# Patient Record
Sex: Female | Born: 1950 | Race: White | Hispanic: No | Smoking: Never smoker
Health system: Southern US, Community
[De-identification: ages and names within clinical notes are randomized; demographics above are authoritative.]

## PROBLEM LIST (undated history)

## (undated) DIAGNOSIS — N390 Urinary tract infection, site not specified: Secondary | ICD-10-CM

## (undated) DIAGNOSIS — E119 Type 2 diabetes mellitus without complications: Secondary | ICD-10-CM

## (undated) DIAGNOSIS — G238 Other specified degenerative diseases of basal ganglia: Secondary | ICD-10-CM

## (undated) DIAGNOSIS — K5731 Diverticulosis of large intestine without perforation or abscess with bleeding: Secondary | ICD-10-CM

## (undated) DIAGNOSIS — I509 Heart failure, unspecified: Secondary | ICD-10-CM

## (undated) DIAGNOSIS — H9190 Unspecified hearing loss, unspecified ear: Secondary | ICD-10-CM

## (undated) DIAGNOSIS — M1990 Unspecified osteoarthritis, unspecified site: Secondary | ICD-10-CM

## (undated) DIAGNOSIS — Z87442 Personal history of urinary calculi: Secondary | ICD-10-CM

## (undated) DIAGNOSIS — N12 Tubulo-interstitial nephritis, not specified as acute or chronic: Secondary | ICD-10-CM

## (undated) DIAGNOSIS — M5030 Other cervical disc degeneration, unspecified cervical region: Secondary | ICD-10-CM

## (undated) DIAGNOSIS — S31809A Unspecified open wound of unspecified buttock, initial encounter: Secondary | ICD-10-CM

## (undated) DIAGNOSIS — K5900 Constipation, unspecified: Secondary | ICD-10-CM

## (undated) DIAGNOSIS — R6521 Severe sepsis with septic shock: Secondary | ICD-10-CM

## (undated) DIAGNOSIS — E785 Hyperlipidemia, unspecified: Secondary | ICD-10-CM

## (undated) DIAGNOSIS — I10 Essential (primary) hypertension: Secondary | ICD-10-CM

## (undated) DIAGNOSIS — G231 Progressive supranuclear ophthalmoplegia [Steele-Richardson-Olszewski]: Secondary | ICD-10-CM

## (undated) DIAGNOSIS — E1142 Type 2 diabetes mellitus with diabetic polyneuropathy: Secondary | ICD-10-CM

## (undated) DIAGNOSIS — Z973 Presence of spectacles and contact lenses: Secondary | ICD-10-CM

## (undated) DIAGNOSIS — F32A Depression, unspecified: Secondary | ICD-10-CM

## (undated) DIAGNOSIS — R202 Paresthesia of skin: Secondary | ICD-10-CM

## (undated) DIAGNOSIS — J9690 Respiratory failure, unspecified, unspecified whether with hypoxia or hypercapnia: Secondary | ICD-10-CM

## (undated) DIAGNOSIS — M4802 Spinal stenosis, cervical region: Secondary | ICD-10-CM

## (undated) DIAGNOSIS — N136 Pyonephrosis: Secondary | ICD-10-CM

## (undated) DIAGNOSIS — K219 Gastro-esophageal reflux disease without esophagitis: Secondary | ICD-10-CM

## (undated) DIAGNOSIS — F329 Major depressive disorder, single episode, unspecified: Secondary | ICD-10-CM

## (undated) DIAGNOSIS — R350 Frequency of micturition: Secondary | ICD-10-CM

## (undated) DIAGNOSIS — H04123 Dry eye syndrome of bilateral lacrimal glands: Secondary | ICD-10-CM

## (undated) DIAGNOSIS — N393 Stress incontinence (female) (male): Secondary | ICD-10-CM

## (undated) DIAGNOSIS — IMO0001 Reserved for inherently not codable concepts without codable children: Secondary | ICD-10-CM

## (undated) DIAGNOSIS — G20 Parkinson's disease: Secondary | ICD-10-CM

## (undated) DIAGNOSIS — M5416 Radiculopathy, lumbar region: Secondary | ICD-10-CM

## (undated) DIAGNOSIS — R32 Unspecified urinary incontinence: Secondary | ICD-10-CM

## (undated) DIAGNOSIS — E876 Hypokalemia: Secondary | ICD-10-CM

## (undated) DIAGNOSIS — R42 Dizziness and giddiness: Secondary | ICD-10-CM

## (undated) DIAGNOSIS — N201 Calculus of ureter: Secondary | ICD-10-CM

## (undated) DIAGNOSIS — R200 Anesthesia of skin: Secondary | ICD-10-CM

## (undated) DIAGNOSIS — N179 Acute kidney failure, unspecified: Secondary | ICD-10-CM

## (undated) DIAGNOSIS — G4733 Obstructive sleep apnea (adult) (pediatric): Secondary | ICD-10-CM

## (undated) DIAGNOSIS — R001 Bradycardia, unspecified: Secondary | ICD-10-CM

## (undated) DIAGNOSIS — M5020 Other cervical disc displacement, unspecified cervical region: Secondary | ICD-10-CM

## (undated) DIAGNOSIS — G20A1 Parkinson's disease without dyskinesia, without mention of fluctuations: Secondary | ICD-10-CM

## (undated) HISTORY — DX: Other specified degenerative diseases of basal ganglia: G23.8

## (undated) HISTORY — DX: Essential (primary) hypertension: I10

## (undated) HISTORY — PX: COLONOSCOPY: SHX174

---

## 1983-11-13 HISTORY — PX: TUBAL LIGATION: SHX77

## 1999-01-26 ENCOUNTER — Other Ambulatory Visit: Admission: RE | Admit: 1999-01-26 | Discharge: 1999-01-26 | Payer: Self-pay | Admitting: Obstetrics and Gynecology

## 2000-02-16 ENCOUNTER — Encounter: Admission: RE | Admit: 2000-02-16 | Discharge: 2000-02-16 | Payer: Self-pay | Admitting: Obstetrics and Gynecology

## 2000-02-16 ENCOUNTER — Other Ambulatory Visit: Admission: RE | Admit: 2000-02-16 | Discharge: 2000-02-16 | Payer: Self-pay | Admitting: Obstetrics and Gynecology

## 2000-02-16 ENCOUNTER — Encounter: Payer: Self-pay | Admitting: Obstetrics and Gynecology

## 2001-02-24 ENCOUNTER — Other Ambulatory Visit: Admission: RE | Admit: 2001-02-24 | Discharge: 2001-02-24 | Payer: Self-pay | Admitting: Obstetrics and Gynecology

## 2001-03-10 ENCOUNTER — Other Ambulatory Visit: Admission: RE | Admit: 2001-03-10 | Discharge: 2001-03-10 | Payer: Self-pay | Admitting: Obstetrics and Gynecology

## 2001-03-10 ENCOUNTER — Encounter: Admission: RE | Admit: 2001-03-10 | Discharge: 2001-03-10 | Payer: Self-pay | Admitting: Obstetrics and Gynecology

## 2001-03-10 ENCOUNTER — Encounter (INDEPENDENT_AMBULATORY_CARE_PROVIDER_SITE_OTHER): Payer: Self-pay

## 2001-03-10 ENCOUNTER — Encounter: Payer: Self-pay | Admitting: Obstetrics and Gynecology

## 2002-06-09 ENCOUNTER — Other Ambulatory Visit: Admission: RE | Admit: 2002-06-09 | Discharge: 2002-06-09 | Payer: Self-pay | Admitting: Obstetrics and Gynecology

## 2002-09-25 ENCOUNTER — Encounter: Payer: Self-pay | Admitting: Obstetrics and Gynecology

## 2002-09-25 ENCOUNTER — Encounter: Admission: RE | Admit: 2002-09-25 | Discharge: 2002-09-25 | Payer: Self-pay | Admitting: Obstetrics and Gynecology

## 2003-07-30 ENCOUNTER — Other Ambulatory Visit: Admission: RE | Admit: 2003-07-30 | Discharge: 2003-07-30 | Payer: Self-pay | Admitting: Obstetrics and Gynecology

## 2004-06-06 ENCOUNTER — Encounter: Admission: RE | Admit: 2004-06-06 | Discharge: 2004-06-06 | Payer: Self-pay | Admitting: Obstetrics and Gynecology

## 2004-06-26 ENCOUNTER — Encounter: Admission: RE | Admit: 2004-06-26 | Discharge: 2004-06-26 | Payer: Self-pay | Admitting: Obstetrics and Gynecology

## 2005-12-31 ENCOUNTER — Encounter: Admission: RE | Admit: 2005-12-31 | Discharge: 2005-12-31 | Payer: Self-pay | Admitting: Obstetrics and Gynecology

## 2007-02-14 ENCOUNTER — Encounter: Admission: RE | Admit: 2007-02-14 | Discharge: 2007-02-14 | Payer: Self-pay | Admitting: Family Medicine

## 2007-09-22 ENCOUNTER — Observation Stay (HOSPITAL_COMMUNITY): Admission: AD | Admit: 2007-09-22 | Discharge: 2007-09-23 | Payer: Self-pay | Admitting: *Deleted

## 2007-09-23 ENCOUNTER — Encounter (INDEPENDENT_AMBULATORY_CARE_PROVIDER_SITE_OTHER): Payer: Self-pay | Admitting: *Deleted

## 2007-09-23 HISTORY — PX: TRANSTHORACIC ECHOCARDIOGRAM: SHX275

## 2007-09-30 HISTORY — PX: CARDIOVASCULAR STRESS TEST: SHX262

## 2007-11-22 ENCOUNTER — Ambulatory Visit (HOSPITAL_BASED_OUTPATIENT_CLINIC_OR_DEPARTMENT_OTHER): Admission: RE | Admit: 2007-11-22 | Discharge: 2007-11-22 | Payer: Self-pay | Admitting: *Deleted

## 2007-11-30 ENCOUNTER — Ambulatory Visit: Payer: Self-pay | Admitting: Internal Medicine

## 2008-02-06 DIAGNOSIS — I5189 Other ill-defined heart diseases: Secondary | ICD-10-CM | POA: Insufficient documentation

## 2008-02-27 ENCOUNTER — Encounter: Admission: RE | Admit: 2008-02-27 | Discharge: 2008-02-27 | Payer: Self-pay | Admitting: Family Medicine

## 2009-03-04 ENCOUNTER — Encounter: Admission: RE | Admit: 2009-03-04 | Discharge: 2009-03-04 | Payer: Self-pay | Admitting: Family Medicine

## 2009-03-25 ENCOUNTER — Encounter: Admission: RE | Admit: 2009-03-25 | Discharge: 2009-03-25 | Payer: Self-pay | Admitting: Family Medicine

## 2009-06-03 DIAGNOSIS — L259 Unspecified contact dermatitis, unspecified cause: Secondary | ICD-10-CM

## 2009-09-23 DIAGNOSIS — F329 Major depressive disorder, single episode, unspecified: Secondary | ICD-10-CM

## 2009-09-23 DIAGNOSIS — M899 Disorder of bone, unspecified: Secondary | ICD-10-CM

## 2009-09-23 DIAGNOSIS — M949 Disorder of cartilage, unspecified: Secondary | ICD-10-CM

## 2010-07-26 DIAGNOSIS — I10 Essential (primary) hypertension: Secondary | ICD-10-CM

## 2010-07-28 DIAGNOSIS — E1169 Type 2 diabetes mellitus with other specified complication: Secondary | ICD-10-CM

## 2010-07-28 DIAGNOSIS — E782 Mixed hyperlipidemia: Secondary | ICD-10-CM

## 2010-07-28 DIAGNOSIS — E559 Vitamin D deficiency, unspecified: Secondary | ICD-10-CM

## 2010-08-23 DIAGNOSIS — E119 Type 2 diabetes mellitus without complications: Secondary | ICD-10-CM

## 2010-09-08 DIAGNOSIS — J309 Allergic rhinitis, unspecified: Secondary | ICD-10-CM | POA: Insufficient documentation

## 2010-12-03 ENCOUNTER — Encounter: Payer: Self-pay | Admitting: Family Medicine

## 2010-12-04 ENCOUNTER — Encounter: Payer: Self-pay | Admitting: Family Medicine

## 2011-03-27 NOTE — Procedures (Signed)
NAMESTEPHEN, [name] [name]             ACCOUNT NO.:  [id number]   MEDICAL RECORD NO.:  [id number]          PATIENT TYPE:  OUT   LOCATION:  SLEEP CENTER                 FACILITY:  [facility]   PHYSICIAN:  Clinton D. Maple Hudson, MD, FCCP, FACPDATE OF BIRTH:  07-15-1951   DATE OF STUDY:  11/22/2007                            NOCTURNAL POLYSOMNOGRAM   REFERRING PHYSICIAN:  Rosine Abe, M.D.   INDICATION FOR STUDY:  Hypersomnia with sleep apnea.   EPWORTH SLEEPINESS SCORE:  12/24, BMI 34, weight 262 pounds, height 62  inches.   HOME MEDICATIONS:  Charted and reviewed.   SLEEP ARCHITECTURE:  Split study protocol.  During the diagnostic phase,  total sleep time was 155 minutes with sleep efficiency 77%.  Stage I was  2.6%, stage II 76.8%, stage III absent, REM 20.6% of total sleep time.  Sleep efficiency 77.5%.  Sleep latency 29.5 minutes.  REM latency 120  minutes.  Awake after sleep onset 3.5 minutes.  Arousal index 7.  No  bedtime medication was taken.   RESPIRATORY DATA:  Split study protocol.  Apnea/hypopnea index (AHI) 36  per hour.  This included 2 obstructive apneas and 91 hypopneas before  CPAP.  The events were not positional.  CPAP was titrated to 15-CWP, AHI  1.2 per hour.  The patient preferred a medium Mirage Quattro mask with  heated humidifier.   OXYGEN DATA:  Very loud snoring with oxygen desaturation to a nadir of  71% during the diagnostic phase.  After CPAP control, saturation held  92% on room air.   CARDIAC DATA:  Normal sinus rhythm.   MOVEMENT-PARASOMNIA:  No significant movement disturbance.  No bathroom  trips.   IMPRESSIONS-RECOMMENDATIONS:  1. Moderate obstructive sleep apnea/hypopnea syndrome, apnea/hypopnea      index 36 per hour.  Events were not positional.  Very loud snoring      with oxygen desaturation to a nadir of 71%.  2. Successful CPAP titration to 15-CWP, apnea/hypopnea index 1.2 per      hour.  A medium Mirage Quattro mask was used with heated    humidifier.      Clinton D. Maple Hudson, MD, [facility], FACP  Diplomate, Biomedical engineer of Sleep Medicine  Electronically Signed     CDY/MEDQ  D:  11/30/2007 14:57:12  T:  11/30/2007 15:45:37  Job:  [id number]

## 2011-03-27 NOTE — H&P (Signed)
NAMESHEVY, YANEY             ACCOUNT NO.:  [id number]   MEDICAL RECORD NO.:  [id number]          PATIENT TYPE:  INP   LOCATION:  4708                         FACILITY:  [facility]   PHYSICIAN:  Elmore Guise., M.D.DATE OF BIRTH:  Sep 10, 1951   DATE OF ADMISSION:  09/22/2007  DATE OF DISCHARGE:                              HISTORY & PHYSICAL   INDICATION FOR ADMISSION:  Dyspnea   REFERRING PHYSICIAN:  Dr. Maryelizabeth Rowan, [facility].   HISTORY OF PRESENT ILLNESS:  Mrs. Bacote is a 60 year old white female,  past medical history of hypertension, obesity, dyslipidemia and family  history of heart disease who presents with 2-week history of increasing  shortness of breath, orthopnea, PND and weight gain.  She also notes  over the past week increasing exertional chest tightness which she  describes as a heaviness and pressure.  It is worse with activity,  improved with rest.  She was seen at [facility].  She was started on  Lasix and potassium.  She does report that she lost a little bit of  weight; however, still is limited because of her extreme shortness of  breath.  She now can take approximately five steps and then she would be  totally wiped out.  She denies any recent trips with prolonged periods  of inactivity.  She has had no fever.  She does have a nonproductive  cough.  She has significant orthopnea, sitting upright in a recliner  chair.  She also notes some palpitations off and on as well as episodes  of presyncope.  She has had no vomiting or diarrhea.  She does report  that she has been feeling flushed with decreased exertional tolerance.   REVIEW OF SYSTEMS:  Are as per HPI.  All others are negative.   CURRENT MEDICATIONS:  1. Benicar/hydrochlorothiazide 40/25 mg daily.  2. Aspirin 81 mg daily.  3. Caltrate with vitamin D once daily.  4. Fexofenadine 180 mg daily.  5. Lasix 40 mg daily.  6. Potassium 20 mEq daily.  7. Multivitamin once  daily.  8. Paxil 20 mg daily.  9. Vitamin E 400 units daily.   ALLERGIES:  None.   FAMILY HISTORY:  Positive for heart disease with her father.  Her mother  died at age 34 from a stroke.   SOCIAL HISTORY:  She is an Print production planner.  No tobacco, rare alcohol  use.  She does drink four cups of coffee daily.  She is married.   PAST SURGICAL HISTORY:  Tubal ligation in 1984.   EXAMINATION:  Her weight is 262 pounds.  Her blood pressure is 118/88,  her heart rate is 88 and regular.  GENERAL:  She is a very pleasant middle-aged white female, alert and  oriented x4 in no acute distress.  HEENT:  Normal.  NECK:  Supple.  No lymphadenopathy, 2+ carotids with JVD to the angle of  the jaw.  LUNGS:  Have fine bibasilar crackles.  HEART:  Regular with a very soft flow murmur and soft S4 noted.  ABDOMEN:  Soft, nontender, nondistended.  No rebound or guarding,  obese.  EXTREMITIES:  Warm with 2+ pulses and 1+ pretibial edema.   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  She  has nonspecific ST-T wave changes.  Her blood work done at an outside  office shows a white blood cell count of 7.8, hemoglobin of 14.6,  platelet count of 269.  She has got a BUN and creatinine of 11 and 0.5,  potassium level of 4.3, and normal LFTs.   IMPRESSION:  1. Mild volume overload and dyspnea consistent with heart failure.  2. Increasing exertional chest pain.  3. Hypertension.  4. History of dyslipidemia.   PLAN:  At this time the patient has been treated with outpatient Lasix.  She does report minimal improvement.  I do recommend hospital admission  to fully evaluate her symptoms.  We will treat her volume overload with  Lasix 40 mg IV twice daily.  She will have serial cardiac enzymes  performed.  She will also have an echocardiogram done.   We will continue  her Benicar, aspirin, start Lovenox, and will add beta blocker after her  acute decompensation is improved.  I did discuss with her once she is  able to lie flat and her volume status is improved, an ischemic  evaluation will be needed.  The patient understands this.  All her  questions were answered.      Elmore Guise., M.D.  Electronically Signed     TWK/MEDQ  D:  09/22/2007  T:  09/23/2007  Job:  [id number]   cc:   Maryelizabeth Rowan, M.D.

## 2011-03-30 NOTE — Discharge Summary (Signed)
[name] [name], [name] [name]             ACCOUNT NO.:  [id number]   MEDICAL RECORD NO.:  [id number]          PATIENT TYPE:  INP   LOCATION:  4708                         FACILITY:  [facility]   PHYSICIAN:  Elmore Guise., M.D.DATE OF BIRTH:  20-May-1951   DATE OF ADMISSION:  09/22/2007  DATE OF DISCHARGE:  09/23/2007                               DISCHARGE SUMMARY   DISCHARGE DIAGNOSES:  1. Diastolic dysfunction.  2. Hypertension  3. Obesity.   HISTORY OF PRESENT ILLNESS:  Ms. Edgington presented with increasing  shortness of breath and chest discomfort for the last couple of weeks.  She was placed on Lasix as an outpatient with minimal response.  She was  admitted for IV diuresis.   HOSPITAL COURSE:  The patient's hospital course was uncomplicated.  She  was given one dose of IV Lasix with significant diuresis of 1.5 liters.  Her breathing improved significantly.  She was also started on Ranexa to  help with diastolic dysfunction.  She had an echocardiogram performed  which showed a pseudonormal LV filling pattern consistent with diastolic  dysfunction.  She had hyperdynamic LV function with an EF of 65-70%.  She had no valvular abnormalities.  She has now been up and ambulatory,  which she has not been able to do prior to her hospitalization.  She has  no significant shortness of breath.  She has had no further chest pain.  Her cardiac markers were negative and her BNP is normal.  She will be  discharged on the following medications.  1. Benicar/HCTZ 40/25 mg once daily.  2. Paxil 20 mg daily.  3. Aspirin 81 mg daily.  4. Allegra 180 mg daily.  5. Lasix 40 mg daily.  6. Potassium 20 mEq daily.  7. She was also discharged on Ranexa 500 mg two times daily.  She is      to come by the office for samples of this medication.   PLAN:  She will be seen in the office in one week and set up for an  adenosine with limited exercise to evaluate for any ischemic heart  disease.  If she has any  further problems, she is to give me a call at  the office.  She should have routine heart failure restrictions  including avoidance of salt products, daily weights, if she has any  problems with weight gain more than 2 pounds in 48 hours, she is take an  extra Lasix dose.  All her questions were answered prior to discharge.     Elmore Guise., M.D.  Electronically Signed    TWK/MEDQ  D:  09/24/2007  T:  09/25/2007  Job:  [id number]   cc:   Maryelizabeth Rowan, M.D.

## 2011-08-21 LAB — PROTIME-INR
INR: 1
Prothrombin Time: 13.1

## 2011-08-21 LAB — CARDIAC PANEL(CRET KIN+CKTOT+MB+TROPI)
CK, MB: 1.8
Relative Index: 1.4
Total CK: 96
Troponin I: 0.01
Troponin I: 0.02

## 2011-08-21 LAB — APTT: aPTT: 25

## 2011-08-21 LAB — COMPREHENSIVE METABOLIC PANEL
Chloride: 95 — ABNORMAL LOW
Creatinine, Ser: 0.66
GFR calc Af Amer: >60
Glucose, Bld: 133 — ABNORMAL HIGH
Potassium: 3.9
Total Bilirubin: 0.9

## 2011-08-21 LAB — CBC
HCT: 43.1
MCHC: 33.5
MCV: 88.6
Platelets: 265
RBC: 4.86
WBC: 10.6 — ABNORMAL HIGH

## 2011-08-21 LAB — DIFFERENTIAL
Basophils Absolute: 0
Basophils Relative: 0
Lymphs Abs: 2.6
Monocytes Relative: 4
Neutrophils Relative %: 69

## 2011-08-21 LAB — BASIC METABOLIC PANEL
BUN: 21
CO2: 30
Calcium: 8.9
Creatinine, Ser: 0.88
GFR calc non Af Amer: >60
Glucose, Bld: 144 — ABNORMAL HIGH

## 2011-08-21 LAB — DIGOXIN LEVEL: Digoxin Level: <0.2 — ABNORMAL LOW

## 2012-02-26 ENCOUNTER — Other Ambulatory Visit: Payer: Self-pay | Admitting: Family Medicine

## 2012-02-26 DIAGNOSIS — Z1231 Encounter for screening mammogram for malignant neoplasm of breast: Secondary | ICD-10-CM

## 2012-03-12 ENCOUNTER — Ambulatory Visit: Payer: Self-pay

## 2012-03-14 ENCOUNTER — Ambulatory Visit
Admission: RE | Admit: 2012-03-14 | Discharge: 2012-03-14 | Disposition: A | Discharge status: Home / Self Care | Payer: BC Managed Care – PPO | Source: Ambulatory Visit | Attending: Family Medicine | Admitting: Family Medicine

## 2012-03-14 DIAGNOSIS — Z1231 Encounter for screening mammogram for malignant neoplasm of breast: Secondary | ICD-10-CM

## 2012-03-25 ENCOUNTER — Encounter: Payer: Self-pay | Admitting: *Deleted

## 2012-07-01 ENCOUNTER — Other Ambulatory Visit: Payer: Self-pay | Admitting: Family Medicine

## 2012-07-01 DIAGNOSIS — Z780 Asymptomatic menopausal state: Secondary | ICD-10-CM

## 2012-07-29 ENCOUNTER — Encounter: Payer: Self-pay | Admitting: *Deleted

## 2012-08-12 ENCOUNTER — Other Ambulatory Visit: Payer: Self-pay | Admitting: Urology

## 2012-08-13 ENCOUNTER — Other Ambulatory Visit: Payer: BC Managed Care – PPO

## 2012-08-14 ENCOUNTER — Encounter (HOSPITAL_COMMUNITY): Payer: Self-pay | Admitting: *Deleted

## 2012-08-14 NOTE — Progress Notes (Signed)
1437 Checked with Chasty 08-13-12 1630 re: antibiotic order she checked and said that he did not have one ordered

## 2012-08-14 NOTE — Pre-Procedure Instructions (Addendum)
Asked to bring blue folder the day of the procedure,insurance card,I.D. driver's license,wear comfortable clothing and have a driver for the day. Asked not to take Advil,Motrin,Ibuprofen,Aleve or any NSAIDS, Aspirin, or Toradol for 72 hours prior to procedure,  No vitamins or herbal medications 7 days prior to procedure. Stopped Aspirin,Calcium,Vitamin today. Instructed to take laxative per doctor's office instructions and eat a light dinner the evening before procedure.   To arrive at 0530 for lithotripsy procedure.  Asked not to take diabetic med the morning of surgery.

## 2012-08-18 ENCOUNTER — Ambulatory Visit (HOSPITAL_COMMUNITY)
Admission: RE | Admit: 2012-08-18 | Discharge: 2012-08-18 | Disposition: A | Discharge status: Home / Self Care | Payer: BC Managed Care – PPO | Source: Ambulatory Visit | Attending: Urology | Admitting: Urology

## 2012-08-18 ENCOUNTER — Ambulatory Visit (HOSPITAL_COMMUNITY): Payer: BC Managed Care – PPO

## 2012-08-18 ENCOUNTER — Encounter (HOSPITAL_COMMUNITY)
Admission: RE | Disposition: A | Discharge status: Home / Self Care | Payer: Self-pay | Source: Ambulatory Visit | Attending: Urology

## 2012-08-18 ENCOUNTER — Encounter (HOSPITAL_COMMUNITY): Payer: Self-pay | Admitting: *Deleted

## 2012-08-18 DIAGNOSIS — I10 Essential (primary) hypertension: Secondary | ICD-10-CM | POA: Insufficient documentation

## 2012-08-18 DIAGNOSIS — G4730 Sleep apnea, unspecified: Secondary | ICD-10-CM | POA: Insufficient documentation

## 2012-08-18 DIAGNOSIS — Z79899 Other long term (current) drug therapy: Secondary | ICD-10-CM | POA: Insufficient documentation

## 2012-08-18 DIAGNOSIS — E119 Type 2 diabetes mellitus without complications: Secondary | ICD-10-CM | POA: Insufficient documentation

## 2012-08-18 DIAGNOSIS — N200 Calculus of kidney: Secondary | ICD-10-CM | POA: Insufficient documentation

## 2012-08-18 HISTORY — DX: Depression, unspecified: F32.A

## 2012-08-18 HISTORY — DX: Unspecified osteoarthritis, unspecified site: M19.90

## 2012-08-18 HISTORY — PX: EXTRACORPOREAL SHOCK WAVE LITHOTRIPSY: SHX1557

## 2012-08-18 HISTORY — DX: Major depressive disorder, single episode, unspecified: F32.9

## 2012-08-18 SURGERY — LITHOTRIPSY, ESWL
Anesthesia: LOCAL | Laterality: Right

## 2012-08-18 MED ORDER — MORPHINE SULFATE 10 MG/ML IJ SOLN: 1.0000 mg | INTRAMUSCULAR | Status: DC | PRN

## 2012-08-18 MED ORDER — SODIUM CHLORIDE 0.9 % IV SOLN: 250.0000 mL | INTRAVENOUS | Status: DC | PRN

## 2012-08-18 MED ORDER — ACETAMINOPHEN 650 MG RE SUPP
650.0000 mg | RECTAL | Status: DC | PRN
  Filled 2012-08-18: qty 1

## 2012-08-18 MED ORDER — ACETAMINOPHEN 325 MG PO TABS: 650.0000 mg | ORAL_TABLET | ORAL | Status: DC | PRN

## 2012-08-18 MED ORDER — ONDANSETRON HCL 4 MG/2ML IJ SOLN: 4.0000 mg | Freq: Four times a day (QID) | INTRAMUSCULAR | Status: DC | PRN

## 2012-08-18 MED ORDER — OXYCODONE HCL 5 MG PO TABS: 5.0000 mg | ORAL_TABLET | ORAL | Status: DC | PRN

## 2012-08-18 MED ORDER — ACETAMINOPHEN 10 MG/ML IV SOLN
1000.0000 mg | Freq: Four times a day (QID) | INTRAVENOUS | Status: DC
  Administered 2012-08-18: 1000 mg via INTRAVENOUS
  Filled 2012-08-18: qty 100

## 2012-08-18 MED ORDER — SODIUM CHLORIDE 0.9 % IJ SOLN: 3.0000 mL | INTRAMUSCULAR | Status: DC | PRN

## 2012-08-18 MED ORDER — DEXTROSE-NACL 5-0.45 % IV SOLN
INTRAVENOUS | Status: DC
  Administered 2012-08-18: 1000 mL via INTRAVENOUS

## 2012-08-18 MED ORDER — ACETAMINOPHEN 10 MG/ML IV SOLN: 1000.0000 mg | Freq: Four times a day (QID) | INTRAVENOUS | Status: DC

## 2012-08-18 MED ORDER — SODIUM CHLORIDE 0.9 % IJ SOLN: 3.0000 mL | Freq: Two times a day (BID) | INTRAMUSCULAR | Status: DC

## 2012-08-18 NOTE — H&P (Signed)
H&P  Chief Complaint: Kidney stonne  History of Present Illness: [name] [name] is a 61 y.o. year old female who presents for ESL. Her history is as follows:   This 61 year old female comes in today for treatment of a right ureteropelvic junction/renal pelvic stone. This is been symptomatic now for about 4 weeks. Symptoms are typical of a stone-colicky pain, some nausea. She has had no fever, chills, gross hematuria. She has no long-standing history of kidney stones. The stone was seen on a CT scan, and in retrospect was present on a KUB.    Past Medical History  Diagnosis Date  . HTN (hypertension)   . Diastolic dysfunction   . DM (diabetes mellitus)   . Sleep apnea     no tratment since 2010 loss weight  . Chronic kidney disease     kidney stone  . Headache     migraines usually twice a year  . Arthritis     fingers,right shoulder  . Depression     Past Surgical History  Procedure Date  . Tubal ligation 1984    Home Medications:  Medications Prior to Admission  Medication Sig Dispense Refill  . DULoxetine (CYMBALTA) 60 MG capsule Take 60 mg by mouth daily. Dr. Maryelizabeth Rowan      . fexofenadine (ALLEGRA) 180 MG tablet Take 180 mg by mouth daily.      . metFORMIN (GLUCOPHAGE) 500 MG tablet Take 500 mg by mouth daily with breakfast.      . olmesartan-hydrochlorothiazide (BENICAR HCT) 40-25 MG per tablet Take 1 tablet by mouth daily.      Marland Kitchen aspirin 81 MG tablet Take 81 mg by mouth daily.      . Calcium Carbonate-Vitamin D (CALTRATE 600+D PO) Take by mouth daily.      Marland Kitchen ezetimibe (ZETIA) 10 MG tablet Take 10 mg by mouth daily.      . furosemide (LASIX) 40 MG tablet Take 20 mg by mouth daily.      Marland Kitchen PARoxetine (PAXIL) 20 MG tablet Take 20 mg by mouth every morning.      . potassium chloride (MICRO-K) 10 MEQ CR capsule Take 10 mEq by mouth daily.      . vitamin E 400 UNIT capsule Take 400 Units by mouth daily.        Allergies:  Allergies  Allergen Reactions  .  Protonix (Pantoprazole Sodium)     Makes her feel wreidd    Family History  Problem Relation Age of Onset  . Hypertension    . Stroke      Social History:  reports that she has never smoked. She does not have any smokeless tobacco history on file. She reports that she drinks about 1.2 ounces of alcohol per week. She reports that she does not use illicit drugs.  ROS: A complete review of systems was performed.  All systems are negative except for pertinent findings as noted.  Physical Exam:  Vital signs in last 24 hours: Temp:  [97.8 F (36.6 C)] 97.8 F (36.6 C) (10/07 0534) Pulse Rate:  [85] 85  (10/07 0534) Resp:  [16] 16  (10/07 0534) BP: (122)/(72) 122/72 mmHg (10/07 0534) SpO2:  [94 %] 94 % (10/07 0534) Weight:  [114.93 kg (253 lb 6 oz)] 114.93 kg (253 lb 6 oz) (10/07 0534) General:  Alert and oriented, No acute distress HEENT: Normocephalic, atraumatic Neck: No JVD or lymphadenopathy Cardiovascular: Regular rate and rhythm Lungs: Clear bilaterally Abdomen: Soft, nontender, nondistended, no abdominal  masses Back: No CVA tenderness Extremities: No edema Neurologic: Grossly intact  Laboratory Data:  Results for orders placed during the hospital encounter of 08/18/12 (from the past 24 hour(s))  GLUCOSE, CAPILLARY     Status: Abnormal   Collection Time   08/18/12  6:44 AM      Component Value Range   Glucose-Capillary 140 (*) 70 - 99 mg/dL   No results found for this or any previous visit (from the past 240 hour(s)). Creatinine: No results found for this basename: CREATININE:7 in the last 168 hours  Radiologic Imaging: Dg Abd 1 View - Kub  08/18/2012  *RADIOLOGY REPORT*  Clinical Data: 61 year old female preoperative study for right kidney stone.  ABDOMEN - 1 VIEW  Comparison: None.  Findings: Two views of the abdomen and pelvis.  Indistinct right upper pole region right side calculus measuring 4-5 mm is evident. There may also be a more linear right mid pole  calculus.  Large right lateral endplate osteophytes in the lumbar spine at L2-L3. No definite additional urologic calculus.  Bulky left lumbar endplate osteophytes at L4-L5. No acute osseous abnormality identified.  IMPRESSION: Right middle and upper pole nephrolithiasis.   Original Report Authenticated By: Harley Hallmark, M.D.     Impression/Assessment:  Symptomatic right renal stone   Plan:  Right ESL  [name] 08/18/2012, 7:32 AM  Bertram Millard. Antaeus Karel MD

## 2012-10-03 ENCOUNTER — Ambulatory Visit
Admission: RE | Admit: 2012-10-03 | Discharge: 2012-10-03 | Disposition: A | Discharge status: Home / Self Care | Payer: BC Managed Care – PPO | Source: Ambulatory Visit | Attending: Family Medicine | Admitting: Family Medicine

## 2012-10-03 DIAGNOSIS — Z780 Asymptomatic menopausal state: Secondary | ICD-10-CM

## 2012-10-14 DIAGNOSIS — M810 Age-related osteoporosis without current pathological fracture: Secondary | ICD-10-CM | POA: Insufficient documentation

## 2013-03-13 ENCOUNTER — Other Ambulatory Visit: Payer: Self-pay

## 2013-03-13 DIAGNOSIS — Z1231 Encounter for screening mammogram for malignant neoplasm of breast: Secondary | ICD-10-CM

## 2013-05-06 ENCOUNTER — Ambulatory Visit: Payer: BC Managed Care – PPO

## 2013-08-26 ENCOUNTER — Ambulatory Visit
Admission: RE | Admit: 2013-08-26 | Discharge: 2013-08-26 | Disposition: A | Discharge status: Home / Self Care | Payer: BC Managed Care – PPO | Source: Ambulatory Visit

## 2013-08-26 DIAGNOSIS — Z1231 Encounter for screening mammogram for malignant neoplasm of breast: Secondary | ICD-10-CM

## 2013-10-14 ENCOUNTER — Other Ambulatory Visit: Payer: Self-pay | Admitting: Family Medicine

## 2013-10-14 DIAGNOSIS — R42 Dizziness and giddiness: Secondary | ICD-10-CM

## 2013-10-14 DIAGNOSIS — M549 Dorsalgia, unspecified: Secondary | ICD-10-CM

## 2013-10-22 ENCOUNTER — Ambulatory Visit
Admission: RE | Admit: 2013-10-22 | Discharge: 2013-10-22 | Disposition: A | Discharge status: Home / Self Care | Payer: BC Managed Care – PPO | Source: Ambulatory Visit | Attending: Family Medicine | Admitting: Family Medicine

## 2013-10-22 DIAGNOSIS — M549 Dorsalgia, unspecified: Secondary | ICD-10-CM

## 2013-10-22 DIAGNOSIS — R42 Dizziness and giddiness: Secondary | ICD-10-CM

## 2013-11-24 ENCOUNTER — Ambulatory Visit (INDEPENDENT_AMBULATORY_CARE_PROVIDER_SITE_OTHER): Payer: BC Managed Care – PPO | Admitting: Diagnostic Neuroimaging

## 2013-11-24 ENCOUNTER — Encounter: Payer: Self-pay | Admitting: Diagnostic Neuroimaging

## 2013-11-24 ENCOUNTER — Encounter (INDEPENDENT_AMBULATORY_CARE_PROVIDER_SITE_OTHER): Payer: Self-pay

## 2013-11-24 VITALS — BP 133/87 | HR 87 | Ht 61.5 in | Wt 255.0 lb

## 2013-11-24 DIAGNOSIS — E1165 Type 2 diabetes mellitus with hyperglycemia: Secondary | ICD-10-CM

## 2013-11-24 DIAGNOSIS — IMO0001 Reserved for inherently not codable concepts without codable children: Secondary | ICD-10-CM

## 2013-11-24 DIAGNOSIS — R42 Dizziness and giddiness: Secondary | ICD-10-CM | POA: Insufficient documentation

## 2013-11-24 DIAGNOSIS — R292 Abnormal reflex: Secondary | ICD-10-CM

## 2013-11-24 DIAGNOSIS — G609 Hereditary and idiopathic neuropathy, unspecified: Secondary | ICD-10-CM

## 2013-11-24 DIAGNOSIS — IMO0002 Reserved for concepts with insufficient information to code with codable children: Secondary | ICD-10-CM

## 2013-11-24 DIAGNOSIS — M5416 Radiculopathy, lumbar region: Secondary | ICD-10-CM

## 2013-11-24 NOTE — Patient Instructions (Addendum)
I will check MRI cervical spine.  Try physical therapy.  Use a cane/walker to help reduce chance of falling down.

## 2013-11-24 NOTE — Progress Notes (Signed)
[facility]  PATIENT: [name] [name] DOB: May 02, 1951  REFERRING CLINICIAN: Ernie Hew HISTORY FROM: patient  REASON FOR VISIT: new consult   HISTORICAL  CHIEF COMPLAINT:  Chief Complaint  Patient presents with  . Dizziness    HISTORY OF PRESENT ILLNESS:   63 year old left-handed female with hypertension, diabetes, hypercholesteremia, depression, here for evaluation of 2 problems: First dizziness for past 6 months and second low back pain radiating to right leg for past 10 years.  Regarding dizziness patient reports 6 month history of intermittent balance and wavering sensation when she first stands up. If she bends over she feels off balance and tends to fall over. She denies any vertigo, double vision, nausea, vomiting, headache. Patient had some numbness and tingling in her feet and legs. She has some neck pain.  Regarding low back pain, this is been going on for at least 10 years. She has pain in her lower back, hips, mainly radiating to the right leg. She saw [facility] several years ago and went through physical therapy which helped a little bit. She has some intermittent leg cramping, and uses Tylenol as needed for the pain. Patient took Aleve over-the-counter but this caused a rash/allergy.  Patient also has significant stressors in her life, with the death of her husband in 07-14-13 and death of her father in 08/14/13.   REVIEW OF SYSTEMS: Full 14 system review of systems performed and notable only for fatigue swelling in legs anemia trouble swallowing itching moles incontinence diarrhea wheezing shortness of breath eye pain blurred vision easy bruising feeling hot flushing pointing cramps aching muscles allergies runny nose and sensitivity depression not and asleep decreased energy dizziness difficulty swallowing weakness restless legs insomnia.  ALLERGIES: Allergies  Allergen Reactions  . Nsaids   . Protonix [Pantoprazole Sodium]     Makes her feel wreidd  . Simvastatin     HOME MEDICATIONS: Outpatient Prescriptions Prior to Visit  Medication Sig Dispense Refill  . ezetimibe (ZETIA) 10 MG tablet Take 10 mg by mouth daily.      . metFORMIN (GLUCOPHAGE) 500 MG tablet Take 2,000 mg by mouth daily with breakfast.       . aspirin 81 MG tablet Take 81 mg by mouth daily.      . Calcium Carbonate-Vitamin D (CALTRATE 600+D PO) Take by mouth daily.      . DULoxetine (CYMBALTA) 60 MG capsule Take 60 mg by mouth daily. Dr. Rachell Cipro      . fexofenadine (ALLEGRA) 180 MG tablet Take 180 mg by mouth daily.      . furosemide (LASIX) 40 MG tablet Take 20 mg by mouth daily.      Marland Kitchen olmesartan-hydrochlorothiazide (BENICAR HCT) 40-25 MG per tablet Take 1 tablet by mouth daily.      . potassium chloride (MICRO-K) 10 MEQ CR capsule Take 10 mEq by mouth daily.      . vitamin E 400 UNIT capsule Take 400 Units by mouth daily.       No facility-administered medications prior to visit.    PAST MEDICAL HISTORY: Past Medical History  Diagnosis Date  . HTN (hypertension)   . Diastolic dysfunction   . DM (diabetes mellitus)   . Sleep apnea     no tratment since 2010 loss weight  . Chronic kidney disease     kidney stone  . Headache(784.0)     migraines usually twice a year  . Arthritis     fingers,right shoulder  .  Depression   . Sinusitis   . High cholesterol     PAST SURGICAL HISTORY: Past Surgical History  Procedure Laterality Date  . Tubal ligation  1985    FAMILY HISTORY: Family History  Problem Relation Age of Onset  . Hypertension    . Stroke    . Heart Problems Father     SOCIAL HISTORY:  History   Social History  . Marital Status: Widowed    Spouse Name: N/A    Number of Children: 1  . Years of Education: College   Occupational History  . office manager   .     Social History Main Topics  . Smoking status: Never Smoker   . Smokeless tobacco: Never Used  . Alcohol Use: 1.2 oz/week    2  Glasses of wine per week     Comment: rare/ 1-2 drinks monthly  . Drug Use: No  . Sexual Activity: Not on file   Other Topics Concern  . Not on file   Social History Narrative   Patient lives at home with her dog name "Hot Rod".   Caffeine Use: 4-5 cups of coffee daily     PHYSICAL EXAM  Filed Vitals:   11/24/13 1050 11/24/13 1051 11/24/13 1058  BP: 134/85 133/87   Pulse: 85 87   Height:   5' 1.5" (1.562 m)  Weight:   255 lb (115.667 kg)    Not recorded    Body mass index is 47.41 kg/(m^2).  GENERAL EXAM: Patient is in no distress; well developed, nourished and groomed; neck is supple  CARDIOVASCULAR: Regular rate and rhythm, no murmurs, no carotid bruits  NEUROLOGIC: MENTAL STATUS: awake, alert, oriented to person, place and time, recent and remote memory intact, normal attention and concentration, language fluent, comprehension intact, naming intact, fund of knowledge appropriate CRANIAL NERVE: no papilledema on fundoscopic exam, pupils equal and reactive to light, visual fields full to confrontation, extraocular muscles intact, no nystagmus, facial sensation and strength symmetric, hearing intact, palate elevates symmetrically, uvula midline, shoulder shrug symmetric, tongue midline. MOTOR: normal bulk and tone, full strength in the BUE, BLE SENSORY: ABSENT VIB AT TOES. DECR PP IN FEET.  COORDINATION: finger-nose-finger, fine finger movements normal REFLEXES: BUE 2, RIGHT KNEE 2, LEFT KNEE 3 (WITH SUPRAPATELLAR AND CROSSED ADDUCTORS), ANKLES 2 (LEFT SLIGHTLY INCR COMPARED TO RIGHT). DOWN GOING TOES. GAIT/STATION: SLOW TO RISE. CAUTIOUS, ANTALGIC GAIT. WIDE BASED. CANNOT STAND WITH FEET TOGETHER AND EYES OPEN.    DIAGNOSTIC DATA (LABS, IMAGING, TESTING) - I reviewed patient records, labs, notes, testing and imaging myself where available.  Lab Results  Component Value Date   WBC 10.6 **Please note change in CBC/Diff reference range** 09/22/2007   HGB 14.4  09/22/2007   HCT 43.1 09/22/2007   MCV 88.6 09/22/2007   PLT 265 09/22/2007      Component Value Date/Time   NA 132* 09/23/2007 0510   K 3.4* 09/23/2007 0510   CL 94* 09/23/2007 0510   CO2 30 09/23/2007 0510   GLUCOSE 144* 09/23/2007 0510   BUN 21 09/23/2007 0510   CREATININE 0.88 09/23/2007 0510   CALCIUM 8.9 09/23/2007 0510   PROT 7.5 09/22/2007 1716   ALBUMIN 3.9 09/22/2007 1716   AST 26 09/22/2007 1716   ALT 30 09/22/2007 1716   ALKPHOS 89 09/22/2007 1716   BILITOT 0.9 09/22/2007 1716   GFRNONAA >60 09/23/2007 0510   GFRAA  Value: >60        The eGFR has been calculated  using the MDRD equation. This calculation has not been validated in all clinical 09/23/2007 0510   No results found for this basename: CHOL, HDL, LDLCALC, LDLDIRECT, TRIG, CHOLHDL   No results found for this basename: HGBA1C   No results found for this basename: VITAMINB12   No results found for this basename: TSH    10/22/13 MRI BRAIN - Mild chronic microvascular ischemic change. No acute intracranial findings.  10/22/13 MRI LUMBAR - Potentially symptomatic neural encroachment at L5-S1 on the left, but there is no dominant right-sided abnormality.  I reviewed images myself and agree with interpretation. VRP   ASSESSMENT AND PLAN  63 y.o. year old female here with intermittent dizziness for past 6 months and low back pain for past 10 years.  DIZZINESS: Patient describes primary balance difficulty. Based on exam and symptoms, could represent cervical spinal stenosis, lumbar radiculopathy, neuropathy.  LOW BACK PAIN: Likely related to patient's obesity, degenerative spine disease and lumbar radiculopathy. Patient not interested in additional pain medications or steroid injections.  PLAN: Orders Placed This Encounter  Procedures  . MR Cervical Spine Wo Contrast  . Vitamin B12  . Hemoglobin A1c  . TSH  . Ambulatory referral to Physical Therapy   Return in about 3 months (around 02/22/2014) for  with Charlott Holler or Milik Gilreath.    Penni Bombard, MD 5/64/3329, 51:88 AM Certified in Neurology, Neurophysiology and Neuroimaging  [facility] [address] [place], [place] [address] [phone]

## 2013-12-01 ENCOUNTER — Other Ambulatory Visit (INDEPENDENT_AMBULATORY_CARE_PROVIDER_SITE_OTHER): Payer: Self-pay

## 2013-12-01 DIAGNOSIS — Z0289 Encounter for other administrative examinations: Secondary | ICD-10-CM

## 2013-12-02 LAB — HEMOGLOBIN A1C
Est. average glucose Bld gHb Est-mCnc: 183 mg/dL
HEMOGLOBIN A1C: 8 % — AB (ref 4.8–5.6)

## 2013-12-02 LAB — TSH: TSH: 2.36 u[IU]/mL (ref 0.450–4.500)

## 2013-12-02 LAB — VITAMIN B12: VITAMIN B 12: 318 pg/mL (ref 211–946)

## 2013-12-09 ENCOUNTER — Ambulatory Visit (INDEPENDENT_AMBULATORY_CARE_PROVIDER_SITE_OTHER): Payer: BC Managed Care – PPO

## 2013-12-09 DIAGNOSIS — M5416 Radiculopathy, lumbar region: Secondary | ICD-10-CM

## 2013-12-09 DIAGNOSIS — E1165 Type 2 diabetes mellitus with hyperglycemia: Secondary | ICD-10-CM

## 2013-12-09 DIAGNOSIS — G609 Hereditary and idiopathic neuropathy, unspecified: Secondary | ICD-10-CM

## 2013-12-09 DIAGNOSIS — IMO0001 Reserved for inherently not codable concepts without codable children: Secondary | ICD-10-CM

## 2013-12-09 DIAGNOSIS — R292 Abnormal reflex: Secondary | ICD-10-CM

## 2013-12-09 DIAGNOSIS — IMO0002 Reserved for concepts with insufficient information to code with codable children: Secondary | ICD-10-CM

## 2013-12-09 DIAGNOSIS — R42 Dizziness and giddiness: Secondary | ICD-10-CM

## 2013-12-16 ENCOUNTER — Telehealth: Payer: Self-pay | Admitting: Diagnostic Neuroimaging

## 2013-12-16 NOTE — Telephone Encounter (Signed)
Patient calling about results of recent MRI. Please call to advise.

## 2013-12-16 NOTE — Telephone Encounter (Signed)
Patient requesting MRI results   Please advise.

## 2013-12-17 NOTE — Telephone Encounter (Signed)
Pt called in stating she had an MRI a week ago and she has not heard anything.  She asked if she could get a call back today.  Her work number is [phone] and she will be there until 5:30 - 6:00 pm.  Thank you

## 2013-12-18 NOTE — Telephone Encounter (Signed)
Patient calling again to request MRI results, is anxious for results. Please call.

## 2013-12-21 NOTE — Telephone Encounter (Signed)
Called patient and let her know her results was sent to the doctor to review  And advise. On 12-21-2013@1 :43

## 2013-12-21 NOTE — Telephone Encounter (Signed)
Called patient. Reviewed results. Some degenerative changes and mild spinal stenosis in MRI cervical spine, but not enough to warrant surgery eval at this time. Patient also reluctant to pursue surgical eval.  Dx: "dizziness" = balance difficulty due to diabetic neuropathy, lumbar radiculopathy and deconditioning/obesity.   PLAN: 1. PT evaluation  Penni Bombard, MD 0/06/6577, 4:69 PM Certified in Neurology, Neurophysiology and Neuroimaging  [facility] [address] [place], [place] [address] [phone]

## 2013-12-21 NOTE — Telephone Encounter (Signed)
Patient calling again to get MRI results--very anxious to get results

## 2014-01-01 ENCOUNTER — Ambulatory Visit: Payer: BC Managed Care – PPO | Attending: Neurology | Admitting: Rehabilitative and Restorative Service Providers"

## 2014-01-01 DIAGNOSIS — R269 Unspecified abnormalities of gait and mobility: Secondary | ICD-10-CM | POA: Insufficient documentation

## 2014-01-01 DIAGNOSIS — IMO0001 Reserved for inherently not codable concepts without codable children: Secondary | ICD-10-CM | POA: Insufficient documentation

## 2014-01-01 DIAGNOSIS — R42 Dizziness and giddiness: Secondary | ICD-10-CM | POA: Insufficient documentation

## 2014-01-06 ENCOUNTER — Ambulatory Visit: Payer: BC Managed Care – PPO | Admitting: Rehabilitative and Restorative Service Providers"

## 2014-01-08 ENCOUNTER — Ambulatory Visit: Payer: BC Managed Care – PPO | Admitting: Rehabilitative and Restorative Service Providers"

## 2014-01-12 ENCOUNTER — Ambulatory Visit: Payer: BC Managed Care – PPO | Attending: Neurology | Admitting: Rehabilitative and Restorative Service Providers"

## 2014-01-12 DIAGNOSIS — R269 Unspecified abnormalities of gait and mobility: Secondary | ICD-10-CM | POA: Insufficient documentation

## 2014-01-12 DIAGNOSIS — R42 Dizziness and giddiness: Secondary | ICD-10-CM | POA: Insufficient documentation

## 2014-01-15 ENCOUNTER — Ambulatory Visit: Payer: BC Managed Care – PPO | Admitting: Rehabilitative and Restorative Service Providers"

## 2014-01-19 ENCOUNTER — Ambulatory Visit: Payer: BC Managed Care – PPO | Admitting: Physical Therapy

## 2014-01-22 ENCOUNTER — Ambulatory Visit: Payer: BC Managed Care – PPO | Admitting: Rehabilitative and Restorative Service Providers"

## 2014-01-26 ENCOUNTER — Ambulatory Visit: Payer: BC Managed Care – PPO | Admitting: Rehabilitative and Restorative Service Providers"

## 2014-01-29 ENCOUNTER — Ambulatory Visit: Payer: BC Managed Care – PPO | Admitting: Rehabilitative and Restorative Service Providers"

## 2014-02-02 ENCOUNTER — Ambulatory Visit: Payer: BC Managed Care – PPO | Admitting: Rehabilitative and Restorative Service Providers"

## 2014-02-05 ENCOUNTER — Ambulatory Visit: Payer: BC Managed Care – PPO | Admitting: Physical Therapy

## 2014-02-09 ENCOUNTER — Ambulatory Visit: Payer: BC Managed Care – PPO | Admitting: Rehabilitative and Restorative Service Providers"

## 2014-02-11 ENCOUNTER — Ambulatory Visit: Payer: BC Managed Care – PPO | Attending: Neurology | Admitting: Rehabilitative and Restorative Service Providers"

## 2014-02-11 DIAGNOSIS — IMO0001 Reserved for inherently not codable concepts without codable children: Secondary | ICD-10-CM | POA: Diagnosis present

## 2014-02-11 DIAGNOSIS — R269 Unspecified abnormalities of gait and mobility: Secondary | ICD-10-CM | POA: Insufficient documentation

## 2014-02-11 DIAGNOSIS — R42 Dizziness and giddiness: Secondary | ICD-10-CM | POA: Diagnosis not present

## 2014-02-16 ENCOUNTER — Encounter: Payer: BC Managed Care – PPO | Admitting: Rehabilitative and Restorative Service Providers"

## 2014-02-19 ENCOUNTER — Encounter: Payer: BC Managed Care – PPO | Admitting: Rehabilitative and Restorative Service Providers"

## 2014-02-19 ENCOUNTER — Ambulatory Visit: Payer: BC Managed Care – PPO | Admitting: Rehabilitative and Restorative Service Providers"

## 2014-02-19 DIAGNOSIS — R42 Dizziness and giddiness: Secondary | ICD-10-CM | POA: Diagnosis not present

## 2014-02-26 ENCOUNTER — Ambulatory Visit (INDEPENDENT_AMBULATORY_CARE_PROVIDER_SITE_OTHER): Payer: BC Managed Care – PPO | Admitting: Nurse Practitioner

## 2014-02-26 ENCOUNTER — Encounter: Payer: Self-pay | Admitting: Nurse Practitioner

## 2014-02-26 VITALS — BP 120/78 | HR 76 | Ht 62.0 in | Wt 251.0 lb

## 2014-02-26 DIAGNOSIS — IMO0002 Reserved for concepts with insufficient information to code with codable children: Secondary | ICD-10-CM

## 2014-02-26 DIAGNOSIS — M5416 Radiculopathy, lumbar region: Secondary | ICD-10-CM

## 2014-02-26 DIAGNOSIS — G609 Hereditary and idiopathic neuropathy, unspecified: Secondary | ICD-10-CM

## 2014-02-26 DIAGNOSIS — R42 Dizziness and giddiness: Secondary | ICD-10-CM

## 2014-02-26 NOTE — Progress Notes (Signed)
PATIENT: [name] [name] DOB: 1951/08/17  REASON FOR VISIT: follow up for dizziness, back pain HISTORY FROM: patient  HISTORY OF PRESENT ILLNESS: 63 year old left-handed female with hypertension, diabetes, hypercholesteremia, depression, here for evaluation of 2 problems: First dizziness for past 6 months and second low back pain radiating to right leg for past 10 years.  Regarding dizziness patient reports 6 month history of intermittent balance and wavering sensation when she first stands up. If she bends over she feels off balance and tends to fall over. She denies any vertigo, double vision, nausea, vomiting, headache. Patient had some numbness and tingling in her feet and legs. She has some neck pain.  Regarding low back pain, this is been going on for at least 10 years. She has pain in her lower back, hips, mainly radiating to the right leg. She saw [facility] several years ago and went through physical therapy which helped a little bit. She has some intermittent leg cramping, and uses Tylenol as needed for the pain. Patient took Aleve over-the-counter but this caused a rash/allergy. Patient also has significant stressors in her life, with the death of her husband in 06/17/2013 and death of her father in 18-Jul-2013.   UPDATE 02/26/14 (LL): Ms. Krenz returns for followup visit, last visit was 3 months ago.  MRI cervical spine shows some degenerative changes and mild spinal stenosis but none to warrant surgery eval at this time. TSH was normal and B12 was normal and abnormal at 318. Last Hemoglobin A1c was 7.2.  She has been attending physical therapy for gait and balance training which he states has helped.  She is also joint [facility] and is attending water exercise classes.  She is able to walk without cane now.  REVIEW OF SYSTEMS: Full 14 system review of systems performed and notable only for:  Fatigue, ear pain, ringing in ears, runny nose, trouble swallowing, eye  itching, eye redness, light sensitivity, blurred vision, cough, shortness of breath, leg swelling, flushing, diarrhea, restless leg, insomnia, daytime sleepiness, incontinence of bladder, joint pain, back pain, moles, bruise easily, dizziness, headache, weakness, depression, anxiety  ALLERGIES: Allergies  Allergen Reactions  . Nsaids   . Protonix [Pantoprazole Sodium]     Makes her feel wreidd  . Simvastatin     HOME MEDICATIONS: Outpatient Prescriptions Prior to Visit  Medication Sig Dispense Refill  . escitalopram (LEXAPRO) 10 MG tablet Take 10 mg by mouth daily.      Marland Kitchen ezetimibe (ZETIA) 10 MG tablet Take 10 mg by mouth daily.      . hydrochlorothiazide (MICROZIDE) 12.5 MG capsule Take 12.5 mg by mouth daily. 1.5 tab in am      . loratadine (CLARITIN) 10 MG tablet Take 10 mg by mouth at bedtime.      Marland Kitchen LOSARTAN POTASSIUM PO Take 1 tablet by mouth every morning.      . metFORMIN (GLUCOPHAGE) 500 MG tablet Take 2,000 mg by mouth daily with breakfast.       . rosuvastatin (CRESTOR) 5 MG tablet Take 5 mg by mouth at bedtime.      . fluticasone (FLONASE) 50 MCG/ACT nasal spray Place 1 spray into both nostrils daily.       No facility-administered medications prior to visit.     PHYSICAL EXAM  Filed Vitals:   02/26/14 1033  BP: 120/78  Pulse: 76  Height: 5\' 2"  (1.575 m)  Weight: 251 lb (113.853 kg)   Body mass index is 45.9  kg/(m^2).  Generalized: Well developed, in no acute distress  Head: normocephalic and atraumatic. Oropharynx benign  Neck: Supple, no carotid bruits  Cardiac: Regular rate rhythm, no murmur  Musculoskeletal: No deformity   Neurological examination  Mentation: Alert oriented to time, place, history taking. Follows all commands speech and language fluent Cranial nerve II-XII: Fundoscopic exam reveals sharp disc margins.Pupils were equal round reactive to light extraocular movements were full, visual field were full on confrontational test. Facial sensation  and strength were normal. hearing was intact to finger rubbing bilaterally. Uvula tongue midline. head turning and shoulder shrug and were normal and symmetric.Tongue protrusion into cheek strength was normal. Motor: The motor testing reveals 5 over 5 strength of all 4 extremities. Good symmetric motor tone is noted throughout.  Sensory: Sensory testing is intact to pinprick, soft touch, vibration sensation, and position sense on all 4 extremities. No evidence of extinction is noted.  Coordination: Cerebellar testing reveals good finger-nose-finger and heel-to-shin bilaterally.  Gait and station: Gait is normal. Tandem gait is unsteady. Romberg is negative. No drift is seen.  Reflexes: Deep tendon reflexes are symmetric and normal bilaterally.   DIAGNOSTIC DATA (LABS, IMAGING, TESTING) - I reviewed patient records, labs, notes, testing and imaging myself where available.  Lab Results  Component Value Date   HGBA1C 8.0* 12/01/2013   Lab Results  Component Value Date   [id number] 034 12/01/2013   Lab Results  Component Value Date   TSH 2.360 12/01/2013   No results found for this basename: ESRSEDRATE  10/22/13 MRI BRAIN - Mild chronic microvascular ischemic change. No acute intracranial findings.  10/22/13 MRI LUMBAR - Potentially symptomatic neural encroachment at L5-S1 on the left, but there is no dominant right-sided abnormality.  12/09/13 MR Cervical Spine -  1. At C5-6: disc bulging and uncovertebral joint hypertrophy with mild spinal stenosis and severe biforaminal foraminal stenosis  2. At C6-7: disc bulging with mild spinal stenosis and mild spinal stenosis and moderate biforaminal stenosis  3. At C4-5: disc bulging with mild spinal stenosis and mild left foraminal stenosis  4. No cord signal abnormalities.  Hgb A1c, B12, TSH  ASSESSMENT AND PLAN 63 y.o. year old female here with intermittent dizziness for past 6 months and low back pain for past 10 years.   DIZZINESS: Patient  describes primary balance difficulty. Based on exam and symptoms, could represent cervical spinal stenosis, lumbar radiculopathy, neuropathy, deconditioning/obesity.  LOW BACK PAIN: Likely related to patient's obesity, degenerative spine disease and lumbar radiculopathy. Patient not interested in additional pain medications or steroid injections.   PLAN:  Continue Physical Therapy sessions and continue the exercises after they are over. Continue with diabetes care, carb modified diet and water exercise. Recommended B12 oral supplement, level on low end of normal - 318. Follow up in our office as needed.    Philmore Pali, MSN, NP-C 02/26/2014, 10:46 AM [facility] [address] [place], [place] [address] [phone]  Note: This document was prepared with digital dictation and possible smart phrase technology. Any transcriptional errors that result from this process are unintentional.

## 2014-02-26 NOTE — Patient Instructions (Signed)
Continue Physical Therapy sessions and continue the exercises after they are over. Continue with diabetes care, carb modified diet and water exercise. Follow up in our office as needed.

## 2014-03-02 NOTE — Progress Notes (Signed)
I reviewed note and agree with plan.   Penni Bombard, MD 9/56/3875, 6:43 PM Certified in Neurology, Neurophysiology and Neuroimaging  [facility] [address], [place], [place] [address] [phone]

## 2014-03-05 ENCOUNTER — Ambulatory Visit: Payer: BC Managed Care – PPO | Admitting: Rehabilitative and Restorative Service Providers"

## 2014-03-05 DIAGNOSIS — R42 Dizziness and giddiness: Secondary | ICD-10-CM | POA: Diagnosis not present

## 2014-05-28 ENCOUNTER — Other Ambulatory Visit: Payer: Self-pay | Admitting: Family Medicine

## 2014-05-28 ENCOUNTER — Ambulatory Visit
Admission: RE | Admit: 2014-05-28 | Discharge: 2014-05-28 | Disposition: A | Discharge status: Home / Self Care | Payer: BC Managed Care – PPO | Source: Ambulatory Visit | Attending: Family Medicine | Admitting: Family Medicine

## 2014-05-28 DIAGNOSIS — R52 Pain, unspecified: Secondary | ICD-10-CM

## 2014-05-28 DIAGNOSIS — R609 Edema, unspecified: Secondary | ICD-10-CM

## 2014-07-08 ENCOUNTER — Ambulatory Visit
Admission: RE | Admit: 2014-07-08 | Discharge: 2014-07-08 | Disposition: A | Discharge status: Home / Self Care | Payer: BC Managed Care – PPO | Source: Ambulatory Visit | Attending: Family Medicine | Admitting: Family Medicine

## 2014-07-08 ENCOUNTER — Other Ambulatory Visit: Payer: Self-pay | Admitting: Family Medicine

## 2014-07-08 DIAGNOSIS — S0993XA Unspecified injury of face, initial encounter: Secondary | ICD-10-CM

## 2014-07-08 DIAGNOSIS — T1490XA Injury, unspecified, initial encounter: Secondary | ICD-10-CM

## 2014-07-22 ENCOUNTER — Other Ambulatory Visit: Payer: Self-pay

## 2014-07-22 DIAGNOSIS — Z1231 Encounter for screening mammogram for malignant neoplasm of breast: Secondary | ICD-10-CM

## 2014-08-06 ENCOUNTER — Ambulatory Visit: Payer: BC Managed Care – PPO | Attending: Family Medicine | Admitting: Rehabilitative and Restorative Service Providers"

## 2014-08-06 DIAGNOSIS — R269 Unspecified abnormalities of gait and mobility: Secondary | ICD-10-CM | POA: Diagnosis not present

## 2014-08-06 DIAGNOSIS — IMO0001 Reserved for inherently not codable concepts without codable children: Secondary | ICD-10-CM | POA: Insufficient documentation

## 2014-08-10 ENCOUNTER — Ambulatory Visit: Payer: BC Managed Care – PPO | Admitting: Physical Therapy

## 2014-08-10 DIAGNOSIS — IMO0001 Reserved for inherently not codable concepts without codable children: Secondary | ICD-10-CM | POA: Diagnosis not present

## 2014-08-12 ENCOUNTER — Ambulatory Visit: Payer: BC Managed Care – PPO | Attending: Family Medicine | Admitting: Physical Therapy

## 2014-08-12 DIAGNOSIS — R269 Unspecified abnormalities of gait and mobility: Secondary | ICD-10-CM | POA: Insufficient documentation

## 2014-08-12 DIAGNOSIS — R42 Dizziness and giddiness: Secondary | ICD-10-CM | POA: Diagnosis present

## 2014-08-12 DIAGNOSIS — G629 Polyneuropathy, unspecified: Secondary | ICD-10-CM | POA: Insufficient documentation

## 2014-08-18 ENCOUNTER — Ambulatory Visit: Payer: BC Managed Care – PPO | Admitting: Physical Therapy

## 2014-08-19 ENCOUNTER — Ambulatory Visit: Payer: BC Managed Care – PPO | Admitting: Physical Therapy

## 2014-08-19 DIAGNOSIS — G629 Polyneuropathy, unspecified: Secondary | ICD-10-CM | POA: Diagnosis not present

## 2014-08-20 ENCOUNTER — Ambulatory Visit: Payer: BC Managed Care – PPO | Admitting: Physical Therapy

## 2014-08-20 DIAGNOSIS — G629 Polyneuropathy, unspecified: Secondary | ICD-10-CM | POA: Diagnosis not present

## 2014-08-23 ENCOUNTER — Ambulatory Visit: Payer: BC Managed Care – PPO | Admitting: Physical Therapy

## 2014-08-23 DIAGNOSIS — G629 Polyneuropathy, unspecified: Secondary | ICD-10-CM | POA: Diagnosis not present

## 2014-08-24 ENCOUNTER — Ambulatory Visit: Payer: BC Managed Care – PPO | Admitting: Rehabilitative and Restorative Service Providers"

## 2014-08-24 DIAGNOSIS — G629 Polyneuropathy, unspecified: Secondary | ICD-10-CM | POA: Diagnosis not present

## 2014-08-27 ENCOUNTER — Encounter: Payer: BC Managed Care – PPO | Admitting: Physical Therapy

## 2014-08-31 ENCOUNTER — Ambulatory Visit: Payer: BC Managed Care – PPO

## 2014-09-01 ENCOUNTER — Encounter: Payer: BC Managed Care – PPO | Admitting: Physical Therapy

## 2014-09-03 ENCOUNTER — Encounter: Payer: BC Managed Care – PPO | Admitting: Rehabilitative and Restorative Service Providers"

## 2014-09-14 ENCOUNTER — Ambulatory Visit
Admission: RE | Admit: 2014-09-14 | Discharge: 2014-09-14 | Disposition: A | Discharge status: Home / Self Care | Payer: BC Managed Care – PPO | Source: Ambulatory Visit

## 2014-09-14 DIAGNOSIS — Z1231 Encounter for screening mammogram for malignant neoplasm of breast: Secondary | ICD-10-CM

## 2014-09-15 ENCOUNTER — Other Ambulatory Visit: Payer: Self-pay | Admitting: Family Medicine

## 2014-09-15 DIAGNOSIS — N950 Postmenopausal bleeding: Secondary | ICD-10-CM

## 2014-09-17 ENCOUNTER — Ambulatory Visit
Admission: RE | Admit: 2014-09-17 | Discharge: 2014-09-17 | Disposition: A | Discharge status: Home / Self Care | Payer: BC Managed Care – PPO | Source: Ambulatory Visit | Attending: Family Medicine | Admitting: Family Medicine

## 2014-09-17 DIAGNOSIS — N950 Postmenopausal bleeding: Secondary | ICD-10-CM

## 2014-10-04 ENCOUNTER — Encounter: Payer: Self-pay | Admitting: Diagnostic Neuroimaging

## 2014-10-04 ENCOUNTER — Ambulatory Visit (INDEPENDENT_AMBULATORY_CARE_PROVIDER_SITE_OTHER): Payer: BC Managed Care – PPO | Admitting: Diagnostic Neuroimaging

## 2014-10-04 VITALS — BP 129/73 | HR 96 | Temp 97.5°F | Ht 61.5 in | Wt 249.2 lb

## 2014-10-04 DIAGNOSIS — IMO0002 Reserved for concepts with insufficient information to code with codable children: Secondary | ICD-10-CM

## 2014-10-04 DIAGNOSIS — E1142 Type 2 diabetes mellitus with diabetic polyneuropathy: Secondary | ICD-10-CM

## 2014-10-04 DIAGNOSIS — M5416 Radiculopathy, lumbar region: Secondary | ICD-10-CM

## 2014-10-04 DIAGNOSIS — E1165 Type 2 diabetes mellitus with hyperglycemia: Secondary | ICD-10-CM

## 2014-10-04 NOTE — Progress Notes (Signed)
PATIENT: [name] [name] DOB: [birth year]  REASON FOR VISIT: follow up for dizziness, back pain HISTORY FROM: patient  Chief Complaint  Patient presents with  . Follow-up    dizziness    HISTORY OF PRESENT ILLNESS:  UPDATE 10/04/14 (VRP): Since last visit, has fallen down 6 times (2 at [facility], 4 at home). She has fallen tripping over locker room bench, getting on/off gym equipment, tripping at home. Still lives alone with her dog. Driving getting more difficult. Life at home more challenging.   UPDATE 02/26/14 (LL): Ms. Zucco returns for followup visit, last visit was 3 months ago.  MRI cervical spine shows some degenerative changes and mild spinal stenosis but none to warrant surgery eval at this time. TSH was normal and B12 was normal and abnormal at 318. Last Hemoglobin A1c was 7.2.  She has been attending physical therapy for gait and balance training which he states has helped.  She is also joint [facility] and is attending water exercise classes.  She is able to walk without cane now.  PRIOR HPI (11/24/13, VRP): 63 year old left-handed female with hypertension, diabetes, hypercholesteremia, depression, here for evaluation of 2 problems: First dizziness for past 6 months and second low back pain radiating to right leg for past 10 years. Regarding dizziness patient reports 6 month history of intermittent balance and wavering sensation when she first stands up. If she bends over she feels off balance and tends to fall over. She denies any vertigo, double vision, nausea, vomiting, headache. Patient had some numbness and tingling in her feet and legs. She has some neck pain. Regarding low back pain, this is been going on for at least 10 years. She has pain in her lower back, hips, mainly radiating to the right leg. She saw [facility] several years ago and went through physical therapy which helped a little bit. She has some intermittent leg cramping, and uses Tylenol as needed for the  pain. Patient took Aleve over-the-counter but this caused a rash/allergy. Patient also has significant stressors in her life, with the death of her husband in 06-20-13 and death of her father in 2013-07-21.    REVIEW OF SYSTEMS: Full 14 system review of systems performed and notable only for: dizziness gait diff back pain.    ALLERGIES: Allergies  Allergen Reactions  . Nsaids   . Simvastatin     HOME MEDICATIONS: Outpatient Prescriptions Prior to Visit  Medication Sig Dispense Refill  . DYMISTA 137-50 MCG/ACT SUSP     . escitalopram (LEXAPRO) 10 MG tablet Take 10 mg by mouth 2 (two) times daily.     Marland Kitchen LOSARTAN POTASSIUM PO Take 100 mg by mouth every morning.     . metFORMIN (GLUCOPHAGE) 500 MG tablet Take 1,000 mg by mouth daily with breakfast. 1 Tablet in the a.m; 1/2 tab @ bedtime.    Marland Kitchen ezetimibe (ZETIA) 10 MG tablet Take 10 mg by mouth daily.    . hydrochlorothiazide (MICROZIDE) 12.5 MG capsule Take 12.5 mg by mouth daily. 1.5 tab in am    . loratadine (CLARITIN) 10 MG tablet Take 10 mg by mouth at bedtime.    . montelukast (SINGULAIR) 10 MG tablet Take 10 mg by mouth at bedtime.    . rosuvastatin (CRESTOR) 5 MG tablet Take 5 mg by mouth at bedtime.     No facility-administered medications prior to visit.     PHYSICAL EXAM  Filed Vitals:   10/04/14 1421  BP: 129/73  Pulse: 96  Temp: 97.5 F (36.4 C)  TempSrc: Oral  Height: 5' 1.5" (1.562 m)  Weight: 249 lb 3.2 oz (113.036 kg)   Body mass index is 46.33 kg/(m^2).   GENERAL EXAM: Patient is in no distress; well developed, nourished and groomed; neck is supple  CARDIOVASCULAR: Regular rate and rhythm, no murmurs, no carotid bruits  NEUROLOGIC: MENTAL STATUS: awake, alert, language fluent, comprehension intact, naming intact, fund of knowledge appropriate CRANIAL NERVE: no papilledema on fundoscopic exam, pupils equal and reactive to light, visual fields full to confrontation, extraocular muscles intact, no  nystagmus, facial sensation and strength symmetric, hearing intact, palate elevates symmetrically, uvula midline, shoulder shrug symmetric, tongue midline. MOTOR: normal bulk and tone, DIFFUSE 4/5 STRENGTH IN BUE AND BLE. strength in the BUE, BLE SENSORY: normal and symmetric to light touch; DECR IN FEET COORDINATION: finger-nose-finger, fine finger movements normal REFLEXES: BUE TRACE, BLE 0 GAIT/STATION: narrow based gait; SHORT STEPS, ANTALGIC, UNSTEADY. USES CANE. DIFF STANDING.    DIAGNOSTIC DATA (LABS, IMAGING, TESTING) - I reviewed patient records, labs, notes, testing and imaging myself where available.  Lab Results  Component Value Date   HGBA1C 8.0* 12/01/2013   Lab Results  Component Value Date   [id number] 009 12/01/2013   Lab Results  Component Value Date   TSH 2.360 12/01/2013   No results found for: ESRSEDRATE    10/22/13 MRI BRAIN - Mild chronic microvascular ischemic change. No acute intracranial findings.   10/22/13 MRI LUMBAR - Potentially symptomatic neural encroachment at L5-S1 on the left, but there is no dominant right-sided abnormality.  12/09/13 MR Cervical Spine 1. At C5-6: disc bulging and uncovertebral joint hypertrophy with mild spinal stenosis and severe biforaminal foraminal stenosis  2. At C6-7: disc bulging with mild spinal stenosis and mild spinal stenosis and moderate biforaminal stenosis  3. At C4-5: disc bulging with mild spinal stenosis and mild left foraminal stenosis  4. No cord signal abnormalities.     ASSESSMENT AND PLAN  63 y.o. year old female here with intermittent dizziness for past 6 months and low back pain for past 10 years.   DIZZINESS: Patient describes primary balance difficulty, likely related to diabetic neuropathy, cervical spinal stenosis, lumbar radiculopathy and deconditioning/obesity.  LOW BACK PAIN: Likely related to patient's obesity, degenerative spine disease and lumbar radiculopathy. Patient not interested in  additional pain medications or steroid injections.   PLAN:  - I had long discussion are: home living situation; I think it has come to the point that living on her own alone will be too challenging and unsafe, due to her balance issues - use rollator walker  Penni Bombard, MD 38/18/2993, 7:16 PM Certified in Neurology, Neurophysiology and Neuroimaging  [facility] [address] [place], [place] [address] [phone]

## 2015-01-04 ENCOUNTER — Ambulatory Visit (INDEPENDENT_AMBULATORY_CARE_PROVIDER_SITE_OTHER): Payer: BLUE CROSS/BLUE SHIELD | Admitting: Diagnostic Neuroimaging

## 2015-01-04 ENCOUNTER — Encounter: Payer: Self-pay | Admitting: Diagnostic Neuroimaging

## 2015-01-04 VITALS — BP 144/88 | HR 98 | Ht 61.0 in | Wt 235.0 lb

## 2015-01-04 DIAGNOSIS — M5416 Radiculopathy, lumbar region: Secondary | ICD-10-CM | POA: Diagnosis not present

## 2015-01-04 DIAGNOSIS — E1142 Type 2 diabetes mellitus with diabetic polyneuropathy: Secondary | ICD-10-CM | POA: Diagnosis not present

## 2015-01-04 NOTE — Patient Instructions (Signed)
Caution with balance. Use walker.   Consider home aid or home physical therapy.  Caution with living alone.

## 2015-01-04 NOTE — Progress Notes (Signed)
PATIENT: [name] [name] DOB: 05-Apr-1951  REASON FOR VISIT: follow up for dizziness, back pain HISTORY FROM: patient  Chief Complaint  Patient presents with  . Follow-up    HISTORY OF PRESENT ILLNESS:  UPDATE 01/04/15 (VRP): Since last visit patient has had 4 additional falls, on November 05 2014, January 2, January 3, November 23 2014. Patient still living alone. She has had several events where she has fallen down and had to call her son to help her stand back up. Patient has looked into assisted living and retirement community's, but at this point they are unaffordable financially. Patient has a sister who lives in [place], [place], whom she may be able to move in with.   UPDATE 10/04/14 (VRP): Since last visit, has fallen down 6 times (2 at [facility], 4 at home). She has fallen tripping over locker room bench, getting on/off gym equipment, tripping at home. Still lives alone with her dog. Driving getting more difficult. Life at home more challenging.   UPDATE 02/26/14 (LL): Ms. Biscardi returns for followup visit, last visit was 3 months ago.  MRI cervical spine shows some degenerative changes and mild spinal stenosis but none to warrant surgery eval at this time. TSH was normal and B12 was normal and abnormal at 318. Last Hemoglobin A1c was 7.2.  She has been attending physical therapy for gait and balance training which he states has helped.  She is also joint [facility] and is attending water exercise classes.  She is able to walk without cane now.  PRIOR HPI (11/24/13, VRP): 64 year old left-handed female with hypertension, diabetes, hypercholesteremia, depression, here for evaluation of 2 problems: First dizziness for past 6 months and second low back pain radiating to right leg for past 10 years. Regarding dizziness patient reports 6 month history of intermittent balance and wavering sensation when she first stands up. If she bends over she feels off balance and tends to fall over. She  denies any vertigo, double vision, nausea, vomiting, headache. Patient had some numbness and tingling in her feet and legs. She has some neck pain. Regarding low back pain, this is been going on for at least 10 years. She has pain in her lower back, hips, mainly radiating to the right leg. She saw [facility] several years ago and went through physical therapy which helped a little bit. She has some intermittent leg cramping, and uses Tylenol as needed for the pain. Patient took Aleve over-the-counter but this caused a rash/allergy. Patient also has significant stressors in her life, with the death of her husband in 2013-07-18 and death of her father in 08-17-2013.    REVIEW OF SYSTEMS: Full 14 system review of systems performed and notable only for:      ALLERGIES: Allergies  Allergen Reactions  . Nsaids   . Simvastatin     HOME MEDICATIONS: Outpatient Prescriptions Prior to Visit  Medication Sig Dispense Refill  . fenofibrate 160 MG tablet Take 160 mg by mouth daily.    Marland Kitchen LOSARTAN POTASSIUM PO Take 100 mg by mouth every morning.     . metFORMIN (GLUCOPHAGE) 500 MG tablet Take 1,000 mg by mouth daily with breakfast. 1 Tablet in the a.m; 1/2 tab @ bedtime.    Marland Kitchen nystatin (MYCOSTATIN/NYSTOP) 100000 UNIT/GM POWD Apply 1 g topically 2 (two) times daily.    Vladimir Faster Glycol-Propyl Glycol 0.4-0.3 % SOLN Apply 1 drop to eye as needed.    Marland Kitchen DYMISTA 137-50 MCG/ACT SUSP     .  escitalopram (LEXAPRO) 10 MG tablet Take 10 mg by mouth 2 (two) times daily.      No facility-administered medications prior to visit.     PHYSICAL EXAM  Filed Vitals:   01/04/15 1334  BP: 144/88  Pulse: 98  Height: 5\' 1"  (1.549 m)  Weight: 235 lb (106.595 kg)   Body mass index is 44.43 kg/(m^2).   GENERAL EXAM: Patient is in no distress; well developed, nourished and groomed; neck is supple  CARDIOVASCULAR: Regular rate and rhythm, no murmurs, no carotid bruits  NEUROLOGIC: MENTAL STATUS:  awake, alert, language fluent, comprehension intact, naming intact, fund of knowledge appropriate CRANIAL NERVE: no papilledema on fundoscopic exam, pupils equal and reactive to light, visual fields full to confrontation, extraocular muscles intact, no nystagmus, facial sensation and strength symmetric, hearing intact, palate elevates symmetrically, uvula midline, shoulder shrug symmetric, tongue midline. MOTOR: normal bulk and tone, DIFFUSE 4/5 STRENGTH IN BUE AND BLE. SENSORY: normal and symmetric to light touch; DECR IN FEET COORDINATION: finger-nose-finger, fine finger movements normal REFLEXES: BUE TRACE, BLE 0 GAIT/STATION: narrow based gait; SHORT STEPS, UNSTEADY. USES CANE. DIFF STANDING UP   DIAGNOSTIC DATA (LABS, IMAGING, TESTING) - I reviewed patient records, labs, notes, testing and imaging myself where available.  Lab Results  Component Value Date   HGBA1C 8.0* 12/01/2013   Lab Results  Component Value Date   [id number] 371 12/01/2013   Lab Results  Component Value Date   TSH 2.360 12/01/2013   No results found for: ESRSEDRATE    10/22/13 MRI BRAIN - Mild chronic microvascular ischemic change. No acute intracranial findings.   10/22/13 MRI LUMBAR - Potentially symptomatic neural encroachment at L5-S1 on the left, but there is no dominant right-sided abnormality.  12/09/13 MR Cervical Spine 1. At C5-6: disc bulging and uncovertebral joint hypertrophy with mild spinal stenosis and severe biforaminal foraminal stenosis  2. At C6-7: disc bulging with mild spinal stenosis and mild spinal stenosis and moderate biforaminal stenosis  3. At C4-5: disc bulging with mild spinal stenosis and mild left foraminal stenosis  4. No cord signal abnormalities.     ASSESSMENT AND PLAN  64 y.o. year old female here with intermittent dizziness for past 6 months and low back pain for past 10 years.   DIZZINESS: Patient describes primary balance difficulty, likely related to diabetic  neuropathy, cervical spinal stenosis, lumbar radiculopathy and deconditioning/obesity.  LOW BACK PAIN: Likely related to patient's obesity, degenerative spine disease and lumbar radiculopathy. Patient not interested in additional pain medications or steroid injections.   PLAN:  - I had long discussion are: home living situation; I think it has come to the point that living on her own alone is quite challenging and unsafe, due to her balance issues and recurrent falls - use rollator walker   Return if symptoms worsen or fail to improve, for return to PCP.   Penni Bombard, MD 6/96/7893, 8:10 PM Certified in Neurology, Neurophysiology and Neuroimaging  [facility] [address], [place], [place] [address] [phone]

## 2015-05-02 ENCOUNTER — Other Ambulatory Visit: Payer: Self-pay | Admitting: Urology

## 2015-05-09 ENCOUNTER — Encounter (HOSPITAL_BASED_OUTPATIENT_CLINIC_OR_DEPARTMENT_OTHER): Payer: Self-pay | Admitting: *Deleted

## 2015-05-09 NOTE — Progress Notes (Signed)
NPO AFTER MN.  ARRIVE AT 1030.  NEEDS ISTAT AND EKG.  WILL TAKE COZAAR, CLARITIN, LEXAPRO, AND FENOFIBRATE AM DOS W/ SIPS OF WATER.

## 2015-05-12 ENCOUNTER — Encounter (HOSPITAL_BASED_OUTPATIENT_CLINIC_OR_DEPARTMENT_OTHER)
Admission: RE | Disposition: A | Discharge status: Home / Self Care | Payer: Self-pay | Source: Ambulatory Visit | Attending: Urology

## 2015-05-12 ENCOUNTER — Ambulatory Visit (HOSPITAL_BASED_OUTPATIENT_CLINIC_OR_DEPARTMENT_OTHER)
Admission: RE | Admit: 2015-05-12 | Discharge: 2015-05-12 | Disposition: A | Discharge status: Home / Self Care | Payer: BLUE CROSS/BLUE SHIELD | Source: Ambulatory Visit | Attending: Urology | Admitting: Urology

## 2015-05-12 ENCOUNTER — Encounter (HOSPITAL_BASED_OUTPATIENT_CLINIC_OR_DEPARTMENT_OTHER): Payer: Self-pay | Admitting: Anesthesiology

## 2015-05-12 ENCOUNTER — Ambulatory Visit (HOSPITAL_BASED_OUTPATIENT_CLINIC_OR_DEPARTMENT_OTHER): Payer: BLUE CROSS/BLUE SHIELD | Admitting: Anesthesiology

## 2015-05-12 DIAGNOSIS — Z87442 Personal history of urinary calculi: Secondary | ICD-10-CM

## 2015-05-12 DIAGNOSIS — G4733 Obstructive sleep apnea (adult) (pediatric): Secondary | ICD-10-CM | POA: Insufficient documentation

## 2015-05-12 DIAGNOSIS — M13811 Other specified arthritis, right shoulder: Secondary | ICD-10-CM | POA: Insufficient documentation

## 2015-05-12 DIAGNOSIS — Z9989 Dependence on other enabling machines and devices: Secondary | ICD-10-CM | POA: Diagnosis not present

## 2015-05-12 DIAGNOSIS — Z6841 Body Mass Index (BMI) 40.0 and over, adult: Secondary | ICD-10-CM | POA: Insufficient documentation

## 2015-05-12 DIAGNOSIS — N200 Calculus of kidney: Secondary | ICD-10-CM | POA: Diagnosis present

## 2015-05-12 DIAGNOSIS — Z79899 Other long term (current) drug therapy: Secondary | ICD-10-CM | POA: Diagnosis not present

## 2015-05-12 DIAGNOSIS — R9431 Abnormal electrocardiogram [ECG] [EKG]: Secondary | ICD-10-CM | POA: Diagnosis not present

## 2015-05-12 DIAGNOSIS — M13849 Other specified arthritis, unspecified hand: Secondary | ICD-10-CM | POA: Diagnosis not present

## 2015-05-12 DIAGNOSIS — M5416 Radiculopathy, lumbar region: Secondary | ICD-10-CM | POA: Insufficient documentation

## 2015-05-12 DIAGNOSIS — I10 Essential (primary) hypertension: Secondary | ICD-10-CM | POA: Insufficient documentation

## 2015-05-12 DIAGNOSIS — E1142 Type 2 diabetes mellitus with diabetic polyneuropathy: Secondary | ICD-10-CM | POA: Insufficient documentation

## 2015-05-12 DIAGNOSIS — M4802 Spinal stenosis, cervical region: Secondary | ICD-10-CM | POA: Diagnosis not present

## 2015-05-12 HISTORY — DX: Stress incontinence (female) (male): N39.3

## 2015-05-12 HISTORY — DX: Personal history of urinary calculi: Z87.442

## 2015-05-12 HISTORY — DX: Obstructive sleep apnea (adult) (pediatric): G47.33

## 2015-05-12 HISTORY — DX: Frequency of micturition: R35.0

## 2015-05-12 HISTORY — DX: Type 2 diabetes mellitus without complications: E11.9

## 2015-05-12 HISTORY — PX: HOLMIUM LASER APPLICATION: SHX5852

## 2015-05-12 HISTORY — DX: Calculus of ureter: N20.1

## 2015-05-12 HISTORY — DX: Presence of spectacles and contact lenses: Z97.3

## 2015-05-12 HISTORY — DX: Other cervical disc degeneration, unspecified cervical region: M50.30

## 2015-05-12 HISTORY — PX: CYSTOSCOPY WITH RETROGRADE PYELOGRAM, URETEROSCOPY AND STENT PLACEMENT: SHX5789

## 2015-05-12 HISTORY — DX: Type 2 diabetes mellitus with diabetic polyneuropathy: E11.42

## 2015-05-12 HISTORY — DX: Radiculopathy, lumbar region: M54.16

## 2015-05-12 HISTORY — DX: Other cervical disc displacement, unspecified cervical region: M50.20

## 2015-05-12 HISTORY — DX: Hyperlipidemia, unspecified: E78.5

## 2015-05-12 LAB — POCT I-STAT 4, (NA,K, GLUC, HGB,HCT)
GLUCOSE: 126 mg/dL — AB (ref 65–99)
HCT: 46 % (ref 36.0–46.0)
HEMOGLOBIN: 15.6 g/dL — AB (ref 12.0–15.0)
Potassium: 4 mmol/L (ref 3.5–5.1)
SODIUM: 143 mmol/L (ref 135–145)

## 2015-05-12 LAB — GLUCOSE, CAPILLARY: GLUCOSE-CAPILLARY: 135 mg/dL — AB (ref 65–99)

## 2015-05-12 SURGERY — CYSTOURETEROSCOPY, WITH RETROGRADE PYELOGRAM AND STENT INSERTION
Anesthesia: General | Site: Bladder | Laterality: Right

## 2015-05-12 MED ORDER — MIDAZOLAM HCL 2 MG/2ML IJ SOLN
INTRAMUSCULAR | Status: AC
  Filled 2015-05-12: qty 2

## 2015-05-12 MED ORDER — ACETAMINOPHEN 10 MG/ML IV SOLN
INTRAVENOUS | Status: DC | PRN
  Administered 2015-05-12: 1000 mg via INTRAVENOUS

## 2015-05-12 MED ORDER — ONDANSETRON HCL 4 MG/2ML IJ SOLN
INTRAMUSCULAR | Status: DC | PRN
  Administered 2015-05-12: 4 mg via INTRAVENOUS

## 2015-05-12 MED ORDER — OXYBUTYNIN CHLORIDE 5 MG PO TABS: 5.0000 mg | ORAL_TABLET | Freq: Three times a day (TID) | ORAL | Status: DC | PRN

## 2015-05-12 MED ORDER — FENTANYL CITRATE (PF) 100 MCG/2ML IJ SOLN
INTRAMUSCULAR | Status: AC
  Filled 2015-05-12: qty 4

## 2015-05-12 MED ORDER — DEXAMETHASONE SODIUM PHOSPHATE 4 MG/ML IJ SOLN
INTRAMUSCULAR | Status: DC | PRN
  Administered 2015-05-12: 10 mg via INTRAVENOUS

## 2015-05-12 MED ORDER — SODIUM CHLORIDE 0.9 % IR SOLN
Status: DC | PRN
  Administered 2015-05-12: 3000 mL via INTRAVESICAL

## 2015-05-12 MED ORDER — CEFAZOLIN SODIUM-DEXTROSE 2-3 GM-% IV SOLR
INTRAVENOUS | Status: AC
  Filled 2015-05-12: qty 50

## 2015-05-12 MED ORDER — EPHEDRINE SULFATE 50 MG/ML IJ SOLN
INTRAMUSCULAR | Status: DC | PRN
  Administered 2015-05-12: 10 mg via INTRAVENOUS

## 2015-05-12 MED ORDER — ONDANSETRON HCL 4 MG/2ML IJ SOLN
4.0000 mg | Freq: Once | INTRAMUSCULAR | Status: DC | PRN
  Filled 2015-05-12: qty 2

## 2015-05-12 MED ORDER — CEFAZOLIN SODIUM 1-5 GM-% IV SOLN
1.0000 g | INTRAVENOUS | Status: DC
  Filled 2015-05-12: qty 50

## 2015-05-12 MED ORDER — MIDAZOLAM HCL 5 MG/5ML IJ SOLN
INTRAMUSCULAR | Status: DC | PRN
  Administered 2015-05-12: 2 mg via INTRAVENOUS

## 2015-05-12 MED ORDER — OXYCODONE-ACETAMINOPHEN 5-325 MG PO TABS: 1.0000 | ORAL_TABLET | ORAL | Status: DC | PRN

## 2015-05-12 MED ORDER — FENTANYL CITRATE (PF) 100 MCG/2ML IJ SOLN
25.0000 ug | INTRAMUSCULAR | Status: DC | PRN
  Filled 2015-05-12: qty 1

## 2015-05-12 MED ORDER — FENTANYL CITRATE (PF) 100 MCG/2ML IJ SOLN
INTRAMUSCULAR | Status: DC | PRN
  Administered 2015-05-12 (×2): 50 ug via INTRAVENOUS

## 2015-05-12 MED ORDER — LACTATED RINGERS IV SOLN
INTRAVENOUS | Status: DC
  Administered 2015-05-12: 11:00:00 via INTRAVENOUS
  Filled 2015-05-12: qty 1000

## 2015-05-12 MED ORDER — PROPOFOL 10 MG/ML IV BOLUS
INTRAVENOUS | Status: DC | PRN
  Administered 2015-05-12: 150 mg via INTRAVENOUS

## 2015-05-12 MED ORDER — DEXAMETHASONE SODIUM PHOSPHATE 10 MG/ML IJ SOLN
INTRAMUSCULAR | Status: DC | PRN
  Administered 2015-05-12: 10 mg via INTRAVENOUS

## 2015-05-12 MED ORDER — LIDOCAINE HCL (CARDIAC) 20 MG/ML IV SOLN
INTRAVENOUS | Status: DC | PRN
  Administered 2015-05-12: 60 mg via INTRAVENOUS

## 2015-05-12 MED ORDER — IOHEXOL 350 MG/ML SOLN
INTRAVENOUS | Status: DC | PRN
  Administered 2015-05-12: 50 mL via URETHRAL

## 2015-05-12 MED ORDER — CEFAZOLIN SODIUM-DEXTROSE 2-3 GM-% IV SOLR
2.0000 g | INTRAVENOUS | Status: AC
  Administered 2015-05-12: 2 g via INTRAVENOUS
  Filled 2015-05-12: qty 50

## 2015-05-12 MED ORDER — CEPHALEXIN 500 MG PO CAPS: 500.0000 mg | ORAL_CAPSULE | Freq: Two times a day (BID) | ORAL | Status: DC

## 2015-05-12 SURGICAL SUPPLY — 25 items
ADAPTER CATH URET PLST 4-6FR (CATHETERS) IMPLANT
BAG DRAIN URO-CYSTO SKYTR STRL (DRAIN) ×3 IMPLANT
BASKET STONE 1.7 NGAGE (UROLOGICAL SUPPLIES) ×3 IMPLANT
BASKET ZERO TIP NITINOL 2.4FR (BASKET) ×3 IMPLANT
CANISTER SUCT LVC 12 LTR MEDI- (MISCELLANEOUS) IMPLANT
CATH INTERMIT  6FR 70CM (CATHETERS) ×3 IMPLANT
CLOTH BEACON ORANGE TIMEOUT ST (SAFETY) ×3 IMPLANT
EXTRACTOR STONE NITINOL NGAGE (UROLOGICAL SUPPLIES) ×3 IMPLANT
GLOVE BIO SURGEON STRL SZ8 (GLOVE) ×3 IMPLANT
GLOVE ECLIPSE 6.5 STRL STRAW (GLOVE) ×3 IMPLANT
GLOVE INDICATOR 7.0 STRL GRN (GLOVE) ×3 IMPLANT
GOWN STRL REUS W/ TWL LRG LVL3 (GOWN DISPOSABLE) ×2 IMPLANT
GOWN STRL REUS W/ TWL XL LVL3 (GOWN DISPOSABLE) ×1 IMPLANT
GOWN STRL REUS W/TWL LRG LVL3 (GOWN DISPOSABLE) ×4
GOWN STRL REUS W/TWL XL LVL3 (GOWN DISPOSABLE) ×2
GUIDEWIRE 0.038 PTFE COATED (WIRE) IMPLANT
GUIDEWIRE ANG ZIPWIRE 038X150 (WIRE) IMPLANT
GUIDEWIRE STR DUAL SENSOR (WIRE) ×3 IMPLANT
IV NS IRRIG 3000ML ARTHROMATIC (IV SOLUTION) ×3 IMPLANT
LASER FIBER DISP (UROLOGICAL SUPPLIES) ×3 IMPLANT
MANIFOLD NEPTUNE II (INSTRUMENTS) ×3 IMPLANT
NS IRRIG 500ML POUR BTL (IV SOLUTION) ×3 IMPLANT
PACK CYSTO (CUSTOM PROCEDURE TRAY) ×3 IMPLANT
SHEATH ACCESS URETERAL 38CM (SHEATH) ×6 IMPLANT
STENT URET 6FRX24 CONTOUR (STENTS) ×3 IMPLANT

## 2015-05-12 NOTE — Anesthesia Postprocedure Evaluation (Signed)
  Anesthesia Post-op Note  Patient: [name] [name]  Procedure(s) Performed: Procedure(s) (LRB): CYSTOSCOPY WITH RETROGRADE PYELOGRAM, URETEROSCOPY,STONE EXTRACTION AND STENT PLACEMENT (Right) HOLMIUM LASER APPLICATION (Right)  Patient Location: PACU  Anesthesia Type: General  Level of Consciousness: awake and alert   Airway and Oxygen Therapy: Patient Spontanous Breathing  Post-op Pain: mild  Post-op Assessment: Post-op Vital signs reviewed, Patient's Cardiovascular Status Stable, Respiratory Function Stable, Patent Airway and No signs of Nausea or vomiting  Last Vitals:  Filed Vitals:   05/12/15 1345  BP: 129/54  Pulse: 73  Temp:   Resp: 17    Post-op Vital Signs: stable   Complications: No apparent anesthesia complications

## 2015-05-12 NOTE — H&P (Signed)
H&P  Chief Complaint: Right sided kidney stone  History of Present Illness: [name] [name] is a 64 y.o. year old female who presents for ureteroscopic management of a 13 mm right renal pelvic stone that is enlarging as well as other smaller right calyceal stones.  Past Medical History  Diagnosis Date  . HTN (hypertension)   . Depression   . Hyperlipidemia   . Polyneuropathy, diabetic     WALKS W/ CANE FOR BALANCE  . Type 2 diabetes mellitus   . Right ureteral stone   . Chronic lumbar radiculopathy     L5- S1  right leg  . 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 Arthritis     fingers,right shoulder  . History of kidney stones   . Frequency of urination   . SUI (stress urinary incontinence, female)   . Wears glasses   . OSA (obstructive sleep apnea)     moderate OSA per study 11-22-2007--  refused CPAP but used oxygen for 6 months at night,  states due to wt loss stopped using oxygen    Past Surgical History  Procedure Laterality Date  . Tubal ligation  1985  . Extracorporeal shock wave lithotripsy Right 08-18-2012  . Cardiovascular stress test  09-30-2007      normal Adenosine study/  no ischemia /  normal LV function and wall motion , ef 82%  . Transthoracic echocardiogram  09-23-2007    pseudonormal LV filling pattern,  ef 65-70%/  trivial AR/  mild LAE    Home Medications:  No prescriptions prior to admission    Allergies:  Allergies  Allergen Reactions  . Aleve [Naproxen] Hives, Shortness Of Breath and Rash    Pt Avoids All Nsaids  . Zocor [Simvastatin] Other (See Comments)    "weird feeling all over body"    Family History  Problem Relation Age of Onset  . Hypertension    . Stroke    . Heart Problems Father     Social History:  reports that she has never smoked. She has never used smokeless tobacco. She reports that she drinks alcohol. She reports that she does not use illicit drugs.  ROS: A complete review of  systems was performed.  All systems are negative except for pertinent findings as noted. Pos--balance problems  Physical Exam:  Vital signs in last 24 hours:   General:  Alert and oriented, No acute distress HEENT: Normocephalic, atraumatic Neck: No JVD or lymphadenopathy Cardiovascular: Regular rate and rhythm Lungs: Clear bilaterally Abdomen: Soft, nontender, nondistended, no abdominal masses Back: No CVA tenderness Extremities: No edema Neurologic: Grossly intact  Laboratory Data:  No results found for this or any previous visit (from the past 24 hour(s)). No results found for this or any previous visit (from the past 240 hour(s)). Creatinine: No results for input(s): CREATININE in the last 168 hours.  Radiologic Imaging: No results found.  Impression/Assessment:  Enlarging right renal pelvic stone  Plan:  Right ureteroscopy with laser and extraction of right renal pelvic and calyceal stones  Jorja Loa 05/12/2015, 6:49 AM  Lillette Boxer. Tee Richeson MD

## 2015-05-12 NOTE — Discharge Instructions (Signed)
POSTOPERATIVE CARE AFTER URETEROSCOPY  Stent management  *Stents are often left in after ureteroscopy and stone treatment. If left in, they often cause urinary frequency, urgency, occasional blood in the urine, as well as flank discomfort with urination. These are all expected issues, and should resolve after the stent is removed. *Often times, a small thread is left on the end of the stent, and brought out through the urethra. If so, this is used to remove the stent, making it unnecessary to look in the bladder with a scope in the office to remove the stent. If a thread is left on, did not pull on it until instructed.  Diet  Once you have adequately recovered from anesthesia, you may gradually advance your diet, as tolerated, to your regular diet.  Activities  You may gradually increase your activities to your normal unrestricted level the day following your procedure.  Medications  You should resume all preoperative medications. If you are on aspirin-like compounds, you should not resume these until the blood clears from your urine. If given an antibiotic by the surgeon, take these until they are completed. You may also be given, if you have a stent, medications to decrease the urinary frequency and urgency.  Pain  After ureteroscopy, there may be some pain on the side of the scope. Take your pain medicine for this. Usually, this pain resolves within a day or 2.  Fever  Please report any fever over 100 to the doctor.   Post Anesthesia Home Care Instructions  Activity: Get plenty of rest for the remainder of the day. A responsible adult should stay with you for 24 hours following the procedure.  For the next 24 hours, DO NOT: -Drive a car -Paediatric nurse -Drink alcoholic beverages -Take any medication unless instructed by your physician -Make any legal decisions or sign important papers.  Meals: Start with liquid foods such as gelatin or soup. Progress to regular foods as  tolerated. Avoid greasy, spicy, heavy foods. If nausea and/or vomiting occur, drink only clear liquids until the nausea and/or vomiting subsides. Call your physician if vomiting continues.  Special Instructions/Symptoms: Your throat may feel dry or sore from the anesthesia or the breathing tube placed in your throat during surgery. If this causes discomfort, gargle with warm salt water. The discomfort should disappear within 24 hours.  If you had a scopolamine patch placed behind your ear for the management of post- operative nausea and/or vomiting:  1. The medication in the patch is effective for 72 hours, after which it should be removed.  Wrap patch in a tissue and discard in the trash. Wash hands thoroughly with soap and water. 2. You may remove the patch earlier than 72 hours if you experience unpleasant side effects which may include dry mouth, dizziness or visual disturbances. 3. Avoid touching the patch. Wash your hands with soap and water after contact with the patch.

## 2015-05-12 NOTE — Progress Notes (Signed)
Patient and son verbalized understanding of discharge instructions. All questions answered. Patient discharged home with son. C.Arron Mcnaught,RN

## 2015-05-12 NOTE — Anesthesia Procedure Notes (Signed)
Procedure Name: LMA Insertion Date/Time: 05/12/2015 11:28 AM Performed by: Wanita Chamberlain Pre-anesthesia Checklist: Patient identified, Timeout performed, Emergency Drugs available, Suction available and Patient being monitored Patient Re-evaluated:Patient Re-evaluated prior to inductionOxygen Delivery Method: Circle system utilized Preoxygenation: Pre-oxygenation with 100% oxygen Intubation Type: IV induction Ventilation: Mask ventilation without difficulty LMA: LMA inserted LMA Size: 4.0 Number of attempts: 1 Placement Confirmation: positive ETCO2 and breath sounds checked- equal and bilateral Tube secured with: Tape Dental Injury: Teeth and Oropharynx as per pre-operative assessment

## 2015-05-12 NOTE — Anesthesia Preprocedure Evaluation (Addendum)
Anesthesia Evaluation  Patient identified by MRN, date of birth, ID band Patient awake    Reviewed: Allergy & Precautions, NPO status , Patient's Chart, lab work & pertinent test results  History of Anesthesia Complications Negative for: history of anesthetic complications  Airway Mallampati: II  TM Distance: >3 FB Neck ROM: Full    Dental no notable dental hx. (+) Dental Advisory Given, Poor Dentition   Pulmonary sleep apnea and Continuous Positive Airway Pressure Ventilation ,  breath sounds clear to auscultation  Pulmonary exam normal       Cardiovascular hypertension, Pt. on medications Normal cardiovascular examRhythm:Regular Rate:Normal     Neuro/Psych PSYCHIATRIC DISORDERS Anxiety Depression negative neurological ROS     GI/Hepatic negative GI ROS, Neg liver ROS,   Endo/Other  diabetes, Type 2, Oral Hypoglycemic AgentsMorbid obesity  Renal/GU negative Renal ROS  negative genitourinary   Musculoskeletal  (+) Arthritis -, Osteoarthritis,    Abdominal   Peds negative pediatric ROS (+)  Hematology negative hematology ROS (+)   Anesthesia Other Findings   Reproductive/Obstetrics negative OB ROS                          Anesthesia Physical Anesthesia Plan  ASA: III  Anesthesia Plan: General   Post-op Pain Management:    Induction: Intravenous  Airway Management Planned: LMA  Additional Equipment:   Intra-op Plan:   Post-operative Plan: Extubation in OR  Informed Consent: I have reviewed the patients History and Physical, chart, labs and discussed the procedure including the risks, benefits and alternatives for the proposed anesthesia with the patient or authorized representative who has indicated his/her understanding and acceptance.   Dental advisory given  Plan Discussed with: CRNA and Anesthesiologist  Anesthesia Plan Comments:        Anesthesia Quick  Evaluation

## 2015-05-12 NOTE — Op Note (Signed)
Preoperative diagnosis: Right renal calculus, 13 mm  Postoperative diagnosis: Same  Procedure:  1. Cystoscopy 2. Right ureteroscopy and stone removal 3. Ureteroscopic laser lithotripsy 4. Right ureteral stent placement (24 cm x 6 French contour without tether) 5. Right retrograde pyelography with interpretation  Surgeon: Lillette Boxer. Delvon Chipps,  M.D.  Anesthesia: General  Complications: None  Intraoperative findings: Right retrograde pyelography demonstrated a filling defect within the right renal pelvis consistent with the patient's known calculus without other abnormalities noted.  EBL: Minimal  Specimens: 1. Right renal calculus fragments, to the patient's son  Disposition of specimens: [facility] for stone analysis  Indication: [name] [name]  is a 64 y.o. patient with urolithiasis. After reviewing the management options for treatment, he elected to proceed with the above surgical procedure(s). We have discussed the potential benefits and risks of the procedure, side effects of the proposed treatment, the likelihood of the patient achieving the goals of the procedure, and any potential problems that might occur during the procedure or recuperation. Informed consent has been obtained.  Description of procedure:  The patient was taken to the operating room and general anesthesia was induced.  The patient was placed in the dorsal lithotomy position, prepped and draped in the usual sterile fashion, and preoperative antibiotics were administered. A preoperative time-out was performed.   Cystourethroscopy was performed.  The patient's urethra was examined and was normal. The bladder was then circumferentially examined. There was no evidence of tumors, trabeculations or foreign bodies.  Attention then turned to the right  ureteral orifice and a ureteral catheter was used to intubate the ureteral orifice.  Omnipaque contrast was injected through the ureteral  catheter and a retrograde pyelogram was performed with findings as dictated above.  A 0.38 sensor guidewire was then advanced up the right ureter into the renal pelvis under fluoroscopic guidance.  A 12/14 Fr ureteral access sheath was then advance over the guide wire. I used this to dilate the ureter for the presumed ureteroscopy and stone extraction. The sheath was then removed. I then passed the 6 French rigid ureteroscope up the ureter. The entire ureter was normal. I was unable to cross the ureteropelvic junction is a pelvis was somewhat posterior. I then replaced the access sheath. The digital flexible ureteroscope was then advanced through the access sheath into the ureter next to the guidewire and the calculus was identified and was located in the right renal pelvis. There was a smaller stone in an interpolar calyx. This was attached to the papilla but I was able to remove it with the engage basket.   The stone was then fragmented with the 200 micron holmium laser fiber on a setting of 0.5 J and frequency of 15 Hz.  Multiple fragments were obtained. Larger ones were then removed with the engage basket. For some of the other fragments, these were hard to negotiate through the access sheath with the engage basket, and I used the 0 tip nitinol basket to assist with this.  All sizable stones were then removed with a zero tip nitinol basket. Smaller fragments were fragmented to particulate size with the laser with an energy of 0.3 and a rate of 50. Reinspection of the ureter/renal pelvis revealed no remaining visible stones or fragments of significant size.   The safety wire was then replaced and the access sheath removed.  The guidewire was backloaded through the cystoscope and a ureteral stent was advance over the wire using Seldinger technique.  The stent was positioned  appropriately under fluoroscopic and cystoscopic guidance.  The wire was then removed with an adequate stent curl noted in the renal  pelvis as well as in the bladder.  The bladder was then emptied and the procedure ended.  The patient appeared to tolerate the procedure well and without complications.  The patient was able to be awakened and transferred to the recovery unit in satisfactory condition.

## 2015-05-12 NOTE — Transfer of Care (Signed)
Immediate Anesthesia Transfer of Care Note  Patient: [name] [name]  Procedure(s) Performed: Procedure(s): CYSTOSCOPY WITH RETROGRADE PYELOGRAM, URETEROSCOPY,STONE EXTRACTION AND STENT PLACEMENT (Right) HOLMIUM LASER APPLICATION (Right)  Patient Location: PACU  Anesthesia Type:General  Level of Consciousness: awake, alert , oriented and patient cooperative  Airway & Oxygen Therapy: Patient Spontanous Breathing and Patient connected to nasal cannula oxygen  Post-op Assessment: Report given to RN and Post -op Vital signs reviewed and stable  Post vital signs: Reviewed and stable  Last Vitals:  Filed Vitals:   05/12/15 1052  BP: 153/90  Pulse: 76  Temp: 36.4 C  Resp: 18    Complications: No apparent anesthesia complications

## 2015-05-13 ENCOUNTER — Encounter (HOSPITAL_BASED_OUTPATIENT_CLINIC_OR_DEPARTMENT_OTHER): Payer: Self-pay | Admitting: Urology

## 2015-05-17 ENCOUNTER — Ambulatory Visit (INDEPENDENT_AMBULATORY_CARE_PROVIDER_SITE_OTHER): Payer: BLUE CROSS/BLUE SHIELD | Admitting: Diagnostic Neuroimaging

## 2015-05-17 ENCOUNTER — Encounter: Payer: Self-pay | Admitting: Diagnostic Neuroimaging

## 2015-05-17 VITALS — BP 138/79 | HR 100 | Ht 61.0 in | Wt 228.0 lb

## 2015-05-17 DIAGNOSIS — R208 Other disturbances of skin sensation: Secondary | ICD-10-CM

## 2015-05-17 DIAGNOSIS — E1142 Type 2 diabetes mellitus with diabetic polyneuropathy: Secondary | ICD-10-CM

## 2015-05-17 DIAGNOSIS — R200 Anesthesia of skin: Secondary | ICD-10-CM

## 2015-05-17 NOTE — Progress Notes (Signed)
PATIENT: [name] [name] DOB: Feb 13, 1951  REASON FOR VISIT: follow up for left hand numbness HISTORY FROM: patient  Chief Complaint  Patient presents with  . Numbness    rm  6, "left hand- 2 smallest fingers down to wrist"    HISTORY OF PRESENT ILLNESS:  UPDATE 05/17/15: Since last visit, now having intermittent left hand numbness (digits 4,5) for past 2 months. Lasts minutes at a time. No triggers. Had more falls (easter and last week). Now has home aid to help her.   UPDATE 01/04/15 (VRP): Since last visit patient has had 4 additional falls, on November 05 2014, January 2, January 3, November 23 2014. Patient still living alone. She has had several events where she has fallen down and had to call her son to help her stand back up. Patient has looked into assisted living and retirement community's, but at this point they are unaffordable financially. Patient has a sister who lives in [place], [place], whom she may be able to move in with.   UPDATE 10/04/14 (VRP): Since last visit, has fallen down 6 times (2 at [facility], 4 at home). She has fallen tripping over locker room bench, getting on/off gym equipment, tripping at home. Still lives alone with her dog. Driving getting more difficult. Life at home more challenging.   UPDATE 02/26/14 (LL): [name] [name] returns for followup visit, last visit was 3 months ago.  MRI cervical spine shows some degenerative changes and mild spinal stenosis but none to warrant surgery eval at this time. TSH was normal and B12 was normal and abnormal at 318. Last Hemoglobin A1c was 7.2.  She has been attending physical therapy for gait and balance training which he states has helped.  She is also joint [facility] and is attending water exercise classes.  She is able to walk without cane now.  PRIOR HPI (11/24/13, VRP): 64 year old left-handed female with hypertension, diabetes, hypercholesteremia, depression, here for evaluation of 2 problems: First dizziness for  past 6 months and second low back pain radiating to right leg for past 10 years. Regarding dizziness patient reports 6 month history of intermittent balance and wavering sensation when she first stands up. If she bends over she feels off balance and tends to fall over. She denies any vertigo, double vision, nausea, vomiting, headache. Patient had some numbness and tingling in her feet and legs. She has some neck pain. Regarding low back pain, this is been going on for at least 10 years. She has pain in her lower back, hips, mainly radiating to the right leg. She saw [facility] several years ago and went through physical therapy which helped a little bit. She has some intermittent leg cramping, and uses Tylenol as needed for the pain. Patient took Aleve over-the-counter but this caused a rash/allergy. Patient also has significant stressors in her life, with the death of her husband in 2013-08-09 and death of her father in 09/08/2013.    REVIEW OF SYSTEMS: Full 14 system review of systems performed and notable only for: fatigue hearing loss runny nose flushing eye itch light sens pain cramps depression memory loss dizziness weakness tremors cough abd pain.    ALLERGIES: Allergies  Allergen Reactions  . Aleve [Naproxen] Hives, Shortness Of Breath and Rash    Pt Avoids All Nsaids  . Zocor [Simvastatin] Other (See Comments)    "weird feeling all over body"    HOME MEDICATIONS: Outpatient Prescriptions Prior to Visit  Medication Sig Dispense  Refill  . acetaminophen (TYLENOL) 650 MG CR tablet Take 1,300 mg by mouth at bedtime and may repeat dose one time if needed.    Marland Kitchen azelastine (ASTELIN) 0.1 % nasal spray Place 1 spray into both nostrils 2 (two) times daily.   11  . cephALEXin (KEFLEX) 500 MG capsule Take 1 capsule (500 mg total) by mouth 2 (two) times daily. 10 capsule 0  . Cholecalciferol (VITAMIN D3) 50000 UNITS CAPS Take 1 capsule by mouth once a week. SATURDAY'S    .  diphenhydramine-acetaminophen (TYLENOL PM) 25-500 MG TABS Take 1 tablet by mouth at bedtime as needed.    Marland Kitchen escitalopram (LEXAPRO) 20 MG tablet Take 10 mg by mouth every morning.   0  . fenofibrate 160 MG tablet Take 160 mg by mouth every morning.     . fluticasone (FLONASE) 50 MCG/ACT nasal spray Place 2 sprays into both nostrils every morning.   11  . loratadine (CLARITIN) 10 MG tablet Take 10 mg by mouth every morning.    Marland Kitchen losartan (COZAAR) 100 MG tablet Take 50 mg by mouth every morning.    . Menthol (PERFORM PAIN RELIEVING) 3.1 % GEL Apply topically as needed (KNEE PAIN).    Marland Kitchen metFORMIN (GLUCOPHAGE) 1000 MG tablet Take 1,000 mg by mouth 2 (two) times daily with a meal. ONE TABLET IN AM /  1/2 TABLET IN PM    . montelukast (SINGULAIR) 10 MG tablet Take 10 mg by mouth at bedtime.    Marland Kitchen nystatin cream (MYCOSTATIN) Apply 1 application topically 2 (two) times daily as needed for dry skin.    Marland Kitchen oxybutynin (DITROPAN) 5 MG tablet Take 1 tablet (5 mg total) by mouth every 8 (eight) hours as needed for bladder spasms. 30 tablet 1  . oxyCODONE-acetaminophen (ROXICET) 5-325 MG per tablet Take 1-2 tablets by mouth every 4 (four) hours as needed for severe pain. 20 tablet 0  . progesterone (PROMETRIUM) 100 MG capsule Take 100 mg by mouth at bedtime.  2  . trolamine salicylate (ASPERCREME) 10 % cream Apply 1 application topically as needed for muscle pain.     No facility-administered medications prior to visit.     PHYSICAL EXAM  Filed Vitals:   05/17/15 1506  BP: 138/79  Pulse: 100  Height: 5\' 1"  (1.549 m)  Weight: 228 lb (103.42 kg)   Body mass index is 43.1 kg/(m^2).   GENERAL EXAM: Patient is in no distress; well developed, nourished and groomed; neck is supple  CARDIOVASCULAR: Regular rate and rhythm, no murmurs, no carotid bruits  NEUROLOGIC: MENTAL STATUS: awake, alert, language fluent, comprehension intact, naming intact, fund of knowledge appropriate CRANIAL NERVE: no  papilledema on fundoscopic exam, pupils equal and reactive to light, visual fields full to confrontation, extraocular muscles intact, no nystagmus, facial sensation and strength symmetric, hearing intact, palate elevates symmetrically, uvula midline, shoulder shrug symmetric, tongue midline. MOTOR: normal bulk and tone, FULL STRENGTH IN BUE AND BLE. SENSORY: normal and symmetric to light touch; DECR IN FEET COORDINATION: finger-nose-finger, fine finger movements normal REFLEXES: BUE TRACE, BLE 0 GAIT/STATION: narrow based gait; SHORT STEPS, UNSTEADY. USES CANE. DIFF STANDING UP   DIAGNOSTIC DATA (LABS, IMAGING, TESTING) - I reviewed patient records, labs, notes, testing and imaging myself where available.  Lab Results  Component Value Date   HGBA1C 8.0* 12/01/2013   Lab Results  Component Value Date   VITAMINB12 318 12/01/2013   Lab Results  Component Value Date   TSH 2.360 12/01/2013   No  results found for: ESRSEDRATE    10/22/13 MRI BRAIN - Mild chronic microvascular ischemic change. No acute intracranial findings.   10/22/13 MRI LUMBAR - Potentially symptomatic neural encroachment at L5-S1 on the left, but there is no dominant right-sided abnormality.  12/09/13 MR Cervical Spine 1. At C5-6: disc bulging and uncovertebral joint hypertrophy with mild spinal stenosis and severe biforaminal foraminal stenosis  2. At C6-7: disc bulging with mild spinal stenosis and mild spinal stenosis and moderate biforaminal stenosis  3. At C4-5: disc bulging with mild spinal stenosis and mild left foraminal stenosis  4. No cord signal abnormalities.     ASSESSMENT AND PLAN  64 y.o. year old female here with intermittent dizziness for past 6 months and low back pain for past 10 years. Now with intermittent mild left hand numbness x 2 months. Currently asymptomatic.    PLAN:  I spent 15 minutes of face to face time with patient. Greater than 50% of time was spent in counseling and  coordination of care with patient. In summary we discussed:  LEFT HAND NUMBNESS: likely related to intermittent left ulnar nerve compression at elbow vs wrist. Advised to avoid compression and monitor.   DIZZINESS: Patient describes primary balance difficulty, likely related to diabetic neuropathy, cervical spinal stenosis, lumbar radiculopathy and deconditioning/obesity.  LOW BACK PAIN: Likely related to patient's obesity, degenerative spine disease and lumbar radiculopathy. Patient not interested in additional pain medications or steroid injections.   Return if symptoms worsen or fail to improve, for return to PCP.     Penni Bombard, MD 12/16/4008, 2:72 PM Certified in Neurology, Neurophysiology and Neuroimaging  [facility] [address] [place], [place] [address] [phone]

## 2015-07-12 ENCOUNTER — Ambulatory Visit (INDEPENDENT_AMBULATORY_CARE_PROVIDER_SITE_OTHER): Payer: BLUE CROSS/BLUE SHIELD | Admitting: Diagnostic Neuroimaging

## 2015-07-12 ENCOUNTER — Encounter: Payer: Self-pay | Admitting: Diagnostic Neuroimaging

## 2015-07-12 ENCOUNTER — Telehealth: Payer: Self-pay | Admitting: Diagnostic Neuroimaging

## 2015-07-12 VITALS — BP 132/82 | HR 91 | Ht 61.0 in | Wt 219.2 lb

## 2015-07-12 DIAGNOSIS — G8190 Hemiplegia, unspecified affecting unspecified side: Secondary | ICD-10-CM | POA: Diagnosis not present

## 2015-07-12 DIAGNOSIS — G8191 Hemiplegia, unspecified affecting right dominant side: Secondary | ICD-10-CM

## 2015-07-12 NOTE — Patient Instructions (Signed)
I will check MRI brain.  

## 2015-07-12 NOTE — Telephone Encounter (Signed)
LVM for patient relaying MRI appointment.  Appointment is scheduled at [facility] 07/13/15 2:45pm check in at 1:45ppm.  [place] [phone] is [facility] number.

## 2015-07-12 NOTE — Progress Notes (Signed)
PATIENT: [name] [name] DOB: Aug 07, 1951  REASON FOR VISIT: follow up for left hand numbness HISTORY FROM: patient  Chief Complaint  Patient presents with  . Diabetic neuropathy    rm 6  . Follow-up    6 weeks    HISTORY OF PRESENT ILLNESS:  UPDATE 07/12/15: Since last visit, having more foot pain, right knee pain, left hand pain, neck and low back issues. Also with drooling from right mouth. More balance diff. More weakness on right side. Fell on 06/12/15.   UPDATE 05/17/15: Since last visit, now having intermittent left hand numbness (digits 4,5) for past 2 months. Lasts minutes at a time. No triggers. Had more falls (easter and last week). Now has home aid to help her.   UPDATE 01/04/15 (VRP): Since last visit patient has had 4 additional falls, on November 05 2014, January 2, January 3, November 23 2014. Patient still living alone. She has had several events where she has fallen down and had to call her son to help her stand back up. Patient has looked into assisted living and retirement community's, but at this point they are unaffordable financially. Patient has a sister who lives in [place], [place], whom she may be able to move in with.   UPDATE 10/04/14 (VRP): Since last visit, has fallen down 6 times (2 at [facility], 4 at home). She has fallen tripping over locker room bench, getting on/off gym equipment, tripping at home. Still lives alone with her dog. Driving getting more difficult. Life at home more challenging.   UPDATE 02/26/14 (LL): [name] [name] returns for followup visit, last visit was 3 months ago.  MRI cervical spine shows some degenerative changes and mild spinal stenosis but none to warrant surgery eval at this time. TSH was normal and B12 was normal and abnormal at 318. Last Hemoglobin A1c was 7.2.  She has been attending physical therapy for gait and balance training which he states has helped.  She is also joint [facility] and is attending water exercise classes.  She  is able to walk without cane now.  PRIOR HPI (11/24/13, VRP): 64 year old left-handed female with hypertension, diabetes, hypercholesteremia, depression, here for evaluation of 2 problems: First dizziness for past 6 months and second low back pain radiating to right leg for past 10 years. Regarding dizziness patient reports 6 month history of intermittent balance and wavering sensation when she first stands up. If she bends over she feels off balance and tends to fall over. She denies any vertigo, double vision, nausea, vomiting, headache. Patient had some numbness and tingling in her feet and legs. She has some neck pain. Regarding low back pain, this is been going on for at least 10 years. She has pain in her lower back, hips, mainly radiating to the right leg. She saw [name] several years ago and went through physical therapy which helped a little bit. She has some intermittent leg cramping, and uses Tylenol as needed for the pain. Patient took Aleve over-the-counter but this caused a rash/allergy. Patient also has significant stressors in her life, with the death of her husband in 07-11-13 and death of her father in Aug 11, 2013.    REVIEW OF SYSTEMS: Full 14 system review of systems performed and notable only for: fatigue hearing loss runny nose flushing eye itch light sens pain cramps depression memory loss dizziness weakness tremors cough abd pain.    ALLERGIES: Allergies  Allergen Reactions  . Aleve [Naproxen] Hives, Shortness Of  Breath and Rash    Pt Avoids All Nsaids  . Zocor [Simvastatin] Other (See Comments)    "weird feeling all over body"    HOME MEDICATIONS: Outpatient Prescriptions Prior to Visit  Medication Sig Dispense Refill  . acetaminophen (TYLENOL) 650 MG CR tablet Take 1,300 mg by mouth at bedtime and may repeat dose one time if needed.    Marland Kitchen azelastine (ASTELIN) 0.1 % nasal spray Place 1 spray into both nostrils 2 (two) times daily.   11  .  diphenhydramine-acetaminophen (TYLENOL PM) 25-500 MG TABS Take 1 tablet by mouth at bedtime as needed.    Marland Kitchen escitalopram (LEXAPRO) 20 MG tablet Take 10 mg by mouth every morning.   0  . fenofibrate 160 MG tablet Take 160 mg by mouth every morning.     . fluticasone (FLONASE) 50 MCG/ACT nasal spray Place 2 sprays into both nostrils every morning.   11  . loratadine (CLARITIN) 10 MG tablet Take 10 mg by mouth every morning.    Marland Kitchen losartan (COZAAR) 100 MG tablet Take 50 mg by mouth every morning.    . Menthol (PERFORM PAIN RELIEVING) 3.1 % GEL Apply topically as needed (KNEE PAIN).    Marland Kitchen metFORMIN (GLUCOPHAGE) 1000 MG tablet Take 1,000 mg by mouth 2 (two) times daily with a meal. ONE TABLET IN AM /  1/2 TABLET IN PM    . montelukast (SINGULAIR) 10 MG tablet Take 10 mg by mouth at bedtime.    Marland Kitchen nystatin cream (MYCOSTATIN) Apply 1 application topically 2 (two) times daily as needed for dry skin.    . progesterone (PROMETRIUM) 100 MG capsule Take 100 mg by mouth at bedtime.  2  . trolamine salicylate (ASPERCREME) 10 % cream Apply 1 application topically as needed for muscle pain.    . Vitamin D, Ergocalciferol, (DRISDOL) 50000 UNITS CAPS capsule TAKE 1 CAPSULE (50,000 UNITS) BY MOUTH ONCE WEEKLY  0  . cephALEXin (KEFLEX) 500 MG capsule Take 1 capsule (500 mg total) by mouth 2 (two) times daily. 10 capsule 0  . Cholecalciferol (VITAMIN D3) 50000 UNITS CAPS Take 1 capsule by mouth once a week. SATURDAY'S    . oxybutynin (DITROPAN) 5 MG tablet Take 1 tablet (5 mg total) by mouth every 8 (eight) hours as needed for bladder spasms. 30 tablet 1  . oxyCODONE-acetaminophen (ROXICET) 5-325 MG per tablet Take 1-2 tablets by mouth every 4 (four) hours as needed for severe pain. 20 tablet 0   No facility-administered medications prior to visit.     PHYSICAL EXAM  Filed Vitals:   07/12/15 1553  BP: 132/82  Pulse: 91  Height: 5\' 1"  (1.549 m)  Weight: 219 lb 3.2 oz (99.428 kg)   Body mass index is 41.44  kg/(m^2).   GENERAL EXAM: Patient is in no distress; well developed, nourished and groomed; neck is supple  CARDIOVASCULAR: Regular rate and rhythm, no murmurs, no carotid bruits  NEUROLOGIC: MENTAL STATUS: awake, alert, language fluent, comprehension intact, naming intact, fund of knowledge appropriate CRANIAL NERVE: pupils equal and reactive to light, visual fields full to confrontation, extraocular muscles intact, no nystagmus, facial sensation symmetric; DECR RIGHT NL FOLD; hearing intact, palate elevates symmetrically, uvula midline, shoulder shrug symmetric, tongue midline. MOTOR: normal bulk and tone, FULL STRENGTH IN BUE; RLE HF 3. LLE 5. SLOW TAPPING IN RUE AND RLE. SENSORY: normal and symmetric to light touch; DECR IN FEET COORDINATION: finger-nose-finger, fine finger movements normal REFLEXES: BUE TRACE, BLE 0 GAIT/STATION: narrow based gait; SHORT  STEPS, UNSTEADY. USES CANE. DIFF STANDING UP; RIGHT HEMIPARETIC GAIT   DIAGNOSTIC DATA (LABS, IMAGING, TESTING) - I reviewed patient records, labs, notes, testing and imaging myself where available.  Lab Results  Component Value Date   HGBA1C 8.0* 12/01/2013   Lab Results  Component Value Date   [id number] 381 12/01/2013   Lab Results  Component Value Date   TSH 2.360 12/01/2013   No results found for: ESRSEDRATE    10/22/13 MRI BRAIN - Mild chronic microvascular ischemic change. No acute intracranial findings.   10/22/13 MRI LUMBAR - Potentially symptomatic neural encroachment at L5-S1 on the left, but there is no dominant right-sided abnormality.  12/09/13 MR Cervical Spine 1. At C5-6: disc bulging and uncovertebral joint hypertrophy with mild spinal stenosis and severe biforaminal foraminal stenosis  2. At C6-7: disc bulging with mild spinal stenosis and mild spinal stenosis and moderate biforaminal stenosis  3. At C4-5: disc bulging with mild spinal stenosis and mild left foraminal stenosis  4. No cord signal  abnormalities.     ASSESSMENT AND PLAN  64 y.o. year old female here with intermittent dizziness for past 6 months and low back pain for past 10 years. Also with intermittent mild left hand numbness since May 2016.  Now with new onset right sided weakness, slurred speech, trouble swallowing since end of July 2016. May represent new stroke.   PLAN:   RIGHT SIDE WEAKNESS, SLURRED SPEECH, DYSPHAGIA (since late July 2016): possible new stroke (left brain, subcortical); will check MRI brain then further workup pending results. Start aspirin 81mg  daily.   LEFT HAND NUMBNESS: likely related to intermittent left ulnar neuropathy vs cervical radiculopathy. Advised to avoid compression and monitor. Patient does not want to pursue surgical mgmt or steroid/pain injections.  DIZZINESS: Patient describes primary balance difficulty, likely related to diabetic neuropathy, cervical spinal stenosis, lumbar radiculopathy and deconditioning/obesity.  LOW BACK PAIN: Likely related to patient's obesity, degenerative spine disease and lumbar radiculopathy. Patient not interested in additional pain medications or steroid injections.   Orders Placed This Encounter  Procedures  . MR Brain Wo Contrast   Return in about 1 month (around 08/12/2015).     Penni Bombard, MD 8/40/3754, 3:60 PM Certified in Neurology, Neurophysiology and Neuroimaging  [facility] [address] [place], [place] [address] [phone]

## 2015-07-13 ENCOUNTER — Ambulatory Visit (INDEPENDENT_AMBULATORY_CARE_PROVIDER_SITE_OTHER): Payer: Self-pay

## 2015-07-13 DIAGNOSIS — Z0289 Encounter for other administrative examinations: Secondary | ICD-10-CM

## 2015-07-13 DIAGNOSIS — G8190 Hemiplegia, unspecified affecting unspecified side: Secondary | ICD-10-CM | POA: Diagnosis not present

## 2015-07-13 DIAGNOSIS — G8191 Hemiplegia, unspecified affecting right dominant side: Secondary | ICD-10-CM

## 2015-07-15 ENCOUNTER — Telehealth: Payer: Self-pay | Admitting: Diagnostic Neuroimaging

## 2015-07-15 NOTE — Telephone Encounter (Signed)
I called patient. MRI shows no stroke. Recommended continued PCP follow up for stroke risk factors. -VRP

## 2015-07-28 ENCOUNTER — Ambulatory Visit
Payer: BLUE CROSS/BLUE SHIELD | Attending: Family Medicine | Admitting: Rehabilitative and Restorative Service Providers"

## 2015-07-28 DIAGNOSIS — R531 Weakness: Secondary | ICD-10-CM

## 2015-07-28 DIAGNOSIS — R269 Unspecified abnormalities of gait and mobility: Secondary | ICD-10-CM | POA: Diagnosis not present

## 2015-07-28 DIAGNOSIS — R42 Dizziness and giddiness: Secondary | ICD-10-CM | POA: Diagnosis present

## 2015-07-29 NOTE — Therapy (Signed)
[facility] [address] [place], [place], [address] Phone: [phone]   Fax:  [phone]  Physical Therapy Evaluation  Patient Details  Name: [name] [name] MRN: [id number] Date of Birth: 02/04/1951 Referring Provider:  [name], MD  Encounter Date: 07/28/2015      PT End of Session - 07/29/15 0920    Visit Number 1   Number of Visits 16   Date for PT Re-Evaluation 09/25/15   Authorization Type private insurance   PT Start Time 1315   PT Stop Time 1404   PT Time Calculation (min) 49 min   Equipment Utilized During Treatment Gait belt   Activity Tolerance Patient tolerated treatment well   Behavior During Therapy [facility] for tasks assessed/performed      Past Medical History  Diagnosis Date  . HTN (hypertension)   . Depression   . Hyperlipidemia   . Polyneuropathy, diabetic     WALKS W/ CANE FOR BALANCE  . Type 2 diabetes mellitus   . Right ureteral stone   . Chronic lumbar radiculopathy     L5- S1  right leg  . 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 Arthritis     fingers,right shoulder  . History of kidney stones 05/12/15    surgery   . Frequency of urination   . SUI (stress urinary incontinence, female)   . Wears glasses   . OSA (obstructive sleep apnea)     moderate OSA per study 11-22-2007--  refused CPAP but used oxygen for 6 months at night,  states due to wt loss stopped using oxygen    Past Surgical History  Procedure Laterality Date  . Tubal ligation  1985  . Extracorporeal shock wave lithotripsy Right 08-18-2012  . Cardiovascular stress test  09-30-2007      normal Adenosine study/  no ischemia /  normal LV function and wall motion , ef 82%  . Transthoracic echocardiogram  09-23-2007    pseudonormal LV filling pattern,  ef 65-70%/  trivial AR/  mild LAE  . Cystoscopy with retrograde pyelogram, ureteroscopy and stent placement Right 05/12/2015     Procedure: CYSTOSCOPY WITH RETROGRADE PYELOGRAM, URETEROSCOPY,STONE EXTRACTION AND STENT PLACEMENT;  Surgeon: Franchot Gallo, MD;  Location: [facility];  Service: Urology;  Laterality: Right;  . Holmium laser application Right 03/08/622    Procedure: HOLMIUM LASER APPLICATION;  Surgeon: Franchot Gallo, MD;  Location: [facility];  Service: Urology;  Laterality: Right;    There were no vitals filed for this visit.  Visit Diagnosis:  Abnormality of gait  Generalized weakness  Dizziness and giddiness      Subjective Assessment - 07/28/15 1320    Subjective The patient reports she stays dizzy "all the time".  She describes the sensation further as lightheadedness with inability to turn.  She has had mutliple falls (estimating 4+ falls) in the past 6 months.  She falls in multiple directions.   Pertinent History MRI completed 07/15/2015 with no abnormal imaging.   Patient Stated Goals Be able to walk again.   Currently in Pain? Yes   Pain Score --  discomfort described as "band around knee"   Pain Location Knee   Pain Orientation Right   Pain Descriptors / Indicators Tightness   Pain Type Chronic pain  since getting on floor to pick something up and couldn't get up-- had to get on knees  Pain Onset More than a month ago   Pain Frequency Constant   Aggravating Factors  constant band of tightness   Pain Relieving Factors unsure            [facility] PT Assessment - 07/28/15 1327    Assessment   Medical Diagnosis dizziness; abnormality of gait   Onset Date/Surgical Date 06/10/15   Prior Therapy known to our clinic- last session ended 08/2014   Precautions   Precautions Fall   Balance Screen   Has the patient fallen in the past 6 months Yes   How many times? --  4   Has the patient had a decrease in activity level because of a fear of falling?  Yes   Is the patient reluctant to leave their home because of a fear of falling?  Yes   [place] Private residence   Living Arrangements Alone   Type of [place] - single point;Grab bars - tub/shower   Additional Comments patient reports visual issues scheduled visit in 08/2015. She notes increased blurriness from dry eye and headaches in which her vision is impaired (only minimal headache with loss of visual field descriving "waviness" in front of right eye)   Prior Function   Level of Independence Independent with community mobility with device;Independent with basic ADLs;Independent with household mobility without device;Independent with homemaking with ambulation   Vocation Retired   Charity fundraiser Status Within Functional Limits for tasks assessed  per caregiver report   Behaviors --  recent onset of bowel and bladder incontinence   Observation/Other Assessments   Focus on Therapeutic Outcomes (FOTO)  40% functional status survey   Sensation   Light Touch Impaired by gross assessment  h/o neuropathy feet   Coordination   Gross Motor Movements are Fluid and Coordinated Yes  slowed pace, but equal bilaterally FTN, RAM   Posture/Postural Control   Posture/Postural Control Postural limitations   Postural Limitations Forward head;Rounded Shoulders;Decreased lumbar lordosis;Posterior pelvic tilt;Flexed trunk  right leaning   ROM / Strength   AROM / PROM / Strength AROM;Strength   AROM   AROM Assessment Site Cervical  slow movement with pain/tightness noted   Cervical - Right Side Bend 20 deg   Cervical - Left Side Bend 20 deg   Cervical - Right Rotation 65 deg   Cervical - Left Rotation 65 deg   Strength   Overall Strength Comments R shoulder flex/ab 3+/5, L 4/5, bilat elbow flex 5/5, R hip flex 3/5, L hip flexion 4/5, R knee flex/ext 4/5, L knee flex/ext 5/5, R ankle DF 3/5, L ankle DF 4/5   Palpation   Palpation comment tightness noted  in bilateral upper trap, levator, general decline in trunk rotation, tightness noted in hips per observation   Bed Mobility   Bed Mobility Supine to Sit;Sit to Supine   Supine to Sit 4: Min assist   Supine to Sit Details (indicate cue type and reason) requires increased time   Sit to Supine 6: Modified independent (Device/Increase time)   Transfers   Transfers Sit to Stand;Stand to Sit   Sit to Stand 5: Supervision   Ambulation/Gait   Ambulation/Gait Yes   Ambulation/Gait Assistance 5: Supervision   Ambulation Distance (Feet) 100 Feet   Assistive device Straight cane   Gait Pattern Decreased stride length;Shuffle;Wide base of support  Ambulation Surface Level;Indoor   Gait velocity 0.98 ft/sec   Standardized Balance Assessment   Standardized Balance Assessment Berg Balance Test   Berg Balance Test   Sit to Stand Able to stand  independently using hands   Standing Unsupported Able to stand 30 seconds unsupported   Sitting with Back Unsupported but Feet Supported on Floor or Stool Able to sit safely and securely 2 minutes   Stand to Sit Controls descent by using hands   Transfers Able to transfer safely, definite need of hands   Standing Unsupported with Eyes Closed Able to stand 10 seconds with supervision   Standing Ubsupported with Feet Together Able to place feet together independently but unable to hold for 30 seconds   From Standing, Reach Forward with Outstretched Arm Reaches forward but needs supervision   From Standing Position, Pick up Object from Floor Unable to try/needs assist to keep balance   From Standing Position, Turn to Look Behind Over each Shoulder Needs assist to keep from losing balance and falling   Turn 360 Degrees Needs assistance while turning   Standing Unsupported, Alternately Place Feet on Step/Stool Needs assistance to keep from falling or unable to try   Standing Unsupported, One Foot in [employer] balance while stepping or standing   Standing on One  Leg Unable to try or needs assist to prevent fall   Total Score 21   Berg comment: 21/56 indicating high fall risk            Vestibular Assessment - 07/28/15 1339    Vestibular Assessment   General Observation patient walks slowly into clinic with [facility] and decreased clearing of R foot   Symptom Behavior   Type of Dizziness Lightheadedness  *patient is right leaning at rest seated   Frequency of Dizziness --  daily   Duration of Dizziness --  constant   Aggravating Factors Activity in general   Relieving Factors No known relieving factors   Occulomotor Exam   Occulomotor Alignment Normal   Spontaneous Absent   Gaze-induced Absent   Smooth Pursuits Intact   Saccades Intact   Vestibulo-Occular Reflex   VOR 1 Head Only (x 1 viewing) pt c/o neck pain to both sides with rotation   Comment neck pain limits performance of gaze/VOR assessment   Positional Testing   Dix-Hallpike --  sleeps in recliner; baseline dizziness 7-8/10   Sidelying Test Sidelying Right;Sidelying Left   Horizontal Canal Testing Horizontal Canal Right;Horizontal Canal Left   Sidelying Right   Sidelying Right Duration --  worse with return to sitting   Sidelying Right Symptoms No nystagmus   Sidelying Left   Sidelying Left Duration --  worse with return to sitting   Sidelying Left Symptoms No nystagmus                         PT Short Term Goals - 07/29/15 0920    PT SHORT TERM GOAL #1   Title The patient will be indep with HEP for R LE tightness, neck A/ROM, posture, balance and general habituation.   Baseline Target date 08/26/2015   Time 4   Period Weeks   PT SHORT TERM GOAL #2   Title The patient will improve Berg score from 21/56 to > or equal to 29/56 to demo improving steady state balance.   Baseline Target date 08/26/2015   Time 4   Period Weeks   PT SHORT TERM GOAL #3   Title  The patient will improve gait speed from 0.98 ft/sec to > or equal to 1.31 ft/sec to demo  transition to "limited community ambulator" classification of gait.   Baseline Target date 08/26/2015   Time 4   Period Weeks   PT SHORT TERM GOAL #4   Title The patient will move sit<>supine independently in < or equal to 8 seconds (patient requires min A and prolonged time to complete at eval).   Baseline Target date 08/26/2015   Time 4   Period Weeks   PT SHORT TERM GOAL #5   Title The patient will ambulate with SPC modified indep x 300 ft to demo improved household/short community ambulation.   Baseline Target date 08/26/2015   Time 4   Period Weeks           PT Long Term Goals - 07/29/15 2841    PT LONG TERM GOAL #1   Title The patient will return demo progression of HEP for post d/c.   Baseline Target date 09/25/2015   Time 8   Period Weeks   PT LONG TERM GOAL #2   Title The patient will improve Berg from 21/56 to > or equal to 34/56 to demo improving steady state balance for functional mobility.   Baseline Target date 09/25/2015   Time 8   Period Weeks   PT LONG TERM GOAL #3   Title The patient will improve gait speed from 0.98 ft/sec to > or equal to 1.6 ft/sec for improved mobility.   Baseline Target date 09/25/2015   Time 8   Period Weeks   PT LONG TERM GOAL #4   Title The patient will reduce baseline dizziness from 8/10 to < or equal to 4/10 for improved tolerance to movement.   Baseline Target date 09/25/2015   Time 8   Period Weeks   PT LONG TERM GOAL #5   Title The patient will improve functional status score from 40% to > or equal to 52% to demo improved self perception of mobility.   Baseline Target date 09/25/2015   Time 8   Period Weeks               Plan - 07/29/15 3244    Clinical Impression Statement The patient is a 64 yo female known to our clinic from prior P.T.  She presents today with significant decline in mobility since being treated in 08/2014.  The patient is at a high risk for falls per gait speed, Berg and general functional  status.  She also reports visual changes- she is scheduled for eye exam in October.  She additionally reports visual changes described as "waviness" in portions of visual field with positive h/o migraines reporting that she hasn't had a bad headache in years, but still has visual changes.  In addition to functional decline, the patient has been participating in grief support group due to her husband's passing 2 years ago as well as another close family member.  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     Pt will benefit from skilled therapeutic intervention in order to improve on the following deficits Abnormal gait;Decreased balance;Decreased mobility;Decreased strength;Dizziness;Postural dysfunction;Difficulty walking;Decreased endurance;Decreased activity tolerance;Pain;Impaired flexibility   Rehab Potential Good   PT Frequency 2x / week   PT Duration 8 weeks   PT Treatment/Interventions Balance training;Neuromuscular re-education;Manual techniques;Therapeutic activities;Vestibular;Therapeutic exercise;Patient/family education;Gait training;Stair training;Functional mobility training;ADLs/Self Care Home Management   PT  Next Visit Plan HEP for mobility, flexibility, balance. Dizziness/habituation as needed.   Consulted and Agree with Plan of Care Patient         Problem List Patient Active Problem List   Diagnosis Date Noted  . Dizziness and giddiness 11/24/2013  . Diabetes type 2, uncontrolled 11/24/2013  . Severe obesity (BMI >= 40) 11/24/2013  . Lumbar radiculopathy 11/24/2013    WEAVER,CHRISTINA, PT 07/29/2015, 12:00 PM  [facility] [address] [place], [place], [address] Phone: [phone]   Fax:  [phone]

## 2015-08-04 ENCOUNTER — Encounter: Payer: BLUE CROSS/BLUE SHIELD | Admitting: Rehabilitative and Restorative Service Providers"

## 2015-08-05 ENCOUNTER — Ambulatory Visit: Payer: BLUE CROSS/BLUE SHIELD | Admitting: Rehabilitative and Restorative Service Providers"

## 2015-08-05 DIAGNOSIS — R269 Unspecified abnormalities of gait and mobility: Secondary | ICD-10-CM

## 2015-08-05 DIAGNOSIS — R531 Weakness: Secondary | ICD-10-CM

## 2015-08-05 NOTE — Patient Instructions (Signed)
Functional Quadriceps: Sit to Stand   Sit on edge of chair, feet flat on floor. Stand upright, extending knees fully and BIG POSTURE. Repeat _5___ times per set. Do __2__ sets per session. Do _1-2___ sessions per day.  http://orth.exer.us/735   Copyright  VHI. All rights reserved.  Hamstring Stretch, Seated (Strap, Two Chairs)   Sit with one leg extended onto facing chair. No strap for now! Lengthen spine. Hold for __20 seconds. Repeat __1-2__ times each leg.  Copyright  VHI. All rights reserved.  Healthy Back - Shoulder Roll   Stand straight with arms relaxed at sides. Roll shoulders backward continuously. Do __10  times.  This exercise can also be done one shoulder at a time.  Copyright  VHI. All rights reserved.

## 2015-08-05 NOTE — Therapy (Signed)
[place] [address] [place], [place], [address] Phone: [phone]   Fax:  [phone]  Physical Therapy Treatment  Patient Details  Name: NIKKIA DEVOSS MRN: [id number] Date of Birth: 05-Aug-1951 Referring Provider:  [name], MD  Encounter Date: 08/05/2015      PT End of Session - 08/05/15 0835    Visit Number 2   Number of Visits 16   Date for PT Re-Evaluation 09/25/15   Authorization Type private insurance   PT Start Time 0800   PT Stop Time 0845   PT Time Calculation (min) 45 min   Equipment Utilized During Treatment Gait belt   Activity Tolerance Patient tolerated treatment well   Behavior During Therapy [facility] for tasks assessed/performed      Past Medical History  Diagnosis Date  . HTN (hypertension)   . Depression   . Hyperlipidemia   . Polyneuropathy, diabetic     WALKS W/ CANE FOR BALANCE  . Type 2 diabetes mellitus   . Right ureteral stone   . Chronic lumbar radiculopathy     L5- S1  right leg  . 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 Arthritis     fingers,right shoulder  . History of kidney stones 05/12/15    surgery   . Frequency of urination   . SUI (stress urinary incontinence, female)   . Wears glasses   . OSA (obstructive sleep apnea)     moderate OSA per study 11-22-2007--  refused CPAP but used oxygen for 6 months at night,  states due to wt loss stopped using oxygen    Past Surgical History  Procedure Laterality Date  . Tubal ligation  1985  . Extracorporeal shock wave lithotripsy Right 08-18-2012  . Cardiovascular stress test  09-30-2007      normal Adenosine study/  no ischemia /  normal LV function and wall motion , ef 82%  . Transthoracic echocardiogram  09-23-2007    pseudonormal LV filling pattern,  ef 65-70%/  trivial AR/  mild LAE  . Cystoscopy with retrograde pyelogram, ureteroscopy and stent placement Right 05/12/2015    Procedure: CYSTOSCOPY WITH RETROGRADE PYELOGRAM, URETEROSCOPY,STONE EXTRACTION AND STENT PLACEMENT;  Surgeon: Franchot Gallo, MD;  Location: [facility];  Service: Urology;  Laterality: Right;  . Holmium laser application Right 05/13/6377    Procedure: HOLMIUM LASER APPLICATION;  Surgeon: Franchot Gallo, MD;  Location: [facility];  Service: Urology;  Laterality: Right;    There were no vitals filed for this visit.  Visit Diagnosis:  Generalized weakness  Abnormality of gait      Subjective Assessment - 08/05/15 0800    Subjective The patient had MRI on knee on Wednesday evening.   She reports 8/10 lightheadedness that is constant in nature.   Currently in Pain? Yes   Pain Score 4    Pain Location Back   Pain Orientation Lower   Pain Descriptors / Indicators Aching   Pain Type Chronic pain   Pain Onset More than a month ago   Pain Frequency Constant   Pain Relieving Factors varies            [facility] Adult PT Treatment/Exercise - 08/05/15 0807    Ambulation/Gait   Ambulation/Gait Yes   Ambulation/Gait Assistance 4: Min guard   Ambulation Distance (Feet) 100 Feet  feet x 2 repetitions, then 250 feet x 2 reps  Gait Pattern --  cues for longer stride and arm swing tactile cues   Ambulation Surface Level;Indoor   Pre-Gait Activities Discussed and demonstrated correct use of pain with practice 230 feet x 2 reps for sequencing and consistent use   Gait Comments Treadmill x 5 minutes with CGA and UE support from 0.7 mph-0.34mph   Exercises   Exercises Lumbar;Knee/Hip;Neck;Shoulder;Other Exercises   Other Exercises  sit<>stand with emphasis on posture x 5 reps   Neck Exercises: Seated   Shoulder Rolls 10 reps;Backwards   Lumbar Exercises: Supine   Bridge 5 reps  with towel roll under lumbar spine for increased stretch   Other Supine Lumbar Exercises posture exercises bringing arms 90 degree abducted for chest stretch, then rolling R/L x 3 reps  with pain on first rep   Knee/Hip Exercises: Seated   Other Seated Knee/Hip Exercises --  seated hamstring stretch bilaterally x 20 seconds    Knee/Hip Exercises: Supine   Hip Adduction Isometric Right;Left;5 sets  hip ab/adduction in hooklying   Straight Leg Raises 5 reps;Right;Left  beginning in hooklying and extending one knee, then other         Vestibular Treatment/Exercise - 08/05/15 0815    Vestibular Treatment/Exercise   Vestibular Treatment Provided Habituation;Gaze   Habituation Exercises Seated Horizontal Head Turns;Seated Vertical Head Turns   Seated Horizontal Head Turns   Number of Reps  5   Symptom Description  tightness in neck, continues with 8/10 sxs of dizziness   Seated Vertical Head Turns   Number of Reps  5   Symptom Description  c/o neck tightness, but no worsening of dizziness               PT Education - 08/05/15 0834    Education provided Yes   Education Details HEP: shoulder roll, sit<>stand, hamstring stretch   Person(s) Educated Patient   Methods Explanation;Demonstration;Handout   Comprehension Verbalized understanding;Returned demonstration          PT Short Term Goals - 07/29/15 0920    PT SHORT TERM GOAL #1   Title The patient will be indep with HEP for R LE tightness, neck A/ROM, posture, balance and general habituation.   Baseline Target date 08/26/2015   Time 4   Period Weeks   PT SHORT TERM GOAL #2   Title The patient will improve Berg score from 21/56 to > or equal to 29/56 to demo improving steady state balance.   Baseline Target date 08/26/2015   Time 4   Period Weeks   PT SHORT TERM GOAL #3   Title The patient will improve gait speed from 0.98 ft/sec to > or equal to 1.31 ft/sec to demo transition to "limited community ambulator" classification of gait.   Baseline Target date 08/26/2015   Time 4   Period Weeks   PT SHORT TERM GOAL #4   Title The patient will move sit<>supine independently in < or equal to 8 seconds  (patient requires min A and prolonged time to complete at eval).   Baseline Target date 08/26/2015   Time 4   Period Weeks   PT SHORT TERM GOAL #5   Title The patient will ambulate with SPC modified indep x 300 ft to demo improved household/short community ambulation.   Baseline Target date 08/26/2015   Time 4   Period Weeks           PT Long Term Goals - 07/29/15 0922    PT LONG TERM GOAL #1  Title The patient will return demo progression of HEP for post d/c.   Baseline Target date 09/25/2015   Time 8   Period Weeks   PT LONG TERM GOAL #2   Title The patient will improve Berg from 21/56 to > or equal to 34/56 to demo improving steady state balance for functional mobility.   Baseline Target date 09/25/2015   Time 8   Period Weeks   PT LONG TERM GOAL #3   Title The patient will improve gait speed from 0.98 ft/sec to > or equal to 1.6 ft/sec for improved mobility.   Baseline Target date 09/25/2015   Time 8   Period Weeks   PT LONG TERM GOAL #4   Title The patient will reduce baseline dizziness from 8/10 to < or equal to 4/10 for improved tolerance to movement.   Baseline Target date 09/25/2015   Time 8   Period Weeks   PT LONG TERM GOAL #5   Title The patient will improve functional status score from 40% to > or equal to 52% to demo improved self perception of mobility.   Baseline Target date 09/25/2015   Time 8   Period Weeks               Plan - 08/05/15 0848    Clinical Impression Statement The patient responds well to stretching and instruction in correct use of cane.  PT recommends using SPC left hand for greater consistency.  Continue towards STGs.   PT Next Visit Plan Check HEP, trunk rotation, flexibility, balance, gait and endurance.   Consulted and Agree with Plan of Care Patient        Problem List Patient Active Problem List   Diagnosis Date Noted  . Dizziness and giddiness 11/24/2013  . Diabetes type 2, uncontrolled 11/24/2013  . Severe  obesity (BMI >= 40) 11/24/2013  . Lumbar radiculopathy 11/24/2013    WEAVER,CHRISTINA, PT 08/05/2015, 8:50 AM  [facility] [address] [place], [place], [address] Phone: [phone]   Fax:  [phone]

## 2015-08-09 ENCOUNTER — Ambulatory Visit: Payer: BLUE CROSS/BLUE SHIELD | Admitting: Rehabilitative and Restorative Service Providers"

## 2015-08-09 DIAGNOSIS — R531 Weakness: Secondary | ICD-10-CM

## 2015-08-09 DIAGNOSIS — R269 Unspecified abnormalities of gait and mobility: Secondary | ICD-10-CM

## 2015-08-10 ENCOUNTER — Other Ambulatory Visit: Payer: Self-pay | Admitting: Family Medicine

## 2015-08-10 DIAGNOSIS — Z1231 Encounter for screening mammogram for malignant neoplasm of breast: Secondary | ICD-10-CM

## 2015-08-10 NOTE — Therapy (Signed)
[name] [address] [place], [place], [address] Phone: [phone]   Fax:  [phone]  Physical Therapy Treatment  Patient Details  Name: [name] [name] MRN: [id number] Date of Birth: 1951/08/17 Referring Provider:  [name], MD  Encounter Date: 08/09/2015      PT End of Session - 08/10/15 0800    Visit Number 3   Number of Visits 16   Date for PT Re-Evaluation 09/25/15   Authorization Type private insurance   PT Start Time 1020   PT Stop Time 1100   PT Time Calculation (min) 40 min   Equipment Utilized During Treatment Gait belt   Activity Tolerance Patient tolerated treatment well   Behavior During Therapy [facility] for tasks assessed/performed      Past Medical History  Diagnosis Date  . HTN (hypertension)   . Depression   . Hyperlipidemia   . Polyneuropathy, diabetic     WALKS W/ CANE FOR BALANCE  . Type 2 diabetes mellitus   . Right ureteral stone   . Chronic lumbar radiculopathy     L5- S1  right leg  . 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 Arthritis     fingers,right shoulder  . History of kidney stones 05/12/15    surgery   . Frequency of urination   . SUI (stress urinary incontinence, female)   . Wears glasses   . OSA (obstructive sleep apnea)     moderate OSA per study 11-22-2007--  refused CPAP but used oxygen for 6 months at night,  states due to wt loss stopped using oxygen    Past Surgical History  Procedure Laterality Date  . Tubal ligation  1985  . Extracorporeal shock wave lithotripsy Right 08-18-2012  . Cardiovascular stress test  09-30-2007      normal Adenosine study/  no ischemia /  normal LV function and wall motion , ef 82%  . Transthoracic echocardiogram  09-23-2007    pseudonormal LV filling pattern,  ef 65-70%/  trivial AR/  mild LAE  . Cystoscopy with retrograde pyelogram, ureteroscopy and stent placement Right 05/12/2015    Procedure: CYSTOSCOPY WITH RETROGRADE PYELOGRAM, URETEROSCOPY,STONE EXTRACTION AND STENT PLACEMENT;  Surgeon: Franchot Gallo, MD;  Location: [facility];  Service: Urology;  Laterality: Right;  . Holmium laser application Right 2/42/3536    Procedure: HOLMIUM LASER APPLICATION;  Surgeon: Franchot Gallo, MD;  Location: [facility];  Service: Urology;  Laterality: Right;    There were no vitals filed for this visit.  Visit Diagnosis:  Generalized weakness  Abnormality of gait      Subjective Assessment - 08/09/15 1025    Subjective The patient is working on home exercise program and walking with cane in the left hand.  She states no dizziness.  The patient did not fall to ground, but fell posteriorly onto commode this morning in an uncontrolled descent.   Patient Stated Goals Be able to walk again.   Currently in Pain? No/denies      Gait: Amb 200 feet x 2 reps CGA without cane. Verbal cues to look forward and bring shoulders back to maintain posture.   THERAPEUTIC EXERCISE: LE and trunk stretching in hook lying with towel roll under lower back hip abd/add, lumbar rocking Supine chest stretch on R and L side x 5 reps with reported pain nearing fifth rep with hip depression Neck flexion x 5  reps, upper cervical retraction x 5 reps seated Supine hip exercise with straight leg isometric hold on ball to activate gluts and core (pressing into extension)  NEUROMUSCULAR RE-EDUCATION: UE reaches while in staggered stance, opposite limb movement with hand on counter for balance        PT Short Term Goals - 07/29/15 0920    PT SHORT TERM GOAL #1   Title The patient will be indep with HEP for R LE tightness, neck A/ROM, posture, balance and general habituation.   Baseline Target date 08/26/2015   Time 4   Period Weeks   PT SHORT TERM GOAL #2   Title The patient will improve Berg score from 21/56 to > or equal to 29/56 to demo improving steady state  balance.   Baseline Target date 08/26/2015   Time 4   Period Weeks   PT SHORT TERM GOAL #3   Title The patient will improve gait speed from 0.98 ft/sec to > or equal to 1.31 ft/sec to demo transition to "limited community ambulator" classification of gait.   Baseline Target date 08/26/2015   Time 4   Period Weeks   PT SHORT TERM GOAL #4   Title The patient will move sit<>supine independently in < or equal to 8 seconds (patient requires min A and prolonged time to complete at eval).   Baseline Target date 08/26/2015   Time 4   Period Weeks   PT SHORT TERM GOAL #5   Title The patient will ambulate with SPC modified indep x 300 ft to demo improved household/short community ambulation.   Baseline Target date 08/26/2015   Time 4   Period Weeks           PT Long Term Goals - 07/29/15 5681    PT LONG TERM GOAL #1   Title The patient will return demo progression of HEP for post d/c.   Baseline Target date 09/25/2015   Time 8   Period Weeks   PT LONG TERM GOAL #2   Title The patient will improve Berg from 21/56 to > or equal to 34/56 to demo improving steady state balance for functional mobility.   Baseline Target date 09/25/2015   Time 8   Period Weeks   PT LONG TERM GOAL #3   Title The patient will improve gait speed from 0.98 ft/sec to > or equal to 1.6 ft/sec for improved mobility.   Baseline Target date 09/25/2015   Time 8   Period Weeks   PT LONG TERM GOAL #4   Title The patient will reduce baseline dizziness from 8/10 to < or equal to 4/10 for improved tolerance to movement.   Baseline Target date 09/25/2015   Time 8   Period Weeks   PT LONG TERM GOAL #5   Title The patient will improve functional status score from 40% to > or equal to 52% to demo improved self perception of mobility.   Baseline Target date 09/25/2015   Time 8   Period Weeks               Plan - 08/10/15 0803    Clinical Impression Statement The patient has improved upright posture and  responds well the stretching, exercise, and ambulation. Expressed minimal LBP after exercise and stretches, but it did not limit treatment activities. PT recommends bringing in [facility] next visit to asses stability in use.    PT Next Visit Plan Assess use of SPC compared to current tripod cane. Continue toward short  term goals. Continue to work on flexibility, posture, balance, and gait.    Consulted and Agree with Plan of Care Patient        Problem List Patient Active Problem List   Diagnosis Date Noted  . Dizziness and giddiness 11/24/2013  . Diabetes type 2, uncontrolled 11/24/2013  . Severe obesity (BMI >= 40) 11/24/2013  . Lumbar radiculopathy 11/24/2013    Kamari Bilek, PT 08/10/2015, 8:03 AM  [facility] [address] [place], [place], [address] Phone: [phone]   Fax:  [phone]

## 2015-08-11 ENCOUNTER — Ambulatory Visit: Payer: BLUE CROSS/BLUE SHIELD | Admitting: Rehabilitative and Restorative Service Providers"

## 2015-08-15 ENCOUNTER — Ambulatory Visit: Payer: BLUE CROSS/BLUE SHIELD | Admitting: Diagnostic Neuroimaging

## 2015-08-16 ENCOUNTER — Ambulatory Visit: Payer: BLUE CROSS/BLUE SHIELD | Admitting: Diagnostic Neuroimaging

## 2015-08-16 ENCOUNTER — Ambulatory Visit
Payer: BLUE CROSS/BLUE SHIELD | Attending: Family Medicine | Admitting: Rehabilitative and Restorative Service Providers"

## 2015-08-16 ENCOUNTER — Telehealth: Payer: Self-pay | Admitting: *Deleted

## 2015-08-16 DIAGNOSIS — R42 Dizziness and giddiness: Secondary | ICD-10-CM | POA: Diagnosis present

## 2015-08-16 DIAGNOSIS — R269 Unspecified abnormalities of gait and mobility: Secondary | ICD-10-CM | POA: Insufficient documentation

## 2015-08-16 DIAGNOSIS — R531 Weakness: Secondary | ICD-10-CM

## 2015-08-16 NOTE — Therapy (Signed)
[facility] [address] [place], [place], [address] Phone: [phone]   Fax:  [phone]  Physical Therapy Treatment  Patient Details  Name: [name] [name] MRN: [id number] Date of Birth: [birth year] Referring Provider:  [name], MD  Encounter Date: 08/16/2015      PT End of Session - 08/16/15 1027    Visit Number 4   Number of Visits 16   Date for PT Re-Evaluation 09/25/15   Authorization Type private insurance   PT Start Time 1026   PT Stop Time 1105   PT Time Calculation (min) 39 min   Equipment Utilized During Treatment Gait belt   Activity Tolerance Patient tolerated treatment well   Behavior During Therapy [facility] for tasks assessed/performed      Past Medical History  Diagnosis Date  . HTN (hypertension)   . Depression   . Hyperlipidemia   . Polyneuropathy, diabetic     WALKS W/ CANE FOR BALANCE  . Type 2 diabetes mellitus   . Right ureteral stone   . Chronic lumbar radiculopathy     L5- S1  right leg  . 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 Arthritis     fingers,right shoulder  . History of kidney stones 05/12/15    surgery   . Frequency of urination   . SUI (stress urinary incontinence, female)   . Wears glasses   . OSA (obstructive sleep apnea)     moderate OSA per study 11-22-2007--  refused CPAP but used oxygen for 6 months at night,  states due to wt loss stopped using oxygen    Past Surgical History  Procedure Laterality Date  . Tubal ligation  1985  . Extracorporeal shock wave lithotripsy Right 08-18-2012  . Cardiovascular stress test  09-30-2007      normal Adenosine study/  no ischemia /  normal LV function and wall motion , ef 82%  . Transthoracic echocardiogram  09-23-2007    pseudonormal LV filling pattern,  ef 65-70%/  trivial AR/  mild LAE  . Cystoscopy with retrograde pyelogram, ureteroscopy and stent placement Right 05/12/2015    Procedure: CYSTOSCOPY WITH RETROGRADE PYELOGRAM, URETEROSCOPY,STONE EXTRACTION AND STENT PLACEMENT;  Surgeon: Franchot Gallo, MD;  Location: [facility];  Service: Urology;  Laterality: Right;  . Holmium laser application Right 4/97/0263    Procedure: HOLMIUM LASER APPLICATION;  Surgeon: Franchot Gallo, MD;  Location: [facility];  Service: Urology;  Laterality: Right;    There were no vitals filed for this visit.  Visit Diagnosis:  Generalized weakness  Abnormality of gait      Subjective Assessment - 08/16/15 1028    Subjective The patient had a fall on Wednesday at her MD office and did not leave her house Thursday and Friday.  She has been feeling more unsteadiness this week since the fall.  She has also noted that her blood sugar has been dropping lower (into the 70s and 80s) and she monitoring 3x/day.   Patient Stated Goals Be able to walk again.   Currently in Pain? Yes  sore from fall last week   Pain Relieving Factors *PT will monitor response to treatment s/p fall last week.  MD has evaluated her s/p fall.            [facility] Adult PT Treatment/Exercise - 08/16/15 1040    Ambulation/Gait   Ambulation/Gait Yes   Ambulation/Gait Assistance  4: Min guard   Ambulation Distance (Feet) 300 Feet x 2 reps   Assistive device Straight cane   Ambulation Surface Level   Stairs Yes   Gait Comments Adjusted cane height for correct use.   Neuro Re-ed    Neuro Re-ed Details  physioball sitting wiht postural education for shoulder/scapular retraction, marching with CGA for safety, standing reaching activities for core engagement rolling a ball up/down wall with heel raise (reaching to top of surface), alternating LE foot taps to 12" surface decreasing UE support, moving foot into/ou tof stride position without UE support with CGA   Exercises   Other Exercises  sit<>stand without UE support working on core engagement, patient initially required CGA due to  fear of falling however progressed to supervision           PT Short Term Goals - 07/29/15 0920    PT SHORT TERM GOAL #1   Title The patient will be indep with HEP for R LE tightness, neck A/ROM, posture, balance and general habituation.   Baseline Target date 08/26/2015   Time 4   Period Weeks   PT SHORT TERM GOAL #2   Title The patient will improve Berg score from 21/56 to > or equal to 29/56 to demo improving steady state balance.   Baseline Target date 08/26/2015   Time 4   Period Weeks   PT SHORT TERM GOAL #3   Title The patient will improve gait speed from 0.98 ft/sec to > or equal to 1.31 ft/sec to demo transition to "limited community ambulator" classification of gait.   Baseline Target date 08/26/2015   Time 4   Period Weeks   PT SHORT TERM GOAL #4   Title The patient will move sit<>supine independently in < or equal to 8 seconds (patient requires min A and prolonged time to complete at eval).   Baseline Target date 08/26/2015   Time 4   Period Weeks   PT SHORT TERM GOAL #5   Title The patient will ambulate with SPC modified indep x 300 ft to demo improved household/short community ambulation.   Baseline Target date 08/26/2015   Time 4   Period Weeks           PT Long Term Goals - 07/29/15 5573    PT LONG TERM GOAL #1   Title The patient will return demo progression of HEP for post d/c.   Baseline Target date 09/25/2015   Time 8   Period Weeks   PT LONG TERM GOAL #2   Title The patient will improve Berg from 21/56 to > or equal to 34/56 to demo improving steady state balance for functional mobility.   Baseline Target date 09/25/2015   Time 8   Period Weeks   PT LONG TERM GOAL #3   Title The patient will improve gait speed from 0.98 ft/sec to > or equal to 1.6 ft/sec for improved mobility.   Baseline Target date 09/25/2015   Time 8   Period Weeks   PT LONG TERM GOAL #4   Title The patient will reduce baseline dizziness from 8/10 to < or equal to 4/10  for improved tolerance to movement.   Baseline Target date 09/25/2015   Time 8   Period Weeks   PT LONG TERM GOAL #5   Title The patient will improve functional status score from 40% to > or equal to 52% to demo improved self perception of mobility.   Baseline Target date 09/25/2015  Time 8   Period Weeks               Plan - 08/16/15 2132    Clinical Impression Statement The patient had a fall last week and is more guarded in posture and stride length (taking small, irregular steps).  Continue towards STGs.   PT Next Visit Plan flexibility, posture, balance and gait   Consulted and Agree with Plan of Care Patient        Problem List Patient Active Problem List   Diagnosis Date Noted  . Dizziness and giddiness 11/24/2013  . Diabetes type 2, uncontrolled ([place]) 11/24/2013  . Severe obesity (BMI >= 40) ([place]) 11/24/2013  . Lumbar radiculopathy 11/24/2013    Dama Hedgepeth, PT 08/16/2015, 9:36 PM  [facility] [address] [place], [place], [address] Phone: [phone]   Fax:  [phone]

## 2015-08-16 NOTE — Telephone Encounter (Signed)
Patient cannot come in today, will call back to reschedule.

## 2015-08-18 ENCOUNTER — Ambulatory Visit: Payer: BLUE CROSS/BLUE SHIELD | Admitting: Rehabilitative and Restorative Service Providers"

## 2015-08-18 DIAGNOSIS — R531 Weakness: Secondary | ICD-10-CM | POA: Diagnosis not present

## 2015-08-18 DIAGNOSIS — R269 Unspecified abnormalities of gait and mobility: Secondary | ICD-10-CM

## 2015-08-18 NOTE — Therapy (Signed)
[facility] [address] [place], [place], [address] Phone: [phone]   Fax:  [phone]  Physical Therapy Treatment  Patient Details  Name: [name] [name] MRN: [id number] Date of Birth: Jul 23, 1951 Referring Provider:  [name], MD  Encounter Date: 08/18/2015      PT End of Session - 08/18/15 1238    Visit Number 5   Number of Visits 16   Date for PT Re-Evaluation 09/25/15   Authorization Type private insurance   PT Start Time 1024   PT Stop Time 1105   PT Time Calculation (min) 41 min   Equipment Utilized During Treatment Gait belt   Activity Tolerance Patient tolerated treatment well   Behavior During Therapy [facility] for tasks assessed/performed      Past Medical History  Diagnosis Date  . HTN (hypertension)   . Depression   . Hyperlipidemia   . Polyneuropathy, diabetic     WALKS W/ CANE FOR BALANCE  . Type 2 diabetes mellitus   . Right ureteral stone   . Chronic lumbar radiculopathy     L5- S1  right leg  . 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 Arthritis     fingers,right shoulder  . History of kidney stones 05/12/15    surgery   . Frequency of urination   . SUI (stress urinary incontinence, female)   . Wears glasses   . OSA (obstructive sleep apnea)     moderate OSA per study 11-22-2007--  refused CPAP but used oxygen for 6 months at night,  states due to wt loss stopped using oxygen    Past Surgical History  Procedure Laterality Date  . Tubal ligation  1985  . Extracorporeal shock wave lithotripsy Right 08-18-2012  . Cardiovascular stress test  09-30-2007      normal Adenosine study/  no ischemia /  normal LV function and wall motion , ef 82%  . Transthoracic echocardiogram  09-23-2007    pseudonormal LV filling pattern,  ef 65-70%/  trivial AR/  mild LAE  . Cystoscopy with retrograde pyelogram, ureteroscopy and stent placement Right 05/12/2015    Procedure: CYSTOSCOPY WITH RETROGRADE PYELOGRAM, URETEROSCOPY,STONE EXTRACTION AND STENT PLACEMENT;  Surgeon: Franchot Gallo, MD;  Location: [facility];  Service: Urology;  Laterality: Right;  . Holmium laser application Right 2/69/4854    Procedure: HOLMIUM LASER APPLICATION;  Surgeon: Franchot Gallo, MD;  Location: [facility];  Service: Urology;  Laterality: Right;    There were no vitals filed for this visit.  Visit Diagnosis:  Generalized weakness  Abnormality of gait      Subjective Assessment - 08/18/15 1027    Subjective The patient has been moving better at home this week and she is doing HEP.  She reports constant 6/10 dizziness that is not spinning, but rather a sense of "lopsided" leaning to right all of the time.   Patient Stated Goals Be able to walk again.   Currently in Pain? No/denies            [facility] Adult PT Treatment/Exercise - 08/18/15 1037    Ambulation/Gait   Ambulation/Gait Yes   Ambulation/Gait Assistance 5: Supervision   Ambulation Distance (Feet) 350 Feet  x 3 reps   Assistive device None   Gait Pattern Decreased arm swing - left;Decreased arm swing - right;Decreased stride length   Ambulation Surface Level   Stairs Yes  Stairs Assistance 5: Supervision   Stair Management Technique One rail Right;Alternating pattern   Number of Stairs 16   Gait Comments patient has difficulty coordinating UEs/LEs movements with gait activities   Neuro Re-ed    Neuro Re-ed Details  Standing sidestepping with postural cues (arms abducted/scap retraction) x 10 feet x 6 reps to each side, lateral lunges with return to midline for power during push-off, rocker board with postural exercises of reaching and shoulder rolls, then rocker board with head turns and then eyes closed, compliant foam standing with eyes open/closed and head turns.  Requires min A for high level balance tasks.   Exercises   Exercises Other Exercises   Other  Exercises  standing mini squats with lateral weight shift, wide leg marching with CGA for safety           PT Short Term Goals - 07/29/15 0920    PT SHORT TERM GOAL #1   Title The patient will be indep with HEP for R LE tightness, neck A/ROM, posture, balance and general habituation.   Baseline Target date 08/26/2015   Time 4   Period Weeks   PT SHORT TERM GOAL #2   Title The patient will improve Berg score from 21/56 to > or equal to 29/56 to demo improving steady state balance.   Baseline Target date 08/26/2015   Time 4   Period Weeks   PT SHORT TERM GOAL #3   Title The patient will improve gait speed from 0.98 ft/sec to > or equal to 1.31 ft/sec to demo transition to "limited community ambulator" classification of gait.   Baseline Target date 08/26/2015   Time 4   Period Weeks   PT SHORT TERM GOAL #4   Title The patient will move sit<>supine independently in < or equal to 8 seconds (patient requires min A and prolonged time to complete at eval).   Baseline Target date 08/26/2015   Time 4   Period Weeks   PT SHORT TERM GOAL #5   Title The patient will ambulate with SPC modified indep x 300 ft to demo improved household/short community ambulation.   Baseline Target date 08/26/2015   Time 4   Period Weeks           PT Long Term Goals - 07/29/15 4098    PT LONG TERM GOAL #1   Title The patient will return demo progression of HEP for post d/c.   Baseline Target date 09/25/2015   Time 8   Period Weeks   PT LONG TERM GOAL #2   Title The patient will improve Berg from 21/56 to > or equal to 34/56 to demo improving steady state balance for functional mobility.   Baseline Target date 09/25/2015   Time 8   Period Weeks   PT LONG TERM GOAL #3   Title The patient will improve gait speed from 0.98 ft/sec to > or equal to 1.6 ft/sec for improved mobility.   Baseline Target date 09/25/2015   Time 8   Period Weeks   PT LONG TERM GOAL #4   Title The patient will reduce  baseline dizziness from 8/10 to < or equal to 4/10 for improved tolerance to movement.   Baseline Target date 09/25/2015   Time 8   Period Weeks   PT LONG TERM GOAL #5   Title The patient will improve functional status score from 40% to > or equal to 52% to demo improved self perception of mobility.   Baseline  Target date 09/25/2015   Time 8   Period Weeks               Plan - 08/18/15 1239    Clinical Impression Statement The patient is tolerating movement better today.  PT to progress towards STG and LTGs with emphasis next week of further HEP development.   PT Next Visit Plan *DEVELOP HEP, flexibility, posture, balance and gait   Consulted and Agree with Plan of Care Patient        Problem List Patient Active Problem List   Diagnosis Date Noted  . Dizziness and giddiness 11/24/2013  . Diabetes type 2, uncontrolled ([place]) 11/24/2013  . Severe obesity (BMI >= 40) ([place]) 11/24/2013  . Lumbar radiculopathy 11/24/2013    Richard Holz, PT 08/18/2015, 12:40 PM  [facility] [address] [place], [place], [address] Phone: [phone]   Fax:  [phone]

## 2015-08-23 ENCOUNTER — Ambulatory Visit: Payer: BLUE CROSS/BLUE SHIELD | Admitting: Rehabilitative and Restorative Service Providers"

## 2015-08-23 DIAGNOSIS — R531 Weakness: Secondary | ICD-10-CM | POA: Diagnosis not present

## 2015-08-23 DIAGNOSIS — R269 Unspecified abnormalities of gait and mobility: Secondary | ICD-10-CM

## 2015-08-23 NOTE — Therapy (Signed)
[facility] [address] [place], [place], [address] Phone: [phone]   Fax:  [phone]  Physical Therapy Treatment  Patient Details  Name: [name] [name] MRN: [id number] Date of Birth: 06-21-1951 Referring Provider:  [name], MD  Encounter Date: 08/23/2015      PT End of Session - 08/23/15 1431    Visit Number 6   Number of Visits 16   Date for PT Re-Evaluation 09/25/15   Authorization Type private insurance   PT Start Time 1022   PT Stop Time 1102   PT Time Calculation (min) 40 min   Equipment Utilized During Treatment Gait belt   Activity Tolerance Patient tolerated treatment well   Behavior During Therapy [facility] for tasks assessed/performed      Past Medical History  Diagnosis Date  . HTN (hypertension)   . Depression   . Hyperlipidemia   . Polyneuropathy, diabetic     WALKS W/ CANE FOR BALANCE  . Type 2 diabetes mellitus   . Right ureteral stone   . Chronic lumbar radiculopathy     L5- S1  right leg  . 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 Arthritis     fingers,right shoulder  . History of kidney stones 05/12/15    surgery   . Frequency of urination   . SUI (stress urinary incontinence, female)   . Wears glasses   . OSA (obstructive sleep apnea)     moderate OSA per study 11-22-2007--  refused CPAP but used oxygen for 6 months at night,  states due to wt loss stopped using oxygen    Past Surgical History  Procedure Laterality Date  . Tubal ligation  1985  . Extracorporeal shock wave lithotripsy Right 08-18-2012  . Cardiovascular stress test  09-30-2007      normal Adenosine study/  no ischemia /  normal LV function and wall motion , ef 82%  . Transthoracic echocardiogram  09-23-2007    pseudonormal LV filling pattern,  ef 65-70%/  trivial AR/  mild LAE  . Cystoscopy with retrograde pyelogram, ureteroscopy and stent placement Right 05/12/2015     Procedure: CYSTOSCOPY WITH RETROGRADE PYELOGRAM, URETEROSCOPY,STONE EXTRACTION AND STENT PLACEMENT;  Surgeon: Franchot Gallo, MD;  Location: [facility];  Service: Urology;  Laterality: Right;  . Holmium laser application Right 3/54/6568    Procedure: HOLMIUM LASER APPLICATION;  Surgeon: Franchot Gallo, MD;  Location: [facility];  Service: Urology;  Laterality: Right;    There were no vitals filed for this visit.  Visit Diagnosis:  Abnormality of gait  Generalized weakness      Subjective Assessment - 08/23/15 1025    Subjective The patient's blood sugar is doing good today 100-102 this morning.  Patient feels fatigued.  She attended pool class yesterday.    Patient Stated Goals Be able to walk again.   Currently in Pain? No/denies         [facility] Adult PT Treatment/Exercise - 08/23/15 1438    Ambulation/Gait   Ambulation/Gait Yes   Ambulation/Gait Assistance 5: Supervision   Ambulation Distance (Feet) 350 Feet  x 3 reps with cues on longer stride length   Assistive device Straight cane   Gait Pattern Decreased arm swing - left;Decreased arm swing - right;Decreased stride length   Ambulation Surface Level   Neuro Re-ed    Neuro Re-ed Details  seated physioball core stabilization activities working  on "w" upper back exercise x 10 reps, marching with holds for core, LE extension, UE /scap retraction activities and general postural cues for abdominal tightening.  Standing alternating foot taps to cones on compliant surfaces, single limb stance activities, wall bumps working on postural control and backwards walking with min A x 10 feet x 3 times.   Exercises   Other Exercises  standing heel raises with cues on foot position, standing hip abduction x 10 reps            PT Education - 08/23/15 1430    Education provided Yes   Education Details HEP: heel raises, hip abduction   Person(s) Educated Patient   Methods  Explanation;Demonstration;Handout   Comprehension Verbalized understanding;Returned demonstration          PT Short Term Goals - 07/29/15 0920    PT SHORT TERM GOAL #1   Title The patient will be indep with HEP for R LE tightness, neck A/ROM, posture, balance and general habituation.   Baseline Target date 08/26/2015   Time 4   Period Weeks   PT SHORT TERM GOAL #2   Title The patient will improve Berg score from 21/56 to > or equal to 29/56 to demo improving steady state balance.   Baseline Target date 08/26/2015   Time 4   Period Weeks   PT SHORT TERM GOAL #3   Title The patient will improve gait speed from 0.98 ft/sec to > or equal to 1.31 ft/sec to demo transition to "limited community ambulator" classification of gait.   Baseline Target date 08/26/2015   Time 4   Period Weeks   PT SHORT TERM GOAL #4   Title The patient will move sit<>supine independently in < or equal to 8 seconds (patient requires min A and prolonged time to complete at eval).   Baseline Target date 08/26/2015   Time 4   Period Weeks   PT SHORT TERM GOAL #5   Title The patient will ambulate with SPC modified indep x 300 ft to demo improved household/short community ambulation.   Baseline Target date 08/26/2015   Time 4   Period Weeks           PT Long Term Goals - 07/29/15 4315    PT LONG TERM GOAL #1   Title The patient will return demo progression of HEP for post d/c.   Baseline Target date 09/25/2015   Time 8   Period Weeks   PT LONG TERM GOAL #2   Title The patient will improve Berg from 21/56 to > or equal to 34/56 to demo improving steady state balance for functional mobility.   Baseline Target date 09/25/2015   Time 8   Period Weeks   PT LONG TERM GOAL #3   Title The patient will improve gait speed from 0.98 ft/sec to > or equal to 1.6 ft/sec for improved mobility.   Baseline Target date 09/25/2015   Time 8   Period Weeks   PT LONG TERM GOAL #4   Title The patient will reduce  baseline dizziness from 8/10 to < or equal to 4/10 for improved tolerance to movement.   Baseline Target date 09/25/2015   Time 8   Period Weeks   PT LONG TERM GOAL #5   Title The patient will improve functional status score from 40% to > or equal to 52% to demo improved self perception of mobility.   Baseline Target date 09/25/2015   Time 8   Period  Weeks               Plan - 08/23/15 1431    Clinical Impression Statement PT is continuing to work towards STGs-check goals next visit and determine if modifications needed to LTGs.  PT added 2 more HEP to address LE strengthening.  The patient reports fatigue in mornings and is noted ot have sleep apnea on PMH.  She reports she does not use CPAP--recommend patient discuss this condition further with MD if fatigue prevelant complaint.   PT Next Visit Plan check STGs, flexibility, posture, balance, gait   Consulted and Agree with Plan of Care Patient        Problem List Patient Active Problem List   Diagnosis Date Noted  . Dizziness and giddiness 11/24/2013  . Diabetes type 2, uncontrolled ([place]) 11/24/2013  . Severe obesity (BMI >= 40) ([place]) 11/24/2013  . Lumbar radiculopathy 11/24/2013    Delanie Tirrell, PT 08/23/2015, 2:41 PM  [facility] [address] [place], [place], [address] Phone: [phone]   Fax:  [phone]

## 2015-08-23 NOTE — Patient Instructions (Addendum)
Heel Raise: Bilateral (Standing)    STAND NEAR A COUNTERTOP FOR SAFETY AND USE ONE HAND FOR SUPPORT.  *Keep toes pointing forward.  Rise on balls of feet. Repeat __20__ times per set. Do _1___ sets per session. Do _1-2___ sessions per day.  http://orth.exer.us/39   Copyright  VHI. All rights reserved.  ABDUCTION: Standing (Active)    Stand, feet flat. Lift right leg out to side. No weights*  TOES FACE FORWARD. Complete _1__ sets of _10__ repetitions. Perform _1-2__ sessions per day.  http://gtsc.exer.us/111   Copyright  VHI. All rights reserved.

## 2015-08-25 ENCOUNTER — Ambulatory Visit: Payer: BLUE CROSS/BLUE SHIELD | Admitting: Rehabilitative and Restorative Service Providers"

## 2015-08-25 DIAGNOSIS — R269 Unspecified abnormalities of gait and mobility: Secondary | ICD-10-CM

## 2015-08-25 DIAGNOSIS — R531 Weakness: Secondary | ICD-10-CM

## 2015-08-25 NOTE — Therapy (Signed)
[facility] [address] [place], [place], [address] Phone: [phone]   Fax:  [phone]  Physical Therapy Treatment  Patient Details  Name: [name] [name] MRN: [id number] Date of Birth: [birth year] Referring Provider:  [name], MD  Encounter Date: 08/25/2015      PT End of Session - 08/25/15 1415    Visit Number 7   Number of Visits 16   Date for PT Re-Evaluation 09/25/15   Authorization Type private insurance   PT Start Time 1320   PT Stop Time 1400   PT Time Calculation (min) 40 min   Equipment Utilized During Treatment Gait belt   Activity Tolerance Patient tolerated treatment well   Behavior During Therapy [facility] for tasks assessed/performed      Past Medical History  Diagnosis Date  . HTN (hypertension)   . Depression   . Hyperlipidemia   . Polyneuropathy, diabetic     WALKS W/ CANE FOR BALANCE  . Type 2 diabetes mellitus   . Right ureteral stone   . Chronic lumbar radiculopathy     L5- S1  right leg  . 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 Arthritis     fingers,right shoulder  . History of kidney stones 05/12/15    surgery   . Frequency of urination   . SUI (stress urinary incontinence, female)   . Wears glasses   . OSA (obstructive sleep apnea)     moderate OSA per study 11-22-2007--  refused CPAP but used oxygen for 6 months at night,  states due to wt loss stopped using oxygen    Past Surgical History  Procedure Laterality Date  . Tubal ligation  1985  . Extracorporeal shock wave lithotripsy Right 08-18-2012  . Cardiovascular stress test  09-30-2007      normal Adenosine study/  no ischemia /  normal LV function and wall motion , ef 82%  . Transthoracic echocardiogram  09-23-2007    pseudonormal LV filling pattern,  ef 65-70%/  trivial AR/  mild LAE  . Cystoscopy with retrograde pyelogram, ureteroscopy and stent placement Right 05/12/2015     Procedure: CYSTOSCOPY WITH RETROGRADE PYELOGRAM, URETEROSCOPY,STONE EXTRACTION AND STENT PLACEMENT;  Surgeon: Franchot Gallo, MD;  Location: [facility];  Service: Urology;  Laterality: Right;  . Holmium laser application Right 0/34/7425    Procedure: HOLMIUM LASER APPLICATION;  Surgeon: Franchot Gallo, MD;  Location: [facility];  Service: Urology;  Laterality: Right;    There were no vitals filed for this visit.  Visit Diagnosis:  Abnormality of gait  Generalized weakness      Subjective Assessment - 08/25/15 1323    Subjective The patient reports that she saw Dr. Ernie Hew yesterday and she plans to set up a sleep study.  She cut morning metformin to 1/2 tablet.   Patient Stated Goals Be able to walk again.   Currently in Pain? Yes   Pain Score 0-No pain  neck stiff            [facility] PT Assessment - 08/25/15 0001    Standardized Balance Assessment   Standardized Balance Assessment Berg Balance Test   Berg Balance Test   Sit to Stand Able to stand without using hands and stabilize independently   Standing Unsupported Able to stand safely 2 minutes   Sitting with Back Unsupported but Feet Supported on Floor or Stool Able to sit  safely and securely 2 minutes   Stand to Sit Sits safely with minimal use of hands   Transfers Able to transfer safely, minor use of hands   Standing Unsupported with Eyes Closed Able to stand 10 seconds with supervision   Standing Ubsupported with Feet Together Able to place feet together independently and stand 1 minute safely   From Standing, Reach Forward with Outstretched Arm Reaches forward but needs supervision   From Standing Position, Pick up Object from Floor Able to pick up shoe, needs supervision   From Standing Position, Turn to Look Behind Over each Shoulder Turn sideways only but maintains balance   Turn 360 Degrees Needs close supervision or verbal cueing   Standing Unsupported, Alternately Place Feet  on Step/Stool Able to stand independently and safely and complete 8 steps in 20 seconds   Standing Unsupported, One Foot in Front Able to plae foot ahead of the other independently and hold 30 seconds   Standing on One Leg Tries to lift leg/unable to hold 3 seconds but remains standing independently   Total Score 42   Berg comment: 42/56, improved from 21/56 at initial evaluation     sit<>supine requiring extended time to perform.       [facility] Adult PT Treatment/Exercise - 08/25/15 1343    Ambulation/Gait   Ambulation/Gait Yes   Ambulation/Gait Assistance 5: Supervision;6: Modified independent (Device/Increase time)   Ambulation/Gait Assistance Details modified indep with SPC and supervision without device   Ambulation Distance (Feet) 500 Feet  with SPC followed by 250 feet x 3 reps without device   Assistive device Straight cane  and without device   Gait Pattern Decreased arm swing - left;Decreased arm swing - right;Decreased stride length   Ambulation Surface Level   Gait velocity 1.63   Neuro Re-ed    Neuro Re-ed Details  walking on heels, walking on toes with min A , marching with min A   Exercises   Exercises Other Exercises   Other Exercises  standing "W" exercises x 10 reps in door frame for posture, chin tucks x 5 reps, sit<>stand x 10 reps without UE support                  PT Short Term Goals - 08/25/15 1330    PT SHORT TERM GOAL #1   Title The patient will be indep with HEP for R LE tightness, neck A/ROM, posture, balance and general habituation.   Baseline Target date 08/26/2015   Time 4   Period Weeks   PT SHORT TERM GOAL #2   Title The patient will improve Berg score from 21/56 to > or equal to 29/56 to demo improving steady state balance.   Baseline Patient scored 42/56 on Berg on n10/13/2016   Time 4   Period Weeks   Status Achieved   PT SHORT TERM GOAL #3   Title The patient will improve gait speed from 0.98 ft/sec to > or equal to 1.31 ft/sec  to demo transition to "limited community ambulator" classification of gait.   Baseline Met on 08/25/2015 scoring 1.63 ft/sec with SPC   Time 4   Period Weeks   Status Achieved   PT SHORT TERM GOAL #4   Title The patient will move sit<>supine independently in < or equal to 8 seconds (patient requires min A and prolonged time to complete at eval).   Baseline Not met.  The patient required 11 sec to move sit>supine with back pain and 8.89 seconds  to move supine >sit.   Time 4   Period Weeks   Status Not Met   PT SHORT TERM GOAL #5   Title The patient will ambulate with SPC modified indep x 300 ft to demo improved household/short community ambulation.   Baseline Met on 08/25/2015   Time 4   Period Weeks   Status Achieved           PT Long Term Goals - 08/25/15 1400    PT LONG TERM GOAL #1   Title The patient will return demo progression of HEP for post d/c.   Baseline Target date 09/25/2015   Time 8   Period Weeks   PT LONG TERM GOAL #2   Title The patient will improve Berg from 21/56 to > or equal to 34/56 to demo improving steady state balance for functional mobility.   Baseline Target date 09/25/2015   Time 8   Period Weeks   PT LONG TERM GOAL #3   Title The patient will improve gait speed from 0.98 ft/sec to > or equal to 1.6 ft/sec for improved mobility.   Baseline Target date 09/25/2015   Time 8   Period Weeks   PT LONG TERM GOAL #4   Title The patient will reduce baseline dizziness from 8/10 to < or equal to 4/10 for improved tolerance to movement.   Baseline Target date 09/25/2015   Time 8   Period Weeks   PT LONG TERM GOAL #5   Title The patient will improve functional status score from 40% to > or equal to 52% to demo improved self perception of mobility.   Baseline Target date 09/25/2015   Time 8   Period Weeks               Plan - 08/25/15 1430    Clinical Impression Statement The patient met 4/5 STGs.  She continues with slowed pace moving  sit<>supine.  She does not perform this activity functionally because she sleeps in recliner.  PT recommended she begin to get into/out of bed as this is important movement and would help with overall mobility to stretch out.   PT Next Visit Plan Develop HEP further (add eyes closed/balance), posture/core, matter of balance for increasing confidence post d/c.   Consulted and Agree with Plan of Care Patient        Problem List Patient Active Problem List   Diagnosis Date Noted  . Dizziness and giddiness 11/24/2013  . Diabetes type 2, uncontrolled ([place]) 11/24/2013  . Severe obesity (BMI >= 40) ([place]) 11/24/2013  . Lumbar radiculopathy 11/24/2013    Loucille Takach, PT 08/25/2015, 2:36 PM  [facility] [address] [place], [place], [address] Phone: [phone]   Fax:  [phone]

## 2015-08-29 ENCOUNTER — Ambulatory Visit: Payer: BLUE CROSS/BLUE SHIELD | Admitting: Rehabilitative and Restorative Service Providers"

## 2015-08-31 ENCOUNTER — Ambulatory Visit: Payer: BLUE CROSS/BLUE SHIELD | Admitting: Rehabilitative and Restorative Service Providers"

## 2015-08-31 DIAGNOSIS — R531 Weakness: Secondary | ICD-10-CM

## 2015-08-31 DIAGNOSIS — R269 Unspecified abnormalities of gait and mobility: Secondary | ICD-10-CM

## 2015-08-31 DIAGNOSIS — R42 Dizziness and giddiness: Secondary | ICD-10-CM

## 2015-08-31 NOTE — Patient Instructions (Signed)
Feet Apart, Varied Arm Positions - Eyes Closed    Stand with feet shoulder width apart and arms at your side. Close eyes and visualize upright position. Hold __20-30__ seconds. Repeat __3__ times per session. Do __1-2__ sessions per day.  Copyright  VHI. All rights reserved.

## 2015-08-31 NOTE — Therapy (Signed)
[facility] [address] [place], [place], [address] Phone: [phone]   Fax:  [phone]  Physical Therapy Treatment  Patient Details  Name: [name] [name] MRN: [id number] Date of Birth: Dec 11, 1950 No Data Recorded  Encounter Date: 08/31/2015      PT End of Session - 08/31/15 0921    Visit Number 8   Number of Visits 16   Date for PT Re-Evaluation 09/25/15   Authorization Type private insurance   PT Start Time [phone]   PT Stop Time 0930   PT Time Calculation (min) 40 min   Equipment Utilized During Treatment Gait belt   Activity Tolerance Patient tolerated treatment well   Behavior During Therapy [facility] for tasks assessed/performed      Past Medical History  Diagnosis Date  . HTN (hypertension)   . Depression   . Hyperlipidemia   . Polyneuropathy, diabetic     WALKS W/ CANE FOR BALANCE  . Type 2 diabetes mellitus   . Right ureteral stone   . Chronic lumbar radiculopathy     L5- S1  right leg  . 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 Arthritis     fingers,right shoulder  . History of kidney stones 05/12/15    surgery   . Frequency of urination   . SUI (stress urinary incontinence, female)   . Wears glasses   . OSA (obstructive sleep apnea)     moderate OSA per study 11-22-2007--  refused CPAP but used oxygen for 6 months at night,  states due to wt loss stopped using oxygen    Past Surgical History  Procedure Laterality Date  . Tubal ligation  1985  . Extracorporeal shock wave lithotripsy Right 08-18-2012  . Cardiovascular stress test  09-30-2007      normal Adenosine study/  no ischemia /  normal LV function and wall motion , ef 82%  . Transthoracic echocardiogram  09-23-2007    pseudonormal LV filling pattern,  ef 65-70%/  trivial AR/  mild LAE  . Cystoscopy with retrograde pyelogram, ureteroscopy and stent placement Right 05/12/2015    Procedure: CYSTOSCOPY  WITH RETROGRADE PYELOGRAM, URETEROSCOPY,STONE EXTRACTION AND STENT PLACEMENT;  Surgeon: Franchot Gallo, MD;  Location: [facility];  Service: Urology;  Laterality: Right;  . Holmium laser application Right 4/74/2595    Procedure: HOLMIUM LASER APPLICATION;  Surgeon: Franchot Gallo, MD;  Location: [facility];  Service: Urology;  Laterality: Right;    There were no vitals filed for this visit.  Visit Diagnosis:  Abnormality of gait  Dizziness and giddiness  Generalized weakness      Subjective Assessment - 08/31/15 0855    Subjective The patient had a fall on Friday after having her hair cut and stepping off of a curb.  She needed assist to get up, and reports no injury or bruising from fall.     Patient Stated Goals Be able to walk again.   Currently in Pain? No/denies           [facility] Adult PT Treatment/Exercise - 08/31/15 0916    Ambulation/Gait   Ambulation/Gait Yes   Ambulation/Gait Assistance 5: Supervision   Ambulation/Gait Assistance Details with metronome for pacing and "power" cues for active posture and longer stride   Ambulation Distance (Feet) 345 Feet  ft x 3 reps    Assistive device None   Gait Pattern Decreased arm swing - left;Decreased  arm swing - right   Ambulation Surface Level   Neuro Re-ed    Neuro Re-ed Details  Rock and reach activities emphasizing "power" movements with trunk rotation and UE reaching and weight shift ant/posteriorly, trunk rotation with lateral stepping in standing emphasizing balance and trunk rotation with CGA for safety.  Standing trunk activities "punching" anteriorly with UE cues, reaching overhead with lateral weight shifting.                 PT Education - 08/31/15 0929    Education provided Yes   Education Details HEP: eyes closed feet apart   Person(s) Educated Patient   Methods Explanation;Demonstration;Handout   Comprehension Verbalized understanding;Returned demonstration           PT Short Term Goals - 08/31/15 0921    PT SHORT TERM GOAL #1   Title The patient will be indep with HEP for R LE tightness, neck A/ROM, posture, balance and general habituation.   Baseline Met on 08/31/2015   Time 4   Period Weeks   Status Achieved   PT SHORT TERM GOAL #2   Title The patient will improve Berg score from 21/56 to > or equal to 29/56 to demo improving steady state balance.   Baseline Patient scored 42/56 on Berg on n10/13/2016   Time 4   Period Weeks   Status Achieved   PT SHORT TERM GOAL #3   Title The patient will improve gait speed from 0.98 ft/sec to > or equal to 1.31 ft/sec to demo transition to "limited community ambulator" classification of gait.   Baseline Met on 08/25/2015 scoring 1.63 ft/sec with SPC   Time 4   Period Weeks   Status Achieved   PT SHORT TERM GOAL #4   Title The patient will move sit<>supine independently in < or equal to 8 seconds (patient requires min A and prolonged time to complete at eval).   Baseline Not met.  The patient required 11 sec to move sit>supine with back pain and 8.89 seconds to move supine >sit.   Time 4   Period Weeks   Status Not Met   PT SHORT TERM GOAL #5   Title The patient will ambulate with SPC modified indep x 300 ft to demo improved household/short community ambulation.   Baseline Met on 08/25/2015   Time 4   Period Weeks   Status Achieved           PT Long Term Goals - 08/25/15 1400    PT LONG TERM GOAL #1   Title The patient will return demo progression of HEP for post d/c.   Baseline Target date 09/25/2015   Time 8   Period Weeks   PT LONG TERM GOAL #2   Title The patient will improve Berg from 21/56 to > or equal to 34/56 to demo improving steady state balance for functional mobility.   Baseline Target date 09/25/2015   Time 8   Period Weeks   PT LONG TERM GOAL #3   Title The patient will improve gait speed from 0.98 ft/sec to > or equal to 1.6 ft/sec for improved mobility.    Baseline Target date 09/25/2015   Time 8   Period Weeks   PT LONG TERM GOAL #4   Title The patient will reduce baseline dizziness from 8/10 to < or equal to 4/10 for improved tolerance to movement.   Baseline Target date 09/25/2015   Time 8   Period Weeks   PT LONG  TERM GOAL #5   Title The patient will improve functional status score from 40% to > or equal to 52% to demo improved self perception of mobility.   Baseline Target date 09/25/2015   Time 8   Period Weeks               Plan - 08/31/15 6629    Clinical Impression Statement The patient has met 5/5 STGs.  PT encouraging trunk rotation with reciprocal gait activities.  Continue working towards [employer].  Will further assess whether bifocals may be contributing to falls on steps as 2 recent falls occurred when stepping down from surfaces (one from MD table onto step and one off of a curb).    PT Next Visit Plan Develop HEP further (add eyes closed/balance), posture/core, matter of balance for increasing confidence post d/c.   Consulted and Agree with Plan of Care Patient        Problem List Patient Active Problem List   Diagnosis Date Noted  . Dizziness and giddiness 11/24/2013  . Diabetes type 2, uncontrolled ([place]) 11/24/2013  . Severe obesity (BMI >= 40) ([place]) 11/24/2013  . Lumbar radiculopathy 11/24/2013    Louna Rothgeb, PT 08/31/2015, 10:06 AM  [facility] [address] [place], [place], [address] Phone: [phone]   Fax:  [phone]  Name: [name] [name] MRN: [id number] Date of Birth: November 12, 1951

## 2015-09-01 ENCOUNTER — Emergency Department (HOSPITAL_COMMUNITY)
Admission: EM | Admit: 2015-09-01 | Discharge: 2015-09-01 | Disposition: A | Discharge status: Home / Self Care | Payer: BLUE CROSS/BLUE SHIELD | Attending: Emergency Medicine | Admitting: Emergency Medicine

## 2015-09-01 ENCOUNTER — Encounter (HOSPITAL_COMMUNITY): Payer: Self-pay | Admitting: Emergency Medicine

## 2015-09-01 ENCOUNTER — Emergency Department (HOSPITAL_COMMUNITY): Payer: BLUE CROSS/BLUE SHIELD

## 2015-09-01 DIAGNOSIS — Z7951 Long term (current) use of inhaled steroids: Secondary | ICD-10-CM | POA: Diagnosis not present

## 2015-09-01 DIAGNOSIS — Z7982 Long term (current) use of aspirin: Secondary | ICD-10-CM | POA: Diagnosis not present

## 2015-09-01 DIAGNOSIS — I10 Essential (primary) hypertension: Secondary | ICD-10-CM | POA: Insufficient documentation

## 2015-09-01 DIAGNOSIS — Y9389 Activity, other specified: Secondary | ICD-10-CM | POA: Diagnosis not present

## 2015-09-01 DIAGNOSIS — Z8742 Personal history of other diseases of the female genital tract: Secondary | ICD-10-CM | POA: Insufficient documentation

## 2015-09-01 DIAGNOSIS — M159 Polyosteoarthritis, unspecified: Secondary | ICD-10-CM | POA: Diagnosis not present

## 2015-09-01 DIAGNOSIS — Z87442 Personal history of urinary calculi: Secondary | ICD-10-CM | POA: Insufficient documentation

## 2015-09-01 DIAGNOSIS — F329 Major depressive disorder, single episode, unspecified: Secondary | ICD-10-CM | POA: Diagnosis not present

## 2015-09-01 DIAGNOSIS — Y92410 Unspecified street and highway as the place of occurrence of the external cause: Secondary | ICD-10-CM | POA: Insufficient documentation

## 2015-09-01 DIAGNOSIS — W19XXXA Unspecified fall, initial encounter: Secondary | ICD-10-CM

## 2015-09-01 DIAGNOSIS — S299XXA Unspecified injury of thorax, initial encounter: Secondary | ICD-10-CM | POA: Insufficient documentation

## 2015-09-01 DIAGNOSIS — Z793 Long term (current) use of hormonal contraceptives: Secondary | ICD-10-CM | POA: Insufficient documentation

## 2015-09-01 DIAGNOSIS — Z79899 Other long term (current) drug therapy: Secondary | ICD-10-CM | POA: Diagnosis not present

## 2015-09-01 DIAGNOSIS — E1142 Type 2 diabetes mellitus with diabetic polyneuropathy: Secondary | ICD-10-CM | POA: Diagnosis not present

## 2015-09-01 DIAGNOSIS — Y998 Other external cause status: Secondary | ICD-10-CM | POA: Diagnosis not present

## 2015-09-01 DIAGNOSIS — E785 Hyperlipidemia, unspecified: Secondary | ICD-10-CM | POA: Diagnosis not present

## 2015-09-01 DIAGNOSIS — G4733 Obstructive sleep apnea (adult) (pediatric): Secondary | ICD-10-CM | POA: Insufficient documentation

## 2015-09-01 DIAGNOSIS — W01198A Fall on same level from slipping, tripping and stumbling with subsequent striking against other object, initial encounter: Secondary | ICD-10-CM | POA: Diagnosis not present

## 2015-09-01 DIAGNOSIS — Z7984 Long term (current) use of oral hypoglycemic drugs: Secondary | ICD-10-CM | POA: Diagnosis not present

## 2015-09-01 DIAGNOSIS — W1839XA Other fall on same level, initial encounter: Secondary | ICD-10-CM | POA: Insufficient documentation

## 2015-09-01 DIAGNOSIS — M546 Pain in thoracic spine: Secondary | ICD-10-CM

## 2015-09-01 DIAGNOSIS — Z9981 Dependence on supplemental oxygen: Secondary | ICD-10-CM | POA: Insufficient documentation

## 2015-09-01 DIAGNOSIS — Z973 Presence of spectacles and contact lenses: Secondary | ICD-10-CM | POA: Insufficient documentation

## 2015-09-01 MED ORDER — METHOCARBAMOL 500 MG PO TABS: 500.0000 mg | ORAL_TABLET | Freq: Two times a day (BID) | ORAL | Status: DC

## 2015-09-01 MED ORDER — ACETAMINOPHEN 325 MG PO TABS
650.0000 mg | ORAL_TABLET | Freq: Once | ORAL | Status: AC
  Administered 2015-09-01: 650 mg via ORAL
  Filled 2015-09-01: qty 2

## 2015-09-01 NOTE — ED Provider Notes (Signed)
CSN: [id number]     Arrival date & time 09/01/15  1843 History   First MD Initiated Contact with Patient 09/01/15 1851     Chief Complaint  Patient presents with  . Fall   HPI  [name] [name] is a 64 year old female with PMHx of HTN, DM with neuropathy, arthritis and chronic back pain presenting after a fall. Pt states she was attempting to step through a door way when she lost her balance and fell backwards. She landed on her back without striking her head on the ground. She did not lose consciousness. Pt reports she is having mid-thoracic back pain after the fall. Pain is increased with movement; example of rolling over on the stretcher was given by pt. Pt was ambulatory after the fall. She reports that she has peripheral neuropathy which makes contributes to her falls. she walks with a cane for balance. She recently fell a week ago but did not seek medical attention at that time. Denies dizziness, lightheadedness, headache, neck pain, chest pain, SOB, abdominal pain, nausea, vomiting, numbness/weakness in the extremities, bowel/bladder incontinence or other injuries sustained in the fall.   Past Medical History  Diagnosis Date  . HTN (hypertension)   . Depression   . Hyperlipidemia   . Polyneuropathy, diabetic ([place])     WALKS W/ CANE FOR BALANCE  . Type 2 diabetes mellitus ([place])   . Right ureteral stone   . Chronic lumbar radiculopathy     L5- S1  right leg  . 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 Arthritis     fingers,right shoulder  . History of kidney stones 05/12/15    surgery   . Frequency of urination   . SUI (stress urinary incontinence, female)   . Wears glasses   . OSA (obstructive sleep apnea)     moderate OSA per study 11-22-2007--  refused CPAP but used oxygen for 6 months at night,  states due to wt loss stopped using oxygen   Past Surgical History  Procedure Laterality Date  . Tubal ligation  1985  . Extracorporeal shock wave  lithotripsy Right 08-18-2012  . Cardiovascular stress test  09-30-2007      normal Adenosine study/  no ischemia /  normal LV function and wall motion , ef 82%  . Transthoracic echocardiogram  09-23-2007    pseudonormal LV filling pattern,  ef 65-70%/  trivial AR/  mild LAE  . Cystoscopy with retrograde pyelogram, ureteroscopy and stent placement Right 05/12/2015    Procedure: CYSTOSCOPY WITH RETROGRADE PYELOGRAM, URETEROSCOPY,STONE EXTRACTION AND STENT PLACEMENT;  Surgeon: Franchot Gallo, MD;  Location: [facility];  Service: Urology;  Laterality: Right;  . Holmium laser application Right 3/41/9379    Procedure: HOLMIUM LASER APPLICATION;  Surgeon: Franchot Gallo, MD;  [facility];  Service: Urology;  Laterality: Right;   Family History  Problem Relation Age of Onset  . Hypertension    . Stroke    . Heart Problems Father    Social History  Substance Use Topics  . Smoking status: Never Smoker   . Smokeless tobacco: Never Used  . Alcohol Use: Yes     Comment: rare   OB History    No data available     Review of Systems  Musculoskeletal: Positive for back pain and gait problem. Negative for arthralgias and neck pain.  Skin: Negative for wound.  All other systems reviewed and  are negative.     Allergies  Aleve and Zocor  Home Medications   Prior to Admission medications   Medication Sig Start Date End Date Taking? Authorizing Provider  acetaminophen (TYLENOL) 650 MG CR tablet Take 1,300 mg by mouth every 8 (eight) hours as needed for pain.    Yes Historical Provider, MD  aspirin 81 MG tablet Take 81 mg by mouth daily.   Yes Historical Provider, MD  azelastine (ASTELIN) 0.1 % nasal spray Place 1 spray into both nostrils 2 (two) times daily.  12/23/14  Yes Historical Provider, MD  Cholecalciferol (VITAMIN D PO) Take 5,000 Units by mouth daily.   Yes Historical Provider, MD  diphenhydramine-acetaminophen (TYLENOL PM) 25-500 MG TABS  Take 1 tablet by mouth at bedtime as needed (pain).    Yes Historical Provider, MD  escitalopram (LEXAPRO) 20 MG tablet Take 10 mg by mouth every morning.  10/28/14  Yes Historical Provider, MD  fenofibrate 160 MG tablet Take 160 mg by mouth every morning.    Yes Historical Provider, MD  fluticasone (FLONASE) 50 MCG/ACT nasal spray Place 2 sprays into both nostrils every morning.  12/23/14  Yes Historical Provider, MD  loratadine (CLARITIN) 10 MG tablet Take 10 mg by mouth every morning.   Yes Historical Provider, MD  losartan (COZAAR) 100 MG tablet Take 50 mg by mouth every morning.   Yes Historical Provider, MD  metFORMIN (GLUCOPHAGE) 1000 MG tablet Take 500 mg by mouth 2 (two) times daily with a meal.    Yes Historical Provider, MD  montelukast (SINGULAIR) 10 MG tablet Take 10 mg by mouth at bedtime.   Yes Historical Provider, MD  nystatin cream (MYCOSTATIN) Apply 1 application topically 2 (two) times daily as needed for dry skin.   Yes Historical Provider, MD  progesterone (PROMETRIUM) 100 MG capsule Take 100 mg by mouth at bedtime. 12/20/14  Yes Historical Provider, MD  Menthol (PERFORM PAIN RELIEVING) 3.1 % GEL Apply topically as needed (KNEE PAIN).    Historical Provider, MD  methocarbamol (ROBAXIN) 500 MG tablet Take 1 tablet (500 mg total) by mouth 2 (two) times daily. 09/01/15   Masyn Rostro, PA-C  trolamine salicylate (ASPERCREME) 10 % cream Apply 1 application topically as needed for muscle pain.    Historical Provider, MD   BP 154/72 mmHg  Pulse 78  Temp(Src) 97.6 F (36.4 C) (Oral)  Resp 18  SpO2 95% Physical Exam  Constitutional: She appears well-developed and well-nourished. No distress.  HENT:  Head: Normocephalic and atraumatic.  Eyes: Conjunctivae are normal. Right eye exhibits no discharge. Left eye exhibits no discharge. No scleral icterus.  Neck: Normal range of motion.  Cardiovascular: Normal rate, regular rhythm and normal heart sounds.   Pulmonary/Chest: Effort  normal and breath sounds normal. No respiratory distress. She has no wheezes. She has no rales.  Abdominal: Soft. There is no tenderness. There is no rebound and no guarding.  Obese  Musculoskeletal: Normal range of motion.  TTP over spinous processes and paraspinous muscles of mid-thoracic spine. No spasm felt. No cervical or lumbar spine tenderness. Moves extremities spontaneously. Walks around her room with assistance of a cane and with steady gait.   Neurological: She is alert. Coordination normal.  5/5 strength of major muscle groups. Sensation to light touch intact.   Skin: Skin is warm and dry.  Psychiatric: She has a normal mood and affect. Her behavior is normal.  Nursing note and vitals reviewed.   ED Course  Procedures (including critical care  time) Labs Review Labs Reviewed - No data to display  Imaging Review Dg Cervical Spine Complete  09/01/2015  CLINICAL DATA:  [address] [address] backwards with blunt trauma to the head and neck with persistent pain, initial encounter EXAM: CERVICAL SPINE  4+ VIEWS COMPARISON:  None. FINDINGS: Seven cervical segments are well visualized. Mild osteophytic changes are noted at C5-6 and C6-7. Mild neural foraminal narrowing is noted at these levels bilaterally. Mild facet hypertrophic changes are seen. The odontoid is within normal limits. No other focal abnormality is seen. IMPRESSION: Degenerative changes without acute abnormality. These are centered most at C5-6 and C6-7. Electronically Signed   By: Inez Catalina M.D.   On: 09/01/2015 20:21   Dg Thoracic Spine 2 View  09/01/2015  CLINICAL DATA:  Back pain after falling 6 days ago diabetic neuropathy and hypertension. Initial encounter. EXAM: THORACIC SPINE 2 VIEWS COMPARISON:  Chest radiographs 09/22/2007. FINDINGS: There are 12 rib-bearing thoracic type vertebral bodies. There is a mild convex right scoliosis with normal lateral alignment. Mild degenerative changes are present throughout the thoracic spine.  No evidence of acute fracture, paraspinal hematoma or widening of the interpedicular distance. IMPRESSION: No evidence of acute thoracic spine injury. Scoliosis and degenerative changes noted. Electronically Signed   By: Richardean Sale M.D.   On: 09/01/2015 20:23   I have personally reviewed and evaluated these images and lab results as part of my medical decision-making.   EKG Interpretation None      MDM   Final diagnoses:  Midline thoracic back pain  Pt presenting with thoracic back pain after a mechanical fall earlier today. She has peripheral neuropathy which she says contributes to her balance issues. No head injury. VSS. Comfortable appearing. TTP over mid-thoracic spinous process and paraspinous muscles. Non-focal neuro exam. Pt ambulatory in room. Spine xrays negative for acute injury.  At this time there does not appear to be any evidence of an acute emergency medical condition and the patient appears stable for discharge with appropriate outpatient follow up. Diagnosis was discussed with patient who verbalizes understanding and is agreeable to discharge. Will send home with robaxin for muscle pain. Return precautions given in discharge paperwork and discussed with pt at bedside. Pt stable for discharge       Josephina Gip, PA-C 09/02/15 0039  Serita Grit, MD 09/03/15 [phone]

## 2015-09-01 NOTE — Discharge Instructions (Signed)
Back Pain, Adult °Back pain is very common in adults. The cause of back pain is rarely dangerous and the pain often gets better over time. The cause of your back pain may not be known. Some common causes of back pain include: °· Strain of the muscles or ligaments supporting the spine. °· Wear and tear (degeneration) of the spinal disks. °· Arthritis. °· Direct injury to the back. °For many people, back pain may return. Since back pain is rarely dangerous, most people can learn to manage this condition on their own. °HOME CARE INSTRUCTIONS °Watch your back pain for any changes. The following actions may help to lessen any discomfort you are feeling: °· Remain active. It is stressful on your back to sit or stand in one place for long periods of time. Do not sit, drive, or stand in one place for more than 30 minutes at a time. Take short walks on even surfaces as soon as you are able. Try to increase the length of time you walk each day. °· Exercise regularly as directed by your health care provider. Exercise helps your back heal faster. It also helps avoid future injury by keeping your muscles strong and flexible. °· Do not stay in bed. Resting more than 1-2 days can delay your recovery. °· Pay attention to your body when you bend and lift. The most comfortable positions are those that put less stress on your recovering back. Always use proper lifting techniques, including: °· Bending your knees. °· Keeping the load close to your body. °· Avoiding twisting. °· Find a comfortable position to sleep. Use a firm mattress and lie on your side with your knees slightly bent. If you lie on your back, put a pillow under your knees. °· Avoid feeling anxious or stressed. Stress increases muscle tension and can worsen back pain. It is important to recognize when you are anxious or stressed and learn ways to manage it, such as with exercise. °· Take medicines only as directed by your health care provider. Over-the-counter  medicines to reduce pain and inflammation are often the most helpful. Your health care provider may prescribe muscle relaxant drugs. These medicines help dull your pain so you can more quickly return to your normal activities and healthy exercise. °· Apply ice to the injured area: °· Put ice in a plastic bag. °· Place a towel between your skin and the bag. °· Leave the ice on for 20 minutes, 2-3 times a day for the first 2-3 days. After that, ice and heat may be alternated to reduce pain and spasms. °· Maintain a healthy weight. Excess weight puts extra stress on your back and makes it difficult to maintain good posture. °SEEK MEDICAL CARE IF: °· You have pain that is not relieved with rest or medicine. °· You have increasing pain going down into the legs or buttocks. °· You have pain that does not improve in one week. °· You have night pain. °· You lose weight. °· You have a fever or chills. °SEEK IMMEDIATE MEDICAL CARE IF:  °· You develop new bowel or bladder control problems. °· You have unusual weakness or numbness in your arms or legs. °· You develop nausea or vomiting. °· You develop abdominal pain. °· You feel faint. °  °This information is not intended to replace advice given to you by your health care provider. Make sure you discuss any questions you have with your health care provider. °  °Document Released: 10/29/2005 Document Revised: 11/19/2014 Document Reviewed: 03/02/2014 °Elsevier Interactive Patient Education ©2016 Elsevier   Inc.  Musculoskeletal Pain Musculoskeletal pain is muscle and boney aches and pains. These pains can occur in any part of the body. Your caregiver may treat you without knowing the cause of the pain. They may treat you if blood or urine tests, X-rays, and other tests were normal.  CAUSES There is often not a definite cause or reason for these pains. These pains may be caused by a type of germ (virus). The discomfort may also come from overuse. Overuse includes working out  too hard when your body is not fit. Boney aches also come from weather changes. Bone is sensitive to atmospheric pressure changes. HOME CARE INSTRUCTIONS   Ask when your test results will be ready. Make sure you get your test results.  Only take over-the-counter or prescription medicines for pain, discomfort, or fever as directed by your caregiver. If you were given medications for your condition, do not drive, operate machinery or power tools, or sign legal documents for 24 hours. Do not drink alcohol. Do not take sleeping pills or other medications that may interfere with treatment.  Continue all activities unless the activities cause more pain. When the pain lessens, slowly resume normal activities. Gradually increase the intensity and duration of the activities or exercise.  During periods of severe pain, bed rest may be helpful. Lay or sit in any position that is comfortable.  Putting ice on the injured area.  Put ice in a bag.  Place a towel between your skin and the bag.  Leave the ice on for 15 to 20 minutes, 3 to 4 times a day.  Follow up with your caregiver for continued problems and no reason can be found for the pain. If the pain becomes worse or does not go away, it may be necessary to repeat tests or do additional testing. Your caregiver may need to look further for a possible cause. SEEK IMMEDIATE MEDICAL CARE IF:  You have pain that is getting worse and is not relieved by medications.  You develop chest pain that is associated with shortness or breath, sweating, feeling sick to your stomach (nauseous), or throw up (vomit).  Your pain becomes localized to the abdomen.  You develop any new symptoms that seem different or that concern you. MAKE SURE YOU:   Understand these instructions.  Will watch your condition.  Will get help right away if you are not doing well or get worse.   This information is not intended to replace advice given to you by your health care  provider. Make sure you discuss any questions you have with your health care provider.   Document Released: 10/29/2005 Document Revised: 01/21/2012 Document Reviewed: 07/03/2013 Elsevier Interactive Patient Education [employer].

## 2015-09-01 NOTE — ED Notes (Signed)
Pt observed walking around room; pt advised and reminded she came to  Emergency room because her legs gave out without warning as well as she had the same episode on last Friday and is why she came to the emergency room and advised that it would be in her best interest of safety to stay in bed.  Pt agreed to stay in bed.

## 2015-09-01 NOTE — ED Notes (Signed)
[facility] presents with a 64 yo female from home; pt returned home from an orthopedist visit when she approached the door, lost balance and fell backward hitting mid to lower back on ground with upper back pain.  Pt stated that she fell last Friday around the [facility] area after getting hair done.  She was about to cross street when she fell backwards then and fell on sidewalk.  Pt states bystanders helped her up and she drove herself home without medical intervention.  Pt has hx of diabetic neuropathy, diabetes, HTN and arthritis.

## 2015-09-01 NOTE — ED Notes (Signed)
Bed: YD28 Expected date:  Expected time:  Means of arrival:  Comments: EMS/50F/fall/back pain

## 2015-09-05 ENCOUNTER — Ambulatory Visit: Payer: BLUE CROSS/BLUE SHIELD | Admitting: Diagnostic Neuroimaging

## 2015-09-05 ENCOUNTER — Ambulatory Visit: Payer: BLUE CROSS/BLUE SHIELD | Admitting: Rehabilitative and Restorative Service Providers"

## 2015-09-06 ENCOUNTER — Encounter: Payer: Self-pay | Admitting: Diagnostic Neuroimaging

## 2015-09-06 ENCOUNTER — Telehealth: Payer: Self-pay | Admitting: Rehabilitative and Restorative Service Providers"

## 2015-09-06 NOTE — Telephone Encounter (Signed)
PT called patient regarding message left yesterday requesting call after a fall last week.   The patient was seen at ED after fall and reports she hurt her back and is now taking muscle relaxers, which make her feel whoozy.  She cancelled visit yesterday due to the fall and wants to cancel for Friday.  She reports feeling "trembly" on medications. PT recommended she use her rollater RW due to not feeling well.  She feels too weak to do HEP. PT recommended only performing activities that she feels safe doing and walking with a device this week.  Patient reports that she has received bad news that a nephew passed away and this is impacting her as well.

## 2015-09-09 ENCOUNTER — Encounter: Payer: BLUE CROSS/BLUE SHIELD | Admitting: Rehabilitative and Restorative Service Providers"

## 2015-09-12 ENCOUNTER — Ambulatory Visit: Payer: BLUE CROSS/BLUE SHIELD | Admitting: Rehabilitative and Restorative Service Providers"

## 2015-09-12 DIAGNOSIS — R531 Weakness: Secondary | ICD-10-CM | POA: Diagnosis not present

## 2015-09-12 DIAGNOSIS — R269 Unspecified abnormalities of gait and mobility: Secondary | ICD-10-CM

## 2015-09-12 NOTE — Therapy (Signed)
[facility] [address] [place], [place], [address] Phone: [phone]   Fax:  [phone]  Physical Therapy Treatment  Patient Details  Name: [name] [name] MRN: [id number] Date of Birth: 1951-04-20 No Data Recorded  Encounter Date: 09/12/2015      PT End of Session - 09/12/15 1353    Visit Number 9   Number of Visits 16   Date for PT Re-Evaluation 09/25/15   Authorization Type private insurance   PT Start Time [phone]   PT Stop Time 0934   PT Time Calculation (min) 44 min   Equipment Utilized During Treatment Gait belt   Activity Tolerance Patient tolerated treatment well   Behavior During Therapy [facility] for tasks assessed/performed      Past Medical History  Diagnosis Date  . HTN (hypertension)   . Depression   . Hyperlipidemia   . Polyneuropathy, diabetic ([place])     WALKS W/ CANE FOR BALANCE  . Type 2 diabetes mellitus ([place])   . Right ureteral stone   . Chronic lumbar radiculopathy     L5- S1  right leg  . 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 Arthritis     fingers,right shoulder  . History of kidney stones 05/12/15    surgery   . Frequency of urination   . SUI (stress urinary incontinence, female)   . Wears glasses   . OSA (obstructive sleep apnea)     moderate OSA per study 11-22-2007--  refused CPAP but used oxygen for 6 months at night,  states due to wt loss stopped using oxygen    Past Surgical History  Procedure Laterality Date  . Tubal ligation  1985  . Extracorporeal shock wave lithotripsy Right 08-18-2012  . Cardiovascular stress test  09-30-2007      normal Adenosine study/  no ischemia /  normal LV function and wall motion , ef 82%  . Transthoracic echocardiogram  09-23-2007    pseudonormal LV filling pattern,  ef 65-70%/  trivial AR/  mild LAE  . Cystoscopy with retrograde pyelogram, ureteroscopy and stent placement Right 05/12/2015    Procedure:  CYSTOSCOPY WITH RETROGRADE PYELOGRAM, URETEROSCOPY,STONE EXTRACTION AND STENT PLACEMENT;  Surgeon: Franchot Gallo, MD;  Location: [facility];  Service: Urology;  Laterality: Right;  . Holmium laser application Right 9/52/8413    Procedure: HOLMIUM LASER APPLICATION;  Surgeon: Franchot Gallo, MD;  Location: [facility];  Service: Urology;  Laterality: Right;    There were no vitals filed for this visit.  Visit Diagnosis:  Abnormality of gait  Generalized weakness      Subjective Assessment - 09/12/15 0858    Subjective The patient had a fall when entering her home after leaving MD appt for knee injection on 09/01/2015.     Pertinent History MRI completed 07/15/2015 with no abnormal imaging.   Patient Stated Goals Be able to walk again.   Currently in Pain? Yes   Pain Score --  knee feels a little tight again   Pain Location Knee   Pain Orientation Right   Pain Descriptors / Indicators Aching;Tightness   Pain Type Chronic pain   Pain Onset More than a month ago   Pain Frequency Intermittent   Aggravating Factors  stretching   Pain Relieving Factors nothing noted            [facility] Adult PT Treatment/Exercise - 09/12/15 2440  Ambulation/Gait   Ambulation/Gait Yes   Ambulation/Gait Assistance 4: Min guard   Ambulation Distance (Feet) 345 Feet  x 2 reps, 230 feet x 3 reps with fatigue   Assistive device Straight cane   Gait Pattern Decreased arm swing - left;Decreased arm swing - right   Ambulation Surface Level   Gait Comments used metronome to progress gait with increased "power" and longer stride length emphasizing arm swing.   Neuro Re-ed    Neuro Re-ed Details  seated on physioball working on postural upright/trunk control with marching x 4 reps each side, trunk rotation with posterior reaching from physioball, UE "w" exericses emphasizing scapular retraction. Standing stepping over obstacles R and L sides iwth metronome for pacing to  increase speed of movement.                  PT Short Term Goals - 08/31/15 6433    PT SHORT TERM GOAL #1   Title The patient will be indep with HEP for R LE tightness, neck A/ROM, posture, balance and general habituation.   Baseline Met on 08/31/2015   Time 4   Period Weeks   Status Achieved   PT SHORT TERM GOAL #2   Title The patient will improve Berg score from 21/56 to > or equal to 29/56 to demo improving steady state balance.   Baseline Patient scored 42/56 on Berg on n10/13/2016   Time 4   Period Weeks   Status Achieved   PT SHORT TERM GOAL #3   Title The patient will improve gait speed from 0.98 ft/sec to > or equal to 1.31 ft/sec to demo transition to "limited community ambulator" classification of gait.   Baseline Met on 08/25/2015 scoring 1.63 ft/sec with SPC   Time 4   Period Weeks   Status Achieved   PT SHORT TERM GOAL #4   Title The patient will move sit<>supine independently in < or equal to 8 seconds (patient requires min A and prolonged time to complete at eval).   Baseline Not met.  The patient required 11 sec to move sit>supine with back pain and 8.89 seconds to move supine >sit.   Time 4   Period Weeks   Status Not Met   PT SHORT TERM GOAL #5   Title The patient will ambulate with SPC modified indep x 300 ft to demo improved household/short community ambulation.   Baseline Met on 08/25/2015   Time 4   Period Weeks   Status Achieved           PT Long Term Goals - 08/25/15 1400    PT LONG TERM GOAL #1   Title The patient will return demo progression of HEP for post d/c.   Baseline Target date 09/25/2015   Time 8   Period Weeks   PT LONG TERM GOAL #2   Title The patient will improve Berg from 21/56 to > or equal to 34/56 to demo improving steady state balance for functional mobility.   Baseline Target date 09/25/2015   Time 8   Period Weeks   PT LONG TERM GOAL #3   Title The patient will improve gait speed from 0.98 ft/sec to > or equal  to 1.6 ft/sec for improved mobility.   Baseline Target date 09/25/2015   Time 8   Period Weeks   PT LONG TERM GOAL #4   Title The patient will reduce baseline dizziness from 8/10 to < or equal to 4/10 for improved tolerance to movement.  Baseline Target date 09/25/2015   Time 8   Period Weeks   PT LONG TERM GOAL #5   Title The patient will improve functional status score from 40% to > or equal to 52% to demo improved self perception of mobility.   Baseline Target date 09/25/2015   Time 8   Period Weeks               Plan - 09/12/15 1353    Clinical Impression Statement The patient continues to have set backs due to falls.  She arrived today more guarded and fatigued due to limiting movement last week after a fall.  PT to progress to LTGs.   PT Next Visit Plan Develop HEP further (add eyes closed/balance), posture/core, matter of balance for increasing confidence post d/c.   Consulted and Agree with Plan of Care Patient        Problem List Patient Active Problem List   Diagnosis Date Noted  . Dizziness and giddiness 11/24/2013  . Diabetes type 2, uncontrolled ([place]) 11/24/2013  . Severe obesity (BMI >= 40) ([place]) 11/24/2013  . Lumbar radiculopathy 11/24/2013    Akeel Reffner, PT 09/12/2015, 1:56 PM  [facility] [address] [place], [place], [address] Phone: [phone]   Fax:  [phone]  Name: [name] [name] MRN: [id number] Date of Birth: [birth year]

## 2015-09-14 ENCOUNTER — Encounter: Payer: Self-pay | Admitting: Diagnostic Neuroimaging

## 2015-09-14 ENCOUNTER — Ambulatory Visit (INDEPENDENT_AMBULATORY_CARE_PROVIDER_SITE_OTHER): Payer: BLUE CROSS/BLUE SHIELD | Admitting: Diagnostic Neuroimaging

## 2015-09-14 VITALS — BP 139/78 | HR 69 | Ht 61.0 in | Wt 227.0 lb

## 2015-09-14 DIAGNOSIS — R42 Dizziness and giddiness: Secondary | ICD-10-CM

## 2015-09-14 DIAGNOSIS — R269 Unspecified abnormalities of gait and mobility: Secondary | ICD-10-CM

## 2015-09-14 DIAGNOSIS — G8191 Hemiplegia, unspecified affecting right dominant side: Secondary | ICD-10-CM

## 2015-09-14 DIAGNOSIS — E1142 Type 2 diabetes mellitus with diabetic polyneuropathy: Secondary | ICD-10-CM

## 2015-09-14 NOTE — Progress Notes (Signed)
PATIENT: [name] [name] DOB: [birth year]  REASON FOR VISIT: follow up HISTORY FROM: patient  Chief Complaint  Patient presents with  . R Hemiparesis    rm 6  . Follow-up    2 month    HISTORY OF PRESENT ILLNESS:  UPDATE 09/14/15: Since last visit, has had 2 more falls. More leaning to right side. Has noted some shaking tremor in hands (early AM) holding a cup. Some intermittent drooling, but improved. No smell or taste loss.   UPDATE 07/12/15: Since last visit, having more foot pain, right knee pain, left hand pain, neck and low back issues. Also with drooling from right mouth. More balance diff. More weakness on right side. Fell on 06/12/15.   UPDATE 05/17/15: Since last visit, now having intermittent left hand numbness (digits 4,5) for past 2 months. Lasts minutes at a time. No triggers. Had more falls (easter and last week). Now has home aid to help her.   UPDATE 01/04/15 (VRP): Since last visit patient has had 4 additional falls, on November 05, 2014, January 2, January 3, November 23 2014. Patient still living alone. She has had several events where she has fallen down and had to call her son to help her stand back up. Patient has looked into assisted living and retirement community's, but at this point they are unaffordable financially. Patient has a sister who lives in [place], [place], whom she may be able to move in with.   UPDATE 10/04/14 (VRP): Since last visit, has fallen down 6 times (2 at [facility], 4 at home). She has fallen tripping over locker room bench, getting on/off gym equipment, tripping at home. Still lives alone with her dog. Driving getting more difficult. Life at home more challenging.   UPDATE 02/26/14 (LL): [name] [name] returns for followup visit, last visit was 3 months ago.  MRI cervical spine shows some degenerative changes and mild spinal stenosis but none to warrant surgery eval at this time. TSH was normal and B12 was normal and abnormal at 318. Last  Hemoglobin A1c was 7.2.  She has been attending physical therapy for gait and balance training which he states has helped.  She is also joint [facility] and is attending water exercise classes.  She is able to walk without cane now.  PRIOR HPI (11/24/13, VRP): 63 year old left-handed female with hypertension, diabetes, hypercholesteremia, depression, here for evaluation of 2 problems: First dizziness for past 6 months and second low back pain radiating to right leg for past 10 years. Regarding dizziness patient reports 6 month history of intermittent balance and wavering sensation when she first stands up. If she bends over she feels off balance and tends to fall over. She denies any vertigo, double vision, nausea, vomiting, headache. Patient had some numbness and tingling in her feet and legs. She has some neck pain. Regarding low back pain, this is been going on for at least 10 years. She has pain in her lower back, hips, mainly radiating to the right leg. She saw [facility] several years ago and went through physical therapy which helped a little bit. She has some intermittent leg cramping, and uses Tylenol as needed for the pain. Patient took Aleve over-the-counter but this caused a rash/allergy. Patient also has significant stressors in her life, with the death of her husband in 06-17-2013 and death of her father in 18-Jul-2013.    REVIEW OF SYSTEMS: Full 14 system review of systems performed and notable only for: fatigue  hearing loss runny nose flushing eye itch light sens pain cramps depression memory loss dizziness weakness tremors cough abd pain.    ALLERGIES: Allergies  Allergen Reactions  . Aleve [Naproxen] Hives, Shortness Of Breath and Rash    Pt Avoids All Nsaids  . Zocor [Simvastatin] Other (See Comments)    "weird feeling all over body"    HOME MEDICATIONS: Outpatient Prescriptions Prior to Visit  Medication Sig Dispense Refill  . acetaminophen (TYLENOL) 650 MG CR  tablet Take 1,300 mg by mouth every 8 (eight) hours as needed for pain.     Marland Kitchen aspirin 81 MG tablet Take 81 mg by mouth daily.    Marland Kitchen azelastine (ASTELIN) 0.1 % nasal spray Place 1 spray into both nostrils 2 (two) times daily.   11  . Cholecalciferol (VITAMIN D PO) Take 5,000 Units by mouth daily.    . diphenhydramine-acetaminophen (TYLENOL PM) 25-500 MG TABS Take 1 tablet by mouth at bedtime as needed (pain).     Marland Kitchen escitalopram (LEXAPRO) 20 MG tablet Take 10 mg by mouth every morning.   0  . fenofibrate 160 MG tablet Take 160 mg by mouth every morning.     . fluticasone (FLONASE) 50 MCG/ACT nasal spray Place 2 sprays into both nostrils every morning.   11  . loratadine (CLARITIN) 10 MG tablet Take 10 mg by mouth every morning.    Marland Kitchen losartan (COZAAR) 100 MG tablet Take 50 mg by mouth every morning.    . Menthol (PERFORM PAIN RELIEVING) 3.1 % GEL Apply topically as needed (KNEE PAIN).    Marland Kitchen metFORMIN (GLUCOPHAGE) 1000 MG tablet Take 500 mg by mouth 2 (two) times daily with a meal.     . methocarbamol (ROBAXIN) 500 MG tablet Take 1 tablet (500 mg total) by mouth 2 (two) times daily. 20 tablet 0  . montelukast (SINGULAIR) 10 MG tablet Take 10 mg by mouth at bedtime.    Marland Kitchen nystatin cream (MYCOSTATIN) Apply 1 application topically 2 (two) times daily as needed for dry skin.    . progesterone (PROMETRIUM) 100 MG capsule Take 100 mg by mouth at bedtime.  2  . trolamine salicylate (ASPERCREME) 10 % cream Apply 1 application topically as needed for muscle pain.     No facility-administered medications prior to visit.     PHYSICAL EXAM  Filed Vitals:   09/14/15 1455  BP: 139/78  Pulse: 69  Height: 5\' 1"  (1.549 m)  Weight: 227 lb (102.967 kg)   Body mass index is 42.91 kg/(m^2).   GENERAL EXAM: Patient is in no distress; well developed, nourished and groomed; neck is supple  CARDIOVASCULAR: Regular rate and rhythm, no murmurs, no carotid bruits  NEUROLOGIC: MENTAL STATUS: awake, alert,  language fluent, comprehension intact, naming intact, fund of knowledge appropriate CRANIAL NERVE: pupils equal and reactive to light, visual fields full to confrontation, MILD BILATERAL PTOSIS, extraocular muscles intact, no nystagmus, facial sensation symmetric; DECR RIGHT NL FOLD; hearing intact, palate elevates symmetrically, uvula midline, shoulder shrug symmetric, tongue midline. MILD MASKED FACIES. MOTOR: normal bulk and tone, FULL STRENGTH IN BUE; RLE HF 3 OTHERWISE 5. LLE 5. SLOW TAPPING IN RUE AND RLE. NO COGWHEELING. SENSORY: normal and symmetric to light touch; DECR IN FEET COORDINATION: finger-nose-finger, fine finger movements normal REFLEXES: BUE TRACE, BLE 0; NO FRONTAL RELEASE SIGNS GAIT/STATION: narrow based gait; SHORT STEPS, MILD SHUFFLING, VERY UNSTEADY. USES CANE. DIFF STANDING UP; RIGHT SIDE SLOWER DURING WALKING   DIAGNOSTIC DATA (LABS, IMAGING, TESTING) - I reviewed  patient records, labs, notes, testing and imaging myself where available.  Lab Results  Component Value Date   HGBA1C 8.0* 12/01/2013   Lab Results  Component Value Date   [id number] 12/01/2013   Lab Results  Component Value Date   TSH 2.360 12/01/2013   No results found for: ESRSEDRATE    10/22/13 MRI BRAIN - Mild chronic microvascular ischemic change. No acute intracranial findings.   10/22/13 MRI LUMBAR - Potentially symptomatic neural encroachment at L5-S1 on the left, but there is no dominant right-sided abnormality.  12/09/13 MR Cervical Spine 1. At C5-6: disc bulging and uncovertebral joint hypertrophy with mild spinal stenosis and severe biforaminal foraminal stenosis  2. At C6-7: disc bulging with mild spinal stenosis and mild spinal stenosis and moderate biforaminal stenosis  3. At C4-5: disc bulging with mild spinal stenosis and mild left foraminal stenosis  4. No cord signal abnormalities.  07/13/15 MRI BRAIN  - Normal MRI brain (without). No change from MRI on  10/22/13.    ASSESSMENT AND PLAN  64 y.o. year old female here with intermittent dizziness for past 6 months and low back pain for past 10 years. Also with intermittent mild left hand numbness since May 2016.  Now with new onset right sided weakness, slurred speech, trouble swallowing since end of July 2016. I thought it may represent new stroke, but MRI brain was normal. Now with right side bradykinesia, intermittent drooling, significant gait difficulty. In light of this progression and unremarkable imaging, may represent a neurodegenerative condition (parkinson's vs parkinson's plus syndrome).    RIGHT SIDE WEAKNESS, SLURRED SPEECH, DYSPHAGIA (since late July 2016): possible new stroke (left brain, subcortical) but MRI brain was normal. Now on aspirin 81mg  daily. Parkinsonism also a possibility.   LEFT HAND NUMBNESS: likely related to intermittent left ulnar neuropathy vs cervical radiculopathy. Advised to avoid compression and monitor. Patient does not want to pursue surgical mgmt or steroid/pain injections.  DIZZINESS/BALANCE ISSUES: Patient describes primary balance difficulty, likely related to diabetic neuropathy, cervical spinal stenosis, lumbar radiculopathy and deconditioning/obesity.  LOW BACK PAIN: Likely related to patient's obesity, degenerative spine disease and lumbar radiculopathy. Patient not interested in additional pain medications or steroid injections.    PLAN:  - continue PT exercises - use rollator walker - may consider carb/levo trial for possible parkinsonism (right sided bradykinesia, balance/postural instability); will ask Dr. [name] for second opinion on parkinsonism during sleep consult next week as well  Return in about 3 months (around 12/15/2015).     Penni Bombard, MD 92/11/1939, 7:40 PM Certified in Neurology, Neurophysiology and Neuroimaging  [facility] [address] [place],  [address] [phone]

## 2015-09-14 NOTE — Patient Instructions (Signed)
Thank you for coming to see [place] at [facility]. I hope we have been able to provide you high quality care today.  You may receive a patient satisfaction survey over the next few weeks. We would appreciate your feedback and comments so that we may continue to improve ourselves and the health of our patients.  - use rollator walker   ~~~~~~~~~~~~~~~~~~~~~~~~~~~~~~~~~~~~~~~~~~~~~~~~~~~~~~~~~~~~~~~~~  DR. PENUMALLI'S GUIDE TO HAPPY AND HEALTHY LIVING These are some of my general health and wellness recommendations. Some of them may apply to you better than others. Please use common sense as you try these suggestions and feel free to ask me any questions.   ACTIVITY/FITNESS Mental, social, emotional and physical stimulation are very important for brain and body health. Try learning a new activity (arts, music, language, sports, games).  Keep moving your body to the best of your abilities. Use your walker and continue physical therapy exercises.    NUTRITION Eat more plants: colorful vegetables, nuts, seeds and berries.  Eat less sugar, salt, preservatives and processed foods.  Avoid toxins such as cigarettes and alcohol.  Drink water when you are thirsty. Warm water with a slice of lemon is an excellent morning drink to start the day.  Consider these websites for more information The Nutrition Source (https://www.henry-hernandez.biz/) [employer] Nutrition (WindowBlog.ch)   RELAXATION Consider practicing mindfulness meditation or other relaxation techniques such as deep breathing, prayer, yoga, tai chi, massage. See website mindful.org or the apps Headspace or Calm to help get started.   SLEEP Try to get at least 7-8+ hours sleep per day. Regular exercise and reduced caffeine will help you sleep better. Practice good sleep hygeine techniques. See website sleep.org for more information.   PLANNING Prepare estate planning,  living will, healthcare POA documents. Sometimes this is best planned with the help of an attorney. Theconversationproject.org and agingwithdignity.org are excellent resources.

## 2015-09-15 ENCOUNTER — Encounter: Payer: BLUE CROSS/BLUE SHIELD | Admitting: Rehabilitative and Restorative Service Providers"

## 2015-09-19 ENCOUNTER — Ambulatory Visit
Admission: RE | Admit: 2015-09-19 | Discharge: 2015-09-19 | Disposition: A | Discharge status: Home / Self Care | Payer: BLUE CROSS/BLUE SHIELD | Source: Ambulatory Visit | Attending: Family Medicine | Admitting: Family Medicine

## 2015-09-19 DIAGNOSIS — Z1231 Encounter for screening mammogram for malignant neoplasm of breast: Secondary | ICD-10-CM

## 2015-09-20 ENCOUNTER — Ambulatory Visit
Payer: BLUE CROSS/BLUE SHIELD | Attending: Family Medicine | Admitting: Rehabilitative and Restorative Service Providers"

## 2015-09-20 ENCOUNTER — Encounter: Payer: Self-pay | Admitting: Neurology

## 2015-09-20 ENCOUNTER — Ambulatory Visit (INDEPENDENT_AMBULATORY_CARE_PROVIDER_SITE_OTHER): Payer: BLUE CROSS/BLUE SHIELD | Admitting: Neurology

## 2015-09-20 VITALS — BP 136/78 | HR 70 | Resp 18 | Ht 62.0 in | Wt 222.8 lb

## 2015-09-20 DIAGNOSIS — R42 Dizziness and giddiness: Secondary | ICD-10-CM | POA: Diagnosis present

## 2015-09-20 DIAGNOSIS — R269 Unspecified abnormalities of gait and mobility: Secondary | ICD-10-CM

## 2015-09-20 DIAGNOSIS — E1142 Type 2 diabetes mellitus with diabetic polyneuropathy: Secondary | ICD-10-CM

## 2015-09-20 DIAGNOSIS — R51 Headache: Secondary | ICD-10-CM

## 2015-09-20 DIAGNOSIS — G4733 Obstructive sleep apnea (adult) (pediatric): Secondary | ICD-10-CM | POA: Diagnosis not present

## 2015-09-20 DIAGNOSIS — R531 Weakness: Secondary | ICD-10-CM | POA: Diagnosis present

## 2015-09-20 DIAGNOSIS — G4719 Other hypersomnia: Secondary | ICD-10-CM | POA: Diagnosis not present

## 2015-09-20 DIAGNOSIS — R519 Headache, unspecified: Secondary | ICD-10-CM

## 2015-09-20 DIAGNOSIS — R351 Nocturia: Secondary | ICD-10-CM

## 2015-09-20 DIAGNOSIS — R296 Repeated falls: Secondary | ICD-10-CM

## 2015-09-20 NOTE — Therapy (Signed)
[facility] [address] [place], [place], [address] Phone: [phone]   Fax:  [phone]  Physical Therapy Treatment  Patient Details  Name: [name] [name] MRN: [id number] Date of Birth: 1951-05-23 No Data Recorded  Encounter Date: 09/20/2015      PT End of Session - 09/20/15 1430    Visit Number 10   Number of Visits 16   Date for PT Re-Evaluation 09/25/15   Authorization Type private insurance   PT Start Time 6301   PT Stop Time 1315   PT Time Calculation (min) 40 min   Equipment Utilized During Treatment Gait belt   Activity Tolerance Patient tolerated treatment well   Behavior During Therapy [facility] for tasks assessed/performed      Past Medical History  Diagnosis Date  . HTN (hypertension)   . Depression   . Hyperlipidemia   . Polyneuropathy, diabetic ([place])     WALKS W/ CANE FOR BALANCE  . Type 2 diabetes mellitus ([place])   . Right ureteral stone   . Chronic lumbar radiculopathy     L5- S1  right leg  . 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 Arthritis     fingers,right shoulder  . History of kidney stones 05/12/15    surgery   . Frequency of urination   . SUI (stress urinary incontinence, female)   . Wears glasses   . OSA (obstructive sleep apnea)     moderate OSA per study 11-22-2007--  refused CPAP but used oxygen for 6 months at night,  states due to wt loss stopped using oxygen    Past Surgical History  Procedure Laterality Date  . Tubal ligation  1985  . Extracorporeal shock wave lithotripsy Right 08-18-2012  . Cardiovascular stress test  09-30-2007      normal Adenosine study/  no ischemia /  normal LV function and wall motion , ef 82%  . Transthoracic echocardiogram  09-23-2007    pseudonormal LV filling pattern,  ef 65-70%/  trivial AR/  mild LAE  . Cystoscopy with retrograde pyelogram, ureteroscopy and stent placement Right 05/12/2015    Procedure:  CYSTOSCOPY WITH RETROGRADE PYELOGRAM, URETEROSCOPY,STONE EXTRACTION AND STENT PLACEMENT;  Surgeon: Franchot Gallo, MD;  Location: [facility];  Service: Urology;  Laterality: Right;  . Holmium laser application Right 04/13/931    Procedure: HOLMIUM LASER APPLICATION;  Surgeon: Franchot Gallo, MD;  Location: [facility];  Service: Urology;  Laterality: Right;    There were no vitals filed for this visit.  Visit Diagnosis:  Abnormality of gait  Generalized weakness  Dizziness and giddiness      Subjective Assessment - 09/20/15 1242    Subjective The patient reports a sensation of dizziness that is worse this week.  She notes worsening symptoms with moving her head (even in seated position) and describes it as "off balance" pulling to the right side.  She denies a sensation of room spinning or true vertigo.      The patient reports she feels worse this week due to her nephew unexpectedly passing away 2 weeks ago and having the service last Thursday.  She feels that this has an impact on her mobility due to stress and grieving.   NEUROMUSCULAR RE-EDUCATION: Baseline seated dizziness 5-6/10  Smooth pursuits appear WNLs Saccades WNLs Seated head turns aggravate symptoms of dizziness *Patient has tried home exercises over the past couple of weeks,  but feel fear of falling is worse and she is scared to perform.  THERAPEUTIC EXERCISE: PT reviewed prior HEP including sit<>stand, heel raises, hip abduction with UE support.  The patient is safe with those activities and PT encouraged her to perform to her tolerance.  Gait: Ambulation with SPC with slowed gait speed to 1.033 ft/sec, which is more in line to her gait speed at initial evaluation vs. When performed at date of short term goal check. The patient has decreased arm swing with ambulation, decreased trunk rotation.  She does not have festinating gait pattern, however does have shuffling steps and  inability to coordinate UEs moving with contralateral LE. Patient reports barrier to using rollater RW in the community is that she cannot load it into the car on her own.  PT and patient tried standard folding walker and PT to obtain order for patient to have standard RW.   Patient has increased comfort with RW over [facility], however still has "discomfort" with turns and reports no change in dizziness with this device.     [facility] PT Assessment - 09/20/15 1259    Ambulation/Gait   Ambulation/Gait Yes   Ambulation/Gait Assistance 5: Supervision   Ambulation Distance (Feet) 230 Feet  ft x 3 reps   Assistive device Straight cane;Standard walker   Gait Pattern Decreased arm swing - left;Decreased arm swing - right;Decreased stride length   Gait velocity 1.033 ft/sec           PT Short Term Goals - 08/31/15 2536    PT SHORT TERM GOAL #1   Title The patient will be indep with HEP for R LE tightness, neck A/ROM, posture, balance and general habituation.   Baseline Met on 08/31/2015   Time 4   Period Weeks   Status Achieved   PT SHORT TERM GOAL #2   Title The patient will improve Berg score from 21/56 to > or equal to 29/56 to demo improving steady state balance.   Baseline Patient scored 42/56 on Berg on n10/13/2016   Time 4   Period Weeks   Status Achieved   PT SHORT TERM GOAL #3   Title The patient will improve gait speed from 0.98 ft/sec to > or equal to 1.31 ft/sec to demo transition to "limited community ambulator" classification of gait.   Baseline Met on 08/25/2015 scoring 1.63 ft/sec with SPC   Time 4   Period Weeks   Status Achieved   PT SHORT TERM GOAL #4   Title The patient will move sit<>supine independently in < or equal to 8 seconds (patient requires min A and prolonged time to complete at eval).   Baseline Not met.  The patient required 11 sec to move sit>supine with back pain and 8.89 seconds to move supine >sit.   Time 4   Period Weeks   Status Not Met   PT SHORT TERM  GOAL #5   Title The patient will ambulate with SPC modified indep x 300 ft to demo improved household/short community ambulation.   Baseline Met on 08/25/2015   Time 4   Period Weeks   Status Achieved           PT Long Term Goals - 08/25/15 1400    PT LONG TERM GOAL #1   Title The patient will return demo progression of HEP for post d/c.   Baseline Target date 09/25/2015   Time 8   Period Weeks   PT LONG TERM GOAL #2   Title The  patient will improve Berg from 21/56 to > or equal to 34/56 to demo improving steady state balance for functional mobility.   Baseline Target date 09/25/2015   Time 8   Period Weeks   PT LONG TERM GOAL #3   Title The patient will improve gait speed from 0.98 ft/sec to > or equal to 1.6 ft/sec for improved mobility.   Baseline Target date 09/25/2015   Time 8   Period Weeks   PT LONG TERM GOAL #4   Title The patient will reduce baseline dizziness from 8/10 to < or equal to 4/10 for improved tolerance to movement.   Baseline Target date 09/25/2015   Time 8   Period Weeks   PT LONG TERM GOAL #5   Title The patient will improve functional status score from 40% to > or equal to 52% to demo improved self perception of mobility.   Baseline Target date 09/25/2015   Time 8   Period Weeks           Plan - 09/20/15 1439    Clinical Impression Statement The patient has demonstrated a decline in her status over the past 2-3 weeks initially s/p 2 falls (both while stepping down ? could be related to bifocal use) and then with the death of her nephew.  Prior to those circumstances, she was demonstrating functional gains in PT.  PT recommends and reviewed her HEP to get her moving safely in her home environment.  We plan to discuss discharge with HEP vs. renewing at next session.  The patient has had variable performance.   PT Next Visit Plan Review HEP as needed, discuss d/c vs. renewal checking goals.   Consulted and Agree with Plan of Care Patient         Problem List Patient Active Problem List   Diagnosis Date Noted  . Dizziness and giddiness 11/24/2013  . Diabetes type 2, uncontrolled ([place]) 11/24/2013  . Severe obesity (BMI >= 40) ([place]) 11/24/2013  . Lumbar radiculopathy 11/24/2013    Malacki Mcphearson, PT 09/20/2015, 2:42 PM  [facility] [address] [place], [place], [address] Phone: [phone]   Fax:  [phone]  Name: [name] [name] MRN: [id number] Date of Birth: 1951/04/04

## 2015-09-20 NOTE — Patient Instructions (Signed)
Based on your symptoms and your exam I believe you are at risk for obstructive sleep apnea or OSA, and I think we should proceed with a sleep study to determine whether you do or do not have OSA and how severe it is. If you have more than mild OSA, I want you to consider treatment with CPAP. Please remember, the risks and ramifications of moderate to severe obstructive sleep apnea or OSA are: Cardiovascular disease, including congestive heart failure, stroke, difficult to control hypertension, arrhythmias, and even type 2 diabetes has been linked to untreated OSA. Sleep apnea causes disruption of sleep and sleep deprivation in most cases, which, in turn, can cause recurrent headaches, problems with memory, mood, concentration, focus, and vigilance. Most people with untreated sleep apnea report excessive daytime sleepiness, which can affect their ability to drive. Please do not drive if you feel sleepy.   I will likely see you back after your sleep study to go over the test results and where to go from there. We will call you after your sleep study to advise about the results (most likely, you will hear from Diana, my nurse) and to set up an appointment at the time, as necessary.    Our sleep lab administrative assistant, Dawn will meet with you or call you to schedule your sleep study. If you don't hear back from her by next week please feel free to call her at [phone]. This is her direct line and please leave a message with your phone number to call back if you get the voicemail box. She will call back as soon as possible.   Please remember to try to maintain good sleep hygiene, which means: Keep a regular sleep and wake schedule, try not to exercise or have a meal within 2 hours of your bedtime, try to keep your bedroom conducive for sleep, that is, cool and dark, without light distractors such as an illuminated alarm clock, and refrain from watching TV right before sleep or in the middle of the night  and do not keep the TV or radio on during the night. Also, try not to use or play on electronic devices at bedtime, such as your cell phone, tablet PC or laptop. If you like to read at bedtime on an electronic device, try to dim the background light as much as possible. Do not eat in the middle of the night.     

## 2015-09-20 NOTE — Progress Notes (Signed)
Subjective:    Patient ID: [name] [name] is a 64 y.o. female.  HPI     Star Age, MD, PhD [facility] [address], [address] [place], [place] [address]  Dear Dr. Ernie Hew,   I saw your patient, [name] [name], upon your kind request in my neurologic clinic today for initial consultation of her sleep disorder, in particular, concern for underlying obstructive sleep apnea. The patient is unaccompanied today. As you know, [name] [name] is a 64 year old right-handed woman with an underlying medical history of hypertension, diabetes, hyperlipidemia, LBP, depression, cervical spinal stenosis, obesity, neuropathy, recurrent falls and gait disorder as well as dizziness, for which she has been seeing Dr. [name] in our office since 11/24/2013, who reports a prior diagnosis of OSA. She had a sleep study in 2008 or 9. She was offered CPAP therapy but did not pursue this for fear of being unable to tolerate it. She was placed on oxygen therapy for about 6 months and felt better but then stopped it. She has lost weight and this last year in the realm of 25 pounds. She has been enrolled in [employer]. She does not smoke, she drinks alcohol occasionally but does drink a lot of caffeine in the form of coffee, about 3 cups per day and to bottles of soda. She goes to bed between 8 or 9 and has the TV on which stays on all night. She lives alone and has a small dog. She has 1 grown son who is local. She is retired. She is widowed. She has no family history of obstructive sleep apnea. Rise time is around 7 AM. Her Epworth sleepiness score is 11 out of 24 today, her fatigue score is 49 out of 63. She does not recall any family history of OSA. She would be willing to be tested for sleep apnea again and consider CPAP therapy if the need arises. She reports no family history of Parkinson's disease. She has difficulty with her right side as and feeling weaker on the right side. She is  afraid of falls. She has fallen repeatedly. She is in outpatient physical therapy at the neuro rehabilitation unit. She has nocturia usually twice per night. She has morning headaches about 3-4 times a month. She denies restless leg symptoms or leg twitching in her sleep. Her husband had reported her snoring.  She recently saw Dr. [name] on 09/14/2015 for follow-up. She reported 2 falls recently. I reviewed the office note. He voiced concern that she may have hemi-parkinsonism.  Her Past Medical History Is Significant For: Past Medical History  Diagnosis Date  . HTN (hypertension)   . Depression   . Hyperlipidemia   . Polyneuropathy, diabetic ([place])     WALKS W/ CANE FOR BALANCE  . Type 2 diabetes mellitus ([place])   . Right ureteral stone   . Chronic lumbar radiculopathy     L5- S1  right leg  . 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 Arthritis     fingers,right shoulder  . History of kidney stones 05/12/15    surgery   . Frequency of urination   . SUI (stress urinary incontinence, female)   . Wears glasses   . OSA (obstructive sleep apnea)     moderate OSA per study 11-22-2007--  refused CPAP but used oxygen for 6 months at night,  states due to wt loss stopped using oxygen  Her Past Surgical History Is Significant For: Past Surgical History  Procedure Laterality Date  . Tubal ligation  1985  . Extracorporeal shock wave lithotripsy Right 08-18-2012  . Cardiovascular stress test  09-30-2007      normal Adenosine study/  no ischemia /  normal LV function and wall motion , ef 82%  . Transthoracic echocardiogram  09-23-2007    pseudonormal LV filling pattern,  ef 65-70%/  trivial AR/  mild LAE  . Cystoscopy with retrograde pyelogram, ureteroscopy and stent placement Right 05/12/2015    Procedure: CYSTOSCOPY WITH RETROGRADE PYELOGRAM, URETEROSCOPY,STONE EXTRACTION AND STENT PLACEMENT;  Surgeon: Franchot Gallo, MD;  Location: [facility];  Service: Urology;  Laterality: Right;  . Holmium laser application Right 0/62/6948    Procedure: HOLMIUM LASER APPLICATION;  Surgeon: Franchot Gallo, MD;  Location: [facility];  Service: Urology;  Laterality: Right;    Her Family History Is Significant For: Family History  Problem Relation Age of Onset  . Hypertension    . Stroke    . Heart Problems Father   . Stroke Mother   . Breast cancer Sister     Her Social History Is Significant For: Social History   Social History  . Marital Status: Widowed    Spouse Name: N/A  . Number of Children: 1  . Years of Education: College   Occupational History  . office manager   .     Social History Main Topics  . Smoking status: Never Smoker   . Smokeless tobacco: Never Used  . Alcohol Use: 0.0 oz/week    0 Standard drinks or equivalent per week     Comment: rare  . Drug Use: No  . Sexual Activity: Not Asked   Other Topics Concern  . None   Social History Narrative   Patient lives at home with her dog name "Hot Rod".   Caffeine Use: 2-3 cups of coffee a day        Her Allergies Are:  Allergies  Allergen Reactions  . Aleve [Naproxen] Hives, Shortness Of Breath and Rash    Pt Avoids All Nsaids  . Zocor [Simvastatin] Other (See Comments)    "weird feeling all over body"  :   Her Current Medications Are:  Outpatient Encounter Prescriptions as of 09/20/2015  Medication Sig  . acetaminophen (TYLENOL) 650 MG CR tablet Take 1,300 mg by mouth every 8 (eight) hours as needed for pain.   Marland Kitchen aspirin 81 MG tablet Take 81 mg by mouth daily.  Marland Kitchen azelastine (ASTELIN) 0.1 % nasal spray Place 1 spray into both nostrils 2 (two) times daily.   . Cholecalciferol (VITAMIN D PO) Take 5,000 Units by mouth daily.  . diphenhydramine-acetaminophen (TYLENOL PM) 25-500 MG TABS Take 1 tablet by mouth at bedtime as needed (pain).   Marland Kitchen escitalopram (LEXAPRO) 20 MG tablet Take 10 mg by mouth every morning.   . fenofibrate  160 MG tablet Take 160 mg by mouth every morning.   . fluticasone (FLONASE) 50 MCG/ACT nasal spray Place 2 sprays into both nostrils every morning.   . loratadine (CLARITIN) 10 MG tablet Take 10 mg by mouth every morning.  Marland Kitchen losartan (COZAAR) 100 MG tablet Take 50 mg by mouth every morning.  . Menthol (PERFORM PAIN RELIEVING) 3.1 % GEL Apply topically as needed (KNEE PAIN).  Marland Kitchen metFORMIN (GLUCOPHAGE) 1000 MG tablet Take 500 mg by mouth 2 (two) times daily with a meal.   . methocarbamol (ROBAXIN) 500 MG  tablet Take 1 tablet (500 mg total) by mouth 2 (two) times daily.  . montelukast (SINGULAIR) 10 MG tablet Take 10 mg by mouth at bedtime.  Marland Kitchen nystatin cream (MYCOSTATIN) Apply 1 application topically 2 (two) times daily as needed for dry skin.  . progesterone (PROMETRIUM) 100 MG capsule Take 100 mg by mouth at bedtime.  . trolamine salicylate (ASPERCREME) 10 % cream Apply 1 application topically as needed for muscle pain.   No facility-administered encounter medications on file as of 09/20/2015.  :  Review of Systems:  Out of a complete 14 point review of systems, all are reviewed and negative with the exception of these symptoms as listed below:   Review of Systems  Constitutional: Positive for fatigue.  HENT: Positive for tinnitus and trouble swallowing.   Musculoskeletal:       Joint pain, joint swelling, aching muscles   Neurological: Positive for dizziness, weakness, numbness and headaches.       Had sleep study in 2008/2009. Diagnosed with OSA. Never tried CPAP. Has H/O using oxygen at night.  No trouble falling asleep, has trouble staying asleep, snoring, morning headaches, daytime tiredness, takes nap after 4-5pm.   Hematological: Bruises/bleeds easily.  Psychiatric/Behavioral:       Depression, not enough sleep, decreased energy    Epworth Sleepiness Scale 0= would never doze 1= slight chance of dozing 2= moderate chance of dozing 3= high chance of dozing  Sitting and  reading:2 Watching TV:1 Sitting inactive in a public place (ex. Theater or meeting):1 As a passenger in a car for an hour without a break:2 Lying down to rest in the afternoon:2 Sitting and talking to someone:1 Sitting quietly after lunch (no alcohol):1 In a car, while stopped in traffic:1 Total:11 Objective:  Neurologic Exam  Physical Exam Physical Examination:   Filed Vitals:   09/20/15 0932  BP: 136/78  Pulse: 70  Resp: 18   General Examination: The patient is a very pleasant 64 y.o. female in no acute distress. She appears well-developed and well-nourished and well groomed. She is obese.  HEENT: Normocephalic, atraumatic, pupils are equal, round and reactive to light and accommodation. Extraocular tracking is fairly good with slight breakdown of smooth pursuit. She may have a mild degree of increased tone in her neck. She has fairly good facial animation and perhaps a decrease in eye blink rate. Hearing is grossly intact. Speech is clear with no dysarthria noted. There is no hypophonia. There is no lip, neck/head, jaw or voice tremor. Neck is supple with full range of passive and active motion. There are no carotid bruits on auscultation. Oropharynx exam reveals: moderate mouth dryness, adequate dental hygiene and moderate airway crowding, due to smaller airway entry, larger uvula, and tonsils in place which are actually smaller. Mallampati is class II. Tongue protrudes centrally and palate elevates symmetrically. Neck circumference is 15-1/2 inches.  Chest: Clear to auscultation without wheezing, rhonchi or crackles noted.  Heart: S1+S2+0, regular and normal without murmurs, rubs or gallops noted.   Abdomen: Soft, non-tender and non-distended with normal bowel sounds appreciated on auscultation.  Extremities: There is trace pitting edema in the distal lower extremities bilaterally. Pedal pulses are intact.  Skin: Warm and dry without trophic changes noted. There are no varicose  veins.  Musculoskeletal: exam reveals no obvious joint deformities, tenderness or joint swelling or erythema.   Neurologically:  Mental status: The patient is awake, alert and oriented in all 4 spheres. Her immediate and remote memory, attention, language skills and  fund of knowledge are appropriate. There is no evidence of aphasia, agnosia, apraxia or anomia. Speech is clear with normal prosody and enunciation. Thought process is linear. Mood is normal and affect is normal.  Cranial nerves II - XII are as described above under HEENT exam. In addition: shoulder shrug is normal with equal shoulder height noted. Motor exam: Normal bulk, strength and tone is noted. She has initially a slight decrease in right grip strength but with full effort she seems to have normal strength on the right. Romberg is not tested because she does not stand well narrow based. She stands up fairly wide-based. She uses a cane on her left side. She does have a decrease in arm swing on the right. On fine motor testing, however, she has no difficulty on the right and has no decrement in amplitude in the upper or lower extremities. She has no difficulty with finger to nose testing. Sensory exam: intact to light touch, pinprick, vibration, temperature sense in the upper extremities but decrease to allmodalities in both lower extremities, right more than left, right side up to the knee and left side to mid shin.her balance is impaired. She walks slightly wide-based. She is not able to do tandem walk.  Assessment and Plan:   In summary, MIRAI GREENWOOD is a very pleasant 64 y.o.-year old female with an underlying medical history of hypertension, diabetes, hyperlipidemia, LBP, depression, cervical spinal stenosis, obesity, neuropathy, recurrent falls and gait disorder as well as dizziness, for which she has been seeing Dr. [name] in our office since 11/24/2013, who reports a prior diagnosis of OSA. I had a long chat with the  patient about my findings and the diagnosis of OSA, its prognosis and treatment options. We talked about medical treatments, surgical interventions and non-pharmacological approaches. I explained in particular the risks and ramifications of untreated moderate to severe OSA, especially with respect to developing cardiovascular disease down the Road, including congestive heart failure, difficult to treat hypertension, cardiac arrhythmias, or stroke. Even type 2 diabetes has, in part, been linked to untreated OSA. Symptoms of untreated OSA include daytime sleepiness, memory problems, mood irritability and mood disorder such as depression and anxiety, lack of energy, as well as recurrent headaches, especially morning headaches. We talked about trying to maintain a healthy lifestyle in general, as well as the importance of weight control. I encouraged the patient to eat healthy, exercise daily and keep well hydrated, to keep a scheduled bedtime and wake time routine, to not skip any meals and eat healthy snacks in between meals. I advised the patient not to drive when feeling sleepy. I recommended the following at this time: sleep study with potential positive airway pressure titration. (We will score hypopneas at 3% and split the sleep study into diagnostic and treatment portion, if the estimated. 2 hour AHI is >15/h).in addition, I talked to her about improving her sleep hygiene and cutting back on caffeine. She is advised to drink more water. She does not have any telltale signs of parkinsonism on exam. She does have a slight increase in neck tone and decrease in arm swing on the right. I did not detect any weakness on the right and fine motor skills are actually fairly well-preserved. She has mild difficulty with smooth tracking and perhaps a decrease in eye blink rate. These are subtle signs. Clinical monitoring is prudent and I will copy Dr. [name] on my note today. I would recommend holding off on any  medications for parkinsonism or  Parkinson's disease at this time and continue to monitor her exam.   I explained the sleep test procedure to the patient and also outlined possible surgical and non-surgical treatment options of OSA, including the use of a custom-made dental device (which would require a referral to a specialist dentist or oral surgeon), upper airway surgical options, such as pillar implants, radiofrequency surgery, tongue base surgery, and UPPP (which would involve a referral to an ENT surgeon). Rarely, jaw surgery such as mandibular advancement may be considered.  I also explained the CPAP treatment option to the patient, who indicated that she would be willing to try CPAP if the need arises. I explained the importance of being compliant with PAP treatment, not only for insurance purposes but primarily to improve Her symptoms, and for the patient's long term health benefit, including to reduce Her cardiovascular risks. I answered all her questions today and the patient was in agreement. I would like to see her back after the sleep study is completed and encouraged her to call with any interim questions, concerns, problems or updates.   Thank you very much for allowing me to participate in the care of this nice patient. If I can be of any further assistance to you please do not hesitate to call me at [phone].  Sincerely,   Star Age, MD, PhD

## 2015-09-20 NOTE — Addendum Note (Signed)
Addended by: Star Age on: 09/20/2015 01:06 PM   Modules accepted: Level of Service

## 2015-09-23 ENCOUNTER — Ambulatory Visit: Payer: BLUE CROSS/BLUE SHIELD | Admitting: Rehabilitative and Restorative Service Providers"

## 2015-09-23 DIAGNOSIS — R269 Unspecified abnormalities of gait and mobility: Secondary | ICD-10-CM | POA: Diagnosis not present

## 2015-09-23 DIAGNOSIS — R531 Weakness: Secondary | ICD-10-CM

## 2015-09-23 NOTE — Therapy (Signed)
[facility] [address] [place], [place], [address] Phone: [phone]   Fax:  [phone]  Physical Therapy Treatment  Patient Details  Name: [name] [name] MRN: [id number] Date of Birth: 05/11/1951 No Data Recorded  Encounter Date: 09/23/2015         PT End of Session - 09/25/15 2132    Visit Number 11   Number of Visits 16   Date for PT Re-Evaluation 09/25/15   Authorization Type private insurance   PT Start Time 1318   PT Stop Time 1403   PT Time Calculation (min) 45 min   Equipment Utilized During Treatment Gait belt   Activity Tolerance Patient tolerated treatment well   Behavior During Therapy [facility] for tasks assessed/performed       Past Medical History  Diagnosis Date  . HTN (hypertension)   . Depression   . Hyperlipidemia   . Polyneuropathy, diabetic ([place])     WALKS W/ CANE FOR BALANCE  . Type 2 diabetes mellitus ([place])   . Right ureteral stone   . Chronic lumbar radiculopathy     L5- S1  right leg  . 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 Arthritis     fingers,right shoulder  . History of kidney stones 05/12/15    surgery   . Frequency of urination   . SUI (stress urinary incontinence, female)   . Wears glasses   . OSA (obstructive sleep apnea)     moderate OSA per study 11-22-2007--  refused CPAP but used oxygen for 6 months at night,  states due to wt loss stopped using oxygen    Past Surgical History  Procedure Laterality Date  . Tubal ligation  1985  . Extracorporeal shock wave lithotripsy Right 08-18-2012  . Cardiovascular stress test  09-30-2007      normal Adenosine study/  no ischemia /  normal LV function and wall motion , ef 82%  . Transthoracic echocardiogram  09-23-2007    pseudonormal LV filling pattern,  ef 65-70%/  trivial AR/  mild LAE  . Cystoscopy with retrograde pyelogram, ureteroscopy and stent placement Right 05/12/2015     Procedure: CYSTOSCOPY WITH RETROGRADE PYELOGRAM, URETEROSCOPY,STONE EXTRACTION AND STENT PLACEMENT;  Surgeon: Franchot Gallo, MD;  Location: [facility];  Service: Urology;  Laterality: Right;  . Holmium laser application Right 04/18/3709    Procedure: HOLMIUM LASER APPLICATION;  Surgeon: Franchot Gallo, MD;  Location: [facility];  Service: Urology;  Laterality: Right;    There were no vitals filed for this visit.  Visit Diagnosis:  No diagnosis found.      Subjective Assessment - 09/23/15 1321    Subjective The patient reports urology visit and needing another MRI due to cyst found on kidney.  No falls this week.  The patient was able to return to doing HEP as therapy reviewed earlier this week.   Patient Stated Goals Be able to walk again.   Currently in Pain? No/denies      THERAPEUTIC EXERCISE: Seated trunk rotation reaching across midline 5 times to each side Seated UE reaching with lateral pelvic rocking 5 times to each side Seated forward flexion and then upright emphasizing scapular retraction x 5 reps Sit<>stand x 5 reps with reaching and upright posture cues sci-fit x 6 minutes at level 2.1 with UEs/LEs for endurance and strengthening  Gait: Ambulation without device with metronome for pacing x 345  feet, 230 feet with cues on "power" walking and longer stride length. Cues on posture and arm swing provided as well.   NEUROMUSCULAR RE-EDUCATION: Rock and reach [employer] exercises STanding R and L rotation for trunk and balance  Standing side step to lunge and return to middle  Standing anterior step with return to midline Patient requires min A performing 5 reps to each side in parallel bars for support  Physioball sitting with "w" exercise for posture, and seated reaching activities       PT Short Term Goals - 08/31/15 0921    PT SHORT TERM GOAL #1   Title The patient will be indep with HEP for R LE tightness, neck A/ROM, posture,  balance and general habituation.   Baseline Met on 08/31/2015   Time 4   Period Weeks   Status Achieved   PT SHORT TERM GOAL #2   Title The patient will improve Berg score from 21/56 to > or equal to 29/56 to demo improving steady state balance.   Baseline Patient scored 42/56 on Berg on n10/13/2016   Time 4   Period Weeks   Status Achieved   PT SHORT TERM GOAL #3   Title The patient will improve gait speed from 0.98 ft/sec to > or equal to 1.31 ft/sec to demo transition to "limited community ambulator" classification of gait.   Baseline Met on 08/25/2015 scoring 1.63 ft/sec with SPC   Time 4   Period Weeks   Status Achieved   PT SHORT TERM GOAL #4   Title The patient will move sit<>supine independently in < or equal to 8 seconds (patient requires min A and prolonged time to complete at eval).   Baseline Not met.  The patient required 11 sec to move sit>supine with back pain and 8.89 seconds to move supine >sit.   Time 4   Period Weeks   Status Not Met   PT SHORT TERM GOAL #5   Title The patient will ambulate with SPC modified indep x 300 ft to demo improved household/short community ambulation.   Baseline Met on 08/25/2015   Time 4   Period Weeks   Status Achieved           PT Long Term Goals - 08/25/15 1400    PT LONG TERM GOAL #1   Title The patient will return demo progression of HEP for post d/c.   Baseline Target date 09/25/2015   Time 8   Period Weeks   PT LONG TERM GOAL #2   Title The patient will improve Berg from 21/56 to > or equal to 34/56 to demo improving steady state balance for functional mobility.   Baseline Target date 09/25/2015   Time 8   Period Weeks   PT LONG TERM GOAL #3   Title The patient will improve gait speed from 0.98 ft/sec to > or equal to 1.6 ft/sec for improved mobility.   Baseline Target date 09/25/2015   Time 8   Period Weeks   PT LONG TERM GOAL #4   Title The patient will reduce baseline dizziness from 8/10 to < or equal to 4/10  for improved tolerance to movement.   Baseline Target date 09/25/2015   Time 8   Period Weeks   PT LONG TERM GOAL #5   Title The patient will improve functional status score from 40% to > or equal to 52% to demo improved self perception of mobility.   Baseline Target date 09/25/2015   Time 8  Period Weeks           Plan - 09/25/15 2128    Clinical Impression Statement The patient initially demonstrated significant progress with PT meeting all STGs in first 4 weeks of PT.  She has sustained 2 falls and had the unexpected loss of her nephew and has had a decline in her functional status.  PT to check LTGs and renew.  Today's session focused on power moves for trunk rotation, coordination and general mobility.    Pt will benefit from skilled therapeutic intervention in order to improve on the following deficits Abnormal gait;Decreased balance;Decreased mobility;Decreased strength;Dizziness;Postural dysfunction;Difficulty walking;Decreased endurance;Decreased activity tolerance;Pain;Impaired flexibility   Rehab Potential Good   PT Frequency 2x / week   PT Duration 8 weeks   PT Treatment/Interventions Balance training;Neuromuscular re-education;Manual techniques;Therapeutic activities;Vestibular;Therapeutic exercise;Patient/family education;Gait training;Stair training;Functional mobility training;ADLs/Self Care Home Management   PT Next Visit Plan Check status on LTGs (had met Berg and gait speed at 4 weeks, however recent decline--please establish current baseline after decline in status); please send to primary PT for renewal/recert.   Consulted and Agree with Plan of Care Patient        Problem List Patient Active Problem List   Diagnosis Date Noted  . Dizziness and giddiness 11/24/2013  . Diabetes type 2, uncontrolled ([place]) 11/24/2013  . Severe obesity (BMI >= 40) ([place]) 11/24/2013  . Lumbar radiculopathy 11/24/2013    Shirly Bartosiewicz, PT 09/23/2015, 1:22 PM  [facility] [address] [place], [place], [address] Phone: [phone]   Fax:  [phone]  Name: [name] [name] MRN: [id number] Date of Birth: [birth year]

## 2015-09-26 ENCOUNTER — Other Ambulatory Visit: Payer: Self-pay | Admitting: Urology

## 2015-09-26 ENCOUNTER — Ambulatory Visit: Payer: BLUE CROSS/BLUE SHIELD | Admitting: Physical Therapy

## 2015-09-26 DIAGNOSIS — N281 Cyst of kidney, acquired: Secondary | ICD-10-CM

## 2015-09-27 ENCOUNTER — Encounter: Payer: Self-pay | Admitting: Physical Therapy

## 2015-09-27 ENCOUNTER — Ambulatory Visit: Payer: BLUE CROSS/BLUE SHIELD | Admitting: Physical Therapy

## 2015-09-27 DIAGNOSIS — R269 Unspecified abnormalities of gait and mobility: Secondary | ICD-10-CM

## 2015-09-27 DIAGNOSIS — R531 Weakness: Secondary | ICD-10-CM

## 2015-09-27 DIAGNOSIS — R42 Dizziness and giddiness: Secondary | ICD-10-CM

## 2015-09-27 NOTE — Therapy (Signed)
[facility] [address] [place], [place], [address] Phone: [phone]   Fax:  [phone]  Physical Therapy Treatment  Patient Details  Name: [name] [name] MRN: [id number] Date of Birth: 1951-11-11 No Data Recorded  Encounter Date: 09/27/2015      PT End of Session - 09/27/15 1245    Visit Number 12   Number of Visits 16   Date for PT Re-Evaluation 09/25/15   Authorization Type private insurance   PT Start Time 1100   PT Stop Time 1144   PT Time Calculation (min) 44 min   Equipment Utilized During Treatment Gait belt   Activity Tolerance Patient tolerated treatment well   Behavior During Therapy [facility] for tasks assessed/performed      Past Medical History  Diagnosis Date  . HTN (hypertension)   . Depression   . Hyperlipidemia   . Polyneuropathy, diabetic ([place])     WALKS W/ CANE FOR BALANCE  . Type 2 diabetes mellitus ([place])   . Right ureteral stone   . Chronic lumbar radiculopathy     L5- S1  right leg  . 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 Arthritis     fingers,right shoulder  . History of kidney stones 05/12/15    surgery   . Frequency of urination   . SUI (stress urinary incontinence, female)   . Wears glasses   . OSA (obstructive sleep apnea)     moderate OSA per study 11-22-2007--  refused CPAP but used oxygen for 6 months at night,  states due to wt loss stopped using oxygen    Past Surgical History  Procedure Laterality Date  . Tubal ligation  1985  . Extracorporeal shock wave lithotripsy Right 08-18-2012  . Cardiovascular stress test  09-30-2007      normal Adenosine study/  no ischemia /  normal LV function and wall motion , ef 82%  . Transthoracic echocardiogram  09-23-2007    pseudonormal LV filling pattern,  ef 65-70%/  trivial AR/  mild LAE  . Cystoscopy with retrograde pyelogram, ureteroscopy and stent placement Right 05/12/2015    Procedure:  CYSTOSCOPY WITH RETROGRADE PYELOGRAM, URETEROSCOPY,STONE EXTRACTION AND STENT PLACEMENT;  Surgeon: Franchot Gallo, MD;  Location[facility];  Service: Urology;  Laterality: Right;  . Holmium laser application Right 3/79/0240    Procedure: HOLMIUM LASER APPLICATION;  Surgeon: Franchot Gallo, MD;  Location: [facility];  Service: Urology;  Laterality: Right;    There were no vitals filed for this visit.  Visit Diagnosis:  Abnormality of gait  Generalized weakness  Dizziness and giddiness      Subjective Assessment - 09/27/15 1223    Subjective pt. reports she feels pain in both knees at 6/10.  No falls reported.     Patient Stated Goals Be able to walk again.   Pain Score 6    Pain Location Knee   Pain Orientation Left;Right   Pain Descriptors / Indicators Aching   Pain Type Chronic pain   Pain Onset More than a month ago   Pain Frequency Intermittent   Aggravating Factors  stretching   Pain Relieving Factors nothing noted.   Multiple Pain Sites No     Neuro re-ed (to increase neuromuscular facilitation, strength, and balance):  - alternating multi-level toe-tapping on stairs with straight and crossover pattern 2 x 10 each leg; 1 UE support used; pt. required verbal  cues for proper technique.   In parallel bars: - balance board forward/backwards, side to side x 30 sec each way; eyes open, eyes closed; 1 UE support, no UE support.            [facility] Adult PT Treatment/Exercise - 09/27/15 1227    Transfers   Sit to Stand 5: Supervision   Sit to Stand Details Tactile cues for posture   Sit to Stand Details (indicate cue type and reason) pt. demo's proper technuque but requires increased time with sit>stand with straight cane.    Ambulation/Gait   Ambulation/Gait Yes   Ambulation/Gait Assistance 5: Supervision   Ambulation/Gait Assistance Details total ambulation distance 135f with straight cane; supervision provided for posture awareness.   Additional supervision provided with verbal cues to correct gait abnormalities listed bellow.     Ambulation Distance (Feet) 115 Feet   Assistive device Straight cane   Gait Pattern Decreased arm swing - left;Decreased arm swing - right;Decreased stride length   Ambulation Surface Level;Indoor   Gait velocity 1.60 ft/sec   Ramp Not tested (comment)   Curb Not tested (comment)   Berg Balance Test   Sit to Stand Able to stand without using hands and stabilize independently   Standing Unsupported Able to stand safely 2 minutes   Sitting with Back Unsupported but Feet Supported on Floor or Stool Able to sit safely and securely 2 minutes   Stand to Sit Sits safely with minimal use of hands   Transfers Able to transfer safely, minor use of hands   Standing Unsupported with Eyes Closed Able to stand 10 seconds with supervision   Standing Ubsupported with Feet Together Able to place feet together independently and stand for 1 minute with supervision   From Standing, Reach Forward with Outstretched Arm Can reach forward >12 cm safely (5")   From Standing Position, Pick up Object from Floor Able to pick up shoe, needs supervision   From Standing Position, Turn to Look Behind Over each Shoulder Turn sideways only but maintains balance   Turn 360 Degrees Able to turn 360 degrees safely but slowly   Standing Unsupported, Alternately Place Feet on Step/Stool Able to stand independently and safely and complete 8 steps in 20 seconds   Standing Unsupported, One Foot in Front Able to plae foot ahead of the other independently and hold 30 seconds   Standing on One Leg Unable to try or needs assist to prevent fall   Total Score 43             PT Short Term Goals - 08/31/15 01157   PT SHORT TERM GOAL #1   Title The patient will be indep with HEP for R LE tightness, neck A/ROM, posture, balance and general habituation.   Baseline Met on 08/31/2015   Time 4   Period Weeks   Status Achieved   PT  SHORT TERM GOAL #2   Title The patient will improve Berg score from 21/56 to > or equal to 29/56 to demo improving steady state balance.   Baseline Patient scored 42/56 on Berg on n10/13/2016   Time 4   Period Weeks   Status Achieved   PT SHORT TERM GOAL #3   Title The patient will improve gait speed from 0.98 ft/sec to > or equal to 1.31 ft/sec to demo transition to "limited community ambulator" classification of gait.   Baseline Met on 08/25/2015 scoring 1.63 ft/sec with SPC   Time 4   Period Weeks  Status Achieved   PT SHORT TERM GOAL #4   Title The patient will move sit<>supine independently in < or equal to 8 seconds (patient requires min A and prolonged time to complete at eval).   Baseline Not met.  The patient required 11 sec to move sit>supine with back pain and 8.89 seconds to move supine >sit.   Time 4   Period Weeks   Status Not Met   PT SHORT TERM GOAL #5   Title The patient will ambulate with SPC modified indep x 300 ft to demo improved household/short community ambulation.   Baseline Met on 08/25/2015   Time 4   Period Weeks   Status Achieved           PT Long Term Goals - 08/25/15 1400    PT LONG TERM GOAL #1   Title The patient will return demo progression of HEP for post d/c.   Baseline Target date 09/25/2015   Time 8   Period Weeks   PT LONG TERM GOAL #2   Title The patient will improve Berg from 21/56 to > or equal to 34/56 to demo improving steady state balance for functional mobility.   Baseline Target date 09/25/2015   Time 8   Period Weeks   PT LONG TERM GOAL #3   Title The patient will improve gait speed from 0.98 ft/sec to > or equal to 1.6 ft/sec for improved mobility.   Baseline Target date 09/25/2015   Time 8   Period Weeks   PT LONG TERM GOAL #4   Title The patient will reduce baseline dizziness from 8/10 to < or equal to 4/10 for improved tolerance to movement.   Baseline Target date 09/25/2015   Time 8   Period Weeks   PT LONG TERM  GOAL #5   Title The patient will improve functional status score from 40% to > or equal to 52% to demo improved self perception of mobility.   Baseline Target date 09/25/2015   Time 8   Period Weeks               Plan - 09/27/15 1246    Clinical Impression Statement Pt. continues progressing toward goals with a Berg Balance score of 43/56.  Pt. increased gait speed from 1.03 ft/sec to 1.60 ft/sec indicating decreased fall risk.  Pt. tolerated all balance and strengthening activities well showing only occasional episodes of minor dizziness repeat.     Pt will benefit from skilled therapeutic intervention in order to improve on the following deficits Abnormal gait;Decreased balance;Decreased mobility;Decreased strength;Dizziness;Postural dysfunction;Difficulty walking;Decreased endurance;Decreased activity tolerance;Pain;Impaired flexibility   Rehab Potential Good   PT Frequency 2x / week   PT Duration 8 weeks   PT Treatment/Interventions Balance training;Neuromuscular re-education;Manual techniques;Therapeutic activities;Vestibular;Therapeutic exercise;Patient/family education;Gait training;Stair training;Functional mobility training;ADLs/Self Care Home Management   PT Next Visit Plan Continue with balance.     Consulted and Agree with Plan of Care Patient        Problem List Patient Active Problem List   Diagnosis Date Noted  . Dizziness and giddiness 11/24/2013  . Diabetes type 2, uncontrolled ([place]) 11/24/2013  . Severe obesity (BMI >= 40) ([place]) 11/24/2013  . Lumbar radiculopathy 11/24/2013    Bess Harvest 09/27/2015, 12:59 PM  Bess Harvest, [place]  Name: [name] [name] MRN: [id number] Date of Birth: Oct 16, 1951  This note has been reviewed and edited by supervising CI.  Willow Ora, PTA, [place] [address], [place],  [place] [address] [phone] 09/28/2015, 11:01 AM

## 2015-09-27 NOTE — Patient Instructions (Signed)
Pt. Instructed on the importance of posture awareness during functional activity.

## 2015-09-30 ENCOUNTER — Ambulatory Visit: Payer: BLUE CROSS/BLUE SHIELD | Admitting: Rehabilitative and Restorative Service Providers"

## 2015-09-30 DIAGNOSIS — R531 Weakness: Secondary | ICD-10-CM

## 2015-09-30 DIAGNOSIS — R269 Unspecified abnormalities of gait and mobility: Secondary | ICD-10-CM

## 2015-09-30 NOTE — Therapy (Signed)
[facility] [address] [place], [place], [address] Phone: [phone]   Fax:  [phone]  Physical Therapy Treatment  Patient Details  Name: [name] [name] MRN: [id number] Date of Birth: [birth year] No Data Recorded  Encounter Date: 09/30/2015      PT End of Session - 09/30/15 0854    Visit Number 13   Number of Visits 16   Date for PT Re-Evaluation 09/25/15   Authorization Type private insurance   PT Start Time 0845   PT Stop Time 0930   PT Time Calculation (min) 45 min   Equipment Utilized During Treatment Gait belt   Activity Tolerance Patient tolerated treatment well   Behavior During Therapy [facility] for tasks assessed/performed      Past Medical History  Diagnosis Date  . HTN (hypertension)   . Depression   . Hyperlipidemia   . Polyneuropathy, diabetic ([place])     WALKS W/ CANE FOR BALANCE  . Type 2 diabetes mellitus ([place])   . Right ureteral stone   . Chronic lumbar radiculopathy     L5- S1  right leg  . 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 Arthritis     fingers,right shoulder  . History of kidney stones 05/12/15    surgery   . Frequency of urination   . SUI (stress urinary incontinence, female)   . Wears glasses   . OSA (obstructive sleep apnea)     moderate OSA per study 11-22-2007--  refused CPAP but used oxygen for 6 months at night,  states due to wt loss stopped using oxygen    Past Surgical History  Procedure Laterality Date  . Tubal ligation  1985  . Extracorporeal shock wave lithotripsy Right 08-18-2012  . Cardiovascular stress test  09-30-2007      normal Adenosine study/  no ischemia /  normal LV function and wall motion , ef 82%  . Transthoracic echocardiogram  09-23-2007    pseudonormal LV filling pattern,  ef 65-70%/  trivial AR/  mild LAE  . Cystoscopy with retrograde pyelogram, ureteroscopy and stent placement Right 05/12/2015    Procedure:  CYSTOSCOPY WITH RETROGRADE PYELOGRAM, URETEROSCOPY,STONE EXTRACTION AND STENT PLACEMENT;  Surgeon: Franchot Gallo, MD;  Location: [facility];  Service: Urology;  Laterality: Right;  . Holmium laser application Right 02/12/4741    Procedure: HOLMIUM LASER APPLICATION;  Surgeon: Franchot Gallo, MD;  Location: [facility];  Service: Urology;  Laterality: Right;    There were no vitals filed for this visit.  Visit Diagnosis:  Abnormality of gait  Generalized weakness      Subjective Assessment - 09/30/15 0852    Subjective The patient reports that she had a fall on Wednesday while standing up at home with her cane.  She reports no dizziness, had been out of the house for the day and came back home and then fell.     Patient Stated Goals Be able to walk again.   Currently in Pain? Yes   Pain Score 7    Pain Location Knee   Pain Orientation Right   Pain Descriptors / Indicators Aching   Pain Type Acute pain   Pain Onset In the past 7 days   Pain Frequency Intermittent   Aggravating Factors  a fall this week   Pain Relieving Factors unsure     Patient clarifies that sensation of "dizziness" is not vertigo, but  a 5/10 pulling sensation to the right side.  It only occurs to the right side, even when sitting and worse when standing. Turns aggravate sensation of "pulling" to the right side.     Gait: Ambulation with RW x 235 feet nonstop with cues on longer stride length and discussion/demo of correct use of RW (stay level with back legs, don't lean forward and use UEs to push up from seated support surface)    SELF CARE/HOME MANAGEMENT: Matter of balance class resources provided Safe and fall prevention discussed Discussed continued HEP after discharge and how to perform safely.      PT Short Term Goals - 08/31/15 3818    PT SHORT TERM GOAL #1   Title The patient will be indep with HEP for R LE tightness, neck A/ROM, posture, balance and general  habituation.   Baseline Met on 08/31/2015   Time 4   Period Weeks   Status Achieved   PT SHORT TERM GOAL #2   Title The patient will improve Berg score from 21/56 to > or equal to 29/56 to demo improving steady state balance.   Baseline Patient scored 42/56 on Berg on n10/13/2016   Time 4   Period Weeks   Status Achieved   PT SHORT TERM GOAL #3   Title The patient will improve gait speed from 0.98 ft/sec to > or equal to 1.31 ft/sec to demo transition to "limited community ambulator" classification of gait.   Baseline Met on 08/25/2015 scoring 1.63 ft/sec with SPC   Time 4   Period Weeks   Status Achieved   PT SHORT TERM GOAL #4   Title The patient will move sit<>supine independently in < or equal to 8 seconds (patient requires min A and prolonged time to complete at eval).   Baseline Not met.  The patient required 11 sec to move sit>supine with back pain and 8.89 seconds to move supine >sit.   Time 4   Period Weeks   Status Not Met   PT SHORT TERM GOAL #5   Title The patient will ambulate with SPC modified indep x 300 ft to demo improved household/short community ambulation.   Baseline Met on 08/25/2015   Time 4   Period Weeks   Status Achieved           PT Long Term Goals - 09/30/15 2993    PT LONG TERM GOAL #1   Title The patient will return demo progression of HEP for post d/c.   Baseline Patient    Time 8   Period Weeks   Status Achieved   PT LONG TERM GOAL #2   Title The patient will improve Berg from 21/56 to > or equal to 34/56 to demo improving steady state balance for functional mobility.   Baseline Berg=43/56 on 09/27/2015   Time 8   Period Weeks   Status Achieved   PT LONG TERM GOAL #3   Title The patient will improve gait speed from 0.98 ft/sec to > or equal to 1.6 ft/sec for improved mobility.   Baseline Met- gait speed=1.60 ft/sec   Time 8   Period Weeks   Status Achieved   PT LONG TERM GOAL #4   Title The patient will reduce baseline dizziness  from 8/10 to < or equal to 4/10 for improved tolerance to movement.   Baseline Sensation is described now as "pulling to the right" vs dizziness or vertigo.   Time 8   Period Weeks   Status  Deferred   PT LONG TERM GOAL #5   Title The patient will improve functional status score from 40% to > or equal to 52% to demo improved self perception of mobility.   Baseline Patient with recent decline in status.   Time 8   Period Weeks   Status Deferred         Plan - 10/03/15 8144    Clinical Impression Statement The patient has HEP for post d/c.  PT has reviewed it to ensure that she can perform safely.  We discussed ambulation using rollater RW due to continued falls.  The patient has shown objective improvements in gait speed and balance, however she continues with falls and subjective reports of fear of falling.  PT provided community resource information for matter of balance to address fear of falling.   PT Next Visit Plan Discharge today   Consulted and Agree with Plan of Care Patient        Problem List Patient Active Problem List   Diagnosis Date Noted  . Dizziness and giddiness 11/24/2013  . Diabetes type 2, uncontrolled ([place]) 11/24/2013  . Severe obesity (BMI >= 40) ([place]) 11/24/2013  . Lumbar radiculopathy 11/24/2013    , , PT  09/30/2015, 8:58 AM  [facility] [address] [place], [place], [address] Phone: [phone]   Fax:  [phone]  Name: [name] [name] MRN: [id number] Date of Birth: [birth year]

## 2015-10-03 ENCOUNTER — Encounter: Payer: Self-pay | Admitting: Rehabilitative and Restorative Service Providers"

## 2015-10-03 DIAGNOSIS — R531 Weakness: Secondary | ICD-10-CM

## 2015-10-03 DIAGNOSIS — R269 Unspecified abnormalities of gait and mobility: Secondary | ICD-10-CM

## 2015-10-03 NOTE — Therapy (Signed)
[facility] [address] [place], [place], [address] Phone: [phone]   Fax:  [phone]  Patient Details  Name: [name] [name] MRN: [id number] Date of Birth: July 26, 1951 Referring Provider:  No ref. provider found  Encounter Date: 09/30/2015  PHYSICAL THERAPY DISCHARGE SUMMARY  Visits from Start of Care:  13 Current functional level related to goals / functional outcomes:     PT Short Term Goals - 08/31/15 6808    PT SHORT TERM GOAL #1   Title The patient will be indep with HEP for R LE tightness, neck A/ROM, posture, balance and general habituation.   Baseline Met on 08/31/2015   Time 4   Period Weeks   Status Achieved   PT SHORT TERM GOAL #2   Title The patient will improve Berg score from 21/56 to > or equal to 29/56 to demo improving steady state balance.   Baseline Patient scored 42/56 on Berg on n10/13/2016   Time 4   Period Weeks   Status Achieved   PT SHORT TERM GOAL #3   Title The patient will improve gait speed from 0.98 ft/sec to > or equal to 1.31 ft/sec to demo transition to "limited community ambulator" classification of gait.   Baseline Met on 08/25/2015 scoring 1.63 ft/sec with SPC   Time 4   Period Weeks   Status Achieved   PT SHORT TERM GOAL #4   Title The patient will move sit<>supine independently in < or equal to 8 seconds (patient requires min A and prolonged time to complete at eval).   Baseline Not met.  The patient required 11 sec to move sit>supine with back pain and 8.89 seconds to move supine >sit.   Time 4   Period Weeks   Status Not Met   PT SHORT TERM GOAL #5   Title The patient will ambulate with SPC modified indep x 300 ft to demo improved household/short community ambulation.   Baseline Met on 08/25/2015   Time 4   Period Weeks   Status Achieved         PT Long Term Goals - 09/30/15 8110    PT LONG TERM GOAL #1   Title The patient will return demo progression of  HEP for post d/c.   Baseline Patient    Time 8   Period Weeks   Status Achieved   PT LONG TERM GOAL #2   Title The patient will improve Berg from 21/56 to > or equal to 34/56 to demo improving steady state balance for functional mobility.   Baseline Berg=43/56 on 09/27/2015   Time 8   Period Weeks   Status Achieved   PT LONG TERM GOAL #3   Title The patient will improve gait speed from 0.98 ft/sec to > or equal to 1.6 ft/sec for improved mobility.   Baseline Met- gait speed=1.60 ft/sec   Time 8   Period Weeks   Status Achieved   PT LONG TERM GOAL #4   Title The patient will reduce baseline dizziness from 8/10 to < or equal to 4/10 for improved tolerance to movement.   Baseline Sensation is described now as "pulling to the right" vs dizziness or vertigo.   Time 8   Period Weeks   Status Deferred   PT LONG TERM GOAL #5   Title The patient will improve functional status score from 40% to > or equal to 52% to demo improved self perception of mobility.   Baseline Patient with  recent decline in status.   Time 8   Period Weeks   Status Deferred        Remaining deficits: Decreased coordination Tightness noted in trunk Abnormality of gait Falls H/o chronic pain   Education / Equipment: HEP, matter of balance class in community to address fear of falling, safe use of assistive device.  Plan: Patient agrees to discharge.  Patient goals were partially met. Patient is being discharged due to                                                     ?????The intervention of PT is not currently reducing falls.  Patient is fearful of falling and limited in her performance of home exercise at this time.         Thank you for the referral of this patient. Rudell Cobb, MPT  WEAVER,CHRISTINA 10/03/2015, 2:46 PM  [facility] [address] [place], [place], [address] Phone: [phone]   Fax:  [phone]

## 2015-10-03 NOTE — Therapy (Signed)
[facility] [address] [place], [place], [address] Phone: [phone]   Fax:  [phone]  Patient Details  Name: [name] [name] MRN: [id number] Date of Birth: 03/31/1951 Referring Provider:  No ref. provider found  Encounter Date: for encounter 09/27/2015  Patient has been seen for 12 visits in PT from 07/29/2015-09/27/2015  PT to continue working towards HEP independence for discharge from PT.  With mobility training, the patient continues to be at risk for falls, which may indicate other fall risk factors that need further assessment.        PT Short Term Goals - 08/31/15 0921    PT SHORT TERM GOAL #1   Title The patient will be indep with HEP for R LE tightness, neck A/ROM, posture, balance and general habituation.   Baseline Met on 08/31/2015   Time 4   Period Weeks   Status Achieved   PT SHORT TERM GOAL #2   Title The patient will improve Berg score from 21/56 to > or equal to 29/56 to demo improving steady state balance.   Baseline Patient scored 42/56 on Berg on n10/13/2016   Time 4   Period Weeks   Status Achieved   PT SHORT TERM GOAL #3   Title The patient will improve gait speed from 0.98 ft/sec to > or equal to 1.31 ft/sec to demo transition to "limited community ambulator" classification of gait.   Baseline Met on 08/25/2015 scoring 1.63 ft/sec with SPC   Time 4   Period Weeks   Status Achieved   PT SHORT TERM GOAL #4   Title The patient will move sit<>supine independently in < or equal to 8 seconds (patient requires min A and prolonged time to complete at eval).   Baseline Not met.  The patient required 11 sec to move sit>supine with back pain and 8.89 seconds to move supine >sit.   Time 4   Period Weeks   Status Not Met   PT SHORT TERM GOAL #5   Title The patient will ambulate with SPC modified indep x 300 ft to demo improved household/short community ambulation.   Baseline Met on 08/25/2015    Time 4   Period Weeks   Status Achieved         PT Long Term Goals - 09/27/15 0813    PT LONG TERM GOAL #1   Title The patient will return demo progression of HEP for post d/c.   Baseline limited due ot patient's fear of falling.  Target date:  10/12/2015   Time 8   Period Weeks   Status On-going   PT LONG TERM GOAL #2   Title The patient will improve Berg from 21/56 to > or equal to 34/56 to demo improving steady state balance for functional mobility.   Baseline Berg=43/56 on 09/27/2015   Time 8   Period Weeks   Status Achieved   PT LONG TERM GOAL #3   Title The patient will improve gait speed from 0.98 ft/sec to > or equal to 1.6 ft/sec for improved mobility.   Baseline Met- gait speed=1.60 ft/sec   Time 8   Period Weeks   Status Achieved   PT LONG TERM GOAL #4   Title The patient will reduce baseline dizziness from 8/10 to < or equal to 4/10 for improved tolerance to movement.   Baseline Sensation is described now as "pulling to the right" vs dizziness or vertigo.   Time 8   Period Weeks     Status Deferred   PT LONG TERM GOAL #5   Title The patient will improve functional status score from 40% to > or equal to 52% to demo improved self perception of mobility.   Baseline Patient with recent decline in status.   Time 8   Period Weeks   Status Deferred         Plan - 10/03/15 0815    Clinical Impression Statement The patient showed initial progress with physical therapy.  She had a decline in her status after 2 recent falls and then unexpected death of her nephew.  The patient has stopped doing HEP at this time.  PT to continue for 2-3 further visits in order to progress patient back to HEP to avoid sedentary mobility after discharge from therapy.  She is continuing to have falls and PT will reach out to MD for folding rolling walker as the patient is unable to lift her rollater RW into her car.   Pt will benefit from skilled therapeutic intervention in order to improve  on the following deficits Abnormal gait;Decreased balance;Decreased mobility;Decreased strength;Dizziness;Postural dysfunction;Difficulty walking;Decreased endurance;Decreased activity tolerance;Pain;Impaired flexibility   PT Frequency 1x / week   PT Duration 2 weeks   PT Treatment/Interventions Balance training;Neuromuscular re-education;Manual techniques;Therapeutic activities;Vestibular;Therapeutic exercise;Patient/family education;Gait training;Stair training;Functional mobility training;ADLs/Self Care Home Management   PT Next Visit Plan Continue with focus on progressing HEP for discharge.   Consulted and Agree with Plan of Care Patient     Thank you for the referral of this patient. Rudell Cobb, MPT  Esthela Brandner 10/03/2015, 8:18 AM  [facility] [address] [place], [place], [address] Phone: [phone]   Fax:  [phone]

## 2015-10-11 ENCOUNTER — Ambulatory Visit
Admission: RE | Admit: 2015-10-11 | Discharge: 2015-10-11 | Disposition: A | Discharge status: Home / Self Care | Payer: BLUE CROSS/BLUE SHIELD | Source: Ambulatory Visit | Attending: Urology | Admitting: Urology

## 2015-10-11 DIAGNOSIS — N281 Cyst of kidney, acquired: Secondary | ICD-10-CM

## 2015-10-11 MED ORDER — GADOBENATE DIMEGLUMINE 529 MG/ML IV SOLN
20.0000 mL | Freq: Once | INTRAVENOUS | Status: AC | PRN
  Administered 2015-10-11: 20 mL via INTRAVENOUS

## 2015-10-12 ENCOUNTER — Ambulatory Visit (INDEPENDENT_AMBULATORY_CARE_PROVIDER_SITE_OTHER): Payer: BLUE CROSS/BLUE SHIELD | Admitting: Neurology

## 2015-10-12 DIAGNOSIS — G4733 Obstructive sleep apnea (adult) (pediatric): Secondary | ICD-10-CM | POA: Diagnosis not present

## 2015-10-12 DIAGNOSIS — G479 Sleep disorder, unspecified: Secondary | ICD-10-CM

## 2015-10-13 ENCOUNTER — Telehealth: Payer: Self-pay | Admitting: Neurology

## 2015-10-13 DIAGNOSIS — G4733 Obstructive sleep apnea (adult) (pediatric): Secondary | ICD-10-CM

## 2015-10-13 NOTE — Telephone Encounter (Signed)
Patient is aware of results and recommendation. She is willing to proceed with treatment. I will refer to [facility]. She was able to make f/u appt. I will send her a letter reminding her to keep appt and stress importance of compliance.

## 2015-10-13 NOTE — Telephone Encounter (Signed)
Patient returned call

## 2015-10-13 NOTE — Sleep Study (Signed)
Please see the scanned sleep study interpretation located in the procedure tab within the chart review section.   

## 2015-10-13 NOTE — Telephone Encounter (Signed)
Diana:  Patient referred by Dr. Ernie Hew, seen by me on 09/20/15, split study on 10/12/15, Ins: [id number]. Please call and notify patient that the recent sleep study confirmed the diagnosis of severe OSA. She did well with CPAP during the study with significant improvement of the respiratory events. Therefore, I would like start the patient on CPAP therapy at home by prescribing a machine for home use. I placed the order in the chart. The patient will need a follow up appointment with me in 8 to 10 weeks post set up that has to be scheduled; please go ahead and schedule while you have the patient on the phone and make sure patient understands the importance of keeping this window for the FU appointment, as it is often an insurance requirement and failing to adhere to this may result in losing coverage for sleep apnea treatment.  Please re-enforce the importance of compliance with treatment and the need for [place] to monitor compliance data - again an insurance requirement and good feedback for the patient as far as how they are doing.  Also remind patient, that any upcoming CPAP machine or mask issues, should be first addressed with the DME company. Please ask if patient has a preference regarding DME company.  Please arrange for CPAP set up at home through a DME company of patient's choice - once you have spoken to the patient - and faxed/routed report to PCP and referring MD (if other than PCP), you can close this encounter, thanks,   Star Age, MD, PhD [facility] ([facility])

## 2015-10-13 NOTE — Telephone Encounter (Signed)
Left message for patient to call back for sleep study results.  

## 2015-10-18 NOTE — Telephone Encounter (Signed)
I spoke to patient and gave her the DME name and phone number. I advised her that it may be a couple of days before the contact her due to getting CPAP approved through insurance.

## 2015-10-18 NOTE — Telephone Encounter (Signed)
Patient called, requests to speak with nurse regarding CPAP set up, please call [phone], call this afternoon after 2pm, will be away at luncheon this morning.

## 2015-11-22 ENCOUNTER — Telehealth: Payer: Self-pay | Admitting: Diagnostic Neuroimaging

## 2015-11-22 NOTE — Telephone Encounter (Signed)
Spoke with patient who states when she fell in Dec she was at home (in hotel during one fall) , was using her walker. She stated "I just fell." She is requesting to be seen sooner; r/s FU for 11/25/15; she understands to arrive 15 min early. She verbalized understanding, appreciation.

## 2015-11-22 NOTE — Telephone Encounter (Signed)
Patient called to advise she has fallen 4 times in December, states she has appointment 12/20/15 and wonders if Dr. Stark Klein wants to see her sooner? Wants to know if Dr. Stark Klein still thinks it's diabetic neuropathy? States PCP took her off all diabetic medications last month, last A1c was 5.8, please call to advise.

## 2015-11-25 ENCOUNTER — Encounter: Payer: Self-pay | Admitting: Diagnostic Neuroimaging

## 2015-11-25 ENCOUNTER — Ambulatory Visit (INDEPENDENT_AMBULATORY_CARE_PROVIDER_SITE_OTHER): Payer: BLUE CROSS/BLUE SHIELD | Admitting: Diagnostic Neuroimaging

## 2015-11-25 VITALS — BP 125/76 | HR 110 | Ht 62.0 in | Wt 221.0 lb

## 2015-11-25 DIAGNOSIS — G8191 Hemiplegia, unspecified affecting right dominant side: Secondary | ICD-10-CM

## 2015-11-25 DIAGNOSIS — E1142 Type 2 diabetes mellitus with diabetic polyneuropathy: Secondary | ICD-10-CM | POA: Diagnosis not present

## 2015-11-25 DIAGNOSIS — R296 Repeated falls: Secondary | ICD-10-CM | POA: Diagnosis not present

## 2015-11-25 NOTE — Progress Notes (Signed)
PATIENT: [name] [name] DOB: 1951/09/27  REASON FOR VISIT: follow up HISTORY FROM: patient  Chief Complaint  Patient presents with  . Right hemiparesis    rm 6, "req earlier FU, falls x 4 in Dec, feel lopsided, off balance"  . Follow-up    3 month    HISTORY OF PRESENT ILLNESS:  UPDATE 11/25/15: Since last visit, has had 4 more falls. Still living alone. More tremor. Has home health aid 4 hours per week.   UPDATE 09/14/15: Since last visit, has had 2 more falls. More leaning to right side. Has noted some shaking tremor in hands (early AM) holding a cup. Some intermittent drooling, but improved. No smell or taste loss.   UPDATE 07/12/15: Since last visit, having more foot pain, right knee pain, left hand pain, neck and low back issues. Also with drooling from right mouth. More balance diff. More weakness on right side. Fell on 06/12/15.   UPDATE 05/17/15: Since last visit, now having intermittent left hand numbness (digits 4,5) for past 2 months. Lasts minutes at a time. No triggers. Had more falls (easter and last week). Now has home aid to help her.   UPDATE 01/04/15 (VRP): Since last visit patient has had 4 additional falls, on November 05, 2014, January 2, January 3, November 23 2014. Patient still living alone. She has had several events where she has fallen down and had to call her son to help her stand back up. Patient has looked into assisted living and retirement community's, but at this point they are unaffordable financially. Patient has a sister who lives in [place], [place], whom she may be able to move in with.   UPDATE 10/04/14 (VRP): Since last visit, has fallen down 6 times (2 at [facility], 4 at home). She has fallen tripping over locker room bench, getting on/off gym equipment, tripping at home. Still lives alone with her dog. Driving getting more difficult. Life at home more challenging.   UPDATE 02/26/14 (LL): [name] [name] returns for followup visit, last visit was 3 months  ago.  MRI cervical spine shows some degenerative changes and mild spinal stenosis but none to warrant surgery eval at this time. TSH was normal and B12 was normal and abnormal at 318. Last Hemoglobin A1c was 7.2.  She has been attending physical therapy for gait and balance training which he states has helped.  She is also joint [facility] and is attending water exercise classes.  She is able to walk without cane now.  PRIOR HPI (11/24/13, VRP): 65 year old left-handed female with hypertension, diabetes, hypercholesteremia, depression, here for evaluation of 2 problems: First dizziness for past 6 months and second low back pain radiating to right leg for past 10 years. Regarding dizziness patient reports 6 month history of intermittent balance and wavering sensation when she first stands up. If she bends over she feels off balance and tends to fall over. She denies any vertigo, double vision, nausea, vomiting, headache. Patient had some numbness and tingling in her feet and legs. She has some neck pain. Regarding low back pain, this is been going on for at least 10 years. She has pain in her lower back, hips, mainly radiating to the right leg. She saw [facility] several years ago and went through physical therapy which helped a little bit. She has some intermittent leg cramping, and uses Tylenol as needed for the pain. Patient took Aleve over-the-counter but this caused a rash/allergy. Patient also has significant stressors in  her life, with the death of her husband in 07/05/13 and death of her father in 05-Aug-2013.    REVIEW OF SYSTEMS: Full 14 system review of systems performed and notable only for: fatigue runny nose flushing eye itch light sens pain cramps depression memory loss dizziness weakness tremors cough abd pain.    ALLERGIES: Allergies  Allergen Reactions  . Aleve [Naproxen] Hives, Shortness Of Breath and Rash    Pt Avoids All Nsaids  . Zocor [Simvastatin] Other (See  Comments)    "weird feeling all over body"    HOME MEDICATIONS: Outpatient Prescriptions Prior to Visit  Medication Sig Dispense Refill  . acetaminophen (TYLENOL) 650 MG CR tablet Take 1,300 mg by mouth every 8 (eight) hours as needed for pain.     Marland Kitchen aspirin 81 MG tablet Take 81 mg by mouth daily.    Marland Kitchen azelastine (ASTELIN) 0.1 % nasal spray Place 1 spray into both nostrils 2 (two) times daily.   11  . Cholecalciferol (VITAMIN D PO) Take 5,000 Units by mouth daily.    . diphenhydramine-acetaminophen (TYLENOL PM) 25-500 MG TABS Take 1 tablet by mouth at bedtime as needed (pain).     Marland Kitchen escitalopram (LEXAPRO) 20 MG tablet Take 10 mg by mouth every morning.   0  . fenofibrate 160 MG tablet Take 160 mg by mouth every morning.     . fluticasone (FLONASE) 50 MCG/ACT nasal spray Place 2 sprays into both nostrils every morning.   11  . loratadine (CLARITIN) 10 MG tablet Take 10 mg by mouth every morning.    Marland Kitchen losartan (COZAAR) 100 MG tablet Take 50 mg by mouth every morning.    . Menthol (PERFORM PAIN RELIEVING) 3.1 % GEL Apply topically as needed (KNEE PAIN).    Marland Kitchen montelukast (SINGULAIR) 10 MG tablet Take 10 mg by mouth at bedtime.    Marland Kitchen nystatin cream (MYCOSTATIN) Apply 1 application topically 2 (two) times daily as needed for dry skin.    . progesterone (PROMETRIUM) 100 MG capsule Take 100 mg by mouth at bedtime.  2  . trolamine salicylate (ASPERCREME) 10 % cream Apply 1 application topically as needed for muscle pain.    . metFORMIN (GLUCOPHAGE) 1000 MG tablet Take 500 mg by mouth 2 (two) times daily with a meal.     . methocarbamol (ROBAXIN) 500 MG tablet Take 1 tablet (500 mg total) by mouth 2 (two) times daily. 20 tablet 0   No facility-administered medications prior to visit.     PHYSICAL EXAM  Filed Vitals:   11/25/15 1358  BP: 125/76  Pulse: 110  Height: 5\' 2"  (1.575 m)  Weight: 221 lb (100.245 kg)   Body mass index is 40.41 kg/(m^2).   GENERAL EXAM: Patient is in no  distress; well developed, nourished and groomed; neck is supple  CARDIOVASCULAR: Regular rate and rhythm, no murmurs, no carotid bruits  NEUROLOGIC: MENTAL STATUS: awake, alert, language fluent, comprehension intact, naming intact, fund of knowledge appropriate CRANIAL NERVE: pupils equal and reactive to light, visual fields full to confrontation, MILD BILATERAL PTOSIS, DECR BLINKING, extraocular muscles intact, no nystagmus, facial sensation symmetric; DECR RIGHT NL FOLD; hearing intact, palate elevates symmetrically, uvula midline, shoulder shrug symmetric, tongue midline. MILD MASKED FACIES. MOTOR: normal bulk and tone, FULL STRENGTH IN BUE EXCEPT DELTOIDS 4; BLE 4. SLOW TAPPING IN RUE AND RLE. NO COGWHEELING. SENSORY: normal and symmetric to light touch; DECR IN FEET COORDINATION: finger-nose-finger, fine finger movements normal REFLEXES: BUE TRACE, BLE  0; NO FRONTAL RELEASE SIGNS GAIT/STATION: narrow based gait; SHORT STEPS, MILD SHUFFLING, VERY UNSTEADY. USES CANE. DIFF STANDING UP; RIGHT SIDE SLOWER DURING WALKING   DIAGNOSTIC DATA (LABS, IMAGING, TESTING) - I reviewed patient records, labs, notes, testing and imaging myself where available.  Lab Results  Component Value Date   HGBA1C 8.0* 12/01/2013   Lab Results  Component Value Date   [id number] 12/01/2013   Lab Results  Component Value Date   TSH 2.360 12/01/2013    10/22/13 MRI BRAIN - Mild chronic microvascular ischemic change. No acute intracranial findings.   10/22/13 MRI LUMBAR - Potentially symptomatic neural encroachment at L5-S1 on the left, but there is no dominant right-sided abnormality.  12/09/13 MR Cervical Spine 1. At C5-6: disc bulging and uncovertebral joint hypertrophy with mild spinal stenosis and severe biforaminal foraminal stenosis  2. At C6-7: disc bulging with mild spinal stenosis and mild spinal stenosis and moderate biforaminal stenosis  3. At C4-5: disc bulging with mild spinal stenosis  and mild left foraminal stenosis  4. No cord signal abnormalities.  07/13/15 MRI BRAIN  - Normal MRI brain (without). No change from MRI on 10/22/13.    ASSESSMENT AND PLAN  65 y.o. year old female here with intermittent dizziness for past 6 months and low back pain for past 10 years. Also with intermittent mild left hand numbness since May 2016.  Also with new onset right sided weakness, slurred speech, trouble swallowing since end of July 2016. I thought it may represent new stroke, but MRI brain was normal. Now with right side bradykinesia, intermittent drooling, significant gait difficulty. In light of this progression and unremarkable imaging, may represent a neurodegenerative condition (parkinson's vs parkinson's plus syndrome).    RIGHT SIDE WEAKNESS, SLURRED SPEECH, DYSPHAGIA (since late July 2016): possible new stroke (left brain, subcortical) but MRI brain was normal. Now on aspirin 81mg  daily. Parkinsonism also a possibility.   LEFT HAND NUMBNESS: likely related to intermittent left ulnar neuropathy vs cervical radiculopathy. Advised to avoid compression and monitor. Patient does not want to pursue surgical mgmt or steroid/pain injections.  DIZZINESS/BALANCE ISSUES: Patient describes primary balance difficulty, likely related to diabetic neuropathy, cervical spinal stenosis, lumbar radiculopathy and deconditioning/obesity. Not surgical candidate for neck surg at this time.   LOW BACK PAIN: Likely related to patient's obesity, degenerative spine disease and lumbar radiculopathy. Patient not interested in additional pain medications or steroid injections.    PLAN:  - continue PT exercises - continue cane / rollator walker - recommended increased supervision / support - will refer for home palliative care consult for advanced care planning and support  Orders Placed This Encounter  Procedures  . Amb Referral to Palliative Care   Return in about 6 months (around 05/24/2016).      Penni Bombard, MD [id number], [id number] PM Certified in Neurology, Neurophysiology and Neuroimaging  [facility] [address], [place], [place] [address] [phone]

## 2015-12-14 ENCOUNTER — Ambulatory Visit: Payer: Self-pay | Admitting: Neurology

## 2015-12-14 ENCOUNTER — Encounter: Payer: Self-pay | Admitting: Nurse Practitioner

## 2015-12-14 ENCOUNTER — Ambulatory Visit (INDEPENDENT_AMBULATORY_CARE_PROVIDER_SITE_OTHER): Payer: BLUE CROSS/BLUE SHIELD | Admitting: Nurse Practitioner

## 2015-12-14 VITALS — BP 133/81 | HR 115 | Resp 18 | Ht 61.5 in | Wt 203.4 lb

## 2015-12-14 DIAGNOSIS — Z9989 Dependence on other enabling machines and devices: Principal | ICD-10-CM

## 2015-12-14 DIAGNOSIS — G4733 Obstructive sleep apnea (adult) (pediatric): Secondary | ICD-10-CM | POA: Diagnosis not present

## 2015-12-14 NOTE — Progress Notes (Addendum)
[facility]  PATIENT: [name] [name] DOB: 01-Aug-1951   REASON FOR VISIT: follow up for obstructive sleep apnea, obesity, polyneuropathy, excessive daytime sleepiness, gait abnormality,recurrent falls HISTORY FROM:patient    HISTORY OF PRESENT ILLNESS:Patient follows up after sleep study confirms OSA. She is here for compliance report review. When questioning her compliance report she reports frequent nocturia at night and does not always restart the CPAP after getting up. She does feel that her daytime sleepiness is better being on CPAP. I explained the importance of CPAP compliance and what the insurance requirements are in failing to adhere to this might result in her losing her coverage for her sleep apnea treatment.  HISTORY: SAUnderlying obstructive sleep apnea. The patient is unaccompanied today. As you know, Ms. Rostad is a 65 year old right-handed woman with an underlying medical history of hypertension, diabetes, hyperlipidemia, LBP, depression, cervical spinal stenosis, obesity, neuropathy, recurrent falls and gait disorder as well as dizziness, for which she has been seeing Dr. [name] in our office since 11/24/2013, who reports a prior diagnosis of OSA. She had a sleep study in 2008 or 9. She was offered CPAP therapy but did not pursue this for fear of being unable to tolerate it. She was placed on oxygen therapy for about 6 months and felt better but then stopped it. She has lost weight and this last year in the realm of 25 pounds. She has been enrolled in [employer]. She does not smoke, she drinks alcohol occasionally but does drink a lot of caffeine in the form of coffee, about 3 cups per day and to bottles of soda. She goes to bed between 8 or 9 and has the TV on which stays on all night. She lives alone and has a small dog. She has 1 grown son who is local. She is retired. She is widowed. She has no family history of obstructive sleep apnea. Rise time is  around 7 AM. Her Epworth sleepiness score is 11 out of 24 today, her fatigue score is 49 out of 63. She does not recall any family history of OSA. She would be willing to be tested for sleep apnea again and consider CPAP therapy if the need arises. She reports no family history of Parkinson's disease. She has difficulty with her right side as and feeling weaker on the right side. She is afraid of falls. She has fallen repeatedly. She is in outpatient physical therapy at the neuro rehabilitation unit. She has nocturia usually twice per night. She has morning headaches about 3-4 times a month. She denies restless leg symptoms or leg twitching in her sleep. Her husband had reported her snoring.   She reported 2 falls recently. I reviewed the office note. He voiced concern that she may have hemi-parkinsonism.   REVIEW OF SYSTEMS: Full 14 system review of systems performed and notable only for those listed, all others are neg:  Constitutional: Fatigue Cardiovascular: neg Ear/Nose/Throat: neg  Skin: neg Eyes: neg Respiratory: neg Gastroitestinal: Urinary frequency Hematology/Lymphatic: neg  Endocrine: neg Musculoskeletal: Joint pain walking difficulty Allergy/Immunology: neg Neurological: neg Psychiatric: Depression Sleep : Obstructive sleep apnea with CPAP   ALLERGIES: Allergies  Allergen Reactions  . Aleve [Naproxen] Hives, Shortness Of Breath and Rash    Pt Avoids All Nsaids  . Zocor [Simvastatin] Other (See Comments)    "weird feeling all over body"    HOME MEDICATIONS: Outpatient Prescriptions Prior to Visit  Medication Sig Dispense Refill  . acetaminophen (TYLENOL) 650 MG CR tablet  Take 1,300 mg by mouth every 8 (eight) hours as needed for pain.     Marland Kitchen aspirin 81 MG tablet Take 81 mg by mouth daily.    Marland Kitchen azelastine (ASTELIN) 0.1 % nasal spray Place 1 spray into both nostrils 2 (two) times daily.   11  . Cholecalciferol (VITAMIN D PO) Take 5,000 Units by mouth daily.    .  diphenhydramine-acetaminophen (TYLENOL PM) 25-500 MG TABS Take 1 tablet by mouth at bedtime as needed (pain).     Marland Kitchen escitalopram (LEXAPRO) 20 MG tablet Take 10 mg by mouth every morning.   0  . fenofibrate 160 MG tablet Take 160 mg by mouth every morning.     . fluticasone (FLONASE) 50 MCG/ACT nasal spray Place 2 sprays into both nostrils every morning.   11  . loratadine (CLARITIN) 10 MG tablet Take 10 mg by mouth every morning.    Marland Kitchen losartan (COZAAR) 100 MG tablet Take 50 mg by mouth every morning.    . Menthol (PERFORM PAIN RELIEVING) 3.1 % GEL Apply topically as needed (KNEE PAIN).    Marland Kitchen montelukast (SINGULAIR) 10 MG tablet Take 10 mg by mouth at bedtime.    Marland Kitchen nystatin cream (MYCOSTATIN) Apply 1 application topically 2 (two) times daily as needed for dry skin.    . progesterone (PROMETRIUM) 100 MG capsule Take 100 mg by mouth at bedtime.  2  . trolamine salicylate (ASPERCREME) 10 % cream Apply 1 application topically as needed for muscle pain.     No facility-administered medications prior to visit.    PAST MEDICAL HISTORY: Past Medical History  Diagnosis Date  . HTN (hypertension)   . Depression   . Hyperlipidemia   . Polyneuropathy, diabetic ([place])     WALKS W/ CANE FOR BALANCE  . Type 2 diabetes mellitus ([place])   . Right ureteral stone   . Chronic lumbar radiculopathy     L5- S1  right leg  . 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 Arthritis     fingers,right shoulder  . History of kidney stones 05/12/15    surgery   . Frequency of urination   . SUI (stress urinary incontinence, female)   . Wears glasses   . OSA (obstructive sleep apnea)     moderate OSA per study 11-22-2007--  refused CPAP but used oxygen for 6 months at night,  states due to wt loss stopped using oxygen    PAST SURGICAL HISTORY: Past Surgical History  Procedure Laterality Date  . Tubal ligation  1985  . Extracorporeal shock wave lithotripsy Right 08-18-2012  .  Cardiovascular stress test  09-30-2007      normal Adenosine study/  no ischemia /  normal LV function and wall motion , ef 82%  . Transthoracic echocardiogram  09-23-2007    pseudonormal LV filling pattern,  ef 65-70%/  trivial AR/  mild LAE  . Cystoscopy with retrograde pyelogram, ureteroscopy and stent placement Right 05/12/2015    Procedure: CYSTOSCOPY WITH RETROGRADE PYELOGRAM, URETEROSCOPY,STONE EXTRACTION AND STENT PLACEMENT;  Surgeon: Franchot Gallo, MD;  [facility];  Service: Urology;  Laterality: Right;  . Holmium laser application Right 9/70/2637    Procedure: HOLMIUM LASER APPLICATION;  Surgeon: Franchot Gallo, MD;  Location[facility];  Service: Urology;  Laterality: Right;    FAMILY HISTORY: Family History  Problem Relation Age of Onset  . Hypertension    . Stroke    .  Heart Problems Father   . Stroke Mother   . Breast cancer Sister     SOCIAL HISTORY: Social History   Social History  . Marital Status: Widowed    Spouse Name: N/A  . Number of Children: 1  . Years of Education: College   Occupational History  . office manager   .     Social History Main Topics  . Smoking status: Never Smoker   . Smokeless tobacco: Never Used  . Alcohol Use: 0.0 oz/week    0 Standard drinks or equivalent per week     Comment: rare  . Drug Use: No  . Sexual Activity: Not on file   Other Topics Concern  . Not on file   Social History Narrative   Patient lives at home with her dog name "Hot Rod".   Caffeine Use: 2-3 cups of coffee a day         PHYSICAL EXAM  Filed Vitals:   12/14/15 1513  BP: 133/81  Pulse: 115  Resp: 18  Height: 5' 1.5" (1.562 m)  Weight: 203 lb 6.4 oz (92.262 kg)   Body mass index is 37.81 kg/(m^2).  Generalized: Well developed, obese female in no acute distress mild masking of the face Head: normocephalic and atraumatic,. Oropharynx benign , mallopatti 2.  Neck: Supple, no carotid bruits ,  neck circumference 15.5 Cardiac: Regular rate rhythm, no murmur  Musculoskeletal: No deformity   Neurological examination   Mentation: Alert oriented to time, place, history taking. Attention span and concentration appropriate. Recent and remote memory intact.  Follows all commands speech and language fluent. ESS 12. FSS 40   Cranial nerve II-XII: Pupils were equal round reactive to light extraocular movements were full,mild decreased blink,  visual field were full on confrontational test. Facial sensation and strength were normal. hearing was intact to finger rubbing bilaterally. Uvula tongue midline. head turning and shoulder shrug were normal and symmetric.Tongue protrusion into cheek strength was normal. Motor: normal bulk and tone, full strength in the BUE, BLE, fine finger movements normal, no pronator drift. No focal weakness Coordination: finger-nose-finger, fine finger movements normal Reflexes: Bilateral upper extremities trace bilateral lower extremities 0 . Gait and Station: Narrow-based gait short steppage , unsteady using a cane  DIAGNOSTIC DATA (LABS, IMAGING, TESTING) - I reviewed patient records, labs, notes, testing and imaging myself where available.  Lab Results  Component Value Date   WBC * 09/22/2007    10.6 **Please note change in CBC/Diff reference range**   HGB 15.6* 05/12/2015   HCT 46.0 05/12/2015   MCV 88.6 09/22/2007   PLT 265 09/22/2007      Component Value Date/Time   NA 143 05/12/2015 1101   K 4.0 05/12/2015 1101   CL 94* 09/23/2007 0510   CO2 30 09/23/2007 0510   GLUCOSE 126* 05/12/2015 1101   BUN 21 09/23/2007 0510   CREATININE 0.88 09/23/2007 0510   CALCIUM 8.9 09/23/2007 0510   PROT 7.5 09/22/2007 1716   ALBUMIN 3.9 09/22/2007 1716   AST 26 09/22/2007 1716   ALT 30 09/22/2007 1716   ALKPHOS 89 09/22/2007 1716   BILITOT 0.9 09/22/2007 1716   GFRNONAA >60 09/23/2007 0510   GFRAA  09/23/2007 0510    >60        The eGFR has been  calculated using the MDRD equation. This calculation has not been validated in all clinical   ASSESSMENT AND PLAN  65 y.o. year old female  has a past medical history  of HTN (hypertension); Depression; Hyperlipidemia; Polyneuropathy, diabetic ([place]); Type 2 diabetes mellitus ([place]);  Chronic lumbar radiculopathy; Bulge of cervical disc without myelopathy; Arthritis; History of kidney stones (05/12/15); Frequency of urination; SUI (stress urinary incontinence, female);  and OSA (obstructive sleep apnea). here to follow-up with her compliance report for CPAP. Compliance greater than 4 hours 14 days. Compliance less than 4 hours 16 days average usage time 3 hours 59 minutes. Pressure 7 cm AHI of 4.4.  PLAN: Discussed with Dr. [name] I re-enforced the importance of compliance with treatment and the need for [place] to monitor compliance data - again an insurance requirement I also reminded her  that any upcoming CPAP machine or mask issues, should be first addressed with the DME company. She will follow-up with Dr. [name] in 2 months for repeat CPAP compliance. She was made aware that if she is not compliant with machine insurance may take it away Dennie Bible, [facility], [facility], APRN  [facility] [address], [place], [place] [address] [phone]  I reviewed the above note and documentation by the Nurse Practitioner and agree with the history, physical exam, assessment and plan as outlined above. I was immediately available for face-to-face consultation. Star Age, MD, PhD [facility])

## 2015-12-14 NOTE — Patient Instructions (Addendum)
Try to use CPAP as at least 6 to 8 hours nightly Follow-up with Dr. [name] 2 months for CPAP Compliance

## 2015-12-20 ENCOUNTER — Ambulatory Visit: Payer: BLUE CROSS/BLUE SHIELD | Admitting: Diagnostic Neuroimaging

## 2015-12-22 ENCOUNTER — Other Ambulatory Visit: Payer: Self-pay | Admitting: Family Medicine

## 2015-12-22 ENCOUNTER — Ambulatory Visit
Admission: RE | Admit: 2015-12-22 | Discharge: 2015-12-22 | Disposition: A | Discharge status: Home / Self Care | Payer: BLUE CROSS/BLUE SHIELD | Source: Ambulatory Visit | Attending: Family Medicine | Admitting: Family Medicine

## 2015-12-22 DIAGNOSIS — M1990 Unspecified osteoarthritis, unspecified site: Secondary | ICD-10-CM

## 2015-12-27 ENCOUNTER — Ambulatory Visit: Payer: BLUE CROSS/BLUE SHIELD | Admitting: Neurology

## 2015-12-29 ENCOUNTER — Ambulatory Visit (INDEPENDENT_AMBULATORY_CARE_PROVIDER_SITE_OTHER): Payer: BLUE CROSS/BLUE SHIELD | Admitting: Neurology

## 2015-12-29 ENCOUNTER — Other Ambulatory Visit: Payer: BLUE CROSS/BLUE SHIELD

## 2015-12-29 ENCOUNTER — Encounter: Payer: Self-pay | Admitting: Neurology

## 2015-12-29 VITALS — BP 144/80 | HR 88 | Ht 61.5 in | Wt 217.0 lb

## 2015-12-29 DIAGNOSIS — G238 Other specified degenerative diseases of basal ganglia: Secondary | ICD-10-CM

## 2015-12-29 DIAGNOSIS — G903 Multi-system degeneration of the autonomic nervous system: Secondary | ICD-10-CM

## 2015-12-29 LAB — VITAMIN B12: VITAMIN B 12: 313 pg/mL (ref 200–1100)

## 2015-12-29 LAB — FOLATE: Folate: 13.5 ng/mL (ref >5.4)

## 2015-12-29 MED ORDER — CARBIDOPA-LEVODOPA 25-100 MG PO TABS: 1.0000 | ORAL_TABLET | Freq: Three times a day (TID) | ORAL | Status: DC

## 2015-12-29 NOTE — Progress Notes (Signed)
[name] [name] was seen today in the movement disorders clinic for neurologic consultation at the request of [facility], MD.    The patient presents for a second opinion regarding falls and balance difficulties.  She has been seeing Dr. [name] since January, 2015 and most recently saw him on 11/25/2015 and saw his nurse practitioner on 12/14/2015.  I have taken a detailed review of the records from [facility] neurology.  Her initial complaint in January, 2015 was of dizziness and balance change.  By November, 2015, the patient's balance change and falls had become such a problem that Dr. [name] told the patient that he thought it was unsafe for her to live alone.  He noted that the patient was very short stepped in her gait and she was off balance and antalgic.  Over the course of time, she continued to have repetitive falls.  As of August, 2016 the patient began to complain of drooling.  In November, 2016, the patient began to complain of tremor when holding a cup, which she states has increased.   Specific Symptoms:  Tremor: Yes.  , L hand in the AM, only with use Voice: thinks that she has gotten louder with time Sleep: thinks that she has some trouble sleeping, that she developed after husband died 3 years ago  Vivid Dreams:  No.  Acting out dreams:  No. Wet Pillows: No. Postural symptoms:  Yes.    Falls?  Yes.   Bradykinesia symptoms: shuffling gait, difficulty getting out of a chair and difficulty regaining balance Loss of smell:  No. Loss of taste:  No. Urinary Incontinence:  Yes.   (urinary frequency, urgency; wears pad underneath) Difficulty Swallowing:  No. Handwriting, micrographia: Yes.   Trouble with ADL's:  No. (but does hold on in order to put on pants)  Trouble buttoning clothing: No. Depression:  No. Memory changes:  Yes.   (able to manage own pills; able to daytime drive; able to cook for self but mostly in microwave) Hallucinations:  No.  visual  distortions: No. N/V:  No. Lightheaded:  No.  Syncope: No. Diplopia:  Yes.   (goes away if closes an eye; not sure if horizontal or vertical) Dyskinesia:  No.  Neuroimaging has previously been performed.  It is available for my review today.  She had an MRI brain on 07/15/15 that was unremarkable   ALLERGIES:   Allergies  Allergen Reactions  . Aleve [Naproxen] Hives, Shortness Of Breath and Rash    Pt Avoids All Nsaids  . Zocor [Simvastatin] Other (See Comments)    "weird feeling all over body"    CURRENT MEDICATIONS:  Outpatient Encounter Prescriptions as of 12/29/2015  Medication Sig  . acetaminophen (TYLENOL) 650 MG CR tablet Take 1,300 mg by mouth every 8 (eight) hours as needed for pain.   Marland Kitchen aspirin 81 MG tablet Take 81 mg by mouth daily.  Marland Kitchen azelastine (ASTELIN) 0.1 % nasal spray Place 1 spray into both nostrils 2 (two) times daily.   . Cholecalciferol (VITAMIN D PO) Take 5,000 Units by mouth daily.  . diphenhydramine-acetaminophen (TYLENOL PM) 25-500 MG TABS Take 1 tablet by mouth at bedtime as needed (pain).   Marland Kitchen docusate sodium (COLACE) 100 MG capsule Take 100 mg by mouth daily as needed.   Marland Kitchen escitalopram (LEXAPRO) 20 MG tablet Take 10 mg by mouth every morning.   . fenofibrate 160 MG tablet Take 160 mg by mouth every morning.   . fluticasone (FLONASE) 50 MCG/ACT nasal spray  Place 2 sprays into both nostrils every morning.   . loratadine (CLARITIN) 10 MG tablet Take 10 mg by mouth every morning.  Marland Kitchen losartan (COZAAR) 100 MG tablet Take 50 mg by mouth every morning.  . Menthol (PERFORM PAIN RELIEVING) 3.1 % GEL Apply topically as needed (KNEE PAIN).  Marland Kitchen montelukast (SINGULAIR) 10 MG tablet Take 10 mg by mouth at bedtime.  Marland Kitchen nystatin cream (MYCOSTATIN) Apply 1 application topically 2 (two) times daily as needed for dry skin.  . progesterone (PROMETRIUM) 100 MG capsule Take 100 mg by mouth at bedtime.  Marland Kitchen Propylene Glycol (SYSTANE BALANCE OP) Apply to eye daily.  Marland Kitchen trolamine  salicylate (ASPERCREME) 10 % cream Apply 1 application topically as needed for muscle pain.   No facility-administered encounter medications on file as of 12/29/2015.    PAST MEDICAL HISTORY:   Past Medical History  Diagnosis Date  . HTN (hypertension)   . Depression   . Hyperlipidemia   . Polyneuropathy, diabetic ([place])     WALKS W/ CANE FOR BALANCE  . Type 2 diabetes mellitus ([place])   . Right ureteral stone   . Chronic lumbar radiculopathy     L5- S1  right leg  . 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 Arthritis     fingers,right shoulder  . History of kidney stones 05/12/15    surgery   . Frequency of urination   . SUI (stress urinary incontinence, female)   . Wears glasses   . OSA (obstructive sleep apnea)     moderate OSA per study 11-22-2007--  refused CPAP but used oxygen for 6 months at night,  states due to wt loss stopped using oxygen    PAST SURGICAL HISTORY:   Past Surgical History  Procedure Laterality Date  . Tubal ligation  1985  . Extracorporeal shock wave lithotripsy Right 08-18-2012  . Cardiovascular stress test  09-30-2007      normal Adenosine study/  no ischemia /  normal LV function and wall motion , ef 82%  . Transthoracic echocardiogram  09-23-2007    pseudonormal LV filling pattern,  ef 65-70%/  trivial AR/  mild LAE  . Cystoscopy with retrograde pyelogram, ureteroscopy and stent placement Right 05/12/2015    Procedure: CYSTOSCOPY WITH RETROGRADE PYELOGRAM, URETEROSCOPY,STONE EXTRACTION AND STENT PLACEMENT;  Surgeon: Franchot Gallo, MD;  Location: [facility];  Service: Urology;  Laterality: Right;  . Holmium laser application Right [id number]    Procedure: HOLMIUM LASER APPLICATION;  Surgeon: Franchot Gallo, MD;  Location: [facility];  Service: Urology;  Laterality: Right;    SOCIAL HISTORY:   Social History   Social History  . Marital Status: Widowed    Spouse Name: N/A  .  Number of Children: 1  . Years of Education: College   Occupational History  . office manager   .     Social History Main Topics  . Smoking status: Never Smoker   . Smokeless tobacco: Never Used  . Alcohol Use: 0.0 oz/week    0 Standard drinks or equivalent per week     Comment: once every 6 months  . Drug Use: No  . Sexual Activity: Not on file   Other Topics Concern  . Not on file   Social History Narrative   Patient lives at home with her dog name "Hot Rod".   Caffeine Use: 2-3 cups of coffee a day  FAMILY HISTORY:   Family Status  Relation Status Death Age  . Father Deceased 109    heart disease  . Mother Deceased 46    stroke  . Sister Alive     asthma  . Sister Deceased     breast cancer  . Sister Deceased     breast cancer    ROS:  A complete 10 system review of systems was obtained and was unremarkable apart from what is mentioned above.  PHYSICAL EXAMINATION:    VITALS:   Filed Vitals:   12/29/15 1244  BP: 144/80  Pulse: 88  Height: 5' 1.5" (1.562 m)  Weight: 217 lb (98.431 kg)   Pt placed into examining gown for complete examination.  GEN:  The patient appears stated age and is in NAD. HEENT:  Normocephalic, atraumatic.  The mucous membranes are moist. The superficial temporal arteries are without ropiness or tenderness.  No fasciculations in tongue. CV:  RRR Lungs:  CTAB Neck/HEME:  There are no carotid bruits bilaterally.  Neurological examination:  Orientation: The patient is alert and oriented x3. Fund of knowledge is appropriate.  Recent and remote memory are intact.  Attention and concentration are normal.    Able to name objects and repeat phrases. Cranial nerves: There is good facial symmetry with the exception of mild L ptosis. Pupils are equal round and reactive to light bilaterally. Fundoscopic exam reveals clear margins bilaterally. Extraocular muscles are intact.  She has difficulty with smooth pursuit.  She has square wave  jerks.   The visual fields are full to confrontational testing. The speech is fluent and clear.   Some trouble with the gutteral sounds.  Soft palate rises symmetrically and there is no tongue deviation. Hearing is intact to conversational tone. Sensation: Sensation is intact to light and pinprick throughout (facial, trunk, extremities). Vibration is intact at the bilateral big toe. There is no extinction with double simultaneous stimulation. There is no sensory dermatomal level identified. Motor: Strength is 5/5 in the bilateral upper and lower extremities.   Shoulder shrug is equal and symmetric.  There is no pronator drift.  Rare fasciculations in right gastroc but none elsewhere including more proximal mm and in arms, chest/back.   Deep tendon reflexes: Deep tendon reflexes are 2+/4 at the bilateral biceps, triceps, brachioradialis, patella and achilles. Plantar responses is upgoing on the right and downgoing on the left.  Movement examination: Tone: There is normal tone in the bilateral upper extremities.  The tone in the lower extremities is normal.  Abnormal movements: no tremor; no dyskinesia; there is abnormal posture of the head with right head tilt and the right ear approximates the right shoulder Coordination:  There is decremation with RAM's, seen with virtually all forms of RAMs on the right. Gait and Station: The patient has mild difficulty arising out of a deep-seated chair without the use of the hands (is able to do it on first attempt but she is slow). The patient's stride length is markedly decreased and she turns en bloc.  Pull test not attempted as she is clearly unbalanced.    ASSESSMENT/PLAN:  1.  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  Talked to her about dx,  pathophysiology, prognosis, difference between this and PD.  Her insurance will not pay for DaT scanning  -she  needs labs to r/o other disorders.  Will order Zn++, vit E, celiac panel, MG panel (diplopia, left ptosis), RPR, B12, folate.  Will hold on paraneoplastic panel due to cost and due to the fact that she has had sx's since 2015  -told her that we would try carbidopa/levodopa 25/100 and work to tid.  This likely won't be enough but is just starting dose to make sure that she tolerates the medication. 2.  F/u with myself or Dr. [name] in 1-2 months.  She wants to talk with PCP about who to f/u with, which is reasonable approach. 3.  Much greater than 50% of this visit was spent in counseling with the patient and the family.  Total face to face time:  80 min

## 2015-12-29 NOTE — Addendum Note (Signed)
Addended byAnnamaria Helling on: 12/29/2015 02:36 PM   Modules accepted: Orders

## 2015-12-29 NOTE — Patient Instructions (Addendum)
1. Your provider has requested that you have labwork completed today. Please go to [facility] ([address]) on the second floor of this building before leaving the office today. You do not need to check in. If you are not called within 15 minutes please check with the front desk.  2. Start Carbidopa Levodopa as follows: 1/2 tab three times a day before meals x 1 wk, then 1/2 in am & noon & 1 in evening for a week, then 1/2 in am &1 at noon &one in evening for a week, then 1 tablet three times a day before meals.

## 2015-12-30 LAB — GLIADIN ANTIBODIES, SERUM
Gliadin IgA: 5 Units (ref <20)
Gliadin IgG: 2 Units (ref <20)

## 2015-12-30 LAB — VDRL: VDRL, Serum: NONREACTIVE

## 2015-12-30 LAB — TISSUE TRANSGLUTAMINASE, IGA: Tissue Transglutaminase Ab, IgA: 1 U/mL (ref <4)

## 2016-01-01 LAB — RETICULIN ANTIBODIES, IGA W TITER: RETICULIN AB, IGA: NEGATIVE

## 2016-01-02 ENCOUNTER — Telehealth: Payer: Self-pay | Admitting: Neurology

## 2016-01-02 NOTE — Telephone Encounter (Signed)
PT called and said she could not remember a long name Dr Tat told her/Dawn CB# [phone]

## 2016-01-02 NOTE — Telephone Encounter (Signed)
Pt wants to know the results of the blood work please  Call her at [phone]

## 2016-01-02 NOTE — Telephone Encounter (Signed)
Patient aware.

## 2016-01-02 NOTE — Telephone Encounter (Signed)
Results not yet in chart.

## 2016-01-03 LAB — MYASTHENIA GRAVIS PANEL 2: ACETYLCHOLINE REC MOD AB: 19 %{inhibition}

## 2016-01-03 LAB — ZINC: ZINC: 66 ug/dL (ref 60–130)

## 2016-01-03 LAB — VITAMIN E
GAMMA-TOCOPHEROL (VIT E): 1.1 mg/L (ref <=4.3)
Vitamin E (Alpha Tocopherol): 11.5 mg/L (ref 5.7–19.9)

## 2016-01-04 NOTE — Telephone Encounter (Signed)
Patient made aware per Dr Tat- to take B12, 1026mcg daily. Aware other labs look okay. Patient wrote down her diagnosis- she wanted to know this information. She is not sure if Carbidopa Levodopa is helpful yet since she has not taken it long. She will call back if she chooses to follow up in our office.

## 2016-01-05 ENCOUNTER — Emergency Department (HOSPITAL_COMMUNITY): Payer: BLUE CROSS/BLUE SHIELD

## 2016-01-05 ENCOUNTER — Other Ambulatory Visit: Payer: Self-pay | Admitting: Family Medicine

## 2016-01-05 ENCOUNTER — Encounter (HOSPITAL_COMMUNITY): Payer: Self-pay | Admitting: *Deleted

## 2016-01-05 ENCOUNTER — Emergency Department (HOSPITAL_COMMUNITY)
Admission: EM | Admit: 2016-01-05 | Discharge: 2016-01-05 | Disposition: A | Discharge status: Home / Self Care | Payer: BLUE CROSS/BLUE SHIELD | Attending: Emergency Medicine | Admitting: Emergency Medicine

## 2016-01-05 ENCOUNTER — Telehealth: Payer: Self-pay | Admitting: Neurology

## 2016-01-05 DIAGNOSIS — I10 Essential (primary) hypertension: Secondary | ICD-10-CM | POA: Insufficient documentation

## 2016-01-05 DIAGNOSIS — M19011 Primary osteoarthritis, right shoulder: Secondary | ICD-10-CM | POA: Diagnosis not present

## 2016-01-05 DIAGNOSIS — Y9389 Activity, other specified: Secondary | ICD-10-CM | POA: Diagnosis not present

## 2016-01-05 DIAGNOSIS — W19XXXA Unspecified fall, initial encounter: Secondary | ICD-10-CM

## 2016-01-05 DIAGNOSIS — Z79899 Other long term (current) drug therapy: Secondary | ICD-10-CM | POA: Diagnosis not present

## 2016-01-05 DIAGNOSIS — Z87442 Personal history of urinary calculi: Secondary | ICD-10-CM | POA: Insufficient documentation

## 2016-01-05 DIAGNOSIS — Y998 Other external cause status: Secondary | ICD-10-CM | POA: Insufficient documentation

## 2016-01-05 DIAGNOSIS — Y9289 Other specified places as the place of occurrence of the external cause: Secondary | ICD-10-CM | POA: Diagnosis not present

## 2016-01-05 DIAGNOSIS — S29002A Unspecified injury of muscle and tendon of back wall of thorax, initial encounter: Secondary | ICD-10-CM | POA: Diagnosis not present

## 2016-01-05 DIAGNOSIS — R9389 Abnormal findings on diagnostic imaging of other specified body structures: Secondary | ICD-10-CM

## 2016-01-05 DIAGNOSIS — Z8669 Personal history of other diseases of the nervous system and sense organs: Secondary | ICD-10-CM | POA: Diagnosis not present

## 2016-01-05 DIAGNOSIS — F329 Major depressive disorder, single episode, unspecified: Secondary | ICD-10-CM | POA: Insufficient documentation

## 2016-01-05 DIAGNOSIS — T148 Other injury of unspecified body region: Secondary | ICD-10-CM | POA: Insufficient documentation

## 2016-01-05 DIAGNOSIS — Z8742 Personal history of other diseases of the female genital tract: Secondary | ICD-10-CM | POA: Insufficient documentation

## 2016-01-05 DIAGNOSIS — Z7982 Long term (current) use of aspirin: Secondary | ICD-10-CM | POA: Diagnosis not present

## 2016-01-05 DIAGNOSIS — E1140 Type 2 diabetes mellitus with diabetic neuropathy, unspecified: Secondary | ICD-10-CM | POA: Diagnosis not present

## 2016-01-05 DIAGNOSIS — W1839XA Other fall on same level, initial encounter: Secondary | ICD-10-CM | POA: Insufficient documentation

## 2016-01-05 DIAGNOSIS — Z7951 Long term (current) use of inhaled steroids: Secondary | ICD-10-CM | POA: Diagnosis not present

## 2016-01-05 DIAGNOSIS — S3992XA Unspecified injury of lower back, initial encounter: Secondary | ICD-10-CM | POA: Diagnosis present

## 2016-01-05 DIAGNOSIS — M542 Cervicalgia: Secondary | ICD-10-CM

## 2016-01-05 DIAGNOSIS — T148XXA Other injury of unspecified body region, initial encounter: Secondary | ICD-10-CM

## 2016-01-05 MED ORDER — HYDROCODONE-ACETAMINOPHEN 5-325 MG PO TABS: 1.0000 | ORAL_TABLET | Freq: Four times a day (QID) | ORAL | Status: DC | PRN

## 2016-01-05 MED ORDER — METHOCARBAMOL 500 MG PO TABS: 500.0000 mg | ORAL_TABLET | Freq: Two times a day (BID) | ORAL | Status: DC

## 2016-01-05 MED ORDER — DIAZEPAM 5 MG PO TABS
5.0000 mg | ORAL_TABLET | Freq: Once | ORAL | Status: AC
  Administered 2016-01-05: 5 mg via ORAL
  Filled 2016-01-05: qty 1

## 2016-01-05 MED ORDER — HYDROCODONE-ACETAMINOPHEN 5-325 MG PO TABS
1.0000 | ORAL_TABLET | Freq: Once | ORAL | Status: AC
  Administered 2016-01-05: 1 via ORAL
  Filled 2016-01-05: qty 1

## 2016-01-05 NOTE — ED Provider Notes (Signed)
CSN: [id number]     Arrival date & time 01/05/16  1639 History   First MD Initiated Contact with Patient 01/05/16 2045     Chief Complaint  Patient presents with  . Fall  . Back Pain     (Consider location/radiation/quality/duration/timing/severity/associated sxs/prior Treatment) HPI Comments: Pt comes in with cc of fall. Pt has hx of parkinsons and her balance has been getting worse. She had a fall on the asphalt today. Pt's fall was at 2 pm. She did strike her head, and has a posterior hematoma, but otherwise pt denies nausea, vomiting, visual complains, seizures, altered mental status, loss of consciousness, new weakness, or numbness, no gait instability. Pt is having back pain. She took 4 tylenols with no relief, so she came to the ER. She also has spasm in the back. Not on any anticoagulation.  Patient is a 65 y.o. female presenting with fall and back pain. The history is provided by the patient.  Fall Pertinent negatives include no chest pain, no abdominal pain, no headaches and no shortness of breath.  Back Pain Associated symptoms: no abdominal pain, no chest pain, no dysuria and no headaches     Past Medical History  Diagnosis Date  . HTN (hypertension)   . Depression   . Hyperlipidemia   . Polyneuropathy, diabetic ([place])     WALKS W/ CANE FOR BALANCE  . Type 2 diabetes mellitus ([place])   . Right ureteral stone   . Chronic lumbar radiculopathy     L5- S1  right leg  . 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 Arthritis     fingers,right shoulder  . History of kidney stones 05/12/15    surgery   . Frequency of urination   . SUI (stress urinary incontinence, female)   . Wears glasses   . OSA (obstructive sleep apnea)     moderate OSA per study 11-22-2007--  refused CPAP but used oxygen for 6 months at night,  states due to wt loss stopped using oxygen   Past Surgical History  Procedure Laterality Date  . Tubal ligation  1985  .  Extracorporeal shock wave lithotripsy Right 08-18-2012  . Cardiovascular stress test  09-30-2007      normal Adenosine study/  no ischemia /  normal LV function and wall motion , ef 82%  . Transthoracic echocardiogram  09-23-2007    pseudonormal LV filling pattern,  ef 65-70%/  trivial AR/  mild LAE  . Cystoscopy with retrograde pyelogram, ureteroscopy and stent placement Right 05/12/2015    Procedure: CYSTOSCOPY WITH RETROGRADE PYELOGRAM, URETEROSCOPY,STONE EXTRACTION AND STENT PLACEMENT;  Surgeon: Franchot Gallo, MD;  Location: [facility];  Service: Urology;  Laterality: Right;  . Holmium laser application Right 0000000    Procedure: HOLMIUM LASER APPLICATION;  Surgeon: Franchot Gallo, MD;  Location: [facility];  Service: Urology;  Laterality: Right;   Family History  Problem Relation Age of Onset  . Hypertension    . Stroke    . Heart Problems Father   . Stroke Mother   . Breast cancer Sister    Social History  Substance Use Topics  . Smoking status: Never Smoker   . Smokeless tobacco: Never Used  . Alcohol Use: 0.0 oz/week    0 Standard drinks or equivalent per week     Comment: once every 6 months   OB History    No data available  Review of Systems  Constitutional: Positive for activity change.  Respiratory: Negative for shortness of breath.   Cardiovascular: Negative for chest pain.  Gastrointestinal: Negative for nausea, vomiting and abdominal pain.  Genitourinary: Negative for dysuria.  Musculoskeletal: Positive for back pain and gait problem. Negative for neck pain.  Neurological: Negative for headaches.      Allergies  Aleve and Zocor  Home Medications   Prior to Admission medications   Medication Sig Start Date End Date Taking? Authorizing Provider  acetaminophen (TYLENOL) 650 MG CR tablet Take 650-1,300 mg by mouth every 8 (eight) hours as needed for pain.    Yes Historical Provider, MD  aspirin 81 MG tablet Take  81 mg by mouth daily.   Yes Historical Provider, MD  azelastine (ASTELIN) 0.1 % nasal spray Place 1 spray into both nostrils 2 (two) times daily.  12/23/14  Yes Historical Provider, MD  carbidopa-levodopa (SINEMET IR) 25-100 MG tablet Take 1 tablet by mouth 3 (three) times daily. Patient taking differently: Take 0.5 tablets by mouth 3 (three) times daily.  12/29/15  Yes Rebecca S Tat, DO  Cholecalciferol (VITAMIN D PO) Take 5,000 Units by mouth daily.   Yes Historical Provider, MD  diphenhydramine-acetaminophen (TYLENOL PM) 25-500 MG TABS Take 1 tablet by mouth at bedtime as needed (pain).    Yes Historical Provider, MD  escitalopram (LEXAPRO) 20 MG tablet Take 10 mg by mouth every morning.  10/28/14  Yes Historical Provider, MD  fenofibrate 160 MG tablet Take 160 mg by mouth every morning.    Yes Historical Provider, MD  fluticasone (FLONASE) 50 MCG/ACT nasal spray Place 2 sprays into both nostrils every morning.  12/23/14  Yes Historical Provider, MD  loratadine (CLARITIN) 10 MG tablet Take 10 mg by mouth every morning.   Yes Historical Provider, MD  losartan (COZAAR) 100 MG tablet Take 50 mg by mouth every morning.   Yes Historical Provider, MD  Menthol (PERFORM PAIN RELIEVING) 3.1 % GEL Apply topically as needed (KNEE PAIN).   Yes Historical Provider, MD  montelukast (SINGULAIR) 10 MG tablet Take 10 mg by mouth at bedtime.   Yes Historical Provider, MD  nystatin cream (MYCOSTATIN) Apply 1 application topically 2 (two) times daily as needed for dry skin.   Yes Historical Provider, MD  progesterone (PROMETRIUM) 100 MG capsule Take 100 mg by mouth at bedtime. 12/20/14  Yes Historical Provider, MD  Propylene Glycol (SYSTANE BALANCE OP) Place 2 drops into both eyes 2 (two) times daily as needed (dry eyes).    Yes Historical Provider, MD  docusate sodium (COLACE) 100 MG capsule Take 100 mg by mouth daily as needed for mild constipation.     Historical Provider, MD  HYDROcodone-acetaminophen (NORCO/VICODIN)  5-325 MG tablet Take 1 tablet by mouth every 6 (six) hours as needed. 01/05/16   Varney Biles, MD  methocarbamol (ROBAXIN) 500 MG tablet Take 1 tablet (500 mg total) by mouth 2 (two) times daily. 01/05/16   Varney Biles, MD  trolamine salicylate (ASPERCREME) 10 % cream Apply 1 application topically as needed for muscle pain.    Historical Provider, MD   BP 126/73 mmHg  Pulse 77  Temp(Src) 97.3 F (36.3 C) (Oral)  Resp 20  SpO2 96% Physical Exam  Constitutional: She is oriented to person, place, and time. She appears well-developed and well-nourished.  HENT:  Head: Normocephalic and atraumatic.  Eyes: EOM are normal. Pupils are equal, round, and reactive to light.  Neck: Neck supple.  No midline c-spine tenderness  Cardiovascular: Normal rate and regular rhythm.   No murmur heard. Pulmonary/Chest: Effort normal and breath sounds normal. No respiratory distress. She exhibits no tenderness.  Abdominal: Soft. Bowel sounds are normal. She exhibits no distension. There is no tenderness.  Musculoskeletal:  No long bone tenderness - upper and lower extrmeities and no pelvic pain, instability.  Pt has tenderness over the thoracic and lumbar region No step offs, no erythema. Able to discriminate between sharp and dull. Able to ambulate   Neurological: She is alert and oriented to person, place, and time. No cranial nerve deficit.  Skin: Skin is warm and dry. No rash noted.  Nursing note and vitals reviewed.   ED Course  Procedures (including critical care time) Labs Review Labs Reviewed - No data to display  Imaging Review Dg Thoracic Spine 2 View  01/05/2016  CLINICAL DATA:  Acute onset of mid to lower back pain, worse on the right. Status post fall. Initial encounter. EXAM: THORACIC SPINE 2 VIEWS COMPARISON:  Thoracic spine radiographs performed 09/01/2015 FINDINGS: There is no evidence of fracture or subluxation. Vertebral bodies demonstrate normal height and alignment. Multilevel  disc space narrowing is noted along the lower thoracic spine. Slight anterior wedging at the lower thoracic and upper lumbar spine appear to be chronic in nature, with mild associated degenerative change. The visualized portions of both lungs are clear. The mediastinum is unremarkable in appearance. IMPRESSION: 1. No evidence of fracture or subluxation along the thoracic spine. 2. Slight anterior wedging at the lower thoracic and lumbar spine appear to be chronic in nature, with mild associated degenerative change. Electronically Signed   By: Garald Balding M.D.   On: 01/05/2016 21:55   Dg Lumbar Spine Complete  01/05/2016  CLINICAL DATA:  Acute onset mid to lower back pain, status post fall, worse on the right. Initial encounter. EXAM: LUMBAR SPINE - COMPLETE 4+ VIEW COMPARISON:  Lumbar spine radiographs performed 12/22/2015 FINDINGS: There is no evidence of acute fracture or subluxation. There is suggestion of mild chronic loss of height at the superior endplate of L5, stable from the prior study. Anterior osteophytes are noted along the lower thoracic and upper lumbar spine. Mild underlying facet disease is noted at the lower lumbar spine. Intervertebral disc spaces are preserved. Minimal left convex lumbar scoliosis is again noted. The visualized bowel gas pattern is unremarkable in appearance; air and stool are noted within the colon. The sacroiliac joints are within normal limits. IMPRESSION: 1. No evidence of acute fracture or subluxation along the lumbar spine. 2. Suggestion of mild chronic loss of height at the superior endplate of L5, stable from the prior study. 3. Mild degenerative change along the lumbar spine. Minimal left convex lumbar scoliosis again noted. Electronically Signed   By: Garald Balding M.D.   On: 01/05/2016 21:57   I have personally reviewed and evaluated these images and lab results as part of my medical decision-making.   EKG Interpretation None      MDM   Final  diagnoses:  Fall, initial encounter  Contusion    Pt comes in post fall. Brain and cspine cleared clinically. Pt has focal lumbar and thoracic spine tenderness. We will get pain in control with oral meds. Stable for d/c.  Varney Biles, MD 01/05/16 [phone]

## 2016-01-05 NOTE — ED Notes (Addendum)
Per EMS, pt fell at ~2PM today. Pt denies loss of consciousness. Pt hit the back of her head, complains of mid-back pain. Pt has hx of parkinson's disease. Pt states she did not trip, pt states her legs have felt for the past couple of weeks. Pt states this is her 5th fall this month. Pt states her neurologist diagnosed her with msa-c recently.

## 2016-01-05 NOTE — ED Notes (Signed)
Patient transported to X-ray 

## 2016-01-05 NOTE — Telephone Encounter (Signed)
Pt is returning your call please call [phone]

## 2016-01-05 NOTE — Discharge Instructions (Signed)
We saw you in the ER after you had a fall. All the imaging results are normal, no fractures seen. No evidence of brain bleed. Please be very careful with walking, and do everything possible to prevent falls.  Contusion A contusion is a deep bruise. Contusions are the result of a blunt injury to tissues and muscle fibers under the skin. The injury causes bleeding under the skin. The skin overlying the contusion may turn blue, purple, or yellow. Minor injuries will give you a painless contusion, but more severe contusions may stay painful and swollen for a few weeks.  CAUSES  This condition is usually caused by a blow, trauma, or direct force to an area of the body. SYMPTOMS  Symptoms of this condition include:  Swelling of the injured area.  Pain and tenderness in the injured area.  Discoloration. The area may have redness and then turn blue, purple, or yellow. DIAGNOSIS  This condition is diagnosed based on a physical exam and medical history. An X-ray, CT scan, or MRI may be needed to determine if there are any associated injuries, such as broken bones (fractures). TREATMENT  Specific treatment for this condition depends on what area of the body was injured. In general, the best treatment for a contusion is resting, icing, applying pressure to (compression), and elevating the injured area. This is often called the RICE strategy. Over-the-counter anti-inflammatory medicines may also be recommended for pain control.  HOME CARE INSTRUCTIONS   Rest the injured area.  If directed, apply ice to the injured area:  Put ice in a plastic bag.  Place a towel between your skin and the bag.  Leave the ice on for 20 minutes, 2-3 times per day.  If directed, apply light compression to the injured area using an elastic bandage. Make sure the bandage is not wrapped too tightly. Remove and reapply the bandage as directed by your health care provider.  If possible, raise (elevate) the injured area  above the level of your heart while you are sitting or lying down.  Take over-the-counter and prescription medicines only as told by your health care provider. SEEK MEDICAL CARE IF:  Your symptoms do not improve after several days of treatment.  Your symptoms get worse.  You have difficulty moving the injured area. SEEK IMMEDIATE MEDICAL CARE IF:   You have severe pain.  You have numbness in a hand or foot.  Your hand or foot turns pale or cold.   This information is not intended to replace advice given to you by your health care provider. Make sure you discuss any questions you have with your health care provider.   Document Released: 08/08/2005 Document Revised: 07/20/2015 Document Reviewed: 03/16/2015 Elsevier Interactive Patient Education [employer].

## 2016-01-05 NOTE — Telephone Encounter (Signed)
Did not call patient - but called her back she wanted to let us know that she had a fall today and has a "goose-egg" on her head. She has ice on it now. She did not lose consciousness. No other symptoms other than back pain from the fall.  Please advise.   She also asked about her condition- if attending a cruise in July would be reasonable and I let her know there is no reason from our medical standpoint that she couldn't do that.

## 2016-01-05 NOTE — ED Notes (Signed)
Patient complaining of pain to the back left part of her head, left leg, and lower back. Patient denies any bowel or bladder problems.

## 2016-01-05 NOTE — ED Notes (Signed)
Discharge instructions, follow up care, and rx x2 reviewed with patient. Patient verbalized understanding. 

## 2016-01-05 NOTE — Telephone Encounter (Signed)
I didn't call her.  Is this for you, [name] [name]

## 2016-01-05 NOTE — Telephone Encounter (Signed)
Patient was using her cane today. She was made aware she needs to use walker at ALL times. She is having significant back spasms so she is going to ER now.

## 2016-01-05 NOTE — Telephone Encounter (Signed)
I would love her to go to the ER to get it checked out since hit head.  If refuses, get CT brain without since hit head.  Is she using her walker at ALL times?

## 2016-01-09 ENCOUNTER — Telehealth: Payer: Self-pay | Admitting: Neurology

## 2016-01-09 NOTE — Telephone Encounter (Signed)
Would you like me to encourage patient to use walker at all times and keep scheduled follow up?

## 2016-01-09 NOTE — Telephone Encounter (Signed)
PT wanted to let Dr Tat know she has fallen several times and wanted to know if she should come in sooner than April/Dawn [phone]

## 2016-01-09 NOTE — Telephone Encounter (Signed)
You can make her f/u a 1 month instead of 2 months (she decide to stay here and not go back to prior neuro??) and walker at all times

## 2016-01-09 NOTE — Telephone Encounter (Signed)
Appt moved to 01/26/16. Patient cancelling appt with [facility]. She is using a walker at all times.

## 2016-01-13 ENCOUNTER — Telehealth: Payer: Self-pay | Admitting: Neurology

## 2016-01-13 NOTE — Telephone Encounter (Signed)
FYI-Called pt to r/s 4/11 appt with Dr. [name].  She wanted me to let Dr. [name] and Dr. [name] know that she fell 1x last Thursday and 3x last Friday.  She stated that she will be going to see her PCP but wanted them to know as well.

## 2016-01-16 ENCOUNTER — Ambulatory Visit
Admission: RE | Admit: 2016-01-16 | Discharge: 2016-01-16 | Disposition: A | Discharge status: Home / Self Care | Payer: BLUE CROSS/BLUE SHIELD | Source: Ambulatory Visit | Attending: Family Medicine | Admitting: Family Medicine

## 2016-01-16 DIAGNOSIS — R9389 Abnormal findings on diagnostic imaging of other specified body structures: Secondary | ICD-10-CM

## 2016-01-16 DIAGNOSIS — M542 Cervicalgia: Secondary | ICD-10-CM

## 2016-01-16 MED ORDER — GADOBENATE DIMEGLUMINE 529 MG/ML IV SOLN
20.0000 mL | Freq: Once | INTRAVENOUS | Status: AC | PRN
  Administered 2016-01-16: 20 mL via INTRAVENOUS

## 2016-01-26 ENCOUNTER — Encounter: Payer: Self-pay | Admitting: Neurology

## 2016-01-26 ENCOUNTER — Ambulatory Visit (INDEPENDENT_AMBULATORY_CARE_PROVIDER_SITE_OTHER): Payer: BLUE CROSS/BLUE SHIELD | Admitting: Neurology

## 2016-01-26 VITALS — BP 130/76 | HR 82 | Ht 61.5 in | Wt 223.0 lb

## 2016-01-26 DIAGNOSIS — M4802 Spinal stenosis, cervical region: Secondary | ICD-10-CM

## 2016-01-26 DIAGNOSIS — E538 Deficiency of other specified B group vitamins: Secondary | ICD-10-CM

## 2016-01-26 DIAGNOSIS — K5901 Slow transit constipation: Secondary | ICD-10-CM

## 2016-01-26 DIAGNOSIS — G238 Other specified degenerative diseases of basal ganglia: Secondary | ICD-10-CM

## 2016-01-26 DIAGNOSIS — G903 Multi-system degeneration of the autonomic nervous system: Secondary | ICD-10-CM | POA: Diagnosis not present

## 2016-01-26 NOTE — Progress Notes (Signed)
[name] [name] was seen today in the movement disorders clinic for neurologic consultation at the request of [facility], [place].    The patient presents for a second opinion regarding falls and balance difficulties.  She has been seeing Dr. [name] since January, 2015 and most recently saw him on 11/25/2015 and saw his nurse practitioner on 12/14/2015.  I have taken a detailed review of the records from [facility] neurology.  Her initial complaint in January, 2015 was of dizziness and balance change.  By November, 2015, the patient's balance change and falls had become such a problem that Dr. [name] told the patient that he thought it was unsafe for her to live alone.  He noted that the patient was very short stepped in her gait and she was off balance and antalgic.  Over the course of time, she continued to have repetitive falls.  As of August, 2016 the patient began to complain of drooling.  In November, 2016, the patient began to complain of tremor when holding a cup, which she states has increased.  01/26/16 update:  The patient presents today for follow-up.  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  She has continued to have falls and fell on February 23 ended up in the emergency room.  They did not do a CT of the brain.  Fortunately, she did not lose consciousness with that fall.  She did fall 3 times the day after that but none since.  She has had some dizziness with the medication.  She did have labwork at our last visit.  Her zinc was normal.  Vitamin D was normal.  Celiac panel was normal.  RPR was negative.  Vitamin B12 was slightly low at 313 and I asked her to start a B12 supplement.  Folate was normal.  The patient wanted to follow-up here a little bit earlier.  States that the diplopia is getting worse.  She had an MRI of the cervical spine on 01/16/16 that I reviewed.  There is degenerative changes and significant NFS and C5  on the L, bilaterally at C6 and 7.  Neuroimaging has previously been performed.  It is available for my review today.  She had an MRI brain on 07/15/15 that was unremarkable   ALLERGIES:   Allergies  Allergen Reactions  . Aleve [Naproxen] Hives, Shortness Of Breath and Rash    Pt Avoids All Nsaids  . Zocor [Simvastatin] Other (See Comments)    "weird feeling all over body"    CURRENT MEDICATIONS:  Outpatient Encounter Prescriptions as of 01/26/2016  Medication Sig  . acetaminophen (TYLENOL) 650 MG CR tablet Take 650-1,300 mg by mouth every 8 (eight) hours as needed for pain.   Marland Kitchen aspirin 81 MG tablet Take 81 mg by mouth daily.  Marland Kitchen azelastine (ASTELIN) 0.1 % nasal spray Place 1 spray into both nostrils 2 (two) times daily.   . carbidopa-levodopa (SINEMET IR) 25-100 MG tablet Take 1 tablet by mouth 3 (three) times daily. (Patient taking differently: Take 0.5 tablets by mouth 3 (three) times daily. )  . Cholecalciferol (VITAMIN D PO) Take 5,000 Units by mouth daily.  . diphenhydramine-acetaminophen (TYLENOL PM) 25-500 MG TABS Take 1 tablet by mouth at bedtime as needed (pain).   Marland Kitchen docusate sodium (COLACE) 100 MG capsule Take 100 mg by mouth daily as needed for mild constipation.   Marland Kitchen escitalopram (LEXAPRO) 20 MG tablet Take 10 mg by mouth every  morning.   . fenofibrate 160 MG tablet Take 160 mg by mouth every morning.   . fluticasone (FLONASE) 50 MCG/ACT nasal spray Place 2 sprays into both nostrils every morning.   . loratadine (CLARITIN) 10 MG tablet Take 10 mg by mouth every morning.  Marland Kitchen losartan (COZAAR) 100 MG tablet Take 50 mg by mouth every morning.  . Menthol, Topical Analgesic, (ABSORBINE PAIN RELIEVING) 10 % GEL Apply topically.  . Menthol, Topical Analgesic, (BIOFREEZE) 4 % GEL Apply topically.  . montelukast (SINGULAIR) 10 MG tablet Take 10 mg by mouth at bedtime.  Marland Kitchen nystatin cream (MYCOSTATIN) Apply 1 application topically 2 (two) times daily as needed for dry skin.  .  progesterone (PROMETRIUM) 100 MG capsule Take 100 mg by mouth at bedtime.  Marland Kitchen Propylene Glycol (SYSTANE BALANCE OP) Place 2 drops into both eyes 2 (two) times daily as needed (dry eyes).   . vitamin B-12 (CYANOCOBALAMIN) 1000 MCG tablet Take 1,000 mcg by mouth daily.  . [DISCONTINUED] HYDROcodone-acetaminophen (NORCO/VICODIN) 5-325 MG tablet Take 1 tablet by mouth every 6 (six) hours as needed.  . [DISCONTINUED] Menthol (PERFORM PAIN RELIEVING) 3.1 % GEL Apply topically as needed (KNEE PAIN).  . [DISCONTINUED] methocarbamol (ROBAXIN) 500 MG tablet Take 1 tablet (500 mg total) by mouth 2 (two) times daily.  . [DISCONTINUED] trolamine salicylate (ASPERCREME) 10 % cream Apply 1 application topically as needed for muscle pain.   No facility-administered encounter medications on file as of 01/26/2016.    PAST MEDICAL HISTORY:   Past Medical History  Diagnosis Date  . HTN (hypertension)   . Depression   . Hyperlipidemia   . Polyneuropathy, diabetic ([place])     WALKS W/ CANE FOR BALANCE  . Type 2 diabetes mellitus ([place])   . Right ureteral stone   . Chronic lumbar radiculopathy     L5- S1  right leg  . 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 Arthritis     fingers,right shoulder  . History of kidney stones 05/12/15    surgery   . Frequency of urination   . SUI (stress urinary incontinence, female)   . Wears glasses   . OSA (obstructive sleep apnea)     moderate OSA per study 11-22-2007--  refused CPAP but used oxygen for 6 months at night,  states due to wt loss stopped using oxygen    PAST SURGICAL HISTORY:   Past Surgical History  Procedure Laterality Date  . Tubal ligation  1985  . Extracorporeal shock wave lithotripsy Right 08-18-2012  . Cardiovascular stress test  09-30-2007      normal Adenosine study/  no ischemia /  normal LV function and wall motion , ef 82%  . Transthoracic echocardiogram  09-23-2007    pseudonormal LV filling pattern,  ef  65-70%/  trivial AR/  mild LAE  . Cystoscopy with retrograde pyelogram, ureteroscopy and stent placement Right 05/12/2015    Procedure: CYSTOSCOPY WITH RETROGRADE PYELOGRAM, URETEROSCOPY,STONE EXTRACTION AND STENT PLACEMENT;  Surgeon: Franchot Gallo, MD;  Location: [facility];  Service: Urology;  Laterality: Right;  . Holmium laser application Right 0000000    Procedure: HOLMIUM LASER APPLICATION;  Surgeon: Franchot Gallo, MD;  Location: [facility];  Service: Urology;  Laterality: Right;    SOCIAL HISTORY:   Social History   Social History  . Marital Status: Widowed    Spouse Name: N/A  . Number of Children: 1  .  Years of Education: College   Occupational History  . office manager   .     Social History Main Topics  . Smoking status: Never Smoker   . Smokeless tobacco: Never Used  . Alcohol Use: 0.0 oz/week    0 Standard drinks or equivalent per week     Comment: once every 6 months  . Drug Use: No  . Sexual Activity: Not on file   Other Topics Concern  . Not on file   Social History Narrative   Patient lives at home with her dog name "Hot Rod".   Caffeine Use: 2-3 cups of coffee a day        FAMILY HISTORY:   Family Status  Relation Status Death Age  . Father Deceased 53    heart disease  . Mother Deceased 64    stroke  . Sister Alive     asthma  . Sister Deceased     breast cancer  . Sister Deceased     breast cancer    ROS:  A complete 10 system review of systems was obtained and was unremarkable apart from what is mentioned above.  PHYSICAL EXAMINATION:    VITALS:   Filed Vitals:   01/26/16 1251  BP: 130/76  Pulse: 82  Height: 5' 1.5" (1.562 m)  Weight: 223 lb (101.152 kg)   Pt placed into examining gown for complete examination.  GEN:  The patient appears stated age and is in NAD. HEENT:  Normocephalic, atraumatic.  The mucous membranes are moist. The superficial temporal arteries are without ropiness or  tenderness.  No fasciculations in tongue. CV:  RRR Lungs:  CTAB Neck/HEME:  There are no carotid bruits bilaterally.  Neurological examination:  Orientation: The patient is alert and oriented x3.  Cranial nerves: There is good facial symmetry with the exception of mild L ptosis.  Extraocular muscles are intact but she has some trouble with upgaze.  She has difficulty with smooth pursuit.  She has square wave jerks.   The visual fields are full to confrontational testing. The speech is fluent and clear.   Some trouble with the gutteral sounds.  Soft palate rises symmetrically and there is no tongue deviation. Hearing is intact to conversational tone. Sensation: Sensation is intact to light and pinprick throughout (facial, trunk, extremities). Vibration is intact at the bilateral big toe. There is no extinction with double simultaneous stimulation. There is no sensory dermatomal level identified. Motor: Strength is 5/5 in the bilateral upper and lower extremities.   Shoulder shrug is equal and symmetric.  There is no pronator drift.  Rare fasciculations in right gastroc but none elsewhere including more proximal mm and in arms, chest/back.     Movement examination: Tone: There is normal tone in the bilateral upper extremities.  The tone in the lower extremities is normal.  Abnormal movements: no tremor; no dyskinesia; there is abnormal posture of the head with right head tilt and the right ear approximates the right shoulder Coordination:  There is decremation with RAM's, seen with virtually all forms of RAMs on the right and with toe taps on the L Gait and Station: The patient has mild difficulty arising out of a deep-seated chair without the use of the hands (is able to do it on first attempt but she is slow). The patient's stride length is markedly decreased and she turns en bloc. She does much better with the walker  ASSESSMENT/PLAN:  1.  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  Talked to her about dx, pathophysiology, prognosis, difference between this and PD.  Her insurance will not pay for DaT scanning.  -talked to her about changing to CR formulation and then increasing dose because of dizziness but she thinks she can tolerate increasing this version.  Will slowly increase to 2 po tid.  -gave her information on cure PSP website and showed her this.  Talked about merry walker, which may be of value in the near future.  Told her that at some point, I may tell her to not walk at all.  Much of mortality related to falls 2.  Cervical spinal stenosis and NFS.  -Has an appt with Dr [name] re: degenerative changes of the cervical spine.  Told her that I don't think that this can explain all of her sx's, particularly the diplopia and shuffling.  Asked her to have Dr. [name] send me a copy of the notes 3.  Mild B12 deficiency  -is on B12 supplement 1061mcg daily 4.  Constipation  -given rancho recipe 5.  Much greater than 50% of this visit was spent in counseling with the patient and the family.  Total face to face time:  40 min

## 2016-01-26 NOTE — Patient Instructions (Addendum)
Constipation and MSA:  1.Rancho recipe for constipation in Parkinsons Disease:  -1 cup of bran, 2 cups of applesauce in 1 cup of prune juice 2.  Increase fiber intake (Metamucil,vegetables) 3.  Regular, moderate exercise can be beneficial. 4.  Avoid medications causing constipation, such as medications like antacids with calcium or magnesium 5.  Laxative overuse should be avoided. 6.  Stool softeners (Colace) can help with chronic constipation.  Increase Carbidopa Levodopa as follows: Take 2 tablets in the morning, 1 tablet in the afternoon, 1 in the evening for one week Then take 2 tablets in the morning, 1 tablet in the afternoon, 2 tablets in the evening for one week Then take 2 tablets in the morning, 2 tablets in the afternoon and 2 tablets in the evening

## 2016-02-06 ENCOUNTER — Telehealth: Payer: Self-pay | Admitting: Neurology

## 2016-02-06 NOTE — Telephone Encounter (Signed)
PT called and would like a call back at [phone]

## 2016-02-07 NOTE — Telephone Encounter (Signed)
Patient wanted to call to inform us that Dr. [name] is recommending Cervical spine surgery. She states she will be in the hospital 2-3 days and recovery will be 4-6 weeks. FYI.

## 2016-02-14 ENCOUNTER — Telehealth: Payer: Self-pay | Admitting: Neurology

## 2016-02-14 NOTE — Telephone Encounter (Signed)
Received call from Dr. Ernie Hew re: surgery.  Told her that while it certainly would help the spinal stenosis, I don't think that it will help the falling, shuffling or diplopia.  Her insurance won't pay for DaT but when she is MCR eligible we could pursue that.

## 2016-02-16 ENCOUNTER — Telehealth: Payer: Self-pay | Admitting: Neurology

## 2016-02-16 ENCOUNTER — Other Ambulatory Visit: Payer: Self-pay | Admitting: Neurosurgery

## 2016-02-16 NOTE — Telephone Encounter (Signed)
Patient made aware we did receive notes and made aware of this information per Dr Doristine Devoid documentation is previous phone note:  Received call from Dr. Ernie Hew re: surgery. Told her that while it certainly would help the spinal stenosis, I don't think that it will help the falling, shuffling or diplopia. Her insurance won't pay for DaT but when she is MCR eligible we could pursue that.

## 2016-02-16 NOTE — Telephone Encounter (Signed)
[name] [name]  23-Aug-2051 called wanting to know if you had received office notes from her Doctor Botero from 02/06/16 Please call [phone] Thank you

## 2016-02-20 ENCOUNTER — Telehealth: Payer: Self-pay | Admitting: Neurology

## 2016-02-20 NOTE — Telephone Encounter (Signed)
PT called in regards to her medication carbidopa-levodopa/Dawn   CB# [phone]

## 2016-02-20 NOTE — Telephone Encounter (Signed)
Are we sure that it is from the levodopa?  The disease itself can cause this and I have in my notes that the first sx that got her to the doctor in 2015 was dizziness and balance change.

## 2016-02-20 NOTE — Telephone Encounter (Signed)
Patient made aware that dizziness is probably from disease not from Levodopa, but she can go off of it for a few days and see if it helps. She will let us know.

## 2016-02-20 NOTE — Telephone Encounter (Signed)
Spoke with patient and she states she has been having a lot of dizziness. She spoke with her primary care and they suggested she may need to go off her Levodopa. Patient states she has been having this dizziness since she started the Levodopa. It has not gotten worse since increasing the medication. She does not feel dizzy when just sitting but only when she moves around/bends over/stands from sitting. Please advise.

## 2016-02-21 ENCOUNTER — Ambulatory Visit: Payer: BLUE CROSS/BLUE SHIELD | Admitting: Neurology

## 2016-03-01 ENCOUNTER — Ambulatory Visit (INDEPENDENT_AMBULATORY_CARE_PROVIDER_SITE_OTHER): Payer: BLUE CROSS/BLUE SHIELD | Admitting: Neurology

## 2016-03-01 ENCOUNTER — Encounter: Payer: Self-pay | Admitting: Neurology

## 2016-03-01 VITALS — BP 152/88 | HR 80 | Resp 18 | Ht 61.5 in | Wt 221.0 lb

## 2016-03-01 DIAGNOSIS — R269 Unspecified abnormalities of gait and mobility: Secondary | ICD-10-CM | POA: Diagnosis not present

## 2016-03-01 DIAGNOSIS — G4733 Obstructive sleep apnea (adult) (pediatric): Secondary | ICD-10-CM | POA: Diagnosis not present

## 2016-03-01 DIAGNOSIS — Z9989 Dependence on other enabling machines and devices: Principal | ICD-10-CM

## 2016-03-01 NOTE — Patient Instructions (Addendum)
Please continue using your CPAP regularly. While your insurance requires that you use CPAP at least 4 hours each night on 70% of the nights, I recommend, that you not skip any nights and use it throughout the night if you can. Getting used to CPAP and staying with the treatment long term does take time and patience and discipline. Untreated obstructive sleep apnea when it is moderate to severe can have an adverse impact on cardiovascular health and raise her risk for heart disease, arrhythmias, hypertension, congestive heart failure, stroke and diabetes. Untreated obstructive sleep apnea causes sleep disruption, nonrestorative sleep, and sleep deprivation. This can have an impact on your day to day functioning and cause daytime sleepiness and impairment of cognitive function, memory loss, mood disturbance, and problems focussing. Using CPAP regularly can improve these symptoms.  Keep up the good work! I will see you back in 12 months for sleep apnea check up.   Try to continue with your weight loss endeavor!  Use your walker at all times.  Good luck with your neck surgery.   You can try Melatonin at night for sleep: take 1 mg to 3 mg, one to 2 hours before your bedtime. You can go up to 5 mg if needed. It is over the counter and comes in pill form, chewable form and spray, if you prefer.

## 2016-03-01 NOTE — Progress Notes (Signed)
Subjective:    Patient ID: [name] [name] is a 65 y.o. female.  HPI     Interim history:  [name] [name] is a 65 year old right-handed woman with an underlying medical history of hypertension, diabetes, hyperlipidemia, LBP, depression, cervical spinal stenosis, obesity, neuropathy, recurrent falls and gait disorder, dizziness, who presents for follow-up consultation of her obstructive sleep apnea, on treatment with CPAP therapy. The patient is unaccompanied today. I first met her on 09/20/2015 at the request of her primary care physician, at which time she reported a prior diagnosis of OSA several years ago. She was invited back for sleep study. She had a split-night sleep study on 10/12/2015. Her baseline sleep efficiency was 73.1%, latency to sleep was 26 minutes, wake after sleep onset was 19.5 minutes with moderate sleep fragmentation noted. She had an increased percentage of light stage sleep, absence of REM sleep. She had moderate snoring. Total AHI was 33.5 per hour, average oxygen saturation was 93%, nadir was 77%. She was titrated on CPAP during the second part of the study with low sleep efficiency at 21.7% noted. She had several longer periods of wakefulness. CPAP was titrated from 5-6 cm. AHI was 0 per hour at the final pressure. Supine REM sleep was achieved with an oxygen nadir of 88%. I suggested we treat her with CPAP therapy at home at a pressure of 7 cm. She was seen in the interim by Cecille Rubin in February 2017 for sleep apnea follow-up. She has been following for her gait disorder with Dr. Carles Collet.   Today, 03/01/2016: I reviewed her CPAP compliance data from 01/30/2016 through 02/28/2016 which is a total of 30 days during which time she used her machine every night with percent used days greater than 4 hours at 53%, indicating suboptimal compliance with an average usage for all days of 4 hours and 10 minutes, residual AHI acceptable at 4.2 per hour, leak at times high with the 95th  percentile at 32.3 L/m, pressure of 7 cm with EPR of 3.  Today, 03/01/2016: She reports that she had several falls in February 2017. She has been using a rolling walker. She is scheduled to have neck surgery under Dr. [name] in May of this year, C4 through C6 ACDF is planned. She has no complaints about the CPAP but does not always sleep very well. She falls asleep fairly quickly around 9 PM but then wakes up a few hours later and then again around 2 AM. Sometimes she is awake for an extended period of time. She goes to the bathroom twice per night on average. She lives alone. She is planning to move into assisted living later this year. Her son lives close by. She has looked at several assisted living facilities including [facility] and has another appointment with a facility in [place] soon. She does not always drink enough water. She is currently primarily drinking diet sodas.  Previously: 09/20/2015: She has dizziness, for which she has been seeing Dr. [name] in our office since 11/24/2013. She reports a prior diagnosis of OSA. She had a sleep study in 2008 or 9. She was offered CPAP therapy but did not pursue this for fear of being unable to tolerate it. She was placed on oxygen therapy for about 6 months and felt better but then stopped it. She has lost weight and this last year in the realm of 25 pounds. She has been enrolled in Weight Watchers. She does not smoke, she drinks alcohol occasionally but does drink  a lot of caffeine in the form of coffee, about 3 cups per day and to bottles of soda. She goes to bed between 8 or 9 and has the TV on which stays on all night. She lives alone and has a small dog. She has 1 grown son who is local. She is retired. She is widowed. She has no family history of obstructive sleep apnea. Rise time is around 7 AM. Her Epworth sleepiness score is 11 out of 24 today, her fatigue score is 49 out of 63. She does not recall any family history of OSA. She would  be willing to be tested for sleep apnea again and consider CPAP therapy if the need arises. She reports no family history of Parkinson's disease. She has difficulty with her right side as and feeling weaker on the right side. She is afraid of falls. She has fallen repeatedly. She is in outpatient physical therapy at the neuro rehabilitation unit. She has nocturia usually twice per night. She has morning headaches about 3-4 times a month. She denies restless leg symptoms or leg twitching in her sleep. Her husband had reported her snoring.   She recently saw Dr. [name] on 09/14/2015 for follow-up. She reported 2 falls recently. I reviewed the office note. He voiced concern that she may have hemi-parkinsonism.  Her Past Medical History Is Significant For: Past Medical History  Diagnosis Date  . HTN (hypertension)   . Depression   . Hyperlipidemia   . Polyneuropathy, diabetic ([place])     WALKS W/ CANE FOR BALANCE  . Type 2 diabetes mellitus ([place])   . Right ureteral stone   . Chronic lumbar radiculopathy     L5- S1  right leg  . 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 Arthritis     fingers,right shoulder  . History of kidney stones 05/12/15    surgery   . Frequency of urination   . SUI (stress urinary incontinence, female)   . Wears glasses   . OSA (obstructive sleep apnea)     moderate OSA per study 11-22-2007--  refused CPAP but used oxygen for 6 months at night,  states due to wt loss stopped using oxygen  . 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 Tubal ligation  1985  . Extracorporeal shock wave lithotripsy Right 08-18-2012  . Cardiovascular stress test  09-30-2007      normal Adenosine study/  no ischemia /  normal LV function and wall motion , ef 82%  . Transthoracic echocardiogram  09-23-2007    pseudonormal LV filling pattern,  ef 65-70%/  trivial AR/   mild LAE  . Cystoscopy with retrograde pyelogram, ureteroscopy and stent placement Right 05/12/2015    Procedure: CYSTOSCOPY WITH RETROGRADE PYELOGRAM, URETEROSCOPY,STONE EXTRACTION AND STENT PLACEMENT;  Surgeon: Franchot Gallo, MD;  [facility]  Service: Urology;  Laterality: Right;  . Holmium laser application Right 9/76/7341    Procedure: HOLMIUM LASER APPLICATION;  Surgeon: Franchot Gallo, MD;  Location: [facility];  Service: Urology;  Laterality: Right;    Her Family History Is Significant For: Family History  Problem Relation Age of Onset  . Hypertension    . Stroke    . Heart Problems Father   . Stroke Mother   . Breast cancer Sister     Her  Social History Is Significant For: Social History   Social History  . Marital Status: Widowed    Spouse Name: N/A  . Number of Children: 1  . Years of Education: College   Occupational History  . office manager   .     Social History Main Topics  . Smoking status: Never Smoker   . Smokeless tobacco: Never Used  . Alcohol Use: 0.0 oz/week    0 Standard drinks or equivalent per week     Comment: once every 6 months  . Drug Use: No  . Sexual Activity: Not Asked   Other Topics Concern  . None   Social History Narrative   Patient lives at home with her dog name "Hot Rod".   Caffeine Use: 2-3 cups of coffee a day        Her Allergies Are:  Allergies  Allergen Reactions  . Aleve [Naproxen] Hives, Shortness Of Breath and Rash    Pt Avoids All Nsaids  . Zocor [Simvastatin] Other (See Comments)    "weird feeling all over body"  :   Her Current Medications Are:  Outpatient Encounter Prescriptions as of 03/01/2016  Medication Sig  . acetaminophen (TYLENOL) 650 MG CR tablet Take 650-1,300 mg by mouth every 8 (eight) hours as needed for pain.   Marland Kitchen aspirin 81 MG tablet Take 81 mg by mouth daily.  Marland Kitchen azelastine (ASTELIN) 0.1 % nasal spray Place 1 spray into both nostrils 2 (two)  times daily.   . Cholecalciferol (VITAMIN D PO) Take 5,000 Units by mouth daily.  . diphenhydramine-acetaminophen (TYLENOL PM) 25-500 MG TABS Take 1 tablet by mouth at bedtime as needed (pain).   Marland Kitchen docusate sodium (COLACE) 100 MG capsule Take 100 mg by mouth daily as needed for mild constipation.   Marland Kitchen escitalopram (LEXAPRO) 20 MG tablet Take 10 mg by mouth every morning.   . fenofibrate 160 MG tablet Take 160 mg by mouth every morning.   . fluticasone (FLONASE) 50 MCG/ACT nasal spray Place 2 sprays into both nostrils every morning.   . loratadine (CLARITIN) 10 MG tablet Take 10 mg by mouth every morning.  Marland Kitchen losartan (COZAAR) 100 MG tablet Take 50 mg by mouth every morning.  . Menthol, Topical Analgesic, (ABSORBINE PAIN RELIEVING) 10 % GEL Apply topically.  . Menthol, Topical Analgesic, (BIOFREEZE) 4 % GEL Apply topically.  . montelukast (SINGULAIR) 10 MG tablet Take 10 mg by mouth at bedtime.  Marland Kitchen nystatin cream (MYCOSTATIN) Apply 1 application topically 2 (two) times daily as needed for dry skin.  . progesterone (PROMETRIUM) 100 MG capsule Take 100 mg by mouth at bedtime.  Marland Kitchen Propylene Glycol (SYSTANE BALANCE OP) Place 2 drops into both eyes 2 (two) times daily as needed (dry eyes).   . carbidopa-levodopa (SINEMET IR) 25-100 MG tablet Take 1 tablet by mouth 3 (three) times daily. (Patient not taking: Reported on 03/01/2016)  . [DISCONTINUED] vitamin B-12 (CYANOCOBALAMIN) 1000 MCG tablet Take 1,000 mcg by mouth daily.   No facility-administered encounter medications on file as of 03/01/2016.  :  Review of Systems:  Out of a complete 14 point review of systems, all are reviewed and negative with the exception of these symptoms as listed below:   Review of Systems  Neurological:       Patient feels like she is doing well with CPAP. States that she does not sleep for very long at night. No new concerns.  Patient reports that she was recently diagnosed by Dr. Carles Collet with  MSA-C, and is due to have  surgery May 12 for an anterior cervical fusion.     Objective:  Neurologic Exam  Physical Exam Physical Examination:   Filed Vitals:   03/01/16 1522  BP: 152/88  Pulse: 80  Resp: 18   General Examination: The patient is a very pleasant 65 y.o. female in no acute distress. She appears well-developed and well-nourished and well groomed. She is obese. She is in good spirits today.  HEENT: Normocephalic, atraumatic, pupils are equal, round and reactive to light and accommodation. Extraocular tracking is fairly good with slight breakdown of smooth pursuit. She may have a mild degree of increased tone in her neck. She has fairly good facial animation and perhaps a decrease in eye blink rate. Hearing is grossly intact. Speech is clear with no dysarthria noted. There is no hypophonia. There is no lip, neck/head, jaw or voice tremor. Neck is supple with full range of passive and active motion. There are no carotid bruits on auscultation. Oropharynx exam reveals: moderate to severe mouth dryness, adequate dental hygiene and moderate airway crowding, due to smaller airway entry, larger uvula, and tonsils in place which are actually smaller. Mallampati is class II. Tongue protrudes centrally and palate elevates symmetrically.   Chest: Clear to auscultation without wheezing, rhonchi or crackles noted.  Heart: S1+S2+0, regular and normal without murmurs, rubs or gallops noted.   Abdomen: Soft, non-tender and non-distended with normal bowel sounds appreciated on auscultation.  Extremities: There is trace pitting edema in the distal lower extremities bilaterally. Pedal pulses are intact.  Skin: Warm and dry without trophic changes noted. There are no varicose veins.  Musculoskeletal: exam reveals no obvious joint deformities, tenderness or joint swelling or erythema.   Neurologically:  Mental status: The patient is awake, alert and oriented in all 4 spheres. Her immediate and remote memory,  attention, language skills and fund of knowledge are appropriate. There is no evidence of aphasia, agnosia, apraxia or anomia. Speech is clear with normal prosody and enunciation. Thought process is linear. Mood is normal and affect is normal.  Cranial nerves II - XII are as described above under HEENT exam. In addition: shoulder shrug is normal with equal shoulder height noted. Motor exam: Normal bulk, strength and tone is noted. Romberg is not tested because she does not stand well narrow based. She stands up with mild to moderate difficulty, fairly wide-based. She has fine motor difficulty bilaterally. She has no significant intention tremor.  Sensory exam: intact to light touch in the upper extremities and decrease to all modalities in both lower extremities. Her balance is impaired. She walks slowly with a rolling walker. She's not able to do tandem walk.   Assessment and Plan:   In summary, FEDORA KNISELY is a very pleasant 65 year old female with an underlying medical history of hypertension, diabetes, hyperlipidemia, LBP, depression, cervical spinal stenosis, obesity, neuropathy, recurrent falls and gait disorder and dizziness (now followed by Dr. Carles Collet for this), who presents for follow-up consultation of her obstructive sleep apnea after her split-night sleep study in November 2016. We went over her test results in detail today. We also reviewed her compliance data in detail. She is compliant with treatment for the most part, lately, in the last 30 days she has not been sleeping through the night very well. She is advised to try melatonin for this and I wrote this down in her instructions. Anything else me impaired balance I explained. She is advised to continue to  use her rolling walker at all times. She is scheduled for neck surgery next month. She is advised to stay better hydrated with water and decrease her soda intake. She is encouraged to continue to pursue weight loss and commended for her  weight loss endeavors. She is in Weight Watchers. She is advised that assisted living will ultimately be safer for her as she has fallen and she lives alone. She is in the process of looking at different assisted living facilities.  We talked about the sleep apnea diagnosis, its prognosis and treatment. She is encouraged to continue with CPAP regularly and I explained the importance of being compliant with PAP treatment, not only for insurance purposes but primarily to improve Her symptoms, and for the patient's long term health benefit, including to reduce Her cardiovascular risks. I would like to see her back in a year, sooner if needed. I answered all her questions today and she was in agreement.  I spent 25 minutes in total face-to-face time with the patient, more than 50% of which was spent in counseling and coordination of care, reviewing test results, reviewing medication and discussing or reviewing the diagnosis of gait disorder, balance problems, sleep apnea, the prognosis and treatment options.

## 2016-03-02 ENCOUNTER — Ambulatory Visit: Payer: BLUE CROSS/BLUE SHIELD | Admitting: Neurology

## 2016-03-13 ENCOUNTER — Encounter: Payer: Self-pay | Admitting: Neurology

## 2016-03-13 ENCOUNTER — Ambulatory Visit (INDEPENDENT_AMBULATORY_CARE_PROVIDER_SITE_OTHER): Payer: BLUE CROSS/BLUE SHIELD | Admitting: Neurology

## 2016-03-13 VITALS — BP 138/72 | HR 86 | Ht 61.5 in | Wt 224.0 lb

## 2016-03-13 DIAGNOSIS — M4802 Spinal stenosis, cervical region: Secondary | ICD-10-CM | POA: Diagnosis not present

## 2016-03-13 DIAGNOSIS — G903 Multi-system degeneration of the autonomic nervous system: Secondary | ICD-10-CM

## 2016-03-13 DIAGNOSIS — G238 Other specified degenerative diseases of basal ganglia: Secondary | ICD-10-CM

## 2016-03-13 NOTE — Patient Instructions (Signed)
Restart Carbidopa Levodopa 25/100 as follows:  Take one tablet three times daily for one week Then increase to 2 tablets in the morning, 2 tablets in the afternoon, 1 tablet in the evening.

## 2016-03-13 NOTE — Progress Notes (Signed)
Note routed to Dr [name].

## 2016-03-13 NOTE — Progress Notes (Signed)
  [name] [name] was seen today in the movement disorders clinic for neurologic consultation at the request of DEWEY,Alaynah, MD.    The patient presents for a second opinion regarding falls and balance difficulties.  She has been seeing Dr. Penumalli since January, 2015 and most recently saw him on 11/25/2015 and saw his nurse practitioner on 12/14/2015.  I have taken a detailed review of the records from [facility].  Her initial complaint in January, 2015 was of dizziness and balance change.  By November, 2015, the patient's balance change and falls had become such a problem that Dr. Penumalli told the patient that he thought it was unsafe for her to live alone.  He noted that the patient was very short stepped in her gait and she was off balance and antalgic.  Over the course of time, she continued to have repetitive falls.  As of August, 2016 the patient began to complain of drooling.  In November, 2016, the patient began to complain of tremor when holding a cup, which she states has increased.  01/26/16 update:  The patient presents today for follow-up.  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  She has continued to have falls and fell on February 23 ended up in the emergency room.  They did not do a CT of the brain.  Fortunately, she did not lose consciousness with that fall.  She did fall 3 times the day after that but none since.  She has had some dizziness with the medication.  She did have labwork at our last visit.  Her zinc was normal.  Vitamin D was normal.  Celiac panel was normal.  RPR was negative.  Vitamin B12 was slightly low at 313 and I asked her to start a B12 supplement.  Folate was normal.  The patient wanted to follow-up here a little bit earlier.  States that the diplopia is getting worse.  She had an MRI of the cervical spine on 01/16/16 that I reviewed.  There is degenerative changes and significant NFS and C5  on the L, bilaterally at C6 and 7.  03/13/16 update:  The patient presents today for follow-up.  She is accompanied by her in-home caregiver who supplements the history.  Her caregiver now comes a few hours to days a week.  I have reviewed records since last visit.  The patient saw Dr. Botero on 02/06/2016 and his notes stated that there was no doubt that the patient had cervical stenosis but he was not sure how much the surgery would help.  He told the patient to follow back up with me and my recommendations.  I subsequently got a call from the patient's primary care physician and she asked me the same question and I stated that while the surgery may help her pain, it was not going to help her falls, shuffling, or diplopia and the anesthesia could actually set the MSA back.  Last visit, I did increase her carbidopa/levodopa 25/100-2 tablets 3 times per day.  I offered to change her to the extended release to see if that would help the dizziness but she did not want to do that.  She eventually called me and stated that she was dizzy and stated that her primary care physician said that she may need discontinue the medication.  I asked her if she was sure that it was from the medication because dizziness was one of her presenting complaints   at onset in 2015.  I did tell her that she could hold the medication and see if the dizziness changed.  The patient states that she d/c the medication.  She states that she has fallen 4 times, 2 prior to the d/c of levodopa and 2 after.  She really noticed no change in the dizziness and in fact states that she is more dizzy now.  The patient did see Dr. [name] on April 20 in follow-up for her sleep apnea.  She states that she does not have to see Dr. [name] for another year and it was just recommended that she use her CPAP more and try melatonin.  Neuroimaging has previously been performed.  It is available for my review today.  She had an MRI brain on 07/15/15 that was  unremarkable   ALLERGIES:   Allergies  Allergen Reactions  . Aleve [Naproxen] Hives, Shortness Of Breath and Rash    Pt Avoids All Nsaids  . Zocor [Simvastatin] Other (See Comments)    "weird feeling all over body"    CURRENT MEDICATIONS:  Outpatient Encounter Prescriptions as of 03/13/2016  Medication Sig  . acetaminophen (TYLENOL) 650 MG CR tablet Take 650-1,300 mg by mouth every 8 (eight) hours as needed for pain.   Marland Kitchen aspirin 81 MG tablet Take 81 mg by mouth daily.  Marland Kitchen azelastine (ASTELIN) 0.1 % nasal spray Place 1 spray into both nostrils 2 (two) times daily.   . Cholecalciferol (VITAMIN D PO) Take 5,000 Units by mouth daily.  Marland Kitchen docusate sodium (COLACE) 100 MG capsule Take 100 mg by mouth daily as needed for mild constipation.   Marland Kitchen escitalopram (LEXAPRO) 20 MG tablet Take 10 mg by mouth every morning.   . fenofibrate 160 MG tablet Take 160 mg by mouth every morning.   . fluticasone (FLONASE) 50 MCG/ACT nasal spray Place 2 sprays into both nostrils every morning.   . loratadine (CLARITIN) 10 MG tablet Take 10 mg by mouth every morning.  Marland Kitchen losartan (COZAAR) 100 MG tablet Take 50 mg by mouth every morning.  . Melatonin 5 MG CAPS Take by mouth.  . Menthol, Topical Analgesic, (ABSORBINE PAIN RELIEVING) 10 % GEL Apply topically.  . Menthol, Topical Analgesic, (BIOFREEZE) 4 % GEL Apply topically.  . montelukast (SINGULAIR) 10 MG tablet Take 10 mg by mouth at bedtime.  Marland Kitchen nystatin cream (MYCOSTATIN) Apply 1 application topically 2 (two) times daily as needed for dry skin.  . progesterone (PROMETRIUM) 100 MG capsule Take 100 mg by mouth at bedtime.  Marland Kitchen Propylene Glycol (SYSTANE BALANCE OP) Place 2 drops into both eyes 2 (two) times daily as needed (dry eyes).   . [DISCONTINUED] carbidopa-levodopa (SINEMET IR) 25-100 MG tablet Take 1 tablet by mouth 3 (three) times daily. (Patient not taking: Reported on 03/01/2016)  . [DISCONTINUED] diphenhydramine-acetaminophen (TYLENOL PM) 25-500 MG TABS Take  1 tablet by mouth at bedtime as needed (pain).    No facility-administered encounter medications on file as of 03/13/2016.    PAST MEDICAL HISTORY:   Past Medical History  Diagnosis Date  . HTN (hypertension)   . Depression   . Hyperlipidemia   . Polyneuropathy, diabetic ([place])     WALKS W/ CANE FOR BALANCE  . Type 2 diabetes mellitus ([place])   . Right ureteral stone   . Chronic lumbar radiculopathy     L5- S1  right leg  . 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  Arthritis     fingers,right shoulder  . History of kidney stones 05/12/15    surgery   . Frequency of urination   . SUI (stress urinary incontinence, female)   . Wears glasses   . OSA (obstructive sleep apnea)     moderate OSA per study 11-22-2007--  refused CPAP but used oxygen for 6 months at night,  states due to wt loss stopped using oxygen  . 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 Tubal ligation  1985  . Extracorporeal shock wave lithotripsy Right 08-18-2012  . Cardiovascular stress test  09-30-2007      normal Adenosine study/  no ischemia /  normal LV function and wall motion , ef 82%  . Transthoracic echocardiogram  09-23-2007    pseudonormal LV filling pattern,  ef 65-70%/  trivial AR/  mild LAE  . Cystoscopy with retrograde pyelogram, ureteroscopy and stent placement Right 05/12/2015    Procedure: CYSTOSCOPY WITH RETROGRADE PYELOGRAM, URETEROSCOPY,STONE EXTRACTION AND STENT PLACEMENT;  Surgeon: Franchot Gallo, MD;  Location: [facility];  Service: Urology;  Laterality: Right;  . Holmium laser application Right 0000000    Procedure: HOLMIUM LASER APPLICATION;  Surgeon: Franchot Gallo, MD;  Location: [facility];  Service: Urology;  Laterality: Right;    SOCIAL HISTORY:   Social History   Social History  . Marital Status: Widowed    Spouse Name: N/A  . Number of  Children: 1  . Years of Education: College   Occupational History  . office manager   .     Social History Main Topics  . Smoking status: Never Smoker   . Smokeless tobacco: Never Used  . Alcohol Use: 0.0 oz/week    0 Standard drinks or equivalent per week     Comment: once every 6 months  . Drug Use: No  . Sexual Activity: Not on file   Other Topics Concern  . Not on file   Social History Narrative   Patient lives at home with her dog name "Hot Rod".   Caffeine Use: 2-3 cups of coffee a day        FAMILY HISTORY:   Family Status  Relation Status Death Age  . Father Deceased 16    heart disease  . Mother Deceased 17    stroke  . Sister Alive     asthma  . Sister Deceased     breast cancer  . Sister Deceased     breast cancer    ROS:  A complete 10 system review of systems was obtained and was unremarkable apart from what is mentioned above.  PHYSICAL EXAMINATION:    VITALS:   Filed Vitals:   03/13/16 1341  BP: 138/72  Pulse: 86  Height: 5' 1.5" (1.562 m)  Weight: 224 lb (101.606 kg)     GEN:  The patient appears stated age and is in NAD. HEENT:  Normocephalic, atraumatic.  The mucous membranes are moist. The superficial temporal arteries are without ropiness or tenderness.  No fasciculations in tongue. CV:  RRR Lungs:  CTAB Neck/HEME:  There are no carotid bruits bilaterally.  Neurological examination:  Orientation: The patient is alert and oriented x3.  Cranial nerves: There is good facial symmetry with the exception of mild L ptosis.  Extraocular muscles are intact but she has some trouble with upgaze.  She has difficulty with smooth pursuit.  She  has square wave jerks.   The visual fields are full to confrontational testing. The speech is fluent and clear.   Some trouble with the gutteral sounds.  Soft palate rises symmetrically and there is no tongue deviation. Hearing is intact to conversational tone. Sensation: Sensation is intact to light touch  throughout Motor: Strength is 5/5 in the bilateral upper and lower extremities.   Shoulder shrug is equal and symmetric.  There is no pronator drift.    Movement examination: Tone: There is normal tone in the bilateral upper extremities.  The tone in the lower extremities is normal.  Abnormal movements: no tremor; no dyskinesia; there is abnormal posture of the head with right head tilt and the right ear approximates the right shoulder Coordination:  There is decremation with RAM's, seen with virtually all forms of RAMs on the right Gait and Station: The patient uses the walker to arise out of the chair today. The patient's stride length is markedly decreased and she turns en bloc. She does much better with the walker  ASSESSMENT/PLAN:  1.  MSA-C  -long discussion with the patient again today.  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  Talked to her about dx, pathophysiology, prognosis, difference between this and PD.  Her insurance will not pay for DaT scanning.  -The dizziness did not change when she discontinued the levodopa.  For now, I would recommend that we slowly re-titrate back up on it, primarily because she is going to be headed into surgery.  Will re-titrate to 2/2/1 (was 2 po tid) as not enough time before surgery to titrate to 2 po tid.  She was not orthostatic in the office today  -gave her information on cure PSP website and showed her this.  Talked about merry walker, which may be of value in the near future.  Told her that at some point, I may tell her to not walk at all.  Much of mortality related to falls 2.  Cervical spinal stenosis and NFS.  -I reiterated to the patient that she most certainly has degenerative changes and cervical stenosis, but I do not think that surgery is going to help balance, falls, shuffling, diplopia or  dizziness.  She initially stated that her goal was to help dizziness, and I told her I would not go through with the surgery if that was the goal.  She then stated she was hoping to decrease neck pain and stiffness, and the surgery may help those things.  I did tell her that general anesthesia involved with this surgery may make MSA symptoms worse.  She likely will need therapy after this. 3.  Mild B12 deficiency  -is on B12 supplement 105mcg daily 4.  Constipation  -given rancho recipe 5.  Much greater than 50% of this visit was spent in counseling with the patient and the caregiver.  Total face to face time:  25 min

## 2016-03-14 NOTE — Pre-Procedure Instructions (Signed)
[name] [name]  03/14/2016      [id number] - [place], [place] - [address] [place] [address] Phone: [phone] Fax: [phone]    Your procedure is scheduled on Friday, May 12th, 2017.  Report to [facility] Admitting at 6:00 A.M.   Call this number if you have problems the morning of surgery:  [phone]   Remember:  Do not eat food or drink liquids after midnight.   Take these medicines the morning of surgery with A SIP OF WATER: Acetaminophen (Tylenol) if needed, Azelastine (Astelin) nasal spray, Escitalopram (Lexapro), Flonase nasal spray, Loratadine (Claritin).  7 days prior to surgery, stop taking: Aspirin, NSAIDS, Aleve, Naproxen, Ibuprofen, Advil, Motrin, BC's, Goody's, Fish oil, all herbal medications, and all vitamins.    Do not wear jewelry, make-up or nail polish.  Do not wear lotions, powders, or perfumes.  You may NOT wear deodorant.  Do not shave 48 hours prior to surgery.    Do not bring valuables to the hospital.   [facility] is not responsible for any belongings or valuables.  Contacts, dentures or bridgework may not be worn into surgery.  Leave your suitcase in the car.  After surgery it may be brought to your room.  For patients admitted to the hospital, discharge time will be determined by your treatment team.  Patients discharged the day of surgery will not be allowed to drive home.   Special instructions:  See attached.   Please read over the following fact sheets that you were given. Pain Booklet, Coughing and Deep Breathing, MRSA Information and Surgical Site Infection Prevention      How to Manage Your Diabetes Before and After Surgery  Why is it important to control my blood sugar before and after surgery? . Improving blood sugar levels before and after surgery helps healing and can limit problems. . A way of improving blood sugar control is eating a healthy diet by: o  Eating less sugar  and carbohydrates o  Increasing activity/exercise o  Talking with your doctor about reaching your blood sugar goals . High blood sugars (greater than 180 mg/dL) can raise your risk of infections and slow your recovery, so you will need to focus on controlling your diabetes during the weeks before surgery. . Make sure that the doctor who takes care of your diabetes knows about your planned surgery including the date and location.  How do I manage my blood sugar before surgery? . Check your blood sugar at least 4 times a day, starting 2 days before surgery, to make sure that the level is not too high or low. o Check your blood sugar the morning of your surgery when you wake up and every 2 hours until you get to the Short Stay unit. . If your blood sugar is less than 70 mg/dL, you will need to treat for low blood sugar: o Do not take insulin. o Treat a low blood sugar (less than 70 mg/dL) with  cup of clear juice (cranberry or apple), 4 glucose tablets, OR glucose gel. o Recheck blood sugar in 15 minutes after treatment (to make sure it is greater than 70 mg/dL). If your blood sugar is not greater than 70 mg/dL on recheck, call [phone] for further instructions. . Report your blood sugar to the short stay nurse when you get to Short Stay.  . If you are admitted to the hospital after surgery: o Your blood sugar will be  checked by the staff and you will probably be given insulin after surgery (instead of oral diabetes medicines) to make sure you have good blood sugar levels. o The goal for blood sugar control after surgery is 80-180 mg/dL.

## 2016-03-15 ENCOUNTER — Encounter (HOSPITAL_COMMUNITY)
Admission: RE | Admit: 2016-03-15 | Discharge: 2016-03-15 | Disposition: A | Discharge status: Home / Self Care | Payer: BLUE CROSS/BLUE SHIELD | Source: Ambulatory Visit | Attending: Neurosurgery | Admitting: Neurosurgery

## 2016-03-15 ENCOUNTER — Encounter (HOSPITAL_COMMUNITY): Payer: Self-pay

## 2016-03-15 DIAGNOSIS — E785 Hyperlipidemia, unspecified: Secondary | ICD-10-CM | POA: Insufficient documentation

## 2016-03-15 DIAGNOSIS — Z01818 Encounter for other preprocedural examination: Secondary | ICD-10-CM | POA: Diagnosis present

## 2016-03-15 DIAGNOSIS — Z01812 Encounter for preprocedural laboratory examination: Secondary | ICD-10-CM | POA: Insufficient documentation

## 2016-03-15 DIAGNOSIS — Z79899 Other long term (current) drug therapy: Secondary | ICD-10-CM | POA: Diagnosis not present

## 2016-03-15 DIAGNOSIS — M4802 Spinal stenosis, cervical region: Secondary | ICD-10-CM | POA: Insufficient documentation

## 2016-03-15 DIAGNOSIS — I10 Essential (primary) hypertension: Secondary | ICD-10-CM | POA: Diagnosis not present

## 2016-03-15 DIAGNOSIS — Z7982 Long term (current) use of aspirin: Secondary | ICD-10-CM | POA: Insufficient documentation

## 2016-03-15 DIAGNOSIS — E119 Type 2 diabetes mellitus without complications: Secondary | ICD-10-CM | POA: Insufficient documentation

## 2016-03-15 DIAGNOSIS — G4733 Obstructive sleep apnea (adult) (pediatric): Secondary | ICD-10-CM | POA: Diagnosis not present

## 2016-03-15 HISTORY — DX: Reserved for inherently not codable concepts without codable children: IMO0001

## 2016-03-15 HISTORY — DX: Anesthesia of skin: R20.0

## 2016-03-15 HISTORY — DX: Paresthesia of skin: R20.2

## 2016-03-15 HISTORY — DX: Unspecified hearing loss, unspecified ear: H91.90

## 2016-03-15 HISTORY — DX: Dizziness and giddiness: R42

## 2016-03-15 HISTORY — DX: Unspecified urinary incontinence: R32

## 2016-03-15 LAB — CBC
HCT: 43.7 % (ref 36.0–46.0)
HEMOGLOBIN: 13.8 g/dL (ref 12.0–15.0)
MCH: 28.6 pg (ref 26.0–34.0)
MCHC: 31.6 g/dL (ref 30.0–36.0)
MCV: 90.7 fL (ref 78.0–100.0)
Platelets: 229 10*3/uL (ref 150–400)
RBC: 4.82 MIL/uL (ref 3.87–5.11)
RDW: 13.4 % (ref 11.5–15.5)
WBC: 4.5 10*3/uL (ref 4.0–10.5)

## 2016-03-15 LAB — BASIC METABOLIC PANEL
ANION GAP: 11 (ref 5–15)
BUN: 14 mg/dL (ref 6–20)
CHLORIDE: 104 mmol/L (ref 101–111)
CO2: 26 mmol/L (ref 22–32)
Calcium: 10 mg/dL (ref 8.9–10.3)
Creatinine, Ser: 0.67 mg/dL (ref 0.44–1.00)
GFR calc non Af Amer: >60 mL/min (ref >60)
Glucose, Bld: 122 mg/dL — ABNORMAL HIGH (ref 65–99)
POTASSIUM: 4.8 mmol/L (ref 3.5–5.1)
SODIUM: 141 mmol/L (ref 135–145)

## 2016-03-15 LAB — SURGICAL PCR SCREEN
MRSA, PCR: NEGATIVE
STAPHYLOCOCCUS AUREUS: NEGATIVE

## 2016-03-15 NOTE — Progress Notes (Signed)
PCP - Dr. Rachell Cipro Cardiologist- denies Neurologist - Dr. Carles Collet  EKG- 05/11/16 CXR - denies Echo- 2008 Stress test - 2008 Cardiac Cath - denies  Patient denies chest pain and shortness of breath at PAT appointment.    Patient states that she checks her blood sugar twice a day and that her fasting glucose is 110-130.  Patient informs nurse that she has not needed diabetes medication since December.

## 2016-03-15 NOTE — Progress Notes (Addendum)
Anesthesia Chart Review:  Pt is a 65 year old female scheduled for C4-5, C5-6, C6-7 ACDF on 03/23/2016 with Dr. [name].   PCP is Dr. Rachell Cipro. Neurologist is Dr. Carles Collet.   PMH includes:  HTN, OSA, DM (diet controlled), hyyperlipidemia, multiple system atrophy. Never smoker. BMI 42. S/p cystoscopy 05/12/15.   Medications include: ASA, fenofibrate, losartan  Preoperative labs reviewed. HgbA1c pending, glucose 122  EKG 05/12/15: NSR. Left axis deviation. Anterior infarct, age undetermined. No significant change since 09/22/07 EKG per Dr. Jacalyn Lefevre interpretation.   Willeen Cass, FNP-BC [facility]/Anesthesiology Phone: [phone] 03/15/2016 4:57 PM

## 2016-03-16 LAB — HEMOGLOBIN A1C
HEMOGLOBIN A1C: 6.4 % — AB (ref 4.8–5.6)
MEAN PLASMA GLUCOSE: 137 mg/dL

## 2016-03-22 NOTE — H&P (Signed)
[name] [name] is an 65 y.o. female.   Chief Complaint: falls HPI: patient seen with history of multiple falls without no pain or sensory changes . She has difficulties walking specially going up and down stairs. Had a cervical mri which showed stenosis and was referred to [place] by her neurologist. She has a diagnosis of multiple system cerebral atrophy syndrome.   Past Medical History  Diagnosis Date  . HTN (hypertension)   . Depression   . Hyperlipidemia   . Polyneuropathy, diabetic ([place])     WALKS W/ CANE FOR BALANCE  . Right ureteral stone   . Chronic lumbar radiculopathy     L5- S1  right leg  . 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 Arthritis     fingers,right shoulder  . History of kidney stones 05/12/15    surgery   . Frequency of urination   . SUI (stress urinary incontinence, female)   . Wears glasses   . OSA (obstructive sleep apnea)     moderate OSA per study 11-22-2007--  refused CPAP but used oxygen for 6 months at night,  states due to wt loss stopped using oxygen  . 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 Shortness of breath dyspnea   . Dizziness   . Numbness and tingling in hands   . Type 2 diabetes mellitus ([facility])     Type II - Diet controlled  . Hard of hearing   . Urinary incontinence     Past Surgical History  Procedure Laterality Date  . Tubal ligation  1985  . Extracorporeal shock wave lithotripsy Right 08-18-2012  . Cardiovascular stress test  09-30-2007      normal Adenosine study/  no ischemia /  normal LV function and wall motion , ef 82%  . Transthoracic echocardiogram  09-23-2007    pseudonormal LV filling pattern,  ef 65-70%/  trivial AR/  mild LAE  . Cystoscopy with retrograde pyelogram, ureteroscopy and stent placement Right 05/12/2015    Procedure: CYSTOSCOPY WITH RETROGRADE PYELOGRAM, URETEROSCOPY,STONE EXTRACTION AND STENT PLACEMENT;  Surgeon: Franchot Gallo, MD;  Location: [facility];   Service: Urology;  Laterality: Right;  . Holmium laser application Right 0000000    Procedure: HOLMIUM LASER APPLICATION;  Surgeon: Franchot Gallo, MD;  Location: [facility];  Service: Urology;  Laterality: Right;  . Colonoscopy      Family History  Problem Relation Age of Onset  . Hypertension    . Stroke    . Heart Problems Father   . Stroke Mother   . Breast cancer Sister    Social History:  reports that she has never smoked. She has never used smokeless tobacco. She reports that she drinks alcohol. She reports that she does not use illicit drugs.  Allergies:  Allergies  Allergen Reactions  . Aleve [Naproxen] Hives, Shortness Of Breath and Rash    Pt Avoids All Nsaids  . Zocor [Simvastatin] Other (See Comments)    "weird feeling all over body"    No prescriptions prior to admission    No results found for this or any previous visit (from the past 48 hour(s)). No results found.  Review of Systems  Constitutional: Negative.   HENT: Positive for tinnitus.   Eyes: Positive for double vision.  Respiratory: Positive for shortness of breath.   Cardiovascular: Positive for leg swelling.  Gastrointestinal: Positive for abdominal pain.  Genitourinary: Positive for  urgency.  Skin: Negative.   Neurological: Positive for dizziness.  Endo/Heme/Allergies: Negative.   Psychiatric/Behavioral: Negative.     There were no vitals taken for this visit. Physical Exam Patient came with a walker  Hent, nl. Neck, nl. Cv, nl. Lugs, clear. Abdomen nl. Extremities, nl. NEURO no weakness, tinnel sign positive with some thenar weakness. Cervical spine shows DDD at c6-7 . The mri shows stenosis from c4 to c7  Assessment/Plan Patient is to have decompression and fusion from c4 to c7. She is aware of risks and benefits.   Floyce Stakes, MD 03/22/2016, 4:53 PM

## 2016-03-23 ENCOUNTER — Encounter (HOSPITAL_COMMUNITY)
Admission: RE | Disposition: A | Discharge status: Home / Self Care | Payer: Self-pay | Source: Ambulatory Visit | Attending: Neurosurgery

## 2016-03-23 ENCOUNTER — Inpatient Hospital Stay (HOSPITAL_COMMUNITY)
Admission: RE | Admit: 2016-03-23 | Discharge: 2016-03-25 | DRG: 473 | Disposition: A | Discharge status: Home / Self Care | Payer: BLUE CROSS/BLUE SHIELD | Source: Ambulatory Visit | Attending: Neurosurgery | Admitting: Neurosurgery

## 2016-03-23 ENCOUNTER — Inpatient Hospital Stay (HOSPITAL_COMMUNITY): Payer: BLUE CROSS/BLUE SHIELD

## 2016-03-23 ENCOUNTER — Inpatient Hospital Stay (HOSPITAL_COMMUNITY): Payer: BLUE CROSS/BLUE SHIELD | Admitting: Certified Registered"

## 2016-03-23 ENCOUNTER — Encounter (HOSPITAL_COMMUNITY): Payer: Self-pay | Admitting: *Deleted

## 2016-03-23 ENCOUNTER — Inpatient Hospital Stay (HOSPITAL_COMMUNITY): Payer: BLUE CROSS/BLUE SHIELD | Admitting: Emergency Medicine

## 2016-03-23 DIAGNOSIS — M1990 Unspecified osteoarthritis, unspecified site: Secondary | ICD-10-CM | POA: Diagnosis present

## 2016-03-23 DIAGNOSIS — E1142 Type 2 diabetes mellitus with diabetic polyneuropathy: Secondary | ICD-10-CM | POA: Diagnosis present

## 2016-03-23 DIAGNOSIS — H9190 Unspecified hearing loss, unspecified ear: Secondary | ICD-10-CM | POA: Diagnosis present

## 2016-03-23 DIAGNOSIS — M542 Cervicalgia: Secondary | ICD-10-CM | POA: Diagnosis present

## 2016-03-23 DIAGNOSIS — M4802 Spinal stenosis, cervical region: Secondary | ICD-10-CM | POA: Diagnosis present

## 2016-03-23 DIAGNOSIS — Z419 Encounter for procedure for purposes other than remedying health state, unspecified: Secondary | ICD-10-CM

## 2016-03-23 DIAGNOSIS — I10 Essential (primary) hypertension: Secondary | ICD-10-CM | POA: Diagnosis present

## 2016-03-23 DIAGNOSIS — Z79899 Other long term (current) drug therapy: Secondary | ICD-10-CM | POA: Diagnosis not present

## 2016-03-23 DIAGNOSIS — F329 Major depressive disorder, single episode, unspecified: Secondary | ICD-10-CM | POA: Diagnosis present

## 2016-03-23 DIAGNOSIS — G4733 Obstructive sleep apnea (adult) (pediatric): Secondary | ICD-10-CM | POA: Diagnosis present

## 2016-03-23 HISTORY — PX: ANTERIOR CERVICAL DECOMP/DISCECTOMY FUSION: SHX1161

## 2016-03-23 LAB — GLUCOSE, CAPILLARY
GLUCOSE-CAPILLARY: 145 mg/dL — AB (ref 65–99)
GLUCOSE-CAPILLARY: 180 mg/dL — AB (ref 65–99)
Glucose-Capillary: 129 mg/dL — ABNORMAL HIGH (ref 65–99)

## 2016-03-23 SURGERY — ANTERIOR CERVICAL DECOMPRESSION/DISCECTOMY FUSION 3 LEVELS
Anesthesia: General | Site: Spine Cervical

## 2016-03-23 MED ORDER — THROMBIN 5000 UNITS EX SOLR
OROMUCOSAL | Status: DC | PRN
  Administered 2016-03-23: 11:00:00 via TOPICAL

## 2016-03-23 MED ORDER — PROPOFOL 10 MG/ML IV BOLUS
INTRAVENOUS | Status: AC
  Filled 2016-03-23: qty 20

## 2016-03-23 MED ORDER — DEXTROSE 5 % IV SOLN
10.0000 mg | INTRAVENOUS | Status: DC | PRN
  Administered 2016-03-23: 10 ug/min via INTRAVENOUS

## 2016-03-23 MED ORDER — DEXAMETHASONE SODIUM PHOSPHATE 10 MG/ML IJ SOLN
INTRAMUSCULAR | Status: DC | PRN
  Administered 2016-03-23: 10 mg via INTRAVENOUS

## 2016-03-23 MED ORDER — CARBIDOPA-LEVODOPA 25-100 MG PO TABS
1.0000 | ORAL_TABLET | Freq: Every day | ORAL | Status: DC
  Administered 2016-03-23 – 2016-03-25 (×2): 1 via ORAL
  Filled 2016-03-23 (×2): qty 1

## 2016-03-23 MED ORDER — DEXAMETHASONE 4 MG PO TABS
4.0000 mg | ORAL_TABLET | Freq: Four times a day (QID) | ORAL | Status: DC
  Administered 2016-03-23 – 2016-03-25 (×8): 4 mg via ORAL
  Filled 2016-03-23 (×9): qty 1

## 2016-03-23 MED ORDER — MENTHOL 3 MG MT LOZG
1.0000 | LOZENGE | OROMUCOSAL | Status: DC | PRN
  Filled 2016-03-23: qty 9

## 2016-03-23 MED ORDER — ASPIRIN 81 MG PO CHEW
81.0000 mg | CHEWABLE_TABLET | Freq: Every day | ORAL | Status: DC
  Administered 2016-03-23 – 2016-03-25 (×3): 81 mg via ORAL
  Filled 2016-03-23 (×3): qty 1

## 2016-03-23 MED ORDER — PHENOL 1.4 % MT LIQD: 1.0000 | OROMUCOSAL | Status: DC | PRN

## 2016-03-23 MED ORDER — LACTATED RINGERS IV SOLN
INTRAVENOUS | Status: DC
  Administered 2016-03-23: 50 mL/h via INTRAVENOUS
  Administered 2016-03-23 (×3): via INTRAVENOUS

## 2016-03-23 MED ORDER — 0.9 % SODIUM CHLORIDE (POUR BTL) OPTIME
TOPICAL | Status: DC | PRN
  Administered 2016-03-23: 1000 mL

## 2016-03-23 MED ORDER — SODIUM CHLORIDE 0.9% FLUSH
3.0000 mL | Freq: Two times a day (BID) | INTRAVENOUS | Status: DC
Start: 2016-03-23 — End: 2016-03-25
  Administered 2016-03-25: 3 mL via INTRAVENOUS

## 2016-03-23 MED ORDER — OXYCODONE-ACETAMINOPHEN 5-325 MG PO TABS: 1.0000 | ORAL_TABLET | ORAL | Status: DC | PRN

## 2016-03-23 MED ORDER — PROPOFOL 10 MG/ML IV BOLUS
INTRAVENOUS | Status: DC | PRN
  Administered 2016-03-23 (×2): 20 mg via INTRAVENOUS
  Administered 2016-03-23: 10 mg via INTRAVENOUS
  Administered 2016-03-23: 120 mg via INTRAVENOUS
  Administered 2016-03-23: 10 mg via INTRAVENOUS
  Administered 2016-03-23: 20 mg via INTRAVENOUS

## 2016-03-23 MED ORDER — ACETAMINOPHEN 325 MG PO TABS: 325.0000 mg | ORAL_TABLET | ORAL | Status: DC | PRN

## 2016-03-23 MED ORDER — SODIUM CHLORIDE 0.9 % IV SOLN: INTRAVENOUS | Status: DC

## 2016-03-23 MED ORDER — FENTANYL CITRATE (PF) 250 MCG/5ML IJ SOLN
INTRAMUSCULAR | Status: AC
  Filled 2016-03-23: qty 5

## 2016-03-23 MED ORDER — SURGIFOAM 100 EX MISC
CUTANEOUS | Status: DC | PRN
  Administered 2016-03-23: 11:00:00 via TOPICAL

## 2016-03-23 MED ORDER — ONDANSETRON HCL 4 MG/2ML IJ SOLN: 4.0000 mg | INTRAMUSCULAR | Status: DC | PRN

## 2016-03-23 MED ORDER — EPHEDRINE 5 MG/ML INJ
INTRAVENOUS | Status: AC
Start: 2016-03-23 — End: 2016-03-23
  Filled 2016-03-23: qty 10

## 2016-03-23 MED ORDER — ACETAMINOPHEN 650 MG RE SUPP: 650.0000 mg | RECTAL | Status: DC | PRN

## 2016-03-23 MED ORDER — GLYCOPYRROLATE 0.2 MG/ML IJ SOLN
INTRAMUSCULAR | Status: DC | PRN
  Administered 2016-03-23: 0.6 mg via INTRAVENOUS

## 2016-03-23 MED ORDER — LORATADINE 10 MG PO TABS
10.0000 mg | ORAL_TABLET | Freq: Every morning | ORAL | Status: DC
  Administered 2016-03-24 – 2016-03-25 (×2): 10 mg via ORAL
  Filled 2016-03-23 (×2): qty 1

## 2016-03-23 MED ORDER — ROCURONIUM BROMIDE 50 MG/5ML IV SOLN
INTRAVENOUS | Status: AC
  Filled 2016-03-23: qty 1

## 2016-03-23 MED ORDER — DIAZEPAM 5 MG PO TABS: 5.0000 mg | ORAL_TABLET | Freq: Four times a day (QID) | ORAL | Status: DC | PRN

## 2016-03-23 MED ORDER — MIDAZOLAM HCL 2 MG/2ML IJ SOLN
INTRAMUSCULAR | Status: AC
  Filled 2016-03-23: qty 2

## 2016-03-23 MED ORDER — LIDOCAINE HCL (CARDIAC) 20 MG/ML IV SOLN
INTRAVENOUS | Status: DC | PRN
  Administered 2016-03-23: 60 mg via INTRAVENOUS

## 2016-03-23 MED ORDER — FLUTICASONE PROPIONATE 50 MCG/ACT NA SUSP
2.0000 | Freq: Every day | NASAL | Status: DC
  Administered 2016-03-24 – 2016-03-25 (×2): 2 via NASAL
  Filled 2016-03-23: qty 16

## 2016-03-23 MED ORDER — SODIUM CHLORIDE 0.9% FLUSH: 3.0000 mL | INTRAVENOUS | Status: DC | PRN

## 2016-03-23 MED ORDER — FENTANYL CITRATE (PF) 100 MCG/2ML IJ SOLN
INTRAMUSCULAR | Status: DC | PRN
  Administered 2016-03-23 (×2): 50 ug via INTRAVENOUS
  Administered 2016-03-23: 150 ug via INTRAVENOUS
  Administered 2016-03-23: 25 ug via INTRAVENOUS

## 2016-03-23 MED ORDER — SODIUM CHLORIDE 0.9 % IV SOLN: 250.0000 mL | INTRAVENOUS | Status: DC

## 2016-03-23 MED ORDER — CEFAZOLIN SODIUM 1-5 GM-% IV SOLN
1.0000 g | Freq: Three times a day (TID) | INTRAVENOUS | Status: AC
  Administered 2016-03-23 – 2016-03-24 (×2): 1 g via INTRAVENOUS
  Filled 2016-03-23 (×2): qty 50

## 2016-03-23 MED ORDER — SENNA 8.6 MG PO TABS
1.0000 | ORAL_TABLET | Freq: Two times a day (BID) | ORAL | Status: DC
  Administered 2016-03-23 – 2016-03-25 (×4): 8.6 mg via ORAL
  Filled 2016-03-23 (×4): qty 1

## 2016-03-23 MED ORDER — ZOLPIDEM TARTRATE 5 MG PO TABS: 5.0000 mg | ORAL_TABLET | Freq: Every evening | ORAL | Status: DC | PRN

## 2016-03-23 MED ORDER — MONTELUKAST SODIUM 10 MG PO TABS
10.0000 mg | ORAL_TABLET | Freq: Every day | ORAL | Status: DC
  Administered 2016-03-23 – 2016-03-25 (×2): 10 mg via ORAL
  Filled 2016-03-23 (×2): qty 1

## 2016-03-23 MED ORDER — ACETAMINOPHEN 325 MG PO TABS
650.0000 mg | ORAL_TABLET | ORAL | Status: DC | PRN
  Administered 2016-03-23 – 2016-03-25 (×8): 650 mg via ORAL
  Filled 2016-03-23 (×8): qty 2

## 2016-03-23 MED ORDER — PROGESTERONE MICRONIZED 100 MG PO CAPS
100.0000 mg | ORAL_CAPSULE | Freq: Every day | ORAL | Status: DC
  Administered 2016-03-23 – 2016-03-25 (×2): 100 mg via ORAL
  Filled 2016-03-23 (×2): qty 1

## 2016-03-23 MED ORDER — ROCURONIUM BROMIDE 100 MG/10ML IV SOLN
INTRAVENOUS | Status: DC | PRN
  Administered 2016-03-23 (×3): 10 mg via INTRAVENOUS
  Administered 2016-03-23: 50 mg via INTRAVENOUS

## 2016-03-23 MED ORDER — FENOFIBRATE 160 MG PO TABS
160.0000 mg | ORAL_TABLET | Freq: Every morning | ORAL | Status: DC
  Administered 2016-03-24 – 2016-03-25 (×2): 160 mg via ORAL
  Filled 2016-03-23 (×3): qty 1

## 2016-03-23 MED ORDER — DEXAMETHASONE SODIUM PHOSPHATE 4 MG/ML IJ SOLN
4.0000 mg | Freq: Four times a day (QID) | INTRAMUSCULAR | Status: DC
  Administered 2016-03-23: 4 mg via INTRAVENOUS
  Filled 2016-03-23: qty 1

## 2016-03-23 MED ORDER — ONDANSETRON HCL 4 MG/2ML IJ SOLN
INTRAMUSCULAR | Status: DC | PRN
  Administered 2016-03-23: 4 mg via INTRAVENOUS

## 2016-03-23 MED ORDER — CEFAZOLIN SODIUM-DEXTROSE 2-4 GM/100ML-% IV SOLN
2.0000 g | INTRAVENOUS | Status: AC
  Administered 2016-03-23: 2 g via INTRAVENOUS
  Filled 2016-03-23: qty 100

## 2016-03-23 MED ORDER — CARBIDOPA-LEVODOPA 25-100 MG PO TABS
2.0000 | ORAL_TABLET | Freq: Two times a day (BID) | ORAL | Status: DC
  Administered 2016-03-24 – 2016-03-25 (×4): 2 via ORAL
  Filled 2016-03-23 (×4): qty 2

## 2016-03-23 MED ORDER — HYDROMORPHONE HCL 1 MG/ML IJ SOLN: 0.2500 mg | INTRAMUSCULAR | Status: DC | PRN

## 2016-03-23 MED ORDER — PHENYLEPHRINE 40 MCG/ML (10ML) SYRINGE FOR IV PUSH (FOR BLOOD PRESSURE SUPPORT)
PREFILLED_SYRINGE | INTRAVENOUS | Status: AC
  Filled 2016-03-23: qty 10

## 2016-03-23 MED ORDER — OXYCODONE HCL 5 MG PO TABS: 5.0000 mg | ORAL_TABLET | Freq: Once | ORAL | Status: DC | PRN

## 2016-03-23 MED ORDER — NEOSTIGMINE METHYLSULFATE 10 MG/10ML IV SOLN
INTRAVENOUS | Status: DC | PRN
  Administered 2016-03-23: 4 mg via INTRAVENOUS

## 2016-03-23 MED ORDER — ACETAMINOPHEN 160 MG/5ML PO SOLN
325.0000 mg | ORAL | Status: DC | PRN
  Filled 2016-03-23: qty 20.3

## 2016-03-23 MED ORDER — AZELASTINE HCL 0.1 % NA SOLN
1.0000 | Freq: Two times a day (BID) | NASAL | Status: DC
  Administered 2016-03-23 – 2016-03-25 (×4): 1 via NASAL
  Filled 2016-03-23: qty 30

## 2016-03-23 MED ORDER — ROCURONIUM BROMIDE 50 MG/5ML IV SOLN
INTRAVENOUS | Status: AC
Start: 2016-03-23 — End: 2016-03-23
  Filled 2016-03-23: qty 1

## 2016-03-23 MED ORDER — LOSARTAN POTASSIUM 50 MG PO TABS
50.0000 mg | ORAL_TABLET | Freq: Every morning | ORAL | Status: DC
  Administered 2016-03-24 – 2016-03-25 (×2): 50 mg via ORAL
  Filled 2016-03-23 (×2): qty 1

## 2016-03-23 MED ORDER — OXYCODONE HCL 5 MG/5ML PO SOLN: 5.0000 mg | Freq: Once | ORAL | Status: DC | PRN

## 2016-03-23 MED ORDER — EPHEDRINE SULFATE 50 MG/ML IJ SOLN
INTRAMUSCULAR | Status: DC | PRN
  Administered 2016-03-23: 10 mg via INTRAVENOUS

## 2016-03-23 MED ORDER — MIDAZOLAM HCL 5 MG/5ML IJ SOLN
INTRAMUSCULAR | Status: DC | PRN
  Administered 2016-03-23: 2 mg via INTRAVENOUS

## 2016-03-23 MED ORDER — ESCITALOPRAM OXALATE 10 MG PO TABS
10.0000 mg | ORAL_TABLET | Freq: Every morning | ORAL | Status: DC
  Administered 2016-03-24 – 2016-03-25 (×2): 10 mg via ORAL
  Filled 2016-03-23 (×2): qty 1

## 2016-03-23 MED ORDER — MORPHINE SULFATE (PF) 2 MG/ML IV SOLN: 1.0000 mg | INTRAVENOUS | Status: DC | PRN

## 2016-03-23 SURGICAL SUPPLY — 49 items
BENZOIN TINCTURE PRP APPL 2/3 (GAUZE/BANDAGES/DRESSINGS) ×3 IMPLANT
BIT DRILL SPINE QC 12 (BIT) ×3 IMPLANT
BLADE ULTRA TIP 2M (BLADE) ×3 IMPLANT
BNDG GAUZE ELAST 4 BULKY (GAUZE/BANDAGES/DRESSINGS) ×6 IMPLANT
BUR BARREL STRAIGHT FLUTE 4.0 (BURR) IMPLANT
BUR MATCHSTICK NEURO 3.0 LAGG (BURR) ×3 IMPLANT
CANISTER SUCT 3000ML PPV (MISCELLANEOUS) ×3 IMPLANT
CLOSURE WOUND 1/2 X4 (GAUZE/BANDAGES/DRESSINGS) ×1
COVER MAYO STAND STRL (DRAPES) ×3 IMPLANT
DRAPE C-ARM 42X72 X-RAY (DRAPES) ×6 IMPLANT
DRAPE LAPAROTOMY 100X72 PEDS (DRAPES) ×3 IMPLANT
DRAPE MICROSCOPE LEICA (MISCELLANEOUS) ×3 IMPLANT
DRAPE POUCH INSTRU U-SHP 10X18 (DRAPES) ×3 IMPLANT
DRAPE PROXIMA HALF (DRAPES) IMPLANT
DURAPREP 6ML APPLICATOR 50/CS (WOUND CARE) ×3 IMPLANT
ELECT REM PT RETURN 9FT ADLT (ELECTROSURGICAL) ×3
ELECTRODE REM PT RTRN 9FT ADLT (ELECTROSURGICAL) ×1 IMPLANT
GAUZE SPONGE 4X4 12PLY STRL (GAUZE/BANDAGES/DRESSINGS) ×3 IMPLANT
GAUZE SPONGE 4X4 16PLY XRAY LF (GAUZE/BANDAGES/DRESSINGS) IMPLANT
GLOVE BIO SURGEON STRL SZ7 (GLOVE) ×3 IMPLANT
GLOVE BIOGEL M 8.0 STRL (GLOVE) ×6 IMPLANT
GLOVE BIOGEL PI IND STRL 7.5 (GLOVE) ×1 IMPLANT
GLOVE BIOGEL PI INDICATOR 7.5 (GLOVE) ×2
GOWN STRL REUS W/ TWL LRG LVL3 (GOWN DISPOSABLE) ×2 IMPLANT
GOWN STRL REUS W/ TWL XL LVL3 (GOWN DISPOSABLE) ×1 IMPLANT
GOWN STRL REUS W/TWL 2XL LVL3 (GOWN DISPOSABLE) IMPLANT
GOWN STRL REUS W/TWL LRG LVL3 (GOWN DISPOSABLE) ×4
GOWN STRL REUS W/TWL XL LVL3 (GOWN DISPOSABLE) ×2
HALTER HD/CHIN CERV TRACTION D (MISCELLANEOUS) ×3 IMPLANT
HEMOSTAT POWDER KIT SURGIFOAM (HEMOSTASIS) ×3 IMPLANT
KIT BASIN OR (CUSTOM PROCEDURE TRAY) ×3 IMPLANT
KIT ROOM TURNOVER OR (KITS) ×3 IMPLANT
NEEDLE SPNL 22GX3.5 QUINCKE BK (NEEDLE) ×6 IMPLANT
NS IRRIG 1000ML POUR BTL (IV SOLUTION) ×3 IMPLANT
PACK LAMINECTOMY NEURO (CUSTOM PROCEDURE TRAY) ×3 IMPLANT
PATTIES SURGICAL .5 X1 (DISPOSABLE) ×3 IMPLANT
PLATE CERV ANT PROV 3LVL 51MM (Plate) ×2 IMPLANT
RUBBERBAND STERILE (MISCELLANEOUS) ×6 IMPLANT
SCREW SELF TAP VAR 4.2X12PRO (Screw) ×21 IMPLANT
SCREW VAR ANGLE SELF DRILL 14 (Screw) ×3 IMPLANT
SPACER CERVICAL FRGE 12X14X7-7 (Spacer) ×9 IMPLANT
SPONGE INTESTINAL PEANUT (DISPOSABLE) ×6 IMPLANT
SPONGE SURGIFOAM ABS GEL 100 (HEMOSTASIS) ×3 IMPLANT
STRIP CLOSURE SKIN 1/2X4 (GAUZE/BANDAGES/DRESSINGS) ×2 IMPLANT
SUT VIC AB 3-0 SH 8-18 (SUTURE) ×6 IMPLANT
TAPE CLOTH SURG 4X10 WHT LF (GAUZE/BANDAGES/DRESSINGS) ×3 IMPLANT
TOWEL OR 17X24 6PK STRL BLUE (TOWEL DISPOSABLE) ×3 IMPLANT
TOWEL OR 17X26 10 PK STRL BLUE (TOWEL DISPOSABLE) ×3 IMPLANT
WATER STERILE IRR 1000ML POUR (IV SOLUTION) ×3 IMPLANT

## 2016-03-23 NOTE — Progress Notes (Signed)
Patient states she usually wears CPAP at night, refused cpap tonight, placed on 2L O2 nasal canula.

## 2016-03-23 NOTE — Progress Notes (Signed)
Accepted patient to 28C06. Patient is alert and oriented, denies having any pain. Will continue to monitor.

## 2016-03-23 NOTE — Progress Notes (Signed)
Orthopedic Tech Progress Note Patient Details:  [name] [name] 21-Jun-1951 [id number]  Ortho Devices Type of Ortho Device: Soft collar Ortho Device/Splint Interventions: Application   [name] 03/23/2016, 12:53 PM

## 2016-03-23 NOTE — Progress Notes (Signed)
Pts glasses returned. 

## 2016-03-23 NOTE — Transfer of Care (Signed)
Immediate Anesthesia Transfer of Care Note  Patient: [name] [name]  Procedure(s) Performed: Procedure(s) with comments: Cervical Four-Five, Cervical Five-Six, Cervical Six-Seven Anterior cervical decompression/diskectomy/fusion (N/A) - C4-5 C5-6 C6-7 Anterior cervical decompression/diskectomy/fusion  Patient Location: PACU  Anesthesia Type:General  Level of Consciousness: awake, alert , oriented and patient cooperative  Airway & Oxygen Therapy: Patient Spontanous Breathing and Patient connected to face mask oxygen  Post-op Assessment: Report given to RN and Post -op Vital signs reviewed and stable  Post vital signs: Reviewed and stable  Last Vitals:  Filed Vitals:   03/23/16 0624  BP: 138/66  Pulse: 74  Temp: 36.6 C  Resp: 18    Last Pain:  Filed Vitals:   03/23/16 1235  PainSc: 2          Complications: No apparent anesthesia complications

## 2016-03-23 NOTE — Anesthesia Procedure Notes (Signed)
Procedure Name: Intubation Date/Time: 03/23/2016 9:09 AM Performed by: Adalberto Ill Pre-anesthesia Checklist: Patient identified, Emergency Drugs available, Suction available, Patient being monitored and Timeout performed Patient Re-evaluated:Patient Re-evaluated prior to inductionOxygen Delivery Method: Circle system utilized Preoxygenation: Pre-oxygenation with 100% oxygen Intubation Type: IV induction Ventilation: Mask ventilation without difficulty Laryngoscope Size: Mac and 3 Grade View: Grade I Tube type: Oral Tube size: 7.0 mm Number of attempts: 1 Placement Confirmation: ETT inserted through vocal cords under direct vision,  positive ETCO2,  CO2 detector and breath sounds checked- equal and bilateral Secured at: 23 cm Tube secured with: Tape Dental Injury: Teeth and Oropharynx as per pre-operative assessment

## 2016-03-23 NOTE — Anesthesia Preprocedure Evaluation (Signed)
Anesthesia Evaluation  Patient identified by MRN, date of birth, ID band Patient awake    Reviewed: Allergy & Precautions, NPO status , Patient's Chart, lab work & pertinent test results  History of Anesthesia Complications Negative for: history of anesthetic complications  Airway Mallampati: II  TM Distance: >3 FB Neck ROM: Full    Dental no notable dental hx. (+) Dental Advisory Given, Poor Dentition   Pulmonary shortness of breath, sleep apnea and Continuous Positive Airway Pressure Ventilation ,    Pulmonary exam normal breath sounds clear to auscultation       Cardiovascular hypertension, Pt. on medications (-) angina(-) Past MI and (-) CHF Normal cardiovascular exam Rhythm:Regular Rate:Normal     Neuro/Psych PSYCHIATRIC DISORDERS Anxiety Depression  Neuromuscular disease negative neurological ROS     GI/Hepatic negative GI ROS, Neg liver ROS,   Endo/Other  diabetes, Type 2, Oral Hypoglycemic AgentsMorbid obesity  Renal/GU negative Renal ROS  negative genitourinary   Musculoskeletal  (+) Arthritis , Osteoarthritis,    Abdominal   Peds negative pediatric ROS (+)  Hematology negative hematology ROS (+)   Anesthesia Other Findings   Reproductive/Obstetrics negative OB ROS                             Anesthesia Physical Anesthesia Plan  ASA: III  Anesthesia Plan: General   Post-op Pain Management:    Induction: Intravenous  Airway Management Planned: Oral ETT  Additional Equipment: None  Intra-op Plan:   Post-operative Plan: Extubation in OR  Informed Consent: I have reviewed the patients History and Physical, chart, labs and discussed the procedure including the risks, benefits and alternatives for the proposed anesthesia with the patient or authorized representative who has indicated his/her understanding and acceptance.   Dental advisory given  Plan Discussed with:  CRNA and Surgeon  Anesthesia Plan Comments:         Anesthesia Quick Evaluation

## 2016-03-24 NOTE — Progress Notes (Signed)
Physical Therapy Evaluation Patient Details Name: [name] [name] MRN: [id number] DOB: 06-09-1951 Today's Date: 03/24/2016   History of Present Illness  65 y.o. s/p  ACDF C4-7. PMH includes HTN, depression, HLD, polyneuropathy, arthritis, SOB-dyspnea, multiple system atrophy, DM, HOH.   Clinical Impression  Patient presents with pelvic girdle weakness and decreased balance, uses rollator walker for balance, but with fall history.  Pain well controlled, mobility with MIN assist overall, see note below.  Patient will benefit from continued PT services, in hospital for now, with Home Health services after discharge.  Anticipate rapid recovery.    Follow Up Recommendations Home health PT    Equipment Recommendations  3in1 (PT)    Recommendations for Other Services       Precautions / Restrictions Precautions Precautions: Cervical;Fall Required Braces or Orthoses: Cervical Brace Cervical Brace: Soft collar Restrictions Weight Bearing Restrictions: No      Mobility  Bed Mobility Overal bed mobility: Needs Assistance Bed Mobility: Rolling;Sidelying to Sit;Sit to Sidelying Rolling: Min assist Sidelying to sit: Min assist     Sit to sidelying: Mod assist General bed mobility comments: cues for technique. MIN assist to sit, MOD assist for good technique LE during supine.  Transfers Overall transfer level: Needs assistance Equipment used: 4-wheeled walker Transfers: Sit to/from Stand Sit to Stand: Min guard         General transfer comment: Cues for hand placement during transition.  Ambulation/Gait Ambulation/Gait assistance: Min guard;Supervision Ambulation Distance (Feet): 70 Feet Assistive device: 4-wheeled walker Gait Pattern/deviations: Shuffle   Gait velocity interpretation: Below normal speed for age/gender General Gait Details: Patient reports shuffling gait pattern is long term issue.  Stairs            Wheelchair Mobility    Modified Rankin  (Stroke Patients Only)       Balance Overall balance assessment: History of Falls;Needs assistance   Sitting balance-Leahy Scale: Good       Standing balance-Leahy Scale: Fair Standing balance comment: Decreased stability with eyes closed unsupported.                             Pertinent Vitals/Pain Pain Assessment: 0-10 Pain Score: 2  Pain Location: neck Pain Intervention(s): Monitored during session    Home Living Family/patient expects to be discharged to:: Private residence Living Arrangements: Alone Available Help at Discharge: Other (Comment);Available PRN/intermittently (aide comes 2x per week) Type of Home: Other(Comment) (condo) Home Access: Ramped entrance     Home Layout: One level Home Equipment: Shower seat - built in;Walker - 4 wheels;Grab bars - tub/shower (reports built-in seat is small) Additional Comments: someone coming to stay with pt until 9:00pm    Prior Function Level of Independence: Independent with assistive device(s)         Comments: uses RW for balance.     Hand Dominance   Dominant Hand: Left    Extremity/Trunk Assessment   Upper Extremity Assessment: Defer to OT evaluation           Lower Extremity Assessment: Overall WFL for tasks assessed (Pelvic girdle weakness (mild))         Communication   Communication: No difficulties  Cognition Arousal/Alertness: Awake/alert Behavior During Therapy: WFL for tasks assessed/performed Overall Cognitive Status: Within Functional Limits for tasks assessed                      General Comments      Exercises  Assessment/Plan    PT Assessment Patient needs continued PT services  PT Diagnosis Abnormality of gait;Generalized weakness;Acute pain   PT Problem List Decreased strength;Decreased activity tolerance;Decreased balance;Decreased mobility;Decreased knowledge of precautions;Pain  PT Treatment Interventions Gait training;Functional mobility  training;Therapeutic activities;Therapeutic exercise;Balance training;Patient/family education   PT Goals (Current goals can be found in the Care Plan section) Acute Rehab PT Goals Patient Stated Goal: to not fall PT Goal Formulation: With patient Time For Goal Achievement: 04/07/16 Potential to Achieve Goals: Good    Frequency Min 5X/week   Barriers to discharge Decreased caregiver support Patient reports she will have assist during day at home.    Co-evaluation               End of Session Equipment Utilized During Treatment: Gait belt;Cervical collar Activity Tolerance: Patient tolerated treatment well Patient left: in bed;with call bell/phone within reach;with bed alarm set Nurse Communication: Mobility status;Precautions         Time: [id number] PT Time Calculation (min) (ACUTE ONLY): 32 min   Charges:   PT Evaluation $PT Eval Low Complexity: 1 Procedure PT Treatments $Therapeutic Activity: 8-22 mins   PT G Codes:        Briceson Broadwater L 04/04/2016, 2:09 PM

## 2016-03-24 NOTE — Op Note (Signed)
[name] [name], [name] [name]             ACCOUNT NO.:  [id number]  MEDICAL RECORD NO.:  [id number]  LOCATION:  5C06C                        FACILITY:  [facility]  PHYSICIAN:  Leeroy Cha, M.D.   DATE OF BIRTH:  1950/11/13  DATE OF PROCEDURE:  03/23/2016 DATE OF DISCHARGE:                              OPERATIVE REPORT   PREOPERATIVE DIAGNOSES:  Cervical stenosis, C4-5, C5-6, C6-7.  History of multiple systemic cerebral atrophy.  POSTOPERATIVE DIAGNOSES:  Cervical stenosis, C4-5, C5-6, C6-7.  History of multiple systemic cerebral atrophy.  PROCEDURE:  Anterior cervical C4-5, C5-6, C6-7 discectomy; decompression of spinal cord, foraminotomy, interbody fusion with graft and plate, microscope.  SURGEON:  Leeroy Cha, M.D.  CLINICAL HISTORY:  The patient was in my office with a complex neurological history.  She has some type of multiple cerebral atrophy, keep falling to the point that MRI of the cervical spine was done which showed stenosis.  The main concern was that probably this lady with next fall she might have hyperextension injury and damage in the spinal cord. We talked about surgery.  The neurologist agree with surgery.  The patient knew the risk and the benefit and that the surgery itself will not correct her main problem.  By x-ray, she has a large jugular vein in the right side, so we decided to go ahead from the left side.  DESCRIPTION OF PROCEDURE:  After the patient was taken to the OR, and after intubation, the left side of the neck was prepped with a DuraPrep. Drapes were applied.  Transverse incision was made through the skin, subcutaneous tissue, platysma, straight down to the cervical spine.  X- rays showed that indeed we were right at the L4-5.  Thompson retractor was inserted and we started working first at the level of L4-5 with opening of the calcified anterior ligament and going our way posteriorly to the posterior ligament with decompression of the spinal cord  midline and laterally into the foramen.  The same procedure was done at L5-6 with decompression of the cord and the C6 nerve root.  The most difficult part was at the level C6-7 which she  had abnormal space. From the beginning we had to drill all our way to the posterior ligament with removal of calcified ligament and decompression of the cord as well as the C7 nerve root.  Having done this, the area was irrigated.  We introduced 3 allograft, lordotic with 7 mm height, followed by a plate using 8 screws.  Lateral cervical spine x-ray showed good position of the upper part of the plate.  Because of the shoulder we were unable to see the lower part.  The area was irrigated.  Hemostasis was accomplished, because she had been taking aspirin for many years we decided to leave a small drain at least overnight.  The patient woke up, and she is going to be transferred to PACU.          ______________________________ Leeroy Cha, M.D.     EB/MEDQ  D:  03/23/2016  T:  03/24/2016  Job:  [id number]

## 2016-03-24 NOTE — Progress Notes (Signed)
Subjective: Patient reports Doing well postop however still has not mobilized no significant swallowing difficulty  Objective: Vital signs in last 24 hours: Temp:  [97.4 F (36.3 C)-98.6 F (37 C)] 98.2 F (36.8 C) (05/13 0518) Pulse Rate:  [81-93] 81 (05/13 0518) Resp:  [18-33] 20 (05/13 0518) BP: (118-156)/(52-75) 125/70 mmHg (05/13 0518) SpO2:  [89 %-98 %] 98 % (05/13 0518)  Intake/Output from previous day: 05/12 0701 - 05/13 0700 In: 2040 [P.O.:240; I.V.:1800] Out: 364 [Urine:4; Drains:60; Blood:300] Intake/Output this shift:    Strength out of 5 wound clean dry and intact  Lab Results: No results for input(s): WBC, HGB, HCT, PLT in the last 72 hours. BMET No results for input(s): NA, K, CL, CO2, GLUCOSE, BUN, CREATININE, CALCIUM in the last 72 hours.  Studies/Results: Dg Cervical Spine 2-3 Views  03/23/2016  CLINICAL DATA:  Intraoperative images following C4-5, C5-6, and C6-7 ACDF. EXAM: CERVICAL SPINE - 2-3 VIEW COMPARISON:  Cervical spine MRI of January 16, 2016 and cervical spine series of December 22, 2015. FINDINGS: The image labeled #1 reveals the trachea to be intubated. A metallic needle like localization device lies along the midportion of the superior endplate of C5. The image labeled #2 reveals obscuration of much of the cervical spine due to surgical devices. There is partial visualization of an anterior plate and compression screws from C4 inferiorly. The image labeled #3 reveals the anterior fusion plate with compression screws from C4 through C6. IMPRESSION: Intraoperative lateral x-ray series reveal anterior fusion from C4 through C6. No immediate postprocedure complication is observed. Electronically Signed   By: David  Martinique M.D.   On: 03/23/2016 12:23    Assessment/Plan: Mobilized today with physical and occupational therapy continue her J-P drain throughout most of the day possible DC tomorrow  LOS: 1 day     Amarylis Rovito P 03/24/2016, 9:11 AM

## 2016-03-24 NOTE — Evaluation (Signed)
Occupational Therapy Evaluation Patient Details Name: [name] [name] MRN: [id number] DOB: 1951-01-29 Today's Date: 03/24/2016    History of Present Illness 65 y.o. s/p  ACDF. PMH includes HTN, depression, HLD, polyneuropathy, arthritis, SOB-dyspnea, multiple system atrophy, DM, HOH.    Clinical Impression   Pt s/p above. Pt independent with ADLs, PTA, but has had falls. Feel pt will benefit from acute OT to increase independence prior to d/c. Recommending HHOT upon d/c.     Follow Up Recommendations  Home health OT;Supervision - Intermittent    Equipment Recommendations  Tub/shower seat    Recommendations for Other Services       Precautions / Restrictions Precautions Precautions: Cervical;Fall Required Braces or Orthoses: Cervical Brace Cervical Brace: Soft collar Restrictions Weight Bearing Restrictions: No      Mobility Bed Mobility Overal bed mobility: Needs Assistance Bed Mobility: Rolling;Sidelying to Sit Rolling: Min assist Sidelying to sit: Min assist       General bed mobility comments: cues for technique. assist given to come to sitting.  Transfers Overall transfer level: Needs assistance   Transfers: Sit to/from Stand Sit to Stand: Min guard         General transfer comment: Rollator in front of pt upon standing.     Balance Overall balance assessment: History of Falls            Min guard ambulating with rollator; Hand held assist given to take some steps without rollator.                              ADL Overall ADL's : Needs assistance/impaired                 Upper Body Dressing : Sitting;Set up;Supervision/safety   Lower Body Dressing: Sit to/from stand;Min guard   Toilet Transfer: Min guard;Ambulation (rollator; sit to stand from bed)           Functional mobility during ADLs:  (Min guard with rollator; Min A taking steps without rollator) General ADL Comments: OT adjusted cervical collar.      Vision     Perception     Praxis      Pertinent Vitals/Pain Pain Assessment: 0-10 Pain Score: 1  Pain Location: neck Pain Descriptors / Indicators: Throbbing Pain Intervention(s): Monitored during session;Repositioned     Hand Dominance Left   Extremity/Trunk Assessment Upper Extremity Assessment Upper Extremity Assessment: RUE deficits/detail (can perform bilateral shoulder flexion to at least 90 degree) RUE Coordination: decreased fine motor (reports this is baseline)   Lower Extremity Assessment Lower Extremity Assessment: Defer to PT evaluation       Communication Communication Communication: No difficulties   Cognition Arousal/Alertness: Awake/alert Behavior During Therapy: WFL for tasks assessed/performed Overall Cognitive Status: Within Functional Limits for tasks assessed                     General Comments       Exercises Exercises: Other exercises Other Exercises Other Exercises: educated on activities she can be doing with Rt hand to work on coordination   Shoulder Instructions      Home Living Family/patient expects to be discharged to:: Private residence Living Arrangements: Alone Available Help at Discharge: Other (Comment);Available PRN/intermittently (aide comes 2x per week) Type of Home: Other(Comment) (condo) Home Access: Ramped entrance     Home Layout: One level     Bathroom Shower/Tub: Occupational psychologist:  (elevated  toilet-sink close)     Home Equipment: Shower seat - built in;Walker - 4 wheels;Grab bars - tub/shower (reports built-in seat is small)   Additional Comments: someone coming to stay with pt until 9:00pm      Prior Functioning/Environment Level of Independence: Needs assistance    ADL's / Homemaking Assistance Needed: assist with cleaning        OT Diagnosis: Acute pain   OT Problem List: Decreased range of motion;Pain;Obesity;Decreased coordination;Decreased knowledge of  precautions;Decreased knowledge of use of DME or AE;Decreased activity tolerance;Impaired balance (sitting and/or standing)   OT Treatment/Interventions: Self-care/ADL training;DME and/or AE instruction;Therapeutic activities;Patient/family education;Balance training;Therapeutic exercise    OT Goals(Current goals can be found in the care plan section) Acute Rehab OT Goals Patient Stated Goal: to not fall OT Goal Formulation: With patient Time For Goal Achievement: 03/31/16 Potential to Achieve Goals: Good ADL Goals Pt Will Perform Grooming: with set-up;standing Pt Will Perform Lower Body Bathing: sit to/from stand;with set-up;with supervision Pt Will Perform Lower Body Dressing: with set-up;with supervision;sit to/from stand Pt Will Transfer to Toilet: with supervision;ambulating;grab bars (elevated toilet; using rollator) Pt Will Perform Toileting - Clothing Manipulation and hygiene: with supervision;sit to/from stand Additional ADL Goal #1: Pt will independently perform HEP for Rt hand to increase coordination.  OT Frequency: Min 2X/week   Barriers to D/C:            Co-evaluation              End of Session Equipment Utilized During Treatment: Gait belt;Other (comment);Cervical collar (rollator)  Activity Tolerance: Patient tolerated treatment well Patient left: in chair;with call bell/phone within reach   Time: 0900-0921 OT Time Calculation (min): 21 min Charges:  OT General Charges $OT Visit: 1 Procedure OT Evaluation $OT Eval Moderate Complexity: 1 Procedure G-Codes:    [name] [name] Apr 18, 2016, 10:24 AM

## 2016-03-25 MED ORDER — OXYCODONE-ACETAMINOPHEN 5-325 MG PO TABS: 1.0000 | ORAL_TABLET | ORAL | Status: DC | PRN

## 2016-03-25 NOTE — Progress Notes (Signed)
Occupational Therapy Treatment Patient Details Name: [name] [name] MRN: [id number] DOB: 19-Jan-1951 Today's Date: 03/25/2016    History of present illness 65 y.o. s/p  ACDF C4-7. PMH includes HTN, depression, HLD, polyneuropathy, arthritis, SOB-dyspnea, multiple system atrophy, DM, HOH.    OT comments  Pt progressing. Continue to recommend [facility] upon d/c. Feel pt will continue to benefit from acute OT to increase independence and reinforce cervical precautions prior to d/c.  Follow Up Recommendations  Home health OT;Supervision - Intermittent    Equipment Recommendations  Tub/shower seat    Recommendations for Other Services      Precautions / Restrictions Precautions Precautions: Cervical;Fall Precaution Comments: cues to try to keep neck straight Required Braces or Orthoses: Cervical Brace Cervical Brace: Soft collar Restrictions Weight Bearing Restrictions: No       Mobility Bed Mobility     General bed mobility comments: not assessed  Transfers Overall transfer level: Needs assistance Transfers: Sit to/from Stand Sit to Stand: Supervision (and set up for rollator)         General transfer comment: rollator in front of pt when standing from recliner chair.   Balance                     ADL Overall ADL's : Needs assistance/impaired     Grooming: Wash/dry face;Applying deodorant;Standing;Set up;Supervision/safety   Upper Body Bathing: Set up;Supervision/ safety;Standing Upper Body Bathing Details (indicate cue type and reason): OT washed pt's back Lower Body Bathing: Min guard (standing and sitting) Lower Body Bathing Details (indicate cue type and reason): washed peri area and tops of legs Upper Body Dressing : Minimal assistance;Standing   Lower Body Dressing: Min guard (sitting and standing) Lower Body Dressing Details (indicate cue type and reason): doffed mesh panties Toilet Transfer: Min guard;Ambulation (rollator; sit to stand from  rollator and chair)           Functional mobility during ADLs: Min guard (rollator; sit to stand-supervision and set up) General ADL Comments: Instructed to use left hand for items on left side of sink and right hand for items on right side of sink.      Vision                     Perception     Praxis      Cognition  Awake/Alert Behavior During Therapy: WFL for tasks assessed/performed Overall Cognitive Status: Within Functional Limits for tasks assessed (cues given to try to keep neck straight)                       Extremity/Trunk Assessment               Exercises Other Exercises Other Exercises: educated on fine motor activities/exercise for Rt hand and gave pt handout.   Shoulder Instructions       General Comments      Pertinent Vitals/ Pain       Pain Assessment: 0-10 Pain Score: 2  Pain Location: neck Pain Descriptors / Indicators: Aching Pain Intervention(s): Monitored during session  Home Living                                          Prior Functioning/Environment              Frequency Min 2X/week     Progress Toward  Goals  OT Goals(current goals can now be found in the care plan section)  Progress towards OT goals: Progressing toward goals  Acute Rehab OT Goals Patient Stated Goal: to not fall OT Goal Formulation: With patient Time For Goal Achievement: 03/31/16 Potential to Achieve Goals: Good ADL Goals Pt Will Perform Grooming: with set-up;standing Pt Will Perform Lower Body Bathing: sit to/from stand;with set-up;with supervision Pt Will Perform Lower Body Dressing: with set-up;with supervision;sit to/from stand Pt Will Transfer to Toilet: with supervision;ambulating;grab bars (elevated toilet; using rollator) Pt Will Perform Toileting - Clothing Manipulation and hygiene: with supervision;sit to/from stand Additional ADL Goal #1: Pt will independently perform HEP for Rt hand to increase  coordination.  Plan Discharge plan remains appropriate    Co-evaluation                 End of Session Equipment Utilized During Treatment: Other (comment);Cervical collar (rollator)   Activity Tolerance Patient tolerated treatment well   Patient Left in chair;with call bell/phone within reach;Other (comment) (with MD)   Nurse Communication Other (comment) (washed off most of body)        Time: [id number] OT Time Calculation (min): 21 min  Charges: OT General Charges $OT Visit: 1 Procedure OT Treatments $Self Care/Home Management : 8-22 mins  [name] OTR/L [id number] 03/25/2016, 12:16 PM

## 2016-03-25 NOTE — Progress Notes (Signed)
Discharge reviewed with patient, no further questions. Patient is transported to family vehicle by staff.

## 2016-03-25 NOTE — Progress Notes (Signed)
No acute events Doing well Ready to go home Incision looks good Good strength throughout D/c drain D/c home

## 2016-03-25 NOTE — Progress Notes (Signed)
Physical Therapy Treatment Patient Details Name: [name] [name] MRN: [id number] DOB: August 16, 1951 Today's Date: 03/25/2016    History of Present Illness 65 y.o. s/p  ACDF C4-7. PMH includes HTN, depression, HLD, polyneuropathy, arthritis, SOB-dyspnea, multiple system atrophy, DM, HOH.     PT Comments    Patient progressing well towards PT goals. Performed bed mobility, transfers and gait with Min guard assist-supervision for safety. Reviewed cervical precautions and safety at home. Discussed positioning with pillows for comfort. Pt wondering how long she has to wear cervical collar. Deferred to MD. Will follow acutely.   Follow Up Recommendations  Home health PT     Equipment Recommendations  3in1 (PT)    Recommendations for Other Services       Precautions / Restrictions Precautions Precautions: Cervical;Fall Required Braces or Orthoses: Cervical Brace Cervical Brace: Soft collar Restrictions Weight Bearing Restrictions: No    Mobility  Bed Mobility Overal bed mobility: Needs Assistance Bed Mobility: Rolling;Sidelying to Sit Rolling: Min guard Sidelying to sit: Min guard       General bed mobility comments: Good demo of log roll technique. No assist needed but increased time and cues.   Transfers Overall transfer level: Needs assistance Equipment used: 4-wheeled walker Transfers: Sit to/from Stand Sit to Stand: Min guard         General transfer comment: Min guard to boost from EOB with cues for hand placement/technique. Transferred to chair post ambulation bout.  Ambulation/Gait Ambulation/Gait assistance: Supervision Ambulation Distance (Feet): 200 Feet Assistive device: 4-wheeled walker Gait Pattern/deviations: Step-through pattern;Decreased stride length;Shuffle   Gait velocity interpretation: Below normal speed for age/gender General Gait Details: Slow, shuffling gait with cues for RW proximity. Shuffling in baseline issue.   Stairs            Wheelchair Mobility    Modified Rankin (Stroke Patients Only)       Balance Overall balance assessment: Needs assistance Sitting-balance support: Feet supported;No upper extremity supported Sitting balance-Leahy Scale: Good Sitting balance - Comments: Able to adjust socks in bed without breaking cervical precautions   Standing balance support: During functional activity Standing balance-Leahy Scale: Fair                      Cognition Arousal/Alertness: Awake/alert Behavior During Therapy: WFL for tasks assessed/performed Overall Cognitive Status: Within Functional Limits for tasks assessed                      Exercises      General Comments General comments (skin integrity, edema, etc.): Discussed avoiding walking dog and driving until cleared by MD.       Pertinent Vitals/Pain Pain Assessment: 0-10 Pain Score: 1  Pain Location: neck Pain Descriptors / Indicators: Sore Pain Intervention(s): Monitored during session;Repositioned    Home Living                      Prior Function            PT Goals (current goals can now be found in the care plan section) Progress towards PT goals: Progressing toward goals    Frequency  Min 5X/week    PT Plan Current plan remains appropriate    Co-evaluation             End of Session Equipment Utilized During Treatment: Gait belt;Cervical collar Activity Tolerance: Patient tolerated treatment well Patient left: in chair;with call bell/phone within reach     Time: [phone]  PT Time Calculation (min) (ACUTE ONLY): 20 min  Charges:  $Gait Training: 8-22 mins                    G Codes:      Luvinia Lucy A Jatia Musa 03/25/2016, 8:58 AM Wray Kearns, PT, DPT [phone]

## 2016-03-25 NOTE — Discharge Summary (Signed)
Date of Admission: 03/23/16  Date of Discharge: 03/25/16  PREOPERATIVE DIAGNOSES: Cervical stenosis, C4-5, C5-6, C6-7. History of multiple systemic cerebral atrophy.  POSTOPERATIVE DIAGNOSES: Cervical stenosis, C4-5, C5-6, C6-7. History of multiple systemic cerebral atrophy.  PROCEDURE: Anterior cervical C4-5, C5-6, C6-7 discectomy; decompression of spinal cord, foraminotomy, interbody fusion with graft and plate, microscope.  Attending: [facility] Course:  This patient was admitted for the above operation.  She tolerated it well and had an uneventful post-operative course.  She is discharged home in stable condition.  Discharged Medications: Resume prior meds, percocet for pain  Follow up: Botero in 3 weeks

## 2016-03-25 NOTE — Progress Notes (Signed)
If Ms Edmond does not go home today, she would like an order for melatonin and natural tears.

## 2016-03-26 ENCOUNTER — Encounter (HOSPITAL_COMMUNITY): Payer: Self-pay | Admitting: Neurosurgery

## 2016-03-27 ENCOUNTER — Ambulatory Visit: Payer: BLUE CROSS/BLUE SHIELD | Admitting: Neurology

## 2016-03-27 NOTE — Anesthesia Postprocedure Evaluation (Signed)
Anesthesia Post Note  Patient: [name] [name]  Procedure(s) Performed: Procedure(s) (LRB): Cervical Four-Five, Cervical Five-Six, Cervical Six-Seven Anterior cervical decompression/diskectomy/fusion (N/A)  Patient location during evaluation: PACU Anesthesia Type: General Level of consciousness: awake Pain management: pain level controlled Vital Signs Assessment: post-procedure vital signs reviewed and stable Respiratory status: spontaneous breathing Cardiovascular status: stable Postop Assessment: no signs of nausea or vomiting Anesthetic complications: no    Last Vitals:  Filed Vitals:   03/25/16 0950 03/25/16 1421  BP: 137/62 120/60  Pulse: 79 80  Temp: 36.9 C 36.7 C  Resp: 18 18    Last Pain:  Filed Vitals:   03/25/16 1522  PainSc: 0-No pain                 Kaimana Neuzil

## 2016-03-28 ENCOUNTER — Other Ambulatory Visit: Payer: Self-pay | Admitting: Neurology

## 2016-03-28 ENCOUNTER — Encounter (HOSPITAL_COMMUNITY): Payer: Self-pay | Admitting: Neurosurgery

## 2016-03-28 MED ORDER — CARBIDOPA-LEVODOPA 25-100 MG PO TABS: 1.0000 | ORAL_TABLET | ORAL | Status: DC

## 2016-03-28 NOTE — Telephone Encounter (Signed)
Carbidopa Levodopa refill requested. Per last office note- patient to remain on medication. Refill approved and sent to patient's pharmacy.   

## 2016-03-29 ENCOUNTER — Other Ambulatory Visit: Payer: Self-pay | Admitting: Neurology

## 2016-04-02 ENCOUNTER — Other Ambulatory Visit: Payer: Self-pay | Admitting: Neurology

## 2016-04-02 NOTE — Telephone Encounter (Signed)
Carbidopa Levodopa refill requested. Per last office note- patient to remain on medication. Refill approved and sent to patient's pharmacy.   

## 2016-04-04 ENCOUNTER — Encounter (HOSPITAL_COMMUNITY): Payer: Self-pay | Admitting: Neurosurgery

## 2016-04-12 ENCOUNTER — Encounter (HOSPITAL_COMMUNITY): Payer: Self-pay | Admitting: Neurosurgery

## 2016-05-07 ENCOUNTER — Other Ambulatory Visit: Payer: Self-pay | Admitting: Neurology

## 2016-05-07 MED ORDER — CARBIDOPA-LEVODOPA 25-100 MG PO TABS: ORAL_TABLET | ORAL | Status: DC

## 2016-05-07 NOTE — Telephone Encounter (Signed)
Carbidopa Levodopa refill requested. Per last office note- patient to remain on medication. Refill approved and sent to patient's pharmacy.   

## 2016-05-14 ENCOUNTER — Other Ambulatory Visit: Payer: Self-pay | Admitting: Neurology

## 2016-05-14 MED ORDER — CARBIDOPA-LEVODOPA 25-100 MG PO TABS: ORAL_TABLET | ORAL | Status: DC

## 2016-05-14 NOTE — Telephone Encounter (Signed)
Carbidopa Levodopa refill requested. Per last office note- patient to remain on medication. Refill approved and sent to patient's pharmacy.   

## 2016-05-31 ENCOUNTER — Ambulatory Visit: Payer: BLUE CROSS/BLUE SHIELD | Admitting: Diagnostic Neuroimaging

## 2016-06-14 NOTE — Progress Notes (Signed)
  [name] [name] was seen today in the movement disorders clinic for neurologic consultation at the request of DEWEY,Alaynah, MD.    The patient presents for a second opinion regarding falls and balance difficulties.  She has been seeing Dr. Penumalli since January, 2015 and most recently saw him on 11/25/2015 and saw his nurse practitioner on 12/14/2015.  I have taken a detailed review of the records from [facility].  Her initial complaint in January, 2015 was of dizziness and balance change.  By November, 2015, the patient's balance change and falls had become such a problem that Dr. Penumalli told the patient that he thought it was unsafe for her to live alone.  He noted that the patient was very short stepped in her gait and she was off balance and antalgic.  Over the course of time, she continued to have repetitive falls.  As of August, 2016 the patient began to complain of drooling.  In November, 2016, the patient began to complain of tremor when holding a cup, which she states has increased.  01/26/16 update:  The patient presents today for follow-up.  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  She has continued to have falls and fell on February 23 ended up in the emergency room.  They did not do a CT of the brain.  Fortunately, she did not lose consciousness with that fall.  She did fall 3 times the day after that but none since.  She has had some dizziness with the medication.  She did have labwork at our last visit.  Her zinc was normal.  Vitamin D was normal.  Celiac panel was normal.  RPR was negative.  Vitamin B12 was slightly low at 313 and I asked her to start a B12 supplement.  Folate was normal.  The patient wanted to follow-up here a little bit earlier.  States that the diplopia is getting worse.  She had an MRI of the cervical spine on 01/16/16 that I reviewed.  There is degenerative changes and significant NFS and C5  on the L, bilaterally at C6 and 7.  03/13/16 update:  The patient presents today for follow-up.  She is accompanied by her in-home caregiver who supplements the history.  Her caregiver now comes a few hours to days a week.  I have reviewed records since last visit.  The patient saw Dr. Botero on 02/06/2016 and his notes stated that there was no doubt that the patient had cervical stenosis but he was not sure how much the surgery would help.  He told the patient to follow back up with me and my recommendations.  I subsequently got a call from the patient's primary care physician and she asked me the same question and I stated that while the surgery may help her pain, it was not going to help her falls, shuffling, or diplopia and the anesthesia could actually set the MSA back.  Last visit, I did increase her carbidopa/levodopa 25/100-2 tablets 3 times per day.  I offered to change her to the extended release to see if that would help the dizziness but she did not want to do that.  She eventually called me and stated that she was dizzy and stated that her primary care physician said that she may need discontinue the medication.  I asked her if she was sure that it was from the medication because dizziness was one of her presenting complaints   at onset in 2015.  I did tell her that she could hold the medication and see if the dizziness changed.  The patient states that she d/c the medication.  She states that she has fallen 4 times, 2 prior to the d/c of levodopa and 2 after.  She really noticed no change in the dizziness and in fact states that she is more dizzy now.  The patient did see Dr. [name] on April 20 in follow-up for her sleep apnea.  She states that she does not have to see Dr. [name] for another year and it was just recommended that she use her CPAP more and try melatonin.  06/15/16 update:  The patient presents today for follow-up.  This patient is accompanied in the office by her caregiver who supplements  the history.  She has a caregiver 2 days a week.   I have reviewed multiple records made available to me.  The patient had anterior cervical discectomy at the C4-C5, C5-C6, and C6-C7 levels on May 12.  She was discharged from the hospital on May 14.  She had no surgical complications.  She is on carbidopa/levodopa 25/100, 2 tablets in the morning, 2 in the afternoon and one in the evening.  She has had falls since surgery, the last of which was 7/26.  She was bent over to get dish washing detergent and fell on her knees.  She also fell on 7/5 and she tried to get up from the chair and her foot got caught.  She fell on 6/15 and she bruised her arm.  With the falls, the walker was present but she wasn't necessarily leaning on it at the time.  She is in PT right now 2-3 days a week.    Neuroimaging has previously been performed.  It is available for my review today.  She had an MRI brain on 07/15/15 that was unremarkable   ALLERGIES:   Allergies  Allergen Reactions  . Aleve [Naproxen] Hives, Shortness Of Breath and Rash    Pt Avoids All Nsaids  . Zocor [Simvastatin] Other (See Comments)    "weird feeling all over body"    CURRENT MEDICATIONS:  Outpatient Encounter Prescriptions as of 06/15/2016  Medication Sig  . acetaminophen (TYLENOL) 650 MG CR tablet Take 650-1,300 mg by mouth every 8 (eight) hours as needed for pain.   Marland Kitchen aspirin 81 MG tablet Take 81 mg by mouth daily.  Marland Kitchen azelastine (ASTELIN) 0.1 % nasal spray Place 1 spray into both nostrils 2 (two) times daily.   . carbidopa-levodopa (SINEMET IR) 25-100 MG tablet Take 2 tablets by mouth 3 (three) times daily.  . carbidopa-levodopa (SINEMET IR) 25-100 MG tablet 2 tablets in the morning. 2 tablets at lunch. 1 tablet at bedtime.  . Cholecalciferol (VITAMIN D PO) Take 5,000 Units by mouth daily.  Marland Kitchen docusate sodium (COLACE) 100 MG capsule Take 100 mg by mouth daily as needed for mild constipation.   Marland Kitchen escitalopram (LEXAPRO) 20 MG tablet Take 10 mg  by mouth every morning.   . fenofibrate 160 MG tablet Take 160 mg by mouth every morning.   . fluticasone (FLONASE) 50 MCG/ACT nasal spray Place 2 sprays into both nostrils daily.   Marland Kitchen loratadine (CLARITIN) 10 MG tablet Take 10 mg by mouth every morning.  Marland Kitchen losartan (COZAAR) 100 MG tablet Take 50 mg by mouth every morning.  . Melatonin 5 MG CAPS Take 1 capsule by mouth at bedtime as needed. For sleep  . Menthol, Topical  Analgesic, (ABSORBINE PAIN RELIEVING) 10 % GEL Apply 1 application topically 2 (two) times daily as needed. Apply to right knee  . Menthol, Topical Analgesic, (BIOFREEZE) 4 % GEL Apply 1 application topically 2 (two) times daily as needed.   . montelukast (SINGULAIR) 10 MG tablet Take 10 mg by mouth at bedtime.  Marland Kitchen nystatin cream (MYCOSTATIN) Apply 1 application topically 2 (two) times daily as needed for dry skin.  Marland Kitchen oxyCODONE-acetaminophen (PERCOCET/ROXICET) 5-325 MG tablet Take 1-2 tablets by mouth every 4 (four) hours as needed for moderate pain.  . progesterone (PROMETRIUM) 100 MG capsule Take 100 mg by mouth at bedtime.  Marland Kitchen Propylene Glycol (SYSTANE BALANCE OP) Place 2 drops into both eyes 2 (two) times daily as needed (dry eyes).    No facility-administered encounter medications on file as of 06/15/2016.     PAST MEDICAL HISTORY:   Past Medical History:  Diagnosis Date  . Arthritis    fingers,right shoulder  . 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 Chronic lumbar radiculopathy    L5- S1  right leg  . Depression   . Dizziness   . Frequency of urination   . Hard of hearing   . History of kidney stones 05/12/15   surgery   . HTN (hypertension)   . Hyperlipidemia   . 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[place])   . Numbness and tingling in hands   . OSA (obstructive sleep apnea)    moderate OSA per study 11-22-2007--  refused CPAP but used oxygen for 6 months at night,  states due to wt loss stopped using oxygen  . Polyneuropathy, diabetic ([place])      WALKS W/ CANE FOR BALANCE  . Right ureteral stone   . Shortness of breath dyspnea   . SUI (stress urinary incontinence, female)   . Type 2 diabetes mellitus (HCC)    Type II - Diet controlled  . Urinary incontinence   . Wears glasses     PAST SURGICAL HISTORY:   Past Surgical History:  Procedure Laterality Date  . ANTERIOR CERVICAL DECOMP/DISCECTOMY FUSION N/A 03/23/2016   Procedure: Cervical Four-Five, Cervical Five-Six, Cervical Six-Seven Anterior cervical decompression/diskectomy/fusion;  Surgeon: Leeroy Cha, MD;  Location: [facility];  Service: Neurosurgery;  Laterality: N/A;  C4-5 C5-6 C6-7 Anterior cervical decompression/diskectomy/fusion  . CARDIOVASCULAR STRESS TEST  09-30-2007     normal Adenosine study/  no ischemia /  normal LV function and wall motion , ef 82%  . COLONOSCOPY    . CYSTOSCOPY WITH RETROGRADE PYELOGRAM, URETEROSCOPY AND STENT PLACEMENT Right 05/12/2015   Procedure: CYSTOSCOPY WITH RETROGRADE PYELOGRAM, URETEROSCOPY,STONE EXTRACTION AND STENT PLACEMENT;  Surgeon: Franchot Gallo, MD;  Location: [facility];  Service: Urology;  Laterality: Right;  . EXTRACORPOREAL SHOCK WAVE LITHOTRIPSY Right 08-18-2012  . HOLMIUM LASER APPLICATION Right 0000000   Procedure: HOLMIUM LASER APPLICATION;  Surgeon: Franchot Gallo, MD;  Location: [facility];  Service: Urology;  Laterality: Right;  . TRANSTHORACIC ECHOCARDIOGRAM  09-23-2007   pseudonormal LV filling pattern,  ef 65-70%/  trivial AR/  mild LAE  . TUBAL LIGATION  1985    SOCIAL HISTORY:   Social History   Social History  . Marital status: Widowed    Spouse name: N/A  . Number of children: 1  . Years of education: College   Occupational History  . office manager   .  [employer]   Social History Main Topics  .  Smoking status: Never Smoker  . Smokeless tobacco: Never Used  . Alcohol use 0.0 oz/week     Comment: once every 6 months  . Drug use: No  . Sexual  activity: Not on file   Other Topics Concern  . Not on file   Social History Narrative   Patient lives at home with her dog name "Hot Rod".   Caffeine Use: 2-3 cups of coffee a day        FAMILY HISTORY:   Family Status  Relation Status  . Father Deceased at age 79   heart disease  . Mother Deceased at age 65   stroke  . Sister Alive   asthma  . Sister Deceased   breast cancer  . Sister Deceased   breast cancer    ROS:  A complete 10 system review of systems was obtained and was unremarkable apart from what is mentioned above.  PHYSICAL EXAMINATION:    VITALS:   There were no vitals filed for this visit.   GEN:  The patient appears stated age and is in NAD. HEENT:  Normocephalic, atraumatic.  The mucous membranes are moist. The superficial temporal arteries are without ropiness or tenderness.  No fasciculations in tongue. CV:  RRR Lungs:  CTAB Neck/HEME:  There are no carotid bruits bilaterally.  Neurological examination:  Orientation: The patient is alert and oriented x3.  Cranial nerves: There is good facial symmetry with the exception of mild L ptosis.  Extraocular muscles are intact but she has some trouble with upgaze.  She has difficulty with smooth pursuit.  She has square wave jerks.   The visual fields are full to confrontational testing. The speech is fluent and clear.   Some trouble with the gutteral sounds.  Soft palate rises symmetrically and there is no tongue deviation. Hearing is intact to conversational tone. Sensation: Sensation is intact to light touch throughout Motor: Strength is 5/5 in the bilateral upper and lower extremities.   Shoulder shrug is equal and symmetric.  There is no pronator drift.    Movement examination: Tone: There is normal tone in the bilateral upper extremities.  The tone in the lower extremities is normal.  Abnormal movements: no tremor; no dyskinesia; there is abnormal posture of the head with right head tilt and the right  ear approximates the right shoulder Coordination:  There is decremation with RAM's, seen with virtually all forms of RAMs on the right Gait and Station: The patient arises out of the chair without the walker.  She then uses the walker and purposefully tries to go heel to toe.  She has trouble with turns and walks well until she tries to sit and then she drags the right leg.  ASSESSMENT/PLAN:  1.  MSA-C  -long discussion with the patient again today.  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  Talked to her about dx, pathophysiology, prognosis, difference between this and PD.  Her insurance will not pay for DaT scanning.  -The dizziness did not change when she discontinued the levodopa.  For now, I would recommend that we continue carbidopa/levodopa 25/100, 2/2/1  -Talked about merry walker, which may be of value in the near future.  Told her that at some point, I may tell her to not walk at all.  Much of mortality related to falls  -talked to her about driving.  Hold until see [facility].  Will refer.  -talked to her about assisted living and she is looking into this  -talked to her about end of life issues.  Wishes to be DNR.  Has POA and will bring me that paperwork  -asked about future expectations and we discussed 2.  Cervical spinal stenosis and NFS.  -She is status post anterior cervical discectomy from the C4-C7 levels on 03/23/2016. 3.  Mild B12 deficiency  -is on B12 supplement 1048mcg daily 4.  Constipation  -given rancho recipe 5.  Much greater than 50% of this visit was spent in counseling with the patient and the caregiver.  Total face to face time:  40 min

## 2016-06-15 ENCOUNTER — Ambulatory Visit (INDEPENDENT_AMBULATORY_CARE_PROVIDER_SITE_OTHER): Payer: BLUE CROSS/BLUE SHIELD | Admitting: Neurology

## 2016-06-15 ENCOUNTER — Encounter: Payer: Self-pay | Admitting: Neurology

## 2016-06-15 VITALS — BP 140/72 | HR 86 | Ht 61.5 in | Wt 229.0 lb

## 2016-06-15 DIAGNOSIS — G903 Multi-system degeneration of the autonomic nervous system: Secondary | ICD-10-CM | POA: Diagnosis not present

## 2016-06-15 DIAGNOSIS — G238 Other specified degenerative diseases of basal ganglia: Secondary | ICD-10-CM

## 2016-06-15 DIAGNOSIS — H532 Diplopia: Secondary | ICD-10-CM | POA: Diagnosis not present

## 2016-06-15 NOTE — Patient Instructions (Signed)
1. Appt made with Dr. Valetta Close at [facility] on Tuesday 07/17/16 at 1:30 pm. If this this not a good date/time you can call [phone] to reschedule.

## 2016-06-18 ENCOUNTER — Telehealth: Payer: Self-pay | Admitting: Clinical

## 2016-06-18 NOTE — Telephone Encounter (Signed)
CSW called patient and left message to introduce self and discuss services as patient has expressed interest in CSW services and atypical support group in the future.   Alison Murray, LCSW

## 2016-06-30 ENCOUNTER — Emergency Department (HOSPITAL_BASED_OUTPATIENT_CLINIC_OR_DEPARTMENT_OTHER)
Admission: EM | Admit: 2016-06-30 | Discharge: 2016-06-30 | Disposition: A | Discharge status: Home / Self Care | Payer: BLUE CROSS/BLUE SHIELD | Attending: Emergency Medicine | Admitting: Emergency Medicine

## 2016-06-30 ENCOUNTER — Emergency Department (HOSPITAL_BASED_OUTPATIENT_CLINIC_OR_DEPARTMENT_OTHER): Payer: BLUE CROSS/BLUE SHIELD

## 2016-06-30 ENCOUNTER — Encounter (HOSPITAL_BASED_OUTPATIENT_CLINIC_OR_DEPARTMENT_OTHER): Payer: Self-pay | Admitting: *Deleted

## 2016-06-30 DIAGNOSIS — Y92238 Other place in hospital as the place of occurrence of the external cause: Secondary | ICD-10-CM | POA: Diagnosis not present

## 2016-06-30 DIAGNOSIS — Z79899 Other long term (current) drug therapy: Secondary | ICD-10-CM | POA: Diagnosis not present

## 2016-06-30 DIAGNOSIS — S6291XA Unspecified fracture of right wrist and hand, initial encounter for closed fracture: Secondary | ICD-10-CM | POA: Diagnosis present

## 2016-06-30 DIAGNOSIS — Y9301 Activity, walking, marching and hiking: Secondary | ICD-10-CM | POA: Insufficient documentation

## 2016-06-30 DIAGNOSIS — Y999 Unspecified external cause status: Secondary | ICD-10-CM | POA: Insufficient documentation

## 2016-06-30 DIAGNOSIS — I10 Essential (primary) hypertension: Secondary | ICD-10-CM | POA: Insufficient documentation

## 2016-06-30 DIAGNOSIS — Z7982 Long term (current) use of aspirin: Secondary | ICD-10-CM | POA: Diagnosis not present

## 2016-06-30 DIAGNOSIS — S42201A Unspecified fracture of upper end of right humerus, initial encounter for closed fracture: Secondary | ICD-10-CM | POA: Insufficient documentation

## 2016-06-30 DIAGNOSIS — W1800XA Striking against unspecified object with subsequent fall, initial encounter: Secondary | ICD-10-CM | POA: Diagnosis not present

## 2016-06-30 DIAGNOSIS — E119 Type 2 diabetes mellitus without complications: Secondary | ICD-10-CM | POA: Diagnosis not present

## 2016-06-30 MED ORDER — MORPHINE SULFATE (PF) 4 MG/ML IV SOLN
4.0000 mg | INTRAVENOUS | Status: AC | PRN
  Administered 2016-06-30 (×2): 2 mg via INTRAVENOUS
  Filled 2016-06-30: qty 1

## 2016-06-30 MED ORDER — HYDROCODONE-ACETAMINOPHEN 5-325 MG PO TABS: 1.0000 | ORAL_TABLET | ORAL | 0 refills | Status: DC | PRN

## 2016-06-30 NOTE — ED Triage Notes (Signed)
Per EMS -- pt fell while using walker (got it caught on threshold). Denies hitting head, LOC. Reports R shoulder and arm pain (EMS sling in place). Denies other pain. Denies taking blood thinners other than aspirin at night.

## 2016-06-30 NOTE — ED Notes (Signed)
Pt on bedpan.

## 2016-06-30 NOTE — Discharge Instructions (Signed)
Use the shoulder immobilizer for comfort. Follow up with an orthopedic doctor. Call to arrange a follow-up appointment next week

## 2016-06-30 NOTE — ED Provider Notes (Signed)
[facility] DEPT [facility] Provider Note   CSN: [id number] Arrival date & time: 06/30/16  1127     History   Chief Complaint Chief Complaint  Patient presents with  . Fall    HPI [name] [name] is a 65 y.o. female.  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 Patient uses a walker. She was walking into a veterinarian's office with her dog when she lost her balance, stumbled and fell. Patient landed on her right arm. She now has pain in her right shoulder all the way down to her wrist. She denies any other injuries. No head injury. No loss of consciousness. No difficulty with chest pain or shortness of breath. No abdominal pain. No vomiting or diarrhea. Past Medical History:  Diagnosis Date  . Arthritis    fingers,right shoulder  . 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 Chronic lumbar radiculopathy    L5- S1  right leg  . Depression   . Dizziness   . Frequency of urination   . Hard of hearing   . History of kidney stones 05/12/15   surgery   . HTN (hypertension)   . Hyperlipidemia   . 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 Numbness and tingling in hands   . OSA (obstructive sleep apnea)    moderate OSA per study 11-22-2007--  refused CPAP but used oxygen for 6 months at night,  states due to wt loss stopped using oxygen  . Polyneuropathy, diabetic ([place])    WALKS W/ CANE FOR BALANCE  . Right ureteral stone   . Shortness of breath dyspnea   . SUI (stress urinary incontinence, female)   . Type 2 diabetes mellitus (HCC)    Type II - Diet controlled  . Urinary incontinence   . Wears glasses     Patient Active Problem List   Diagnosis Date Noted  . Cervical stenosis of spinal canal 03/23/2016  . OSA on CPAP 12/14/2015  . Dizziness and giddiness 11/24/2013  . Diabetes type 2, uncontrolled ([place]) 11/24/2013  . Severe obesity (BMI >= 40) ([place])  11/24/2013  . Lumbar radiculopathy 11/24/2013    Past Surgical History:  Procedure Laterality Date  . ANTERIOR CERVICAL DECOMP/DISCECTOMY FUSION N/A 03/23/2016   Procedure: Cervical Four-Five, Cervical Five-Six, Cervical Six-Seven Anterior cervical decompression/diskectomy/fusion;  Surgeon: Leeroy Cha, MD;  Location: [facility];  Service: Neurosurgery;  Laterality: N/A;  C4-5 C5-6 C6-7 Anterior cervical decompression/diskectomy/fusion  . CARDIOVASCULAR STRESS TEST  09-30-2007     normal Adenosine study/  no ischemia /  normal LV function and wall motion , ef 82%  . COLONOSCOPY    . CYSTOSCOPY WITH RETROGRADE PYELOGRAM, URETEROSCOPY AND STENT PLACEMENT Right 05/12/2015   Procedure: CYSTOSCOPY WITH RETROGRADE PYELOGRAM, URETEROSCOPY,STONE EXTRACTION AND STENT PLACEMENT;  Surgeon: Franchot Gallo, MD;  Location: [facility];  Service: Urology;  Laterality: Right;  . EXTRACORPOREAL SHOCK WAVE LITHOTRIPSY Right 08-18-2012  . HOLMIUM LASER APPLICATION Right 0000000   Procedure: HOLMIUM LASER APPLICATION;  Surgeon: Franchot Gallo, MD;  Location: [facility];  Service: Urology;  Laterality: Right;  . TRANSTHORACIC ECHOCARDIOGRAM  09-23-2007   pseudonormal LV filling pattern,  ef 65-70%/  trivial AR/  mild LAE  . TUBAL LIGATION  1985    OB History    No data available       Home Medications  Prior to Admission medications   Medication Sig Start Date End Date Taking? Authorizing Provider  acetaminophen (TYLENOL) 650 MG CR tablet Take 650-1,300 mg by mouth every 8 (eight) hours as needed for pain.    Yes Historical Provider, MD  aspirin 81 MG tablet Take 81 mg by mouth daily.   Yes Historical Provider, MD  azelastine (ASTELIN) 0.1 % nasal spray Place 1 spray into both nostrils 2 (two) times daily.  12/23/14  Yes Historical Provider, MD  carbidopa-levodopa (SINEMET IR) 25-100 MG tablet 2 tablets in the morning. 2 tablets at lunch. 1 tablet at bedtime.  05/14/16  Yes Rebecca S Tat, DO  Cholecalciferol (VITAMIN D PO) Take 5,000 Units by mouth daily.   Yes Historical Provider, MD  docusate sodium (COLACE) 100 MG capsule Take 100 mg by mouth daily as needed for mild constipation.    Yes Historical Provider, MD  escitalopram (LEXAPRO) 10 MG tablet Take 5 mg by mouth daily. 06/08/16  Yes Historical Provider, MD  fenofibrate 160 MG tablet Take 160 mg by mouth every morning.    Yes Historical Provider, MD  fluticasone (FLONASE) 50 MCG/ACT nasal spray Place 2 sprays into both nostrils daily.  12/23/14  Yes Historical Provider, MD  loratadine (CLARITIN) 10 MG tablet Take 10 mg by mouth every morning.   Yes Historical Provider, MD  losartan (COZAAR) 100 MG tablet Take 50 mg by mouth every morning.   Yes Historical Provider, MD  Melatonin 5 MG CAPS Take 1 capsule by mouth at bedtime as needed. For sleep   Yes Historical Provider, MD  Menthol, Topical Analgesic, (BIOFREEZE) 4 % GEL Apply 1 application topically 2 (two) times daily as needed.    Yes Historical Provider, MD  montelukast (SINGULAIR) 10 MG tablet Take 10 mg by mouth at bedtime.   Yes Historical Provider, MD  nystatin cream (MYCOSTATIN) Apply 1 application topically 2 (two) times daily as needed for dry skin.   Yes Historical Provider, MD  progesterone (PROMETRIUM) 100 MG capsule Take 100 mg by mouth at bedtime. 12/20/14  Yes Historical Provider, MD  Propylene Glycol (SYSTANE BALANCE OP) Place 2 drops into both eyes 2 (two) times daily as needed (dry eyes).    Yes Historical Provider, MD  HYDROcodone-acetaminophen (NORCO/VICODIN) 5-325 MG tablet Take 1 tablet by mouth every 4 (four) hours as needed. 06/30/16   Dorie Rank, MD    Family History Family History  Problem Relation Age of Onset  . Heart Problems Father   . Stroke Mother   . Hypertension    . Stroke    . Breast cancer Sister     Social History Social History  Substance Use Topics  . Smoking status: Never Smoker  . Smokeless tobacco:  Never Used  . Alcohol use 0.0 oz/week     Comment: once every 6 months     Allergies   Aleve [naproxen] and Zocor [simvastatin]   Review of Systems Review of Systems  All other systems reviewed and are negative.    Physical Exam Updated Vital Signs BP 132/59 (BP Location: Left Arm)   Pulse 86   Temp 98.5 F (36.9 C) (Oral)   Resp 18   Ht 5' 1.5" (1.562 m)   Wt 103.1 kg   SpO2 97%   BMI 42.27 kg/m   Physical Exam  Constitutional: She appears well-developed and well-nourished. No distress.  Overweight  HENT:  Head: Normocephalic and atraumatic.  Right Ear: External ear normal.  Left Ear: External ear normal.  Eyes: Conjunctivae are normal. Right eye exhibits no discharge. Left eye exhibits no discharge. No scleral icterus.  Neck: Neck supple. No tracheal deviation present.  No cervical spine tenderness  Cardiovascular: Normal rate, regular rhythm and intact distal pulses.   Pulmonary/Chest: Effort normal and breath sounds normal. No stridor. No respiratory distress. She has no wheezes. She has no rales.  Abdominal: Soft. Bowel sounds are normal. She exhibits no distension. There is no tenderness. There is no rebound and no guarding.  Musculoskeletal: She exhibits no edema.       Right shoulder: She exhibits decreased range of motion, tenderness and bony tenderness. She exhibits no swelling.       Left shoulder: Normal.       Right elbow: She exhibits no swelling, no effusion and no deformity. Tenderness found.       Right wrist: She exhibits tenderness. She exhibits normal range of motion, no bony tenderness and no swelling.       Right hip: Normal.       Left hip: Normal.       Right knee: She exhibits ecchymosis (and small abrasion). She exhibits normal range of motion, no swelling, no effusion and no laceration. No tenderness found.       Left knee: Normal.       Right ankle: Normal.       Left ankle: Normal.       Cervical back: Normal.       Thoracic back:  Normal.       Lumbar back: Normal.  No thoracic or lumbar spine tenderness  Neurological: She is alert. No cranial nerve deficit (no facial droop, extraocular movements intact, no slurred speech) or sensory deficit. She exhibits normal muscle tone. She displays no seizure activity. Coordination abnormal.  Generalized weakness but no focal deficits  Skin: Skin is warm and dry. No rash noted.  Psychiatric: She has a normal mood and affect.  Nursing note and vitals reviewed.    ED Treatments / Results    Radiology Dg Shoulder Right  Result Date: 06/30/2016 CLINICAL DATA:  Pain following fall EXAM: RIGHT SHOULDER - 2+ VIEW COMPARISON:  None. FINDINGS: Frontal and Y scapular views were obtained. There is avulsion along the greater tuberosity of the proximal humerus. There is also a transversely oriented fracture through the proximal humeral metaphysis with alignment essentially anatomic in this area. No other fractures are evident. No dislocation. There is moderate generalized osteoarthritic change. Visualized right lung is clear. IMPRESSION: Nondisplaced fracture proximal right humeral metaphysis. Avulsion greater tuberosity on the right. No dislocation. Moderate generalized osteoarthritic change. Electronically Signed   By: Lowella Grip III M.D.   On: 06/30/2016 13:39   Dg Elbow Complete Right  Result Date: 06/30/2016 CLINICAL DATA:  Pain following fall EXAM: RIGHT ELBOW - COMPLETE 3+ VIEW COMPARISON:  None. FINDINGS: Frontal, lateral, and bilateral oblique views were obtained. There is mild soft tissue swelling. There is no demonstrable fracture or dislocation. The joint spaces appear normal. No erosive change. IMPRESSION: Mild soft tissue swelling. No fracture or dislocation. No apparent arthropathy. Electronically Signed   By: Lowella Grip III M.D.   On: 06/30/2016 13:40   Dg Wrist Complete Right  Result Date: 06/30/2016 CLINICAL DATA:  Pain following fall EXAM: RIGHT WRIST -  COMPLETE 3+ VIEW COMPARISON:  None. FINDINGS: Frontal, oblique, lateral, and ulnar deviation scaphoid images were obtained. There is no demonstrable fracture or dislocation. The joint spaces appear normal. No erosive change. IMPRESSION: No fracture  or dislocation.  No apparent arthropathy. Electronically Signed   By: Lowella Grip III M.D.   On: 06/30/2016 13:41    Procedures Procedures (including critical care time)  Medications Ordered in ED Medications  morphine 4 MG/ML injection 4 mg (2 mg Intravenous Given 06/30/16 1244)     Initial Impression / Assessment and Plan / ED Course  I have reviewed the triage vital signs and the nursing notes.  Pertinent labs & imaging results that were available during my care of the patient were reviewed by me and considered in my medical decision making (see chart for details).  Clinical Course    Patient's x-rays show a proximal humerus fracture. Elbow and wrist x-rays are unremarkable. Plan on discharge home with a shoulder immobilizer and outpatient orthopedic follow-up. I have prescribed her hydrocodone for her pain.  Final Clinical Impressions(s) / ED Diagnoses   Final diagnoses:  Proximal humeral fracture, right, closed, initial encounter    New Prescriptions New Prescriptions   HYDROCODONE-ACETAMINOPHEN (NORCO/VICODIN) 5-325 MG TABLET    Take 1 tablet by mouth every 4 (four) hours as needed.     Dorie Rank, MD 06/30/16 1409

## 2016-07-01 IMAGING — CR DG CERVICAL SPINE COMPLETE 4+V
7 series · 7 of 7 positions shown · non-contrast
Comparison: None.

CLINICAL DATA: Fell backwards with blunt trauma to the head and
neck with persistent pain, initial encounter

EXAM:
CERVICAL SPINE  4+ VIEWS

[w cervical spine lat]
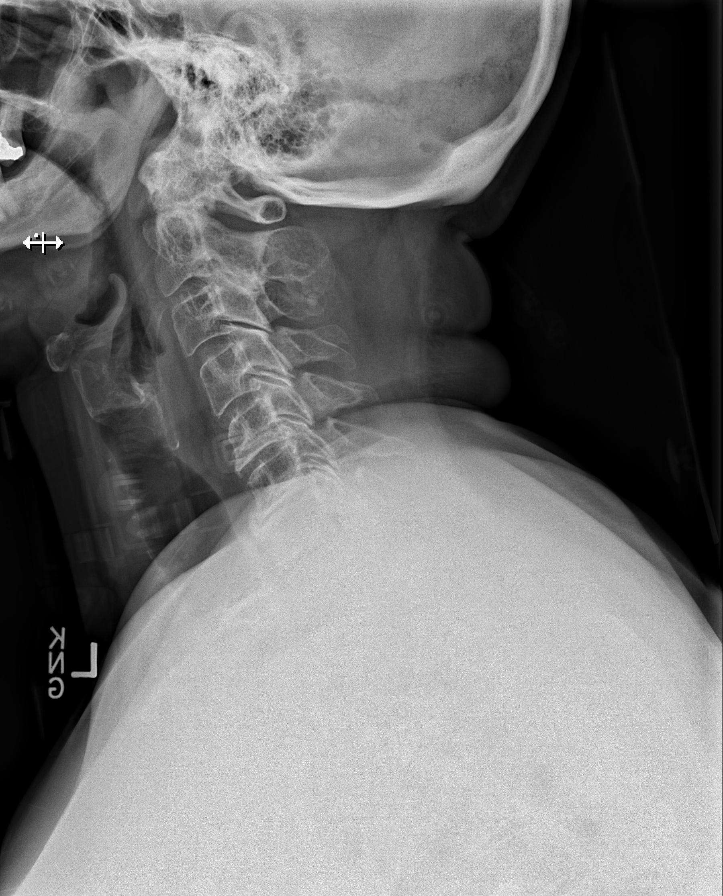

[w cervical spine ap (1 of 2)]
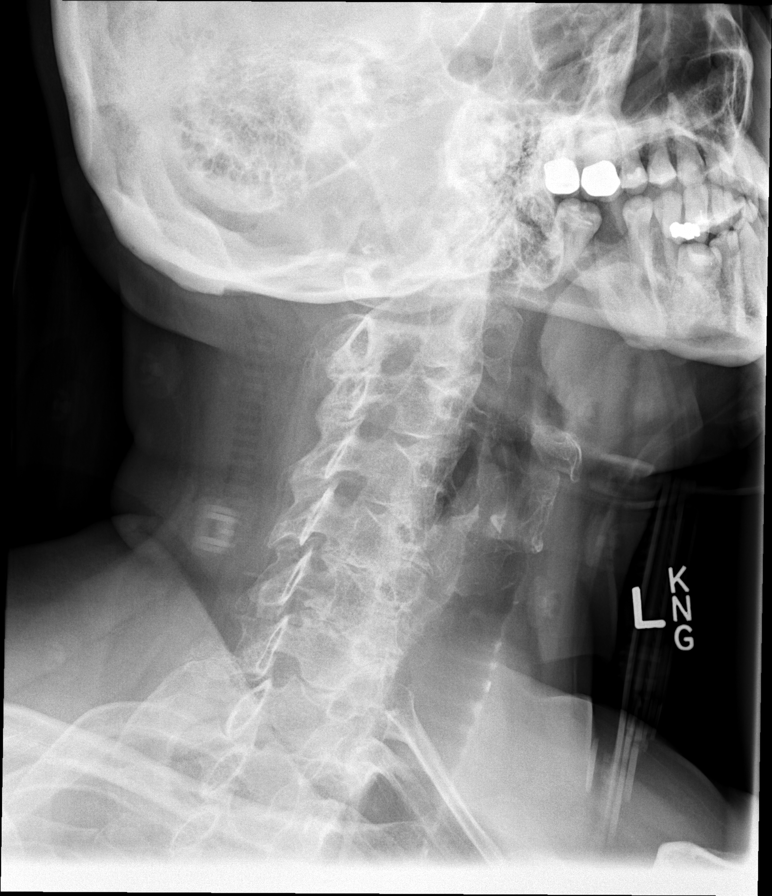

[w cervical spine ap (2 of 2)]
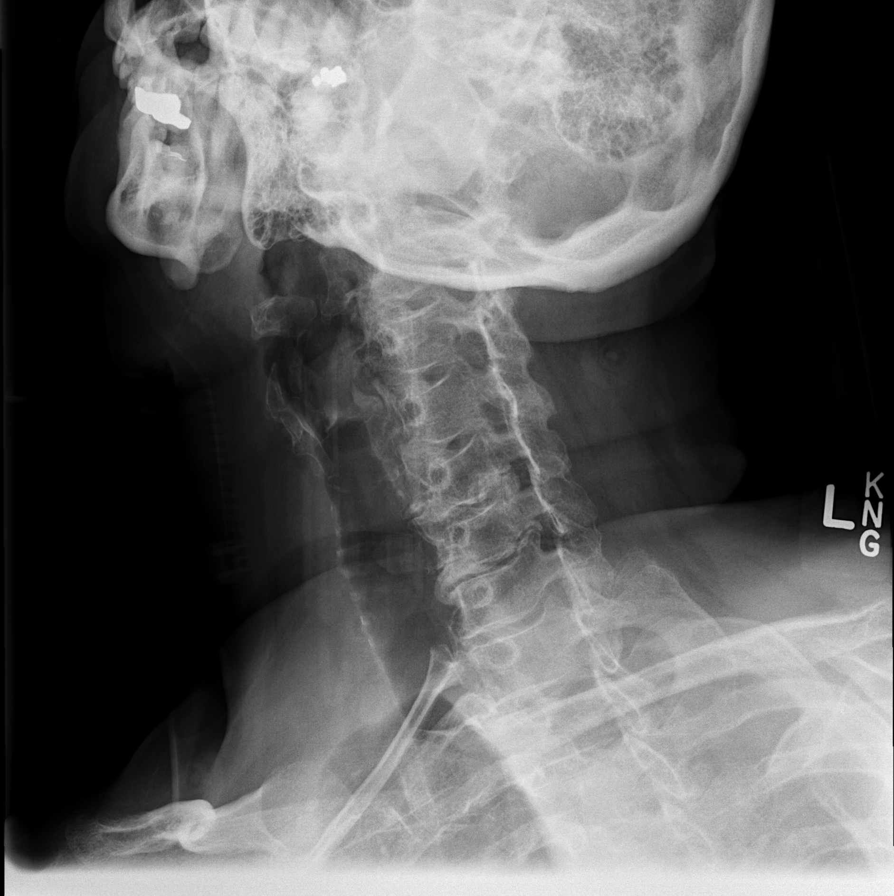

[w cervical spine odontoid (1 of 3)]
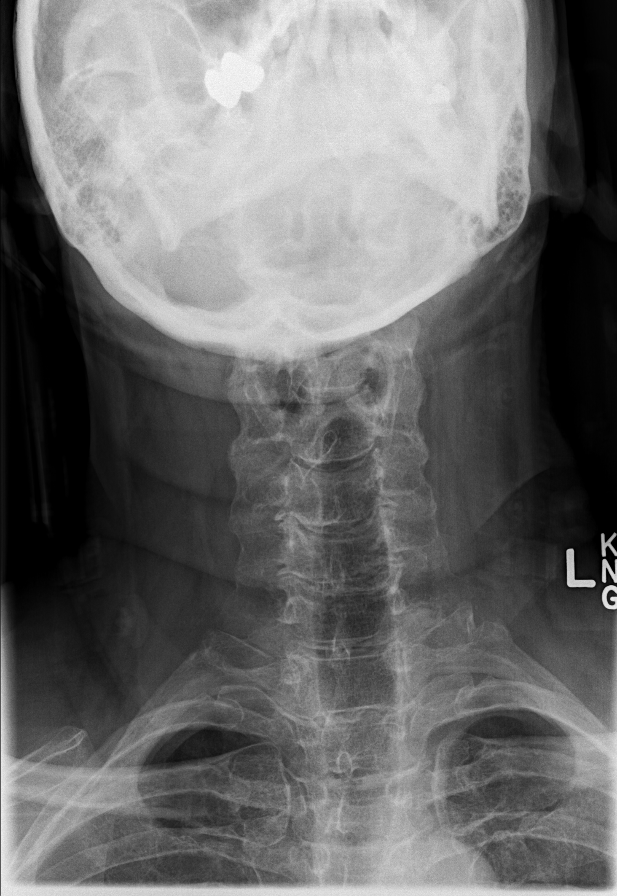

[w cervical spine odontoid (2 of 3)]
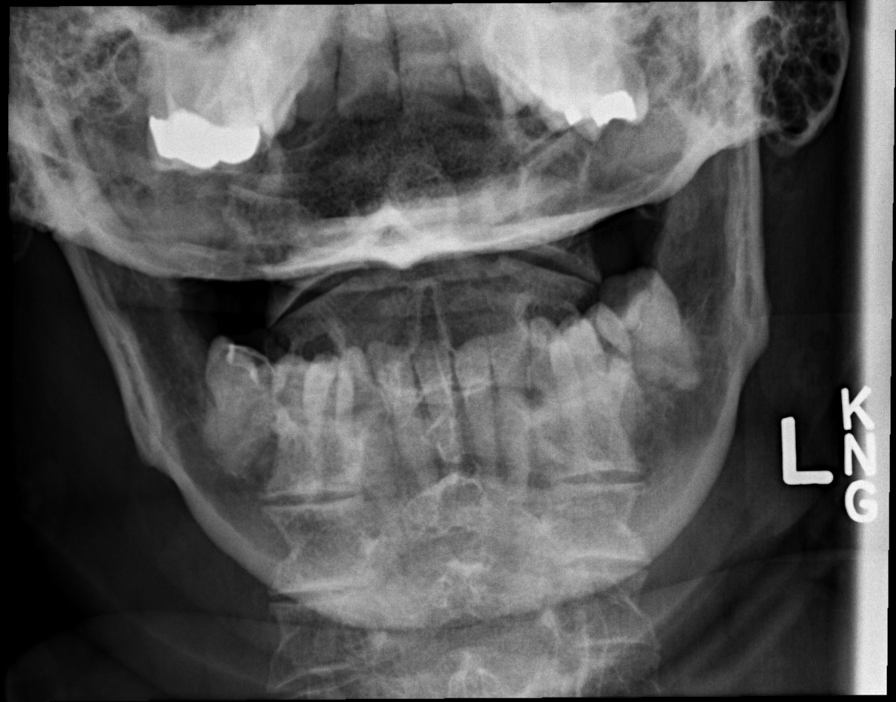

[w cervical spine odontoid (3 of 3)]
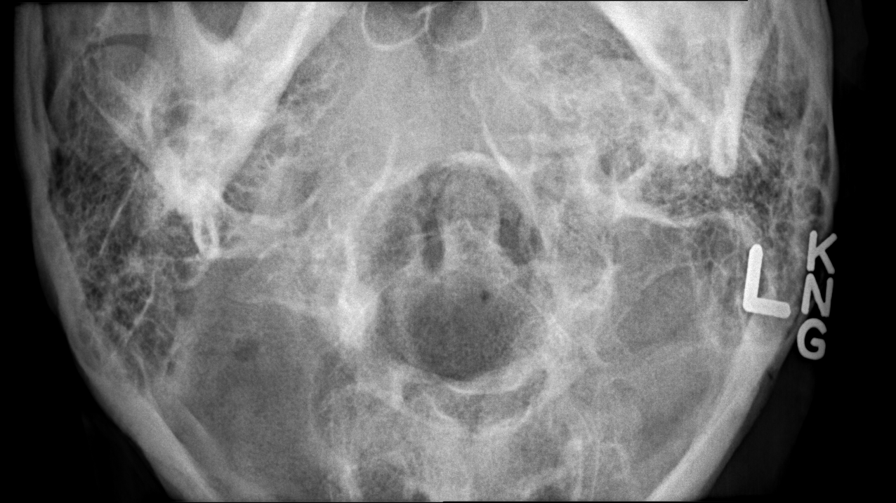

[w cervical swimmers]
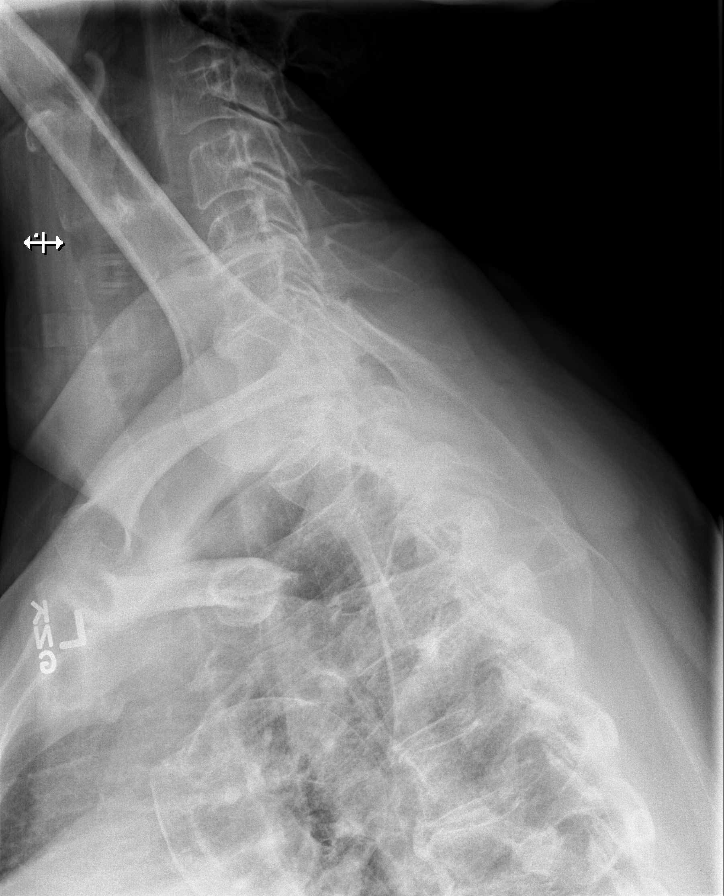

[7 of 7 positions shown; findings below may reference images not displayed]

FINDINGS: Seven cervical segments are well visualized. Mild osteophytic
changes are noted at C5-6 and C6-7. Mild neural foraminal narrowing
is noted at these levels bilaterally. Mild facet hypertrophic
changes are seen. The odontoid is within normal limits. No other
focal abnormality is seen.
IMPRESSION: Degenerative changes without acute abnormality. These are centered
most at C5-6 and C6-7.

## 2016-07-01 IMAGING — CR DG THORACIC SPINE 2V
2 series · 2 of 2 positions shown · non-contrast
Comparison: Chest radiographs 09/22/2007.

CLINICAL DATA: Back pain after falling 6 days ago diabetic
neuropathy and hypertension. Initial encounter.

EXAM:
THORACIC SPINE 2 VIEWS

[t thoracic spine ap]
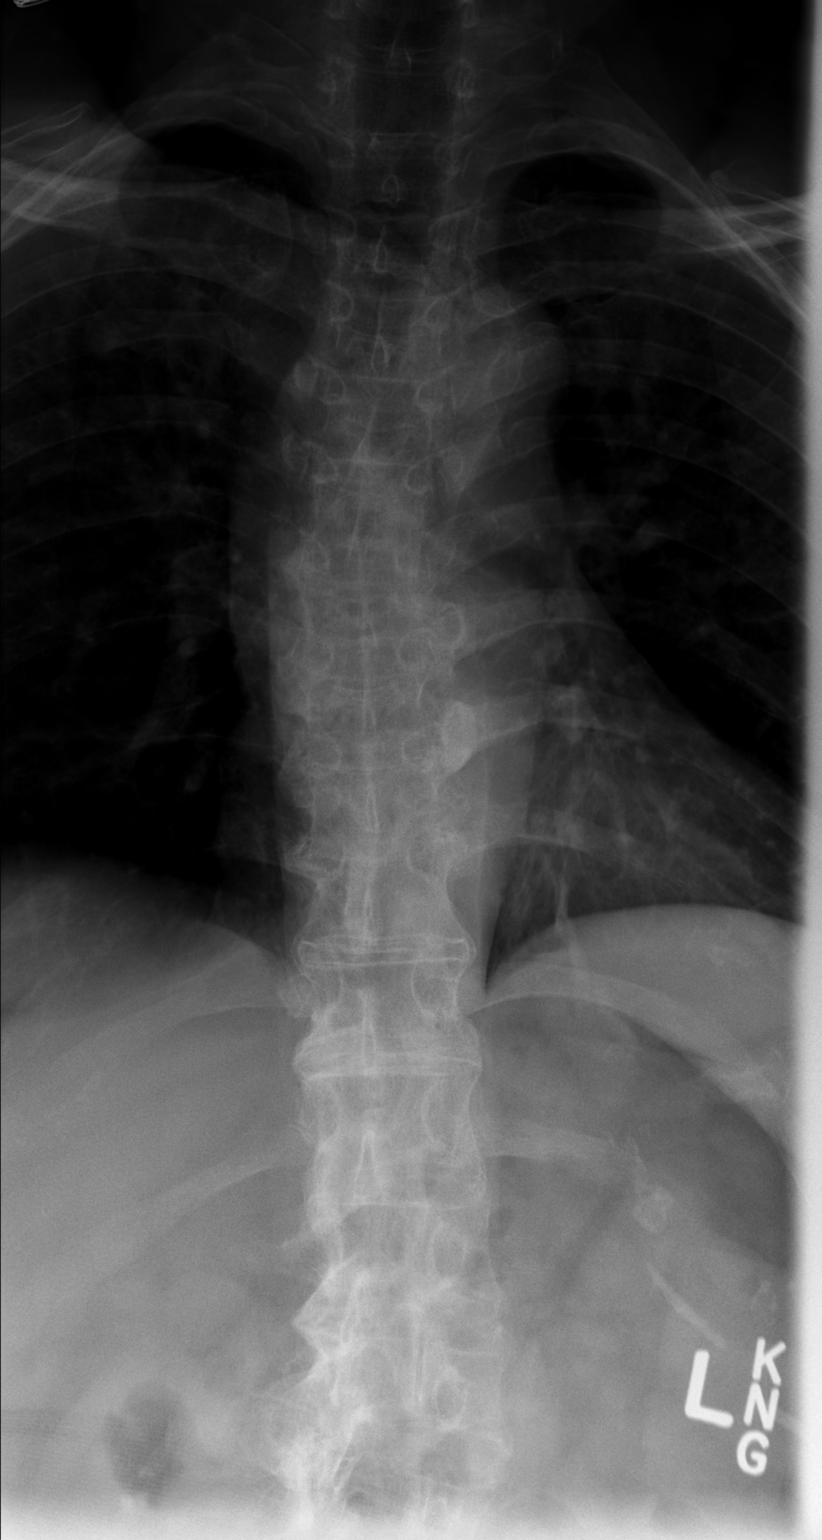

[t thoracic spine lat]
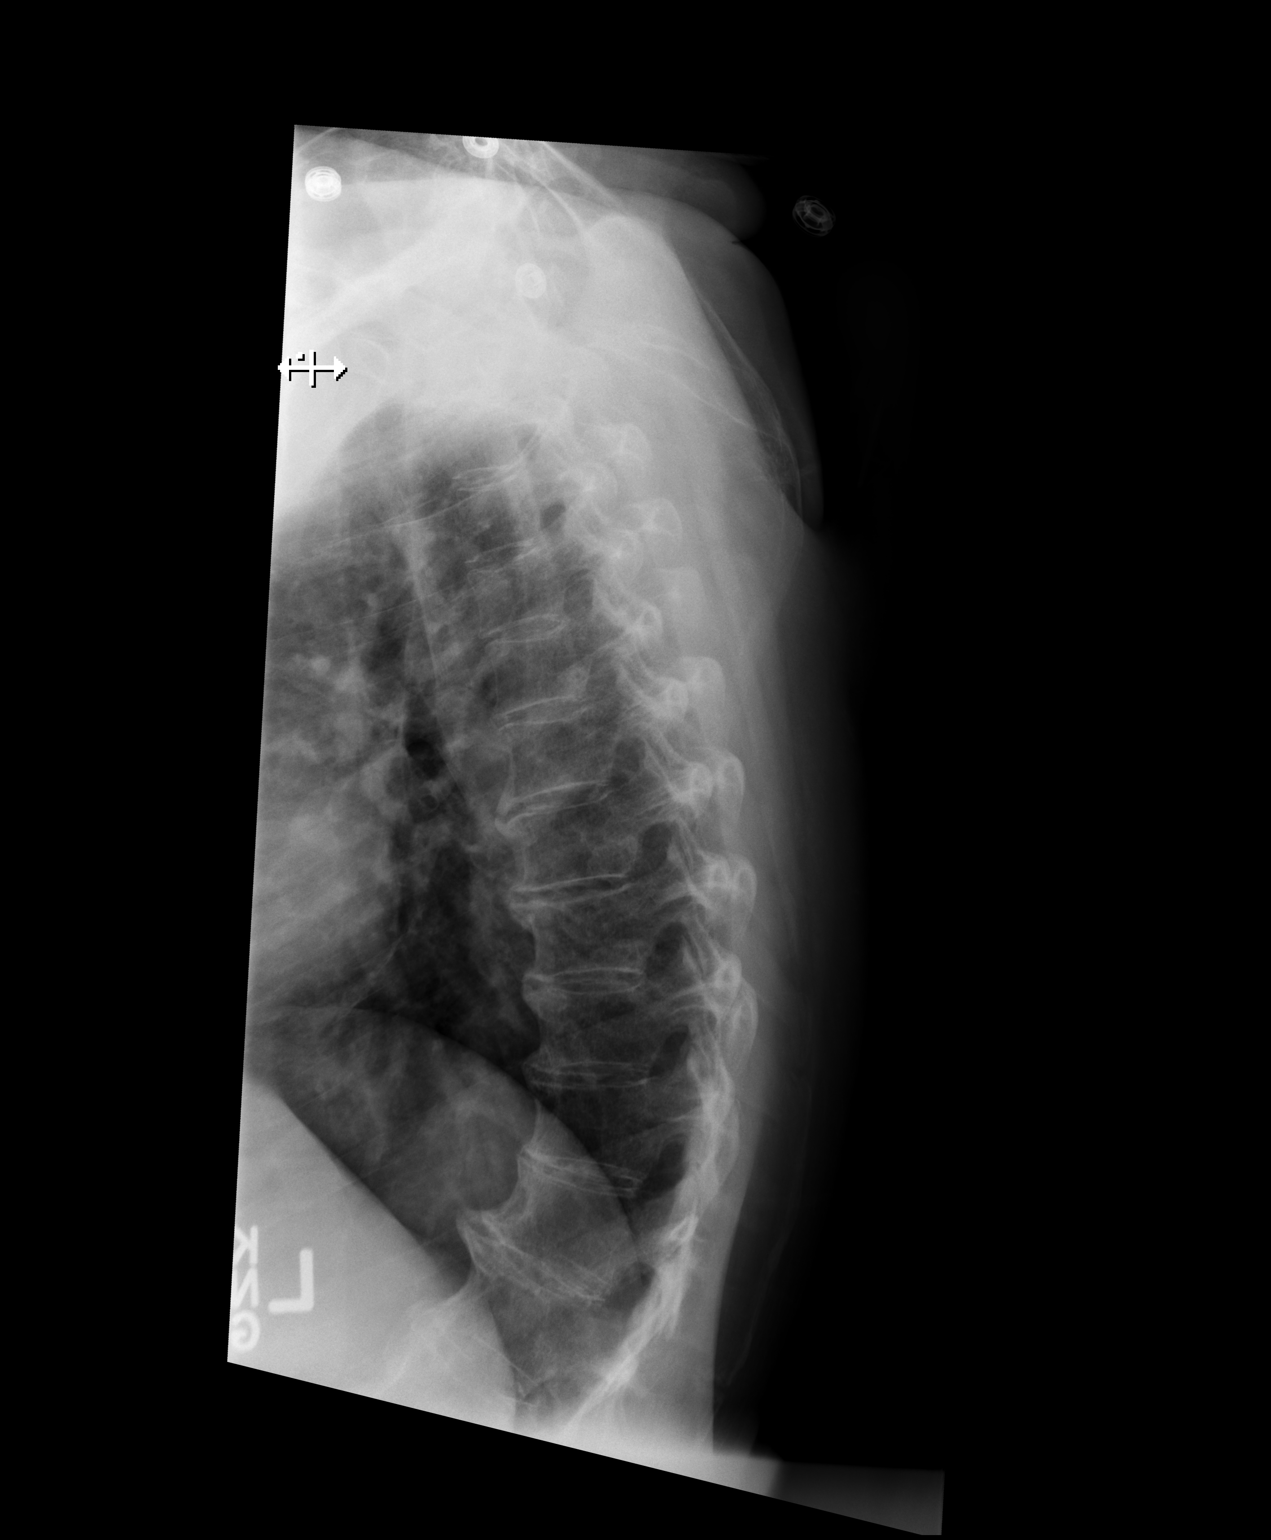

[2 of 2 positions shown; findings below may reference images not displayed]

FINDINGS: There are 12 rib-bearing thoracic type vertebral bodies. There is a
mild convex right scoliosis with normal lateral alignment. Mild
degenerative changes are present throughout the thoracic spine. No
evidence of acute fracture, paraspinal hematoma or widening of the
interpedicular distance.
IMPRESSION: No evidence of acute thoracic spine injury. Scoliosis and
degenerative changes noted.

## 2016-07-02 ENCOUNTER — Telehealth: Payer: Self-pay | Admitting: Neurology

## 2016-07-02 NOTE — Telephone Encounter (Signed)
He does have a signed release. Anything particular I should tell him?

## 2016-07-02 NOTE — Telephone Encounter (Signed)
Please see below.

## 2016-07-02 NOTE — Telephone Encounter (Signed)
Patient made aware.

## 2016-07-02 NOTE — Telephone Encounter (Signed)
Last visit I had told her that there will be a day that comes that I tell her not to walk and to stay seated and that day has unfortunately come. Needs to stay seated at all times.  Needs 24/7 assist.

## 2016-07-02 NOTE — Telephone Encounter (Signed)
[name] [name] 2051-01-11. Her son [name] was calling regarding her visit and would like to speak with you or Dr. Carles Collet. He needs to know what to expect with his mother. He said it was mentioned about an assisted living facility. He will be unavailable between 3-4 pm. His # is [phone]. Thank you

## 2016-07-02 NOTE — Telephone Encounter (Signed)
Patient has fallen three times in the month of aug and wants to let you know. This last time she broke her shoulder patient phone number is [phone]

## 2016-07-03 ENCOUNTER — Telehealth: Payer: Self-pay | Admitting: Clinical

## 2016-07-03 NOTE — Telephone Encounter (Signed)
CSW received return phone call from pt son, [name]. CSW introduced self and explained role. Pt son shared that he spent time at his mother's (pt) home this weekend. Pt son reports that pt mobility is much worse than he expected. Pt son states that he feels pt needs to be in assisted living facility and reports that pt is more open to transitioning into assisted living facility. CSW discussed with pt son assisted living facility placement and clarified questions. Pt son provided CSW with pt son e-mail address and CSW e-mailed pt son list of Assisted Living Facilities in [place] and [place] per pt son request. Pt son states that currently pt has 24 hour care between private duty care agency during the day and family friend throughout the night. CSW provided pt son with CSW contact information and pt son agreeable to contact CSW with any further questions or if further assistance needed.   Alison Murray, MSW, LCSW Clinical Social Worker [facility] [phone]

## 2016-07-03 NOTE — Telephone Encounter (Signed)
CSW contacted pt son, [name] via telephone and left message. Await call back in order to discuss assisted living placement with pt son and clarify questions.  Alison Murray, MSW, LCSW Clinical Social Worker [facility] [phone]

## 2016-07-03 NOTE — Telephone Encounter (Addendum)
Spoke with patient's son and given information.   He asked a lot of information about assisted living.  [facility]- could you reach out to patient's son with more information about assisted living in the area?

## 2016-07-03 NOTE — Telephone Encounter (Signed)
You can tell him I told her I recommend WC at all times and I recommend 24/7 assist ([facility] probably appropriate for her but shouldn't be living at home alone as she currently is)

## 2016-07-13 ENCOUNTER — Telehealth: Payer: Self-pay | Admitting: Neurology

## 2016-07-13 HISTORY — PX: SHOULDER ARTHROSCOPY: SHX128

## 2016-07-13 NOTE — Telephone Encounter (Signed)
Any advise to give patient other than to take medication AM of surgery if able and stay away from phenergan?

## 2016-07-13 NOTE — Telephone Encounter (Signed)
Patient made aware. She does have 24 hour care now.

## 2016-07-13 NOTE — Telephone Encounter (Signed)
[name] [name] [birth year]. She called today to let you and Dr. Carles Collet know that she fell on 06/30/16 and broke her shoulder. She was seen by her Orthopaedic Doctor yesterday and she needs to have surgery. She wanted to make sure that would be ok?  Her # is [phone]. Thank you

## 2016-07-13 NOTE — Telephone Encounter (Signed)
No, other than shouldn't be walking any more but she and I have discussed that.

## 2016-07-17 NOTE — H&P (Signed)
[name] [name] is an 65 y.o. female.    Chief Complaint: right shoulder pain  HPI: Pt is a 65 y.o. female complaining of right shoulder pain after ground level fall injuring right shoulder. Pain had continually increased since the beginning. X-rays in the clinic show displaced right proximal humerus fracture. Various options are discussed with the patient. Risks, benefits and expectations were discussed with the patient. Patient understand the risks, benefits and expectations and wishes to proceed with surgery.   PCP:  Rachell Cipro, MD  D/C Plans: Home  PMH: Past Medical History:  Diagnosis Date  . Arthritis    fingers,right shoulder  . 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 Chronic lumbar radiculopathy    L5- S1  right leg  . Depression   . Dizziness   . Frequency of urination   . Hard of hearing   . History of kidney stones 05/12/15   surgery   . HTN (hypertension)   . Hyperlipidemia   . 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)   . 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 Numbness and tingling in hands   . OSA (obstructive sleep apnea)    moderate OSA per study 11-22-2007--  refused CPAP but used oxygen for 6 months at night,  states due to wt loss stopped using oxygen  . Polyneuropathy, diabetic ([place])    WALKS W/ CANE FOR BALANCE  . Right ureteral stone   . Shortness of breath dyspnea   . SUI (stress urinary incontinence, female)   . Type 2 diabetes mellitus (HCC)    Type II - Diet controlled  . Urinary incontinence   . Wears glasses     PSH: Past Surgical History:  Procedure Laterality Date  . ANTERIOR CERVICAL DECOMP/DISCECTOMY FUSION N/A 03/23/2016   Procedure: Cervical Four-Five, Cervical Five-Six, Cervical Six-Seven Anterior cervical decompression/diskectomy/fusion;  Surgeon: Leeroy Cha, MD;  Location: [facility];  Service: Neurosurgery;  Laterality: N/A;  C4-5 C5-6 C6-7 Anterior cervical decompression/diskectomy/fusion  .  CARDIOVASCULAR STRESS TEST  09-30-2007     normal Adenosine study/  no ischemia /  normal LV function and wall motion , ef 82%  . COLONOSCOPY    . CYSTOSCOPY WITH RETROGRADE PYELOGRAM, URETEROSCOPY AND STENT PLACEMENT Right 05/12/2015   Procedure: CYSTOSCOPY WITH RETROGRADE PYELOGRAM, URETEROSCOPY,STONE EXTRACTION AND STENT PLACEMENT;  Surgeon: Franchot Gallo, MD;  Location[facility];  Service: Urology;  Laterality: Right;  . EXTRACORPOREAL SHOCK WAVE LITHOTRIPSY Right 08-18-2012  . HOLMIUM LASER APPLICATION Right 0000000   Procedure: HOLMIUM LASER APPLICATION;  Surgeon: Franchot Gallo, MD;  Location: [facility];  Service: Urology;  Laterality: Right;  . TRANSTHORACIC ECHOCARDIOGRAM  09-23-2007   pseudonormal LV filling pattern,  ef 65-70%/  trivial AR/  mild LAE  . TUBAL LIGATION  1985    Social History:  reports that she has never smoked. She has never used smokeless tobacco. She reports that she drinks alcohol. She reports that she does not use drugs.  Allergies:  Allergies  Allergen Reactions  . Aleve [Naproxen] Hives, Shortness Of Breath and Rash    Pt Avoids All Nsaids  . Zocor [Simvastatin] Other (See Comments)    "weird feeling all over body"    Medications: No current facility-administered medications for this encounter.    Current Outpatient Prescriptions  Medication Sig Dispense Refill  . acetaminophen (TYLENOL) 650 MG CR tablet Take 650-1,300 mg by mouth every 8 (eight) hours as  needed for pain.     Marland Kitchen aspirin 81 MG tablet Take 81 mg by mouth daily.    Marland Kitchen azelastine (ASTELIN) 0.1 % nasal spray Place 1 spray into both nostrils 2 (two) times daily.   11  . carbidopa-levodopa (SINEMET IR) 25-100 MG tablet 2 tablets in the morning. 2 tablets at lunch. 1 tablet at bedtime. 450 tablet 0  . Cholecalciferol (VITAMIN D PO) Take 5,000 Units by mouth daily.    Marland Kitchen docusate sodium (COLACE) 100 MG capsule Take 100 mg by mouth daily as needed for  mild constipation.     Marland Kitchen escitalopram (LEXAPRO) 10 MG tablet Take 5 mg by mouth daily.  2  . fenofibrate 160 MG tablet Take 160 mg by mouth every morning.     . fluticasone (FLONASE) 50 MCG/ACT nasal spray Place 2 sprays into both nostrils daily.   11  . HYDROcodone-acetaminophen (NORCO/VICODIN) 5-325 MG tablet Take 1 tablet by mouth every 4 (four) hours as needed. 20 tablet 0  . loratadine (CLARITIN) 10 MG tablet Take 10 mg by mouth every morning.    Marland Kitchen losartan (COZAAR) 100 MG tablet Take 50 mg by mouth every morning.    . Melatonin 5 MG CAPS Take 1 capsule by mouth at bedtime as needed. For sleep    . Menthol, Topical Analgesic, (BIOFREEZE) 4 % GEL Apply 1 application topically 2 (two) times daily as needed.     . montelukast (SINGULAIR) 10 MG tablet Take 10 mg by mouth at bedtime.    Marland Kitchen nystatin cream (MYCOSTATIN) Apply 1 application topically 2 (two) times daily as needed for dry skin.    . progesterone (PROMETRIUM) 100 MG capsule Take 100 mg by mouth at bedtime.  2  . Propylene Glycol (SYSTANE BALANCE OP) Place 2 drops into both eyes 2 (two) times daily as needed (dry eyes).       No results found for this or any previous visit (from the past 48 hour(s)). No results found.  ROS: Pain with rom of the right upper extremity  Physical Exam:  Alert and oriented 65 y.o. female in no acute distress Cranial nerves 2-12 intact Cervical spine: full rom with no tenderness, nv intact distally Chest: active breath sounds bilaterally, no wheeze rhonchi or rales Heart: regular rate and rhythm, no murmur Abd: non tender non distended with active bowel sounds Hip is stable with rom  Right shoulder with very limited rom due to known fracture nv intact distally No signs of open injury  Mild edema to right forearm and hand  Assessment/Plan Assessment: right proximal humerus fracture with displacement  Plan: Patient will undergo a right proximal humerus ORIF vs reverse total shoulder by Dr.  Veverly Fells at [facility]. Risks benefits and expectations were discussed with the patient. Patient understand risks, benefits and expectations and wishes to proceed.

## 2016-07-19 ENCOUNTER — Encounter (HOSPITAL_COMMUNITY): Payer: Self-pay | Admitting: *Deleted

## 2016-07-19 MED ORDER — CEFAZOLIN SODIUM-DEXTROSE 2-4 GM/100ML-% IV SOLN
2.0000 g | INTRAVENOUS | Status: AC
  Administered 2016-07-20: 2 g via INTRAVENOUS
  Filled 2016-07-19: qty 100

## 2016-07-20 ENCOUNTER — Encounter (HOSPITAL_COMMUNITY): Payer: Self-pay | Admitting: Surgery

## 2016-07-20 ENCOUNTER — Inpatient Hospital Stay (HOSPITAL_COMMUNITY)
Admission: RE | Admit: 2016-07-20 | Discharge: 2016-07-23 | DRG: 483 | Disposition: A | Discharge status: Home Health | Payer: BLUE CROSS/BLUE SHIELD | Source: Ambulatory Visit | Attending: Orthopedic Surgery | Admitting: Orthopedic Surgery

## 2016-07-20 ENCOUNTER — Inpatient Hospital Stay (HOSPITAL_COMMUNITY): Payer: BLUE CROSS/BLUE SHIELD

## 2016-07-20 ENCOUNTER — Inpatient Hospital Stay (HOSPITAL_COMMUNITY): Payer: BLUE CROSS/BLUE SHIELD | Admitting: Certified Registered Nurse Anesthetist

## 2016-07-20 ENCOUNTER — Encounter (HOSPITAL_COMMUNITY)
Admission: RE | Disposition: A | Discharge status: Home Health | Payer: Self-pay | Source: Ambulatory Visit | Attending: Orthopedic Surgery

## 2016-07-20 DIAGNOSIS — F329 Major depressive disorder, single episode, unspecified: Secondary | ICD-10-CM | POA: Diagnosis present

## 2016-07-20 DIAGNOSIS — S42251A Displaced fracture of greater tuberosity of right humerus, initial encounter for closed fracture: Secondary | ICD-10-CM | POA: Diagnosis present

## 2016-07-20 DIAGNOSIS — Z7982 Long term (current) use of aspirin: Secondary | ICD-10-CM | POA: Diagnosis not present

## 2016-07-20 DIAGNOSIS — Z96611 Presence of right artificial shoulder joint: Secondary | ICD-10-CM

## 2016-07-20 DIAGNOSIS — H9190 Unspecified hearing loss, unspecified ear: Secondary | ICD-10-CM | POA: Diagnosis present

## 2016-07-20 DIAGNOSIS — I10 Essential (primary) hypertension: Secondary | ICD-10-CM | POA: Diagnosis present

## 2016-07-20 DIAGNOSIS — W1830XA Fall on same level, unspecified, initial encounter: Secondary | ICD-10-CM | POA: Diagnosis present

## 2016-07-20 DIAGNOSIS — M25511 Pain in right shoulder: Secondary | ICD-10-CM | POA: Diagnosis present

## 2016-07-20 DIAGNOSIS — Z981 Arthrodesis status: Secondary | ICD-10-CM | POA: Diagnosis not present

## 2016-07-20 DIAGNOSIS — E785 Hyperlipidemia, unspecified: Secondary | ICD-10-CM | POA: Diagnosis present

## 2016-07-20 DIAGNOSIS — E1142 Type 2 diabetes mellitus with diabetic polyneuropathy: Secondary | ICD-10-CM | POA: Diagnosis present

## 2016-07-20 DIAGNOSIS — Z79899 Other long term (current) drug therapy: Secondary | ICD-10-CM

## 2016-07-20 DIAGNOSIS — Z96619 Presence of unspecified artificial shoulder joint: Secondary | ICD-10-CM

## 2016-07-20 DIAGNOSIS — G4733 Obstructive sleep apnea (adult) (pediatric): Secondary | ICD-10-CM | POA: Diagnosis present

## 2016-07-20 HISTORY — PX: ORIF HUMERUS FRACTURE: SHX2126

## 2016-07-20 LAB — BASIC METABOLIC PANEL
Anion gap: 9 (ref 5–15)
BUN: 13 mg/dL (ref 6–20)
CHLORIDE: 106 mmol/L (ref 101–111)
CO2: 25 mmol/L (ref 22–32)
CREATININE: 0.55 mg/dL (ref 0.44–1.00)
Calcium: 9.8 mg/dL (ref 8.9–10.3)
GFR calc non Af Amer: >60 mL/min (ref >60)
GLUCOSE: 118 mg/dL — AB (ref 65–99)
Potassium: 4.1 mmol/L (ref 3.5–5.1)
Sodium: 140 mmol/L (ref 135–145)

## 2016-07-20 LAB — GLUCOSE, CAPILLARY
GLUCOSE-CAPILLARY: 114 mg/dL — AB (ref 65–99)
GLUCOSE-CAPILLARY: 128 mg/dL — AB (ref 65–99)
GLUCOSE-CAPILLARY: 134 mg/dL — AB (ref 65–99)

## 2016-07-20 LAB — CBC
HCT: 41.4 % (ref 36.0–46.0)
Hemoglobin: 13.1 g/dL (ref 12.0–15.0)
MCH: 29 pg (ref 26.0–34.0)
MCHC: 31.6 g/dL (ref 30.0–36.0)
MCV: 91.6 fL (ref 78.0–100.0)
PLATELETS: 306 10*3/uL (ref 150–400)
RBC: 4.52 MIL/uL (ref 3.87–5.11)
RDW: 13.3 % (ref 11.5–15.5)
WBC: 6.1 10*3/uL (ref 4.0–10.5)

## 2016-07-20 SURGERY — OPEN REDUCTION INTERNAL FIXATION (ORIF) PROXIMAL HUMERUS FRACTURE
Anesthesia: Regional | Laterality: Right

## 2016-07-20 MED ORDER — VITAMIN D 1000 UNITS PO TABS
5000.0000 [IU] | ORAL_TABLET | Freq: Every day | ORAL | Status: DC
  Administered 2016-07-21 – 2016-07-23 (×3): 5000 [IU] via ORAL
  Filled 2016-07-20 (×3): qty 5

## 2016-07-20 MED ORDER — POLYVINYL ALCOHOL 1.4 % OP SOLN: Freq: Two times a day (BID) | OPHTHALMIC | Status: DC | PRN

## 2016-07-20 MED ORDER — CHLORHEXIDINE GLUCONATE 4 % EX LIQD: 60.0000 mL | Freq: Once | CUTANEOUS | Status: DC

## 2016-07-20 MED ORDER — BUPIVACAINE-EPINEPHRINE (PF) 0.25% -1:200000 IJ SOLN
INTRAMUSCULAR | Status: DC | PRN
  Administered 2016-07-20: 5 mL

## 2016-07-20 MED ORDER — PROGESTERONE MICRONIZED 100 MG PO CAPS
100.0000 mg | ORAL_CAPSULE | Freq: Every day | ORAL | Status: DC
  Administered 2016-07-20 – 2016-07-22 (×3): 100 mg via ORAL
  Filled 2016-07-20 (×3): qty 1

## 2016-07-20 MED ORDER — LOSARTAN POTASSIUM 50 MG PO TABS
50.0000 mg | ORAL_TABLET | Freq: Every morning | ORAL | Status: DC
  Administered 2016-07-21 – 2016-07-23 (×3): 50 mg via ORAL
  Filled 2016-07-20 (×3): qty 1

## 2016-07-20 MED ORDER — PROPOFOL 10 MG/ML IV BOLUS
INTRAVENOUS | Status: AC
Start: 2016-07-20 — End: 2016-07-20
  Filled 2016-07-20: qty 20

## 2016-07-20 MED ORDER — FENTANYL CITRATE (PF) 100 MCG/2ML IJ SOLN
INTRAMUSCULAR | Status: AC
  Filled 2016-07-20: qty 2

## 2016-07-20 MED ORDER — LACTATED RINGERS IV SOLN
INTRAVENOUS | Status: DC
  Administered 2016-07-20 (×3): via INTRAVENOUS

## 2016-07-20 MED ORDER — LIDOCAINE 2% (20 MG/ML) 5 ML SYRINGE
INTRAMUSCULAR | Status: AC
  Filled 2016-07-20: qty 5

## 2016-07-20 MED ORDER — CARBIDOPA-LEVODOPA 25-100 MG PO TABS: 2.0000 | ORAL_TABLET | Freq: Three times a day (TID) | ORAL | Status: DC

## 2016-07-20 MED ORDER — ACETAMINOPHEN 325 MG PO TABS: 650.0000 mg | ORAL_TABLET | Freq: Four times a day (QID) | ORAL | Status: DC | PRN

## 2016-07-20 MED ORDER — DOCUSATE SODIUM 100 MG PO CAPS: 100.0000 mg | ORAL_CAPSULE | Freq: Every day | ORAL | Status: DC | PRN

## 2016-07-20 MED ORDER — FENTANYL CITRATE (PF) 100 MCG/2ML IJ SOLN
INTRAMUSCULAR | Status: DC | PRN
  Administered 2016-07-20: 50 ug via INTRAVENOUS
  Administered 2016-07-20: 100 ug via INTRAVENOUS

## 2016-07-20 MED ORDER — ACETAMINOPHEN 650 MG RE SUPP: 650.0000 mg | Freq: Four times a day (QID) | RECTAL | Status: DC | PRN

## 2016-07-20 MED ORDER — DOCUSATE SODIUM 100 MG PO CAPS
100.0000 mg | ORAL_CAPSULE | Freq: Two times a day (BID) | ORAL | Status: DC
Start: 2016-07-20 — End: 2016-07-23
  Administered 2016-07-20 – 2016-07-23 (×6): 100 mg via ORAL
  Filled 2016-07-20 (×6): qty 1

## 2016-07-20 MED ORDER — VITAMIN B-12 1000 MCG PO TABS
500.0000 ug | ORAL_TABLET | Freq: Every day | ORAL | Status: DC
  Administered 2016-07-21 – 2016-07-23 (×3): 500 ug via ORAL
  Filled 2016-07-20 (×3): qty 1

## 2016-07-20 MED ORDER — AZELASTINE HCL 0.1 % NA SOLN
1.0000 | Freq: Two times a day (BID) | NASAL | Status: DC
  Administered 2016-07-21 – 2016-07-23 (×5): 1 via NASAL
  Filled 2016-07-20: qty 30

## 2016-07-20 MED ORDER — ONDANSETRON HCL 4 MG PO TABS: 4.0000 mg | ORAL_TABLET | Freq: Four times a day (QID) | ORAL | Status: DC | PRN

## 2016-07-20 MED ORDER — ASPIRIN EC 81 MG PO TBEC
81.0000 mg | DELAYED_RELEASE_TABLET | Freq: Every day | ORAL | Status: DC
  Administered 2016-07-21 – 2016-07-23 (×3): 81 mg via ORAL
  Filled 2016-07-20 (×3): qty 1

## 2016-07-20 MED ORDER — 0.9 % SODIUM CHLORIDE (POUR BTL) OPTIME
TOPICAL | Status: DC | PRN
  Administered 2016-07-20: 1000 mL

## 2016-07-20 MED ORDER — NYSTATIN 100000 UNIT/GM EX CREA: 1.0000 "application " | TOPICAL_CREAM | Freq: Two times a day (BID) | CUTANEOUS | Status: DC | PRN

## 2016-07-20 MED ORDER — FENOFIBRATE 160 MG PO TABS
160.0000 mg | ORAL_TABLET | Freq: Every morning | ORAL | Status: DC
  Administered 2016-07-21 – 2016-07-23 (×3): 160 mg via ORAL
  Filled 2016-07-20 (×3): qty 1

## 2016-07-20 MED ORDER — LORATADINE 10 MG PO TABS
10.0000 mg | ORAL_TABLET | Freq: Every morning | ORAL | Status: DC
  Administered 2016-07-21 – 2016-07-23 (×3): 10 mg via ORAL
  Filled 2016-07-20 (×3): qty 1

## 2016-07-20 MED ORDER — EPHEDRINE SULFATE 50 MG/ML IJ SOLN
INTRAMUSCULAR | Status: DC | PRN
  Administered 2016-07-20: 10 mg via INTRAVENOUS
  Administered 2016-07-20: 5 mg via INTRAVENOUS

## 2016-07-20 MED ORDER — POLYETHYLENE GLYCOL 3350 17 G PO PACK
17.0000 g | PACK | Freq: Every day | ORAL | Status: DC
  Administered 2016-07-21 – 2016-07-23 (×3): 17 g via ORAL
  Filled 2016-07-20 (×3): qty 1

## 2016-07-20 MED ORDER — BUPIVACAINE-EPINEPHRINE (PF) 0.5% -1:200000 IJ SOLN
INTRAMUSCULAR | Status: DC | PRN
  Administered 2016-07-20: 25 mL

## 2016-07-20 MED ORDER — METOCLOPRAMIDE HCL 5 MG/ML IJ SOLN: 5.0000 mg | Freq: Three times a day (TID) | INTRAMUSCULAR | Status: DC | PRN

## 2016-07-20 MED ORDER — MENTHOL (TOPICAL ANALGESIC) 4 % EX GEL: 1.0000 "application " | Freq: Two times a day (BID) | CUTANEOUS | Status: DC | PRN

## 2016-07-20 MED ORDER — MONTELUKAST SODIUM 10 MG PO TABS
10.0000 mg | ORAL_TABLET | Freq: Every day | ORAL | Status: DC
  Administered 2016-07-20 – 2016-07-22 (×3): 10 mg via ORAL
  Filled 2016-07-20 (×3): qty 1

## 2016-07-20 MED ORDER — MIDAZOLAM HCL 2 MG/2ML IJ SOLN
INTRAMUSCULAR | Status: AC
  Administered 2016-07-20: 1 mg
  Filled 2016-07-20: qty 2

## 2016-07-20 MED ORDER — BUPIVACAINE-EPINEPHRINE (PF) 0.25% -1:200000 IJ SOLN
INTRAMUSCULAR | Status: AC
  Filled 2016-07-20: qty 30

## 2016-07-20 MED ORDER — SODIUM CHLORIDE 0.9 % IV SOLN
INTRAVENOUS | Status: DC
  Administered 2016-07-20: 20:00:00 via INTRAVENOUS

## 2016-07-20 MED ORDER — PROPOFOL 10 MG/ML IV BOLUS
INTRAVENOUS | Status: DC | PRN
  Administered 2016-07-20: 200 mg via INTRAVENOUS

## 2016-07-20 MED ORDER — HYDROCODONE-ACETAMINOPHEN 5-325 MG PO TABS
1.0000 | ORAL_TABLET | ORAL | Status: DC | PRN
  Administered 2016-07-20 – 2016-07-23 (×12): 2 via ORAL
  Filled 2016-07-20 (×13): qty 2

## 2016-07-20 MED ORDER — METHOCARBAMOL 500 MG PO TABS
500.0000 mg | ORAL_TABLET | Freq: Four times a day (QID) | ORAL | Status: DC | PRN
  Administered 2016-07-21: 500 mg via ORAL
  Filled 2016-07-20: qty 1

## 2016-07-20 MED ORDER — CARBIDOPA-LEVODOPA ER 25-100 MG PO TBCR
1.0000 | EXTENDED_RELEASE_TABLET | Freq: Every day | ORAL | Status: DC
  Administered 2016-07-20 – 2016-07-22 (×3): 1 via ORAL
  Filled 2016-07-20 (×3): qty 1

## 2016-07-20 MED ORDER — ESCITALOPRAM OXALATE 10 MG PO TABS
5.0000 mg | ORAL_TABLET | Freq: Every day | ORAL | Status: DC
  Administered 2016-07-21 – 2016-07-23 (×3): 5 mg via ORAL
  Filled 2016-07-20 (×3): qty 1

## 2016-07-20 MED ORDER — FENTANYL CITRATE (PF) 100 MCG/2ML IJ SOLN
INTRAMUSCULAR | Status: AC
  Administered 2016-07-20: 50 ug
  Filled 2016-07-20: qty 2

## 2016-07-20 MED ORDER — SUCCINYLCHOLINE CHLORIDE 200 MG/10ML IV SOSY
PREFILLED_SYRINGE | INTRAVENOUS | Status: AC
  Filled 2016-07-20: qty 10

## 2016-07-20 MED ORDER — CEFAZOLIN SODIUM-DEXTROSE 2-4 GM/100ML-% IV SOLN
2.0000 g | Freq: Four times a day (QID) | INTRAVENOUS | Status: AC
  Administered 2016-07-20 – 2016-07-21 (×3): 2 g via INTRAVENOUS
  Filled 2016-07-20 (×3): qty 100

## 2016-07-20 MED ORDER — SUCCINYLCHOLINE CHLORIDE 20 MG/ML IJ SOLN
INTRAMUSCULAR | Status: DC | PRN
  Administered 2016-07-20: 120 mg via INTRAVENOUS

## 2016-07-20 MED ORDER — FLUTICASONE PROPIONATE 50 MCG/ACT NA SUSP
2.0000 | Freq: Every day | NASAL | Status: DC
  Administered 2016-07-21 – 2016-07-23 (×3): 2 via NASAL
  Filled 2016-07-20: qty 16

## 2016-07-20 MED ORDER — MENTHOL 3 MG MT LOZG: 1.0000 | LOZENGE | OROMUCOSAL | Status: DC | PRN

## 2016-07-20 MED ORDER — CARBIDOPA-LEVODOPA 25-100 MG PO TABS
2.0000 | ORAL_TABLET | Freq: Two times a day (BID) | ORAL | Status: DC
  Administered 2016-07-21 – 2016-07-23 (×6): 2 via ORAL
  Filled 2016-07-20 (×6): qty 2

## 2016-07-20 MED ORDER — FENTANYL CITRATE (PF) 100 MCG/2ML IJ SOLN: 25.0000 ug | INTRAMUSCULAR | Status: DC | PRN

## 2016-07-20 MED ORDER — MELATONIN 3 MG PO TABS
6.0000 mg | ORAL_TABLET | Freq: Every evening | ORAL | Status: DC | PRN
Start: 2016-07-20 — End: 2016-07-23
  Administered 2016-07-20 – 2016-07-21 (×2): 6 mg via ORAL
  Filled 2016-07-20 (×3): qty 2

## 2016-07-20 MED ORDER — PHENOL 1.4 % MT LIQD: 1.0000 | OROMUCOSAL | Status: DC | PRN

## 2016-07-20 MED ORDER — LIDOCAINE HCL (CARDIAC) 20 MG/ML IV SOLN
INTRAVENOUS | Status: DC | PRN
  Administered 2016-07-20: 100 mg via INTRAVENOUS

## 2016-07-20 MED ORDER — MORPHINE SULFATE (PF) 2 MG/ML IV SOLN
2.0000 mg | INTRAVENOUS | Status: DC | PRN
  Administered 2016-07-21: 2 mg via INTRAVENOUS
  Filled 2016-07-20: qty 1

## 2016-07-20 MED ORDER — ROCURONIUM BROMIDE 100 MG/10ML IV SOLN
INTRAVENOUS | Status: DC | PRN
  Administered 2016-07-20: 40 mg via INTRAVENOUS

## 2016-07-20 MED ORDER — PROMETHAZINE HCL 25 MG/ML IJ SOLN: 6.2500 mg | INTRAMUSCULAR | Status: DC | PRN

## 2016-07-20 MED ORDER — ONDANSETRON HCL 4 MG/2ML IJ SOLN: 4.0000 mg | Freq: Four times a day (QID) | INTRAMUSCULAR | Status: DC | PRN

## 2016-07-20 MED ORDER — DEXTROSE 5 % IV SOLN
500.0000 mg | Freq: Four times a day (QID) | INTRAVENOUS | Status: DC | PRN
  Filled 2016-07-20: qty 5

## 2016-07-20 MED ORDER — POLYETHYLENE GLYCOL 3350 17 G PO PACK: 17.0000 g | PACK | Freq: Every day | ORAL | Status: DC | PRN

## 2016-07-20 MED ORDER — METOCLOPRAMIDE HCL 5 MG PO TABS: 5.0000 mg | ORAL_TABLET | Freq: Three times a day (TID) | ORAL | Status: DC | PRN

## 2016-07-20 MED ORDER — SUGAMMADEX SODIUM 200 MG/2ML IV SOLN
INTRAVENOUS | Status: DC | PRN
  Administered 2016-07-20: 206.2 mg via INTRAVENOUS

## 2016-07-20 MED ORDER — PHENYLEPHRINE HCL 10 MG/ML IJ SOLN
INTRAVENOUS | Status: DC | PRN
  Administered 2016-07-20: 25 ug/min via INTRAVENOUS

## 2016-07-20 MED ORDER — METHOCARBAMOL 500 MG PO TABS
500.0000 mg | ORAL_TABLET | Freq: Four times a day (QID) | ORAL | Status: DC | PRN
  Administered 2016-07-20 – 2016-07-23 (×5): 500 mg via ORAL
  Filled 2016-07-20 (×7): qty 1

## 2016-07-20 SURGICAL SUPPLY — 63 items
AEQUALIS REVERSED II ×3 IMPLANT
BIT DRILL 3.0 (BIT) ×1 IMPLANT
BIT DRILL HEX (BIT) ×1 IMPLANT
BOWL SMART MIX CTS (DISPOSABLE) ×3 IMPLANT
CAP SHOULDER REVTOTAL 2 ×3 IMPLANT
CEMENT BONE DEPUY (Cement) ×3 IMPLANT
CEMENT RESTRICTOR ×2 IMPLANT
CLOSURE WOUND 1/2 X4 (GAUZE/BANDAGES/DRESSINGS) ×1
COVER SURGICAL LIGHT HANDLE (MISCELLANEOUS) ×3 IMPLANT
DRAPE C-ARM 42X72 X-RAY (DRAPES) ×3 IMPLANT
DRAPE IMP U-DRAPE 54X76 (DRAPES) ×3 IMPLANT
DRAPE INCISE IOBAN 66X45 STRL (DRAPES) ×3 IMPLANT
DRAPE ORTHO SPLIT 77X108 STRL (DRAPES) ×4
DRAPE SURG ORHT 6 SPLT 77X108 (DRAPES) ×2 IMPLANT
DRAPE U-SHAPE 47X51 STRL (DRAPES) ×3 IMPLANT
DRILL BIT 3.0 (BIT) ×2
DRILL BIT HEX (BIT) ×3
DRSG EMULSION OIL 3X3 NADH (GAUZE/BANDAGES/DRESSINGS) ×3 IMPLANT
DURAPREP 26ML APPLICATOR (WOUND CARE) ×3 IMPLANT
ELECT BLADE 4.0 EZ CLEAN MEGAD (MISCELLANEOUS) ×3
ELECT NEEDLE BLADE 2-5/6 (NEEDLE) ×6 IMPLANT
ELECT REM PT RETURN 9FT ADLT (ELECTROSURGICAL) ×3
ELECTRODE BLDE 4.0 EZ CLN MEGD (MISCELLANEOUS) ×1 IMPLANT
ELECTRODE REM PT RTRN 9FT ADLT (ELECTROSURGICAL) ×1 IMPLANT
GAUZE SPONGE 4X4 12PLY STRL (GAUZE/BANDAGES/DRESSINGS) ×3 IMPLANT
GLOVE BIOGEL PI ORTHO PRO 7.5 (GLOVE) ×2
GLOVE BIOGEL PI ORTHO PRO SZ8 (GLOVE) ×2
GLOVE ORTHO TXT STRL SZ7.5 (GLOVE) ×3 IMPLANT
GLOVE PI ORTHO PRO STRL 7.5 (GLOVE) ×1 IMPLANT
GLOVE PI ORTHO PRO STRL SZ8 (GLOVE) ×1 IMPLANT
GLOVE SURG ORTHO 8.5 STRL (GLOVE) ×3 IMPLANT
GOWN STRL REUS W/ TWL XL LVL3 (GOWN DISPOSABLE) ×2 IMPLANT
GOWN STRL REUS W/TWL XL LVL3 (GOWN DISPOSABLE) ×4
KIT BASIN OR (CUSTOM PROCEDURE TRAY) ×3 IMPLANT
KIT ROOM TURNOVER OR (KITS) ×3 IMPLANT
MANIFOLD NEPTUNE II (INSTRUMENTS) ×3 IMPLANT
NEEDLE HYPO 25GX1X1/2 BEV (NEEDLE) ×3 IMPLANT
NS IRRIG 1000ML POUR BTL (IV SOLUTION) ×3 IMPLANT
PACK SHOULDER (CUSTOM PROCEDURE TRAY) ×3 IMPLANT
PAD ABD 8X10 STRL (GAUZE/BANDAGES/DRESSINGS) ×3 IMPLANT
PAD ARMBOARD 7.5X6 YLW CONV (MISCELLANEOUS) ×6 IMPLANT
PIN GUIDE 2.5X200 (PIN) ×3 IMPLANT
SLING ARM FOAM STRAP LRG (SOFTGOODS) ×6 IMPLANT
SLING ULTRA II LARGE (SOFTGOODS) ×3 IMPLANT
SPONGE GAUZE 4X4 12PLY STER LF (GAUZE/BANDAGES/DRESSINGS) ×3 IMPLANT
SPONGE LAP 4X18 X RAY DECT (DISPOSABLE) ×6 IMPLANT
STRIP CLOSURE SKIN 1/2X4 (GAUZE/BANDAGES/DRESSINGS) ×2 IMPLANT
SUCTION FRAZIER HANDLE 10FR (MISCELLANEOUS) ×2
SUCTION TUBE FRAZIER 10FR DISP (MISCELLANEOUS) ×1 IMPLANT
SUT FIBERWIRE #2 38 T-5 BLUE (SUTURE) ×15
SUT MNCRL AB 4-0 PS2 18 (SUTURE) ×6 IMPLANT
SUT VIC AB 0 CT1 27 (SUTURE) ×6
SUT VIC AB 0 CT1 27XBRD ANBCTR (SUTURE) ×3 IMPLANT
SUT VIC AB 0 CT2 27 (SUTURE) ×3 IMPLANT
SUT VIC AB 2-0 CT1 27 (SUTURE) ×4
SUT VIC AB 2-0 CT1 TAPERPNT 27 (SUTURE) ×2 IMPLANT
SUTURE FIBERWR #2 38 T-5 BLUE (SUTURE) ×5 IMPLANT
SYR CONTROL 10ML LL (SYRINGE) ×3 IMPLANT
TAPE CLOTH SURG 6X10 WHT LF (GAUZE/BANDAGES/DRESSINGS) ×3 IMPLANT
TAPE STRIPS DRAPE STRL (GAUZE/BANDAGES/DRESSINGS) ×3 IMPLANT
TOWEL OR 17X24 6PK STRL BLUE (TOWEL DISPOSABLE) ×3 IMPLANT
TOWEL OR 17X26 10 PK STRL BLUE (TOWEL DISPOSABLE) ×3 IMPLANT
WATER STERILE IRR 1000ML POUR (IV SOLUTION) ×3 IMPLANT

## 2016-07-20 NOTE — Anesthesia Procedure Notes (Signed)
Anesthesia Regional Block:  Interscalene brachial plexus block  Pre-Anesthetic Checklist: ,, timeout performed, Correct Patient, Correct Site, Correct Laterality, Correct Procedure, Correct Position, site marked, Risks and benefits discussed,  Surgical consent,  Pre-op evaluation,  At surgeon's request and post-op pain management  Laterality: Right  Prep: chloraprep       Needles:  Injection technique: Single-shot  Needle Type: Echogenic Needle     Needle Length: 5cm 5 cm Needle Gauge: 22 and 22 G    Additional Needles:  Procedures: ultrasound guided (picture in chart) Interscalene brachial plexus block Narrative:  Start time: 07/20/2016 3:18 PM End time: 07/20/2016 3:20 PM Injection made incrementally with aspirations every 25 mL.  Performed by: Personally  Anesthesiologist: Reginal Lutes

## 2016-07-20 NOTE — Transfer of Care (Signed)
Immediate Anesthesia Transfer of Care Note  Patient: [name] [name]  Procedure(s) Performed: Procedure(s): OPEN REDUCTION INTERNAL FIXATION (ORIF) PROXIMAL HUMERUS FRACTURE VS REVERSE TOTAL SHOULDER ARTHROPLASTY (Right)  Patient Location: PACU  Anesthesia Type:GA combined with regional for post-op pain  Level of Consciousness: awake, alert  and oriented  Airway & Oxygen Therapy: Patient Spontanous Breathing and Patient connected to nasal cannula oxygen  Post-op Assessment: Report given to RN and Post -op Vital signs reviewed and stable  Post vital signs: Reviewed and stable  Last Vitals:  Vitals:   07/20/16 1427 07/20/16 1838  BP: (!) 147/50   Pulse: (!) 50   Resp: 18   Temp: 37.1 C 36.7 C    Last Pain:  Vitals:   07/20/16 1427  TempSrc: Oral         Complications: No apparent anesthesia complications

## 2016-07-20 NOTE — Interval H&P Note (Signed)
History and Physical Interval Note:  07/20/2016 2:57 PM  [name] [name]  has presented today for surgery, with the diagnosis of [place]  The various methods of treatment have been discussed with the patient and family. After consideration of risks, benefits and other options for treatment, the patient has consented to  Procedure(s): OPEN REDUCTION INTERNAL FIXATION (ORIF) PROXIMAL HUMERUS FRACTURE VS REVERSE TOTAL SHOULDER ARTHROPLASTY (Right) as a surgical intervention .  The patient's history has been reviewed, patient examined, no change in status, stable for surgery.  I have reviewed the patient's chart and labs.  Questions were answered to the patient's satisfaction.     [name] [name],STEVEN R

## 2016-07-20 NOTE — Brief Op Note (Signed)
07/20/2016  6:25 PM  PATIENT:  [name] [name]  65 y.o. female  PRE-OPERATIVE DIAGNOSIS:  RIGHT DISPLACED PROXIMAL HUMERUS FRACTURE  POST-OPERATIVE DIAGNOSIS:  RIGHT DISPLACED PROXIMAL HUMERUS FRACTURE  PROCEDURE:  RIGHT SHOULDER REVERSE TOTAL SHOULDER REPLACEMENT WITH TUBEROSITY REPAIR  SURGEON:  Surgeon(s) and Role:    * Netta Cedars, MD - Primary  PHYSICIAN ASSISTANT:   ASSISTANTS: Ventura Bruns, PA-C   ANESTHESIA:   regional and general  EBL:  Total I/O In: 1500 [I.V.:1500] Out: 150 [Blood:150]  BLOOD ADMINISTERED:none  DRAINS: none   LOCAL MEDICATIONS USED:  MARCAINE     SPECIMEN:  No Specimen  DISPOSITION OF SPECIMEN:  N/A  COUNTS:  YES  TOURNIQUET:  * No tourniquets in log *  DICTATION: .Other Dictation: Dictation Number [id number]  PLAN OF CARE: Admit to inpatient   PATIENT DISPOSITION:  PACU - hemodynamically stable.   Delay start of Pharmacological VTE agent (>24hrs) due to surgical blood loss or risk of bleeding: not applicable

## 2016-07-20 NOTE — Anesthesia Postprocedure Evaluation (Signed)
Anesthesia Post Note  Patient: [name] [name]  Procedure(s) Performed: Procedure(s) (LRB): OPEN REDUCTION INTERNAL FIXATION (ORIF) PROXIMAL HUMERUS FRACTURE VS REVERSE TOTAL SHOULDER ARTHROPLASTY (Right)  Patient location during evaluation: PACU Anesthesia Type: General and Regional Level of consciousness: awake and alert Pain management: pain level controlled Vital Signs Assessment: post-procedure vital signs reviewed and stable Respiratory status: spontaneous breathing, nonlabored ventilation, respiratory function stable and patient connected to nasal cannula oxygen Cardiovascular status: blood pressure returned to baseline and stable Postop Assessment: no signs of nausea or vomiting Anesthetic complications: no    Last Vitals:  Vitals:   07/20/16 1925 07/20/16 1940  BP: 125/67 123/61  Pulse: (!) 104 (!) 103  Resp: 20 20  Temp:      Last Pain:  Vitals:   07/20/16 1940  TempSrc:   PainSc: 0-No pain                 Effie Berkshire

## 2016-07-20 NOTE — Anesthesia Procedure Notes (Signed)
Procedure Name: Intubation Date/Time: 07/20/2016 3:43 PM Performed by: Rebekah Chesterfield L Pre-anesthesia Checklist: Patient identified, Emergency Drugs available, Suction available and Patient being monitored Patient Re-evaluated:Patient Re-evaluated prior to inductionOxygen Delivery Method: Circle System Utilized Preoxygenation: Pre-oxygenation with 100% oxygen Intubation Type: IV induction Ventilation: Mask ventilation without difficulty Laryngoscope Size: Mac and 3 Grade View: Grade II Tube type: Oral Tube size: 7.0 mm Number of attempts: 1 Airway Equipment and Method: Stylet Placement Confirmation: ETT inserted through vocal cords under direct vision,  positive ETCO2 and breath sounds checked- equal and bilateral Secured at: 20 cm Tube secured with: Tape Dental Injury: Teeth and Oropharynx as per pre-operative assessment

## 2016-07-20 NOTE — Progress Notes (Signed)
Pt. Brought CPAP mask with her, she uses CPAP q night, setting -4, pt. Is almost certain.

## 2016-07-20 NOTE — Progress Notes (Signed)
Pt. Told to arrive asap, pt. Is waiting on her son to pick her up.

## 2016-07-20 NOTE — Anesthesia Preprocedure Evaluation (Signed)
Anesthesia Evaluation  Patient identified by MRN, date of birth, ID band Patient awake    Reviewed: Allergy & Precautions, NPO status , Patient's Chart, lab work & pertinent test results  History of Anesthesia Complications Negative for: history of anesthetic complications  Airway Mallampati: II  TM Distance: >3 FB Neck ROM: Full    Dental no notable dental hx. (+) Dental Advisory Given, Poor Dentition   Pulmonary shortness of breath, sleep apnea and Continuous Positive Airway Pressure Ventilation ,    Pulmonary exam normal breath sounds clear to auscultation       Cardiovascular hypertension, Pt. on medications Normal cardiovascular exam Rhythm:Regular Rate:Normal     Neuro/Psych PSYCHIATRIC DISORDERS Anxiety Depression  Neuromuscular disease negative neurological ROS     GI/Hepatic negative GI ROS, Neg liver ROS,   Endo/Other  diabetes, Type 2, Oral Hypoglycemic AgentsMorbid obesity  Renal/GU negative Renal ROS  negative genitourinary   Musculoskeletal  (+) Arthritis , Osteoarthritis,    Abdominal   Peds negative pediatric ROS (+)  Hematology negative hematology ROS (+)   Anesthesia Other Findings   Reproductive/Obstetrics negative OB ROS                             Anesthesia Physical  Anesthesia Plan  ASA: III  Anesthesia Plan: General and Regional   Post-op Pain Management:  Regional for Post-op pain   Induction: Intravenous  Airway Management Planned: Oral ETT  Additional Equipment: None  Intra-op Plan:   Post-operative Plan: Extubation in OR  Informed Consent: I have reviewed the patients History and Physical, chart, labs and discussed the procedure including the risks, benefits and alternatives for the proposed anesthesia with the patient or authorized representative who has indicated his/her understanding and acceptance.   Dental advisory given  Plan Discussed  with: CRNA and Surgeon  Anesthesia Plan Comments:         Anesthesia Quick Evaluation

## 2016-07-21 LAB — BASIC METABOLIC PANEL
ANION GAP: 7 (ref 5–15)
BUN: 11 mg/dL (ref 6–20)
CALCIUM: 9 mg/dL (ref 8.9–10.3)
CO2: 27 mmol/L (ref 22–32)
Chloride: 108 mmol/L (ref 101–111)
Creatinine, Ser: 0.58 mg/dL (ref 0.44–1.00)
GFR calc Af Amer: >60 mL/min (ref >60)
GFR calc non Af Amer: >60 mL/min (ref >60)
GLUCOSE: 138 mg/dL — AB (ref 65–99)
Potassium: 3.5 mmol/L (ref 3.5–5.1)
Sodium: 142 mmol/L (ref 135–145)

## 2016-07-21 LAB — HEMOGLOBIN AND HEMATOCRIT, BLOOD
HCT: 37.4 % (ref 36.0–46.0)
HEMOGLOBIN: 11.7 g/dL — AB (ref 12.0–15.0)

## 2016-07-21 LAB — GLUCOSE, CAPILLARY
Glucose-Capillary: 135 mg/dL — ABNORMAL HIGH (ref 65–99)
Glucose-Capillary: 163 mg/dL — ABNORMAL HIGH (ref 65–99)

## 2016-07-21 NOTE — Progress Notes (Signed)
Pt. placed on 4 cm h20 per home regimen CPAP for h/s with her Nasal Pillows with 3 lpm Oxygen added to circuit, humidity filled, tolerating well, RN made aware.

## 2016-07-21 NOTE — Progress Notes (Addendum)
     Subjective: 1 Day Post-Op Procedure(s) (LRB): OPEN REDUCTION INTERNAL FIXATION (ORIF) PROXIMAL HUMERUS FRACTURE VS REVERSE TOTAL SHOULDER ARTHROPLASTY (Right)   Patient reports pain as moderate, pain not fully controlled.  No reported events throughout the night.  Currently using a bed pan for BM.  Planning on discharge to skilled nursing facility.  Objective:   VITALS:   Vitals:   07/20/16 2338 07/21/16 0354  BP: 124/71 (!) 120/50  Pulse: (!) 110 92  Resp: 18 18  Temp: 97.9 F (36.6 C) 97.9 F (36.6 C)    Dorsiflexion/Plantar flexion intact Incision: dressing C/D/I No cellulitis present Compartment soft  LABS  Recent Labs  07/20/16 1449 07/21/16 0306  HGB 13.1 11.7*  HCT 41.4 37.4  WBC 6.1  --   PLT 306  --      Recent Labs  07/20/16 1449 07/21/16 0306  NA 140 142  K 4.1 3.5  BUN 13 11  CREATININE 0.55 0.58  GLUCOSE 118* 138*     Assessment/Plan: 1 Day Post-Op Procedure(s) (LRB): OPEN REDUCTION INTERNAL FIXATION (ORIF) PROXIMAL HUMERUS FRACTURE VS REVERSE TOTAL SHOULDER ARTHROPLASTY (Right)   Up with therapy Discharge to SNF eventually, when ready.   West Pugh Babish   PAC  07/21/2016, 9:46 AM   Agree with above assessment and plan.  PT, OT, mobilization SNF placement pending.   Dr Veverly Fells

## 2016-07-21 NOTE — Op Note (Signed)
NAMEKAEDYNCE, HOE             ACCOUNT NO.:  [id number]  MEDICAL RECORD NO.:  [id number]  LOCATION:  5N04C                        FACILITY:  [facility]  PHYSICIAN:  Doran Heater. Veverly Fells, M.D. DATE OF BIRTH:  01/24/1951  DATE OF PROCEDURE:  07/20/2016 DATE OF DISCHARGE:                              OPERATIVE REPORT   PREOPERATIVE DIAGNOSIS:  Displaced and comminuted right proximal humerus fracture.  POSTOPERATIVE DIAGNOSES:  Displaced and comminuted right proximal humerus fracture.  PROCEDURE PERFORMED:  Right shoulder reverse total shoulder arthroplasty using Tornier system with fracture stem and we also did tuberosity repair.  ATTENDING SURGEON:  Doran Heater. Veverly Fells, M.D.  ASSISTANT:  Abbott Pao. Dixon, PA-C, was scrubbed the entire procedure and was necessary for satisfactory completion of surgery.  ANESTHESIA:  General anesthesia was used plus interscalene block.  ESTIMATED BLOOD LOSS:  150 mL.  FLUID REPLACEMENT:  1200 mL crystalloid.  INSTRUMENT COUNTS:  Correct.  COMPLICATIONS:  There were no complications.  ANTIBIOTICS:  Perioperative antibiotics were given.  INDICATIONS:  The patient is a 65 year old female, who suffered a ground- level fall injuring her right shoulder.  The patient presented initially to the hospital.  X-rays obtained demonstrating a proximal humerus fracture that had acceptable alignment.  She presented to the orthopedic office for 1 week followup, and there was noted to be 100% displacement of humeral shaft out from under the humeral head, and the patient also had displaced greater tuberosity fracture.  There was also noted to be comminution at the fracture site after the fracture displaced.  Based on the patient's diagnosis of osteoporosis, comminuted fracture of the proximal humerus, and worry over obtaining adequate fixation with an ORIF, we planned and discussed with the patient the recommendation for reverse total shoulder arthroplasty.  Risks  and benefits discussed. Informed consent obtained.  DESCRIPTION OF PROCEDURE:  After an adequate level of anesthesia achieved, the patient was positioned in the modified beach-chair position.  Right shoulder was correctly identified, sterilely prepped and draped in usual manner.  Time-out was called.  We entered the shoulder in standard deltopectoral approach starting at the coracoid process extending down to the anterior humerus.  We identified cephalic vein, took it laterally with the deltoid, pectoralis taken medially. Conjoint tendon identified and retracted medially.  We identified the fractured humerus, noted displacement at the fracture site and comminution.  We did go ahead and free up the humeral head from the humeral shaft so we could identify those parts.  We then identified the bicipital groove on the proximal fragment, osteotomized the lesser tuberosity away from that.  Placed #2 FiberWire suture in a modified Mason-Allen suture technique medial to the lesser tuberosity.  We then used the osteotome to get the greater tuberosity free from the humeral head and removed the humeral head.  At this point, the greater tuberosity was debulked.  We placed #2 FiberWire suture in a mattress fashion to the lateral side of the greater tuberosity for repair.  Next, we went ahead and prepared the glenoid.  We removed cartilage and labrum as such we could get good visualization and we selected a size 23 base plate.  We then went ahead and drilled our central peg or  guide pin for and this is a __________.  We drilled our guide pin.  We then went ahead and reamed for the 23 base plate, and then did our peripheral hand reaming and then drilled our central peg hole and impacted the base plate into position.  Next, we went ahead and placed our anterior and posterior screws, which were compression screws, 28 posteriorly, 23 anteriorly, and then were able to get a 38 inferior screw, which  was locked into the base plate, and then a 23 proximally into the base of the coracoid.  We had good purchase with all screws and excellent security of the base plate.  We then selected a 38 standard glenoid sphere and screwed that into position and impacted it.  Once that was stable, we directed our attention toward the humeral shaft.  We reamed up to a size 9, could not get the 11 reamer down, we then selected a 9 trial implant and this is __________ fracture stem.  Once we had that impact in position in 0 degrees of eversion.  We took 38+ 3 trial poly and put that onto the humeral component and reduced the shoulder.  We could get the tuberosities reduced into good position.  We removed the trial component, we then irrigated and cleaned the canal and then dried it.  We then cemented the real implant in place.  I drilled a hole in the humerus and passed multiple #2 FiberWire sutures and also took a suture through the medial fin in the hole that was present there for the fracture stem.  We also used a punch and punched out bone graft to fit in the window in the lateral portion of the stem that will facilitate tuberosity to tuberosity healing.  __________ used the humeral head and extensively bone graft of the implant.  Once the cement was hardened, we trialed again with a +6, __________ +9 and then reduced the shoulder in a position.  We did pass our tuberosity sutures first around the __________ stitch medial to the lesser tuberosity and lateral to the greater tuberosity.  We bone grafted really well with the tuberosities could not see.  It was noted the patient had a full-thickness supraspinatus tear, but the infraspinatus tear is minor, appeared normal.  So we brought that portion tuberosity around and then the lesser around to an anatomic position and then compressed the bone graft into place.  We tied tuberosities to each other with the tuberosity sutures.  We also used the sutures  through the shaft to tie the tuberosities to the shaft.  Then finally __________ to compress that. This did not restrict the patient's range of motion.  We did a full passive motion after that and everything stayed together as a unit.  It was quite stable.  We irrigated thoroughly and then closed the deltopectoral interval with 0 Vicryl suture followed by 2-0 Vicryl subcutaneous closure and 4-0 Monocryl for skin.  Steri-Strips applied followed by sterile dressing.  The patient tolerated the surgery well.     Doran Heater. Veverly Fells, M.D.     SRN/MEDQ  D:  07/20/2016  T:  07/21/2016  Job:  [id number]

## 2016-07-21 NOTE — Evaluation (Signed)
Occupational Therapy Evaluation Patient Details Name: [name] [name] MRN: [id number] DOB: [birth year] Today's Date: 07/21/2016    History of Present Illness 65 y.o. s/p RIGHT SHOULDER REVERSE TOTAL SHOULDER REPLACEMENT WITH TUBEROSITY REPAIR. PMH includes HTN, depression, HLD, polyneuropathy, arthritis, SOB-dyspnea, multiple system atrophy, DM, HOH. ACDF C4-18 Mar 2016.   Clinical Impression   Pt admitted with the above diagnoses and presents with below problem list. Pt will benefit from continued acute OT to address the below listed deficits and maximize independence with basic ADLs prior to d/c to venue below. PTA needing assist with mobility, transfers, and bathing/dressing. Pt is currently max to total A with ADLs at bed level. Session details below.         Follow Up Recommendations  SNF    Equipment Recommendations  Other (comment) (defer to next venue)    Recommendations for Other Services PT consult     Precautions / Restrictions Precautions Precautions: Shoulder;Fall Type of Shoulder Precautions: conservative Shoulder Interventions: Shoulder sling/immobilizer;At all times;Off for dressing/bathing/exercises Precaution Booklet Issued: Yes (comment) Required Braces or Orthoses: Sling Restrictions Weight Bearing Restrictions: Yes RUE Weight Bearing: Non weight bearing      Mobility Bed Mobility Overal bed mobility: +2 for physical assistance             General bed mobility comments: +2 to scoot up. able to bridge for bed pan placement. lift chair out of recliner PTA.   Transfers                 General transfer comment: not attempted. lift chair from recliner at baseline. rollator with assistance at baseline.     Balance                                            ADL Overall ADL's : Needs assistance/impaired Eating/Feeding: Minimal assistance   Grooming: Moderate assistance;Bed level   Upper Body Bathing: Maximal  assistance;Bed level   Lower Body Bathing: Total assistance;Bed level   Upper Body Dressing : Maximal assistance;Bed level   Lower Body Dressing: Total assistance;Bed level                 General ADL Comments: Pt able to bridge in bed for positioning of bed pan. +2 to scoot up in bed.  Sling, ADL and exercises education provided following conservative protocol. Pt noted to have sling off upon therapist arrival. Educated on sling wearing schedule and donned sling.      Vision     Perception     Praxis      Pertinent Vitals/Pain Pain Assessment: Faces Faces Pain Scale: Hurts little more Pain Location: R shoulder Pain Descriptors / Indicators: Aching;Sore Pain Intervention(s): Limited activity within patient's tolerance;Monitored during session;Repositioned     Hand Dominance Left   Extremity/Trunk Assessment Upper Extremity Assessment Upper Extremity Assessment: RUE deficits/detail RUE Deficits / Details: s/p RIGHT SHOULDER REVERSE TOTAL SHOULDER REPLACEMENT WITH TUBEROSITY REPAIR           Communication Communication Communication: No difficulties   Cognition Arousal/Alertness: Awake/alert Behavior During Therapy: WFL for tasks assessed/performed Overall Cognitive Status: Within Functional Limits for tasks assessed                     General Comments       Exercises Exercises: Shoulder     Shoulder Instructions Shoulder Instructions Donning/doffing sling/immobilizer: Moderate  assistance Correct positioning of sling/immobilizer: Supervision/safety ROM for elbow, wrist and digits of operated UE: Min-guard Sling wearing schedule (on at all times/off for ADL's): Supervision/safety Positioning of UE while sleeping: [place] expects to be discharged to:: Skilled nursing facility                                        Prior Functioning/Environment Level of Independence: Needs assistance   Gait / Transfers Assistance Needed: rollator short distances with assistance or w/c ADL's / Homemaking Assistance Needed: assist with bathing/dressing; lift chair for transfers        OT Diagnosis: Acute pain;Generalized weakness   OT Problem List: Impaired balance (sitting and/or standing);Decreased range of motion;Decreased knowledge of use of DME or AE;Decreased knowledge of precautions;Obesity;Impaired UE functional use;Pain   OT Treatment/Interventions: Self-care/ADL training;Therapeutic exercise;DME and/or AE instruction;Therapeutic activities;Patient/family education;Balance training    OT Goals(Current goals can be found in the care plan section) Acute Rehab OT Goals Patient Stated Goal: SNF for rehab at d/c OT Goal Formulation: With patient Time For Goal Achievement: 07/28/16 Potential to Achieve Goals: Good ADL Goals Pt Will Perform Grooming: with min assist;bed level Pt Will Perform Upper Body Bathing: bed level;with mod assist Pt Will Perform Upper Body Dressing: with mod assist;bed level Pt/caregiver will Perform Home Exercise Program: With written HEP provided;Right Upper extremity;With Supervision (elbow/wrist/hand) Additional ADL Goal #1: Pt will be mod I with with sling wearing schedule.   OT Frequency: Min 2X/week   Barriers to D/C:            Co-evaluation              End of Session Equipment Utilized During Treatment: Other (comment) (sling) Nurse Communication: Other (comment) (NT assisting during session)  Activity Tolerance: Patient tolerated treatment well Patient left: in bed;with call bell/phone within reach;with nursing/sitter in room   Time: [id number] OT Time Calculation (min): 19 min Charges:  OT General Charges $OT Visit: 1 Procedure OT Evaluation $OT Eval Moderate Complexity: 1 Procedure G-Codes:    Hortencia Pilar 07-Aug-2016, 1:55 PM

## 2016-07-22 ENCOUNTER — Encounter (HOSPITAL_COMMUNITY): Payer: Self-pay | Admitting: *Deleted

## 2016-07-22 LAB — GLUCOSE, CAPILLARY
GLUCOSE-CAPILLARY: 161 mg/dL — AB (ref 65–99)
GLUCOSE-CAPILLARY: 160 mg/dL — AB (ref 65–99)
GLUCOSE-CAPILLARY: 235 mg/dL — AB (ref 65–99)
Glucose-Capillary: 169 mg/dL — ABNORMAL HIGH (ref 65–99)

## 2016-07-22 NOTE — Progress Notes (Signed)
Pt. placed on CPAP for h/s, Tolerating well

## 2016-07-22 NOTE — Progress Notes (Signed)
   Subjective: 2 Days Post-Op Procedure(s) (LRB): OPEN REDUCTION INTERNAL FIXATION (ORIF) PROXIMAL HUMERUS FRACTURE VS REVERSE TOTAL SHOULDER ARTHROPLASTY (Right)  Pt c/o mild pain in the shoulder Plan for SNF placement probably tomorrow Denies any new symptoms or issues Hard to get out of bed and mobilize Patient reports pain as mild.  Objective:   VITALS:   Vitals:   07/21/16 2006 07/22/16 0342  BP: 128/72 130/65  Pulse: (!) 102 84  Resp: 18 18  Temp: 99 F (37.2 C) 98.1 F (36.7 C)    Right shoulder incision healing well nv intact distally No rashes or edema distally  LABS  Recent Labs  07/20/16 1449 07/21/16 0306  HGB 13.1 11.7*  HCT 41.4 37.4  WBC 6.1  --   PLT 306  --      Recent Labs  07/20/16 1449 07/21/16 0306  NA 140 142  K 4.1 3.5  BUN 13 11  CREATININE 0.55 0.58  GLUCOSE 118* 138*     Assessment/Plan: 2 Days Post-Op Procedure(s) (LRB): OPEN REDUCTION INTERNAL FIXATION (ORIF) PROXIMAL HUMERUS FRACTURE VS REVERSE TOTAL SHOULDER ARTHROPLASTY (Right) Continue PT/OT Plan for SNF placement once bed available, probably tomorrow Pain management Pulmonary toilet  Merla Riches, MPAS, PA-C  07/22/2016, 7:50 AM

## 2016-07-23 LAB — GLUCOSE, CAPILLARY
GLUCOSE-CAPILLARY: 157 mg/dL — AB (ref 65–99)
Glucose-Capillary: 166 mg/dL — ABNORMAL HIGH (ref 65–99)

## 2016-07-23 MED ORDER — HYDROCORTISONE 0.5 % EX CREA
TOPICAL_CREAM | Freq: Two times a day (BID) | CUTANEOUS | Status: DC | PRN
  Administered 2016-07-23: 14:00:00 via TOPICAL
  Filled 2016-07-23: qty 28.35

## 2016-07-23 NOTE — Discharge Summary (Signed)
Physician Discharge Summary   Patient ID: [name] [name] MRN: [id number] DOB/AGE: Mar 30, 1951 65 y.o.  Admit date: 07/20/2016 Discharge date: 07/23/2016  Admission Diagnoses:  Active Problems:   S/P shoulder replacement   Discharge Diagnoses:  Same   Surgeries: Procedure(s): OPEN REDUCTION INTERNAL FIXATION (ORIF) PROXIMAL HUMERUS FRACTURE VS REVERSE TOTAL SHOULDER ARTHROPLASTY on 07/20/2016   Consultants: PT/OT, social work  Discharged Condition: Stable  Hospital Course: [name] [name] is an 65 y.o. female who was admitted 07/20/2016 with a chief complaint of right shoulder pain, and found to have a diagnosis of right rotator cuff arthropathy.  They were brought to the operating room on 07/20/2016 and underwent the above named procedures.    The patient had an uncomplicated hospital course and was stable for discharge.  Recent vital signs:  Vitals:   07/22/16 2318 07/23/16 0420  BP:  (!) 112/57  Pulse: 88 (!) 59  Resp:  18  Temp:  97.5 F (36.4 C)    Recent laboratory studies:  Results for orders placed or performed during the hospital encounter of [id number]  Basic metabolic panel  Result Value Ref Range   Sodium 140 135 - 145 mmol/L   Potassium 4.1 3.5 - 5.1 mmol/L   Chloride 106 101 - 111 mmol/L   CO2 25 22 - 32 mmol/L   Glucose, Bld 118 (H) 65 - 99 mg/dL   BUN 13 6 - 20 mg/dL   Creatinine, Ser 0.55 0.44 - 1.00 mg/dL   Calcium 9.8 8.9 - 10.3 mg/dL   GFR calc non Af Amer >60 >60 mL/min   GFR calc Af Amer >60 >60 mL/min   Anion gap 9 5 - 15  CBC  Result Value Ref Range   WBC 6.1 4.0 - 10.5 K/uL   RBC 4.52 3.87 - 5.11 MIL/uL   Hemoglobin 13.1 12.0 - 15.0 g/dL   HCT 41.4 36.0 - 46.0 %   MCV 91.6 78.0 - 100.0 fL   MCH 29.0 26.0 - 34.0 pg   MCHC 31.6 30.0 - 36.0 g/dL   RDW 13.3 11.5 - 15.5 %   Platelets 306 150 - 400 K/uL  Glucose, capillary  Result Value Ref Range   Glucose-Capillary 114 (H) 65 - 99 mg/dL  Glucose, capillary  Result Value Ref Range   Glucose-Capillary 128 (H) 65 - 99 mg/dL  Glucose, capillary  Result Value Ref Range   Glucose-Capillary 134 (H) 65 - 99 mg/dL  Hemoglobin and hematocrit, blood  Result Value Ref Range   Hemoglobin 11.7 (L) 12.0 - 15.0 g/dL   HCT 37.4 36.0 - [id number] %  Basic metabolic panel  Result Value Ref Range   Sodium 142 135 - 145 mmol/L   Potassium 3.5 3.5 - 5.1 mmol/L   Chloride 108 101 - 111 mmol/L   CO2 27 22 - 32 mmol/L   Glucose, Bld 138 (H) 65 - 99 mg/dL   BUN 11 6 - 20 mg/dL   Creatinine, Ser 0.58 0.44 - 1.00 mg/dL   Calcium 9.0 8.9 - 10.3 mg/dL   GFR calc non Af Amer >60 >60 mL/min   GFR calc Af Amer >60 >60 mL/min   Anion gap 7 5 - 15  Glucose, capillary  Result Value Ref Range   Glucose-Capillary 135 (H) 65 - 99 mg/dL  Glucose, capillary  Result Value Ref Range   Glucose-Capillary 163 (H) 65 - 99 mg/dL  Glucose, capillary  Result Value Ref Range   Glucose-Capillary 161 (H) 65 - 99 mg/dL  Glucose, capillary  Result Value Ref Range   Glucose-Capillary 160 (H) 65 - 99 mg/dL  Glucose, capillary  Result Value Ref Range   Glucose-Capillary 235 (H) 65 - 99 mg/dL  Glucose, capillary  Result Value Ref Range   Glucose-Capillary 169 (H) 65 - 99 mg/dL  Glucose, capillary  Result Value Ref Range   Glucose-Capillary 157 (H) 65 - 99 mg/dL  Glucose, capillary  Result Value Ref Range   Glucose-Capillary 166 (H) 65 - 99 mg/dL    Discharge Medications:  Medication Sig Dispense Refill  . acetaminophen (TYLENOL) 650 MG CR tablet Take 650-1,300 mg by mouth every 8 (eight) hours as needed for pain.     Marland Kitchen aspirin 81 MG tablet Take 81 mg by mouth daily.    Marland Kitchen azelastine (ASTELIN) 0.1 % nasal spray Place 1 spray into both nostrils 2 (two) times daily.   11  . carbidopa-levodopa (SINEMET IR) 25-100 MG tablet 2 tablets in the morning. 2 tablets at lunch. 1 tablet at bedtime. 450 tablet 0  . Cholecalciferol (VITAMIN D PO) Take 5,000 Units by mouth daily.    Marland Kitchen docusate sodium (COLACE) 100  MG capsule Take 100 mg by mouth daily as needed for mild constipation.     Marland Kitchen escitalopram (LEXAPRO) 10 MG tablet Take 5 mg by mouth daily.  2  . fenofibrate 160 MG tablet Take 160 mg by mouth every morning.     . fluticasone (FLONASE) 50 MCG/ACT nasal spray Place 2 sprays into both nostrils daily.   11  . HYDROcodone-acetaminophen (NORCO/VICODIN) 5-325 MG tablet Take 1 tablet by mouth every 4 (four) hours as needed. 20 tablet 0  . loratadine (CLARITIN) 10 MG tablet Take 10 mg by mouth every morning.    Marland Kitchen losartan (COZAAR) 100 MG tablet Take 50 mg by mouth every morning.    . Melatonin 5 MG CAPS Take 1 capsule by mouth at bedtime as needed. For sleep    . Menthol, Topical Analgesic, (BIOFREEZE) 4 % GEL Apply 1 application topically 2 (two) times daily as needed.     . montelukast (SINGULAIR) 10 MG tablet Take 10 mg by mouth at bedtime.    Marland Kitchen nystatin cream (MYCOSTATIN) Apply 1 application topically 2 (two) times daily as needed for dry skin.    . progesterone (PROMETRIUM) 100 MG capsule Take 100 mg by mouth at bedtime.  2  . Propylene Glycol (SYSTANE BALANCE OP) Place 2 drops into both eyes 2 (two) times daily as needed (dry eyes).        Diagnostic Studies: Dg Shoulder Right  Result Date: 06/30/2016 CLINICAL DATA:  Pain following fall EXAM: RIGHT SHOULDER - 2+ VIEW COMPARISON:  None. FINDINGS: Frontal and Y scapular views were obtained. There is avulsion along the greater tuberosity of the proximal humerus. There is also a transversely oriented fracture through the proximal humeral metaphysis with alignment essentially anatomic in this area. No other fractures are evident. No dislocation. There is moderate generalized osteoarthritic change. Visualized right lung is clear. IMPRESSION: Nondisplaced fracture proximal right humeral metaphysis. Avulsion greater tuberosity on the right. No dislocation. Moderate generalized osteoarthritic change. Electronically Signed   By: Lowella Grip III M.D.   On: 06/30/2016 13:39   Dg Elbow Complete Right  Result Date: 06/30/2016 CLINICAL DATA:  Pain following fall EXAM: RIGHT ELBOW - COMPLETE 3+ VIEW COMPARISON:  None. FINDINGS: Frontal, lateral, and bilateral oblique views were obtained. There is mild soft tissue swelling. There is no demonstrable fracture or dislocation.  The joint spaces appear normal. No erosive change. IMPRESSION: Mild soft tissue swelling. No fracture or dislocation. No apparent arthropathy. Electronically Signed   By: Lowella Grip III M.D.   On: 06/30/2016 13:40   Dg Wrist Complete Right  Result Date: 06/30/2016 CLINICAL DATA:  Pain following fall EXAM: RIGHT WRIST - COMPLETE 3+ VIEW COMPARISON:  None. FINDINGS: Frontal, oblique, lateral, and ulnar deviation scaphoid images were obtained. There is no demonstrable fracture or dislocation. The joint spaces appear normal. No erosive change. IMPRESSION: No fracture or dislocation.  No apparent arthropathy. Electronically Signed   By: Lowella Grip III M.D.   On: 06/30/2016 13:41   Dg Shoulder Right Port  Result Date: 07/20/2016 CLINICAL DATA:  Status post right shoulder arthroplasty. EXAM: PORTABLE RIGHT SHOULDER COMPARISON:  06/30/2016 FINDINGS: Interval right total shoulder arthroplasty. Hardware components are in anatomic alignment. No complications. IMPRESSION: 1. No complications following right total shoulder arthroplasty. Electronically Signed   By: Kerby Moors M.D.   On: 07/20/2016 21:01    Disposition: Skilled nursing facility       Signed: Ventura Bruns 07/23/2016, 11:47 AM

## 2016-07-23 NOTE — Progress Notes (Addendum)
   Subjective: 3 Days Post-Op Procedure(s) (LRB): OPEN REDUCTION INTERNAL FIXATION (ORIF) PROXIMAL HUMERUS FRACTURE VS REVERSE TOTAL SHOULDER ARTHROPLASTY (Right)  Pt doing fairly well Ready for d/c to SNF C/o mild itchy rash to dorsal back Patient reports pain as mild.  Objective:   VITALS:   Vitals:   07/22/16 2318 07/23/16 0420  BP:  (!) 112/57  Pulse: 88 (!) 59  Resp:  18  Temp:  97.5 F (36.4 C)    Right shoulder incision healing well nv intact distally No edema  LABS  Recent Labs  07/20/16 1449 07/21/16 0306  HGB 13.1 11.7*  HCT 41.4 37.4  WBC 6.1  --   PLT 306  --      Recent Labs  07/20/16 1449 07/21/16 0306  NA 140 142  K 4.1 3.5  BUN 13 11  CREATININE 0.55 0.58  GLUCOSE 118* 138*     Assessment/Plan: 3 Days Post-Op Procedure(s) (LRB): OPEN REDUCTION INTERNAL FIXATION (ORIF) PROXIMAL HUMERUS FRACTURE VS REVERSE TOTAL SHOULDER ARTHROPLASTY (Right) D/c to SNF  F/u in 2 weeks  Continue PT/OT    Merla Riches, MPAS, PA-C  07/23/2016, 11:45 AM   I have spoken with Care Management and with the patient and the plan now is for discharge to home with home health and 24 hour care (hired) plus family support. I did stress with the patient how important it is not to push pull or lift with the right arm.  Dr Veverly Fells

## 2016-07-23 NOTE — Discharge Summary (Signed)
Physician Discharge Summary   Patient ID: [name] [name] MRN: [id number] DOB/AGE: 65-Dec-1952 65 y.o.  Admit date: 07/20/2016 Discharge date: 07/23/2016  Admission Diagnoses:  Active Problems:   S/P shoulder replacement   Discharge Diagnoses:  Same   Surgeries: Procedure(s): OPEN REDUCTION INTERNAL FIXATION (ORIF) PROXIMAL HUMERUS FRACTURE VS REVERSE TOTAL SHOULDER ARTHROPLASTY on 07/20/2016   Consultants: OT  Discharged Condition: Stable  Hospital Course: [name] [name] is an 65 y.o. female who was admitted 07/20/2016 with a chief complaint of right shoulder pain, and found to have a diagnosis of right displaced proximal humerus fracture.  They were brought to the operating room on 07/20/2016 and underwent the above named procedures.    The patient had an uncomplicated hospital course and was stable for discharge.  Recent vital signs:  Vitals:   07/23/16 0420 07/23/16 1500  BP: (!) 112/57 126/74  Pulse: (!) 59 (!) 107  Resp: 18   Temp: 97.5 F (36.4 C) 98.1 F (36.7 C)    Recent laboratory studies:  Results for orders placed or performed during the hospital encounter of [id number]  Basic metabolic panel  Result Value Ref Range   Sodium 140 135 - 145 mmol/L   Potassium 4.1 3.5 - 5.1 mmol/L   Chloride 106 101 - 111 mmol/L   CO2 25 22 - 32 mmol/L   Glucose, Bld 118 (H) 65 - 99 mg/dL   BUN 13 6 - 20 mg/dL   Creatinine, Ser 0.55 0.44 - 1.00 mg/dL   Calcium 9.8 8.9 - 10.3 mg/dL   GFR calc non Af Amer >60 >60 mL/min   GFR calc Af Amer >60 >60 mL/min   Anion gap 9 5 - 15  CBC  Result Value Ref Range   WBC 6.1 4.0 - 10.5 K/uL   RBC 4.52 3.87 - 5.11 MIL/uL   Hemoglobin 13.1 12.0 - 15.0 g/dL   HCT 41.4 36.0 - 46.0 %   MCV 91.6 78.0 - 100.0 fL   MCH 29.0 26.0 - 34.0 pg   MCHC 31.6 30.0 - 36.0 g/dL   RDW 13.3 11.5 - 15.5 %   Platelets 306 150 - 400 K/uL  Glucose, capillary  Result Value Ref Range   Glucose-Capillary 114 (H) 65 - 99 mg/dL  Glucose, capillary    Result Value Ref Range   Glucose-Capillary 128 (H) 65 - 99 mg/dL  Glucose, capillary  Result Value Ref Range   Glucose-Capillary 134 (H) 65 - 99 mg/dL  Hemoglobin and hematocrit, blood  Result Value Ref Range   Hemoglobin 11.7 (L) 12.0 - 15.0 g/dL   HCT 37.4 36.0 - AB-123456789 %  Basic metabolic panel  Result Value Ref Range   Sodium 142 135 - 145 mmol/L   Potassium 3.5 3.5 - 5.1 mmol/L   Chloride 108 101 - 111 mmol/L   CO2 27 22 - 32 mmol/L   Glucose, Bld 138 (H) 65 - 99 mg/dL   BUN 11 6 - 20 mg/dL   Creatinine, Ser 0.58 0.44 - 1.00 mg/dL   Calcium 9.0 8.9 - 10.3 mg/dL   GFR calc non Af Amer >60 >60 mL/min   GFR calc Af Amer >60 >60 mL/min   Anion gap 7 5 - 15  Glucose, capillary  Result Value Ref Range   Glucose-Capillary 135 (H) 65 - 99 mg/dL  Glucose, capillary  Result Value Ref Range   Glucose-Capillary 163 (H) 65 - 99 mg/dL  Glucose, capillary  Result Value Ref Range   Glucose-Capillary 161 (  H) 65 - 99 mg/dL  Glucose, capillary  Result Value Ref Range   Glucose-Capillary 160 (H) 65 - 99 mg/dL  Glucose, capillary  Result Value Ref Range   Glucose-Capillary 235 (H) 65 - 99 mg/dL  Glucose, capillary  Result Value Ref Range   Glucose-Capillary 169 (H) 65 - 99 mg/dL  Glucose, capillary  Result Value Ref Range   Glucose-Capillary 157 (H) 65 - 99 mg/dL  Glucose, capillary  Result Value Ref Range   Glucose-Capillary 166 (H) 65 - 99 mg/dL    Discharge Medications:     Medication List    TAKE these medications   aspirin 81 MG tablet Take 81 mg by mouth daily.   azelastine 0.1 % nasal spray Commonly known as:  ASTELIN Place 1 spray into both nostrils 2 (two) times daily.   BIOFREEZE 4 % Gel Generic drug:  Menthol (Topical Analgesic) Apply 1 application topically 2 (two) times daily as needed.   carbidopa-levodopa 25-100 MG tablet Commonly known as:  SINEMET IR 2 tablets in the morning. 2 tablets at lunch. 1 tablet at bedtime.   cyanocobalamin 500 MCG  tablet Take 500 mcg by mouth daily.   docusate sodium 100 MG capsule Commonly known as:  COLACE Take 100 mg by mouth daily as needed for mild constipation.   escitalopram 10 MG tablet Commonly known as:  LEXAPRO Take 5 mg by mouth daily.   fenofibrate 160 MG tablet Take 160 mg by mouth every morning.   fluticasone 50 MCG/ACT nasal spray Commonly known as:  FLONASE Place 2 sprays into both nostrils daily.   HYDROcodone-acetaminophen 5-325 MG tablet Commonly known as:  NORCO/VICODIN Take 1 tablet by mouth every 4 (four) hours as needed.   loratadine 10 MG tablet Commonly known as:  CLARITIN Take 10 mg by mouth every morning.   losartan 100 MG tablet Commonly known as:  COZAAR Take 50 mg by mouth every morning.   Melatonin 5 MG Caps Take 1 capsule by mouth at bedtime as needed. For sleep   methocarbamol 500 MG tablet Commonly known as:  ROBAXIN Take 500 mg by mouth every 6 (six) hours as needed for muscle spasms.   montelukast 10 MG tablet Commonly known as:  SINGULAIR Take 10 mg by mouth at bedtime.   nystatin cream Commonly known as:  MYCOSTATIN Apply 1 application topically 2 (two) times daily as needed for dry skin.   polyethylene glycol packet Commonly known as:  MIRALAX / GLYCOLAX Take 17 g by mouth daily.   progesterone 100 MG capsule Commonly known as:  PROMETRIUM Take 100 mg by mouth at bedtime.   SYSTANE BALANCE OP Place 2 drops into both eyes 2 (two) times daily as needed (dry eyes).   VITAMIN D PO Take 5,000 Units by mouth daily.       Diagnostic Studies: Dg Shoulder Right  Result Date: 06/30/2016 CLINICAL DATA:  Pain following fall EXAM: RIGHT SHOULDER - 2+ VIEW COMPARISON:  None. FINDINGS: Frontal and Y scapular views were obtained. There is avulsion along the greater tuberosity of the proximal humerus. There is also a transversely oriented fracture through the proximal humeral metaphysis with alignment essentially anatomic in this area. No  other fractures are evident. No dislocation. There is moderate generalized osteoarthritic change. Visualized right lung is clear. IMPRESSION: Nondisplaced fracture proximal right humeral metaphysis. Avulsion greater tuberosity on the right. No dislocation. Moderate generalized osteoarthritic change. Electronically Signed   By: Lowella Grip III M.D.   On: 06/30/2016 13:39  Dg Elbow Complete Right  Result Date: 06/30/2016 CLINICAL DATA:  Pain following fall EXAM: RIGHT ELBOW - COMPLETE 3+ VIEW COMPARISON:  None. FINDINGS: Frontal, lateral, and bilateral oblique views were obtained. There is mild soft tissue swelling. There is no demonstrable fracture or dislocation. The joint spaces appear normal. No erosive change. IMPRESSION: Mild soft tissue swelling. No fracture or dislocation. No apparent arthropathy. Electronically Signed   By: Lowella Grip III M.D.   On: 06/30/2016 13:40   Dg Wrist Complete Right  Result Date: 06/30/2016 CLINICAL DATA:  Pain following fall EXAM: RIGHT WRIST - COMPLETE 3+ VIEW COMPARISON:  None. FINDINGS: Frontal, oblique, lateral, and ulnar deviation scaphoid images were obtained. There is no demonstrable fracture or dislocation. The joint spaces appear normal. No erosive change. IMPRESSION: No fracture or dislocation.  No apparent arthropathy. Electronically Signed   By: Lowella Grip III M.D.   On: 06/30/2016 13:41   Dg Shoulder Right Port  Result Date: 07/20/2016 CLINICAL DATA:  Status post right shoulder arthroplasty. EXAM: PORTABLE RIGHT SHOULDER COMPARISON:  06/30/2016 FINDINGS: Interval right total shoulder arthroplasty. Hardware components are in anatomic alignment. No complications. IMPRESSION: 1. No complications following right total shoulder arthroplasty. Electronically Signed   By: Kerby Moors M.D.   On: 07/20/2016 21:01    Disposition: 01-Home or Self Care  Discharge Instructions    Call MD / Call 911    Complete by:  As directed   If you  experience chest pain or shortness of breath, CALL 911 and be transported to the hospital emergency room.  If you develope a fever above 101 F, pus (white drainage) or increased drainage or redness at the wound, or calf pain, call your surgeon's office.   Constipation Prevention    Complete by:  As directed   Drink plenty of fluids.  Prune juice may be helpful.  You may use a stool softener, such as Colace (over the counter) 100 mg twice a day.  Use MiraLax (over the counter) for constipation as needed.   Diet - low sodium heart healthy    Complete by:  As directed   Increase activity slowly as tolerated    Complete by:  As directed      Follow-up Information    Artemisa Sladek,STEVEN R, MD. Call in 2 week(s).   Specialty:  Orthopedic Surgery Why:  call [phone] for apppointment  Contact information: [address] [place] [address] [phone]          D/C home with home health  Signed: Augustin Schooling 07/23/2016, 3:34 PM

## 2016-07-23 NOTE — Progress Notes (Signed)
SW discussed case with Surveyor, quantity. Patient does not meet criteria for SNF at this time. SW consulted Nurse CM and made her aware. SW reached out to answering service for Dr.Norris and left a message.  Tilda Burrow, MSW [phone]

## 2016-07-23 NOTE — Progress Notes (Signed)
Occupational Therapy Treatment Patient Details Name: [name] [name] MRN: [id number] DOB: [birth year] Today's Date: 07/23/2016    History of present illness 65 y.o. s/p RIGHT SHOULDER REVERSE TOTAL SHOULDER REPLACEMENT WITH TUBEROSITY REPAIR. PMH includes HTN, depression, HLD, polyneuropathy, arthritis, SOB-dyspnea, multiple system atrophy, DM, HOH. ACDF C4-18 Mar 2016.   OT comments  This 65 yo female admitted and underwent above presents to acute OT tolerating her RUE exercises. She will continue to benefit from acute OT with follow up at SNF.  Follow Up Recommendations  SNF    Equipment Recommendations  Other (comment) (TBD next venue)       Precautions / Restrictions Precautions Precautions: Shoulder;Fall Type of Shoulder Precautions: conservative--sling at all times except B/D/exercises, elbow-wrist-hand exercises, OK for gentle ADLs---NO PROM/AROM of shoulder Shoulder Interventions: Shoulder sling/immobilizer;At all times;Off for dressing/bathing/exercises Required Braces or Orthoses: Sling Restrictions Weight Bearing Restrictions: Yes RUE Weight Bearing: Non weight bearing                              Cognition   Behavior During Therapy: WFL for tasks assessed/performed Overall Cognitive Status: Within Functional Limits for tasks assessed                         Exercises Other Exercises Other Exercises: Pt seen for RUE exercises. Pt completed 10 reps of elbow flexion/extension, forearm supination/pronation, wrist flexion/extension, and composite digit flexion /extension. Then she did 10 reps more of each one. Sling repositioned for better fit (it had slid up her elbow)           Pertinent Vitals/ Pain       Pain Assessment: No/denies pain  Home Living Family/patient expects to be discharged to:: Assisted living Living Arrangements: Alone                                          Frequency Min 2X/week     Progress Toward  Goals  OT Goals(current goals can now be found in the care plan section)  Progress towards OT goals: Progressing toward goals     Plan Discharge plan remains appropriate          Activity Tolerance Patient tolerated treatment well   Patient Left in bed;with call bell/phone within reach;with bed alarm set   Nurse Communication          Time: [id number] OT Time Calculation (min): 12 min  Charges: OT General Charges $OT Visit: 1 Procedure OT Treatments $Therapeutic Exercise: 8-22 mins  Almon Register [id number] 07/23/2016, 10:52 AM

## 2016-07-23 NOTE — Progress Notes (Signed)
Discussed with patient and family discharge instructions, both verbalized agreement and understanding.  Patient's IV was discontinued with no complications.  Patient to go home in private vehicle with all belongings.

## 2016-07-23 NOTE — Care Management Note (Signed)
Case Management Note  Patient Details  Name: [name] [name] MRN: [id number] Date of Birth: 07-20-1951  Subjective/Objective:                    Action/Plan: SW called NCM stated she is unable to place patient in a SNF.   Patient aware and agreeable to home health . Called DR Veverly Fells office to notify him of same. Received call back from [facility] with OK for HHPT/OT . Patient and [facility] aware.  Expected Discharge Date:                  Expected Discharge Plan:  [place]  In-House Referral:     Discharge planning Services  CM Consult  Post Acute Care Choice:    Choice offered to:  Patient  DME Arranged:    DME Agency:     HH Arranged:    [facility] Agency:     Status of Service:  In process, will continue to follow  If discussed at Long Length of Stay Meetings, dates discussed:    Additional Comments:  Marilu Favre, RN 07/23/2016, 2:57 PM

## 2016-07-23 NOTE — Discharge Instructions (Signed)
Ice to the shoulder as much as you can.   Keep the incision covered and clean and dry for one week, then ok to shower and get it wet.  Do Not push pull or lift with the arm.  Use the sling to rest the shoulder while up, can relax the neck strap while seated and move the wrist and elbow.  Do only the gentle exercises instructed by therapy.  NWB on the operative arm  Follow up in two weeks

## 2016-07-24 ENCOUNTER — Encounter (HOSPITAL_COMMUNITY): Payer: Self-pay | Admitting: Orthopedic Surgery

## 2016-08-13 DIAGNOSIS — E1142 Type 2 diabetes mellitus with diabetic polyneuropathy: Secondary | ICD-10-CM | POA: Diagnosis not present

## 2016-08-13 DIAGNOSIS — G903 Multi-system degeneration of the autonomic nervous system: Secondary | ICD-10-CM | POA: Diagnosis not present

## 2016-08-13 DIAGNOSIS — S42251D Displaced fracture of greater tuberosity of right humerus, subsequent encounter for fracture with routine healing: Secondary | ICD-10-CM | POA: Diagnosis not present

## 2016-08-13 DIAGNOSIS — I10 Essential (primary) hypertension: Secondary | ICD-10-CM | POA: Diagnosis not present

## 2016-08-13 DIAGNOSIS — Z7982 Long term (current) use of aspirin: Secondary | ICD-10-CM | POA: Diagnosis not present

## 2016-08-13 DIAGNOSIS — M1991 Primary osteoarthritis, unspecified site: Secondary | ICD-10-CM | POA: Diagnosis not present

## 2016-08-13 DIAGNOSIS — M5416 Radiculopathy, lumbar region: Secondary | ICD-10-CM | POA: Diagnosis not present

## 2016-08-13 DIAGNOSIS — Z96611 Presence of right artificial shoulder joint: Secondary | ICD-10-CM | POA: Diagnosis not present

## 2016-08-13 DIAGNOSIS — Z9181 History of falling: Secondary | ICD-10-CM | POA: Diagnosis not present

## 2016-08-16 DIAGNOSIS — M5416 Radiculopathy, lumbar region: Secondary | ICD-10-CM | POA: Diagnosis not present

## 2016-08-16 DIAGNOSIS — G903 Multi-system degeneration of the autonomic nervous system: Secondary | ICD-10-CM | POA: Diagnosis not present

## 2016-08-16 DIAGNOSIS — I10 Essential (primary) hypertension: Secondary | ICD-10-CM | POA: Diagnosis not present

## 2016-08-16 DIAGNOSIS — M1991 Primary osteoarthritis, unspecified site: Secondary | ICD-10-CM | POA: Diagnosis not present

## 2016-08-16 DIAGNOSIS — Z9181 History of falling: Secondary | ICD-10-CM | POA: Diagnosis not present

## 2016-08-16 DIAGNOSIS — E1142 Type 2 diabetes mellitus with diabetic polyneuropathy: Secondary | ICD-10-CM | POA: Diagnosis not present

## 2016-08-16 DIAGNOSIS — Z96611 Presence of right artificial shoulder joint: Secondary | ICD-10-CM | POA: Diagnosis not present

## 2016-08-16 DIAGNOSIS — Z7982 Long term (current) use of aspirin: Secondary | ICD-10-CM | POA: Diagnosis not present

## 2016-08-16 DIAGNOSIS — S42251D Displaced fracture of greater tuberosity of right humerus, subsequent encounter for fracture with routine healing: Secondary | ICD-10-CM | POA: Diagnosis not present

## 2016-08-21 DIAGNOSIS — Z7982 Long term (current) use of aspirin: Secondary | ICD-10-CM | POA: Diagnosis not present

## 2016-08-21 DIAGNOSIS — S42251D Displaced fracture of greater tuberosity of right humerus, subsequent encounter for fracture with routine healing: Secondary | ICD-10-CM | POA: Diagnosis not present

## 2016-08-21 DIAGNOSIS — M1991 Primary osteoarthritis, unspecified site: Secondary | ICD-10-CM | POA: Diagnosis not present

## 2016-08-21 DIAGNOSIS — Z96611 Presence of right artificial shoulder joint: Secondary | ICD-10-CM | POA: Diagnosis not present

## 2016-08-21 DIAGNOSIS — E1142 Type 2 diabetes mellitus with diabetic polyneuropathy: Secondary | ICD-10-CM | POA: Diagnosis not present

## 2016-08-21 DIAGNOSIS — G903 Multi-system degeneration of the autonomic nervous system: Secondary | ICD-10-CM | POA: Diagnosis not present

## 2016-08-21 DIAGNOSIS — I10 Essential (primary) hypertension: Secondary | ICD-10-CM | POA: Diagnosis not present

## 2016-08-21 DIAGNOSIS — M5416 Radiculopathy, lumbar region: Secondary | ICD-10-CM | POA: Diagnosis not present

## 2016-08-21 DIAGNOSIS — Z9181 History of falling: Secondary | ICD-10-CM | POA: Diagnosis not present

## 2016-08-23 DIAGNOSIS — R938 Abnormal findings on diagnostic imaging of other specified body structures: Secondary | ICD-10-CM | POA: Diagnosis not present

## 2016-08-23 DIAGNOSIS — Z124 Encounter for screening for malignant neoplasm of cervix: Secondary | ICD-10-CM | POA: Diagnosis not present

## 2016-08-27 DIAGNOSIS — G903 Multi-system degeneration of the autonomic nervous system: Secondary | ICD-10-CM | POA: Diagnosis not present

## 2016-08-27 DIAGNOSIS — M1991 Primary osteoarthritis, unspecified site: Secondary | ICD-10-CM | POA: Diagnosis not present

## 2016-08-27 DIAGNOSIS — S42251D Displaced fracture of greater tuberosity of right humerus, subsequent encounter for fracture with routine healing: Secondary | ICD-10-CM | POA: Diagnosis not present

## 2016-08-27 DIAGNOSIS — I10 Essential (primary) hypertension: Secondary | ICD-10-CM | POA: Diagnosis not present

## 2016-08-27 DIAGNOSIS — Z96611 Presence of right artificial shoulder joint: Secondary | ICD-10-CM | POA: Diagnosis not present

## 2016-08-27 DIAGNOSIS — E1142 Type 2 diabetes mellitus with diabetic polyneuropathy: Secondary | ICD-10-CM | POA: Diagnosis not present

## 2016-08-27 DIAGNOSIS — M5416 Radiculopathy, lumbar region: Secondary | ICD-10-CM | POA: Diagnosis not present

## 2016-08-27 DIAGNOSIS — Z7982 Long term (current) use of aspirin: Secondary | ICD-10-CM | POA: Diagnosis not present

## 2016-08-27 DIAGNOSIS — Z9181 History of falling: Secondary | ICD-10-CM | POA: Diagnosis not present

## 2016-09-05 DIAGNOSIS — Z86010 Personal history of colonic polyps: Secondary | ICD-10-CM | POA: Diagnosis not present

## 2016-09-05 DIAGNOSIS — I10 Essential (primary) hypertension: Secondary | ICD-10-CM | POA: Diagnosis not present

## 2016-09-05 DIAGNOSIS — G20 Parkinson's disease: Secondary | ICD-10-CM | POA: Diagnosis not present

## 2016-09-11 DIAGNOSIS — H2513 Age-related nuclear cataract, bilateral: Secondary | ICD-10-CM | POA: Diagnosis not present

## 2016-09-11 DIAGNOSIS — H532 Diplopia: Secondary | ICD-10-CM | POA: Diagnosis not present

## 2016-09-11 DIAGNOSIS — E119 Type 2 diabetes mellitus without complications: Secondary | ICD-10-CM | POA: Diagnosis not present

## 2016-09-11 DIAGNOSIS — H524 Presbyopia: Secondary | ICD-10-CM | POA: Diagnosis not present

## 2016-09-13 DIAGNOSIS — Z96611 Presence of right artificial shoulder joint: Secondary | ICD-10-CM | POA: Diagnosis not present

## 2016-09-14 ENCOUNTER — Telehealth: Payer: Self-pay | Admitting: Neurology

## 2016-09-14 NOTE — Telephone Encounter (Signed)
Spoke with patient - she wanted to know if Dr. Carles Collet wanted her to go to assisted living.   Made patient aware that Dr. Doristine Devoid recommendation was to have 24 hour care and no walking. She states, "well I have a walker," made her aware she should be using a wheelchair. She is a high fall risk and should not be walking at all. She can no longer afford 24 hour care at home. Patient's son was already provided resources by our Education officer, museum for assisted living facilities.   She will call with any other questions.

## 2016-09-14 NOTE — Telephone Encounter (Signed)
[name] [name] [birth year]. Her # [phone]. She was calling to see if she was still to go to assisted living by the first of the year? She said she went to her Doctor yesterday and had her splint taken off from her shoulder surgery. Thank you

## 2016-09-20 DIAGNOSIS — Z96611 Presence of right artificial shoulder joint: Secondary | ICD-10-CM | POA: Diagnosis not present

## 2016-09-20 DIAGNOSIS — E1142 Type 2 diabetes mellitus with diabetic polyneuropathy: Secondary | ICD-10-CM | POA: Diagnosis not present

## 2016-09-20 DIAGNOSIS — M5416 Radiculopathy, lumbar region: Secondary | ICD-10-CM | POA: Diagnosis not present

## 2016-09-20 DIAGNOSIS — Z7982 Long term (current) use of aspirin: Secondary | ICD-10-CM | POA: Diagnosis not present

## 2016-09-20 DIAGNOSIS — Z9181 History of falling: Secondary | ICD-10-CM | POA: Diagnosis not present

## 2016-09-20 DIAGNOSIS — M1991 Primary osteoarthritis, unspecified site: Secondary | ICD-10-CM | POA: Diagnosis not present

## 2016-09-20 DIAGNOSIS — G903 Multi-system degeneration of the autonomic nervous system: Secondary | ICD-10-CM | POA: Diagnosis not present

## 2016-09-20 DIAGNOSIS — S42251D Displaced fracture of greater tuberosity of right humerus, subsequent encounter for fracture with routine healing: Secondary | ICD-10-CM | POA: Diagnosis not present

## 2016-09-20 DIAGNOSIS — I10 Essential (primary) hypertension: Secondary | ICD-10-CM | POA: Diagnosis not present

## 2016-09-21 DIAGNOSIS — G903 Multi-system degeneration of the autonomic nervous system: Secondary | ICD-10-CM | POA: Diagnosis not present

## 2016-09-21 DIAGNOSIS — S42251D Displaced fracture of greater tuberosity of right humerus, subsequent encounter for fracture with routine healing: Secondary | ICD-10-CM | POA: Diagnosis not present

## 2016-09-21 DIAGNOSIS — Z7982 Long term (current) use of aspirin: Secondary | ICD-10-CM | POA: Diagnosis not present

## 2016-09-21 DIAGNOSIS — M5416 Radiculopathy, lumbar region: Secondary | ICD-10-CM | POA: Diagnosis not present

## 2016-09-21 DIAGNOSIS — E1142 Type 2 diabetes mellitus with diabetic polyneuropathy: Secondary | ICD-10-CM | POA: Diagnosis not present

## 2016-09-21 DIAGNOSIS — M1991 Primary osteoarthritis, unspecified site: Secondary | ICD-10-CM | POA: Diagnosis not present

## 2016-09-21 DIAGNOSIS — Z9181 History of falling: Secondary | ICD-10-CM | POA: Diagnosis not present

## 2016-09-21 DIAGNOSIS — Z96611 Presence of right artificial shoulder joint: Secondary | ICD-10-CM | POA: Diagnosis not present

## 2016-09-21 DIAGNOSIS — I10 Essential (primary) hypertension: Secondary | ICD-10-CM | POA: Diagnosis not present

## 2016-09-24 DIAGNOSIS — G903 Multi-system degeneration of the autonomic nervous system: Secondary | ICD-10-CM | POA: Diagnosis not present

## 2016-09-24 DIAGNOSIS — I10 Essential (primary) hypertension: Secondary | ICD-10-CM | POA: Diagnosis not present

## 2016-09-24 DIAGNOSIS — M5416 Radiculopathy, lumbar region: Secondary | ICD-10-CM | POA: Diagnosis not present

## 2016-09-24 DIAGNOSIS — Z9181 History of falling: Secondary | ICD-10-CM | POA: Diagnosis not present

## 2016-09-24 DIAGNOSIS — S42251D Displaced fracture of greater tuberosity of right humerus, subsequent encounter for fracture with routine healing: Secondary | ICD-10-CM | POA: Diagnosis not present

## 2016-09-24 DIAGNOSIS — E1142 Type 2 diabetes mellitus with diabetic polyneuropathy: Secondary | ICD-10-CM | POA: Diagnosis not present

## 2016-09-24 DIAGNOSIS — Z7982 Long term (current) use of aspirin: Secondary | ICD-10-CM | POA: Diagnosis not present

## 2016-09-24 DIAGNOSIS — M1991 Primary osteoarthritis, unspecified site: Secondary | ICD-10-CM | POA: Diagnosis not present

## 2016-09-24 DIAGNOSIS — Z96611 Presence of right artificial shoulder joint: Secondary | ICD-10-CM | POA: Diagnosis not present

## 2016-09-25 DIAGNOSIS — M5416 Radiculopathy, lumbar region: Secondary | ICD-10-CM | POA: Diagnosis not present

## 2016-09-25 DIAGNOSIS — Z9181 History of falling: Secondary | ICD-10-CM | POA: Diagnosis not present

## 2016-09-25 DIAGNOSIS — M1991 Primary osteoarthritis, unspecified site: Secondary | ICD-10-CM | POA: Diagnosis not present

## 2016-09-25 DIAGNOSIS — E1142 Type 2 diabetes mellitus with diabetic polyneuropathy: Secondary | ICD-10-CM | POA: Diagnosis not present

## 2016-09-25 DIAGNOSIS — I10 Essential (primary) hypertension: Secondary | ICD-10-CM | POA: Diagnosis not present

## 2016-09-25 DIAGNOSIS — S42251D Displaced fracture of greater tuberosity of right humerus, subsequent encounter for fracture with routine healing: Secondary | ICD-10-CM | POA: Diagnosis not present

## 2016-09-25 DIAGNOSIS — Z7982 Long term (current) use of aspirin: Secondary | ICD-10-CM | POA: Diagnosis not present

## 2016-09-25 DIAGNOSIS — G903 Multi-system degeneration of the autonomic nervous system: Secondary | ICD-10-CM | POA: Diagnosis not present

## 2016-09-25 DIAGNOSIS — Z96611 Presence of right artificial shoulder joint: Secondary | ICD-10-CM | POA: Diagnosis not present

## 2016-09-30 DIAGNOSIS — S42251D Displaced fracture of greater tuberosity of right humerus, subsequent encounter for fracture with routine healing: Secondary | ICD-10-CM | POA: Diagnosis not present

## 2016-09-30 DIAGNOSIS — G903 Multi-system degeneration of the autonomic nervous system: Secondary | ICD-10-CM | POA: Diagnosis not present

## 2016-09-30 DIAGNOSIS — Z96611 Presence of right artificial shoulder joint: Secondary | ICD-10-CM | POA: Diagnosis not present

## 2016-09-30 DIAGNOSIS — E1142 Type 2 diabetes mellitus with diabetic polyneuropathy: Secondary | ICD-10-CM | POA: Diagnosis not present

## 2016-09-30 DIAGNOSIS — Z7982 Long term (current) use of aspirin: Secondary | ICD-10-CM | POA: Diagnosis not present

## 2016-09-30 DIAGNOSIS — M1991 Primary osteoarthritis, unspecified site: Secondary | ICD-10-CM | POA: Diagnosis not present

## 2016-09-30 DIAGNOSIS — I10 Essential (primary) hypertension: Secondary | ICD-10-CM | POA: Diagnosis not present

## 2016-09-30 DIAGNOSIS — M5416 Radiculopathy, lumbar region: Secondary | ICD-10-CM | POA: Diagnosis not present

## 2016-09-30 DIAGNOSIS — Z9181 History of falling: Secondary | ICD-10-CM | POA: Diagnosis not present

## 2016-10-01 DIAGNOSIS — Z7982 Long term (current) use of aspirin: Secondary | ICD-10-CM | POA: Diagnosis not present

## 2016-10-01 DIAGNOSIS — Z9181 History of falling: Secondary | ICD-10-CM | POA: Diagnosis not present

## 2016-10-01 DIAGNOSIS — I10 Essential (primary) hypertension: Secondary | ICD-10-CM | POA: Diagnosis not present

## 2016-10-01 DIAGNOSIS — M1991 Primary osteoarthritis, unspecified site: Secondary | ICD-10-CM | POA: Diagnosis not present

## 2016-10-01 DIAGNOSIS — S42251D Displaced fracture of greater tuberosity of right humerus, subsequent encounter for fracture with routine healing: Secondary | ICD-10-CM | POA: Diagnosis not present

## 2016-10-01 DIAGNOSIS — Z96611 Presence of right artificial shoulder joint: Secondary | ICD-10-CM | POA: Diagnosis not present

## 2016-10-01 DIAGNOSIS — E1142 Type 2 diabetes mellitus with diabetic polyneuropathy: Secondary | ICD-10-CM | POA: Diagnosis not present

## 2016-10-01 DIAGNOSIS — M5416 Radiculopathy, lumbar region: Secondary | ICD-10-CM | POA: Diagnosis not present

## 2016-10-01 DIAGNOSIS — G903 Multi-system degeneration of the autonomic nervous system: Secondary | ICD-10-CM | POA: Diagnosis not present

## 2016-10-09 DIAGNOSIS — E1142 Type 2 diabetes mellitus with diabetic polyneuropathy: Secondary | ICD-10-CM | POA: Diagnosis not present

## 2016-10-09 DIAGNOSIS — M1991 Primary osteoarthritis, unspecified site: Secondary | ICD-10-CM | POA: Diagnosis not present

## 2016-10-09 DIAGNOSIS — Z9181 History of falling: Secondary | ICD-10-CM | POA: Diagnosis not present

## 2016-10-09 DIAGNOSIS — Z7982 Long term (current) use of aspirin: Secondary | ICD-10-CM | POA: Diagnosis not present

## 2016-10-09 DIAGNOSIS — S42251D Displaced fracture of greater tuberosity of right humerus, subsequent encounter for fracture with routine healing: Secondary | ICD-10-CM | POA: Diagnosis not present

## 2016-10-09 DIAGNOSIS — G903 Multi-system degeneration of the autonomic nervous system: Secondary | ICD-10-CM | POA: Diagnosis not present

## 2016-10-09 DIAGNOSIS — I10 Essential (primary) hypertension: Secondary | ICD-10-CM | POA: Diagnosis not present

## 2016-10-09 DIAGNOSIS — M5416 Radiculopathy, lumbar region: Secondary | ICD-10-CM | POA: Diagnosis not present

## 2016-10-09 DIAGNOSIS — Z96611 Presence of right artificial shoulder joint: Secondary | ICD-10-CM | POA: Diagnosis not present

## 2016-10-11 DIAGNOSIS — Z96611 Presence of right artificial shoulder joint: Secondary | ICD-10-CM | POA: Diagnosis not present

## 2016-10-14 NOTE — Progress Notes (Signed)
  [name] [name] was seen today in the movement disorders clinic for neurologic consultation at the request of DEWEY,Alaynah, MD.    The patient presents for a second opinion regarding falls and balance difficulties.  She has been seeing Dr. Penumalli since January, 2015 and most recently saw him on 11/25/2015 and saw his nurse practitioner on 12/14/2015.  I have taken a detailed review of the records from [facility].  Her initial complaint in January, 2015 was of dizziness and balance change.  By November, 2015, the patient's balance change and falls had become such a problem that Dr. Penumalli told the patient that he thought it was unsafe for her to live alone.  He noted that the patient was very short stepped in her gait and she was off balance and antalgic.  Over the course of time, she continued to have repetitive falls.  As of August, 2016 the patient began to complain of drooling.  In November, 2016, the patient began to complain of tremor when holding a cup, which she states has increased.  01/26/16 update:  The patient presents today for follow-up.  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  She has continued to have falls and fell on February 23 ended up in the emergency room.  They did not do a CT of the brain.  Fortunately, she did not lose consciousness with that fall.  She did fall 3 times the day after that but none since.  She has had some dizziness with the medication.  She did have labwork at our last visit.  Her zinc was normal.  Vitamin D was normal.  Celiac panel was normal.  RPR was negative.  Vitamin B12 was slightly low at 313 and I asked her to start a B12 supplement.  Folate was normal.  The patient wanted to follow-up here a little bit earlier.  States that the diplopia is getting worse.  She had an MRI of the cervical spine on 01/16/16 that I reviewed.  There is degenerative changes and significant NFS and C5  on the L, bilaterally at C6 and 7.  03/13/16 update:  The patient presents today for follow-up.  She is accompanied by her in-home caregiver who supplements the history.  Her caregiver now comes a few hours to days a week.  I have reviewed records since last visit.  The patient saw Dr. Botero on 02/06/2016 and his notes stated that there was no doubt that the patient had cervical stenosis but he was not sure how much the surgery would help.  He told the patient to follow back up with me and my recommendations.  I subsequently got a call from the patient's primary care physician and she asked me the same question and I stated that while the surgery may help her pain, it was not going to help her falls, shuffling, or diplopia and the anesthesia could actually set the MSA back.  Last visit, I did increase her carbidopa/levodopa 25/100-2 tablets 3 times per day.  I offered to change her to the extended release to see if that would help the dizziness but she did not want to do that.  She eventually called me and stated that she was dizzy and stated that her primary care physician said that she may need discontinue the medication.  I asked her if she was sure that it was from the medication because dizziness was one of her presenting complaints   at onset in 2015.  I did tell her that she could hold the medication and see if the dizziness changed.  The patient states that she d/c the medication.  She states that she has fallen 4 times, 2 prior to the d/c of levodopa and 2 after.  She really noticed no change in the dizziness and in fact states that she is more dizzy now.  The patient did see Dr. Athar on April 20 in follow-up for her sleep apnea.  She states that she does not have to see Dr. Athar for another year and it was just recommended that she use her CPAP more and try melatonin.  06/15/16 update:  The patient presents today for follow-up.  This patient is accompanied in the office by her caregiver who supplements  the history.  She has a caregiver 2 days a week.   I have reviewed multiple records made available to me.  The patient had anterior cervical discectomy at the C4-C5, C5-C6, and C6-C7 levels on May 12.  She was discharged from the hospital on May 14.  She had no surgical complications.  She is on carbidopa/levodopa 25/100, 2 tablets in the morning, 2 in the afternoon and one in the evening.  She has had falls since surgery, the last of which was 7/26.  She was bent over to get dish washing detergent and fell on her knees.  She also fell on 7/5 and she tried to get up from the chair and her foot got caught.  She fell on 6/15 and she bruised her arm.  With the falls, the walker was present but she wasn't necessarily leaning on it at the time.  She is in PT right now 2-3 days a week.    10/16/16 update:  Pt presents for follow up.  She is accompanied by her caregiver and sister in law who supplement the history.She has MSA-C.  She is on carbidopa/levodopa 25/100, 2/2/1.  Asked me about driving last visit and I was leery because of diplopia so told her that she needed an ophthalmology evaluation.  She did go on 09/11/16 and they sent me handwritten notes but driving doesn't appear to have been addressed. She is supposed to get new glasses today.  She has not been driving much lately because of recent surgery, but admits that she did recently get released by her surgeon to drive and took a "test drive."  The records that were made available to me were reviewed.  Fell in August and sustained a humerus fx.  At that time, I recommend she stop walking all together and remain in a wheelchair at all times.  This was told to patient and her son (on the phone).  She underwent ORIF of the fx and a reverse total shoulder on 07/20/2016.  She had 24 hour/day care until this week and it was cut back on this week.  She admits that she still has been walking.  Getting choked on green tea/liquids.  "I'm having more laughter than I  should."  Neuroimaging has previously been performed.  It is available for my review today.  She had an MRI brain on 07/15/15 that was unremarkable   ALLERGIES:   Allergies  Allergen Reactions  . Aleve [Naproxen] Hives, Shortness Of Breath and Rash    Pt Avoids All Nsaids  . Zocor [Simvastatin] Other (See Comments)    "weird feeling all over body"    CURRENT MEDICATIONS:  Outpatient Encounter Prescriptions as of 10/16/2016    Medication Sig  . aspirin 81 MG tablet Take 81 mg by mouth daily.  . azelastine (ASTELIN) 0.1 % nasal spray Place 1 spray into both nostrils 2 (two) times daily.   . carbidopa-levodopa (SINEMET IR) 25-100 MG tablet 2 tablets in the morning. 2 tablets at lunch. 1 tablet at bedtime.  . Cholecalciferol (VITAMIN D PO) Take 5,000 Units by mouth daily.  . cyanocobalamin 500 MCG tablet Take 500 mcg by mouth daily.  . docusate sodium (COLACE) 100 MG capsule Take 100 mg by mouth daily as needed for mild constipation.   . escitalopram (LEXAPRO) 10 MG tablet Take 5 mg by mouth daily.  . fenofibrate 160 MG tablet Take 160 mg by mouth every morning.   . fluticasone (FLONASE) 50 MCG/ACT nasal spray Place 2 sprays into both nostrils daily.   . loratadine (CLARITIN) 10 MG tablet Take 10 mg by mouth every morning.  . losartan (COZAAR) 100 MG tablet Take 50 mg by mouth every morning.  . Melatonin 5 MG CAPS Take 1 capsule by mouth at bedtime as needed. For sleep  . Menthol, Topical Analgesic, (BIOFREEZE) 4 % GEL Apply 1 application topically 2 (two) times daily as needed.   . montelukast (SINGULAIR) 10 MG tablet Take 10 mg by mouth at bedtime.  . nystatin cream (MYCOSTATIN) Apply 1 application topically 2 (two) times daily as needed for dry skin.  . polyethylene glycol (MIRALAX / GLYCOLAX) packet Take 17 g by mouth daily.  . progesterone (PROMETRIUM) 100 MG capsule Take 100 mg by mouth at bedtime.  . Propylene Glycol (SYSTANE BALANCE OP) Place 2 drops into both eyes 2 (two) times  daily as needed (dry eyes).   . [DISCONTINUED] HYDROcodone-acetaminophen (NORCO/VICODIN) 5-325 MG tablet Take 1 tablet by mouth every 4 (four) hours as needed.  . [DISCONTINUED] methocarbamol (ROBAXIN) 500 MG tablet Take 500 mg by mouth every 6 (six) hours as needed for muscle spasms.   No facility-administered encounter medications on file as of 10/16/2016.     PAST MEDICAL HISTORY:   Past Medical History:  Diagnosis Date  . Arthritis    fingers,right shoulder  . 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 Chronic lumbar radiculopathy    L5- S1  right leg  . Depression   . Dizziness   . Frequency of urination   . Hard of hearing   . History of kidney stones 05/12/15   surgery   . HTN (hypertension)   . Hyperlipidemia   . 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HCC)   . 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HCC)   . Numbness and tingling in hands   . OSA (obstructive sleep apnea)    moderate OSA per study 11-22-2007--  refused CPAP but used oxygen for 6 months at night,  states due to wt loss stopped using oxygen  . Polyneuropathy, diabetic (HCC)    WALKS W/ CANE FOR BALANCE  . Right ureteral stone   . Shortness of breath dyspnea   . SUI (stress urinary incontinence, female)   . Type 2 diabetes mellitus (HCC)    Type II - Diet controlled  . Urinary incontinence   . Wears glasses     PAST SURGICAL HISTORY:   Past Surgical History:  Procedure Laterality Date  . ANTERIOR CERVICAL DECOMP/DISCECTOMY FUSION N/A 03/23/2016   Procedure: Cervical Four-Five, Cervical Five-Six, Cervical Six-Seven Anterior cervical decompression/diskectomy/fusion;  Surgeon: Ernesto Botero, MD;  Location: [facility];  Service: Neurosurgery;    Laterality: N/A;  C4-5 C5-6 C6-7 Anterior cervical decompression/diskectomy/fusion  . CARDIOVASCULAR STRESS TEST  09-30-2007     normal Adenosine study/  no ischemia /  normal LV function and wall motion , ef 82%  . COLONOSCOPY    . CYSTOSCOPY WITH RETROGRADE  PYELOGRAM, URETEROSCOPY AND STENT PLACEMENT Right 05/12/2015   Procedure: CYSTOSCOPY WITH RETROGRADE PYELOGRAM, URETEROSCOPY,STONE EXTRACTION AND STENT PLACEMENT;  Surgeon: [name] Dahlstedt, MD;  Location: [facility];  Service: Urology;  Laterality: Right;  . EXTRACORPOREAL SHOCK WAVE LITHOTRIPSY Right 08-18-2012  . HOLMIUM LASER APPLICATION Right 05/12/2015   Procedure: HOLMIUM LASER APPLICATION;  Surgeon: [name] Dahlstedt, MD;  Location: [facility];  Service: Urology;  Laterality: Right;  . ORIF HUMERUS FRACTURE Right 07/20/2016   Procedure: OPEN REDUCTION INTERNAL FIXATION (ORIF) PROXIMAL HUMERUS FRACTURE VS REVERSE TOTAL SHOULDER ARTHROPLASTY;  Surgeon: Steve Norris, MD;  Location: [facility];  Service: Orthopedics;  Laterality: Right;  . SHOULDER ARTHROSCOPY Right 07/2016  . TRANSTHORACIC ECHOCARDIOGRAM  09-23-2007   pseudonormal LV filling pattern,  ef 65-70%/  trivial AR/  mild LAE  . TUBAL LIGATION  1985    SOCIAL HISTORY:   Social History   Social History  . Marital status: Widowed    Spouse name: N/A  . Number of children: 1  . Years of education: College   Occupational History  . office manager   .  [employer]   Social History Main Topics  . Smoking status: Never Smoker  . Smokeless tobacco: Never Used  . Alcohol use 0.0 oz/week     Comment: 3- 4 times a year  . Drug use: No  . Sexual activity: Not on file   Other Topics Concern  . Not on file   Social History Narrative   Patient lives at home with her dog name "Hot Rod".   Caffeine Use: 2-3 cups of coffee a day        FAMILY HISTORY:   Family Status  Relation Status  . Father Deceased at age 91   heart disease  . Mother Deceased at age 44   stroke  . Sister Alive   asthma  . Sister Deceased   breast cancer  . Sister Deceased   breast cancer  .    . Sister     ROS:  A complete 10 system review of systems was obtained and was unremarkable apart from what is  mentioned above.  PHYSICAL EXAMINATION:    VITALS:   Vitals:   10/16/16 1256  BP: 124/70  Pulse: 95  Weight: 223 lb (101.2 kg)  Height: 5' 1.5" (1.562 m)     GEN:  The patient appears stated age and is in NAD. HEENT:  Normocephalic, atraumatic.  The mucous membranes are moist. The superficial temporal arteries are without ropiness or tenderness.  No fasciculations in tongue. CV:  RRR Lungs:  CTAB Neck/HEME:  There are no carotid bruits bilaterally.  Neurological examination:  Orientation: The patient is alert and oriented x3.  Cranial nerves: There is good facial symmetry with the exception of mild L ptosis.  Extraocular muscles are intact but she has some trouble with upgaze.  She has difficulty with smooth pursuit.  She has square wave jerks.   The visual fields are full to confrontational testing. The speech is fluent and clear.   Some trouble with the gutteral sounds.  Soft palate rises symmetrically and there is no tongue deviation. Hearing is intact to conversational tone. Sensation: Sensation is intact   to light touch throughout Motor: Strength is 5/5 in the bilateral upper and lower extremities.   Shoulder shrug is equal and symmetric.  There is no pronator drift.    Movement examination: Tone: There is normal tone in the bilateral upper extremities.  The tone in the lower extremities is normal.  Abnormal movements: Rare tremor of the left thumb; no dyskinesia; there is abnormal posture of the head with right head tilt and the right ear approximates the right shoulder Coordination:  There is decremation with RAM's, seen with virtually all forms of RAMs on the right Gait and Station: The patient requires mild help to get out of the chair.  She uses the walker to ambulate.  She turns en bloc even with a walker.  She was fairly steady with her walker today.  ASSESSMENT/PLAN:  1.  MSA-C  -long discussion with the patient again today.  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  Talked to her about dx, pathophysiology, prognosis, difference between this and PD.  I gave the patient as well as her sister-in-law some information to read on this diagnosis.  -The dizziness did not change when she discontinued the levodopa.  For now, I would recommend that we continue carbidopa/levodopa 25/100, 2/2/1  -Talked in detail about the fact that I really do not want her walking and want her to use her wheelchair at all times.  She is a high fall risk and has had serious consequences with falls.    -talked to her about driving last visit and told her I didn't want her driving unless cleared by ophthalmology because she has significant diplopia.  I referred her to [facility] ophthalmology and she did go on 09/11/16 and they sent me handwritten notes but driving doesn't appear to have been addressed.  Reiterated to her several times a day did not want her driving.  -talked to her about assisted living and she is looking into this.  She met with our social worker today.  -asked about future expectations and we discussed in detail. 2.  Cervical spinal stenosis and NFS.  -She is status post anterior cervical discectomy from the C4-C7 levels on 03/23/2016. 3.  Dysphagia  -do MBE 4.  Mild B12 deficiency  -is on B12 supplement 1042mg daily 5.  Constipation  -given rancho recipe at previous visits and is managing this well for now 6.  Pseudobulbar laughter  -discussed neudexta and she wishes to hold on this for now. 6.  Much greater than 50% of this visit was spent in counseling with the patient and the caregiver.  Total face to face time:  30 min

## 2016-10-16 ENCOUNTER — Encounter: Payer: Self-pay | Admitting: Neurology

## 2016-10-16 ENCOUNTER — Ambulatory Visit (INDEPENDENT_AMBULATORY_CARE_PROVIDER_SITE_OTHER): Payer: PPO | Admitting: Neurology

## 2016-10-16 VITALS — BP 124/70 | HR 95 | Ht 61.5 in | Wt 223.0 lb

## 2016-10-16 DIAGNOSIS — E1165 Type 2 diabetes mellitus with hyperglycemia: Secondary | ICD-10-CM | POA: Diagnosis not present

## 2016-10-16 DIAGNOSIS — G903 Multi-system degeneration of the autonomic nervous system: Secondary | ICD-10-CM | POA: Diagnosis not present

## 2016-10-16 DIAGNOSIS — H532 Diplopia: Secondary | ICD-10-CM | POA: Diagnosis not present

## 2016-10-16 DIAGNOSIS — R296 Repeated falls: Secondary | ICD-10-CM

## 2016-10-16 DIAGNOSIS — M4802 Spinal stenosis, cervical region: Secondary | ICD-10-CM | POA: Diagnosis not present

## 2016-10-16 DIAGNOSIS — R42 Dizziness and giddiness: Secondary | ICD-10-CM | POA: Diagnosis not present

## 2016-10-16 DIAGNOSIS — R1319 Other dysphagia: Secondary | ICD-10-CM

## 2016-10-16 DIAGNOSIS — M5416 Radiculopathy, lumbar region: Secondary | ICD-10-CM | POA: Diagnosis not present

## 2016-10-16 DIAGNOSIS — G238 Other specified degenerative diseases of basal ganglia: Secondary | ICD-10-CM

## 2016-10-16 DIAGNOSIS — Z96619 Presence of unspecified artificial shoulder joint: Secondary | ICD-10-CM | POA: Diagnosis not present

## 2016-10-16 DIAGNOSIS — G4733 Obstructive sleep apnea (adult) (pediatric): Secondary | ICD-10-CM | POA: Diagnosis not present

## 2016-10-16 NOTE — Patient Instructions (Signed)
1. We have sent a referral for you to [facility] for your modified barium swallow. They will call you to schedule this appt. Please arrive 15 minutes prior and go to 1st floor radiology. If you do not hear from them please call [phone] to schedule.

## 2016-10-16 NOTE — Progress Notes (Signed)
Clinical Social Work Note  CSW received request from Dr. Carles Collet to meet with pt during today's follow up visit to provide psychosocial support and resources surrounding assisted living and automatic pill dispensers. CSW met with pt, pt sister-in-law, and pt private caregiver in exam room.CSW introduced self and explained role. CSW provided supportive counseling as pt processed Dr. Doristine Devoid recommendations that pt needs to be in a wheelchair at all times due to being a high fall risk and recommendation for pt to transition to [place]. Pt shared that she had a fall in August which resulted in a humerus fracture and surgery. Pt states that she has private caregivers during the day and at night in her home which have been helpful, but pt is open to discussing [facility] placement based on Dr. Doristine Devoid recommendation. CSW discussed assisted living placement and provided list of local assisted living facilities. CSW encourage pt to narrow down by location which facilities she may be interested in and then visit facilities of interest if able. CSW discussed that if pt is unable to visit multiple facilities then CSW encouraged pt to ask a trusting friend or family member to visit and take pictures to bring back to pt to view. Based on the pictures and reports from those visits, then pt can narrow it down to one or two facilities that she would like to physically visit. Pt agreed that this may be the best approach to the process. CSW provided emotional support as pt and pt sister-in-law expressed being overwhelmed with the recommendation for pt to transition into assisted living before next office visit. CSW encouraged pt and pt family to take the process one step at a time as it is important for pt to go to a facility that pt is satisfied as pt quality of life is important during this transition. CSW provided education surrounding the importance of being proactive with transitioning to higher level  of care based on pt diagnosis and likelihood for continued increased care needs.  CSW discussed automatic pill dispenser and provided resource. Pt states that she has the most difficult time remembering the medication that she is due to take at lunch time. CSW discussed strategies for remembering the lunchtime dose. CSW discussed with pt that a benefit of assisted living is that the facility will administer medication.   Pt expressed that she is hopeful that an Atypical Support Group will be formed in the future. CSW discussed with pt that there is a goal to create an Atypical Support Group and CSW will update pt when that time comes.  CSW provided pt and pt sister-in-law CSW contact information. CSW encouraged pt and pt family to contact CSW with any questions surrounding assisted living placement or need for additional support during this process. Pt and pt family appreciative of visit and availability of CSW to provide support and assistance if needed before next office visit.   Alison Murray, MSW, LCSW Clinical Social Worker [facility] [phone]

## 2016-10-17 ENCOUNTER — Telehealth: Payer: Self-pay | Admitting: Neurology

## 2016-10-17 NOTE — Telephone Encounter (Signed)
Patient wanted to know if Dr. Carles Collet would "let her" stay home if she had 24 hour per day care and if she still recommends no driving and no walking alone.   Patient made aware that the living decision is ultimately up to her. Dr. Carles Collet can only give recommendations to the patient and assisted living is a good option as she has a progressive disease. She should not be walking by herself due to history of falls with serious injuries and should not be driving due to double vision unless cleared by opthalmology.   She will call with any other questions.

## 2016-10-17 NOTE — Telephone Encounter (Signed)
[name] [name] 03/31/2051. Her # [phone]. She would like you to please call her. She was seen yesterday and has some questions for Dr. Carles Collet. Thank you

## 2016-10-17 NOTE — Telephone Encounter (Signed)
I addressed this with her in detail yesterday.  She asked same question several times.  24 hour per day care is okay but I would much, much rather see her in assisted living as it has step up care that she is going to need in the future.  Also, they have activities and socialization that she needs.  It is also adapted for wheelchairs and has PT.  I think assisted is better way to go.

## 2016-10-18 ENCOUNTER — Other Ambulatory Visit (HOSPITAL_COMMUNITY): Payer: Self-pay | Admitting: Neurology

## 2016-10-18 DIAGNOSIS — R1310 Dysphagia, unspecified: Secondary | ICD-10-CM

## 2016-10-22 ENCOUNTER — Telehealth: Payer: Self-pay | Admitting: Neurology

## 2016-10-22 NOTE — Telephone Encounter (Signed)
[name] [name] [birth year]. She is requesting Dr. Carles Collet to call her at [phone]. She said she has some questions for her. Thank you

## 2016-10-22 NOTE — Telephone Encounter (Signed)
Called patient back and she states she only wants to talk to Dr. Carles Collet.   Made her aware Dr. Carles Collet is seeing patients and I would pass the questions along to her.   She states she only wants to talk to Dr. Carles Collet.   Offered to pass her messages along to Dr. Carles Collet or make an appt to discuss concerns.   She would not let me know what the questions were and she declined an appt.   Dr. Carles Collet Juluis Rainier.

## 2016-10-22 NOTE — Telephone Encounter (Signed)
Spoke with patient and made her aware she could send a message through [employer].

## 2016-10-22 NOTE — Telephone Encounter (Signed)
Please ask her to send me a mychart message.  Otherwise, she will need to pass on her question

## 2016-10-23 ENCOUNTER — Telehealth: Payer: Self-pay | Admitting: Clinical

## 2016-10-23 DIAGNOSIS — D123 Benign neoplasm of transverse colon: Secondary | ICD-10-CM | POA: Diagnosis not present

## 2016-10-23 DIAGNOSIS — K635 Polyp of colon: Secondary | ICD-10-CM | POA: Diagnosis not present

## 2016-10-23 DIAGNOSIS — Z1211 Encounter for screening for malignant neoplasm of colon: Secondary | ICD-10-CM | POA: Diagnosis not present

## 2016-10-23 DIAGNOSIS — Z86010 Personal history of colonic polyps: Secondary | ICD-10-CM | POA: Diagnosis not present

## 2016-10-23 DIAGNOSIS — K5730 Diverticulosis of large intestine without perforation or abscess without bleeding: Secondary | ICD-10-CM | POA: Diagnosis not present

## 2016-10-23 DIAGNOSIS — D120 Benign neoplasm of cecum: Secondary | ICD-10-CM | POA: Diagnosis not present

## 2016-10-23 DIAGNOSIS — D122 Benign neoplasm of ascending colon: Secondary | ICD-10-CM | POA: Diagnosis not present

## 2016-10-23 NOTE — Telephone Encounter (Signed)
CSW received phone call from pt. Pt stated that she called office yesterday and wanted to speak directly to Dr. Carles Collet regarding questions, but was unable to do so and states that she was advised to send MyChart message. Pt proceeded to ask CSW questions about disease prognosis and recommendations for 24 hour care. CSW advised pt that she needs to follow recommendations and information that she received from Dr. Carles Collet during recent visit. CSW offered to pass along questions to Dr. Carles Collet, but pt declined and CSW discussed with pt that she should send direct questions via MyChart as advised yesterday. Pt stated that she would send questions through My Chart. Pt expressed concern that she is not sure that she remembers her My Chart password. CSW provided pt with MyChart help desk phone number to assist pt in regaining access to [facility]. Pt states that she plans to notify CSW if she has any issues with getting questions into MyChart for Dr. Carles Collet to answer. Pt expressed that she was overwhelmed with the information and recommendations from recent visit and CSW validated feelings.  Alison Murray, MSW, LCSW Clinical Social Worker [facility] [phone]

## 2016-10-24 ENCOUNTER — Encounter: Payer: Self-pay | Admitting: Neurology

## 2016-10-24 NOTE — Telephone Encounter (Signed)
I have addressed this issue with her multiple times and it was addressed with her sister, son (over the phone) and caregiver.  She has called because she is hoping that I will give a different answer but this is my medical recommendation.  She called right after the visit for the same question, asking if she needs 24 hour per day care and where that needs to take place.  She needs 24 hour per day care.  If her son/sister is on release, perhaps we can contact them and figure out the heart of the issue.  Would it help if recommendations were in writing?  Would counseling be of value to help with transition (you can ask patient that).  Is she getting pressure from in home caregiver to stay in home instead of assisted living (I got that feeling).

## 2016-10-26 ENCOUNTER — Ambulatory Visit (HOSPITAL_COMMUNITY)
Admission: RE | Admit: 2016-10-26 | Discharge: 2016-10-26 | Disposition: A | Discharge status: Home / Self Care | Payer: PPO | Source: Ambulatory Visit | Attending: Neurology | Admitting: Neurology

## 2016-10-26 DIAGNOSIS — R1310 Dysphagia, unspecified: Secondary | ICD-10-CM

## 2016-10-26 DIAGNOSIS — R1319 Other dysphagia: Secondary | ICD-10-CM | POA: Diagnosis not present

## 2016-10-26 DIAGNOSIS — R1312 Dysphagia, oropharyngeal phase: Secondary | ICD-10-CM | POA: Diagnosis not present

## 2016-11-02 NOTE — Progress Notes (Signed)
   10/26/16 1300  SLP G-Codes **NOT FOR INPATIENT CLASS**  Functional Assessment Tool Used clinical judgement  Functional Limitations Swallowing  Swallow Current Status [id number]) CI  Swallow Goal Status [id number]) CI  Swallow Discharge Status [id number]) CI  SLP Evaluations  $ SLP Speech Visit 1 Procedure  SLP Evaluations  $Swallowing Treatment 1 Procedure  $MBS Swallow Outpatient 1 Procedure

## 2016-11-08 DIAGNOSIS — M542 Cervicalgia: Secondary | ICD-10-CM | POA: Diagnosis not present

## 2016-11-08 DIAGNOSIS — I10 Essential (primary) hypertension: Secondary | ICD-10-CM | POA: Diagnosis not present

## 2016-11-08 DIAGNOSIS — Z6838 Body mass index (BMI) 38.0-38.9, adult: Secondary | ICD-10-CM | POA: Diagnosis not present

## 2016-11-09 DIAGNOSIS — I10 Essential (primary) hypertension: Secondary | ICD-10-CM | POA: Diagnosis not present

## 2016-11-09 DIAGNOSIS — E1140 Type 2 diabetes mellitus with diabetic neuropathy, unspecified: Secondary | ICD-10-CM | POA: Diagnosis not present

## 2016-11-09 DIAGNOSIS — J309 Allergic rhinitis, unspecified: Secondary | ICD-10-CM | POA: Diagnosis not present

## 2016-11-09 DIAGNOSIS — Z6841 Body Mass Index (BMI) 40.0 and over, adult: Secondary | ICD-10-CM | POA: Diagnosis not present

## 2016-11-13 ENCOUNTER — Telehealth: Payer: Self-pay | Admitting: Neurology

## 2016-11-13 NOTE — Telephone Encounter (Signed)
Patient made aware she will need a wheelchair evaluation for a power wheelchair.  Contacted Debbie with [facility] and LMOM for her to call me back to start process.

## 2016-11-13 NOTE — Telephone Encounter (Signed)
Referral faxed to [facility] at [phone] with confirmation received.

## 2016-11-13 NOTE — Telephone Encounter (Signed)
PT wants to know if she can get a prescription for an electric wheelchair/[facility] CB# [phone]

## 2016-11-19 ENCOUNTER — Telehealth: Payer: Self-pay | Admitting: Neurology

## 2016-11-19 NOTE — Telephone Encounter (Signed)
Spoke with patient. She has not yet heard from [facility] about the wheelchair evaluation. Made aware I have faxed the referral to [facility]. Gave her Debbie's contact number to speak with her directly.

## 2016-11-19 NOTE — Telephone Encounter (Signed)
PT left a message regarding a wheel chair prescription/Dawn CB# [phone]

## 2016-11-27 DIAGNOSIS — G903 Multi-system degeneration of the autonomic nervous system: Secondary | ICD-10-CM | POA: Diagnosis not present

## 2016-11-27 DIAGNOSIS — M4802 Spinal stenosis, cervical region: Secondary | ICD-10-CM | POA: Diagnosis not present

## 2016-11-27 DIAGNOSIS — R42 Dizziness and giddiness: Secondary | ICD-10-CM | POA: Diagnosis not present

## 2016-11-27 DIAGNOSIS — R2689 Other abnormalities of gait and mobility: Secondary | ICD-10-CM | POA: Diagnosis not present

## 2016-11-29 ENCOUNTER — Ambulatory Visit: Payer: BLUE CROSS/BLUE SHIELD | Admitting: Neurology

## 2016-12-04 ENCOUNTER — Other Ambulatory Visit: Payer: Self-pay | Admitting: Neurology

## 2016-12-07 NOTE — Progress Notes (Signed)
  [name] [name] was seen today in the movement disorders clinic for neurologic consultation at the request of DEWEY,Alaynah, MD.    The patient presents for a second opinion regarding falls and balance difficulties.  She has been seeing Dr. Penumalli since January, 2015 and most recently saw him on 11/25/2015 and saw his nurse practitioner on 12/14/2015.  I have taken a detailed review of the records from [facility].  Her initial complaint in January, 2015 was of dizziness and balance change.  By November, 2015, the patient's balance change and falls had become such a problem that Dr. Penumalli told the patient that he thought it was unsafe for her to live alone.  He noted that the patient was very short stepped in her gait and she was off balance and antalgic.  Over the course of time, she continued to have repetitive falls.  As of August, 2016 the patient began to complain of drooling.  In November, 2016, the patient began to complain of tremor when holding a cup, which she states has increased.  01/26/16 update:  The patient presents today for follow-up.  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  She has continued to have falls and fell on February 23 ended up in the emergency room.  They did not do a CT of the brain.  Fortunately, she did not lose consciousness with that fall.  She did fall 3 times the day after that but none since.  She has had some dizziness with the medication.  She did have labwork at our last visit.  Her zinc was normal.  Vitamin D was normal.  Celiac panel was normal.  RPR was negative.  Vitamin B12 was slightly low at 313 and I asked her to start a B12 supplement.  Folate was normal.  The patient wanted to follow-up here a little bit earlier.  States that the diplopia is getting worse.  She had an MRI of the cervical spine on 01/16/16 that I reviewed.  There is degenerative changes and significant NFS and C5  on the L, bilaterally at C6 and 7.  03/13/16 update:  The patient presents today for follow-up.  She is accompanied by her in-home caregiver who supplements the history.  Her caregiver now comes a few hours to days a week.  I have reviewed records since last visit.  The patient saw Dr. Botero on 02/06/2016 and his notes stated that there was no doubt that the patient had cervical stenosis but he was not sure how much the surgery would help.  He told the patient to follow back up with me and my recommendations.  I subsequently got a call from the patient's primary care physician and she asked me the same question and I stated that while the surgery may help her pain, it was not going to help her falls, shuffling, or diplopia and the anesthesia could actually set the MSA back.  Last visit, I did increase her carbidopa/levodopa 25/100-2 tablets 3 times per day.  I offered to change her to the extended release to see if that would help the dizziness but she did not want to do that.  She eventually called me and stated that she was dizzy and stated that her primary care physician said that she may need discontinue the medication.  I asked her if she was sure that it was from the medication because dizziness was one of her presenting complaints   at onset in 2015.  I did tell her that she could hold the medication and see if the dizziness changed.  The patient states that she d/c the medication.  She states that she has fallen 4 times, 2 prior to the d/c of levodopa and 2 after.  She really noticed no change in the dizziness and in fact states that she is more dizzy now.  The patient did see Dr. [name] on April 20 in follow-up for her sleep apnea.  She states that she does not have to see Dr. [name] for another year and it was just recommended that she use her CPAP more and try melatonin.  06/15/16 update:  The patient presents today for follow-up.  This patient is accompanied in the office by her caregiver who supplements  the history.  She has a caregiver 2 days a week.   I have reviewed multiple records made available to me.  The patient had anterior cervical discectomy at the C4-C5, C5-C6, and C6-C7 levels on May 12.  She was discharged from the hospital on May 14.  She had no surgical complications.  She is on carbidopa/levodopa 25/100, 2 tablets in the morning, 2 in the afternoon and one in the evening.  She has had falls since surgery, the last of which was 7/26.  She was bent over to get dish washing detergent and fell on her knees.  She also fell on 7/5 and she tried to get up from the chair and her foot got caught.  She fell on 6/15 and she bruised her arm.  With the falls, the walker was present but she wasn't necessarily leaning on it at the time.  She is in PT right now 2-3 days a week.    10/16/16 update:  Pt presents for follow up.  She is accompanied by her caregiver and sister in law who supplement the history.She has MSA-C.  She is on carbidopa/levodopa 25/100, 2/2/1.  Asked me about driving last visit and I was leery because of diplopia so told her that she needed an ophthalmology evaluation.  She did go on 09/11/16 and they sent me handwritten notes but driving doesn't appear to have been addressed. She is supposed to get new glasses today.  She has not been driving much lately because of recent surgery, but admits that she did recently get released by her surgeon to drive and took a "test drive."  The records that were made available to me were reviewed.  Fell in August and sustained a humerus fx.  At that time, I recommend she stop walking all together and remain in a wheelchair at all times.  This was told to patient and her son (on the phone).  She underwent ORIF of the fx and a reverse total shoulder on 07/20/2016.  She had 24 hour/day care until this week and it was cut back on this week.  She admits that she still has been walking.  Getting choked on green tea/liquids.  "I'm having more laughter than I  should."  12/10/16 update:  Patient presents today for mobility evaluation.  She is accompanied by her caregiver.  A representative from [facility] is also present.  The patient remains on carbidopa/levodopa 25/100, 2/2/1.  She did have a swallow study on 10/26/2016 that demonstrated mild oropharyngeal dysphagia, but no change the dietary recommendations were made at this time.  Regular diet with thin liquids was recommended.  Last visit and several times since, I talked to her  about the fact that I do not want her living alone and that I want her using a wheelchair at all times.  She has not been doing that and actually walked into the office today.  She states that she fell on jan 20 while trying to clean up something on the floor.  Had EMS eval her that day.  No LOC and no change in MS since then, which her caregiver agrees with.  She is moving this weekend to [facility].  Her meds will be distributed.  She will get bathing assist.     ALLERGIES:   Allergies  Allergen Reactions  . Aleve [Naproxen] Hives, Shortness Of Breath and Rash    Pt Avoids All Nsaids  . Zocor [Simvastatin] Other (See Comments)    "weird feeling all over body"    CURRENT MEDICATIONS:  Outpatient Encounter Prescriptions as of 12/10/2016  Medication Sig  . aspirin 81 MG tablet Take 81 mg by mouth daily.  Marland Kitchen azelastine (ASTELIN) 0.1 % nasal spray Place 1 spray into both nostrils 2 (two) times daily.   . carbidopa-levodopa (SINEMET IR) 25-100 MG tablet TAKE 2 TABLETS BY MOUTH EVERY MORNING AND 2 TABS AT LUNCH AND 1 TABLET AT BEDTIME  . Cholecalciferol (VITAMIN D PO) Take 5,000 Units by mouth daily.  . cyanocobalamin 500 MCG tablet Take 500 mcg by mouth daily.  Marland Kitchen docusate sodium (COLACE) 100 MG capsule Take 100 mg by mouth daily as needed for mild constipation.   Marland Kitchen escitalopram (LEXAPRO) 10 MG tablet Take 5 mg by mouth daily.  . fenofibrate 160 MG tablet Take 160 mg by mouth every morning.   .  fluticasone (FLONASE) 50 MCG/ACT nasal spray Place 2 sprays into both nostrils daily.   Marland Kitchen loratadine (CLARITIN) 10 MG tablet Take 10 mg by mouth every morning.  Marland Kitchen losartan (COZAAR) 100 MG tablet Take 50 mg by mouth every morning.  . Melatonin 5 MG CAPS Take 1 capsule by mouth at bedtime as needed. For sleep  . Menthol, Topical Analgesic, (BIOFREEZE) 4 % GEL Apply 1 application topically 2 (two) times daily as needed.   . montelukast (SINGULAIR) 10 MG tablet Take 10 mg by mouth at bedtime.  Marland Kitchen nystatin cream (MYCOSTATIN) Apply 1 application topically 2 (two) times daily as needed for dry skin.  . polyethylene glycol (MIRALAX / GLYCOLAX) packet Take 17 g by mouth daily.  . progesterone (PROMETRIUM) 100 MG capsule Take 100 mg by mouth at bedtime.  Marland Kitchen Propylene Glycol (SYSTANE BALANCE OP) Place 2 drops into both eyes 2 (two) times daily as needed (dry eyes).    No facility-administered encounter medications on file as of 12/10/2016.     PAST MEDICAL HISTORY:   Past Medical History:  Diagnosis Date  . Arthritis    fingers,right shoulder  . 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 Chronic lumbar radiculopathy    L5- S1  right leg  . Depression   . Dizziness   . Frequency of urination   . Hard of hearing   . History of kidney stones 05/12/15   surgery   . HTN (hypertension)   . Hyperlipidemia   . 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 Numbness and tingling in hands   . OSA (obstructive sleep apnea)    moderate OSA per study 11-22-2007--  refused CPAP but used oxygen for 6 months at  night,  states due to wt loss stopped using oxygen  . Polyneuropathy, diabetic ([place])    WALKS W/ CANE FOR BALANCE  . Right ureteral stone   . Shortness of breath dyspnea   . SUI (stress urinary incontinence, female)   . Type 2 diabetes mellitus (HCC)    Type II - Diet controlled  . Urinary incontinence   . Wears glasses     PAST SURGICAL  HISTORY:   Past Surgical History:  Procedure Laterality Date  . ANTERIOR CERVICAL DECOMP/DISCECTOMY FUSION N/A 03/23/2016   Procedure: Cervical Four-Five, Cervical Five-Six, Cervical Six-Seven Anterior cervical decompression/diskectomy/fusion;  Surgeon: Leeroy Cha, MD;  Location: [facility];  Service: Neurosurgery;  Laterality: N/A;  C4-5 C5-6 C6-7 Anterior cervical decompression/diskectomy/fusion  . CARDIOVASCULAR STRESS TEST  09-30-2007     normal Adenosine study/  no ischemia /  normal LV function and wall motion , ef 82%  . COLONOSCOPY    . CYSTOSCOPY WITH RETROGRADE PYELOGRAM, URETEROSCOPY AND STENT PLACEMENT Right 05/12/2015   Procedure: CYSTOSCOPY WITH RETROGRADE PYELOGRAM, URETEROSCOPY,STONE EXTRACTION AND STENT PLACEMENT;  Surgeon: Franchot Gallo, MD;  Location: [facility];  Service: Urology;  Laterality: Right;  . EXTRACORPOREAL SHOCK WAVE LITHOTRIPSY Right 08-18-2012  . HOLMIUM LASER APPLICATION Right 0000000   Procedure: HOLMIUM LASER APPLICATION;  Surgeon: Franchot Gallo, MD;  Location[facility];  Service: Urology;  Laterality: Right;  . ORIF HUMERUS FRACTURE Right 07/20/2016   Procedure: OPEN REDUCTION INTERNAL FIXATION (ORIF) PROXIMAL HUMERUS FRACTURE VS REVERSE TOTAL SHOULDER ARTHROPLASTY;  Surgeon: Netta Cedars, MD;  Location: [facility];  Service: Orthopedics;  Laterality: Right;  . SHOULDER ARTHROSCOPY Right 07/2016  . TRANSTHORACIC ECHOCARDIOGRAM  09-23-2007   pseudonormal LV filling pattern,  ef 65-70%/  trivial AR/  mild LAE  . TUBAL LIGATION  1985    SOCIAL HISTORY:   Social History   Social History  . Marital status: Widowed    Spouse name: N/A  . Number of children: 1  . Years of education: College   Occupational History  . office manager   .  [employer]   Social History Main Topics  . Smoking status: Never Smoker  . Smokeless tobacco: Never Used  . Alcohol use 0.0 oz/week     Comment: 3- 4 times a year  .  Drug use: No  . Sexual activity: Not on file   Other Topics Concern  . Not on file   Social History Narrative   Patient lives at home with her dog name "Hot Rod".   Caffeine Use: 2-3 cups of coffee a day        FAMILY HISTORY:   Family Status  Relation Status  . Father Deceased at age 58   heart disease  . Mother Deceased at age 81   stroke  . Sister Alive   asthma  . Sister Deceased   breast cancer  . Sister Deceased   breast cancer  .    Marland Kitchen Sister     ROS:  A complete 10 system review of systems was obtained and was unremarkable apart from what is mentioned above.  PHYSICAL EXAMINATION:    VITALS:   Vitals:   12/10/16 0844  BP: 118/80  Pulse: 80  Weight: 226 lb (102.5 kg)  Height: 5' 1.5" (1.562 m)     GEN:  The patient appears stated age and is in NAD. HEENT:  Normocephalic.  Ecchymosis on the forehead bilaterally, right more than L.    The  mucous membranes are very dry. The superficial temporal arteries are without ropiness or tenderness.  No fasciculations in tongue. CV:  RRR Lungs:  CTAB Neck/HEME:  There are no carotid bruits bilaterally.  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  Neurological examination:  Orientation: The patient is alert and oriented x3.  Cranial nerves: There is good facial symmetry with the exception of mild L ptosis.  Extraocular muscles are intact but she has some trouble with upgaze.  She has difficulty with smooth pursuit.  She has square wave jerks.   The visual fields are full to confrontational testing. The speech is fluent and clear.   Some trouble with the gutteral sounds.  Soft palate rises symmetrically and there is no tongue deviation. Hearing is intact to conversational tone. Sensation: Sensation is intact to light touch throughout Motor: Strength is 5/5 in the bilateral upper and lower extremities.   Shoulder shrug is equal and symmetric.  There is no pronator drift.    Movement examination: Tone: There is normal  tone in the bilateral upper extremities.  The tone in the lower extremities is normal.  Abnormal movements: Rare tremor of the left thumb; no dyskinesia; there is abnormal posture of the head with right head tilt and the right ear approximates the right shoulder Coordination:  There is decremation with RAM's, seen with virtually all forms of RAMs on the right, especially toe taps.   Gait and Station: The patient requires mild help to get out of the chair.  She uses the examiner as assistance to ambulate.  She hesitates in the doorway.  She is short stepped and mildly wide based.  Turns en bloc.  Is unsteady.    ASSESSMENT/PLAN:  1.  MSA-C  -long discussion with the patient again today.  In a patient who presents with falls, and later develops shuffling, diplopia, cervical dystonia and incoordination on the right, the diagnosis is multiple system atrophy, cerebellar type.    -For now, I would recommend that we continue carbidopa/levodopa 25/100, 2/2/1  -Talked in detail about the fact that I really do not want her walking and want her to use her wheelchair at all times.  She is a high fall risk and has had serious consequences with falls.    -She has had her PT eval for the power WC and I have reviewed and agreed with that eval.  She cannot use a manual WC because of coordination.  She needs a tilt to help with pressure relief as she cannot adjust on her own.  While she can stand on her own, she is a huge fall risk every time that she stands, therefore making it unsafe for her to use a cane or walker.  She cannot use a scooter due to inability to mount the scooter with this diagnosis.  She needs a power WC for virtually all ADL's (to get to her dining room and to get to the bathroom).  She has no barriers from a mental standpoint to use a power WC.    -moving to assisted living this week  -debated CT brain given fall 10 days ago with echhymosis but no MS change and doing well since that time.  -asked  about future expectations and we discussed in detail.  Talked about end of life care.  States already has DNR order on refrig at home.  Expresses desire to put in same orders here. 2.  Cervical spinal stenosis and NFS.  -She is status post anterior cervical  discectomy from the C4-C7 levels on 03/23/2016.  -seeing Dr. Maryjean Ka now as still having pain and considering injections. Pain likely from dystonia and not sure injections will help 3.  Dysphagia  -She did have a swallow study on 10/26/2016 that demonstrated mild oropharyngeal dysphagia, but no change the dietary recommendations were made.  Regular diet with thin liquids was recommended. 4.  Mild B12 deficiency  -is on B12 supplement 1020mcg daily 5.  Constipation  -given rancho recipe at previous visits and is managing this well for now 6.  Pseudobulbar laughter  -discussed neudexta and she wishes to hold on this for now. 6.  Much greater than 50% of this visit was spent in counseling with the patient and the caregiver.  Total face to face time:  40 min

## 2016-12-10 ENCOUNTER — Ambulatory Visit (INDEPENDENT_AMBULATORY_CARE_PROVIDER_SITE_OTHER): Payer: PPO | Admitting: Neurology

## 2016-12-10 ENCOUNTER — Encounter: Payer: Self-pay | Admitting: Neurology

## 2016-12-10 VITALS — BP 118/80 | HR 80 | Ht 61.5 in | Wt 226.0 lb

## 2016-12-10 DIAGNOSIS — Z515 Encounter for palliative care: Secondary | ICD-10-CM | POA: Diagnosis not present

## 2016-12-10 DIAGNOSIS — M47812 Spondylosis without myelopathy or radiculopathy, cervical region: Secondary | ICD-10-CM | POA: Diagnosis not present

## 2016-12-10 DIAGNOSIS — G903 Multi-system degeneration of the autonomic nervous system: Secondary | ICD-10-CM

## 2016-12-10 DIAGNOSIS — M542 Cervicalgia: Secondary | ICD-10-CM | POA: Diagnosis not present

## 2016-12-10 DIAGNOSIS — G238 Other specified degenerative diseases of basal ganglia: Secondary | ICD-10-CM

## 2016-12-11 DIAGNOSIS — Z1231 Encounter for screening mammogram for malignant neoplasm of breast: Secondary | ICD-10-CM | POA: Diagnosis not present

## 2016-12-14 ENCOUNTER — Telehealth: Payer: Self-pay | Admitting: Neurology

## 2016-12-14 NOTE — Telephone Encounter (Signed)
Patient needs FL2 filled out before she can move to assisted living. She has an appt scheduled with PCP next Tuesday but wanted to know if Dr. Carles Collet should fill this out. I made her aware to have this completed by PCP. She will call with any other questions.

## 2016-12-14 NOTE — Telephone Encounter (Signed)
Patient needs to talk to someone about a form she left message on the voice mail [phone]

## 2016-12-18 DIAGNOSIS — G1220 Motor neuron disease, unspecified: Secondary | ICD-10-CM | POA: Diagnosis not present

## 2016-12-18 DIAGNOSIS — I10 Essential (primary) hypertension: Secondary | ICD-10-CM | POA: Diagnosis not present

## 2016-12-18 DIAGNOSIS — E1140 Type 2 diabetes mellitus with diabetic neuropathy, unspecified: Secondary | ICD-10-CM | POA: Diagnosis not present

## 2016-12-18 DIAGNOSIS — Z6841 Body Mass Index (BMI) 40.0 and over, adult: Secondary | ICD-10-CM | POA: Diagnosis not present

## 2016-12-24 DIAGNOSIS — Z6838 Body mass index (BMI) 38.0-38.9, adult: Secondary | ICD-10-CM | POA: Diagnosis not present

## 2016-12-24 DIAGNOSIS — M542 Cervicalgia: Secondary | ICD-10-CM | POA: Diagnosis not present

## 2016-12-24 DIAGNOSIS — M47812 Spondylosis without myelopathy or radiculopathy, cervical region: Secondary | ICD-10-CM | POA: Diagnosis not present

## 2016-12-24 DIAGNOSIS — I10 Essential (primary) hypertension: Secondary | ICD-10-CM | POA: Diagnosis not present

## 2016-12-25 DIAGNOSIS — Z96611 Presence of right artificial shoulder joint: Secondary | ICD-10-CM | POA: Diagnosis not present

## 2016-12-25 DIAGNOSIS — Z471 Aftercare following joint replacement surgery: Secondary | ICD-10-CM | POA: Diagnosis not present

## 2016-12-25 DIAGNOSIS — S42291D Other displaced fracture of upper end of right humerus, subsequent encounter for fracture with routine healing: Secondary | ICD-10-CM | POA: Diagnosis not present

## 2016-12-26 ENCOUNTER — Ambulatory Visit (INDEPENDENT_AMBULATORY_CARE_PROVIDER_SITE_OTHER): Payer: PPO | Admitting: Neurology

## 2016-12-26 ENCOUNTER — Encounter: Payer: Self-pay | Admitting: Neurology

## 2016-12-26 VITALS — BP 128/71 | HR 89

## 2016-12-26 DIAGNOSIS — Z9989 Dependence on other enabling machines and devices: Secondary | ICD-10-CM | POA: Diagnosis not present

## 2016-12-26 DIAGNOSIS — G4733 Obstructive sleep apnea (adult) (pediatric): Secondary | ICD-10-CM

## 2016-12-26 NOTE — Patient Instructions (Signed)

## 2016-12-26 NOTE — Progress Notes (Signed)
Subjective:    Patient ID: [name] [name] is a 66 y.o. female.  HPI     Interim history:    [name] [name] is a 66 year old right-handed woman with an underlying medical history of hypertension, diabetes, hyperlipidemia, LBP, depression, cervical spinal stenosis, obesity, neuropathy, recurrent falls and gait disorder, dizziness, and parkinsonism, who presents for follow-up consultation of her obstructive sleep apnea, on treatment with CPAP. The patient is accompanied by an attendant from [facility] today. I last saw her on 03/01/2016, at which time she reported several falls. She was supposed to have neck surgery under Dr. [name]. She was not fully compliant with CPAP therapy and encouraged to try to use CPAP all night every night. She was planning to move into assisted living.   Today, 12/26/2016 (all dictated new, as well as above notes, some dictation done in note pad or Word, outside of chart, may appear as copied): She reportsDoing fairly well, overall, she has had a good transition to her new assisted living facility, she has been there for about a week. She's not exercising very much, she is practically wheelchair bound. She had a fall about 2 weeks ago. She has not been driving. 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 She has a son in [place]. She does not always keep the CPAP on. She starts off using it but in the middle of the night or early morning hours she has to get up to use the bathroom, which is usually 1 to 3 times per average night. Then she goes and sits in her lift chair and sleeps there without the CPAP on. In the interim, she had right shoulder replacement on 07/20/2016 under Dr. Veverly Fells, after she had sustained a displaced right humerus fracture. She had neck surgery on 03/23/2016 under Dr. [name]. She had anterior cervical C4-5, C5-6 and C6-7 discectomy, decompression of spinal cord, foraminotomy, interbody fusion with graft and plate. She was recently  seen by Dr. Carles Collet on 12/10/2016 for her parkinsonism, concern for MSA and I reviewed the note. I reviewed her CPAP compliance data from 11/25/2016 through 12/24/2016, which is a total of 30 days, during which time she used her CPAP 29 days with percent used days greater than 4 hours at 30% only, indicating suboptimal compliance with an average usage of only 3 hours and 28 minutes, residual AHI 4.2 per hour, leak on the high side with the 95th percentile at 30.7 L/m on a pressure of 7 cm with EPR of 3. Saw Dr. Veverly Fells yesterday, released from the right shoulder aspect. Saw Dr. Maryjean Ka for neck pain recently and had neck injection in Jan., but did not help very much.    Previously (copied from previous notes for reference):   I first met her on 09/20/2015 at the request of her primary care physician, at which time she reported a prior diagnosis of OSA several years ago. She was invited back for sleep study. She had a split-night sleep study on 10/12/2015. Her baseline sleep efficiency was 73.1%, latency to sleep was 26 minutes, wake after sleep onset was 19.5 minutes with moderate sleep fragmentation noted. She had an increased percentage of light stage sleep, absence of REM sleep. She had moderate snoring. Total AHI was 33.5 per hour, average oxygen saturation was 93%, nadir was 77%. She was titrated on CPAP during the second part of the study with low sleep efficiency at 21.7% noted. She had several longer periods of wakefulness. CPAP was titrated from  5-6 cm. AHI was 0 per hour at the final pressure. Supine REM sleep was achieved with an oxygen nadir of 88%. I suggested we treat her with CPAP therapy at home at a pressure of 7 cm. She was seen in the interim by Cecille Rubin in February 2017 for sleep apnea follow-up. She has been following for her gait disorder with Dr. Carles Collet.    I reviewed her CPAP compliance data from 01/30/2016 through 02/28/2016 which is a total of 30 days during which time she used her  machine every night with percent used days greater than 4 hours at 53%, indicating suboptimal compliance with an average usage for all days of 4 hours and 10 minutes, residual AHI acceptable at 4.2 per hour, leak at times high with the 95th percentile at 32.3 L/m, pressure of 7 cm with EPR of 3.   09/20/2015: She has dizziness, for which she has been seeing Dr. [name] in our office since 11/24/2013. She reports a prior diagnosis of OSA. She had a sleep study in 2008 or 9. She was offered CPAP therapy but did not pursue this for fear of being unable to tolerate it. She was placed on oxygen therapy for about 6 months and felt better but then stopped it. She has lost weight and this last year in the realm of 25 pounds. She has been enrolled in [employer]. She does not smoke, she drinks alcohol occasionally but does drink a lot of caffeine in the form of coffee, about 3 cups per day and to bottles of soda. She goes to bed between 8 or 9 and has the TV on which stays on all night. She lives alone and has a small dog. She has 1 grown son who is local. She is retired. She is widowed. She has no family history of obstructive sleep apnea. Rise time is around 7 AM. Her Epworth sleepiness score is 11 out of 24 today, her fatigue score is 49 out of 63. She does not recall any family history of OSA. She would be willing to be tested for sleep apnea again and consider CPAP therapy if the need arises. She reports no family history of Parkinson's disease. She has difficulty with her right side as and feeling weaker on the right side. She is afraid of falls. She has fallen repeatedly. She is in outpatient physical therapy at the neuro rehabilitation unit. She has nocturia usually twice per night. She has morning headaches about 3-4 times a month. She denies restless leg symptoms or leg twitching in her sleep. Her husband had reported her snoring.   She recently saw Dr. [name] on 09/14/2015 for follow-up. She reported 2  falls recently. I reviewed the office note. He voiced concern that she may have hemi-parkinsonism.   Her Past Medical History Is Significant For: Past Medical History:  Diagnosis Date  . Arthritis    fingers,right shoulder  . 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 Chronic lumbar radiculopathy    L5- S1  right leg  . Depression   . Dizziness   . Frequency of urination   . Hard of hearing   . History of kidney stones 05/12/15   surgery   . HTN (hypertension)   . Hyperlipidemia   . 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 Numbness and tingling in hands   . OSA (obstructive sleep apnea)  moderate OSA per study 11-22-2007--  refused CPAP but used oxygen for 6 months at night,  states due to wt loss stopped using oxygen  . Polyneuropathy, diabetic ([place])    WALKS W/ CANE FOR BALANCE  . Right ureteral stone   . Shortness of breath dyspnea   . SUI (stress urinary incontinence, female)   . Type 2 diabetes mellitus ([facility])    Type II - Diet controlled  . Urinary incontinence   . Wears glasses     Her Past Surgical History Is Significant For: Past Surgical History:  Procedure Laterality Date  . ANTERIOR CERVICAL DECOMP/DISCECTOMY FUSION N/A 03/23/2016   Procedure: Cervical Four-Five, Cervical Five-Six, Cervical Six-Seven Anterior cervical decompression/diskectomy/fusion;  Surgeon: Leeroy Cha, MD;  Location: [facility];  Service: Neurosurgery;  Laterality: N/A;  C4-5 C5-6 C6-7 Anterior cervical decompression/diskectomy/fusion  . CARDIOVASCULAR STRESS TEST  09-30-2007     normal Adenosine study/  no ischemia /  normal LV function and wall motion , ef 82%  . COLONOSCOPY    . CYSTOSCOPY WITH RETROGRADE PYELOGRAM, URETEROSCOPY AND STENT PLACEMENT Right 05/12/2015   Procedure: CYSTOSCOPY WITH RETROGRADE PYELOGRAM, URETEROSCOPY,STONE EXTRACTION AND STENT PLACEMENT;  Surgeon: Franchot Gallo, MD;  Location: [facility];  Service: Urology;  Laterality: Right;  . EXTRACORPOREAL SHOCK WAVE LITHOTRIPSY Right 08-18-2012  . HOLMIUM LASER APPLICATION Right 9/56/2130   Procedure: HOLMIUM LASER APPLICATION;  Surgeon: Franchot Gallo, MD;  Location: [facility];  Service: Urology;  Laterality: Right;  . ORIF HUMERUS FRACTURE Right 07/20/2016   Procedure: OPEN REDUCTION INTERNAL FIXATION (ORIF) PROXIMAL HUMERUS FRACTURE VS REVERSE TOTAL SHOULDER ARTHROPLASTY;  Surgeon: Netta Cedars, MD;  Location: [facility];  Service: Orthopedics;  Laterality: Right;  . SHOULDER ARTHROSCOPY Right 07/2016  . TRANSTHORACIC ECHOCARDIOGRAM  09-23-2007   pseudonormal LV filling pattern,  ef 65-70%/  trivial AR/  mild LAE  . TUBAL LIGATION  1985    Her Family History Is Significant For: Family History  Problem Relation Age of Onset  . Heart Problems Father   . Stroke Mother   . Hypertension    . Stroke    . Breast cancer Sister     Her Social History Is Significant For: Social History   Social History  . Marital status: Widowed    Spouse name: N/A  . Number of children: 1  . Years of education: College   Occupational History  . office manager   .  [employer]   Social History Main Topics  . Smoking status: Never Smoker  . Smokeless tobacco: Never Used  . Alcohol use 0.0 oz/week     Comment: 3- 4 times a year  . Drug use: No  . Sexual activity: Not Asked   Other Topics Concern  . None   Social History Narrative   Patient lives at home with her dog name "Hot Rod".   Caffeine Use: 2-3 cups of coffee a day        Her Allergies Are:  Allergies  Allergen Reactions  . Aleve [Naproxen] Hives, Shortness Of Breath and Rash    Pt Avoids All Nsaids  . Zocor [Simvastatin] Other (See Comments)    "weird feeling all over body"  :   Her Current Medications Are:  Outpatient Encounter Prescriptions as of 12/26/2016  Medication Sig  . aspirin 81 MG tablet Take 81 mg by mouth daily.  Marland Kitchen  azelastine (ASTELIN) 0.1 % nasal spray Place 1 spray into both nostrils 2 (two) times daily.   Marland Kitchen  carbidopa-levodopa (SINEMET IR) 25-100 MG tablet TAKE 2 TABLETS BY MOUTH EVERY MORNING AND 2 TABS AT LUNCH AND 1 TABLET AT BEDTIME  . Cholecalciferol (VITAMIN D PO) Take 5,000 Units by mouth daily.  . cyanocobalamin 500 MCG tablet Take 500 mcg by mouth daily.  Marland Kitchen docusate sodium (COLACE) 100 MG capsule Take 100 mg by mouth daily as needed for mild constipation.   Marland Kitchen escitalopram (LEXAPRO) 10 MG tablet Take 5 mg by mouth daily.  . fenofibrate 160 MG tablet Take 160 mg by mouth every morning.   . fluticasone (FLONASE) 50 MCG/ACT nasal spray Place 2 sprays into both nostrils daily.   Marland Kitchen glucosamine-chondroitin 500-400 MG tablet Take 1 tablet by mouth 2 (two) times daily.  Marland Kitchen loratadine (CLARITIN) 10 MG tablet Take 10 mg by mouth every morning.  Marland Kitchen losartan (COZAAR) 100 MG tablet Take 50 mg by mouth every morning.  . Melatonin 5 MG CAPS Take 1 capsule by mouth at bedtime as needed. For sleep  . montelukast (SINGULAIR) 10 MG tablet Take 10 mg by mouth at bedtime.  Marland Kitchen nystatin cream (MYCOSTATIN) Apply 1 application topically 2 (two) times daily as needed for dry skin.  . polyethylene glycol (MIRALAX / GLYCOLAX) packet Take 17 g by mouth daily.  . progesterone (PROMETRIUM) 100 MG capsule Take 100 mg by mouth at bedtime.  Marland Kitchen Propylene Glycol (SYSTANE BALANCE OP) Place 2 drops into both eyes 2 (two) times daily as needed (dry eyes).   . [DISCONTINUED] Menthol, Topical Analgesic, (BIOFREEZE) 4 % GEL Apply 1 application topically 2 (two) times daily as needed.   . [DISCONTINUED] Misc Natural Products (GLUCOSAMINE CHONDROITIN MSM PO) Take by mouth.   No facility-administered encounter medications on file as of 12/26/2016.   :  Review of Systems:  Out of a complete 14 point review of systems, all are reviewed and negative with the exception of these symptoms as listed below:  Review of Systems  Neurological:        Pt presents today to discuss her cpap usage.     Objective:  Neurologic Exam  Physical Exam Physical Examination:   Vitals:   12/26/16 1302  BP: 128/71  Pulse: 89   General Examination: The patient is a very pleasant 66 y.o. female in no acute distress. She appears well-developed and well-nourished and well groomed.   HEENT: Normocephalic, atraumatic, pupils are equal, round and reactive to light and accommodation. Extraocular tracking is difficult for her, Mild end gaze nystagmus in all directions. Hearing is intact. Face is symmetric, no significant facial masking is noted, no significant dysarthria, mild hypophonia as noted, neck has decreased range of motion. Oropharynx exam shows mild mouth dryness, adequate dental hygiene, moderate airway crowding. Tongue and palate central and symmetrical.    Chest: Clear to auscultation without wheezing, rhonchi or crackles noted.  Heart: S1+S2+0, regular and normal without murmurs, rubs or gallops noted.   Abdomen: Soft, non-tender and non-distended with normal bowel sounds appreciated on auscultation.  Extremities: There is non-pitting puffiness in both distal lower extremities.   Skin: Warm and dry without trophic changes noted.  Musculoskeletal: exam reveals no obvious joint deformities, tenderness or joint swelling or erythema. She has decreased range of motion in the right shoulder.  Neurologically:  Mental status: The patient is awake, alert and oriented in all 4 spheres. Her immediate and remote memory, attention, language skills and fund of knowledge are appropriate. There is no evidence of aphasia, agnosia, apraxia or anomia. Speech is clear with normal  prosody and enunciation. Thought process is linear. Mood is normal and affect is normal.  Cranial nerves II - XII are as described above under HEENT exam. In addition: shoulder shrug is normal with equal shoulder height noted. Motor exam: Normal bulk, global strength of 4 out of 5,  no increased tone, no drift, or tremor. Romberg is not testable. Reflexes are 1+. Fine motor skills and coordination:  globally mildly impaired throughout. I did not have her stand or walk for me today. She is in the wheelchair and did not bring a walker. Sensory exam is intact to light touch.   Assessment and Plan:    In summary, MARYAN SIVAK is a very pleasant 66 y.o.-year old female with an Underlying medical history of hypertension, diabetes, hyperlipidemia, low back pain, depression, spinal stenosis, neuropathy, gait disorder concern for MSA, status post recent neck surgery, status post recent right shoulder surgery, recurrent falls, and obesity, who presents for follow-up consultation of her obstructive sleep apnea. She had a split-night sleep study in November 2016. She is suboptimal with her compliance and while she typically starts using her CPAP in the beginning of the night she often ends up sleeping in her lift chair and does not use her CPAP for the rest of the night. She is encouraged to be fully compliant with CPAP therapy. She had a fairly smooth transition to assisted living. She has given up driving. She is practically wheelchair-bound and often does not get much in the way of physical activity. She is encouraged to pursue physical activity at her assisted living facility, including sitdown exercises. I suggested a one-year checkup routinely for sleep apnea. She gets her supplies from [employer].  I answered all her questions today and filled out her paperwork for her assisted living facility as well. I spent 20 minutes in total face-to-face time with the patient, more than 50% of which was spent in counseling and coordination of care, reviewing test results, reviewing medication and discussing or reviewing the diagnosis of OSA, its prognosis and treatment options. Pertinent laboratory and imaging test results that were available during this visit with the patient were reviewed by me and  considered in my medical decision making (see chart for details).

## 2016-12-31 DIAGNOSIS — M4802 Spinal stenosis, cervical region: Secondary | ICD-10-CM | POA: Diagnosis not present

## 2016-12-31 DIAGNOSIS — F329 Major depressive disorder, single episode, unspecified: Secondary | ICD-10-CM | POA: Diagnosis not present

## 2016-12-31 DIAGNOSIS — I10 Essential (primary) hypertension: Secondary | ICD-10-CM | POA: Diagnosis not present

## 2016-12-31 DIAGNOSIS — E119 Type 2 diabetes mellitus without complications: Secondary | ICD-10-CM | POA: Diagnosis not present

## 2016-12-31 DIAGNOSIS — Z7982 Long term (current) use of aspirin: Secondary | ICD-10-CM | POA: Diagnosis not present

## 2016-12-31 DIAGNOSIS — R32 Unspecified urinary incontinence: Secondary | ICD-10-CM | POA: Diagnosis not present

## 2016-12-31 DIAGNOSIS — M5416 Radiculopathy, lumbar region: Secondary | ICD-10-CM | POA: Diagnosis not present

## 2016-12-31 DIAGNOSIS — Z6838 Body mass index (BMI) 38.0-38.9, adult: Secondary | ICD-10-CM | POA: Diagnosis not present

## 2016-12-31 DIAGNOSIS — Z96619 Presence of unspecified artificial shoulder joint: Secondary | ICD-10-CM | POA: Diagnosis not present

## 2016-12-31 DIAGNOSIS — R42 Dizziness and giddiness: Secondary | ICD-10-CM | POA: Diagnosis not present

## 2016-12-31 DIAGNOSIS — M6250 Muscle wasting and atrophy, not elsewhere classified, unspecified site: Secondary | ICD-10-CM | POA: Diagnosis not present

## 2016-12-31 DIAGNOSIS — M47812 Spondylosis without myelopathy or radiculopathy, cervical region: Secondary | ICD-10-CM | POA: Diagnosis not present

## 2017-01-09 DIAGNOSIS — Z96619 Presence of unspecified artificial shoulder joint: Secondary | ICD-10-CM | POA: Diagnosis not present

## 2017-01-09 DIAGNOSIS — M6250 Muscle wasting and atrophy, not elsewhere classified, unspecified site: Secondary | ICD-10-CM | POA: Diagnosis not present

## 2017-01-09 DIAGNOSIS — Z6838 Body mass index (BMI) 38.0-38.9, adult: Secondary | ICD-10-CM | POA: Diagnosis not present

## 2017-01-09 DIAGNOSIS — F329 Major depressive disorder, single episode, unspecified: Secondary | ICD-10-CM | POA: Diagnosis not present

## 2017-01-09 DIAGNOSIS — I10 Essential (primary) hypertension: Secondary | ICD-10-CM | POA: Diagnosis not present

## 2017-01-09 DIAGNOSIS — R42 Dizziness and giddiness: Secondary | ICD-10-CM | POA: Diagnosis not present

## 2017-01-09 DIAGNOSIS — Z7982 Long term (current) use of aspirin: Secondary | ICD-10-CM | POA: Diagnosis not present

## 2017-01-09 DIAGNOSIS — M5416 Radiculopathy, lumbar region: Secondary | ICD-10-CM | POA: Diagnosis not present

## 2017-01-09 DIAGNOSIS — R32 Unspecified urinary incontinence: Secondary | ICD-10-CM | POA: Diagnosis not present

## 2017-01-09 DIAGNOSIS — E119 Type 2 diabetes mellitus without complications: Secondary | ICD-10-CM | POA: Diagnosis not present

## 2017-01-09 DIAGNOSIS — M47812 Spondylosis without myelopathy or radiculopathy, cervical region: Secondary | ICD-10-CM | POA: Diagnosis not present

## 2017-01-09 DIAGNOSIS — M4802 Spinal stenosis, cervical region: Secondary | ICD-10-CM | POA: Diagnosis not present

## 2017-01-11 ENCOUNTER — Telehealth: Payer: Self-pay | Admitting: Neurology

## 2017-01-11 NOTE — Telephone Encounter (Signed)
She can if she finds it symptommatically helps her with the double vision but doesn't have to wear it.  It won't actually help the disease.  She has seen opth and can ask her ophthalmologist as well

## 2017-01-11 NOTE — Telephone Encounter (Signed)
Patient called - her double vision is getting worse which she discussed at her last visit with you.  She wasn't sure if she was really supposed to use an eye patch to help with this.  Please advise.

## 2017-01-11 NOTE — Telephone Encounter (Signed)
PT called and would like a call back and she will be available after 1PM today/Dawn CB# [phone]

## 2017-01-11 NOTE — Telephone Encounter (Signed)
Patient made aware.

## 2017-01-14 DIAGNOSIS — Z96619 Presence of unspecified artificial shoulder joint: Secondary | ICD-10-CM | POA: Diagnosis not present

## 2017-01-14 DIAGNOSIS — R42 Dizziness and giddiness: Secondary | ICD-10-CM | POA: Diagnosis not present

## 2017-01-14 DIAGNOSIS — F329 Major depressive disorder, single episode, unspecified: Secondary | ICD-10-CM | POA: Diagnosis not present

## 2017-01-14 DIAGNOSIS — M5416 Radiculopathy, lumbar region: Secondary | ICD-10-CM | POA: Diagnosis not present

## 2017-01-14 DIAGNOSIS — R32 Unspecified urinary incontinence: Secondary | ICD-10-CM | POA: Diagnosis not present

## 2017-01-14 DIAGNOSIS — E119 Type 2 diabetes mellitus without complications: Secondary | ICD-10-CM | POA: Diagnosis not present

## 2017-01-14 DIAGNOSIS — M6250 Muscle wasting and atrophy, not elsewhere classified, unspecified site: Secondary | ICD-10-CM | POA: Diagnosis not present

## 2017-01-14 DIAGNOSIS — I10 Essential (primary) hypertension: Secondary | ICD-10-CM | POA: Diagnosis not present

## 2017-01-14 DIAGNOSIS — M4802 Spinal stenosis, cervical region: Secondary | ICD-10-CM | POA: Diagnosis not present

## 2017-01-14 DIAGNOSIS — M47812 Spondylosis without myelopathy or radiculopathy, cervical region: Secondary | ICD-10-CM | POA: Diagnosis not present

## 2017-01-14 DIAGNOSIS — Z7982 Long term (current) use of aspirin: Secondary | ICD-10-CM | POA: Diagnosis not present

## 2017-01-14 DIAGNOSIS — Z6838 Body mass index (BMI) 38.0-38.9, adult: Secondary | ICD-10-CM | POA: Diagnosis not present

## 2017-01-21 DIAGNOSIS — Z96619 Presence of unspecified artificial shoulder joint: Secondary | ICD-10-CM | POA: Diagnosis not present

## 2017-01-21 DIAGNOSIS — F329 Major depressive disorder, single episode, unspecified: Secondary | ICD-10-CM | POA: Diagnosis not present

## 2017-01-21 DIAGNOSIS — I10 Essential (primary) hypertension: Secondary | ICD-10-CM | POA: Diagnosis not present

## 2017-01-21 DIAGNOSIS — M4802 Spinal stenosis, cervical region: Secondary | ICD-10-CM | POA: Diagnosis not present

## 2017-01-21 DIAGNOSIS — R42 Dizziness and giddiness: Secondary | ICD-10-CM | POA: Diagnosis not present

## 2017-01-21 DIAGNOSIS — M47812 Spondylosis without myelopathy or radiculopathy, cervical region: Secondary | ICD-10-CM | POA: Diagnosis not present

## 2017-01-21 DIAGNOSIS — M6250 Muscle wasting and atrophy, not elsewhere classified, unspecified site: Secondary | ICD-10-CM | POA: Diagnosis not present

## 2017-01-21 DIAGNOSIS — M5416 Radiculopathy, lumbar region: Secondary | ICD-10-CM | POA: Diagnosis not present

## 2017-01-21 DIAGNOSIS — E119 Type 2 diabetes mellitus without complications: Secondary | ICD-10-CM | POA: Diagnosis not present

## 2017-01-21 DIAGNOSIS — Z7982 Long term (current) use of aspirin: Secondary | ICD-10-CM | POA: Diagnosis not present

## 2017-01-21 DIAGNOSIS — R32 Unspecified urinary incontinence: Secondary | ICD-10-CM | POA: Diagnosis not present

## 2017-01-21 DIAGNOSIS — Z6838 Body mass index (BMI) 38.0-38.9, adult: Secondary | ICD-10-CM | POA: Diagnosis not present

## 2017-01-25 DIAGNOSIS — H5010 Unspecified exotropia: Secondary | ICD-10-CM | POA: Diagnosis not present

## 2017-01-28 ENCOUNTER — Telehealth: Payer: Self-pay | Admitting: Neurology

## 2017-01-28 NOTE — Telephone Encounter (Signed)
Patient needs to talk to someone about medication please call [phone]

## 2017-01-28 NOTE — Telephone Encounter (Signed)
Spoke with patient but she was at the [facility]. Will call back later per her request.

## 2017-01-29 NOTE — Telephone Encounter (Signed)
Left message on machine for patient to call back.

## 2017-01-31 DIAGNOSIS — Z471 Aftercare following joint replacement surgery: Secondary | ICD-10-CM | POA: Diagnosis not present

## 2017-01-31 DIAGNOSIS — Z96611 Presence of right artificial shoulder joint: Secondary | ICD-10-CM | POA: Diagnosis not present

## 2017-01-31 DIAGNOSIS — S42294D Other nondisplaced fracture of upper end of right humerus, subsequent encounter for fracture with routine healing: Secondary | ICD-10-CM | POA: Diagnosis not present

## 2017-02-05 DIAGNOSIS — E782 Mixed hyperlipidemia: Secondary | ICD-10-CM | POA: Diagnosis not present

## 2017-02-05 DIAGNOSIS — I10 Essential (primary) hypertension: Secondary | ICD-10-CM | POA: Diagnosis not present

## 2017-02-05 DIAGNOSIS — Z79899 Other long term (current) drug therapy: Secondary | ICD-10-CM | POA: Diagnosis not present

## 2017-02-06 DIAGNOSIS — M47812 Spondylosis without myelopathy or radiculopathy, cervical region: Secondary | ICD-10-CM | POA: Diagnosis not present

## 2017-02-06 DIAGNOSIS — M542 Cervicalgia: Secondary | ICD-10-CM | POA: Diagnosis not present

## 2017-02-06 DIAGNOSIS — M4802 Spinal stenosis, cervical region: Secondary | ICD-10-CM | POA: Diagnosis not present

## 2017-02-06 DIAGNOSIS — I10 Essential (primary) hypertension: Secondary | ICD-10-CM | POA: Diagnosis not present

## 2017-02-07 DIAGNOSIS — E782 Mixed hyperlipidemia: Secondary | ICD-10-CM | POA: Diagnosis not present

## 2017-02-07 DIAGNOSIS — F339 Major depressive disorder, recurrent, unspecified: Secondary | ICD-10-CM | POA: Diagnosis not present

## 2017-02-07 DIAGNOSIS — I10 Essential (primary) hypertension: Secondary | ICD-10-CM | POA: Diagnosis not present

## 2017-02-07 DIAGNOSIS — E1140 Type 2 diabetes mellitus with diabetic neuropathy, unspecified: Secondary | ICD-10-CM | POA: Diagnosis not present

## 2017-02-07 DIAGNOSIS — Z6841 Body Mass Index (BMI) 40.0 and over, adult: Secondary | ICD-10-CM | POA: Diagnosis not present

## 2017-02-07 DIAGNOSIS — H5010 Unspecified exotropia: Secondary | ICD-10-CM | POA: Diagnosis not present

## 2017-02-08 DIAGNOSIS — Z6838 Body mass index (BMI) 38.0-38.9, adult: Secondary | ICD-10-CM | POA: Diagnosis not present

## 2017-02-08 DIAGNOSIS — M6250 Muscle wasting and atrophy, not elsewhere classified, unspecified site: Secondary | ICD-10-CM | POA: Diagnosis not present

## 2017-02-08 DIAGNOSIS — M47812 Spondylosis without myelopathy or radiculopathy, cervical region: Secondary | ICD-10-CM | POA: Diagnosis not present

## 2017-02-08 DIAGNOSIS — I10 Essential (primary) hypertension: Secondary | ICD-10-CM | POA: Diagnosis not present

## 2017-02-08 DIAGNOSIS — M4802 Spinal stenosis, cervical region: Secondary | ICD-10-CM | POA: Diagnosis not present

## 2017-02-08 DIAGNOSIS — Z7982 Long term (current) use of aspirin: Secondary | ICD-10-CM | POA: Diagnosis not present

## 2017-02-08 DIAGNOSIS — Z96619 Presence of unspecified artificial shoulder joint: Secondary | ICD-10-CM | POA: Diagnosis not present

## 2017-02-08 DIAGNOSIS — M5416 Radiculopathy, lumbar region: Secondary | ICD-10-CM | POA: Diagnosis not present

## 2017-02-08 DIAGNOSIS — R42 Dizziness and giddiness: Secondary | ICD-10-CM | POA: Diagnosis not present

## 2017-02-08 DIAGNOSIS — E119 Type 2 diabetes mellitus without complications: Secondary | ICD-10-CM | POA: Diagnosis not present

## 2017-02-08 DIAGNOSIS — F329 Major depressive disorder, single episode, unspecified: Secondary | ICD-10-CM | POA: Diagnosis not present

## 2017-02-08 DIAGNOSIS — R32 Unspecified urinary incontinence: Secondary | ICD-10-CM | POA: Diagnosis not present

## 2017-02-14 ENCOUNTER — Ambulatory Visit: Payer: PPO | Admitting: Neurology

## 2017-02-15 ENCOUNTER — Emergency Department (HOSPITAL_BASED_OUTPATIENT_CLINIC_OR_DEPARTMENT_OTHER)
Admission: EM | Admit: 2017-02-15 | Discharge: 2017-02-15 | Disposition: A | Discharge status: Home / Self Care | Payer: PPO | Attending: Physician Assistant | Admitting: Physician Assistant

## 2017-02-15 ENCOUNTER — Emergency Department (HOSPITAL_BASED_OUTPATIENT_CLINIC_OR_DEPARTMENT_OTHER): Payer: PPO

## 2017-02-15 ENCOUNTER — Encounter (HOSPITAL_BASED_OUTPATIENT_CLINIC_OR_DEPARTMENT_OTHER): Payer: Self-pay | Admitting: *Deleted

## 2017-02-15 DIAGNOSIS — S0993XA Unspecified injury of face, initial encounter: Secondary | ICD-10-CM | POA: Diagnosis not present

## 2017-02-15 DIAGNOSIS — Y9301 Activity, walking, marching and hiking: Secondary | ICD-10-CM | POA: Insufficient documentation

## 2017-02-15 DIAGNOSIS — S0083XA Contusion of other part of head, initial encounter: Secondary | ICD-10-CM | POA: Diagnosis not present

## 2017-02-15 DIAGNOSIS — Y929 Unspecified place or not applicable: Secondary | ICD-10-CM | POA: Diagnosis not present

## 2017-02-15 DIAGNOSIS — W1809XA Striking against other object with subsequent fall, initial encounter: Secondary | ICD-10-CM | POA: Diagnosis not present

## 2017-02-15 DIAGNOSIS — S0990XA Unspecified injury of head, initial encounter: Secondary | ICD-10-CM | POA: Diagnosis not present

## 2017-02-15 DIAGNOSIS — I10 Essential (primary) hypertension: Secondary | ICD-10-CM | POA: Insufficient documentation

## 2017-02-15 DIAGNOSIS — Z7984 Long term (current) use of oral hypoglycemic drugs: Secondary | ICD-10-CM | POA: Insufficient documentation

## 2017-02-15 DIAGNOSIS — Z7982 Long term (current) use of aspirin: Secondary | ICD-10-CM | POA: Diagnosis not present

## 2017-02-15 DIAGNOSIS — S60221A Contusion of right hand, initial encounter: Secondary | ICD-10-CM | POA: Diagnosis not present

## 2017-02-15 DIAGNOSIS — Y999 Unspecified external cause status: Secondary | ICD-10-CM | POA: Diagnosis not present

## 2017-02-15 DIAGNOSIS — Z79899 Other long term (current) drug therapy: Secondary | ICD-10-CM | POA: Insufficient documentation

## 2017-02-15 DIAGNOSIS — F0390 Unspecified dementia without behavioral disturbance: Secondary | ICD-10-CM | POA: Diagnosis not present

## 2017-02-15 DIAGNOSIS — W19XXXA Unspecified fall, initial encounter: Secondary | ICD-10-CM

## 2017-02-15 DIAGNOSIS — M7989 Other specified soft tissue disorders: Secondary | ICD-10-CM | POA: Diagnosis not present

## 2017-02-15 DIAGNOSIS — E119 Type 2 diabetes mellitus without complications: Secondary | ICD-10-CM | POA: Insufficient documentation

## 2017-02-15 NOTE — ED Triage Notes (Signed)
Patient states she was walking with her walker and the walker rolled out from under her and she fell.  Patient fell onto her knees and struck the right side of her face on the wall.  Denies loc.  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

## 2017-02-15 NOTE — ED Notes (Signed)
PTAR CALLED  °

## 2017-02-15 NOTE — ED Notes (Signed)
Pt resting comfortably, bruised area noted on Rt hand with min swelling noted, ice pack provided. Pt states she has no pain at rt hand, good grip noted. Bruised area also noted at Rt eye area, pt voices no complaints at this site as well.

## 2017-02-15 NOTE — ED Provider Notes (Signed)
[facility] DEPT [facility] Provider Note   CSN: [id number] Arrival date & time: 02/15/17  1106     History   Chief Complaint Chief Complaint  Patient presents with  . Fall    HPI [name] MCCLEESE is a 66 y.o. female.  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 Patient has multiple falls. She uses a walker. This was typical for her usual falls. Patient has minimal bruising to around the right eye in the right hand. Patient did not lose consciousness no nausea vomiting confusion or dizziness. Recent tetanus.  Past Medical History:  Diagnosis Date  . Arthritis    fingers,right shoulder  . 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 Chronic lumbar radiculopathy    L5- S1  right leg  . Depression   . Dizziness   . Frequency of urination   . Hard of hearing   . History of kidney stones 05/12/15   surgery   . HTN (hypertension)   . Hyperlipidemia   . 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 Numbness and tingling in hands   . OSA (obstructive sleep apnea)    moderate OSA per study 11-22-2007--  refused CPAP but used oxygen for 6 months at night,  states due to wt loss stopped using oxygen  . Polyneuropathy, diabetic ([place])    WALKS W/ CANE FOR BALANCE  . Right ureteral stone   . Shortness of breath dyspnea   . SUI (stress urinary incontinence, female)   . Type 2 diabetes mellitus (HCC)    Type II - Diet controlled  . Urinary incontinence   . Wears glasses     Patient Active Problem List   Diagnosis Date Noted  . 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 Cervical stenosis of spinal canal 03/23/2016  . OSA on CPAP 12/14/2015  . Dizziness and giddiness 11/24/2013  . Diabetes type 2, uncontrolled ([place]) 11/24/2013  . Severe obesity (BMI >= 40) ([place]) 11/24/2013  . Lumbar radiculopathy 11/24/2013    Past Surgical History:  Procedure Laterality Date  . ANTERIOR  CERVICAL DECOMP/DISCECTOMY FUSION N/A 03/23/2016   Procedure: Cervical Four-Five, Cervical Five-Six, Cervical Six-Seven Anterior cervical decompression/diskectomy/fusion;  Surgeon: Leeroy Cha, MD;  Location: [facility];  Service: Neurosurgery;  Laterality: N/A;  C4-5 C5-6 C6-7 Anterior cervical decompression/diskectomy/fusion  . CARDIOVASCULAR STRESS TEST  09-30-2007     normal Adenosine study/  no ischemia /  normal LV function and wall motion , ef 82%  . COLONOSCOPY    . CYSTOSCOPY WITH RETROGRADE PYELOGRAM, URETEROSCOPY AND STENT PLACEMENT Right 05/12/2015   Procedure: CYSTOSCOPY WITH RETROGRADE PYELOGRAM, URETEROSCOPY,STONE EXTRACTION AND STENT PLACEMENT;  Surgeon: Franchot Gallo, MD;  Location: [facility];  Service: Urology;  Laterality: Right;  . EXTRACORPOREAL SHOCK WAVE LITHOTRIPSY Right 08-18-2012  . HOLMIUM LASER APPLICATION Right 01/10/6009   Procedure: HOLMIUM LASER APPLICATION;  Surgeon: Franchot Gallo, MD;  Location: [facility];  Service: Urology;  Laterality: Right;  . ORIF HUMERUS FRACTURE Right 07/20/2016   Procedure: OPEN REDUCTION INTERNAL FIXATION (ORIF) PROXIMAL HUMERUS FRACTURE VS REVERSE TOTAL SHOULDER ARTHROPLASTY;  Surgeon: Netta Cedars, MD;  Location: [facility];  Service: Orthopedics;  Laterality: Right;  . SHOULDER ARTHROSCOPY Right 07/2016  . TRANSTHORACIC ECHOCARDIOGRAM  09-23-2007   pseudonormal LV filling pattern,  ef 65-70%/  trivial AR/  mild LAE  . TUBAL  LIGATION  1985    OB History    No data available       Home Medications    Prior to Admission medications   Medication Sig Start Date End Date Taking? Authorizing Provider  aspirin 81 MG tablet Take 81 mg by mouth daily.   Yes Historical Provider, MD  azelastine (ASTELIN) 0.1 % nasal spray Place 1 spray into both nostrils 2 (two) times daily.  12/23/14  Yes Historical Provider, MD  carbidopa-levodopa (SINEMET IR) 25-100 MG tablet TAKE 2 TABLETS BY MOUTH EVERY MORNING  AND 2 TABS AT LUNCH AND 1 TABLET AT BEDTIME 12/04/16  Yes Rebecca S Tat, DO  Cholecalciferol (VITAMIN D PO) Take 5,000 Units by mouth daily.   Yes Historical Provider, MD  cyanocobalamin 500 MCG tablet Take 500 mcg by mouth daily.   Yes Historical Provider, MD  docusate sodium (COLACE) 100 MG capsule Take 100 mg by mouth daily as needed for mild constipation.    Yes Historical Provider, MD  escitalopram (LEXAPRO) 10 MG tablet Take 5 mg by mouth daily. 06/08/16  Yes Historical Provider, MD  fenofibrate 160 MG tablet Take 160 mg by mouth every morning.    Yes Historical Provider, MD  fluticasone (FLONASE) 50 MCG/ACT nasal spray Place 2 sprays into both nostrils daily.  12/23/14  Yes Historical Provider, MD  glucosamine-chondroitin 500-400 MG tablet Take 1 tablet by mouth 2 (two) times daily.   Yes Historical Provider, MD  loratadine (CLARITIN) 10 MG tablet Take 10 mg by mouth every morning.   Yes Historical Provider, MD  losartan (COZAAR) 100 MG tablet Take 50 mg by mouth every morning.   Yes Historical Provider, MD  Melatonin 5 MG CAPS Take 1 capsule by mouth at bedtime as needed. For sleep   Yes Historical Provider, MD  Menthol, Topical Analgesic, (BIOFREEZE) 4 % GEL Apply topically.   Yes Historical Provider, MD  metFORMIN (GLUCOPHAGE) 500 MG tablet Take by mouth daily with breakfast.   Yes Historical Provider, MD  montelukast (SINGULAIR) 10 MG tablet Take 10 mg by mouth at bedtime.   Yes Historical Provider, MD  nystatin cream (MYCOSTATIN) Apply 1 application topically 2 (two) times daily as needed for dry skin.   Yes Historical Provider, MD  polyethylene glycol (MIRALAX / GLYCOLAX) packet Take 17 g by mouth daily.   Yes Historical Provider, MD  progesterone (PROMETRIUM) 100 MG capsule Take 100 mg by mouth at bedtime. 12/20/14  Yes Historical Provider, MD  Propylene Glycol (SYSTANE BALANCE OP) Place 2 drops into both eyes 2 (two) times daily as needed (dry eyes).    Yes Historical Provider, MD     Family History Family History  Problem Relation Age of Onset  . Heart Problems Father   . Stroke Mother   . Hypertension    . Stroke    . Breast cancer Sister     Social History Social History  Substance Use Topics  . Smoking status: Never Smoker  . Smokeless tobacco: Never Used  . Alcohol use 0.0 oz/week     Comment: 3- 4 times a year     Allergies   Aleve [naproxen] and Zocor [simvastatin]   Review of Systems Review of Systems  Constitutional: Negative for fatigue and fever.  Cardiovascular: Negative for chest pain.  Neurological: Negative for dizziness, speech difficulty, weakness, light-headedness and headaches.     Physical Exam Updated Vital Signs BP (!) 142/65 (BP Location: Right Arm)   Pulse 82   Temp 98.1 F (  36.7 C) (Oral)   Resp 18   Ht 5\' 1"  (1.549 m)   Wt 225 lb (102.1 kg)   SpO2 100%   BMI 42.51 kg/m   Physical Exam  Constitutional: She is oriented to person, place, and time. She appears well-developed and well-nourished.  HENT:  Head: Normocephalic and atraumatic.  Scant bruising around right eye. Extra ocular movements intact without pain.  Eyes: Right eye exhibits no discharge.  Cardiovascular: Normal rate, regular rhythm and normal heart sounds.   No murmur heard. Pulmonary/Chest: Effort normal and breath sounds normal. She has no wheezes. She has no rales.  Abdominal: Soft. She exhibits no distension. There is no tenderness.  Musculoskeletal:  Swelling bruising to the right anterior hand. Good range of motion.  Old bruising to bilateral shins and knees from prior falls.  Neurological: She is oriented to person, place, and time.  Skin: Skin is warm and dry. She is not diaphoretic.  Psychiatric: She has a normal mood and affect.  Nursing note and vitals reviewed.    ED Treatments / Results  Labs (all labs ordered are listed, but only abnormal results are displayed) Labs Reviewed - No data to display  EKG  EKG  Interpretation None       Radiology No results found.  Procedures Procedures (including critical care time)  Medications Ordered in ED Medications - No data to display   Initial Impression / Assessment and Plan / ED Course  I have reviewed the triage vital signs and the nursing notes.  Pertinent labs & imaging results that were available during my care of the patient were reviewed by me and considered in my medical decision making (see chart for details).     Patient is very well-appearing 66 year old female with history of multiple system' type C who is frequent falls presenting today with fall. Patient has scant bruising around her right eye and right hand. We'll get x-ray of the right hand. Right eye, I doubt any intervening  fracture given that her extra ocular movements are intact and do not suspect any entrapment. Patient not on blood thinners. She did not lose consciousness. Has no altered mental status. No not even a headache.  12:39 PM Xray negatrive.  Will discharge back to [place].   Final Clinical Impressions(s) / ED Diagnoses   Final diagnoses:  None    New Prescriptions New Prescriptions   No medications on file     Kenly Xiao Julio Alm, MD 02/15/17 1239

## 2017-02-15 NOTE — Discharge Instructions (Signed)
Please return with any headache, confusion, vomiting dizziness or any concners.  Your xray of your hand was normal.

## 2017-02-15 NOTE — ED Notes (Signed)
PTAR notified to request for transfer back to [facility] on [address] in [place]

## 2017-02-18 ENCOUNTER — Telehealth: Payer: Self-pay | Admitting: Neurology

## 2017-02-18 NOTE — Telephone Encounter (Signed)
Pt saw Dr. Valetta Close on 01/28/17.  Patient presented to him with worsening diplopia.  He recommended prisms and was going to recheck her in 2 weeks.

## 2017-02-21 DIAGNOSIS — M4802 Spinal stenosis, cervical region: Secondary | ICD-10-CM | POA: Diagnosis not present

## 2017-02-21 DIAGNOSIS — Z96619 Presence of unspecified artificial shoulder joint: Secondary | ICD-10-CM | POA: Diagnosis not present

## 2017-02-21 DIAGNOSIS — R32 Unspecified urinary incontinence: Secondary | ICD-10-CM | POA: Diagnosis not present

## 2017-02-21 DIAGNOSIS — R42 Dizziness and giddiness: Secondary | ICD-10-CM | POA: Diagnosis not present

## 2017-02-21 DIAGNOSIS — M47812 Spondylosis without myelopathy or radiculopathy, cervical region: Secondary | ICD-10-CM | POA: Diagnosis not present

## 2017-02-21 DIAGNOSIS — M6250 Muscle wasting and atrophy, not elsewhere classified, unspecified site: Secondary | ICD-10-CM | POA: Diagnosis not present

## 2017-02-21 DIAGNOSIS — M5416 Radiculopathy, lumbar region: Secondary | ICD-10-CM | POA: Diagnosis not present

## 2017-02-21 DIAGNOSIS — Z7982 Long term (current) use of aspirin: Secondary | ICD-10-CM | POA: Diagnosis not present

## 2017-02-21 DIAGNOSIS — F329 Major depressive disorder, single episode, unspecified: Secondary | ICD-10-CM | POA: Diagnosis not present

## 2017-02-21 DIAGNOSIS — E119 Type 2 diabetes mellitus without complications: Secondary | ICD-10-CM | POA: Diagnosis not present

## 2017-02-21 DIAGNOSIS — Z6838 Body mass index (BMI) 38.0-38.9, adult: Secondary | ICD-10-CM | POA: Diagnosis not present

## 2017-02-21 DIAGNOSIS — I10 Essential (primary) hypertension: Secondary | ICD-10-CM | POA: Diagnosis not present

## 2017-02-25 ENCOUNTER — Telehealth: Payer: Self-pay | Admitting: Neurology

## 2017-02-25 DIAGNOSIS — M4802 Spinal stenosis, cervical region: Secondary | ICD-10-CM | POA: Diagnosis not present

## 2017-02-25 DIAGNOSIS — Z96619 Presence of unspecified artificial shoulder joint: Secondary | ICD-10-CM | POA: Diagnosis not present

## 2017-02-25 DIAGNOSIS — E1165 Type 2 diabetes mellitus with hyperglycemia: Secondary | ICD-10-CM | POA: Diagnosis not present

## 2017-02-25 DIAGNOSIS — R42 Dizziness and giddiness: Secondary | ICD-10-CM | POA: Diagnosis not present

## 2017-02-25 DIAGNOSIS — M5416 Radiculopathy, lumbar region: Secondary | ICD-10-CM | POA: Diagnosis not present

## 2017-02-25 DIAGNOSIS — G4733 Obstructive sleep apnea (adult) (pediatric): Secondary | ICD-10-CM | POA: Diagnosis not present

## 2017-02-25 NOTE — Telephone Encounter (Signed)
Spoke with patient. She states she has been having increased tremor in her left hand lately and increased falls.   Patient is aware she is not to be walking. If she is transferring she should have someone with her. All falls have been a result of being up with a walker and no help.  She states she just received her power wheelchair and again has stated she understands she should not be walking.   Patient states since moving to [facility] care that she is receiving her Carbidopa Levodopa about 10-15 minutes after breakfast and lunch and her evening dose around 8-9 pm.  I made her aware that taking this medication with protein may be lowering the efficacy and I will send an order to change dosage times of Levodopa to 30 minutes before breakfast, lunch, and dinner to see if that helps with tremor.  She agrees with this plan. Order sent to [facility] at [phone] with confirmation received.

## 2017-02-25 NOTE — Telephone Encounter (Signed)
That is perfect plan.  Thank you!

## 2017-02-25 NOTE — Telephone Encounter (Signed)
Left message on machine for patient to call back.

## 2017-02-25 NOTE — Telephone Encounter (Signed)
Patient wants to talk to someone about the problems she is having in her hands she does not see Dr Tat until June

## 2017-02-28 ENCOUNTER — Ambulatory Visit: Payer: BLUE CROSS/BLUE SHIELD | Admitting: Neurology

## 2017-03-05 ENCOUNTER — Telehealth: Payer: Self-pay | Admitting: Neurology

## 2017-03-05 NOTE — Telephone Encounter (Signed)
Patient wants you to call her about the dosage of the carbidopa levodopa to confirm it with the asst living place

## 2017-03-05 NOTE — Telephone Encounter (Signed)
Spoke with patient and confirmed dosage of Levodopa at 2 - 30 minutes before breakfast, 2 - 30 minutes before lunch, 1 - 30 minutes before dinner.

## 2017-03-14 ENCOUNTER — Telehealth: Payer: Self-pay | Admitting: Neurology

## 2017-03-14 NOTE — Telephone Encounter (Signed)
Spoke with patient and made her aware she needs 24 hr care. She states that the independent living is cheaper and she can afford a night nurse. I did make her aware she will need a nurse during the day as well.

## 2017-03-14 NOTE — Telephone Encounter (Signed)
Caller: PT  Urgent? No  Reason for the call: PT wanted to let you know she is moving from [facility] to [facility] and wanted to know if that is ok

## 2017-03-14 NOTE — Telephone Encounter (Signed)
Patient was advised before that she needed care because she should not be walking alone. Please advise on the transfer.

## 2017-03-14 NOTE — Telephone Encounter (Signed)
She needs 24 hour per day care

## 2017-03-27 DIAGNOSIS — M5416 Radiculopathy, lumbar region: Secondary | ICD-10-CM | POA: Diagnosis not present

## 2017-03-27 DIAGNOSIS — E1165 Type 2 diabetes mellitus with hyperglycemia: Secondary | ICD-10-CM | POA: Diagnosis not present

## 2017-03-27 DIAGNOSIS — G4733 Obstructive sleep apnea (adult) (pediatric): Secondary | ICD-10-CM | POA: Diagnosis not present

## 2017-03-27 DIAGNOSIS — G903 Multi-system degeneration of the autonomic nervous system: Secondary | ICD-10-CM | POA: Diagnosis not present

## 2017-03-27 DIAGNOSIS — R42 Dizziness and giddiness: Secondary | ICD-10-CM | POA: Diagnosis not present

## 2017-03-27 DIAGNOSIS — M4802 Spinal stenosis, cervical region: Secondary | ICD-10-CM | POA: Diagnosis not present

## 2017-03-27 DIAGNOSIS — Z96619 Presence of unspecified artificial shoulder joint: Secondary | ICD-10-CM | POA: Diagnosis not present

## 2017-04-11 ENCOUNTER — Telehealth: Payer: Self-pay | Admitting: Neurology

## 2017-04-11 NOTE — Telephone Encounter (Signed)
They offer exercise classes at her facility (chair classes).  I let her know this would be great as long as she remains sitting.  She will call with any other questions.

## 2017-04-11 NOTE — Telephone Encounter (Signed)
Patient needs to talk to someone about doing some exercise

## 2017-04-11 NOTE — Telephone Encounter (Signed)
Left message on machine for patient to call back.

## 2017-04-15 DIAGNOSIS — N200 Calculus of kidney: Secondary | ICD-10-CM | POA: Diagnosis not present

## 2017-04-15 DIAGNOSIS — N3281 Overactive bladder: Secondary | ICD-10-CM | POA: Diagnosis not present

## 2017-04-18 DIAGNOSIS — E119 Type 2 diabetes mellitus without complications: Secondary | ICD-10-CM | POA: Diagnosis not present

## 2017-04-18 DIAGNOSIS — F339 Major depressive disorder, recurrent, unspecified: Secondary | ICD-10-CM | POA: Diagnosis not present

## 2017-04-18 DIAGNOSIS — R296 Repeated falls: Secondary | ICD-10-CM | POA: Diagnosis not present

## 2017-04-18 DIAGNOSIS — R278 Other lack of coordination: Secondary | ICD-10-CM | POA: Diagnosis not present

## 2017-04-18 DIAGNOSIS — M6281 Muscle weakness (generalized): Secondary | ICD-10-CM | POA: Diagnosis not present

## 2017-04-18 DIAGNOSIS — G903 Multi-system degeneration of the autonomic nervous system: Secondary | ICD-10-CM | POA: Diagnosis not present

## 2017-04-22 DIAGNOSIS — R0789 Other chest pain: Secondary | ICD-10-CM | POA: Diagnosis not present

## 2017-05-01 ENCOUNTER — Telehealth: Payer: Self-pay | Admitting: Neurology

## 2017-05-01 NOTE — Telephone Encounter (Signed)
PT called and said she wants to know what Dr Tat recommends for constipation

## 2017-05-01 NOTE — Telephone Encounter (Signed)
Patient made aware.

## 2017-05-01 NOTE — Telephone Encounter (Signed)
Left message on machine for patient to call back.  To let her know the following.   Constipation and Parkinson's disease:   [place] recipe for constipation in Parkinsons Disease:  -1 cup of bran, 2 cups of applesauce in 1 cup of prune juice  Increase fiber intake (Metamucil,vegetables)  Regular, moderate exercise can be beneficial.  Avoid medications causing constipation, such as medications like antacids with calcium or magnesium  Laxative overuse should be avoided.  Stool softeners (Colace) can help with chronic constipation.

## 2017-05-09 ENCOUNTER — Ambulatory Visit: Payer: PPO | Admitting: Neurology

## 2017-05-10 DIAGNOSIS — I10 Essential (primary) hypertension: Secondary | ICD-10-CM | POA: Diagnosis not present

## 2017-05-10 DIAGNOSIS — G1220 Motor neuron disease, unspecified: Secondary | ICD-10-CM | POA: Diagnosis not present

## 2017-05-10 DIAGNOSIS — E1140 Type 2 diabetes mellitus with diabetic neuropathy, unspecified: Secondary | ICD-10-CM | POA: Diagnosis not present

## 2017-05-10 DIAGNOSIS — Z6841 Body Mass Index (BMI) 40.0 and over, adult: Secondary | ICD-10-CM | POA: Diagnosis not present

## 2017-05-10 DIAGNOSIS — H5010 Unspecified exotropia: Secondary | ICD-10-CM | POA: Diagnosis not present

## 2017-05-13 DIAGNOSIS — R278 Other lack of coordination: Secondary | ICD-10-CM | POA: Diagnosis not present

## 2017-05-13 DIAGNOSIS — E119 Type 2 diabetes mellitus without complications: Secondary | ICD-10-CM | POA: Diagnosis not present

## 2017-05-13 DIAGNOSIS — F339 Major depressive disorder, recurrent, unspecified: Secondary | ICD-10-CM | POA: Diagnosis not present

## 2017-05-13 DIAGNOSIS — R296 Repeated falls: Secondary | ICD-10-CM | POA: Diagnosis not present

## 2017-05-13 DIAGNOSIS — M6281 Muscle weakness (generalized): Secondary | ICD-10-CM | POA: Diagnosis not present

## 2017-05-13 DIAGNOSIS — G903 Multi-system degeneration of the autonomic nervous system: Secondary | ICD-10-CM | POA: Diagnosis not present

## 2017-05-17 ENCOUNTER — Other Ambulatory Visit: Payer: Self-pay

## 2017-05-18 NOTE — Patient Outreach (Signed)
    This RNCM was unsuccessful in making telephone contact with patient for this HTA Screening call.  Plan: Make another attempt to contact patient in the next 21 days.

## 2017-05-20 DIAGNOSIS — R0789 Other chest pain: Secondary | ICD-10-CM | POA: Diagnosis not present

## 2017-05-22 NOTE — Progress Notes (Signed)
[name] [name] was seen today in the movement disorders clinic for neurologic consultation at the request of [name], MD.    The patient presents for a second opinion regarding falls and balance difficulties.  She has been seeing Dr. [name] since January, 2015 and most recently saw him on 11/25/2015 and saw his nurse practitioner on 12/14/2015.  I have taken a detailed review of the records from [facility] neurology.  [name] [name] initial complaint in January, 2015 was of dizziness and balance change.  By November, 2015, the patient's balance change and falls had become such a problem that Dr. [name] told the patient that he thought it was unsafe for [name] [name] to live alone.  He noted that the patient was very short stepped in [name] [name] gait and she was off balance and antalgic.  Over the course of time, she continued to have repetitive falls.  As of August, 2016 the patient began to complain of drooling.  In November, 2016, the patient began to complain of tremor when holding a cup, which she states has increased.  01/26/16 update:  The patient presents today for follow-up.  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  She has continued to have falls and fell on February 23 ended up in the emergency room.  They did not do a CT of the brain.  Fortunately, she did not lose consciousness with that fall.  She did fall 3 times the day after that but none since.  She has had some dizziness with the medication.  She did have labwork at our last visit.  [name] [name] zinc was normal.  Vitamin D was normal.  Celiac panel was normal.  RPR was negative.  Vitamin B12 was slightly low at 313 and I asked [name] [name] to start a B12 supplement.  Folate was normal.  The patient wanted to follow-up here a little bit earlier.  States that the diplopia is getting worse.  She had an MRI of the cervical spine on 01/16/16 that I reviewed.  There is degenerative changes and significant NFS and  C5 on the L, bilaterally at C6 and 7.  03/13/16 update:  The patient presents today for follow-up.  She is accompanied by [name] [name] in-home caregiver who supplements the history.  [name] [name] caregiver now comes a few hours to days a week.  I have reviewed records since last visit.  The patient saw Dr. [name] on 02/06/2016 and his notes stated that there was no doubt that the patient had cervical stenosis but he was not sure how much the surgery would help.  He told the patient to follow back up with me and my recommendations.  I subsequently got a call from the patient's primary care physician and she asked me the same question and I stated that while the surgery may help [name] [name] pain, it was not going to help [name] [name] falls, shuffling, or diplopia and the anesthesia could actually set the [place] back.  Last visit, I did increase [name] [name] carbidopa/levodopa 25/100-2 tablets 3 times per day.  I offered to change [name] [name] to the extended release to see if that would help the dizziness but she did not want to do that.  She eventually called me and stated that she was dizzy and stated that [name] [name] primary care physician said that she may need discontinue the medication.  I asked [name] [name] if she was sure that it was from the medication because dizziness was one of [name] [name]  presenting complaints at onset in 2015.  I did tell [name] [name] that she could hold the medication and see if the dizziness changed.  The patient states that she d/c the medication.  She states that she has fallen 4 times, 2 prior to the d/c of levodopa and 2 after.  She really noticed no change in the dizziness and in fact states that she is more dizzy now.  The patient did see Dr. [name] on April 20 in follow-up for [name] [name] sleep apnea.  She states that she does not have to see Dr. [name] for another year and it was just recommended that she use [name] [name] CPAP more and try melatonin.  06/15/16 update:  The patient presents today for follow-up.  This patient is accompanied in the office by [name] [name] caregiver who supplements  the history.  She has a caregiver 2 days a week.   I have reviewed multiple records made available to me.  The patient had anterior cervical discectomy at the C4-C5, C5-C6, and C6-C7 levels on May 12.  She was discharged from the hospital on May 14.  She had no surgical complications.  She is on carbidopa/levodopa 25/100, 2 tablets in the morning, 2 in the afternoon and one in the evening.  She has had falls since surgery, the last of which was 7/26.  She was bent over to get dish washing detergent and fell on [name] [name] knees.  She also fell on 7/5 and she tried to get up from the chair and [name] [name] foot got caught.  She fell on 6/15 and she bruised [name] [name] arm.  With the falls, the walker was present but she wasn't necessarily leaning on it at the time.  She is in PT right now 2-3 days a week.    10/16/16 update:  Pt presents for follow up.  She is accompanied by [name] [name] caregiver and sister in law who supplement the history.She has MSA-C.  She is on carbidopa/levodopa 25/100, 2/2/1.  Asked me about driving last visit and I was leery because of diplopia so told [name] [name] that she needed an ophthalmology evaluation.  She did go on 09/11/16 and they sent me handwritten notes but driving doesn't appear to have been addressed. She is supposed to get new glasses today.  She has not been driving much lately because of recent surgery, but admits that she did recently get released by [name] [name] surgeon to drive and took a "test drive."  The records that were made available to me were reviewed.  Fell in August and sustained a humerus fx.  At that time, I recommend she stop walking all together and remain in a wheelchair at all times.  This was told to patient and [name] [name] son (on the phone).  She underwent ORIF of the fx and a reverse total shoulder on 07/20/2016.  She had 24 hour/day care until this week and it was cut back on this week.  She admits that she still has been walking.  Getting choked on green tea/liquids.  "I'm having more laughter than I  should."  12/10/16 update:  Patient presents today for mobility evaluation.  She is accompanied by [name] [name] caregiver.  A representative from [facility] home [facility] is also present.  The patient remains on carbidopa/levodopa 25/100, 2/2/1.  She did have a swallow study on 10/26/2016 that demonstrated mild oropharyngeal dysphagia, but no change the dietary recommendations were made at this time.  Regular diet with thin liquids was recommended.  Last visit and several times since, I talked  to [name] [name] about the fact that I do not want [name] [name] living alone and that I want [name] [name] using a wheelchair at all times.  She has not been doing that and actually walked into the office today.  She states that she fell on jan 20 while trying to clean up something on the floor.  Had EMS eval [name] [name] that day.  No LOC and no change in MS since then, which [name] [name] caregiver agrees with.  She is moving this weekend to [facility] in [facility].  [name] [name] meds will be distributed.  She will get bathing assist.   05/23/17 update:  Patient presents for follow-up, accompanied by [name] [name] sister-in-law and brother who supplements the history.  She is on carbidopa/levodopa 25/100, 2 tablets in the morning, 2 in the afternoon and one in the evening.  We did a mobility evaluation last visit and talked about the importance of staying seated at all times.  Unfortunately, she has not done that.  She presented to the hospital after a fall on 02/15/2017.  She did not hit [name] [name] head.  I did get a note from Dr. Valetta Close dated 01/28/2017.  She saw him for worsening diplopia.  He recommended prisms.  They didn't help and she is supposed to see neuroopthalmology.   She moved again from [place] and is no longer in assisted living but is in retired living.  She has already had rib fx in [name] [name] new place.  She has sitters at night and in the AM.  She has assistance with bathing and dressing and morning meals.      ALLERGIES:   Allergies  Allergen Reactions  . Aleve [Naproxen] Hives, Shortness  Of Breath and Rash    Pt Avoids All Nsaids  . Zocor [Simvastatin] Other (See Comments)    "weird feeling all over body"    CURRENT MEDICATIONS:  Outpatient Encounter Prescriptions as of 05/23/2017  Medication Sig  . aspirin 81 MG tablet Take 81 mg by mouth daily.  Marland Kitchen azelastine (ASTELIN) 0.1 % nasal spray Place 1 spray into both nostrils 2 (two) times daily.   . carbidopa-levodopa (SINEMET IR) 25-100 MG tablet TAKE 2 TABLETS BY MOUTH EVERY MORNING AND 2 TABS AT LUNCH AND 1 TABLET AT BEDTIME  . Cholecalciferol (VITAMIN D PO) Take 5,000 Units by mouth daily.  . cyanocobalamin 500 MCG tablet Take 500 mcg by mouth daily.  Marland Kitchen docusate sodium (COLACE) 100 MG capsule Take 100 mg by mouth daily as needed for mild constipation.   Marland Kitchen escitalopram (LEXAPRO) 10 MG tablet Take 5 mg by mouth daily.  . fenofibrate 160 MG tablet Take 160 mg by mouth every morning.   . fluticasone (FLONASE) 50 MCG/ACT nasal spray Place 2 sprays into both nostrils daily.   Marland Kitchen glucosamine-chondroitin 500-400 MG tablet Take 1 tablet by mouth 2 (two) times daily.  Marland Kitchen loratadine (CLARITIN) 10 MG tablet Take 10 mg by mouth every morning.  Marland Kitchen losartan (COZAAR) 100 MG tablet Take 100 mg by mouth every morning.   . Melatonin 5 MG CAPS Take 1 capsule by mouth at bedtime as needed. For sleep  . Menthol, Topical Analgesic, (BIOFREEZE) 4 % GEL Apply topically.  . metFORMIN (GLUCOPHAGE) 500 MG tablet Take 250 mg by mouth daily with breakfast.   . montelukast (SINGULAIR) 10 MG tablet Take 10 mg by mouth at bedtime.  Marland Kitchen nystatin cream (MYCOSTATIN) Apply 1 application topically 2 (two) times daily as needed for dry skin.  . polyethylene glycol (MIRALAX / GLYCOLAX) packet Take  17 g by mouth daily.  . progesterone (PROMETRIUM) 100 MG capsule Take 100 mg by mouth at bedtime.  Marland Kitchen Propylene Glycol (SYSTANE BALANCE OP) Place 2 drops into both eyes 2 (two) times daily as needed (dry eyes).    No facility-administered encounter medications on file as of  05/23/2017.     PAST MEDICAL HISTORY:   Past Medical History:  Diagnosis Date  . Arthritis    fingers,right shoulder  . 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 Chronic lumbar radiculopathy    L5- S1  right leg  . Depression   . Dizziness   . Frequency of urination   . Hard of hearing   . History of kidney stones 05/12/15   surgery   . HTN (hypertension)   . Hyperlipidemia   . 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 Numbness and tingling in hands   . OSA (obstructive sleep apnea)    moderate OSA per study 11-22-2007--  refused CPAP but used oxygen for 6 months at night,  states due to wt loss stopped using oxygen  . Polyneuropathy, diabetic ([place])    WALKS W/ CANE FOR BALANCE  . Right ureteral stone   . Shortness of breath dyspnea   . SUI (stress urinary incontinence, female)   . Type 2 diabetes mellitus (HCC)    Type II - Diet controlled  . Urinary incontinence   . Wears glasses     PAST SURGICAL HISTORY:   Past Surgical History:  Procedure Laterality Date  . ANTERIOR CERVICAL DECOMP/DISCECTOMY FUSION N/A 03/23/2016   Procedure: Cervical Four-Five, Cervical Five-Six, Cervical Six-Seven Anterior cervical decompression/diskectomy/fusion;  Surgeon: Leeroy Cha, MD;  Location: [facility];  Service: Neurosurgery;  Laterality: N/A;  C4-5 C5-6 C6-7 Anterior cervical decompression/diskectomy/fusion  . CARDIOVASCULAR STRESS TEST  09-30-2007     normal Adenosine study/  no ischemia /  normal LV function and wall motion , ef 82%  . COLONOSCOPY    . CYSTOSCOPY WITH RETROGRADE PYELOGRAM, URETEROSCOPY AND STENT PLACEMENT Right 05/12/2015   Procedure: CYSTOSCOPY WITH RETROGRADE PYELOGRAM, URETEROSCOPY,STONE EXTRACTION AND STENT PLACEMENT;  Surgeon: Franchot Gallo, MD;  Location: [facility];  Service: Urology;  Laterality: Right;  . EXTRACORPOREAL SHOCK WAVE LITHOTRIPSY Right 08-18-2012  . HOLMIUM LASER  APPLICATION Right 1/44/8185   Procedure: HOLMIUM LASER APPLICATION;  Surgeon: Franchot Gallo, MD;  Location: [facility];  Service: Urology;  Laterality: Right;  . ORIF HUMERUS FRACTURE Right 07/20/2016   Procedure: OPEN REDUCTION INTERNAL FIXATION (ORIF) PROXIMAL HUMERUS FRACTURE VS REVERSE TOTAL SHOULDER ARTHROPLASTY;  Surgeon: Netta Cedars, MD;  Location: [facility];  Service: Orthopedics;  Laterality: Right;  . SHOULDER ARTHROSCOPY Right 07/2016  . TRANSTHORACIC ECHOCARDIOGRAM  09-23-2007   pseudonormal LV filling pattern,  ef 65-70%/  trivial AR/  mild LAE  . TUBAL LIGATION  1985    SOCIAL HISTORY:   Social History   Social History  . Marital status: Widowed    Spouse name: N/A  . Number of children: 1  . Years of education: College   Occupational History  . office manager   .  [employer]   Social History Main Topics  . Smoking status: Never Smoker  . Smokeless tobacco: Never Used  . Alcohol use 0.0 oz/week     Comment: 3- 4 times a year  . Drug use: No  . Sexual activity: Not on  file   Other Topics Concern  . Not on file   Social History Narrative   Patient lives at home with [name] [name] dog name "Hot Rod".   Caffeine Use: 2-3 cups of coffee a day        FAMILY HISTORY:   Family Status  Relation Status  . Father Deceased at age 13       heart disease  . Mother Deceased at age 81       stroke  . Sister Alive       asthma  . Sister Deceased       breast cancer  . Sister Deceased       breast cancer  . Unknown (Not Specified)  . Sister (Not Specified)    ROS:  A complete 10 system review of systems was obtained and was unremarkable apart from what is mentioned above.  PHYSICAL EXAMINATION:    VITALS:   Vitals:   05/23/17 1422  BP: 132/76  Pulse: 92  SpO2: 96%     GEN:  The patient appears stated age and is in NAD. HEENT:  Normocephalic.  Ecchymosis on the forehead bilaterally, right more than L.    The mucous membranes are very dry.  The superficial temporal arteries are without ropiness or tenderness.  No fasciculations in tongue. CV:  RRR Lungs:  CTAB Neck/HEME:  There are no carotid bruits bilaterally.  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  Neurological examination:  Orientation: The patient is alert and oriented x3.  Cranial nerves: There is good facial symmetry with the exception of mild L ptosis.  Extraocular muscles are intact but she has some trouble with upgaze.  She has difficulty with smooth pursuit.  She has square wave jerks.   The visual fields are full to confrontational testing. The speech is fluent and just intermittently dysarthric.   Some trouble with the gutteral sounds.  Soft palate rises symmetrically and there is no tongue deviation. Hearing is intact to conversational tone. Sensation: Sensation is intact to light touch throughout Motor: Strength is 5/5 in the bilateral upper and lower extremities.   Shoulder shrug is equal and symmetric.  There is no pronator drift.    Movement examination: Tone: There is normal tone in the bilateral upper extremities.  The tone in the lower extremities is normal.  Abnormal movements: Rare tremor of the left thumb; no dyskinesia; there is abnormal posture of the head with right head tilt and the right ear approximates the right shoulder Coordination:  There is decremation with RAM's, seen with virtually all forms of RAMs on the right, especially toe taps.   Gait and Station: The patient was not ambulated today as I stressed that she was no longer to be walking.    ASSESSMENT/PLAN:  1.  MSA-C  -long discussion with the patient again today.  In a patient who presents with falls, and later develops shuffling, diplopia, cervical dystonia and incoordination on the right, the diagnosis is multiple system atrophy, cerebellar type.    -For now, I would recommend that we continue carbidopa/levodopa 25/100, 2/2/1  -continue to stress NO walking even with walker and  needs WC ONLY.    -add speech therapy 2.  Cervical spinal stenosis and NFS.  -She is status post anterior cervical discectomy from the C4-C7 levels on 03/23/2016. 3.  Dysphagia  -She did have a swallow study on 10/26/2016 that demonstrated mild oropharyngeal dysphagia, but no change the dietary recommendations were made.  Regular diet  with thin liquids was recommended.  Will repeat this study in December. 4.  Mild B12 deficiency  -is on B12 supplement 1031mcg daily 5.  Constipation  -given rancho recipe at previous visits and is managing this well for now 6.  Pseudobulbar laughter  -discussed neudexta and she wishes to hold on this for now. 7.  Much greater than 50% of this visit was spent in counseling with the patient and the family.  Total face to face time:  45 min.  [name] [name] brother was not previously present at any other visit.

## 2017-05-23 ENCOUNTER — Ambulatory Visit (INDEPENDENT_AMBULATORY_CARE_PROVIDER_SITE_OTHER): Payer: PPO | Admitting: Neurology

## 2017-05-23 ENCOUNTER — Encounter: Payer: Self-pay | Admitting: Neurology

## 2017-05-23 VITALS — BP 132/76 | HR 92

## 2017-05-23 DIAGNOSIS — R296 Repeated falls: Secondary | ICD-10-CM | POA: Diagnosis not present

## 2017-05-23 DIAGNOSIS — R1319 Other dysphagia: Secondary | ICD-10-CM | POA: Diagnosis not present

## 2017-05-23 DIAGNOSIS — G903 Multi-system degeneration of the autonomic nervous system: Secondary | ICD-10-CM

## 2017-05-23 DIAGNOSIS — G239 Degenerative disease of basal ganglia, unspecified: Secondary | ICD-10-CM | POA: Diagnosis not present

## 2017-05-23 NOTE — Patient Instructions (Signed)
Constipation and Parkinson's disease:  1.Rancho recipe for constipation in Parkinsons Disease:  -1 cup of unprocessed bran (need to get this at [employer], [employer] or similar type of store), 2 cups of applesauce in 1 cup of prune juice 2.  Increase fiber intake (Metamucil,vegetables) 3.  Regular, moderate exercise can be beneficial. 4.  Avoid medications causing constipation, such as medications like antacids with calcium or magnesium 5.  Laxative overuse should be avoided. 6.  Stool softeners (Colace) can help with chronic constipation. 7.  Increase water intake.  You should be drinking 1/2 gallon of water a day as long as you have not been diagnosed with congestive heart failure or renal/kidney failure.  This is probably the single greatest thing that you can do to help your constipation.  

## 2017-05-24 ENCOUNTER — Telehealth: Payer: Self-pay | Admitting: Neurology

## 2017-05-24 NOTE — Telephone Encounter (Signed)
Jenny Reichmann called regarding the order sent for PT and for Speech. They said they are not providing her PT and wondered if this was sent in error? Please  Call. Thanks

## 2017-05-27 NOTE — Telephone Encounter (Signed)
Called patient and she states she thinks her current company is [place] and not [place].   Arville Go made aware.   ST order faxed to [facility] at [phone]

## 2017-05-29 ENCOUNTER — Telehealth: Payer: Self-pay | Admitting: Neurology

## 2017-05-29 NOTE — Telephone Encounter (Signed)
Tiffany with [facility] would like orders for PT for the patient.  [phone]

## 2017-05-29 NOTE — Telephone Encounter (Signed)
Spoke with Tiffany and made her aware patient is getting PT/ST through [facility].

## 2017-06-03 DIAGNOSIS — Z6841 Body Mass Index (BMI) 40.0 and over, adult: Secondary | ICD-10-CM | POA: Diagnosis not present

## 2017-06-03 DIAGNOSIS — I10 Essential (primary) hypertension: Secondary | ICD-10-CM | POA: Diagnosis not present

## 2017-06-03 DIAGNOSIS — Z0000 Encounter for general adult medical examination without abnormal findings: Secondary | ICD-10-CM | POA: Diagnosis not present

## 2017-06-03 DIAGNOSIS — G1220 Motor neuron disease, unspecified: Secondary | ICD-10-CM | POA: Diagnosis not present

## 2017-06-03 DIAGNOSIS — F339 Major depressive disorder, recurrent, unspecified: Secondary | ICD-10-CM | POA: Diagnosis not present

## 2017-06-14 DIAGNOSIS — R296 Repeated falls: Secondary | ICD-10-CM | POA: Diagnosis not present

## 2017-06-14 DIAGNOSIS — R278 Other lack of coordination: Secondary | ICD-10-CM | POA: Diagnosis not present

## 2017-06-14 DIAGNOSIS — F339 Major depressive disorder, recurrent, unspecified: Secondary | ICD-10-CM | POA: Diagnosis not present

## 2017-06-14 DIAGNOSIS — M6281 Muscle weakness (generalized): Secondary | ICD-10-CM | POA: Diagnosis not present

## 2017-06-14 DIAGNOSIS — E119 Type 2 diabetes mellitus without complications: Secondary | ICD-10-CM | POA: Diagnosis not present

## 2017-06-14 DIAGNOSIS — G903 Multi-system degeneration of the autonomic nervous system: Secondary | ICD-10-CM | POA: Diagnosis not present

## 2017-06-20 DIAGNOSIS — H5034 Intermittent alternating exotropia: Secondary | ICD-10-CM | POA: Diagnosis not present

## 2017-07-10 ENCOUNTER — Telehealth: Payer: Self-pay | Admitting: Neurology

## 2017-07-10 NOTE — Telephone Encounter (Signed)
Spoke with patient. She states Levodopa was working, but a couple weeks ago she has noticed hand tremors getting worse. Left worse than right. No recent illnesses/UTI. No recent med changes. She has had falls. No LOC. She did not hit head. States she has been using walker. They did call EMS due to fall last night, but EKG/blood sugar was fine. No other symptoms.  Please advise. She is currently on Carbidopa Levodopa 25/100 IR 2/2/1.

## 2017-07-10 NOTE — Telephone Encounter (Signed)
Increase to 2 po tid.  She shouldn't be walking at all (and will avoid falls that way)

## 2017-07-10 NOTE — Telephone Encounter (Signed)
[name] [name] called needing to see if she needs to still take her Carbidopa Levodopa medication? She said she is still shaking and feels that it is not working? Please Advise. Thanks

## 2017-07-11 DIAGNOSIS — T148XXA Other injury of unspecified body region, initial encounter: Secondary | ICD-10-CM | POA: Diagnosis not present

## 2017-07-11 DIAGNOSIS — M25569 Pain in unspecified knee: Secondary | ICD-10-CM | POA: Diagnosis not present

## 2017-07-11 NOTE — Telephone Encounter (Signed)
Spoke with patient and made her aware.  She lives at assisted living but dispenses her own medication. She will increase to 2 TID and call me when she needs refill of higher dosage.

## 2017-07-18 DIAGNOSIS — W19XXXA Unspecified fall, initial encounter: Secondary | ICD-10-CM | POA: Diagnosis not present

## 2017-07-18 DIAGNOSIS — Z23 Encounter for immunization: Secondary | ICD-10-CM | POA: Diagnosis not present

## 2017-07-18 DIAGNOSIS — S8990XA Unspecified injury of unspecified lower leg, initial encounter: Secondary | ICD-10-CM | POA: Diagnosis not present

## 2017-07-29 DIAGNOSIS — Z8781 Personal history of (healed) traumatic fracture: Secondary | ICD-10-CM | POA: Diagnosis not present

## 2017-07-29 DIAGNOSIS — R2689 Other abnormalities of gait and mobility: Secondary | ICD-10-CM | POA: Diagnosis not present

## 2017-07-29 DIAGNOSIS — R41841 Cognitive communication deficit: Secondary | ICD-10-CM | POA: Diagnosis not present

## 2017-07-29 DIAGNOSIS — I10 Essential (primary) hypertension: Secondary | ICD-10-CM | POA: Diagnosis not present

## 2017-07-29 DIAGNOSIS — M1990 Unspecified osteoarthritis, unspecified site: Secondary | ICD-10-CM | POA: Diagnosis not present

## 2017-07-29 DIAGNOSIS — M810 Age-related osteoporosis without current pathological fracture: Secondary | ICD-10-CM | POA: Diagnosis not present

## 2017-07-29 DIAGNOSIS — G20 Parkinson's disease: Secondary | ICD-10-CM | POA: Diagnosis not present

## 2017-07-29 DIAGNOSIS — E119 Type 2 diabetes mellitus without complications: Secondary | ICD-10-CM | POA: Diagnosis not present

## 2017-07-29 DIAGNOSIS — M6281 Muscle weakness (generalized): Secondary | ICD-10-CM | POA: Diagnosis not present

## 2017-07-30 DIAGNOSIS — M1711 Unilateral primary osteoarthritis, right knee: Secondary | ICD-10-CM | POA: Diagnosis not present

## 2017-07-30 DIAGNOSIS — E1140 Type 2 diabetes mellitus with diabetic neuropathy, unspecified: Secondary | ICD-10-CM | POA: Diagnosis not present

## 2017-07-30 DIAGNOSIS — Z6841 Body Mass Index (BMI) 40.0 and over, adult: Secondary | ICD-10-CM | POA: Diagnosis not present

## 2017-07-30 DIAGNOSIS — I10 Essential (primary) hypertension: Secondary | ICD-10-CM | POA: Diagnosis not present

## 2017-08-01 DIAGNOSIS — G1220 Motor neuron disease, unspecified: Secondary | ICD-10-CM | POA: Diagnosis not present

## 2017-08-01 DIAGNOSIS — G609 Hereditary and idiopathic neuropathy, unspecified: Secondary | ICD-10-CM | POA: Diagnosis not present

## 2017-08-01 DIAGNOSIS — I10 Essential (primary) hypertension: Secondary | ICD-10-CM | POA: Diagnosis not present

## 2017-08-02 DIAGNOSIS — M1990 Unspecified osteoarthritis, unspecified site: Secondary | ICD-10-CM | POA: Diagnosis not present

## 2017-08-02 DIAGNOSIS — E119 Type 2 diabetes mellitus without complications: Secondary | ICD-10-CM | POA: Diagnosis not present

## 2017-08-02 DIAGNOSIS — R41841 Cognitive communication deficit: Secondary | ICD-10-CM | POA: Diagnosis not present

## 2017-08-02 DIAGNOSIS — G20 Parkinson's disease: Secondary | ICD-10-CM | POA: Diagnosis not present

## 2017-08-05 DIAGNOSIS — Z8781 Personal history of (healed) traumatic fracture: Secondary | ICD-10-CM | POA: Diagnosis not present

## 2017-08-05 DIAGNOSIS — M1990 Unspecified osteoarthritis, unspecified site: Secondary | ICD-10-CM | POA: Diagnosis not present

## 2017-08-05 DIAGNOSIS — R2689 Other abnormalities of gait and mobility: Secondary | ICD-10-CM | POA: Diagnosis not present

## 2017-08-05 DIAGNOSIS — R41841 Cognitive communication deficit: Secondary | ICD-10-CM | POA: Diagnosis not present

## 2017-08-05 DIAGNOSIS — G20 Parkinson's disease: Secondary | ICD-10-CM | POA: Diagnosis not present

## 2017-08-05 DIAGNOSIS — I10 Essential (primary) hypertension: Secondary | ICD-10-CM | POA: Diagnosis not present

## 2017-08-05 DIAGNOSIS — M810 Age-related osteoporosis without current pathological fracture: Secondary | ICD-10-CM | POA: Diagnosis not present

## 2017-08-05 DIAGNOSIS — M6281 Muscle weakness (generalized): Secondary | ICD-10-CM | POA: Diagnosis not present

## 2017-08-05 DIAGNOSIS — E119 Type 2 diabetes mellitus without complications: Secondary | ICD-10-CM | POA: Diagnosis not present

## 2017-08-06 ENCOUNTER — Telehealth: Payer: Self-pay | Admitting: Neurology

## 2017-08-06 NOTE — Telephone Encounter (Signed)
Left message on machine for patient to call back.

## 2017-08-06 NOTE — Telephone Encounter (Signed)
Patient needs to talk to you about how she is feeling and her falling please call

## 2017-08-06 NOTE — Telephone Encounter (Signed)
Patient wanted to let us know that she fell twice last week. No LOC. She didn't hit her head.   She is not walking. Falls are occurring when she is transferring from the wheelchair to toilet. She is moving the motorized wheelchair to the bathroom and using a walker to transfer. Suggestions to help instead?

## 2017-08-06 NOTE — Telephone Encounter (Signed)
Is there someone with her when transferring?  Shouldn't be alone with these tasks.

## 2017-08-06 NOTE — Telephone Encounter (Signed)
Pt left a message she was returning your call

## 2017-08-07 DIAGNOSIS — R531 Weakness: Secondary | ICD-10-CM | POA: Diagnosis not present

## 2017-08-07 NOTE — Telephone Encounter (Signed)
LMOM making patient aware needs someone with her when transferring and to call with any questions.

## 2017-08-12 DIAGNOSIS — M810 Age-related osteoporosis without current pathological fracture: Secondary | ICD-10-CM | POA: Diagnosis not present

## 2017-08-12 DIAGNOSIS — E119 Type 2 diabetes mellitus without complications: Secondary | ICD-10-CM | POA: Diagnosis not present

## 2017-08-12 DIAGNOSIS — R41841 Cognitive communication deficit: Secondary | ICD-10-CM | POA: Diagnosis not present

## 2017-08-12 DIAGNOSIS — G20 Parkinson's disease: Secondary | ICD-10-CM | POA: Diagnosis not present

## 2017-08-12 DIAGNOSIS — M1990 Unspecified osteoarthritis, unspecified site: Secondary | ICD-10-CM | POA: Diagnosis not present

## 2017-08-15 DIAGNOSIS — E1140 Type 2 diabetes mellitus with diabetic neuropathy, unspecified: Secondary | ICD-10-CM | POA: Diagnosis not present

## 2017-08-15 DIAGNOSIS — I10 Essential (primary) hypertension: Secondary | ICD-10-CM | POA: Diagnosis not present

## 2017-08-15 DIAGNOSIS — W19XXXA Unspecified fall, initial encounter: Secondary | ICD-10-CM | POA: Diagnosis not present

## 2017-08-26 DIAGNOSIS — E119 Type 2 diabetes mellitus without complications: Secondary | ICD-10-CM | POA: Diagnosis not present

## 2017-08-26 DIAGNOSIS — I10 Essential (primary) hypertension: Secondary | ICD-10-CM | POA: Diagnosis not present

## 2017-09-02 ENCOUNTER — Telehealth: Payer: Self-pay | Admitting: Neurology

## 2017-09-02 DIAGNOSIS — I10 Essential (primary) hypertension: Secondary | ICD-10-CM | POA: Diagnosis not present

## 2017-09-02 DIAGNOSIS — M179 Osteoarthritis of knee, unspecified: Secondary | ICD-10-CM | POA: Diagnosis not present

## 2017-09-02 DIAGNOSIS — F320 Major depressive disorder, single episode, mild: Secondary | ICD-10-CM | POA: Diagnosis not present

## 2017-09-02 DIAGNOSIS — E1140 Type 2 diabetes mellitus with diabetic neuropathy, unspecified: Secondary | ICD-10-CM | POA: Diagnosis not present

## 2017-09-02 MED ORDER — CARBIDOPA-LEVODOPA 25-100 MG PO TABS: 2.0000 | ORAL_TABLET | Freq: Three times a day (TID) | ORAL | 1 refills | Status: DC

## 2017-09-02 NOTE — Telephone Encounter (Signed)
[name] [name] called needing to have a refill called in on her Carbidopa Levodopa medication. She had a new dosage called in and now needs a refill. Please Advise. Thanks

## 2017-09-02 NOTE — Telephone Encounter (Signed)
RX for increased dose of Carbidopa Levodopa 1 TID sent to pharmacy.

## 2017-09-03 DIAGNOSIS — G20 Parkinson's disease: Secondary | ICD-10-CM | POA: Diagnosis not present

## 2017-09-03 DIAGNOSIS — E119 Type 2 diabetes mellitus without complications: Secondary | ICD-10-CM | POA: Diagnosis not present

## 2017-09-03 DIAGNOSIS — M1990 Unspecified osteoarthritis, unspecified site: Secondary | ICD-10-CM | POA: Diagnosis not present

## 2017-09-03 DIAGNOSIS — R41841 Cognitive communication deficit: Secondary | ICD-10-CM | POA: Diagnosis not present

## 2017-09-03 DIAGNOSIS — M810 Age-related osteoporosis without current pathological fracture: Secondary | ICD-10-CM | POA: Diagnosis not present

## 2017-09-07 ENCOUNTER — Other Ambulatory Visit: Payer: Self-pay | Admitting: Neurology

## 2017-09-16 DIAGNOSIS — M810 Age-related osteoporosis without current pathological fracture: Secondary | ICD-10-CM | POA: Diagnosis not present

## 2017-09-16 DIAGNOSIS — E119 Type 2 diabetes mellitus without complications: Secondary | ICD-10-CM | POA: Diagnosis not present

## 2017-09-16 DIAGNOSIS — R41841 Cognitive communication deficit: Secondary | ICD-10-CM | POA: Diagnosis not present

## 2017-09-16 DIAGNOSIS — G20 Parkinson's disease: Secondary | ICD-10-CM | POA: Diagnosis not present

## 2017-09-16 DIAGNOSIS — M1990 Unspecified osteoarthritis, unspecified site: Secondary | ICD-10-CM | POA: Diagnosis not present

## 2017-09-23 NOTE — Progress Notes (Signed)
[name] [name] was seen today in the movement disorders clinic for neurologic consultation at the request of [name] Bien, MD.    The patient presents for a second opinion regarding falls and balance difficulties.  She has been seeing Dr. Leta [name] since January, 2015 and most recently saw him on 11/25/2015 and saw his nurse practitioner on 12/14/2015.  I have taken a detailed review of the records from [facility] neurology.  Her initial complaint in January, 2015 was of dizziness and balance change.  By November, 2015, the patient's balance change and falls had become such a problem that Dr. Leta [name] told the patient that he thought it was unsafe for her to live alone.  He noted that the patient was very short stepped in her gait and she was off balance and antalgic.  Over the course of time, she continued to have repetitive falls.  As of August, 2016 the patient began to complain of drooling.  In November, 2016, the patient began to complain of tremor when holding a cup, which she states has increased.  01/26/16 update:  The patient presents today for follow-up.  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  She has continued to have falls and fell on February 23 ended up in the emergency room.  They did not do a CT of the brain.  Fortunately, she did not lose consciousness with that fall.  She did fall 3 times the day after that but none since.  She has had some dizziness with the medication.  She did have labwork at our last visit.  Her zinc was normal.  Vitamin D was normal.  Celiac panel was normal.  RPR was negative.  Vitamin B12 was slightly low at 313 and I asked her to start a B12 supplement.  Folate was normal.  The patient wanted to follow-up here a little bit earlier.  States that the diplopia is getting worse.  She had an MRI of the cervical spine on 01/16/16 that I reviewed.  There is degenerative changes and significant NFS and  C5 on the L, bilaterally at C6 and 7.  03/13/16 update:  The patient presents today for follow-up.  She is accompanied by her in-home caregiver who supplements the history.  Her caregiver now comes a few hours to days a week.  I have reviewed records since last visit.  The patient saw Dr. Joya [name] on 02/06/2016 and his notes stated that there was no doubt that the patient had cervical stenosis but he was not sure how much the surgery would help.  He told the patient to follow back up with me and my recommendations.  I subsequently got a call from the patient's primary care physician and she asked me the same question and I stated that while the surgery may help her pain, it was not going to help her falls, shuffling, or diplopia and the anesthesia could actually set the [name] [name] back.  Last visit, I did increase her carbidopa/levodopa 25/100-2 tablets 3 times per day.  I offered to change her to the extended release to see if that would help the dizziness but she did not want to do that.  She eventually called me and stated that she was dizzy and stated that her primary care physician said that she may need discontinue the medication.  I asked her if she was sure that it was from the medication because dizziness was one of her  presenting complaints at onset in 2015.  I did tell her that she could hold the medication and see if the dizziness changed.  The patient states that she d/c the medication.  She states that she has fallen 4 times, 2 prior to the d/c of levodopa and 2 after.  She really noticed no change in the dizziness and in fact states that she is more dizzy now.  The patient did see Dr. Rexene [name] on April 20 in follow-up for her sleep apnea.  She states that she does not have to see Dr. Rexene [name] for another year and it was just recommended that she use her CPAP more and try melatonin.  06/15/16 update:  The patient presents today for follow-up.  This patient is accompanied in the office by her caregiver who supplements  the history.  She has a caregiver 2 days a week.   I have reviewed multiple records made available to me.  The patient had anterior cervical discectomy at the C4-C5, C5-C6, and C6-C7 levels on May 12.  She was discharged from the hospital on May 14.  She had no surgical complications.  She is on carbidopa/levodopa 25/100, 2 tablets in the morning, 2 in the afternoon and one in the evening.  She has had falls since surgery, the last of which was 7/26.  She was bent over to get dish washing detergent and fell on her knees.  She also fell on 7/5 and she tried to get up from the chair and her foot got caught.  She fell on 6/15 and she bruised her arm.  With the falls, the walker was present but she wasn't necessarily leaning on it at the time.  She is in [name] [name] right now 2-3 days a week.    10/16/16 update:  [name] [name] presents for follow up.  She is accompanied by her caregiver and sister in law who supplement the history.She has MSA-C.  She is on carbidopa/levodopa 25/100, 2/2/1.  Asked me about driving last visit and I was leery because of diplopia so told her that she needed an ophthalmology evaluation.  She did go on 09/11/16 and they sent me handwritten notes but driving doesn't appear to have been addressed. She is supposed to get new glasses today.  She has not been driving much lately because of recent surgery, but admits that she did recently get released by her surgeon to drive and took a "test drive."  The records that were made available to me were reviewed.  Fell in August and sustained a humerus fx.  At that time, I recommend she stop walking all together and remain in a wheelchair at all times.  This was told to patient and her son (on the phone).  She underwent ORIF of the fx and a reverse total shoulder on 07/20/2016.  She had 24 hour/day care until this week and it was cut back on this week.  She admits that she still has been walking.  Getting choked on green tea/liquids.  "I'm having more laughter than I  should."  12/10/16 update:  Patient presents today for mobility evaluation.  She is accompanied by her caregiver.  A representative from advanced home care is also present.  The patient remains on carbidopa/levodopa 25/100, 2/2/1.  She did have a swallow study on 10/26/2016 that demonstrated mild oropharyngeal dysphagia, but no change the dietary recommendations were made at this time.  Regular diet with thin liquids was recommended.  Last visit and several times since, I talked  to her about the fact that I do not want her living alone and that I want her using a wheelchair at all times.  She has not been doing that and actually walked into the office today.  She states that she fell on jan 20 while trying to clean up something on the floor.  Had EMS eval her that day.  No LOC and no change in MS since then, which her caregiver agrees with.  She is moving this weekend to [facility] in [facility].  Her meds will be distributed.  She will get bathing assist.   05/23/17 update:  Patient presents for follow-up, accompanied by her sister-in-law and brother who supplements the history.  She is on carbidopa/levodopa 25/100, 2 tablets in the morning, 2 in the afternoon and one in the evening.  We did a mobility evaluation last visit and talked about the importance of staying seated at all times.  Unfortunately, she has not done that.  She presented to the hospital after a fall on 02/15/2017.  She did not hit her head.  I did get a note from Dr. Valetta Close dated 01/28/2017.  She saw him for worsening diplopia.  He recommended prisms.  They didn't help and she is supposed to see neuroopthalmology.   She moved again from [place] and is no longer in assisted living but is in retired living.  She has already had rib fx in her new place.  She has sitters at night and in the AM.  She has assistance with bathing and dressing and morning meals.    09/24/17 update: Patient seen today in follow-up for her MSA.  She is accompanied by  her sister in law who supplements the history.  I increased her carbidopa/levodopa 25/100 since her last visit so that she is now taking 2 tablets 3 times per day.  She has called a few times since last visit because of falls.  She was told that she should not be walking at all.  She is now in a motorized wheelchair, but was having difficulty with transfers and falling during the transfers.  She was told that she should not be alone for transfers.  Fortunately, she has not had any serious injuries.  She just had a recent fall.  She still has no one to help her with daytime toileting.  She is having trouble with swallowing and more coughing with food.      ALLERGIES:   Allergies  Allergen Reactions  . Aleve [Naproxen] Hives, Shortness Of Breath and Rash    [name] [name] Avoids All Nsaids  . Zocor [Simvastatin] Other (See Comments)    "weird feeling all over body"    CURRENT MEDICATIONS:  Outpatient Encounter Medications as of 09/24/2017  Medication Sig  . aspirin 81 MG tablet Take 81 mg by mouth daily.  Marland Kitchen azelastine (ASTELIN) 0.1 % nasal spray Place 1 spray into both nostrils 2 (two) times daily.   . carbidopa-levodopa (SINEMET IR) 25-100 MG tablet TAKE 2 TABLETS BY MOUTH EVERY MORNING AND 2 TABS AT LUNCH AND 1 TABLET AT BEDTIME (Patient taking differently: 2 tablets TID)  . Cholecalciferol (VITAMIN D PO) Take 5,000 Units by mouth daily.  . cyanocobalamin 500 MCG tablet Take 500 mcg by mouth daily.  Marland Kitchen escitalopram (LEXAPRO) 10 MG tablet Take 15 mg at bedtime by mouth.   . fenofibrate 160 MG tablet Take 160 mg by mouth every morning.   . fluticasone (FLONASE) 50 MCG/ACT nasal spray Place 2 sprays into both  nostrils daily.   Marland Kitchen loratadine (CLARITIN) 10 MG tablet Take 10 mg by mouth every morning.  . Melatonin 5 MG CAPS Take 1 capsule by mouth at bedtime as needed. For sleep  . Menthol, Topical Analgesic, (BIOFREEZE) 4 % GEL Apply topically.  . metFORMIN (GLUCOPHAGE) 500 MG tablet Take 1,000 mg 2 (two)  times daily with a meal by mouth.   . montelukast (SINGULAIR) 10 MG tablet Take 10 mg by mouth at bedtime.  Marland Kitchen nystatin cream (MYCOSTATIN) Apply 1 application topically 2 (two) times daily as needed for dry skin.  Marland Kitchen olmesartan (BENICAR) 40 MG tablet Take 40 mg daily by mouth.  . polyethylene glycol (MIRALAX / GLYCOLAX) packet Take 17 g by mouth daily.  . progesterone (PROMETRIUM) 100 MG capsule Take 100 mg by mouth at bedtime.  Marland Kitchen Propylene Glycol (SYSTANE BALANCE OP) Place 2 drops into both eyes 2 (two) times daily as needed (dry eyes).   . [DISCONTINUED] docusate sodium (COLACE) 100 MG capsule Take 100 mg by mouth daily as needed for mild constipation.   . [DISCONTINUED] glucosamine-chondroitin 500-400 MG tablet Take 1 tablet by mouth 2 (two) times daily.  . [DISCONTINUED] losartan (COZAAR) 100 MG tablet Take 100 mg by mouth every morning.    No facility-administered encounter medications on file as of 09/24/2017.     PAST MEDICAL HISTORY:   Past Medical History:  Diagnosis Date  . Arthritis    fingers,right shoulder  . 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 Chronic lumbar radiculopathy    L5- S1  right leg  . Depression   . Dizziness   . Frequency of urination   . Hard of hearing   . History of kidney stones 05/12/15   surgery   . HTN (hypertension)   . Hyperlipidemia   . 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 Numbness and tingling in hands   . OSA (obstructive sleep apnea)    moderate OSA per study 11-22-2007--  refused CPAP but used oxygen for 6 months at night,  states due to wt loss stopped using oxygen  . Polyneuropathy, diabetic ([place])    WALKS W/ CANE FOR BALANCE  . Right ureteral stone   . Shortness of breath dyspnea   . SUI (stress urinary incontinence, female)   . Type 2 diabetes mellitus (HCC)    Type II - Diet controlled  . Urinary incontinence   . Wears glasses     PAST SURGICAL HISTORY:     Past Surgical History:  Procedure Laterality Date  . CARDIOVASCULAR STRESS TEST  09-30-2007     normal Adenosine study/  no ischemia /  normal LV function and wall motion , ef 82%  . COLONOSCOPY    . EXTRACORPOREAL SHOCK WAVE LITHOTRIPSY Right 08-18-2012  . SHOULDER ARTHROSCOPY Right 07/2016  . TRANSTHORACIC ECHOCARDIOGRAM  09-23-2007   pseudonormal LV filling pattern,  ef 65-70%/  trivial AR/  mild LAE  . TUBAL LIGATION  1985    SOCIAL HISTORY:   Social History   Socioeconomic History  . Marital status: Widowed    Spouse name: Not on file  . Number of children: 1  . Years of education: College  . Highest education level: Not on file  Social Needs  . Financial resource strain: Not on file  . Food insecurity - worry: Not on file  . Food insecurity - inability: Not on  file  . Transportation needs - medical: Not on file  . Transportation needs - non-medical: Not on file  Occupational History  . Occupation: Glass blower/designer    Employer: [employer]  Tobacco Use  . Smoking status: Never Smoker  . Smokeless tobacco: Never Used  Substance and Sexual Activity  . Alcohol use: Yes    Alcohol/week: 0.0 oz    Comment: 3- 4 times a year  . Drug use: No  . Sexual activity: Not on file  Other Topics Concern  . Not on file  Social History Narrative   Patient lives at home with her dog name "Hot Rod".   Caffeine Use: 2-3 cups of coffee a day     FAMILY HISTORY:   Family Status  Relation Name Status  . Father  Deceased at age 21       heart disease  . Mother  Deceased at age 19       stroke  . Sister  Alive       asthma  . Sister  Deceased       breast cancer  . Sister  Deceased       breast cancer  . Unknown  (Not Specified)  . Sister  (Not Specified)    ROS:  Has some jaw grinding.  A complete 10 system review of systems was obtained and was unremarkable apart from what is mentioned above.  PHYSICAL EXAMINATION:    VITALS:   Vitals:   09/24/17 1454  BP:  110/62  Pulse: 90  SpO2: 95%     GEN:  The patient appears stated age and is in NAD. HEENT:  Normocephalic.  Ecchymosis on the forehead bilaterally, right more than L.    The mucous membranes are very dry. The superficial temporal arteries are without ropiness or tenderness.  No fasciculations in tongue. CV:  RRR Lungs:  CTAB Neck/HEME:  There are no carotid bruits bilaterally.  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  Neurological examination:  Orientation: The patient is alert and oriented x3.  Cranial nerves: There is good facial symmetry with the exception of mild L ptosis.  She has trouble with upgaze and downgaze now.  She has difficulty with smooth pursuit.  She has square wave jerks.   She closes eye to be able to see without diplopia.  The visual fields are full to confrontational testing. The speech is fluent and just intermittently dysarthric.   Some trouble with the gutteral sounds.  Soft palate rises symmetrically and there is no tongue deviation. Hearing is intact to conversational tone. Sensation: Sensation is intact to light touch throughout Motor: Strength is 5/5 in the bilateral upper and lower extremities.   Shoulder shrug is equal and symmetric.  There is no pronator drift.    Movement examination: Tone: There is normal tone in the bilateral upper extremities.  The tone in the lower extremities is normal.  Abnormal movements: Rare tremor of the left thumb; no dyskinesia; there is abnormal posture of the head with right head tilt and the right ear approximates the right shoulder Coordination:  There is decremation with RAM's, seen with virtually all forms of RAMs on the right, especially toe taps.   Gait and Station: The patient was not ambulated today as she is in a wheelchair and I do not want her to be ambulating any longer  ASSESSMENT/PLAN:  1.  Atypical parkinsonism  -I thought that she had MSA but she has lost all vertical eye  movements now and dx may be  PSP.  Regardless, tx and prognosis are the same.    -Continue carbidopa/levodopa 25/100, 2 tablets 3 times per day.  -Reminded her again that she is not to be walking and needs assistance with all transfers.  Will see if we can help with these resources  -Had her meet my new social worker and discussed resources  -I talked to her about advanced directives and the importance of getting these.  We talked about advanced directives in detail.  If she does not have advanced directives (she thinks that she does) then I want her to get them done.  I told her we could assist with these.  If she does have advanced directives, I told her I would like to make sure that they are updated.  -I talked to her about getting an eye patch and rotating it between eyes because of diplopia. 2.  Cervical spinal stenosis and NFS.  -She is status post anterior cervical discectomy from the C4-C7 levels on 03/23/2016. 3.  Dysphagia and cough  -She did have a swallow study on 10/26/2016 that demonstrated mild oropharyngeal dysphagia, but no change the dietary recommendations were made.  Regular diet with thin liquids was recommended.  Will repeat this study.    -will send to pulmonary for eval as well.   4.  Mild B12 deficiency  -is on B12 supplement 1021mg daily 5.  Constipation  -given rancho recipe at previous visits and is managing this well for now 6.  Pseudobulbar laughter  -discussed neudexta and she wishes to hold on this for now. 7.  Follow up is anticipated in the next few months, sooner should new neurologic issues arise.  Much greater than 50% of this visit was spent in counseling and coordinating care.  Total face to face time:  30 min.  Didn't include time she met with my sEducation officer, museum

## 2017-09-24 ENCOUNTER — Encounter: Payer: Self-pay | Admitting: Neurology

## 2017-09-24 ENCOUNTER — Ambulatory Visit: Payer: PPO | Admitting: Neurology

## 2017-09-24 VITALS — BP 110/62 | HR 90

## 2017-09-24 DIAGNOSIS — G231 Progressive supranuclear ophthalmoplegia [Steele-Richardson-Olszewski]: Secondary | ICD-10-CM

## 2017-09-24 DIAGNOSIS — R059 Cough, unspecified: Secondary | ICD-10-CM

## 2017-09-24 DIAGNOSIS — G238 Other specified degenerative diseases of basal ganglia: Secondary | ICD-10-CM | POA: Diagnosis not present

## 2017-09-24 DIAGNOSIS — R1319 Other dysphagia: Secondary | ICD-10-CM | POA: Diagnosis not present

## 2017-09-24 DIAGNOSIS — R05 Cough: Secondary | ICD-10-CM | POA: Diagnosis not present

## 2017-09-24 NOTE — Patient Instructions (Signed)
1. We have sent a referral to [facility]. They will call you directly with an appointment. If you do not hear from them they can be reached at [phone].  2. Get an eye patch and rotate it between both eyes.   3. We have scheduled you at [facility] for your modified barium swallow on 10/09/17 at 11:00 am. Please arrive 15 minutes prior and go to 1st floor radiology. If you need to reschedule for any reason please call [phone].

## 2017-09-25 ENCOUNTER — Encounter: Payer: Self-pay | Admitting: Psychology

## 2017-09-25 ENCOUNTER — Telehealth: Payer: Self-pay | Admitting: Neurology

## 2017-09-25 ENCOUNTER — Other Ambulatory Visit (HOSPITAL_COMMUNITY): Payer: Self-pay | Admitting: Neurology

## 2017-09-25 DIAGNOSIS — R1310 Dysphagia, unspecified: Secondary | ICD-10-CM

## 2017-09-25 NOTE — Telephone Encounter (Signed)
[name] [name]- Is this something you were helping the patient with?

## 2017-09-25 NOTE — Progress Notes (Signed)
Advanced Directives- Patient thinks that she has completed advanced directives in the past. The plan is for her to locate them and contact me by the end of the week. I gave her a copy of the advanced directive forms from [facility] and explained them in the case that she needs/ wants to redo them. We discussed that once she locates them to review them and make sure that her wishes are still the same.  Living situation/Safety- I shared some resources from a [place] attorney that discusses more about long term care Medicaid and how to protect yourself from being penalized with paying for caregivers and spending down to qualify for Medicaid. The patient stated that she has an Ad[employer] account planner. The patient also stated that her pl;an was to spend down her resources until she needs to apply for LTC Medicaid.     We discussed her level of care needs as she is at high risk for falls. At this time, patient does not want to do more than independent level of care. I shared information on [place] and how that may help as her disease progresses she can move to a higher level of care. I will continue to work with her as she needs a higher level of care for safety.  She may be willing to privately pay for caregiving in her independent living apartment. She currently pays for someone to go into her apartment for a few hours at a time.   Atypical Parkinson's Group-I shared that as soon as this group is ready to meet, I will notify her.

## 2017-09-25 NOTE — Telephone Encounter (Signed)
Tc to [name] [name] and left a messaging reference to her needs of finding care from 7pm to 7am.  I am more than happy to make recommendations on private pay agencies for this type of care. In addition, I can provide information on higher levels of care and will also give her some information on [place], Corporate investment banker,  the [employer] special assistance fund if she is qualifies for this service. Funding can be different county to county in this program.

## 2017-09-25 NOTE — Telephone Encounter (Signed)
Patient needs to talk to someone about having help at night from 7 pm to 7am  Please call her

## 2017-09-27 ENCOUNTER — Telehealth: Payer: Self-pay | Admitting: Neurology

## 2017-09-27 DIAGNOSIS — R319 Hematuria, unspecified: Secondary | ICD-10-CM | POA: Diagnosis not present

## 2017-09-27 DIAGNOSIS — N390 Urinary tract infection, site not specified: Secondary | ICD-10-CM | POA: Diagnosis not present

## 2017-09-27 DIAGNOSIS — I10 Essential (primary) hypertension: Secondary | ICD-10-CM | POA: Diagnosis not present

## 2017-09-27 DIAGNOSIS — G1220 Motor neuron disease, unspecified: Secondary | ICD-10-CM | POA: Diagnosis not present

## 2017-09-27 NOTE — Telephone Encounter (Signed)
Patient made aware to contact her PCP.

## 2017-09-27 NOTE — Telephone Encounter (Signed)
Pt called and said she thinks she has a UTI and would like a call back

## 2017-10-01 ENCOUNTER — Telehealth: Payer: Self-pay | Admitting: Neurology

## 2017-10-01 NOTE — Telephone Encounter (Signed)
Patient lmom to have someone please call her. Thanks

## 2017-10-02 NOTE — Telephone Encounter (Signed)
Patient just needed to know where to go for her appointment on 11-28.  Instructions given.

## 2017-10-09 ENCOUNTER — Encounter: Payer: Self-pay | Admitting: Pulmonary Disease

## 2017-10-09 ENCOUNTER — Ambulatory Visit (HOSPITAL_COMMUNITY)
Admission: RE | Admit: 2017-10-09 | Discharge: 2017-10-09 | Disposition: A | Discharge status: Home / Self Care | Payer: PPO | Source: Ambulatory Visit | Attending: Neurology | Admitting: Neurology

## 2017-10-09 ENCOUNTER — Ambulatory Visit: Payer: PPO | Admitting: Pulmonary Disease

## 2017-10-09 ENCOUNTER — Ambulatory Visit (INDEPENDENT_AMBULATORY_CARE_PROVIDER_SITE_OTHER)
Admission: RE | Admit: 2017-10-09 | Discharge: 2017-10-09 | Disposition: A | Discharge status: Home / Self Care | Payer: PPO | Source: Ambulatory Visit | Attending: Pulmonary Disease | Admitting: Pulmonary Disease

## 2017-10-09 VITALS — BP 128/82 | HR 90 | Ht 61.0 in | Wt 232.4 lb

## 2017-10-09 DIAGNOSIS — R053 Chronic cough: Secondary | ICD-10-CM

## 2017-10-09 DIAGNOSIS — R1310 Dysphagia, unspecified: Secondary | ICD-10-CM | POA: Diagnosis not present

## 2017-10-09 DIAGNOSIS — R05 Cough: Secondary | ICD-10-CM

## 2017-10-09 DIAGNOSIS — R0602 Shortness of breath: Secondary | ICD-10-CM | POA: Diagnosis not present

## 2017-10-09 DIAGNOSIS — R1319 Other dysphagia: Secondary | ICD-10-CM | POA: Insufficient documentation

## 2017-10-09 NOTE — Progress Notes (Signed)
Past surgical history She  has a past surgical history that includes Tubal ligation (1985); Extracorporeal shock wave lithotripsy (Right, 08-18-2012); Cardiovascular stress test (09-30-2007  ); transthoracic echocardiogram (09-23-2007); Cystoscopy with retrograde pyelogram, ureteroscopy and stent placement (Right, 05/12/2015); Holmium laser application (Right, 02/08/5187); Colonoscopy; Anterior cervical decomp/discectomy fusion (N/A, 03/23/2016); ORIF humerus fracture (Right, 07/20/2016); and Shoulder arthroscopy (Right, 07/2016).  Family history Her family history includes Breast cancer in her sister; Heart Problems in her father; Hypertension in her unknown relative; Stroke in her mother and unknown relative.  Social history She  reports that  has never smoked. she has never used smokeless tobacco. She reports that she drinks alcohol. She reports that she does not use drugs.   Review of Systems  Constitutional: Negative for fever and unexpected weight change.  HENT: Positive for postnasal drip, sinus pressure, sneezing and trouble swallowing. Negative for congestion, dental problem, ear pain, nosebleeds, rhinorrhea and sore throat.   Eyes: Negative for redness and itching.  Respiratory: Positive for cough, shortness of breath and wheezing. Negative for chest tightness.   Cardiovascular: Positive for leg swelling. Negative for palpitations.  Gastrointestinal: Negative for nausea and vomiting.  Genitourinary: Negative for dysuria.  Musculoskeletal: Negative for joint swelling.  Skin: Negative for rash.  Allergic/Immunologic: Positive for environmental allergies. Negative for food allergies and immunocompromised state.  Neurological: Negative for headaches.  Hematological: Bruises/bleeds easily.  Psychiatric/Behavioral: Negative for dysphoric mood. The patient is nervous/anxious.    Allergies  Allergen Reactions  . Aleve [Naproxen] Hives, Shortness Of Breath and Rash    Pt Avoids All Nsaids  .  Zocor [Simvastatin] Other (See Comments)    "weird feeling all over body"    Current Outpatient Medications on File Prior to Visit  Medication Sig  . aspirin 81 MG tablet Take 81 mg by mouth daily.  Marland Kitchen azelastine (ASTELIN) 0.1 % nasal spray Place 1 spray into both nostrils 2 (two) times daily.   . carbidopa-levodopa (SINEMET IR) 25-100 MG tablet TAKE 2 TABLETS BY MOUTH EVERY MORNING AND 2 TABS AT LUNCH AND 1 TABLET AT BEDTIME (Patient taking differently: 2 tablets TID)  . Cholecalciferol (VITAMIN D PO) Take 5,000 Units by mouth daily.  . cyanocobalamin 500 MCG tablet Take 500 mcg by mouth daily.  Marland Kitchen escitalopram (LEXAPRO) 10 MG tablet Take 15 mg at bedtime by mouth.   . fenofibrate 160 MG tablet Take 160 mg by mouth every morning.   . fluticasone (FLONASE) 50 MCG/ACT nasal spray Place 2 sprays into both nostrils daily.   Marland Kitchen loratadine (CLARITIN) 10 MG tablet Take 10 mg by mouth every morning.  . Melatonin 5 MG CAPS Take 1 capsule by mouth at bedtime as needed. For sleep  . Menthol, Topical Analgesic, (BIOFREEZE) 4 % GEL Apply topically.  . metFORMIN (GLUCOPHAGE) 500 MG tablet Take 1,000 mg 2 (two) times daily with a meal by mouth.   . montelukast (SINGULAIR) 10 MG tablet Take 10 mg by mouth at bedtime.  Marland Kitchen nystatin cream (MYCOSTATIN) Apply 1 application topically 2 (two) times daily as needed for dry skin.  . polyethylene glycol (MIRALAX / GLYCOLAX) packet Take 17 g by mouth daily.  . progesterone (PROMETRIUM) 100 MG capsule Take 100 mg by mouth at bedtime.  Marland Kitchen Propylene Glycol (SYSTANE BALANCE OP) Place 2 drops into both eyes 2 (two) times daily as needed (dry eyes).    No current facility-administered medications on file prior to visit.     Chief Complaint  Patient presents with  . pulm  consult    Pt referred by Dr. Wells Guiles Tat MD.  Pt has been falling at least 1-2 per month for last year, and fell twice just this morning. Pt cough for over 5 months, has wheezing, SOB at rest and with  exertion.     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  Vital signs BP 128/82 (BP Location: Left Arm, Cuff Size: Normal)   Pulse 90   Ht 5\' 1"  (1.549 m)   Wt 232 lb 6.4 oz (105.4 kg)   SpO2 95%   BMI 43.91 kg/m   History of present illness [name] [name] is a 66 y.o. female with chronic cough.  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  She has noticed a cough that has persisted for the past several months.  It is a barking cough and seems to be worse when she eats.  She had a swallowing test recently that was relatively unremarkable.  She does not produce phlegm.  She is not having wheeze, chest pain, fever, or hemoptysis.  She has seasonal allergies.  She has been using astelin, flonase, claritin, and singulair.  These medications haven't improved her cough.  She denies symptoms of reflux.  No food allergies.  She has allergy to maproxen, but takes aspirin daily.  She denies skin rash.  No history of smoking.  No history of pneumonia or exposure to tuberculosis.  She has not had a chest xray or breathing tests done recently.  She is followed by Dr. [name] for sleep apnea and is on CPAP.  Physical exam  General - pleasant, sitting in wheelchair Eyes - pupils reactive, wears glasses ENT - no sinus tenderness, no oral exudate, no LAN, mildly garbled speech, MP  Cardiac - regular, no murmur Chest - no wheeze, rales Abd - soft,  non tender Ext - 1+ non pitting edema Skin - no rashes Neuro - random ataxic movements Psych - normal mood   CMP Latest Ref Rng & Units 07/21/2016 07/20/2016 03/15/2016  Glucose 65 - 99 mg/dL 138(H) 118(H) 122(H)  BUN 6 - 20 mg/dL 11 13 14   Creatinine 0.44 - 1.00 mg/dL 0.58 0.55 0.67  Sodium 135 - 145 mmol/L 142 140 141  Potassium 3.5 - 5.1 mmol/L 3.5 4.1 4.8  Chloride 101 - 111 mmol/L 108 106 104  CO2 22 - 32 mmol/L 27 25 26   Calcium 8.9 - 10.3 mg/dL 9.0 9.8 10.0  Total Protein - - - -  Total Bilirubin - - - -  Alkaline Phos - - - -  AST - - - -  ALT - - - -     CBC Latest Ref Rng & Units 07/21/2016 07/20/2016 03/15/2016  WBC 4.0 - 10.5 K/uL - 6.1 4.5  Hemoglobin 12.0 - 15.0 g/dL 11.7(L) 13.1 13.8  Hematocrit 36.0 - 46.0 % 37.4 41.4 43.7  Platelets 150 - 400 K/uL - 306 229    Discussion She has persistent cough that has gotten progressively worse.  She also has sleep disordered breathing.  No history of smoking.  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  Need to exclude other causes of chronic cough.  If further pulmonary evaluation is unrevealing, then cough might be related to her neurologic process.  If the later is the cause, then management will likely be symptomatic.   Plan  Will arrange for chest xray and pulmonary function test Further plan based on these test results Will need to have ongoing discussions about goals of care given the progressive nature of her neurologic disorder She is followed with [facility] for her sleep apnea   Patient Instructions  Chest xray today Will arrange for pulmonary function test  Follow up in 3 weeks with Dr. Halford Chessman or Nurse Practitioner    Chesley Mires, MD [place] Pager:  [phone] 10/09/2017, 3:07 PM

## 2017-10-09 NOTE — Progress Notes (Signed)
Modified Barium Swallow Progress Note  Patient Details  Name: [name] [name] MRN: [id number] Date of Birth: 07/10/1951  Today's Date: 10/09/2017  Modified Barium Swallow completed.  Full report located under Chart Review in the Imaging Section.  Brief recommendations include the following:  Clinical Impression  Pt has a mild pharyngeal dysphagia with swallow triggering at the pyriform sinuses with thin liquids, allowing for trace penetration during larger, consecutive straw sips. All penetrates clear upon completion of the swallow. No frank penetration or aspiration occurs, although pt does have frequent coughing during the study. Esophageal scan was completed when pt first started coughing, with what appeared to be barium remaining in her esophagus. Suspect that she may have more primary esophageal issues contributing to her symptoms. Recommend to continue with regular textures and thin liquids as tolerated. SLP provided a handout with esophageal precautions. No further SLP f/u is indicated, although she may benefit from an esophageal w/u and/or GI consult should symptoms persist.   Swallow Evaluation Recommendations   Recommended Consults: Consider GI evaluation;Consider esophageal assessment   SLP Diet Recommendations: Regular solids;Thin liquid   Liquid Administration via: Cup;Straw   Medication Administration: Whole meds with liquid   Supervision: Patient able to self feed   Compensations: Slow rate;Small sips/bites;Follow solids with liquid   Postural Changes: Remain semi-upright after after feeds/meals (Comment);Seated upright at 90 degrees   Oral Care Recommendations: Oral care BID        Germain Osgood 10/09/2017,3:54 PM   Germain Osgood, M.A. CCC-SLP [phone]

## 2017-10-09 NOTE — Progress Notes (Signed)
   Subjective:    Patient ID: [name] [name], female    DOB: [birth year], 66 y.o.   MRN: [id number]  HPI    Review of Systems  Constitutional: Negative for fever and unexpected weight change.  HENT: Positive for postnasal drip, sinus pressure, sneezing and trouble swallowing. Negative for congestion, dental problem, ear pain, nosebleeds, rhinorrhea and sore throat.   Eyes: Negative for redness and itching.  Respiratory: Positive for cough, shortness of breath and wheezing. Negative for chest tightness.   Cardiovascular: Positive for leg swelling. Negative for palpitations.  Gastrointestinal: Negative for nausea and vomiting.  Genitourinary: Negative for dysuria.  Musculoskeletal: Negative for joint swelling.  Skin: Negative for rash.  Allergic/Immunologic: Positive for environmental allergies. Negative for food allergies and immunocompromised state.  Neurological: Negative for headaches.  Hematological: Bruises/bleeds easily.  Psychiatric/Behavioral: Negative for dysphoric mood. The patient is nervous/anxious.        Objective:   Physical Exam        Assessment & Plan:

## 2017-10-09 NOTE — Patient Instructions (Signed)
Chest xray today Will arrange for pulmonary function test  Follow up in 3 weeks with Dr. Halford Chessman or Nurse Practitioner

## 2017-10-11 ENCOUNTER — Telehealth: Payer: Self-pay | Admitting: Pulmonary Disease

## 2017-10-11 ENCOUNTER — Telehealth: Payer: Self-pay | Admitting: Neurology

## 2017-10-11 NOTE — Telephone Encounter (Signed)
Patient made aware we do have access to that report. Also aware we can not give out results from another provider. She will call Pulmonology for results.

## 2017-10-11 NOTE — Telephone Encounter (Signed)
VS has not reviewed yet. Please review  Called pt to advise we would call her once reviewed.

## 2017-10-11 NOTE — Telephone Encounter (Signed)
Patient called regarding a chest X Ray that was taken on Wednesday by her Pulmonologist. Please Call. She was checking to see if Dr. Carles Collet knew the results. Please Call. Thanks

## 2017-10-12 NOTE — Telephone Encounter (Signed)
Dg Chest 2 View  Result Date: 10/10/2017 CLINICAL DATA:  Cough.  Shortness of breath. EXAM: CHEST  2 VIEW COMPARISON:  Thoracic spine series 2 01/30/2016 . FINDINGS: Mediastinum hilar structures normal. Lungs are clear. Heart size normal. No pleural effusion or pneumothorax. Prior cervicothoracic spine fusion. Prior right shoulder replacement. Stable mild lower thoracic upper lumbar compression fracture. IMPRESSION: No acute cardiopulmonary disease.  No focal infiltrate noted. Electronically Signed   By: Marcello Moores  Register   On: 10/10/2017 06:40     Please let her know her chest xray was normal.

## 2017-10-14 ENCOUNTER — Telehealth: Payer: Self-pay | Admitting: Neurology

## 2017-10-14 NOTE — Telephone Encounter (Signed)
Pt called and said urinary incontinence is back and wants to know if Dr Tat can give her something for that

## 2017-10-14 NOTE — Telephone Encounter (Signed)
Spoke with pt and informed of cxr results per Dr Halford Chessman.  Pt verbalized understanding.  Nothing further needed.

## 2017-10-15 DIAGNOSIS — B372 Candidiasis of skin and nail: Secondary | ICD-10-CM | POA: Diagnosis not present

## 2017-10-15 DIAGNOSIS — R32 Unspecified urinary incontinence: Secondary | ICD-10-CM | POA: Diagnosis not present

## 2017-10-15 DIAGNOSIS — Z01419 Encounter for gynecological examination (general) (routine) without abnormal findings: Secondary | ICD-10-CM | POA: Diagnosis not present

## 2017-10-15 NOTE — Telephone Encounter (Signed)
Patient made aware to call urology/PCP if having urinary issues. She expressed understanding.

## 2017-10-17 ENCOUNTER — Telehealth: Payer: Self-pay | Admitting: Neurology

## 2017-10-17 NOTE — Telephone Encounter (Signed)
Patient called to let Dr. Carles Collet know that she has fallen 3 times today and they are happening more frequently. She had to have EMS called to her house this morning. Please Call. Thanks

## 2017-10-17 NOTE — Telephone Encounter (Signed)
I have no other recommendations unfortunately as patient knows not to walk.  I do think that they were previously happening with transfers but she knows that she was to have someone with her any time that she gets up.  What is status of increasing care?

## 2017-10-17 NOTE — Telephone Encounter (Signed)
I know it has been reiterated to the patient several times that she needs someone with her 24/7 to prevent falls. I have told her as late as this week when I spoke with her. Not really sure what else to say. Please advise.

## 2017-10-17 NOTE — Telephone Encounter (Signed)
I reiterated again that she needs 24/7 care. She has not had anyone with her all day. She is looking into what she can afford. [name] [name]- were any resources discussed with her?

## 2017-10-18 ENCOUNTER — Telehealth: Payer: Self-pay | Admitting: Psychology

## 2017-10-18 NOTE — Telephone Encounter (Signed)
TC with patient she is willing to consider assisted living. She reports that her sister in law  would take her to look at facilities. I gave her number for [facility] and asked her to call me back after she talks to them.

## 2017-10-18 NOTE — Telephone Encounter (Signed)
Patient called back and needed the number again. I called her back and provided the number again. I will also put information in the mail for PACE, senior services, [facility], [facility] and Assisted Living facilities list.  I will send a identical packet to the patient's son so he will have access to these resources as well.

## 2017-10-28 DIAGNOSIS — I10 Essential (primary) hypertension: Secondary | ICD-10-CM | POA: Diagnosis not present

## 2017-10-28 DIAGNOSIS — N390 Urinary tract infection, site not specified: Secondary | ICD-10-CM | POA: Diagnosis not present

## 2017-10-28 DIAGNOSIS — F320 Major depressive disorder, single episode, mild: Secondary | ICD-10-CM | POA: Diagnosis not present

## 2017-10-28 DIAGNOSIS — Z6841 Body Mass Index (BMI) 40.0 and over, adult: Secondary | ICD-10-CM | POA: Diagnosis not present

## 2017-11-04 DIAGNOSIS — G20 Parkinson's disease: Secondary | ICD-10-CM | POA: Diagnosis not present

## 2017-11-04 DIAGNOSIS — R2689 Other abnormalities of gait and mobility: Secondary | ICD-10-CM | POA: Diagnosis not present

## 2017-11-04 DIAGNOSIS — E119 Type 2 diabetes mellitus without complications: Secondary | ICD-10-CM | POA: Diagnosis not present

## 2017-11-04 DIAGNOSIS — M6281 Muscle weakness (generalized): Secondary | ICD-10-CM | POA: Diagnosis not present

## 2017-11-04 DIAGNOSIS — M1990 Unspecified osteoarthritis, unspecified site: Secondary | ICD-10-CM | POA: Diagnosis not present

## 2017-11-14 DIAGNOSIS — M1990 Unspecified osteoarthritis, unspecified site: Secondary | ICD-10-CM | POA: Diagnosis not present

## 2017-11-14 DIAGNOSIS — M6281 Muscle weakness (generalized): Secondary | ICD-10-CM | POA: Diagnosis not present

## 2017-11-14 DIAGNOSIS — G20 Parkinson's disease: Secondary | ICD-10-CM | POA: Diagnosis not present

## 2017-11-14 DIAGNOSIS — E119 Type 2 diabetes mellitus without complications: Secondary | ICD-10-CM | POA: Diagnosis not present

## 2017-11-14 DIAGNOSIS — R2689 Other abnormalities of gait and mobility: Secondary | ICD-10-CM | POA: Diagnosis not present

## 2017-11-15 ENCOUNTER — Ambulatory Visit (INDEPENDENT_AMBULATORY_CARE_PROVIDER_SITE_OTHER): Payer: PPO | Admitting: Pulmonary Disease

## 2017-11-15 ENCOUNTER — Ambulatory Visit: Payer: PPO | Admitting: Adult Health

## 2017-11-15 DIAGNOSIS — R05 Cough: Secondary | ICD-10-CM | POA: Diagnosis not present

## 2017-11-15 DIAGNOSIS — M6281 Muscle weakness (generalized): Secondary | ICD-10-CM | POA: Diagnosis not present

## 2017-11-15 DIAGNOSIS — R053 Chronic cough: Secondary | ICD-10-CM

## 2017-11-15 LAB — PULMONARY FUNCTION TEST
DL/VA % pred: 127 %
DL/VA: 5.63 ml/min/mmHg/L
DLCO COR % PRED: 117 %
DLCO COR: 23.85 ml/min/mmHg
DLCO UNC % PRED: 122 %
DLCO UNC: 24.75 ml/min/mmHg
FEF 25-75 POST: 2.55 L/s
FEF 25-75 Pre: 2.36 L/sec
FEF2575-%Change-Post: 8 %
FEF2575-%Pred-Post: 133 %
FEF2575-%Pred-Pre: 123 %
FEV1-%CHANGE-POST: 4 %
FEV1-%PRED-POST: 113 %
FEV1-%Pred-Pre: 108 %
FEV1-POST: 2.38 L
FEV1-Pre: 2.28 L
FEV1FVC-%Change-Post: 3 %
FEV1FVC-%PRED-PRE: 106 %
FEV6-%Change-Post: 1 %
FEV6-%Pred-Post: 106 %
FEV6-%Pred-Pre: 104 %
FEV6-POST: 2.81 L
FEV6-Pre: 2.78 L
FEV6FVC-%Change-Post: 0 %
FEV6FVC-%PRED-POST: 104 %
FEV6FVC-%Pred-Pre: 103 %
FVC-%Change-Post: 0 %
FVC-%PRED-PRE: 101 %
FVC-%Pred-Post: 101 %
FVC-POST: 2.81 L
FVC-Pre: 2.79 L
POST FEV6/FVC RATIO: 100 %
PRE FEV6/FVC RATIO: 100 %
Post FEV1/FVC ratio: 84 %
Pre FEV1/FVC ratio: 82 %

## 2017-11-15 NOTE — Progress Notes (Signed)
PFT done today. 

## 2017-11-19 ENCOUNTER — Ambulatory Visit (INDEPENDENT_AMBULATORY_CARE_PROVIDER_SITE_OTHER): Payer: PPO | Admitting: Adult Health

## 2017-11-19 ENCOUNTER — Encounter: Payer: Self-pay | Admitting: Adult Health

## 2017-11-19 DIAGNOSIS — R05 Cough: Secondary | ICD-10-CM | POA: Diagnosis not present

## 2017-11-19 DIAGNOSIS — G4733 Obstructive sleep apnea (adult) (pediatric): Secondary | ICD-10-CM | POA: Diagnosis not present

## 2017-11-19 DIAGNOSIS — Z9989 Dependence on other enabling machines and devices: Secondary | ICD-10-CM

## 2017-11-19 DIAGNOSIS — R053 Chronic cough: Secondary | ICD-10-CM

## 2017-11-19 MED ORDER — BENZONATATE 200 MG PO CAPS: 200.0000 mg | ORAL_CAPSULE | Freq: Three times a day (TID) | ORAL | 1 refills | Status: DC | PRN

## 2017-11-19 NOTE — Assessment & Plan Note (Signed)
Cont on CPAP At bedtime  

## 2017-11-19 NOTE — Patient Instructions (Addendum)
Begin Delsym 2 tsp Twice daily  For cough As needed   Tessalon Three times a day  For cough As needed   Continue on Claritin daily .  Begin Zantac 150mg  in am and At bedtime  .  NO MINTS .  Sips of water to soothe throat , avoid throat clearing and coughing .  GERD diet and swallow precautions .  Follow up in 6 weeks with Dr. Halford Chessman  Or [place]  Please contact office for sooner follow up if symptoms do not improve or worsen or seek emergency care

## 2017-11-19 NOTE — Addendum Note (Signed)
Addended by: Parke Poisson E on: 11/19/2017 11:37 AM   Modules accepted: Orders

## 2017-11-19 NOTE — Progress Notes (Signed)
@Patient  ID: [name] [name], female    DOB: November 13, 1950, 67 y.o.   MRN: [id number]  Chief Complaint  Patient presents with  . Follow-up    Referring provider: [name], MD  HPI: 67 yo female seen for pulmonary consult 10/09/17 for chronic cough .  Followed by Dr. 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  Patient was seen November 28 for a pulmonary consult for chronic cough.  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  She has had an ongoing cough for the last several months.  Patient has not been using anything for her cough.  She does have some postnasal drainage.  Has noticed over the last couple weeks that she has had some intermittent reflux.  Patient was set up for a chest x-ray that showed clear lungs.  She is set up for a pulmonary function test that was normal that showed an FEV1 at 113%, ratio 84, FVC 101%.  DLCO 122%.  No significant bronchodilator response. Patient has had some intermittent dysphagia.  She had a modified barium swallow that showed mild aspiration risk.  She denies any hemoptysis chest pain orthopnea PND or increased leg swelling. Due to her neurological disorder patient has tendency towards muscle weakness and falls. She does use a walker and wheelchair.  She does have sleep apnea on CPAP at bedtime.  She is followed by neurology for this  Allergies  Allergen Reactions  . Aleve [Naproxen] Hives, Shortness Of Breath and Rash    Pt Avoids All Nsaids  . Zocor [Simvastatin] Other (See Comments)    "weird feeling all over body"    Immunization History  Administered Date(s) Administered  . Influenza Split 08/28/2017  . Pneumococcal Conjugate-13 08/23/2016    Past Medical History:  Diagnosis Date  . Arthritis    fingers,right shoulder  . 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 Chronic lumbar radiculopathy    L5-  S1  right leg  . Depression   . Dizziness   . Frequency of urination   . Hard of hearing   . History of kidney stones 05/12/15   surgery   . HTN (hypertension)   . Hyperlipidemia   . 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 Numbness and tingling in hands   . OSA (obstructive sleep apnea)    moderate OSA per study 11-22-2007--  refused CPAP but used oxygen for 6 months at night,  states due to wt loss stopped using oxygen  . Polyneuropathy, diabetic ([place])    WALKS W/ CANE FOR BALANCE  . Right ureteral stone   . Shortness of breath dyspnea   . SUI (stress urinary incontinence, female)   . Type 2 diabetes mellitus ([facility])    Type II - Diet controlled  . Urinary incontinence   . Wears glasses     Tobacco History: Social History   Tobacco Use  Smoking Status Never Smoker  Smokeless Tobacco Never Used   Counseling given: Not Answered   Outpatient Encounter Medications as of 11/19/2017  Medication Sig  . aspirin 81 MG tablet Take 81 mg by mouth daily.  Marland Kitchen azelastine (ASTELIN) 0.1 % nasal spray Place 1 spray into both nostrils 2 (two) times daily.   . benzonatate (TESSALON) 200 MG capsule Take 1 capsule (200 mg total) by mouth  3 (three) times daily as needed for cough.  . carbidopa-levodopa (SINEMET IR) 25-100 MG tablet TAKE 2 TABLETS BY MOUTH EVERY MORNING AND 2 TABS AT LUNCH AND 1 TABLET AT BEDTIME (Patient taking differently: 2 tablets TID)  . Cholecalciferol (VITAMIN D PO) Take 5,000 Units by mouth daily.  . cyanocobalamin 500 MCG tablet Take 500 mcg by mouth daily.  Marland Kitchen escitalopram (LEXAPRO) 10 MG tablet Take 15 mg at bedtime by mouth.   . fenofibrate 160 MG tablet Take 160 mg by mouth every morning.   . fluticasone (FLONASE) 50 MCG/ACT nasal spray Place 2 sprays into both nostrils daily.   Marland Kitchen loratadine (CLARITIN) 10 MG tablet Take 10 mg by mouth every morning.  . Melatonin 5 MG CAPS Take 1 capsule by mouth at bedtime as needed. For sleep  .  Menthol, Topical Analgesic, (BIOFREEZE) 4 % GEL Apply topically.  . metFORMIN (GLUCOPHAGE) 500 MG tablet Take 1,000 mg 2 (two) times daily with a meal by mouth.   . montelukast (SINGULAIR) 10 MG tablet Take 10 mg by mouth at bedtime.  Marland Kitchen nystatin cream (MYCOSTATIN) Apply 1 application topically 2 (two) times daily as needed for dry skin.  . polyethylene glycol (MIRALAX / GLYCOLAX) packet Take 17 g by mouth daily.  Marland Kitchen Propylene Glycol (SYSTANE BALANCE OP) Place 2 drops into both eyes 2 (two) times daily as needed (dry eyes).   . [DISCONTINUED] progesterone (PROMETRIUM) 100 MG capsule Take 100 mg by mouth at bedtime.   No facility-administered encounter medications on file as of 11/19/2017.      Review of Systems  Constitutional:   No  weight loss, night sweats,  Fevers, chills, + fatigue, or  lassitude.  HEENT:   No headaches,  Difficulty swallowing,  Tooth/dental problems, or  Sore throat,                No sneezing, itching, ear ache, + nasal congestion, post nasal drip,   CV:  No chest pain,  Orthopnea, PND, swelling in lower extremities, anasarca, dizziness, palpitations, syncope.   GI  Noabdominal pain, nausea, vomiting, diarrhea, change in bowel habits, loss of appetite, bloody stools.   Resp: No chest wall deformity  Skin: no rash or lesions.  GU: no dysuria, change in color of urine, no urgency or frequency.  No flank pain, no hematuria   MS:  No joint pain or swelling.  No decreased range of motion.  No back pain.    Physical Exam  BP 122/72 (BP Location: Left Arm, Cuff Size: Normal)   Pulse 78   Ht 5\' 1"  (1.549 m)   Wt 232 lb (105.2 kg)   SpO2 97%   BMI 43.84 kg/m   GEN: A/Ox3; pleasant , NAD, obese in wheelchair   HEENT:  [place]/AT,  EACs-clear, TMs-wnl, NOSE-clear, THROAT-clear, no lesions, no postnasal drip or exudate noted.   NECK:  Supple w/ fair ROM; no JVD; normal carotid impulses w/o bruits; no thyromegaly or nodules palpated; no lymphadenopathy.    RESP   Clear  P & A; w/o, wheezes/ rales/ or rhonchi. no accessory muscle use, no dullness to percussion  CARD:  RRR, no m/r/g, trace  peripheral edema, pulses intact, no cyanosis or clubbing.  GI:   Soft & nt; nml bowel sounds; no organomegaly or masses detected.   Musco: Warm bil, no deformities or joint swelling noted.   Neuro: alert, no focal deficits noted.    Skin: Warm, no lesions or rashes    Lab Results:  CBC  BNP No results found for: BNP  ProBNP  Imaging: No results found.   Assessment & Plan:   Chronic cough Upper airway cough syndrome questionable etiology with possible triggers of drainage and reflux.  Pulmonary function test is unremarkable with no airflow obstruction or restriction.  DLCO is normal.  Recent chest x-ray was clear.  We will treat her for Cough triggers and cough control.  Plan Patient Instructions  Begin Delsym 2 tsp Twice daily  For cough As needed   Tessalon Three times a day  For cough As needed   Continue on Claritin daily .  Begin Zantac 150mg  in am and At bedtime  .  NO MINTS .  Sips of water to soothe throat , avoid throat clearing and coughing .  GERD diet and swallow precautions .  Follow up in 6 weeks with [name] [name]  Or [name] [name]  Please contact office for sooner follow up if symptoms do not improve or worsen or seek emergency care        OSA on CPAP Cont on CPAP At bedtime       Rexene Edison, NP 11/19/2017

## 2017-11-19 NOTE — Progress Notes (Signed)
I have reviewed and agree with assessment/plan.  Chesley Mires, MD [facility] Pulmonary/Critical Care 11/19/2017, 12:53 PM Pager:  [phone]

## 2017-11-19 NOTE — Assessment & Plan Note (Signed)
Upper airway cough syndrome questionable etiology with possible triggers of drainage and reflux.  Pulmonary function test is unremarkable with no airflow obstruction or restriction.  DLCO is normal.  Recent chest x-ray was clear.  We will treat her for Cough triggers and cough control.  Plan Patient Instructions  Begin Delsym 2 tsp Twice daily  For cough As needed   Tessalon Three times a day  For cough As needed   Continue on Claritin daily .  Begin Zantac 150mg  in am and At bedtime  .  NO MINTS .  Sips of water to soothe throat , avoid throat clearing and coughing .  GERD diet and swallow precautions .  Follow up in 6 weeks with Dr. Halford Chessman  Or [place]  Please contact office for sooner follow up if symptoms do not improve or worsen or seek emergency care

## 2017-11-22 DIAGNOSIS — M546 Pain in thoracic spine: Secondary | ICD-10-CM | POA: Diagnosis not present

## 2017-11-22 DIAGNOSIS — M542 Cervicalgia: Secondary | ICD-10-CM | POA: Diagnosis not present

## 2017-11-22 DIAGNOSIS — S199XXA Unspecified injury of neck, initial encounter: Secondary | ICD-10-CM | POA: Diagnosis not present

## 2017-11-22 DIAGNOSIS — M549 Dorsalgia, unspecified: Secondary | ICD-10-CM | POA: Diagnosis not present

## 2017-11-22 DIAGNOSIS — W01198A Fall on same level from slipping, tripping and stumbling with subsequent striking against other object, initial encounter: Secondary | ICD-10-CM | POA: Diagnosis not present

## 2017-11-22 DIAGNOSIS — I10 Essential (primary) hypertension: Secondary | ICD-10-CM | POA: Diagnosis not present

## 2017-11-22 DIAGNOSIS — G4489 Other headache syndrome: Secondary | ICD-10-CM | POA: Diagnosis not present

## 2017-11-22 DIAGNOSIS — G20 Parkinson's disease: Secondary | ICD-10-CM | POA: Diagnosis not present

## 2017-11-22 DIAGNOSIS — S0990XA Unspecified injury of head, initial encounter: Secondary | ICD-10-CM | POA: Diagnosis not present

## 2017-11-22 DIAGNOSIS — R51 Headache: Secondary | ICD-10-CM | POA: Diagnosis not present

## 2017-11-22 DIAGNOSIS — G8911 Acute pain due to trauma: Secondary | ICD-10-CM | POA: Diagnosis not present

## 2017-11-24 DIAGNOSIS — G4733 Obstructive sleep apnea (adult) (pediatric): Secondary | ICD-10-CM | POA: Diagnosis not present

## 2017-11-28 ENCOUNTER — Other Ambulatory Visit: Payer: Self-pay | Admitting: *Deleted

## 2017-11-28 DIAGNOSIS — M542 Cervicalgia: Secondary | ICD-10-CM | POA: Diagnosis not present

## 2017-11-28 DIAGNOSIS — Z6841 Body Mass Index (BMI) 40.0 and over, adult: Secondary | ICD-10-CM | POA: Diagnosis not present

## 2017-11-28 DIAGNOSIS — G1220 Motor neuron disease, unspecified: Secondary | ICD-10-CM | POA: Diagnosis not present

## 2017-11-28 DIAGNOSIS — W19XXXD Unspecified fall, subsequent encounter: Secondary | ICD-10-CM | POA: Diagnosis not present

## 2017-11-28 NOTE — Patient Outreach (Signed)
[facility]) Care Management  11/28/2017  [name] [name] Jun 05, 1951 [id number]   Telephone Screen  Referral Date: 11/25/17 Referral Source: [employer] Referral Reason: Other Member needs a referral to a dentist in network. Insurance: H[employer]  Outreach attempt #1 to patient. No answer. RN CM left HIPAA compliant message along with contact info.    Plan: RN CM will contact patient within one week.   Lake Bells, RN, BSN, MHA/MSL, [place] Telephonic Care Manager Coordinator [facility] Direct Phone: [phone] Cell Phone: [phone] Toll Free: [phone] Fax: [phone]

## 2017-11-29 ENCOUNTER — Telehealth: Payer: Self-pay | Admitting: Pulmonary Disease

## 2017-11-29 MED ORDER — BENZONATATE 200 MG PO CAPS: 200.0000 mg | ORAL_CAPSULE | Freq: Three times a day (TID) | ORAL | 1 refills | Status: DC | PRN

## 2017-11-29 NOTE — Telephone Encounter (Signed)
Spoke with pt and advised rx sent to pharmacy. Nothing further is needed.   

## 2017-12-02 DIAGNOSIS — G20 Parkinson's disease: Secondary | ICD-10-CM | POA: Diagnosis not present

## 2017-12-02 DIAGNOSIS — E119 Type 2 diabetes mellitus without complications: Secondary | ICD-10-CM | POA: Diagnosis not present

## 2017-12-02 DIAGNOSIS — R2689 Other abnormalities of gait and mobility: Secondary | ICD-10-CM | POA: Diagnosis not present

## 2017-12-02 DIAGNOSIS — M1990 Unspecified osteoarthritis, unspecified site: Secondary | ICD-10-CM | POA: Diagnosis not present

## 2017-12-02 DIAGNOSIS — M6281 Muscle weakness (generalized): Secondary | ICD-10-CM | POA: Diagnosis not present

## 2017-12-05 ENCOUNTER — Other Ambulatory Visit: Payer: Self-pay | Admitting: *Deleted

## 2017-12-05 ENCOUNTER — Ambulatory Visit: Payer: Self-pay | Admitting: *Deleted

## 2017-12-05 NOTE — Patient Outreach (Addendum)
[facility]) Care Management  12/05/2017  DARLEEN MOFFITT 24-Jul-1951 [id number]  Telephone Screen  Referral Date: 11/25/17 Referral Source: [employer] Referral Reason: Other Member needs a referral to a dentist in network. Insurance: HTA  Outreach attempt #2 to patient. No answer. RN CM left HIPAA compliant message along with contact info.   Plan: RN CM will contact patient within one week.  Lake Bells, RN, BSN, MHA/MSL, [place] Telephonic Care Manager Coordinator [facility] Direct Phone: [phone] Cell Phone: [phone] Toll Free: [phone] Fax: [phone]

## 2017-12-09 ENCOUNTER — Encounter: Payer: Self-pay | Admitting: *Deleted

## 2017-12-09 ENCOUNTER — Other Ambulatory Visit: Payer: Self-pay | Admitting: *Deleted

## 2017-12-09 ENCOUNTER — Ambulatory Visit: Payer: Self-pay | Admitting: *Deleted

## 2017-12-09 DIAGNOSIS — I10 Essential (primary) hypertension: Secondary | ICD-10-CM | POA: Diagnosis not present

## 2017-12-09 DIAGNOSIS — E782 Mixed hyperlipidemia: Secondary | ICD-10-CM | POA: Diagnosis not present

## 2017-12-09 DIAGNOSIS — E119 Type 2 diabetes mellitus without complications: Secondary | ICD-10-CM | POA: Diagnosis not present

## 2017-12-09 NOTE — Patient Outreach (Signed)
[name]) Care Management  12/09/2017  [name] [name] November 23, 1950 [id number]  Telephone Screen  Referral Date:11/25/17 Referral Source:[employer] Referral Reason:Other Member needs a referral to a dentist in network. Insurance:HTA  Outreach attempt #3 to patient. No answer. RN CM left HIPAA compliant message along with contact info.  Plan: RN CM will send unsuccessful outreach letter to patient and will close case if no response from patient within 10 business days.  Lake Bells, RN, BSN, MHA/MSL, [place] Telephonic Care Manager Coordinator [facility] Direct Phone: [phone] Cell Phone: [phone] Toll Free: [phone] Fax: [phone]

## 2017-12-10 DIAGNOSIS — E119 Type 2 diabetes mellitus without complications: Secondary | ICD-10-CM | POA: Diagnosis not present

## 2017-12-10 DIAGNOSIS — M1990 Unspecified osteoarthritis, unspecified site: Secondary | ICD-10-CM | POA: Diagnosis not present

## 2017-12-10 DIAGNOSIS — R2689 Other abnormalities of gait and mobility: Secondary | ICD-10-CM | POA: Diagnosis not present

## 2017-12-10 DIAGNOSIS — G20 Parkinson's disease: Secondary | ICD-10-CM | POA: Diagnosis not present

## 2017-12-10 DIAGNOSIS — M6281 Muscle weakness (generalized): Secondary | ICD-10-CM | POA: Diagnosis not present

## 2017-12-13 ENCOUNTER — Ambulatory Visit
Admission: RE | Admit: 2017-12-13 | Discharge: 2017-12-13 | Disposition: A | Discharge status: Home / Self Care | Payer: PPO | Source: Ambulatory Visit | Attending: Family Medicine | Admitting: Family Medicine

## 2017-12-13 ENCOUNTER — Other Ambulatory Visit: Payer: Self-pay | Admitting: Family Medicine

## 2017-12-13 DIAGNOSIS — E1140 Type 2 diabetes mellitus with diabetic neuropathy, unspecified: Secondary | ICD-10-CM | POA: Diagnosis not present

## 2017-12-13 DIAGNOSIS — I10 Essential (primary) hypertension: Secondary | ICD-10-CM | POA: Diagnosis not present

## 2017-12-13 DIAGNOSIS — M170 Bilateral primary osteoarthritis of knee: Secondary | ICD-10-CM

## 2017-12-13 DIAGNOSIS — M179 Osteoarthritis of knee, unspecified: Secondary | ICD-10-CM | POA: Diagnosis not present

## 2017-12-13 DIAGNOSIS — M542 Cervicalgia: Secondary | ICD-10-CM | POA: Diagnosis not present

## 2017-12-13 DIAGNOSIS — E782 Mixed hyperlipidemia: Secondary | ICD-10-CM | POA: Diagnosis not present

## 2017-12-17 DIAGNOSIS — R2689 Other abnormalities of gait and mobility: Secondary | ICD-10-CM | POA: Diagnosis not present

## 2017-12-17 DIAGNOSIS — G20 Parkinson's disease: Secondary | ICD-10-CM | POA: Diagnosis not present

## 2017-12-17 DIAGNOSIS — E119 Type 2 diabetes mellitus without complications: Secondary | ICD-10-CM | POA: Diagnosis not present

## 2017-12-17 DIAGNOSIS — M1990 Unspecified osteoarthritis, unspecified site: Secondary | ICD-10-CM | POA: Diagnosis not present

## 2017-12-17 DIAGNOSIS — M6281 Muscle weakness (generalized): Secondary | ICD-10-CM | POA: Diagnosis not present

## 2017-12-19 ENCOUNTER — Other Ambulatory Visit: Payer: Self-pay | Admitting: Family Medicine

## 2017-12-19 ENCOUNTER — Ambulatory Visit
Admission: RE | Admit: 2017-12-19 | Discharge: 2017-12-19 | Disposition: A | Discharge status: Home / Self Care | Payer: PPO | Source: Ambulatory Visit | Attending: Family Medicine | Admitting: Family Medicine

## 2017-12-19 DIAGNOSIS — W19XXXA Unspecified fall, initial encounter: Secondary | ICD-10-CM

## 2017-12-19 DIAGNOSIS — S299XXA Unspecified injury of thorax, initial encounter: Secondary | ICD-10-CM | POA: Diagnosis not present

## 2017-12-19 DIAGNOSIS — B029 Zoster without complications: Secondary | ICD-10-CM | POA: Diagnosis not present

## 2017-12-19 DIAGNOSIS — S20211A Contusion of right front wall of thorax, initial encounter: Secondary | ICD-10-CM | POA: Diagnosis not present

## 2017-12-19 DIAGNOSIS — R0781 Pleurodynia: Secondary | ICD-10-CM | POA: Diagnosis not present

## 2017-12-23 ENCOUNTER — Ambulatory Visit: Payer: Self-pay | Admitting: *Deleted

## 2017-12-23 ENCOUNTER — Other Ambulatory Visit: Payer: Self-pay | Admitting: *Deleted

## 2017-12-23 NOTE — Patient Outreach (Signed)
[facility]) Care Management  12/20/2017  [name] [name] 12-Aug-1951 [id number]   Incoming call from patient and HIPAA verified with patient. Patient lives at [facility]. She reported having caregivers that assist with ADLs. She is dependent upon transportation to medical appointments. 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 She is confined to a wheelchair and uses a lift chair. She is active with [facility] ([facility]). Patient has a history of Diabetes. Her last BG reading was 140. She discussed wanting to exercise, however she is limited. She wants to change her eating habits. She explained how she's had several falls. She had a fall this week trying to transfer from a chair to her wheelchair. She said her caregivers were in the other room and they assisted her with getting up from the floor. Patient stated, she is being treated for shingles. [facility] services and benefits explained to patient and she agreed to services.  Plan: RN CM advised patient to contact RNCM for any needs or concerns. RN CM advised patient to alert MD for any changes in conditions.  RN CM will send [facility] SW referral for possible assistance with possible safety due to falls.   Lake Bells, RN, BSN, MHA/MSL, [place] Telephonic Care Manager Coordinator [facility] Direct Phone: [phone] Cell Phone: [phone] Toll Free: [phone] Fax: [phone]

## 2017-12-25 ENCOUNTER — Encounter: Payer: Self-pay | Admitting: *Deleted

## 2017-12-27 ENCOUNTER — Ambulatory Visit: Payer: Self-pay | Admitting: *Deleted

## 2017-12-27 DIAGNOSIS — B029 Zoster without complications: Secondary | ICD-10-CM | POA: Diagnosis not present

## 2017-12-27 DIAGNOSIS — M1711 Unilateral primary osteoarthritis, right knee: Secondary | ICD-10-CM | POA: Diagnosis not present

## 2017-12-27 DIAGNOSIS — Z6841 Body Mass Index (BMI) 40.0 and over, adult: Secondary | ICD-10-CM | POA: Diagnosis not present

## 2017-12-31 ENCOUNTER — Ambulatory Visit: Payer: PPO | Admitting: Pulmonary Disease

## 2017-12-31 ENCOUNTER — Encounter: Payer: Self-pay | Admitting: Pulmonary Disease

## 2017-12-31 VITALS — BP 136/86 | HR 99 | Ht 61.0 in | Wt 218.0 lb

## 2017-12-31 DIAGNOSIS — G20 Parkinson's disease: Secondary | ICD-10-CM | POA: Diagnosis not present

## 2017-12-31 DIAGNOSIS — R053 Chronic cough: Secondary | ICD-10-CM

## 2017-12-31 DIAGNOSIS — R05 Cough: Secondary | ICD-10-CM

## 2017-12-31 DIAGNOSIS — R2689 Other abnormalities of gait and mobility: Secondary | ICD-10-CM | POA: Diagnosis not present

## 2017-12-31 DIAGNOSIS — E119 Type 2 diabetes mellitus without complications: Secondary | ICD-10-CM | POA: Diagnosis not present

## 2017-12-31 DIAGNOSIS — M6281 Muscle weakness (generalized): Secondary | ICD-10-CM | POA: Diagnosis not present

## 2017-12-31 DIAGNOSIS — M1990 Unspecified osteoarthritis, unspecified site: Secondary | ICD-10-CM | POA: Diagnosis not present

## 2017-12-31 NOTE — Patient Instructions (Signed)
Can try just using claritin as needed.  If your symptoms are stable after two weeks, then you can try changing zantac to once per day.  If you are still doing okay after two weeks, then you can try changing zantac to as needed  Continue using flonase, astelin, and singulair  Follow up in 4 months

## 2017-12-31 NOTE — Progress Notes (Signed)
[facility]  Chief Complaint  Patient presents with  . Follow-up    Pt cough is better    Vital signs: BP 136/86 (BP Location: Left Arm, Cuff Size: Normal)   Pulse 99   Ht 5\' 1"  (1.549 m)   Wt 218 lb (98.9 kg)   SpO2 95%   BMI 41.19 kg/m   History of Present Illness: [name] [name] is a 67 y.o. female with chronic cough.  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  She was seen by Rexene Edison in January.  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  Her cough has improved.  She still gets intermittent cough, but not as bad as before.  She is not having sinus congestion, post nasal drip, phlegm, chest pain, or reflux.  She feels that treating her sinuses and her reflux both were key to improving her symptoms.  Her PFT from January was unremarkable.  She had a chest xray from November that didn't show any lung disease.  Xray from February 8 showed old rib fractures.   Physical Exam:  General - pleasant, sitting in wheelchair Eyes - pupils reactive, wears glasses ENT - no sinus tenderness, no oral exudate, no LAN, TM with mild cerumen build up right ear Cardiac - regular, no murmur Chest - no wheeze, rales Abd - soft, non tender Ext - no edema Skin - no rashes Neuro - normal strength Psych - normal mood   Assessment/Plan:  Upper airway cough syndrome. - will have he change claritin to prn - continue regular use of flonase and astelin  Laryngopharyngeal reflux. 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 - f/u with Dr. Carles Collet with neurology  Obstructive sleep apnea. - she is followed by Dr. [name] with [facility] for this  Cerumen build up Rt ear. - she can continue to use debrox - if this persists, advised her to f/u with PCP   Patient Instructions  Can try just using  claritin as needed.  If your symptoms are stable after two weeks, then you can try changing zantac to once per day.  If you are still doing okay after two weeks, then you can try changing zantac to as needed  Continue using flonase, astelin, and singulair  Follow up in 4 months    Chesley Mires, MD [place] 12/31/2017, 11:47 AM Pager:  [phone]  Flow Sheet  Pulmonary tests: PFT 11/15/17 >> FEV1 2.38 (113%), FEV1% 84, TLC 3.87 (84%), DLCO 122%  Sleep tests: PSG 10/12/15 >> AHI 33.5, SpO2 low 77%  Past Medical History: She  has a past medical history of Arthritis, Bulge of cervical disc without myelopathy, Chronic lumbar radiculopathy, Depression, Dizziness, Frequency of urination, Hard of hearing, History of kidney stones (05/12/15), HTN (hypertension), Hyperlipidemia, 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  Past Surgical History: She  has a past surgical history that includes Tubal ligation (1985); Extracorporeal shock wave lithotripsy (Right, 08-18-2012); Cardiovascular stress test (09-30-2007  ); transthoracic echocardiogram (09-23-2007); Cystoscopy with retrograde pyelogram, ureteroscopy and stent placement (Right, 05/12/2015); Holmium laser application (Right, 02/08/5187); Colonoscopy; Anterior cervical decomp/discectomy fusion (N/A,  03/23/2016); ORIF humerus fracture (Right, 07/20/2016); and Shoulder arthroscopy (Right, 07/2016).  Family History: Her family history includes Breast cancer in her sister; Heart Problems in her father; Hypertension in her unknown relative; Stroke in her mother and unknown relative.  Social History: She  reports that  has never smoked. she has never  used smokeless tobacco. She reports that she drinks alcohol. She reports that she does not use drugs.  Medications: Allergies as of 12/31/2017      Reactions   Aleve [naproxen] Hives, Shortness Of Breath, Rash   Pt Avoids All Nsaids   Zocor [simvastatin] Other (See Comments)   "weird feeling all over body"      Medication List        Accurate as of 12/31/17 11:47 AM. Always use your most recent med list.          aspirin 81 MG tablet Take 81 mg by mouth daily.   azelastine 0.1 % nasal spray Commonly known as:  ASTELIN Place 1 spray into both nostrils 2 (two) times daily.   benzonatate 200 MG capsule Commonly known as:  TESSALON Take 1 capsule (200 mg total) by mouth 3 (three) times daily as needed for cough.   BIOFREEZE 4 % Gel Generic drug:  Menthol (Topical Analgesic) Apply topically.   carbidopa-levodopa 25-100 MG tablet Commonly known as:  SINEMET IR TAKE 2 TABLETS BY MOUTH EVERY MORNING AND 2 TABS AT LUNCH AND 1 TABLET AT BEDTIME   cyanocobalamin 500 MCG tablet Take 500 mcg by mouth daily.   escitalopram 10 MG tablet Commonly known as:  LEXAPRO Take 15 mg at bedtime by mouth.   fenofibrate 160 MG tablet Take 160 mg by mouth every morning.   fluticasone 50 MCG/ACT nasal spray Commonly known as:  FLONASE Place 2 sprays into both nostrils daily.   loratadine 10 MG tablet Commonly known as:  CLARITIN Take 10 mg by mouth every morning.   Melatonin 5 MG Caps Take 1 capsule by mouth at bedtime as needed. For sleep   metFORMIN 500 MG tablet Commonly known as:  GLUCOPHAGE Take 1,000 mg 2 (two) times daily with a meal by mouth.   montelukast 10 MG tablet Commonly known as:  SINGULAIR Take 10 mg by mouth at bedtime.   nystatin cream Commonly known as:  MYCOSTATIN Apply 1 application topically 2 (two) times daily as needed for dry skin.   polyethylene glycol packet Commonly known as:  MIRALAX / GLYCOLAX Take 17 g by mouth daily.   SYSTANE BALANCE  OP Place 2 drops into both eyes 2 (two) times daily as needed (dry eyes).   VITAMIN D PO Take 5,000 Units by mouth daily.

## 2018-01-01 ENCOUNTER — Encounter: Payer: Self-pay | Admitting: Adult Health

## 2018-01-01 ENCOUNTER — Ambulatory Visit: Payer: PPO | Admitting: Adult Health

## 2018-01-01 VITALS — BP 140/82 | HR 97 | Ht 61.0 in | Wt 218.0 lb

## 2018-01-01 DIAGNOSIS — G4733 Obstructive sleep apnea (adult) (pediatric): Secondary | ICD-10-CM

## 2018-01-01 DIAGNOSIS — Z9989 Dependence on other enabling machines and devices: Secondary | ICD-10-CM | POA: Diagnosis not present

## 2018-01-01 NOTE — Patient Instructions (Signed)
Your Plan:  Continue using CPAP nightly Try using the CPAP every time you sleep If your symptoms worsen or you develop new symptoms please let us know.   Thank you for coming to see [place] at [facility]. I hope we have been able to provide you high quality care today.  You may receive a patient satisfaction survey over the next few weeks. We would appreciate your feedback and comments so that we may continue to improve ourselves and the health of our patients.

## 2018-01-01 NOTE — Progress Notes (Addendum)
PATIENT: [name] [name] DOB: 1950-12-27  REASON FOR VISIT: follow up HISTORY FROM: patient  HISTORY OF PRESENT ILLNESS: Today 01/01/18 [name] [name] is a 67 year old female with a history of obstructive sleep apnea on CPAP she returns today for follow-up.  Her download indicates that she used her machine 29 out of 30 days for compliance of 97%.  She did not use her machine greater than 4 hours any night.  On average she uses her machine 1 hour and 29 minutes. her residual AHI is 6.4 on 7 cm of water with EPR 3.  Her leak in the 95th percentile is 23.6 L/min.  The patient states that she does try to use the machine whenever she is sleeping.  She reports that there are some nights that she only gets between 2 and 5 hours of sleep.  She denies any new symptoms.  She denies any significant daytime sleepiness.  She does follow with Dr. Carles Collet for possible PSP.  She returns today for evaluation.  HISTORY 12/26/2016 :She reportsDoing fairly well, overall, she has had a good transition to her new assisted living facility, she has been there for about a week. She's not exercising very much, she is practically wheelchair bound. She had a fall about 2 weeks ago. She has not been driving. 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 She has a son in [place]. She does not always keep the CPAP on. She starts off using it but in the middle of the night or early morning hours she has to get up to use the bathroom, which is usually 1 to 3 times per average night. Then she goes and sits in her lift chair and sleeps there without the CPAP on. In the interim, she had right shoulder replacement on 07/20/2016 under Dr. Veverly Fells, after she had sustained a displaced right humerus fracture. She had neck surgery on 03/23/2016 under Dr. [name]. She had anterior cervical C4-5, C5-6 and C6-7 discectomy, decompression of spinal cord, foraminotomy, interbody fusion with graft and plate. She was recently seen by Dr. Carles Collet on  12/10/2016 for her parkinsonism, concern for MSA and I reviewed the note. I reviewed her CPAP compliance data from 11/25/2016 through 12/24/2016, which is a total of 30 days, during which time she used her CPAP 29 days with percent used days greater than 4 hours at 30% only, indicating suboptimal compliance with an average usage of only 3 hours and 28 minutes, residual AHI 4.2 per hour, leak on the high side with the 95th percentile at 30.7 L/m on a pressure of 7 cm with EPR of 3. Saw Dr. Veverly Fells yesterday, released from the right shoulder aspect. Saw Dr. Maryjean Ka for neck pain recently and had neck injection in Jan., but did not help very much.     REVIEW OF SYSTEMS: Out of a complete 14 system review of symptoms, the patient complains only of the following symptoms, and all other reviewed systems are negative.  See HPI  ALLERGIES: Allergies  Allergen Reactions  . Aleve [Naproxen] Hives, Shortness Of Breath and Rash    Pt Avoids All Nsaids  . Zocor [Simvastatin] Other (See Comments)    "weird feeling all over body"    HOME MEDICATIONS: Outpatient Medications Prior to Visit  Medication Sig Dispense Refill  . aspirin 81 MG tablet Take 81 mg by mouth daily.    Marland Kitchen azelastine (ASTELIN) 0.1 % nasal spray Place 1 spray into both nostrils daily.   11  .  benzonatate (TESSALON) 200 MG capsule Take 1 capsule (200 mg total) by mouth 3 (three) times daily as needed for cough. 30 capsule 1  . carbidopa-levodopa (SINEMET IR) 25-100 MG tablet TAKE 2 TABLETS BY MOUTH EVERY MORNING AND 2 TABS AT LUNCH AND 1 TABLET AT BEDTIME (Patient taking differently: 2 tablets TID) 450 tablet 0  . Cholecalciferol (VITAMIN D PO) Take 5,000 Units by mouth daily.    . cyanocobalamin 500 MCG tablet Take 500 mcg by mouth daily.    Marland Kitchen escitalopram (LEXAPRO) 10 MG tablet Take 15 mg at bedtime by mouth.   2  . fenofibrate 160 MG tablet Take 160 mg by mouth every morning.     . fluticasone (FLONASE) 50 MCG/ACT nasal spray Place 2  sprays into both nostrils daily.   11  . loratadine (CLARITIN) 10 MG tablet Take 10 mg by mouth. Taking every other day.    . Melatonin 5 MG CAPS Take 1 capsule by mouth at bedtime as needed. For sleep    . Menthol, Topical Analgesic, (BIOFREEZE) 4 % GEL Apply topically.    . metFORMIN (GLUCOPHAGE) 500 MG tablet Take 1,000 mg 2 (two) times daily with a meal by mouth.     . montelukast (SINGULAIR) 10 MG tablet Take 10 mg by mouth at bedtime.    Marland Kitchen nystatin cream (MYCOSTATIN) Apply 1 application topically 2 (two) times daily as needed for dry skin.    Marland Kitchen Propylene Glycol (SYSTANE BALANCE OP) Place 2 drops into both eyes 2 (two) times daily as needed (dry eyes).     . ranitidine (ZANTAC) 150 MG tablet Take 150 mg by mouth 2 (two) times daily.    . polyethylene glycol (MIRALAX / GLYCOLAX) packet Take 17 g by mouth daily.     No facility-administered medications prior to visit.     PAST MEDICAL HISTORY: Past Medical History:  Diagnosis Date  . Arthritis    fingers,right shoulder  . 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 Chronic lumbar radiculopathy    L5- S1  right leg  . Depression   . Dizziness   . Frequency of urination   . Hard of hearing   . History of kidney stones 05/12/15   surgery   . HTN (hypertension)   . Hyperlipidemia   . 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 Numbness and tingling in hands   . OSA (obstructive sleep apnea)    moderate OSA per study 11-22-2007--  refused CPAP but used oxygen for 6 months at night,  states due to wt loss stopped using oxygen  . Polyneuropathy, diabetic ([place])    WALKS W/ CANE FOR BALANCE  . Right ureteral stone   . Shortness of breath dyspnea   . SUI (stress urinary incontinence, female)   . Type 2 diabetes mellitus ([facility])    Type II - Diet controlled  . Urinary incontinence   . Wears glasses     PAST SURGICAL HISTORY: Past Surgical History:  Procedure Laterality Date    . ANTERIOR CERVICAL DECOMP/DISCECTOMY FUSION N/A 03/23/2016   Procedure: Cervical Four-Five, Cervical Five-Six, Cervical Six-Seven Anterior cervical decompression/diskectomy/fusion;  Surgeon: Leeroy Cha, MD;  Location: [place] [facility];  Service: Neurosurgery;  Laterality: N/A;  C4-5 C5-6 C6-7 Anterior cervical decompression/diskectomy/fusion  . CARDIOVASCULAR STRESS TEST  09-30-2007     normal Adenosine study/  no ischemia /  normal LV function  and wall motion , ef 82%  . COLONOSCOPY    . CYSTOSCOPY WITH RETROGRADE PYELOGRAM, URETEROSCOPY AND STENT PLACEMENT Right 05/12/2015   Procedure: CYSTOSCOPY WITH RETROGRADE PYELOGRAM, URETEROSCOPY,STONE EXTRACTION AND STENT PLACEMENT;  Surgeon: Franchot Gallo, MD;  Location: [facility];  Service: Urology;  Laterality: Right;  . EXTRACORPOREAL SHOCK WAVE LITHOTRIPSY Right 08-18-2012  . HOLMIUM LASER APPLICATION Right 0/86/5784   Procedure: HOLMIUM LASER APPLICATION;  Surgeon: Franchot Gallo, MD;  Location[facility];  Service: Urology;  Laterality: Right;  . ORIF HUMERUS FRACTURE Right 07/20/2016   Procedure: OPEN REDUCTION INTERNAL FIXATION (ORIF) PROXIMAL HUMERUS FRACTURE VS REVERSE TOTAL SHOULDER ARTHROPLASTY;  Surgeon: Netta Cedars, MD;  Location: [facility];  Service: Orthopedics;  Laterality: Right;  . SHOULDER ARTHROSCOPY Right 07/2016  . TRANSTHORACIC ECHOCARDIOGRAM  09-23-2007   pseudonormal LV filling pattern,  ef 65-70%/  trivial AR/  mild LAE  . TUBAL LIGATION  1985    FAMILY HISTORY: Family History  Problem Relation Age of Onset  . Heart Problems Father   . Stroke Mother   . Hypertension Unknown   . Stroke Unknown   . Breast cancer Sister     SOCIAL HISTORY: Social History   Socioeconomic History  . Marital status: Widowed    Spouse name: Not on file  . Number of children: 1  . Years of education: College  . Highest education level: Not on file  Social Needs  . Financial resource strain: Not on  file  . Food insecurity - worry: Not on file  . Food insecurity - inability: Not on file  . Transportation needs - medical: Not on file  . Transportation needs - non-medical: Not on file  Occupational History  . Occupation: Glass blower/designer    Employer: [employer]  Tobacco Use  . Smoking status: Never Smoker  . Smokeless tobacco: Never Used  Substance and Sexual Activity  . Alcohol use: Yes    Alcohol/week: 0.0 oz    Comment: 3- 4 times a year  . Drug use: No  . Sexual activity: Not on file  Other Topics Concern  . Not on file  Social History Narrative   Patient lives at home with her dog name "Hot Rod".   Caffeine Use: 2-3 cups of coffee a day       PHYSICAL EXAM  Vitals:   01/01/18 1316  BP: 140/82  Pulse: 97  Weight: 218 lb (98.9 kg)  Height: 5\' 1"  (1.549 m)   Body mass index is 41.19 kg/m.  Generalized: Well developed, in no acute distress   Neurological examination  Mentation: Alert oriented to time, place, history taking. Follows all commands speech and language fluent Cranial nerve II-XII: Pupils were equal round reactive to light.  No vertical eye movements. Facial sensation and strength were normal. Uvula tongue midline. Head turning and shoulder shrug  were normal and symmetric. Motor: The motor testing reveals 5 over 5 strength of all 4 extremities. Good symmetric motor tone is noted throughout.  Sensory: Sensory testing is intact to soft touch on all 4 extremities. No evidence of extinction is noted.  Gait and station: Patient is in a wheelchair.    DIAGNOSTIC DATA (LABS, IMAGING, TESTING) - I reviewed patient records, labs, notes, testing and imaging myself where available.  Lab Results  Component Value Date   WBC 6.1 07/20/2016   HGB 11.7 (L) 07/21/2016   HCT 37.4 07/21/2016   MCV 91.6 07/20/2016   PLT 306 07/20/2016  Component Value Date/Time   NA 142 07/21/2016 0306   K 3.5 07/21/2016 0306   CL 108 07/21/2016 0306   CO2 27  07/21/2016 0306   GLUCOSE 138 (H) 07/21/2016 0306   BUN 11 07/21/2016 0306   CREATININE 0.58 07/21/2016 0306   CALCIUM 9.0 07/21/2016 0306   PROT 7.5 09/22/2007 1716   ALBUMIN 3.9 09/22/2007 1716   AST 26 09/22/2007 1716   ALT 30 09/22/2007 1716   ALKPHOS 89 09/22/2007 1716   BILITOT 0.9 09/22/2007 1716   GFRNONAA >60 07/21/2016 0306   GFRAA >60 07/21/2016 0306   No results found for: CHOL, HDL, LDLCALC, LDLDIRECT, TRIG, CHOLHDL Lab Results  Component Value Date   HGBA1C 6.4 (H) 03/15/2016   Lab Results  Component Value Date   VITAMINB12 313 12/29/2015   Lab Results  Component Value Date   TSH 2.360 12/01/2013      ASSESSMENT AND PLAN 67 y.o. 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 here with:  1.  Obstructive sleep apnea on CPAP  Patient CPAP download shows suboptimal compliance.  The patient tries to use the machine nightly but does not use it greater than 4 hours at all.  She is advised that she should use the machine anytime she is sleeping this includes naps.  She voiced understanding.  She is advised that if her symptoms worsen or she develops new symptoms she should let us know.  She will follow-up in 1 year or sooner if needed.     Ward Givens, MSN, NP-C 01/01/2018, 1:39 PM [facility] [address], [place], [place] [address] [phone]  I reviewed the above note and documentation by the  Nurse Practitioner and agree with the history, physical exam, assessment and plan as outlined above. I was immediately available for face-to-face consultation. Star Age, MD, PhD [facility])

## 2018-01-02 DIAGNOSIS — M1711 Unilateral primary osteoarthritis, right knee: Secondary | ICD-10-CM | POA: Diagnosis not present

## 2018-01-06 DIAGNOSIS — H6591 Unspecified nonsuppurative otitis media, right ear: Secondary | ICD-10-CM | POA: Diagnosis not present

## 2018-01-06 DIAGNOSIS — H6121 Impacted cerumen, right ear: Secondary | ICD-10-CM | POA: Diagnosis not present

## 2018-01-06 DIAGNOSIS — S30810A Abrasion of lower back and pelvis, initial encounter: Secondary | ICD-10-CM | POA: Diagnosis not present

## 2018-01-07 DIAGNOSIS — E119 Type 2 diabetes mellitus without complications: Secondary | ICD-10-CM | POA: Diagnosis not present

## 2018-01-07 DIAGNOSIS — G20 Parkinson's disease: Secondary | ICD-10-CM | POA: Diagnosis not present

## 2018-01-07 DIAGNOSIS — R1310 Dysphagia, unspecified: Secondary | ICD-10-CM | POA: Diagnosis not present

## 2018-01-07 DIAGNOSIS — R471 Dysarthria and anarthria: Secondary | ICD-10-CM | POA: Diagnosis not present

## 2018-01-07 DIAGNOSIS — G903 Multi-system degeneration of the autonomic nervous system: Secondary | ICD-10-CM | POA: Diagnosis not present

## 2018-01-10 DIAGNOSIS — M1711 Unilateral primary osteoarthritis, right knee: Secondary | ICD-10-CM | POA: Diagnosis not present

## 2018-01-13 DIAGNOSIS — G20 Parkinson's disease: Secondary | ICD-10-CM | POA: Diagnosis not present

## 2018-01-13 DIAGNOSIS — R1310 Dysphagia, unspecified: Secondary | ICD-10-CM | POA: Diagnosis not present

## 2018-01-13 DIAGNOSIS — H9011 Conductive hearing loss, unilateral, right ear, with unrestricted hearing on the contralateral side: Secondary | ICD-10-CM | POA: Diagnosis not present

## 2018-01-13 DIAGNOSIS — R471 Dysarthria and anarthria: Secondary | ICD-10-CM | POA: Diagnosis not present

## 2018-01-13 DIAGNOSIS — H6501 Acute serous otitis media, right ear: Secondary | ICD-10-CM | POA: Diagnosis not present

## 2018-01-13 DIAGNOSIS — G903 Multi-system degeneration of the autonomic nervous system: Secondary | ICD-10-CM | POA: Diagnosis not present

## 2018-01-13 DIAGNOSIS — E119 Type 2 diabetes mellitus without complications: Secondary | ICD-10-CM | POA: Diagnosis not present

## 2018-01-17 DIAGNOSIS — M1711 Unilateral primary osteoarthritis, right knee: Secondary | ICD-10-CM | POA: Diagnosis not present

## 2018-01-23 DIAGNOSIS — R1310 Dysphagia, unspecified: Secondary | ICD-10-CM | POA: Diagnosis not present

## 2018-01-23 DIAGNOSIS — G903 Multi-system degeneration of the autonomic nervous system: Secondary | ICD-10-CM | POA: Diagnosis not present

## 2018-01-23 DIAGNOSIS — I10 Essential (primary) hypertension: Secondary | ICD-10-CM | POA: Diagnosis not present

## 2018-01-23 DIAGNOSIS — G20 Parkinson's disease: Secondary | ICD-10-CM | POA: Diagnosis not present

## 2018-01-23 DIAGNOSIS — R471 Dysarthria and anarthria: Secondary | ICD-10-CM | POA: Diagnosis not present

## 2018-01-23 DIAGNOSIS — E119 Type 2 diabetes mellitus without complications: Secondary | ICD-10-CM | POA: Diagnosis not present

## 2018-01-24 DIAGNOSIS — G609 Hereditary and idiopathic neuropathy, unspecified: Secondary | ICD-10-CM | POA: Diagnosis not present

## 2018-01-24 DIAGNOSIS — H6591 Unspecified nonsuppurative otitis media, right ear: Secondary | ICD-10-CM | POA: Diagnosis not present

## 2018-01-24 DIAGNOSIS — M1711 Unilateral primary osteoarthritis, right knee: Secondary | ICD-10-CM | POA: Diagnosis not present

## 2018-01-24 DIAGNOSIS — Z6841 Body Mass Index (BMI) 40.0 and over, adult: Secondary | ICD-10-CM | POA: Diagnosis not present

## 2018-01-24 DIAGNOSIS — G1220 Motor neuron disease, unspecified: Secondary | ICD-10-CM | POA: Diagnosis not present

## 2018-01-27 DIAGNOSIS — E119 Type 2 diabetes mellitus without complications: Secondary | ICD-10-CM | POA: Diagnosis not present

## 2018-01-27 DIAGNOSIS — R471 Dysarthria and anarthria: Secondary | ICD-10-CM | POA: Diagnosis not present

## 2018-01-27 DIAGNOSIS — G20 Parkinson's disease: Secondary | ICD-10-CM | POA: Diagnosis not present

## 2018-01-27 DIAGNOSIS — R1310 Dysphagia, unspecified: Secondary | ICD-10-CM | POA: Diagnosis not present

## 2018-01-27 DIAGNOSIS — G903 Multi-system degeneration of the autonomic nervous system: Secondary | ICD-10-CM | POA: Diagnosis not present

## 2018-02-03 DIAGNOSIS — M47812 Spondylosis without myelopathy or radiculopathy, cervical region: Secondary | ICD-10-CM | POA: Diagnosis not present

## 2018-02-03 DIAGNOSIS — I10 Essential (primary) hypertension: Secondary | ICD-10-CM | POA: Diagnosis not present

## 2018-02-03 DIAGNOSIS — M4802 Spinal stenosis, cervical region: Secondary | ICD-10-CM | POA: Diagnosis not present

## 2018-02-04 DIAGNOSIS — R1310 Dysphagia, unspecified: Secondary | ICD-10-CM | POA: Diagnosis not present

## 2018-02-04 DIAGNOSIS — E119 Type 2 diabetes mellitus without complications: Secondary | ICD-10-CM | POA: Diagnosis not present

## 2018-02-04 DIAGNOSIS — G903 Multi-system degeneration of the autonomic nervous system: Secondary | ICD-10-CM | POA: Diagnosis not present

## 2018-02-04 DIAGNOSIS — R471 Dysarthria and anarthria: Secondary | ICD-10-CM | POA: Diagnosis not present

## 2018-02-04 DIAGNOSIS — G20 Parkinson's disease: Secondary | ICD-10-CM | POA: Diagnosis not present

## 2018-02-05 DIAGNOSIS — H9011 Conductive hearing loss, unilateral, right ear, with unrestricted hearing on the contralateral side: Secondary | ICD-10-CM | POA: Diagnosis not present

## 2018-02-05 DIAGNOSIS — H6501 Acute serous otitis media, right ear: Secondary | ICD-10-CM | POA: Diagnosis not present

## 2018-02-06 ENCOUNTER — Other Ambulatory Visit: Payer: Self-pay | Admitting: Neurosurgery

## 2018-02-06 DIAGNOSIS — M47812 Spondylosis without myelopathy or radiculopathy, cervical region: Secondary | ICD-10-CM

## 2018-02-07 ENCOUNTER — Other Ambulatory Visit: Payer: Self-pay | Admitting: Neurology

## 2018-02-11 DIAGNOSIS — R1310 Dysphagia, unspecified: Secondary | ICD-10-CM | POA: Diagnosis not present

## 2018-02-11 DIAGNOSIS — R471 Dysarthria and anarthria: Secondary | ICD-10-CM | POA: Diagnosis not present

## 2018-02-11 DIAGNOSIS — G20 Parkinson's disease: Secondary | ICD-10-CM | POA: Diagnosis not present

## 2018-02-11 DIAGNOSIS — E119 Type 2 diabetes mellitus without complications: Secondary | ICD-10-CM | POA: Diagnosis not present

## 2018-02-11 DIAGNOSIS — G903 Multi-system degeneration of the autonomic nervous system: Secondary | ICD-10-CM | POA: Diagnosis not present

## 2018-02-17 NOTE — Progress Notes (Signed)
[name] [name] was seen today in the movement disorders clinic for neurologic consultation at the request of [name], MD.    The patient presents for a second opinion regarding falls and balance difficulties.  She has been seeing Dr. [name] since January, 2015 and most recently saw him on 11/25/2015 and saw his nurse practitioner on 12/14/2015.  I have taken a detailed review of the records from [facility] neurology.  Her initial complaint in January, 2015 was of dizziness and balance change.  By November, 2015, the patient's balance change and falls had become such a problem that Dr. [name] told the patient that he thought it was unsafe for her to live alone.  He noted that the patient was very short stepped in her gait and she was off balance and antalgic.  Over the course of time, she continued to have repetitive falls.  As of August, 2016 the patient began to complain of drooling.  In November, 2016, the patient began to complain of tremor when holding a cup, which she states has increased.  01/26/16 update:  The patient presents today for follow-up.  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  She has continued to have falls and fell on February 23 ended up in the emergency room.  They did not do a CT of the brain.  Fortunately, she did not lose consciousness with that fall.  She did fall 3 times the day after that but none since.  She has had some dizziness with the medication.  She did have labwork at our last visit.  Her zinc was normal.  Vitamin D was normal.  Celiac panel was normal.  RPR was negative.  Vitamin B12 was slightly low at 313 and I asked her to start a B12 supplement.  Folate was normal.  The patient wanted to follow-up here a little bit earlier.  States that the diplopia is getting worse.  She had an MRI of the cervical spine on 01/16/16 that I reviewed.  There is degenerative changes and significant NFS and  C5 on the L, bilaterally at C6 and 7.  03/13/16 update:  The patient presents today for follow-up.  She is accompanied by her in-home caregiver who supplements the history.  Her caregiver now comes a few hours to days a week.  I have reviewed records since last visit.  The patient saw Dr. [name] on 02/06/2016 and his notes stated that there was no doubt that the patient had cervical stenosis but he was not sure how much the surgery would help.  He told the patient to follow back up with me and my recommendations.  I subsequently got a call from the patient's primary care physician and she asked me the same question and I stated that while the surgery may help her pain, it was not going to help her falls, shuffling, or diplopia and the anesthesia could actually set the [place] back.  Last visit, I did increase her carbidopa/levodopa 25/100-2 tablets 3 times per day.  I offered to change her to the extended release to see if that would help the dizziness but she did not want to do that.  She eventually called me and stated that she was dizzy and stated that her primary care physician said that she may need discontinue the medication.  I asked her if she was sure that it was from the medication because dizziness was one of her  presenting complaints at onset in 2015.  I did tell her that she could hold the medication and see if the dizziness changed.  The patient states that she d/c the medication.  She states that she has fallen 4 times, 2 prior to the d/c of levodopa and 2 after.  She really noticed no change in the dizziness and in fact states that she is more dizzy now.  The patient did see Dr. [name] on April 20 in follow-up for her sleep apnea.  She states that she does not have to see Dr. [name] for another year and it was just recommended that she use her CPAP more and try melatonin.  06/15/16 update:  The patient presents today for follow-up.  This patient is accompanied in the office by her caregiver who supplements  the history.  She has a caregiver 2 days a week.   I have reviewed multiple records made available to me.  The patient had anterior cervical discectomy at the C4-C5, C5-C6, and C6-C7 levels on May 12.  She was discharged from the hospital on May 14.  She had no surgical complications.  She is on carbidopa/levodopa 25/100, 2 tablets in the morning, 2 in the afternoon and one in the evening.  She has had falls since surgery, the last of which was 7/26.  She was bent over to get dish washing detergent and fell on her knees.  She also fell on 7/5 and she tried to get up from the chair and her foot got caught.  She fell on 6/15 and she bruised her arm.  With the falls, the walker was present but she wasn't necessarily leaning on it at the time.  She is in PT right now 2-3 days a week.    10/16/16 update:  Pt presents for follow up.  She is accompanied by her caregiver and sister in law who supplement the history.She has MSA-C.  She is on carbidopa/levodopa 25/100, 2/2/1.  Asked me about driving last visit and I was leery because of diplopia so told her that she needed an ophthalmology evaluation.  She did go on 09/11/16 and they sent me handwritten notes but driving doesn't appear to have been addressed. She is supposed to get new glasses today.  She has not been driving much lately because of recent surgery, but admits that she did recently get released by her surgeon to drive and took a "test drive."  The records that were made available to me were reviewed.  Fell in August and sustained a humerus fx.  At that time, I recommend she stop walking all together and remain in a wheelchair at all times.  This was told to patient and her son (on the phone).  She underwent ORIF of the fx and a reverse total shoulder on 07/20/2016.  She had 24 hour/day care until this week and it was cut back on this week.  She admits that she still has been walking.  Getting choked on green tea/liquids.  "I'm having more laughter than I  should."  12/10/16 update:  Patient presents today for mobility evaluation.  She is accompanied by her caregiver.  A representative from advanced home care is also present.  The patient remains on carbidopa/levodopa 25/100, 2/2/1.  She did have a swallow study on 10/26/2016 that demonstrated mild oropharyngeal dysphagia, but no change the dietary recommendations were made at this time.  Regular diet with thin liquids was recommended.  Last visit and several times since, I talked  to her about the fact that I do not want her living alone and that I want her using a wheelchair at all times.  She has not been doing that and actually walked into the office today.  She states that she fell on jan 20 while trying to clean up something on the floor.  Had EMS eval her that day.  No LOC and no change in MS since then, which her caregiver agrees with.  She is moving this weekend to [facility] in [facility].  Her meds will be distributed.  She will get bathing assist.   05/23/17 update:  Patient presents for follow-up, accompanied by her sister-in-law and brother who supplements the history.  She is on carbidopa/levodopa 25/100, 2 tablets in the morning, 2 in the afternoon and one in the evening.  We did a mobility evaluation last visit and talked about the importance of staying seated at all times.  Unfortunately, she has not done that.  She presented to the hospital after a fall on 02/15/2017.  She did not hit her head.  I did get a note from Dr. Valetta Close dated 01/28/2017.  She saw him for worsening diplopia.  He recommended prisms.  They didn't help and she is supposed to see neuroopthalmology.   She moved again from [place] and is no longer in assisted living but is in retired living.  She has already had rib fx in her new place.  She has sitters at night and in the AM.  She has assistance with bathing and dressing and morning meals.    09/24/17 update: Patient seen today in follow-up for her MSA.  She is accompanied by  her sister in law who supplements the history.  I increased her carbidopa/levodopa 25/100 since her last visit so that she is now taking 2 tablets 3 times per day.  She has called a few times since last visit because of falls.  She was told that she should not be walking at all.  She is now in a motorized wheelchair, but was having difficulty with transfers and falling during the transfers.  She was told that she should not be alone for transfers.  Fortunately, she has not had any serious injuries.  She just had a recent fall.  She still has no one to help her with daytime toileting.  She is having trouble with swallowing and more coughing with food.    02/18/18 update: Patient is seen today in follow-up.  She is accompanied by her sister-in-law and brother who supplements the history.  She is on carbidopa/levodopa 25/100, 2 tablets 3 times per day.  She and her family have attended the new atypical support group.  Records have been reviewed since our last visit.  Modified barium swallow was completed on October 09, 2017 and demonstrated mild pharyngeal dysphasia.  Regular diet with thin liquids was recommended.  A GI evaluation was recommended for esophageal assessment.  She saw Dr. Halford Chessman on October 09, 2017.  Chest x-ray was ordered and that was negative.  She had pulmonary function testing in January, 2019 that was unremarkable.  She is on Flonase and Astelin and as needed Claritin.  She followed up with [facility] neurology on October 01, 2018 in regards to her sleep apnea.  CPAP download demonstrated suboptimal compliance.  She has had multiple falls since her last visit.  Reiterated many times the importance of not walking.  She has discussed with our social worker resources for higher level of care.  Having neck pain and was to have an MRI tomorrow and was considering surgery.  Her family asks about this today.  Having significant diplopia as well.  ALLERGIES:   Allergies  Allergen Reactions  . Aleve  [Naproxen] Hives, Shortness Of Breath and Rash    Pt Avoids All Nsaids  . Zocor [Simvastatin] Other (See Comments)    "weird feeling all over body"    CURRENT MEDICATIONS:  Outpatient Encounter Medications as of 02/18/2018  Medication Sig  . aspirin 81 MG tablet Take 81 mg by mouth daily.  Marland Kitchen azelastine (ASTELIN) 0.1 % nasal spray Place 1 spray into both nostrils daily.   . carbidopa-levodopa (SINEMET IR) 25-100 MG tablet 2 tablets TID  . Cholecalciferol (VITAMIN D PO) Take 5,000 Units by mouth daily.  . cyanocobalamin 500 MCG tablet Take 500 mcg by mouth daily.  Marland Kitchen escitalopram (LEXAPRO) 10 MG tablet Take 15 mg at bedtime by mouth.   . fenofibrate 160 MG tablet Take 160 mg by mouth every morning.   . fluticasone (FLONASE) 50 MCG/ACT nasal spray Place 2 sprays into both nostrils daily.   Marland Kitchen loratadine (CLARITIN) 10 MG tablet Take 10 mg by mouth. Taking every other day.  . Melatonin 5 MG CAPS Take 1 capsule by mouth at bedtime as needed. For sleep  . Menthol, Topical Analgesic, (BIOFREEZE) 4 % GEL Apply topically.  . metFORMIN (GLUCOPHAGE) 500 MG tablet Take 1,000 mg 2 (two) times daily with a meal by mouth.   . montelukast (SINGULAIR) 10 MG tablet Take 10 mg by mouth at bedtime.  Marland Kitchen nystatin cream (MYCOSTATIN) Apply 1 application topically 2 (two) times daily as needed for dry skin.  Marland Kitchen Propylene Glycol (SYSTANE BALANCE OP) Place 2 drops into both eyes 2 (two) times daily as needed (dry eyes).   . ranitidine (ZANTAC) 150 MG tablet Take 150 mg by mouth 2 (two) times daily.  . [DISCONTINUED] benzonatate (TESSALON) 200 MG capsule Take 1 capsule (200 mg total) by mouth 3 (three) times daily as needed for cough.   No facility-administered encounter medications on file as of 02/18/2018.     PAST MEDICAL HISTORY:   Past Medical History:  Diagnosis Date  . Arthritis    fingers,right shoulder  . 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 Chronic lumbar radiculopathy     L5- S1  right leg  . Depression   . Dizziness   . Frequency of urination   . Hard of hearing   . History of kidney stones 05/12/15   surgery   . HTN (hypertension)   . Hyperlipidemia   . 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 Numbness and tingling in hands   . OSA (obstructive sleep apnea)    moderate OSA per study 11-22-2007--  refused CPAP but used oxygen for 6 months at night,  states due to wt loss stopped using oxygen  . Polyneuropathy, diabetic ([place])    WALKS W/ CANE FOR BALANCE  . Right ureteral stone   . Shortness of breath dyspnea   . SUI (stress urinary incontinence, female)   . Type 2 diabetes mellitus (HCC)    Type II - Diet controlled  . Urinary incontinence   . Wears glasses     PAST SURGICAL HISTORY:   Past Surgical History:  Procedure Laterality Date  . ANTERIOR CERVICAL DECOMP/DISCECTOMY FUSION N/A 03/23/2016   Procedure: Cervical Four-Five, Cervical  Five-Six, Cervical Six-Seven Anterior cervical decompression/diskectomy/fusion;  Surgeon: Leeroy Cha, MD;  Location: [place] [facility];  Service: Neurosurgery;  Laterality: N/A;  C4-5 C5-6 C6-7 Anterior cervical decompression/diskectomy/fusion  . CARDIOVASCULAR STRESS TEST  09-30-2007     normal Adenosine study/  no ischemia /  normal LV function and wall motion , ef 82%  . COLONOSCOPY    . CYSTOSCOPY WITH RETROGRADE PYELOGRAM, URETEROSCOPY AND STENT PLACEMENT Right 05/12/2015   Procedure: CYSTOSCOPY WITH RETROGRADE PYELOGRAM, URETEROSCOPY,STONE EXTRACTION AND STENT PLACEMENT;  Surgeon: Franchot Gallo, MD;  Location: [facility];  Service: Urology;  Laterality: Right;  . EXTRACORPOREAL SHOCK WAVE LITHOTRIPSY Right 08-18-2012  . HOLMIUM LASER APPLICATION Right 8/33/8250   Procedure: HOLMIUM LASER APPLICATION;  Surgeon: Franchot Gallo, MD;  Location: [facility];  Service: Urology;  Laterality: Right;  . ORIF HUMERUS FRACTURE Right 07/20/2016   Procedure:  OPEN REDUCTION INTERNAL FIXATION (ORIF) PROXIMAL HUMERUS FRACTURE VS REVERSE TOTAL SHOULDER ARTHROPLASTY;  Surgeon: Netta Cedars, MD;  Location: [place];  Service: Orthopedics;  Laterality: Right;  . SHOULDER ARTHROSCOPY Right 07/2016  . TRANSTHORACIC ECHOCARDIOGRAM  09-23-2007   pseudonormal LV filling pattern,  ef 65-70%/  trivial AR/  mild LAE  . TUBAL LIGATION  1985    SOCIAL HISTORY:   Social History   Socioeconomic History  . Marital status: Widowed    Spouse name: Not on file  . Number of children: 1  . Years of education: College  . Highest education level: Not on file  Occupational History  . Occupation: Therapist, sports: [employer]  . Financial resource strain: Not on file  . Food insecurity:    Worry: Not on file    Inability: Not on file  . Transportation needs:    Medical: Not on file    Non-medical: Not on file  Tobacco Use  . Smoking status: Never Smoker  . Smokeless tobacco: Never Used  Substance and Sexual Activity  . Alcohol use: Yes    Alcohol/week: 0.0 oz    Comment: 3- 4 times a year  . Drug use: No  . Sexual activity: Not on file  Lifestyle  . Physical activity:    Days per week: Not on file    Minutes per session: Not on file  . Stress: Not on file  Relationships  . Social connections:    Talks on phone: Not on file    Gets together: Not on file    Attends religious service: Not on file    Active member of club or organization: Not on file    Attends meetings of clubs or organizations: Not on file    Relationship status: Not on file  . Intimate partner violence:    Fear of current or ex partner: Not on file    Emotionally abused: Not on file    Physically abused: Not on file    Forced sexual activity: Not on file  Other Topics Concern  . Not on file  Social History Narrative   Patient lives at home with her dog name "Hot Rod".   Caffeine Use: 2-3 cups of coffee a day     FAMILY HISTORY:   Family Status    Relation Name Status  . Father  Deceased at age 66       heart disease  . Mother  Deceased at age 7       stroke  . Sister  Alive  asthma  . Sister  Deceased       breast cancer  . Sister  Deceased       breast cancer  . Unknown  (Not Specified)  . Sister  (Not Specified)    ROS:  Review of Systems  Constitutional: Negative.   HENT: Negative.   Eyes: Positive for double vision.  Respiratory: Positive for cough.   Cardiovascular: Negative.   Gastrointestinal: Negative.   Genitourinary: Negative.   Musculoskeletal: Positive for falls and neck pain.  Neurological: Positive for tremors and speech change.  Endo/Heme/Allergies: Negative.   Psychiatric/Behavioral: Negative.     PHYSICAL EXAMINATION:    VITALS:   Vitals:   02/18/18 1502  BP: 120/74  Pulse: 96  SpO2: 94%     GEN:  The patient appears stated age and is in NAD. HEENT:  Normocephalic, atraumatic.   The mucous membranes are very dry. The superficial temporal arteries are without ropiness or tenderness.  No fasciculations in tongue. CV:  RRR Lungs:  CTAB Neck/HEME:  There are no carotid bruits bilaterally.  Patient with mild cervical dystonia.  Neurological examination:  Orientation: The patient is alert and oriented x3.  Cranial nerves: There is good facial symmetry with the exception of mild L ptosis.  She has complete upgaze and downgaze paresis.  She has difficulty with smooth pursuit.  She has square wave jerks.   She often times purposely closes the right eye to be able to see without diplopia.  The visual fields are full to confrontational testing. The speech is fluent and significantly dysarthric.   Some trouble with the gutteral sounds.  Soft palate rises symmetrically and there is no tongue deviation. Hearing is intact to conversational tone. Sensation: Sensation is intact to light touch throughout Motor: Strength is 5/5 in the bilateral upper and lower extremities.   Shoulder shrug is equal and  symmetric.  There is no pronator drift.    Movement examination: Tone: There is normal tone in the bilateral upper extremities.  The tone in the lower extremities is normal.  Abnormal movements: There is no tremor or dyskinesia today.  There is abnormal posture of the head with right head tilt and the right ear approximates the right shoulder Coordination:  There is decremation with RAM's, seen with virtually all forms of RAMs on the right, especially toe taps.   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  Discussed in detail this diagnosis with the patient and her family.  Her brother has not been to the recent visits.  Multiple questions were asked.  Patient literature was given.  -Talked about her current living situation.  I think she needs a higher level of care.  Discussed this in detail.  She met with our Education officer, museum as well in this regard.  -Continue carbidopa/levodopa 25/100, 2 tablets 3 times per day.  -Talked again in detail about the fact that I do not want her walking.  Talked about the risks of doing so.  Talked about morbidity and mortality associated with PSP, particularly those associated with falls.  Also discussed risks of aspiration.  -Had her meet my new social worker and discussed resources  -I talked to her about getting an eye patch and rotating it between eyes because of diplopia.  We discussed this last visit  but she forgot.  Rather states that he is going to look into this. 2.  Cervical spinal stenosis and NFS.  -She is status post anterior cervical discectomy from the C4-C7 levels on 03/23/2016.    -she is having more neck pain now.  Recommended OTC lidocaine patches.  Will send to Dr. Letta Pate.  I think that some/most of pain due to cervical dystonia.  I reminded the patient about her surgery  in May, 2017 in which she wanted to have surgery and I thought that it would not help her symptoms, which it did not.  I also am worried about anesthesia in her.  Her disease has significantly progressed over the last few years.  Patient was agreeable to PM&R referral. 3.  Dysphagia and cough  -Modified barium swallow was completed on October 09, 2017 and demonstrated mild pharyngeal dysphasia.  Regular diet with thin liquids was recommended.  A GI evaluation was recommended for esophageal assessment.  We will refer.  -Patient is following with Dr. Halford Chessman for her cough.  She continues to have this. 4.  Mild B12 deficiency  -is on B12 supplement 1074mg daily 5.  Constipation  -given rancho recipe at previous visits and is managing this well for now 6.  Pseudobulbar laughter  -discussed neudexta and she wishes to hold on this for now. 7.  Obstructive sleep apnea syndrome  [facility]  Understands morbidity and mortality associated with untreated sleep apnea.  Last CPAP download with suboptimal compliance. 8.  Follow up is anticipated in the next few months, sooner should new neurologic issues arise.  Much greater than 50% of this visit was spent in counseling and coordinating care.  Total face to face time:  50 min

## 2018-02-18 ENCOUNTER — Encounter: Payer: Self-pay | Admitting: Psychology

## 2018-02-18 ENCOUNTER — Ambulatory Visit: Payer: PPO | Admitting: Neurology

## 2018-02-18 ENCOUNTER — Encounter: Payer: Self-pay | Admitting: Neurology

## 2018-02-18 VITALS — BP 120/74 | HR 96

## 2018-02-18 DIAGNOSIS — G231 Progressive supranuclear ophthalmoplegia [Steele-Richardson-Olszewski]: Secondary | ICD-10-CM | POA: Diagnosis not present

## 2018-02-18 DIAGNOSIS — R1310 Dysphagia, unspecified: Secondary | ICD-10-CM

## 2018-02-18 DIAGNOSIS — H532 Diplopia: Secondary | ICD-10-CM

## 2018-02-18 DIAGNOSIS — M542 Cervicalgia: Secondary | ICD-10-CM | POA: Diagnosis not present

## 2018-02-18 DIAGNOSIS — G243 Spasmodic torticollis: Secondary | ICD-10-CM | POA: Diagnosis not present

## 2018-02-18 DIAGNOSIS — R1319 Other dysphagia: Secondary | ICD-10-CM

## 2018-02-18 NOTE — Patient Instructions (Signed)
1. We have sent a referral to gastroenterology for your swallowing. They will call you directly with an appointment. Please call them at [phone] if you do not hear from them.   2. We have also sent a referral to Dr. Letta Pate at [facility] for your neck pain. They will call you with an appointment. It can take up to 6 weeks to hear from their office. If you do not hear from them please call them directly at [phone].

## 2018-02-18 NOTE — Progress Notes (Signed)
I met with the patient, her sister in law and the brother while they were in the clinic today.  We talked about the patient's needs and long term care goal planning.  I provided education on assisted living and how it can help with ADLs.  At this time the patient is an independent living facility and they are supplementing care by having a person provide care.  She is not 24-hour care at this time.  She is at risk for falling if she needs to go to the bathroom and her paid caregiver is not there. The patient's concern is that she would lose some autonomy if she moves to assisted living.  We talked about how she would be able to do much more with appropriate care.  We talked about the possibility of going to a [place].  The reason why is because the patient is more appropriate for assisted living needs level and more than likely her disease will be compatible with needing skilled care in the future.  She shared that her concern was that she would lose autonomy with skilled care.  We talked about the disease taking away function  more than losing autonomy because her level of care.  We also talked about that if she is receiving skilled care in the future that hospice/palliative care could be involved which would focus on quality of life at end-of-life.  The patient has family that is involved and desire to remain involved known daughter the level of care that the patient would be.  They are  interested in the [place] so options are [facility], [facility] and [facility].  Their homework is to look into those 3 places.  I will see them next week to be support group and the plan is for them to follow up with me about the research that they have done.  They are aware that if these 3 facilities are too expensive that we can talk about separate assisted living facilities that would be good options for the patient.  The goal is for her to have as much independence as possible, a good quality of life, safety, and  appropriate care that meets the demands and needs presented by her disease.

## 2018-02-19 ENCOUNTER — Other Ambulatory Visit: Payer: PPO

## 2018-03-10 ENCOUNTER — Encounter: Payer: Self-pay | Admitting: Gastroenterology

## 2018-03-18 ENCOUNTER — Encounter: Payer: Self-pay | Admitting: Physical Medicine & Rehabilitation

## 2018-03-18 DIAGNOSIS — E119 Type 2 diabetes mellitus without complications: Secondary | ICD-10-CM | POA: Diagnosis not present

## 2018-03-18 DIAGNOSIS — E785 Hyperlipidemia, unspecified: Secondary | ICD-10-CM | POA: Diagnosis not present

## 2018-03-18 DIAGNOSIS — E1140 Type 2 diabetes mellitus with diabetic neuropathy, unspecified: Secondary | ICD-10-CM | POA: Diagnosis not present

## 2018-03-18 DIAGNOSIS — Z79899 Other long term (current) drug therapy: Secondary | ICD-10-CM | POA: Diagnosis not present

## 2018-03-18 DIAGNOSIS — I10 Essential (primary) hypertension: Secondary | ICD-10-CM | POA: Diagnosis not present

## 2018-03-20 DIAGNOSIS — J309 Allergic rhinitis, unspecified: Secondary | ICD-10-CM | POA: Diagnosis not present

## 2018-03-20 DIAGNOSIS — E1140 Type 2 diabetes mellitus with diabetic neuropathy, unspecified: Secondary | ICD-10-CM | POA: Diagnosis not present

## 2018-03-20 DIAGNOSIS — I10 Essential (primary) hypertension: Secondary | ICD-10-CM | POA: Diagnosis not present

## 2018-03-20 DIAGNOSIS — G1220 Motor neuron disease, unspecified: Secondary | ICD-10-CM | POA: Diagnosis not present

## 2018-03-24 ENCOUNTER — Encounter: Payer: Self-pay | Admitting: Physical Medicine & Rehabilitation

## 2018-03-24 ENCOUNTER — Ambulatory Visit (HOSPITAL_BASED_OUTPATIENT_CLINIC_OR_DEPARTMENT_OTHER): Payer: PPO | Admitting: Physical Medicine & Rehabilitation

## 2018-03-24 ENCOUNTER — Encounter: Payer: PPO | Attending: Physical Medicine & Rehabilitation

## 2018-03-24 VITALS — BP 146/83 | HR 85 | Resp 14 | Ht 61.5 in | Wt 218.0 lb

## 2018-03-24 DIAGNOSIS — G231 Progressive supranuclear ophthalmoplegia [Steele-Richardson-Olszewski]: Secondary | ICD-10-CM | POA: Diagnosis not present

## 2018-03-24 DIAGNOSIS — E119 Type 2 diabetes mellitus without complications: Secondary | ICD-10-CM | POA: Insufficient documentation

## 2018-03-24 DIAGNOSIS — I10 Essential (primary) hypertension: Secondary | ICD-10-CM | POA: Diagnosis not present

## 2018-03-24 DIAGNOSIS — Z8249 Family history of ischemic heart disease and other diseases of the circulatory system: Secondary | ICD-10-CM | POA: Insufficient documentation

## 2018-03-24 DIAGNOSIS — G243 Spasmodic torticollis: Secondary | ICD-10-CM | POA: Insufficient documentation

## 2018-03-24 DIAGNOSIS — Z823 Family history of stroke: Secondary | ICD-10-CM | POA: Diagnosis not present

## 2018-03-24 DIAGNOSIS — G4733 Obstructive sleep apnea (adult) (pediatric): Secondary | ICD-10-CM | POA: Insufficient documentation

## 2018-03-24 DIAGNOSIS — Z9889 Other specified postprocedural states: Secondary | ICD-10-CM | POA: Insufficient documentation

## 2018-03-24 DIAGNOSIS — M542 Cervicalgia: Secondary | ICD-10-CM | POA: Diagnosis not present

## 2018-03-24 DIAGNOSIS — Z803 Family history of malignant neoplasm of breast: Secondary | ICD-10-CM | POA: Diagnosis not present

## 2018-03-24 NOTE — Progress Notes (Signed)
Subjective:    Patient ID: [name] [name], female    DOB: 1951/06/15, 67 y.o.   MRN: [id number]  HPI Reason for referral is cervicalgia  67 year old female with history of chronic neck pain.  She had been evaluated by neurology in the past and diagnosed with a cervical dystonia.  She underwent cervical fusion at C4-C7 levels on 03/23/2016.  She did not get any relief after the surgery.  She has been treated in the past for movement disorders including multisystem atrophy however more recently has been diagnosed with progressive supranuclear palsy.  She has been prescribed Sinemet 25/100 2 tablets 3 times a day by neurology.  Patient lives in independent living facility.  She is wheelchair-bound and per neurology does not have ambulation goals due to safety issues.  The patient transfers from bed to wheelchair and wheelchair to toilet as well as to a lift chair.  Patient no longer drives  Pain Inventory Average Pain 5 Pain Right Now 4 My pain is intermittent, sharp and stabbing  In the last 24 hours, has pain interfered with the following? General activity 1 Relation with others 0 Enjoyment of life 5 What TIME of day is your pain at its worst? daytime Sleep (in general) Poor  Pain is worse with: sitting, inactivity and standing Pain improves with: medication Relief from Meds: 2  Mobility walk with assistance use a walker how many minutes can you walk? 3 ability to climb steps?  no do you drive?  no use a wheelchair needs help with transfers Do you have any goals in this area?  no  Function retired Do you have any goals in this area?  no  Neuro/Psych bladder control problems bowel control problems trouble walking dizziness depression anxiety  Prior Studies new  Physicians involved in your care new   Family History  Problem Relation Age of Onset  . Heart Problems Father   . Stroke Mother   . Hypertension Unknown   . Stroke Unknown   . Breast cancer  Sister    Social History   Socioeconomic History  . Marital status: Widowed    Spouse name: Not on file  . Number of children: 1  . Years of education: College  . Highest education level: Not on file  Occupational History  . Occupation: Therapist, sports: [employer]  . Financial resource strain: Not on file  . Food insecurity:    Worry: Not on file    Inability: Not on file  . Transportation needs:    Medical: Not on file    Non-medical: Not on file  Tobacco Use  . Smoking status: Never Smoker  . Smokeless tobacco: Never Used  Substance and Sexual Activity  . Alcohol use: Yes    Alcohol/week: 0.0 oz    Comment: 3- 4 times a year  . Drug use: No  . Sexual activity: Not on file  Lifestyle  . Physical activity:    Days per week: Not on file    Minutes per session: Not on file  . Stress: Not on file  Relationships  . Social connections:    Talks on phone: Not on file    Gets together: Not on file    Attends religious service: Not on file    Active member of club or organization: Not on file    Attends meetings of clubs or organizations: Not on file    Relationship status: Not on file  Other  Topics Concern  . Not on file  Social History Narrative   Patient lives at home with her dog name "Hot Rod".   Caffeine Use: 2-3 cups of coffee a day    Past Surgical History:  Procedure Laterality Date  . ANTERIOR CERVICAL DECOMP/DISCECTOMY FUSION N/A 03/23/2016   Procedure: Cervical Four-Five, Cervical Five-Six, Cervical Six-Seven Anterior cervical decompression/diskectomy/fusion;  Surgeon: Leeroy Cha, MD;  Location: [facility];  Service: Neurosurgery;  Laterality: N/A;  C4-5 C5-6 C6-7 Anterior cervical decompression/diskectomy/fusion  . CARDIOVASCULAR STRESS TEST  09-30-2007     normal Adenosine study/  no ischemia /  normal LV function and wall motion , ef 82%  . COLONOSCOPY    . CYSTOSCOPY WITH RETROGRADE PYELOGRAM, URETEROSCOPY AND STENT  PLACEMENT Right 05/12/2015   Procedure: CYSTOSCOPY WITH RETROGRADE PYELOGRAM, URETEROSCOPY,STONE EXTRACTION AND STENT PLACEMENT;  Surgeon: Franchot Gallo, MD;  Location: [facility];  Service: Urology;  Laterality: Right;  . EXTRACORPOREAL SHOCK WAVE LITHOTRIPSY Right 08-18-2012  . HOLMIUM LASER APPLICATION Right 2/59/5638   Procedure: HOLMIUM LASER APPLICATION;  Surgeon: Franchot Gallo, MD;  Location: [facility];  Service: Urology;  Laterality: Right;  . ORIF HUMERUS FRACTURE Right 07/20/2016   Procedure: OPEN REDUCTION INTERNAL FIXATION (ORIF) PROXIMAL HUMERUS FRACTURE VS REVERSE TOTAL SHOULDER ARTHROPLASTY;  Surgeon: Netta Cedars, MD;  Location: [facility];  Service: Orthopedics;  Laterality: Right;  . SHOULDER ARTHROSCOPY Right 07/2016  . TRANSTHORACIC ECHOCARDIOGRAM  09-23-2007   pseudonormal LV filling pattern,  ef 65-70%/  trivial AR/  mild LAE  . TUBAL LIGATION  1985   Past Medical History:  Diagnosis Date  . Arthritis    fingers,right shoulder  . 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 Chronic lumbar radiculopathy    L5- S1  right leg  . Depression   . Dizziness   . Frequency of urination   . Hard of hearing   . History of kidney stones 05/12/15   surgery   . HTN (hypertension)   . Hyperlipidemia   . 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 Numbness and tingling in hands   . OSA (obstructive sleep apnea)    moderate OSA per study 11-22-2007--  refused CPAP but used oxygen for 6 months at night,  states due to wt loss stopped using oxygen  . Polyneuropathy, diabetic ([place])    WALKS W/ CANE FOR BALANCE  . Right ureteral stone   . Shortness of breath dyspnea   . SUI (stress urinary incontinence, female)   . Type 2 diabetes mellitus (HCC)    Type II - Diet controlled  . Urinary incontinence   . Wears glasses    BP (!) 146/83 (BP Location: Right Arm, Patient Position: Sitting, Cuff  Size: Normal)   Pulse 85   Resp 14   Ht 5' 1.5" (1.562 m)   Wt 218 lb (98.9 kg)   SpO2 93%   BMI 40.52 kg/m   Opioid Risk Score:   Fall Risk Score:  `1  Depression screen PHQ 2/9  No flowsheet data found.  Review of Systems  Constitutional: Negative.   HENT: Negative.   Eyes: Negative.   Respiratory: Positive for apnea, cough and shortness of breath.   Cardiovascular: Positive for leg swelling.  Gastrointestinal: Negative.        Bowel control problems  Endocrine: Negative.        High blood  sugar  Genitourinary: Negative.        Bladder control problems  Musculoskeletal: Positive for arthralgias, back pain, gait problem, myalgias, neck pain and neck stiffness.  Skin: Negative.   Allergic/Immunologic: Negative.   Neurological: Positive for dizziness.  Hematological: Bruises/bleeds easily.  Psychiatric/Behavioral: Positive for dysphoric mood. The patient is nervous/anxious.   All other systems reviewed and are negative.      Objective:   Physical Exam  Constitutional: She is oriented to person, place, and time. She appears well-developed and well-nourished. No distress.  HENT:  Head: Normocephalic and atraumatic.  Eyes: Pupils are equal, round, and reactive to light. EOM are normal.  Right eye blepharospasm but can voluntarily open Right eye  No evidence of Nystagmus There is absent upward and downward gaze  Neck:  Right head tilt with laterocollis Right torticollis with prominent Left SCM Tenderness over right trapezius   Cardiovascular: Normal rate, regular rhythm and normal heart sounds.  Pulmonary/Chest: Effort normal and breath sounds normal. No stridor. No respiratory distress. She has no wheezes. She has no rales.  Abdominal: Soft. Bowel sounds are normal. She exhibits no distension. There is no tenderness.  Neurological: She is alert and oriented to person, place, and time. She has normal strength. She displays no atrophy and no tremor. No sensory  deficit. She exhibits normal muscle tone.  Hypophonic hyper kinetic dysarthria  Motor strength is 5/5 bilateral deltoid bicep tricep finger flexors and extensors hip flexor knee extensor ankle dorsiflexor   Romberg is not tested she is unable to stand with her feet together.  She feels like she falls towards the right side.  There is no evidence of cogwheel or leadpipe rigidity in the upper extremities.  Sensation is intact light touch bilateral upper and lower limbs  Skin: She is not diaphoretic.  Nursing note and vitals reviewed.         Assessment & Plan:  1.  Progressive supranuclear palsy with gait disorder severe balance issues, as per neurology no goals for ambulation given safety concerns.  Patient continues to transfer on and off the toilet as well as in and out of bed independently.  I would like physical therapy to check her technique on this make sure she is as safe as possible.  She is currently in independent living facility  She will follow-up with Dr. Carles Collet for medication management  2.  Cervical dystonia she has increased tone in the right trapezius, right splenius capitis, left sternocleidomastoid  Will ask physical therapy to work with her on this.  See her back in 1 month.  Consider botulinum toxin injection

## 2018-03-28 ENCOUNTER — Encounter (HOSPITAL_COMMUNITY)
Admission: EM | Disposition: A | Discharge status: Skilled Nursing Facility | Payer: Self-pay | Source: Home / Self Care | Attending: Internal Medicine

## 2018-03-28 ENCOUNTER — Encounter (HOSPITAL_COMMUNITY): Payer: Self-pay

## 2018-03-28 ENCOUNTER — Emergency Department (HOSPITAL_COMMUNITY): Payer: PPO

## 2018-03-28 ENCOUNTER — Other Ambulatory Visit: Payer: Self-pay

## 2018-03-28 ENCOUNTER — Other Ambulatory Visit: Payer: Self-pay | Admitting: Family Medicine

## 2018-03-28 ENCOUNTER — Inpatient Hospital Stay (HOSPITAL_COMMUNITY)
Admission: EM | Admit: 2018-03-28 | Discharge: 2018-04-08 | DRG: 853 | Disposition: A | Discharge status: Skilled Nursing Facility | Payer: PPO | Attending: Internal Medicine | Admitting: Internal Medicine

## 2018-03-28 ENCOUNTER — Ambulatory Visit
Admission: RE | Admit: 2018-03-28 | Discharge: 2018-03-28 | Disposition: A | Discharge status: Home / Self Care | Payer: PPO | Source: Ambulatory Visit | Attending: Family Medicine | Admitting: Family Medicine

## 2018-03-28 DIAGNOSIS — Z79899 Other long term (current) drug therapy: Secondary | ICD-10-CM

## 2018-03-28 DIAGNOSIS — I248 Other forms of acute ischemic heart disease: Secondary | ICD-10-CM | POA: Diagnosis present

## 2018-03-28 DIAGNOSIS — G231 Progressive supranuclear ophthalmoplegia [Steele-Richardson-Olszewski]: Secondary | ICD-10-CM | POA: Diagnosis present

## 2018-03-28 DIAGNOSIS — E1165 Type 2 diabetes mellitus with hyperglycemia: Secondary | ICD-10-CM | POA: Diagnosis not present

## 2018-03-28 DIAGNOSIS — H9190 Unspecified hearing loss, unspecified ear: Secondary | ICD-10-CM | POA: Diagnosis present

## 2018-03-28 DIAGNOSIS — A419 Sepsis, unspecified organism: Secondary | ICD-10-CM | POA: Diagnosis present

## 2018-03-28 DIAGNOSIS — N179 Acute kidney failure, unspecified: Secondary | ICD-10-CM | POA: Diagnosis present

## 2018-03-28 DIAGNOSIS — R778 Other specified abnormalities of plasma proteins: Secondary | ICD-10-CM | POA: Diagnosis present

## 2018-03-28 DIAGNOSIS — I110 Hypertensive heart disease with heart failure: Secondary | ICD-10-CM | POA: Diagnosis present

## 2018-03-28 DIAGNOSIS — E1142 Type 2 diabetes mellitus with diabetic polyneuropathy: Secondary | ICD-10-CM | POA: Diagnosis present

## 2018-03-28 DIAGNOSIS — N2889 Other specified disorders of kidney and ureter: Secondary | ICD-10-CM | POA: Diagnosis present

## 2018-03-28 DIAGNOSIS — M2550 Pain in unspecified joint: Secondary | ICD-10-CM | POA: Diagnosis not present

## 2018-03-28 DIAGNOSIS — R4182 Altered mental status, unspecified: Secondary | ICD-10-CM | POA: Diagnosis present

## 2018-03-28 DIAGNOSIS — E785 Hyperlipidemia, unspecified: Secondary | ICD-10-CM | POA: Diagnosis present

## 2018-03-28 DIAGNOSIS — Z823 Family history of stroke: Secondary | ICD-10-CM

## 2018-03-28 DIAGNOSIS — R109 Unspecified abdominal pain: Secondary | ICD-10-CM

## 2018-03-28 DIAGNOSIS — N132 Hydronephrosis with renal and ureteral calculous obstruction: Secondary | ICD-10-CM | POA: Diagnosis not present

## 2018-03-28 DIAGNOSIS — IMO0002 Reserved for concepts with insufficient information to code with codable children: Secondary | ICD-10-CM | POA: Diagnosis present

## 2018-03-28 DIAGNOSIS — E876 Hypokalemia: Secondary | ICD-10-CM | POA: Diagnosis present

## 2018-03-28 DIAGNOSIS — R6520 Severe sepsis without septic shock: Secondary | ICD-10-CM | POA: Diagnosis not present

## 2018-03-28 DIAGNOSIS — G9341 Metabolic encephalopathy: Secondary | ICD-10-CM | POA: Diagnosis present

## 2018-03-28 DIAGNOSIS — Z6841 Body Mass Index (BMI) 40.0 and over, adult: Secondary | ICD-10-CM | POA: Diagnosis not present

## 2018-03-28 DIAGNOSIS — G903 Multi-system degeneration of the autonomic nervous system: Secondary | ICD-10-CM | POA: Diagnosis present

## 2018-03-28 DIAGNOSIS — Z7982 Long term (current) use of aspirin: Secondary | ICD-10-CM

## 2018-03-28 DIAGNOSIS — Z7984 Long term (current) use of oral hypoglycemic drugs: Secondary | ICD-10-CM

## 2018-03-28 DIAGNOSIS — R6521 Severe sepsis with septic shock: Secondary | ICD-10-CM | POA: Diagnosis present

## 2018-03-28 DIAGNOSIS — E2749 Other adrenocortical insufficiency: Secondary | ICD-10-CM | POA: Diagnosis present

## 2018-03-28 DIAGNOSIS — N211 Calculus in urethra: Secondary | ICD-10-CM | POA: Diagnosis present

## 2018-03-28 DIAGNOSIS — E872 Acidosis: Secondary | ICD-10-CM | POA: Diagnosis present

## 2018-03-28 DIAGNOSIS — G4733 Obstructive sleep apnea (adult) (pediatric): Secondary | ICD-10-CM | POA: Diagnosis not present

## 2018-03-28 DIAGNOSIS — R7989 Other specified abnormal findings of blood chemistry: Secondary | ICD-10-CM | POA: Diagnosis not present

## 2018-03-28 DIAGNOSIS — R0602 Shortness of breath: Secondary | ICD-10-CM | POA: Diagnosis not present

## 2018-03-28 DIAGNOSIS — G8929 Other chronic pain: Secondary | ICD-10-CM | POA: Diagnosis present

## 2018-03-28 DIAGNOSIS — J9690 Respiratory failure, unspecified, unspecified whether with hypoxia or hypercapnia: Secondary | ICD-10-CM | POA: Diagnosis not present

## 2018-03-28 DIAGNOSIS — R001 Bradycardia, unspecified: Secondary | ICD-10-CM | POA: Diagnosis not present

## 2018-03-28 DIAGNOSIS — D72829 Elevated white blood cell count, unspecified: Secondary | ICD-10-CM | POA: Diagnosis not present

## 2018-03-28 DIAGNOSIS — Z9989 Dependence on other enabling machines and devices: Secondary | ICD-10-CM

## 2018-03-28 DIAGNOSIS — N12 Tubulo-interstitial nephritis, not specified as acute or chronic: Secondary | ICD-10-CM

## 2018-03-28 DIAGNOSIS — N390 Urinary tract infection, site not specified: Secondary | ICD-10-CM | POA: Diagnosis present

## 2018-03-28 DIAGNOSIS — E861 Hypovolemia: Secondary | ICD-10-CM | POA: Diagnosis present

## 2018-03-28 DIAGNOSIS — I5031 Acute diastolic (congestive) heart failure: Secondary | ICD-10-CM | POA: Diagnosis not present

## 2018-03-28 DIAGNOSIS — R300 Dysuria: Secondary | ICD-10-CM | POA: Diagnosis not present

## 2018-03-28 DIAGNOSIS — N200 Calculus of kidney: Secondary | ICD-10-CM | POA: Diagnosis not present

## 2018-03-28 DIAGNOSIS — N201 Calculus of ureter: Secondary | ICD-10-CM | POA: Diagnosis present

## 2018-03-28 DIAGNOSIS — Z825 Family history of asthma and other chronic lower respiratory diseases: Secondary | ICD-10-CM

## 2018-03-28 DIAGNOSIS — K219 Gastro-esophageal reflux disease without esophagitis: Secondary | ICD-10-CM | POA: Diagnosis present

## 2018-03-28 DIAGNOSIS — Z7401 Bed confinement status: Secondary | ICD-10-CM | POA: Diagnosis not present

## 2018-03-28 DIAGNOSIS — N393 Stress incontinence (female) (male): Secondary | ICD-10-CM | POA: Diagnosis present

## 2018-03-28 DIAGNOSIS — G9340 Encephalopathy, unspecified: Secondary | ICD-10-CM | POA: Diagnosis not present

## 2018-03-28 DIAGNOSIS — R000 Tachycardia, unspecified: Secondary | ICD-10-CM | POA: Diagnosis not present

## 2018-03-28 DIAGNOSIS — Z66 Do not resuscitate: Secondary | ICD-10-CM | POA: Diagnosis present

## 2018-03-28 DIAGNOSIS — Z8249 Family history of ischemic heart disease and other diseases of the circulatory system: Secondary | ICD-10-CM

## 2018-03-28 DIAGNOSIS — M542 Cervicalgia: Secondary | ICD-10-CM | POA: Diagnosis not present

## 2018-03-28 DIAGNOSIS — K5730 Diverticulosis of large intestine without perforation or abscess without bleeding: Secondary | ICD-10-CM | POA: Diagnosis present

## 2018-03-28 DIAGNOSIS — J9601 Acute respiratory failure with hypoxia: Secondary | ICD-10-CM | POA: Diagnosis not present

## 2018-03-28 DIAGNOSIS — Z01818 Encounter for other preprocedural examination: Secondary | ICD-10-CM

## 2018-03-28 DIAGNOSIS — R197 Diarrhea, unspecified: Secondary | ICD-10-CM | POA: Diagnosis not present

## 2018-03-28 DIAGNOSIS — R8271 Bacteriuria: Secondary | ICD-10-CM | POA: Diagnosis not present

## 2018-03-28 DIAGNOSIS — Z452 Encounter for adjustment and management of vascular access device: Secondary | ICD-10-CM

## 2018-03-28 DIAGNOSIS — Z981 Arthrodesis status: Secondary | ICD-10-CM

## 2018-03-28 DIAGNOSIS — Z886 Allergy status to analgesic agent status: Secondary | ICD-10-CM

## 2018-03-28 DIAGNOSIS — I5033 Acute on chronic diastolic (congestive) heart failure: Secondary | ICD-10-CM | POA: Diagnosis not present

## 2018-03-28 DIAGNOSIS — R509 Fever, unspecified: Secondary | ICD-10-CM | POA: Diagnosis not present

## 2018-03-28 DIAGNOSIS — Z96611 Presence of right artificial shoulder joint: Secondary | ICD-10-CM | POA: Diagnosis present

## 2018-03-28 DIAGNOSIS — N136 Pyonephrosis: Secondary | ICD-10-CM | POA: Diagnosis present

## 2018-03-28 DIAGNOSIS — Z6834 Body mass index (BMI) 34.0-34.9, adult: Secondary | ICD-10-CM

## 2018-03-28 DIAGNOSIS — I351 Nonrheumatic aortic (valve) insufficiency: Secondary | ICD-10-CM | POA: Diagnosis not present

## 2018-03-28 DIAGNOSIS — M19049 Primary osteoarthritis, unspecified hand: Secondary | ICD-10-CM | POA: Diagnosis present

## 2018-03-28 DIAGNOSIS — Z87442 Personal history of urinary calculi: Secondary | ICD-10-CM

## 2018-03-28 DIAGNOSIS — N10 Acute tubulo-interstitial nephritis: Secondary | ICD-10-CM | POA: Diagnosis not present

## 2018-03-28 DIAGNOSIS — Z9851 Tubal ligation status: Secondary | ICD-10-CM

## 2018-03-28 DIAGNOSIS — F329 Major depressive disorder, single episode, unspecified: Secondary | ICD-10-CM | POA: Diagnosis present

## 2018-03-28 DIAGNOSIS — M5416 Radiculopathy, lumbar region: Secondary | ICD-10-CM | POA: Diagnosis present

## 2018-03-28 DIAGNOSIS — Z803 Family history of malignant neoplasm of breast: Secondary | ICD-10-CM

## 2018-03-28 DIAGNOSIS — Z888 Allergy status to other drugs, medicaments and biological substances status: Secondary | ICD-10-CM

## 2018-03-28 DIAGNOSIS — E663 Overweight: Secondary | ICD-10-CM | POA: Diagnosis present

## 2018-03-28 DIAGNOSIS — R748 Abnormal levels of other serum enzymes: Secondary | ICD-10-CM | POA: Diagnosis not present

## 2018-03-28 DIAGNOSIS — Z4682 Encounter for fitting and adjustment of non-vascular catheter: Secondary | ICD-10-CM | POA: Diagnosis not present

## 2018-03-28 HISTORY — PX: CYSTOSCOPY WITH STENT PLACEMENT: SHX5790

## 2018-03-28 HISTORY — DX: Progressive supranuclear ophthalmoplegia (steele-Richardson-olszewski): G23.1

## 2018-03-28 LAB — CBG MONITORING, ED: GLUCOSE-CAPILLARY: 172 mg/dL — AB (ref 65–99)

## 2018-03-28 LAB — DIFFERENTIAL
Basophils Absolute: 0 10*3/uL (ref 0.0–0.1)
Basophils Relative: 0 %
EOS ABS: 0 10*3/uL (ref 0.0–0.7)
Eosinophils Relative: 1 %
Lymphocytes Relative: 14 %
Lymphs Abs: 0.4 10*3/uL — ABNORMAL LOW (ref 0.7–4.0)
Monocytes Absolute: 0 10*3/uL — ABNORMAL LOW (ref 0.1–1.0)
Monocytes Relative: 0 %
NEUTROS PCT: 85 %
Neutro Abs: 2.2 10*3/uL (ref 1.7–7.7)

## 2018-03-28 LAB — BLOOD GAS, ARTERIAL
ACID-BASE DEFICIT: 5.7 mmol/L — AB (ref 0.0–2.0)
BICARBONATE: 16.6 mmol/L — AB (ref 20.0–28.0)
Drawn by: 308601
O2 CONTENT: 3 L/min
O2 SAT: 97.7 %
PATIENT TEMPERATURE: 103.7
pCO2 arterial: 28.9 mmHg — ABNORMAL LOW (ref 32.0–48.0)
pH, Arterial: 7.393 (ref 7.350–7.450)
pO2, Arterial: 121 mmHg — ABNORMAL HIGH (ref 83.0–108.0)

## 2018-03-28 LAB — COMPREHENSIVE METABOLIC PANEL
ALBUMIN: 3.4 g/dL — AB (ref 3.5–5.0)
ALT: 10 U/L — AB (ref 14–54)
AST: 34 U/L (ref 15–41)
Alkaline Phosphatase: 111 U/L (ref 38–126)
Anion gap: 17 — ABNORMAL HIGH (ref 5–15)
BUN: 21 mg/dL — AB (ref 6–20)
CHLORIDE: 102 mmol/L (ref 101–111)
CO2: 19 mmol/L — AB (ref 22–32)
CREATININE: 1.4 mg/dL — AB (ref 0.44–1.00)
Calcium: 9.2 mg/dL (ref 8.9–10.3)
GFR calc Af Amer: 44 mL/min — ABNORMAL LOW (ref >60)
GFR calc non Af Amer: 38 mL/min — ABNORMAL LOW (ref >60)
GLUCOSE: 181 mg/dL — AB (ref 65–99)
POTASSIUM: 3.2 mmol/L — AB (ref 3.5–5.1)
Sodium: 138 mmol/L (ref 135–145)
Total Bilirubin: 1.3 mg/dL — ABNORMAL HIGH (ref 0.3–1.2)
Total Protein: 6.8 g/dL (ref 6.5–8.1)

## 2018-03-28 LAB — URINALYSIS, ROUTINE W REFLEX MICROSCOPIC
BILIRUBIN URINE: NEGATIVE
Glucose, UA: NEGATIVE mg/dL
KETONES UR: 5 mg/dL — AB
NITRITE: NEGATIVE
Protein, ur: 100 mg/dL — AB
Specific Gravity, Urine: 1.016 (ref 1.005–1.030)
pH: 5 (ref 5.0–8.0)

## 2018-03-28 LAB — I-STAT CG4 LACTIC ACID, ED: LACTIC ACID, VENOUS: 6.24 mmol/L — AB (ref 0.5–1.9)

## 2018-03-28 LAB — I-STAT CHEM 8, ED
BUN: 22 mg/dL — ABNORMAL HIGH (ref 6–20)
CHLORIDE: 103 mmol/L (ref 101–111)
Calcium, Ion: 1.18 mmol/L (ref 1.15–1.40)
Creatinine, Ser: 1.3 mg/dL — ABNORMAL HIGH (ref 0.44–1.00)
Glucose, Bld: 181 mg/dL — ABNORMAL HIGH (ref 65–99)
HCT: 43 % (ref 36.0–46.0)
Hemoglobin: 14.6 g/dL (ref 12.0–15.0)
POTASSIUM: 3.1 mmol/L — AB (ref 3.5–5.1)
Sodium: 138 mmol/L (ref 135–145)
TCO2: 19 mmol/L — ABNORMAL LOW (ref 22–32)

## 2018-03-28 LAB — CBC
HCT: 39.2 % (ref 36.0–46.0)
Hemoglobin: 12.7 g/dL (ref 12.0–15.0)
MCH: 29 pg (ref 26.0–34.0)
MCHC: 32.4 g/dL (ref 30.0–36.0)
MCV: 89.5 fL (ref 78.0–100.0)
PLATELETS: 142 10*3/uL — AB (ref 150–400)
RBC: 4.38 MIL/uL (ref 3.87–5.11)
RDW: 14 % (ref 11.5–15.5)
WBC: 2.6 10*3/uL — ABNORMAL LOW (ref 4.0–10.5)

## 2018-03-28 LAB — TSH: TSH: 0.989 u[IU]/mL (ref 0.350–4.500)

## 2018-03-28 LAB — I-STAT TROPONIN, ED: TROPONIN I, POC: 0.1 ng/mL — AB (ref 0.00–0.08)

## 2018-03-28 SURGERY — CYSTOSCOPY, WITH STENT INSERTION
Anesthesia: General | Site: Ureter | Laterality: Right

## 2018-03-28 MED ORDER — PIPERACILLIN-TAZOBACTAM 3.375 G IVPB 30 MIN
3.3750 g | Freq: Once | INTRAVENOUS | Status: AC
  Administered 2018-03-28: 3.375 g via INTRAVENOUS
  Filled 2018-03-28: qty 50

## 2018-03-28 MED ORDER — MIDAZOLAM HCL 2 MG/2ML IJ SOLN
INTRAMUSCULAR | Status: AC
  Filled 2018-03-28: qty 2

## 2018-03-28 MED ORDER — PROPOFOL 10 MG/ML IV BOLUS
INTRAVENOUS | Status: AC
  Filled 2018-03-28: qty 20

## 2018-03-28 MED ORDER — SODIUM CHLORIDE 0.9 % IR SOLN
Status: DC | PRN
  Administered 2018-03-28: 3000 mL

## 2018-03-28 MED ORDER — ACETAMINOPHEN 650 MG RE SUPP
650.0000 mg | Freq: Once | RECTAL | Status: AC
  Administered 2018-03-28: 650 mg via RECTAL
  Filled 2018-03-28: qty 1

## 2018-03-28 MED ORDER — ONDANSETRON HCL 4 MG/2ML IJ SOLN
INTRAMUSCULAR | Status: AC
  Filled 2018-03-28: qty 2

## 2018-03-28 MED ORDER — DEXAMETHASONE SODIUM PHOSPHATE 10 MG/ML IJ SOLN
INTRAMUSCULAR | Status: AC
  Filled 2018-03-28: qty 1

## 2018-03-28 MED ORDER — FENTANYL CITRATE (PF) 250 MCG/5ML IJ SOLN
INTRAMUSCULAR | Status: AC
  Filled 2018-03-28: qty 5

## 2018-03-28 MED ORDER — SODIUM CHLORIDE 0.9 % IV BOLUS
1000.0000 mL | Freq: Once | INTRAVENOUS | Status: AC
  Administered 2018-03-28: 1000 mL via INTRAVENOUS

## 2018-03-28 MED ORDER — SODIUM CHLORIDE 0.9 % IV BOLUS
2000.0000 mL | Freq: Once | INTRAVENOUS | Status: AC
  Administered 2018-03-28: 2000 mL via INTRAVENOUS

## 2018-03-28 MED ORDER — VANCOMYCIN HCL 10 G IV SOLR
2000.0000 mg | Freq: Once | INTRAVENOUS | Status: AC
  Administered 2018-03-28: 2000 mg via INTRAVENOUS
  Filled 2018-03-28: qty 2000

## 2018-03-28 SURGICAL SUPPLY — 13 items
BAG URO CATCHER STRL LF (MISCELLANEOUS) ×3 IMPLANT
CATH URET 5FR 28IN OPEN ENDED (CATHETERS) ×3 IMPLANT
CLOTH BEACON ORANGE TIMEOUT ST (SAFETY) ×3 IMPLANT
COVER FOOTSWITCH UNIV (MISCELLANEOUS) IMPLANT
COVER SURGICAL LIGHT HANDLE (MISCELLANEOUS) ×3 IMPLANT
GLOVE BIOGEL M STRL SZ7.5 (GLOVE) ×3 IMPLANT
GOWN STRL REUS W/TWL LRG LVL3 (GOWN DISPOSABLE) ×6 IMPLANT
GUIDEWIRE STR DUAL SENSOR (WIRE) ×3 IMPLANT
MANIFOLD NEPTUNE II (INSTRUMENTS) ×3 IMPLANT
PACK CYSTO (CUSTOM PROCEDURE TRAY) ×3 IMPLANT
STENT URET 6FRX24 CONTOUR (STENTS) ×3 IMPLANT
TUBING CONNECTING 10 (TUBING) IMPLANT
TUBING CONNECTING 10' (TUBING)

## 2018-03-28 NOTE — ED Notes (Signed)
Pt en route to OR via stretcher and OR RN at this time

## 2018-03-28 NOTE — ED Notes (Signed)
Pt requested mouth swab. Removed 2 pieces of chewing gum stuck to teeth.

## 2018-03-28 NOTE — ED Provider Notes (Signed)
[place] DEPT Provider Note   CSN: [id number] Arrival date & time: 03/28/18  1952     History   Chief Complaint Chief Complaint  Patient presents with  . Code Sepsis    HPI [name] [name] is a 67 y.o. female.  67 yo F with a chief complaint of altered mental status.  Patient was seen earlier today and found to have pyelonephritis and was told to come to the hospital to be admitted.  Patient instead went home and then got progressively worse throughout the evening.  When EMS arrived the patient was having wandering eye movements and was very minimally responsive.  Noted to be very diaphoretic and tachypneic.  She had had one episode of emesis.  Old records were reviewed and the patient has multiple neurologic disorders.  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  The history is provided by the patient and the EMS personnel.  Illness  This is a new problem. The current episode started yesterday. The problem occurs constantly. The problem has been rapidly worsening. Pertinent negatives include no chest pain, no headaches and no shortness of breath. Nothing aggravates the symptoms. Nothing relieves the symptoms. She has tried nothing for the symptoms. The treatment provided no relief.    Past Medical History:  Diagnosis Date  . Arthritis    fingers,right shoulder  . 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 Chronic lumbar radiculopathy    L5- S1  right leg  . Depression   . Dizziness   . Frequency of urination   . Hard of hearing   . History of kidney stones 05/12/15   surgery   . HTN (hypertension)   . Hyperlipidemia   . 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 Numbness and tingling in hands   . OSA (obstructive sleep apnea)    moderate OSA per study 11-22-2007--  refused CPAP but used oxygen for 6 months at night,  states due to wt loss  stopped using oxygen  . Polyneuropathy, diabetic ([place])    WALKS W/ CANE FOR BALANCE  . Right ureteral stone   . Shortness of breath dyspnea   . SUI (stress urinary incontinence, female)   . Type 2 diabetes mellitus (HCC)    Type II - Diet controlled  . Urinary incontinence   . Wears glasses     Patient Active Problem List   Diagnosis Date Noted  . PSP (progressive supranuclear palsy) ([place]) 02/18/2018  . Chronic cough 11/19/2017  . 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 Cervical stenosis of spinal canal 03/23/2016  . OSA on CPAP 12/14/2015  . Dizziness and giddiness 11/24/2013  . Diabetes type 2, uncontrolled ([place]) 11/24/2013  . Severe obesity (BMI >= 40) ([place]) 11/24/2013  . Lumbar radiculopathy 11/24/2013    Past Surgical History:  Procedure Laterality Date  . ANTERIOR CERVICAL DECOMP/DISCECTOMY FUSION N/A 03/23/2016   Procedure: Cervical Four-Five, Cervical Five-Six, Cervical Six-Seven Anterior cervical decompression/diskectomy/fusion;  Surgeon: Leeroy Cha, MD;  Location: [facility];  Service: Neurosurgery;  Laterality: N/A;  C4-5 C5-6 C6-7 Anterior cervical decompression/diskectomy/fusion  . CARDIOVASCULAR STRESS TEST  09-30-2007     normal Adenosine study/  no ischemia /  normal LV function and wall motion , ef 82%  . COLONOSCOPY    . CYSTOSCOPY WITH RETROGRADE PYELOGRAM, URETEROSCOPY AND STENT PLACEMENT Right 05/12/2015  Procedure: CYSTOSCOPY WITH RETROGRADE PYELOGRAM, URETEROSCOPY,STONE EXTRACTION AND STENT PLACEMENT;  Surgeon: Franchot Gallo, MD;  Location: [facility];  Service: Urology;  Laterality: Right;  . EXTRACORPOREAL SHOCK WAVE LITHOTRIPSY Right 08-18-2012  . HOLMIUM LASER APPLICATION Right 1/85/6314   Procedure: HOLMIUM LASER APPLICATION;  Surgeon: Franchot Gallo, MD;  Location: [facility];  Service: Urology;  Laterality: Right;  . ORIF HUMERUS FRACTURE Right 07/20/2016   Procedure: OPEN REDUCTION INTERNAL FIXATION (ORIF)  PROXIMAL HUMERUS FRACTURE VS REVERSE TOTAL SHOULDER ARTHROPLASTY;  Surgeon: Netta Cedars, MD;  Location: [facility];  Service: Orthopedics;  Laterality: Right;  . SHOULDER ARTHROSCOPY Right 07/2016  . TRANSTHORACIC ECHOCARDIOGRAM  09-23-2007   pseudonormal LV filling pattern,  ef 65-70%/  trivial AR/  mild LAE  . TUBAL LIGATION  1985     OB History   None      Home Medications    Prior to Admission medications   Medication Sig Start Date End Date Taking? Authorizing Provider  aspirin 81 MG tablet Take 81 mg by mouth at bedtime.    Yes [provider]  azelastine (ASTELIN) 0.1 % nasal spray Place 1 spray into both nostrils 2 (two) times daily.  12/23/14  Yes [provider]  carbidopa-levodopa (SINEMET IR) 25-100 MG tablet 2 tablets TID Patient taking differently: Take 3 tablets by mouth at bedtime. 2 tablets TID 02/07/18  Yes Tat, Eustace Quail, DO  Cholecalciferol (VITAMIN D PO) Take 5,000 Units by mouth daily.   Yes [provider]  ciprofloxacin (CIPRO) 500 MG tablet Take 500 mg by mouth 2 (two) times daily.   Yes [provider]  cyanocobalamin 500 MCG tablet Take 500 mcg by mouth daily.   Yes [provider]  escitalopram (LEXAPRO) 10 MG tablet Take 15 mg at bedtime by mouth.  06/08/16  Yes [provider]  fenofibrate 160 MG tablet Take 160 mg by mouth every morning.    Yes [provider]  fluticasone (FLONASE) 50 MCG/ACT nasal spray Place 2 sprays into both nostrils daily.  12/23/14  Yes [provider]  loratadine (CLARITIN) 10 MG tablet Take 10 mg by mouth. Taking every other day.   Yes [provider]  Melatonin 5 MG CAPS Take 1 capsule by mouth at bedtime as needed. For sleep   Yes [provider]  metFORMIN (GLUCOPHAGE) 500 MG tablet Take 250 mg by mouth 2 (two) times daily with a meal.    Yes [provider]  montelukast (SINGULAIR) 10 MG tablet Take 10 mg by mouth at bedtime.   Yes  [provider]  nystatin cream (MYCOSTATIN) Apply 1 application topically 2 (two) times daily as needed for dry skin.   Yes [provider]  olmesartan (BENICAR) 40 MG tablet Take 40 mg by mouth daily. 02/21/18  Yes [provider]  Propylene Glycol (SYSTANE BALANCE OP) Place 2 drops into both eyes 2 (two) times daily as needed (dry eyes).    Yes [provider]  ranitidine (ZANTAC) 150 MG tablet Take 150 mg by mouth 2 (two) times daily.   Yes [provider]  Menthol, Topical Analgesic, (BIOFREEZE) 4 % GEL Apply topically.    [provider]    Family History Family History  Problem Relation Age of Onset  . Heart Problems Father   . Stroke Mother   . Hypertension Unknown   . Stroke Unknown   . Breast cancer Sister     Social History Social History  Tobacco Use  . Smoking status: Never Smoker  . Smokeless tobacco: Never Used  Substance Use Topics  . Alcohol use: Yes    Alcohol/week: 0.0 oz    Comment: 3- 4 times a year  . Drug use: No     Allergies   Aleve [naproxen] and Zocor [simvastatin]   Review of Systems Review of Systems  Unable to perform ROS: Mental status change  Constitutional: Negative for chills and fever.  HENT: Negative for congestion and rhinorrhea.   Eyes: Negative for redness and visual disturbance.  Respiratory: Negative for shortness of breath and wheezing.   Cardiovascular: Negative for chest pain and palpitations.  Gastrointestinal: Negative for nausea and vomiting.  Genitourinary: Negative for dysuria and urgency.  Musculoskeletal: Negative for arthralgias and myalgias.  Skin: Negative for pallor and wound.  Neurological: Negative for dizziness and headaches.     Physical Exam Updated Vital Signs BP 138/88   Pulse (!) 114   Temp (!) 103.7 F (39.8 C) (Rectal)   Resp (!) 25   Ht 5' 0.5" (1.537 m)   Wt 98.9 kg (218 lb)   SpO2 99%   BMI 41.87 kg/m   Physical Exam    Constitutional: She appears well-developed and well-nourished. She appears distressed.  HENT:  Head: Normocephalic and atraumatic.  Eyes: Pupils are equal, round, and reactive to light. EOM are normal.  Neck: Normal range of motion. Neck supple.  Cardiovascular: Regular rhythm. Tachycardia present. Exam reveals no gallop and no friction rub.  No murmur heard. Pulmonary/Chest: She is in respiratory distress. She has no wheezes. She has no rales.  Abdominal: Soft. She exhibits no distension. There is no tenderness.  Musculoskeletal: She exhibits no edema or tenderness.  Neurological: She is alert.  Confused, answers yes or no but doesn't make sense, follows some commands.    Skin: Skin is warm. She is diaphoretic.  Psychiatric: She has a normal mood and affect. Her behavior is normal.  Nursing note and vitals reviewed.    ED Treatments / Results  Labs (all labs ordered are listed, but only abnormal results are displayed) Labs Reviewed  COMPREHENSIVE METABOLIC PANEL - Abnormal; Notable for the following components:      Result Value   Potassium 3.2 (*)    CO2 19 (*)    Glucose, Bld 181 (*)    BUN 21 (*)    Creatinine, Ser 1.40 (*)    Albumin 3.4 (*)    ALT 10 (*)    Total Bilirubin 1.3 (*)    GFR calc non Af Amer 38 (*)    GFR calc Af Amer 44 (*)    Anion gap 17 (*)    All other components within normal limits  URINALYSIS, ROUTINE W REFLEX MICROSCOPIC - Abnormal; Notable for the following components:   APPearance HAZY (*)    Hgb urine dipstick MODERATE (*)    Ketones, ur 5 (*)    Protein, ur 100 (*)    Leukocytes, UA SMALL (*)    Bacteria, UA RARE (*)    All other components within normal limits  BLOOD GAS, ARTERIAL - Abnormal; Notable for the following components:   pCO2 arterial 28.9 (*)    pO2, Arterial 121 (*)    Bicarbonate 16.6 (*)    Acid-base deficit 5.7 (*)    All other components within normal limits  CBC - Abnormal; Notable for the following components:    WBC 2.6 (*)    Platelets 142 (*)  All other components within normal limits  DIFFERENTIAL - Abnormal; Notable for the following components:   Lymphs Abs 0.4 (*)    Monocytes Absolute 0.0 (*)    All other components within normal limits  I-STAT CG4 LACTIC ACID, ED - Abnormal; Notable for the following components:   Lactic Acid, Venous 6.24 (*)    All other components within normal limits  I-STAT CHEM 8, ED - Abnormal; Notable for the following components:   Potassium 3.1 (*)    BUN 22 (*)    Creatinine, Ser 1.30 (*)    Glucose, Bld 181 (*)    TCO2 19 (*)    All other components within normal limits  I-STAT TROPONIN, ED - Abnormal; Notable for the following components:   Troponin i, poc 0.10 (*)    All other components within normal limits  CBG MONITORING, ED - Abnormal; Notable for the following components:   Glucose-Capillary 172 (*)    All other components within normal limits  CULTURE, BLOOD (ROUTINE X 2)  CULTURE, BLOOD (ROUTINE X 2)  URINE CULTURE  TSH  CBC WITH DIFFERENTIAL/PLATELET  T4  I-STAT CG4 LACTIC ACID, ED    EKG EKG Interpretation  Date/Time:  Friday Mar 28 2018 20:00:47 EDT Ventricular Rate:  136 PR Interval:    QRS Duration: 93 QT Interval:  329 QTC Calculation: 495 R Axis:   -68 Text Interpretation:  Sinus tachycardia Inferior infarct, old Consider anterior infarct Baseline wander in lead(s) V5 Since last tracing rate faster Otherwise no significant change Confirmed by Deno Etienne [id number]) on 03/28/2018 8:44:04 PM   Radiology Dg Abd 1 View  Result Date: 03/28/2018 CLINICAL DATA:  Right flank pain EXAM: ABDOMEN - 1 VIEW COMPARISON:  04/15/2017 FINDINGS: Negative for urinary tract calculi. Normal bowel gas pattern. Lumbar levoscoliosis with degenerative change. IMPRESSION: Negative for renal calculi. Electronically Signed   By: Franchot Gallo M.D.   On: 03/28/2018 16:33   Dg Chest Port 1 View  Result Date: 03/28/2018 CLINICAL DATA:  Sepsis, fever,  short of breath EXAM: PORTABLE CHEST 1 VIEW COMPARISON:  Chest x-ray dated 12/19/2017. FINDINGS: New interstitial and/or micronodular opacities at the LEFT lung base, suspicious for edema or pneumonia. No pleural effusion or pneumothorax seen. Heart size and mediastinal contours are stable. No acute or suspicious osseous finding. IMPRESSION: New subtle interstitial and/or micronodular opacities at the LEFT lung base, suspicious for interstitial edema and/or pneumonia. Electronically Signed   By: Franki Cabot M.D.   On: 03/28/2018 20:19   Ct Renal Stone Study  Result Date: 03/28/2018 CLINICAL DATA:  Patient found unresponsive. Elevated respiratory rate, heart rate and fever. Decreased level of consciousness. EXAM: CT ABDOMEN AND PELVIS WITHOUT CONTRAST TECHNIQUE: Multidetector CT imaging of the abdomen and pelvis was performed following the standard protocol without IV contrast. COMPARISON:  Abdominal radiograph performed earlier today at 12:16 p.m., and CT of the abdomen and pelvis performed 04/28/2015 FINDINGS: Lower chest: Minimal bibasilar atelectasis is noted. The visualized portions of the mediastinum are unremarkable. Hepatobiliary: The liver is unremarkable in appearance. The gallbladder is unremarkable in appearance. The common bile duct remains normal in caliber. Pancreas: The pancreas is within normal limits. Spleen: The spleen is unremarkable in appearance. Adrenals/Urinary Tract: The adrenal glands are unremarkable in appearance. Scattered stones noted at the right renal pelvis and renal calyces, measuring up to 6 mm in size. Associated scattered air is noted at the right renal pelvis and right renal calyces. Would correlate clinically for evidence of emphysematous pyelonephritis.  There appears to be an obstructing 5 mm stone at the distal right ureter, just above the right vesicoureteral junction, with mild right-sided hydronephrosis. Diffuse right-sided perinephric stranding and fluid are seen.  Bilateral renal cysts are noted. A 2.4 cm isodense lesion is noted arising at the lower pole of the left kidney, increased in size from 2016. Malignancy cannot be excluded. Stomach/Bowel: The stomach is unremarkable in appearance. The small bowel is within normal limits. The appendix is normal in caliber, without evidence of appendicitis. Scattered diverticulosis is noted along the descending colon, without evidence of diverticulitis. Vascular/Lymphatic: Minimal calcification is seen along the abdominal aorta. No retroperitoneal or pelvic sidewall lymphadenopathy is seen. Reproductive: The bladder is decompressed, with a Foley catheter in place. Diffuse bladder wall thickening may reflect chronic inflammation. The uterus is grossly unremarkable in appearance. No suspicious adnexal masses are seen. Other: No additional soft tissue abnormalities are seen. Musculoskeletal: No acute osseous abnormalities are identified. Mild multilevel vacuum phenomenon is noted along the lumbar spine. The visualized musculature is unremarkable in appearance. IMPRESSION: 1. Scattered stones at the right renal pelvis and renal calyces, measuring up to 6 mm in size. Associated scattered air noted at the right renal pelvis and right renal calyces. Diffuse right-sided perinephric stranding and fluid. Would correlate clinically for evidence of emphysematous pyelonephritis. 2. Obstructing 5 mm stone at the distal right ureter, just above the right vesicoureteral junction, with mild right-sided hydronephrosis. 3. 2.4 cm isodense lesion arising at the lower pole of the left kidney, increased in size from 2016. The malignancy cannot be excluded. Contrast-enhanced MRI or CT would be helpful for further evaluation, when and as deemed clinically appropriate. 4. Diffuse bladder wall thickening may reflect chronic inflammation. 5. Scattered diverticulosis along the descending colon, without evidence of diverticulitis. Electronically Signed   By:  Garald Balding M.D.   On: 03/28/2018 22:07    Procedures Procedures (including critical care time)  Medications Ordered in ED Medications  vancomycin (VANCOCIN) 2,000 mg in sodium chloride 0.9 % 500 mL IVPB (2,000 mg Intravenous New Bag/Given 03/28/18 2039)  piperacillin-tazobactam (ZOSYN) IVPB 3.375 g (0 g Intravenous Stopped 03/28/18 2111)  sodium chloride 0.9 % bolus 1,000 mL (0 mLs Intravenous Stopped 03/28/18 2039)  acetaminophen (TYLENOL) suppository 650 mg (650 mg Rectal Given 03/28/18 2029)  sodium chloride 0.9 % bolus 2,000 mL (2,000 mLs Intravenous New Bag/Given 03/28/18 2057)     Initial Impression / Assessment and Plan / ED Course  I have reviewed the triage vital signs and the nursing notes.  Pertinent labs & imaging results that were available during my care of the patient were reviewed by me and considered in my medical decision making (see chart for details).     67 yo F with a chief complaint of altered mental status.  Per the family she was seen earlier today and diagnosed with a kidney infection.  She had x-ray that was negative for kidney stone and was sent home on ciprofloxacin.  She had 1 dose of the medication and became significantly more altered and was sent to the ED.  Here she had a temperature of 104 had a lactate of 6.2 troponin of 0.1.  She had some improvement with IV fluids and antibiotics.  Upon hearing that she was being worked up for a kidney stone I ordered a CT stone study which was positive for an obstructing stone.  Discussed with urology who will take for stenting.  I discussed the case with critical care.  Felt  that the patient could be admitted to the hospitalist.  They felt that the patient left the OR intubated and they would consult if needed.  CRITICAL CARE Performed by: Cecilio Asper   Total critical care time: 80 minutes  Critical care time was exclusive of separately billable procedures and treating other patients.  Critical care was  necessary to treat or prevent imminent or life-threatening deterioration.  Critical care was time spent personally by me on the following activities: development of treatment plan with patient and/or surrogate as well as nursing, discussions with consultants, evaluation of patient's response to treatment, examination of patient, obtaining history from patient or surrogate, ordering and performing treatments and interventions, ordering and review of laboratory studies, ordering and review of radiographic studies, pulse oximetry and re-evaluation of patient's condition.  The patients results and plan were reviewed and discussed.   Any x-rays performed were independently reviewed by myself.   Differential diagnosis were considered with the presenting HPI.  Medications  vancomycin (VANCOCIN) 2,000 mg in sodium chloride 0.9 % 500 mL IVPB (2,000 mg Intravenous New Bag/Given 03/28/18 2039)  piperacillin-tazobactam (ZOSYN) IVPB 3.375 g (0 g Intravenous Stopped 03/28/18 2111)  sodium chloride 0.9 % bolus 1,000 mL (0 mLs Intravenous Stopped 03/28/18 2039)  acetaminophen (TYLENOL) suppository 650 mg (650 mg Rectal Given 03/28/18 2029)  sodium chloride 0.9 % bolus 2,000 mL (2,000 mLs Intravenous New Bag/Given 03/28/18 2057)    Vitals:   03/28/18 2110 03/28/18 2112 03/28/18 2223 03/28/18 2231  BP:    138/88  Pulse:   (!) 115 (!) 114  Resp:   (!) 24 (!) 25  Temp:      TempSrc:      SpO2:  100% 99% 99%  Weight: 98.9 kg (218 lb)     Height: 5' 0.5" (1.537 m)       Final diagnoses:  Nephrolithiasis  Pyelonephritis  Severe sepsis ([place])    Admission/ observation were discussed with the admitting physician, patient and/or family and they are comfortable with the plan.   Final Clinical Impressions(s) / ED Diagnoses   Final diagnoses:  Nephrolithiasis  Pyelonephritis  Severe sepsis [facility])    ED Discharge Orders    None       Deno Etienne, DO 03/28/18 2251

## 2018-03-28 NOTE — ED Notes (Signed)
Bed: RESB Expected date:  Expected time:  Means of arrival:  Comments: EMS Code sepsis 67 yo female from home/primary today-UTI-unresponsive now except to pain 109/64 HR 154

## 2018-03-28 NOTE — Consult Note (Signed)
Urology Consult Note   Requesting Attending Physician:  Deno Etienne, DO Service Providing Consult: Urology  Consulting Attending: Louis Meckel   Reason for Consult:  Right obstructing stone, Sepsis   HPI: [name] [name] is seen in consultation for reasons noted above at the request of Deno Etienne, DO for evaluation of fever, ams and obstructing sone.  This is a 67 y.o. 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   Has had right flank pain and confusion for the past few days. Taken to PCP this morning - they were concerned for a UTI. KUB performed at [facility] to r/o stone - negative for calculi. The patient became more confused and febrile this evening and was brought by EMS to ED.   Febrile to 103 and tachycardic  WBC - 2.6  Cr - 1.30  U/A - small LE, negative nitrites, rare bacteria, 21-50 WBC CT revealed multiple stone scattered throughout the right renal system with an obstructing 87mm distal ureteral stone. Right perinephric stranding.  Air within the collecting system - on review no air seen within the parenchyma which would raise concern for emphysematous pyelonephritis.   On evaluation in the ED, [name] [name] was oriented by slightly somnolent. Slight R flank tenderness. Tachycardic to 120. Foley in place draining murky urine  Past Medical History: Past Medical History:  Diagnosis Date  . Arthritis    fingers,right shoulder  . 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 Chronic lumbar radiculopathy    L5- S1  right leg  . Depression   . Dizziness   . Frequency of urination   . Hard of hearing   . History of kidney stones 05/12/15   surgery   . HTN (hypertension)   . Hyperlipidemia   . 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[place])   . 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[place])   . Numbness and tingling in hands   . OSA (obstructive sleep apnea)     moderate OSA per study 11-22-2007--  refused CPAP but used oxygen for 6 months at night,  states due to wt loss stopped using oxygen  . Polyneuropathy, diabetic ([place])    WALKS W/ CANE FOR BALANCE  . Right ureteral stone   . Shortness of breath dyspnea   . SUI (stress urinary incontinence, female)   . Type 2 diabetes mellitus (HCC)    Type II - Diet controlled  . Urinary incontinence   . Wears glasses     Past Surgical History:  Past Surgical History:  Procedure Laterality Date  . ANTERIOR CERVICAL DECOMP/DISCECTOMY FUSION N/A 03/23/2016   Procedure: Cervical Four-Five, Cervical Five-Six, Cervical Six-Seven Anterior cervical decompression/diskectomy/fusion;  Surgeon: Leeroy Cha, MD;  Location: [facility];  Service: Neurosurgery;  Laterality: N/A;  C4-5 C5-6 C6-7 Anterior cervical decompression/diskectomy/fusion  . CARDIOVASCULAR STRESS TEST  09-30-2007     normal Adenosine study/  no ischemia /  normal LV function and wall motion , ef 82%  . COLONOSCOPY    . CYSTOSCOPY WITH RETROGRADE PYELOGRAM, URETEROSCOPY AND STENT PLACEMENT Right 05/12/2015   Procedure: CYSTOSCOPY WITH RETROGRADE PYELOGRAM, URETEROSCOPY,STONE EXTRACTION AND STENT PLACEMENT;  Surgeon: Franchot Gallo, MD;  Location: [facility];  Service: Urology;  Laterality: Right;  . EXTRACORPOREAL SHOCK WAVE LITHOTRIPSY Right 08-18-2012  . HOLMIUM LASER APPLICATION Right 0/97/3532   Procedure: HOLMIUM LASER APPLICATION;  Surgeon: Franchot Gallo, MD;  Location: [facility];  Service: Urology;  Laterality: Right;  . ORIF HUMERUS FRACTURE Right 07/20/2016   Procedure: OPEN REDUCTION INTERNAL FIXATION (ORIF) PROXIMAL HUMERUS FRACTURE VS REVERSE TOTAL SHOULDER ARTHROPLASTY;  Surgeon: Netta Cedars, MD;  Location: [place];  Service: Orthopedics;  Laterality: Right;  . SHOULDER ARTHROSCOPY Right 07/2016  . TRANSTHORACIC ECHOCARDIOGRAM  09-23-2007   pseudonormal LV filling pattern,  ef 65-70%/  trivial AR/   mild LAE  . TUBAL LIGATION  1985    Medication: Current Facility-Administered Medications  Medication Dose Route Frequency Provider Last Rate Last Dose  . vancomycin (VANCOCIN) 2,000 mg in sodium chloride 0.9 % 500 mL IVPB  2,000 mg Intravenous Once Deno Etienne, DO 250 mL/hr at 03/28/18 2039 2,000 mg at 03/28/18 2039   Current Outpatient Medications  Medication Sig Dispense Refill  . aspirin 81 MG tablet Take 81 mg by mouth at bedtime.     Marland Kitchen azelastine (ASTELIN) 0.1 % nasal spray Place 1 spray into both nostrils 2 (two) times daily.   11  . carbidopa-levodopa (SINEMET IR) 25-100 MG tablet 2 tablets TID (Patient taking differently: Take 3 tablets by mouth at bedtime. 2 tablets TID) 540 tablet 0  . Cholecalciferol (VITAMIN D PO) Take 5,000 Units by mouth daily.    . ciprofloxacin (CIPRO) 500 MG tablet Take 500 mg by mouth 2 (two) times daily.    . cyanocobalamin 500 MCG tablet Take 500 mcg by mouth daily.    Marland Kitchen escitalopram (LEXAPRO) 10 MG tablet Take 15 mg at bedtime by mouth.   2  . fenofibrate 160 MG tablet Take 160 mg by mouth every morning.     . fluticasone (FLONASE) 50 MCG/ACT nasal spray Place 2 sprays into both nostrils daily.   11  . loratadine (CLARITIN) 10 MG tablet Take 10 mg by mouth. Taking every other day.    . Melatonin 5 MG CAPS Take 1 capsule by mouth at bedtime as needed. For sleep    . metFORMIN (GLUCOPHAGE) 500 MG tablet Take 250 mg by mouth 2 (two) times daily with a meal.     . montelukast (SINGULAIR) 10 MG tablet Take 10 mg by mouth at bedtime.    Marland Kitchen nystatin cream (MYCOSTATIN) Apply 1 application topically 2 (two) times daily as needed for dry skin.    Marland Kitchen olmesartan (BENICAR) 40 MG tablet Take 40 mg by mouth daily.  3  . Propylene Glycol (SYSTANE BALANCE OP) Place 2 drops into both eyes 2 (two) times daily as needed (dry eyes).     . ranitidine (ZANTAC) 150 MG tablet Take 150 mg by mouth 2 (two) times daily.    . Menthol, Topical Analgesic, (BIOFREEZE) 4 % GEL Apply  topically.      Allergies: Allergies  Allergen Reactions  . Aleve [Naproxen] Hives, Shortness Of Breath and Rash    Pt Avoids All Nsaids  . Zocor [Simvastatin] Other (See Comments)    "weird feeling all over body"    Social History: Social History   Tobacco Use  . Smoking status: Never Smoker  . Smokeless tobacco: Never Used  Substance Use Topics  . Alcohol use: Yes    Alcohol/week: 0.0 oz    Comment: 3- 4 times a year  . Drug use: No    Family History Family History  Problem Relation Age of Onset  . Heart Problems Father   . Stroke Mother   . Hypertension Unknown   . Stroke Unknown   .  Breast cancer Sister     Review of Systems 10 systems were reviewed and are negative except as noted specifically in the HPI.  Objective   Vital signs in last 24 hours: BP 136/85 (BP Location: Right Arm)   Pulse (!) 123   Temp (!) 103.7 F (39.8 C) (Rectal)   Resp 20   Ht 5' 0.5" (1.537 m)   Wt 98.9 kg (218 lb)   SpO2 100%   BMI 41.87 kg/m   Physical Exam General: Lying in bed, somnolent HEENT: [place]/AT, EOMI, MMM Pulmonary: Normal work of breathing Cardiovascular: HDS, adequate peripheral perfusion Abdomen: Soft, NTTP, nondistended,  GU: right CVA tenderness, foley draining murky yellow urine Extremities: warm and well perfused Neuro: Difficulty speaking   Most Recent Labs: Lab Results  Component Value Date   WBC 2.6 (L) 03/28/2018   HGB 12.7 03/28/2018   HCT 39.2 03/28/2018   PLT 142 (L) 03/28/2018    Lab Results  Component Value Date   NA 138 03/28/2018   K 3.1 (L) 03/28/2018   CL 103 03/28/2018   CO2 19 (L) 03/28/2018   BUN 22 (H) 03/28/2018   CREATININE 1.30 (H) 03/28/2018   CALCIUM 9.2 03/28/2018    Lab Results  Component Value Date   INR 1.0 09/22/2007   APTT 25 09/22/2007     IMAGING: Dg Abd 1 View  Result Date: 03/28/2018 CLINICAL DATA:  Right flank pain EXAM: ABDOMEN - 1 VIEW COMPARISON:  04/15/2017 FINDINGS: Negative for urinary tract  calculi. Normal bowel gas pattern. Lumbar levoscoliosis with degenerative change. IMPRESSION: Negative for renal calculi. Electronically Signed   By: Franchot Gallo M.D.   On: 03/28/2018 16:33   Dg Chest Port 1 View  Result Date: 03/28/2018 CLINICAL DATA:  Sepsis, fever, short of breath EXAM: PORTABLE CHEST 1 VIEW COMPARISON:  Chest x-ray dated 12/19/2017. FINDINGS: New interstitial and/or micronodular opacities at the LEFT lung base, suspicious for edema or pneumonia. No pleural effusion or pneumothorax seen. Heart size and mediastinal contours are stable. No acute or suspicious osseous finding. IMPRESSION: New subtle interstitial and/or micronodular opacities at the LEFT lung base, suspicious for interstitial edema and/or pneumonia. Electronically Signed   By: Franki Cabot M.D.   On: 03/28/2018 20:19   Ct Renal Stone Study  Result Date: 03/28/2018 CLINICAL DATA:  Patient found unresponsive. Elevated respiratory rate, heart rate and fever. Decreased level of consciousness. EXAM: CT ABDOMEN AND PELVIS WITHOUT CONTRAST TECHNIQUE: Multidetector CT imaging of the abdomen and pelvis was performed following the standard protocol without IV contrast. COMPARISON:  Abdominal radiograph performed earlier today at 12:16 p.m., and CT of the abdomen and pelvis performed 04/28/2015 FINDINGS: Lower chest: Minimal bibasilar atelectasis is noted. The visualized portions of the mediastinum are unremarkable. Hepatobiliary: The liver is unremarkable in appearance. The gallbladder is unremarkable in appearance. The common bile duct remains normal in caliber. Pancreas: The pancreas is within normal limits. Spleen: The spleen is unremarkable in appearance. Adrenals/Urinary Tract: The adrenal glands are unremarkable in appearance. Scattered stones noted at the right renal pelvis and renal calyces, measuring up to 6 mm in size. Associated scattered air is noted at the right renal pelvis and right renal calyces. Would correlate  clinically for evidence of emphysematous pyelonephritis. There appears to be an obstructing 5 mm stone at the distal right ureter, just above the right vesicoureteral junction, with mild right-sided hydronephrosis. Diffuse right-sided perinephric stranding and fluid are seen. Bilateral renal cysts are noted. A 2.4 cm isodense lesion is  noted arising at the lower pole of the left kidney, increased in size from 2016. Malignancy cannot be excluded. Stomach/Bowel: The stomach is unremarkable in appearance. The small bowel is within normal limits. The appendix is normal in caliber, without evidence of appendicitis. Scattered diverticulosis is noted along the descending colon, without evidence of diverticulitis. Vascular/Lymphatic: Minimal calcification is seen along the abdominal aorta. No retroperitoneal or pelvic sidewall lymphadenopathy is seen. Reproductive: The bladder is decompressed, with a Foley catheter in place. Diffuse bladder wall thickening may reflect chronic inflammation. The uterus is grossly unremarkable in appearance. No suspicious adnexal masses are seen. Other: No additional soft tissue abnormalities are seen. Musculoskeletal: No acute osseous abnormalities are identified. Mild multilevel vacuum phenomenon is noted along the lumbar spine. The visualized musculature is unremarkable in appearance. IMPRESSION: 1. Scattered stones at the right renal pelvis and renal calyces, measuring up to 6 mm in size. Associated scattered air noted at the right renal pelvis and right renal calyces. Diffuse right-sided perinephric stranding and fluid. Would correlate clinically for evidence of emphysematous pyelonephritis. 2. Obstructing 5 mm stone at the distal right ureter, just above the right vesicoureteral junction, with mild right-sided hydronephrosis. 3. 2.4 cm isodense lesion arising at the lower pole of the left kidney, increased in size from 2016. The malignancy cannot be excluded. Contrast-enhanced MRI or CT  would be helpful for further evaluation, when and as deemed clinically appropriate. 4. Diffuse bladder wall thickening may reflect chronic inflammation. 5. Scattered diverticulosis along the descending colon, without evidence of diverticulitis. Electronically Signed   By: Garald Balding M.D.   On: 03/28/2018 22:07    ------  Assessment:  67 y.o. 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   Concern for sepsis with right obstructing stone. Discussed PCN vs stent placement. Will proceed with emergent stent placement. Discussed risk with son - HCPOA - and sister (including but not limited to bleeding, worsening infection, damage to surrounding structures, inability to extubate post op). Of note, patient has hx of MSA -C, discussed how this might effect her post anesthesia recovery.   Will ask for hospitalist assistance with admission post op given multiple medical problems compounded by sepsis   Recommendations: - NPO  - Continue broad spectrum abx  - Emergent Right stent placement  - Admit to hospitalist post op    Thank you for this consult. Please contact the urology consult pager with any further questions/concerns.

## 2018-03-28 NOTE — Anesthesia Preprocedure Evaluation (Addendum)
Anesthesia Evaluation  Patient identified by MRN, date of birth, ID band Patient confused    Reviewed: Allergy & Precautions, NPO status , Patient's Chart, lab work & pertinent test results  Airway Mallampati: II  TM Distance: >3 FB Neck ROM: Full  Mouth opening: Limited Mouth Opening  Dental no notable dental hx. (+) Teeth Intact, Dental Advisory Given   Pulmonary shortness of breath, sleep apnea and Continuous Positive Airway Pressure Ventilation ,    Pulmonary exam normal breath sounds clear to auscultation       Cardiovascular hypertension, Pt. on medications Normal cardiovascular exam Rhythm:Regular Rate:Normal  Wheelchair bound   Neuro/Psych Depression Acute MS Changes upon admission at 2030 probably secondary to SepsisProgressive Supranuclear Palsy Parkinsons  Neuromuscular disease    GI/Hepatic Neg liver ROS,   Endo/Other  diabetes, Type 2Morbid obesity  Renal/GU negative Renal ROS     Musculoskeletal   Abdominal (+) + obese,   Peds  Hematology   Anesthesia Other Findings ALL: Naproxen, Zocor  Reproductive/Obstetrics                            Lab Results  Component Value Date   CREATININE 1.30 (H) 03/28/2018   BUN 22 (H) 03/28/2018   NA 138 03/28/2018   K 3.1 (L) 03/28/2018   CL 103 03/28/2018   CO2 19 (L) 03/28/2018   Lab Results  Component Value Date   WBC 2.6 (L) 03/28/2018   HGB 12.7 03/28/2018   HCT 39.2 03/28/2018   MCV 89.5 03/28/2018   PLT 142 (L) 03/28/2018     Anesthesia Physical Anesthesia Plan  ASA: IV and emergent  Anesthesia Plan: General   Post-op Pain Management:    Induction: Intravenous  PONV Risk Score and Plan: 3 and Treatment may vary due to age or medical condition and Ondansetron  Airway Management Planned: Oral ETT  Additional Equipment:   Intra-op Plan:   Post-operative Plan: Possible Post-op intubation/ventilation and Extubation  in OR  Informed Consent: I have reviewed the patients History and Physical, chart, labs and discussed the procedure including the risks, benefits and alternatives for the proposed anesthesia with the patient or authorized representative who has indicated his/her understanding and acceptance.   Dental advisory given  Plan Discussed with: CRNA  Anesthesia Plan Comments:        Anesthesia Quick Evaluation

## 2018-03-28 NOTE — Plan of Care (Signed)
Discussed patient with Dr. Tyrone Nine currently patient has been taken to OR emergently appears to be septic with falling blood pressure lactic acid up to 6.  If patient requires pressor support and or intubated after procedure please consult PCCM for admission  Amandalynn Pitz 11:34 PM

## 2018-03-28 NOTE — ED Triage Notes (Signed)
Pt is from [facility] and was seen by her PCP. Pt was dx with UTI and given a rx for Cipro. Per family pt was unresponsive once returning home after PCP office visit. Pt presents with elevated RR, HR, Temp, decreased LOC.

## 2018-03-29 ENCOUNTER — Encounter (HOSPITAL_COMMUNITY): Payer: Self-pay | Admitting: Urology

## 2018-03-29 ENCOUNTER — Inpatient Hospital Stay (HOSPITAL_COMMUNITY): Payer: PPO

## 2018-03-29 ENCOUNTER — Emergency Department (HOSPITAL_COMMUNITY): Payer: PPO | Admitting: Anesthesiology

## 2018-03-29 DIAGNOSIS — K219 Gastro-esophageal reflux disease without esophagitis: Secondary | ICD-10-CM | POA: Diagnosis present

## 2018-03-29 DIAGNOSIS — R7989 Other specified abnormal findings of blood chemistry: Secondary | ICD-10-CM | POA: Diagnosis not present

## 2018-03-29 DIAGNOSIS — N201 Calculus of ureter: Secondary | ICD-10-CM | POA: Diagnosis not present

## 2018-03-29 DIAGNOSIS — G9341 Metabolic encephalopathy: Secondary | ICD-10-CM | POA: Diagnosis present

## 2018-03-29 DIAGNOSIS — N2889 Other specified disorders of kidney and ureter: Secondary | ICD-10-CM | POA: Diagnosis present

## 2018-03-29 DIAGNOSIS — R748 Abnormal levels of other serum enzymes: Secondary | ICD-10-CM

## 2018-03-29 DIAGNOSIS — Z9989 Dependence on other enabling machines and devices: Secondary | ICD-10-CM | POA: Diagnosis not present

## 2018-03-29 DIAGNOSIS — N200 Calculus of kidney: Secondary | ICD-10-CM | POA: Diagnosis not present

## 2018-03-29 DIAGNOSIS — R6520 Severe sepsis without septic shock: Secondary | ICD-10-CM | POA: Diagnosis not present

## 2018-03-29 DIAGNOSIS — R001 Bradycardia, unspecified: Secondary | ICD-10-CM | POA: Diagnosis not present

## 2018-03-29 DIAGNOSIS — G231 Progressive supranuclear ophthalmoplegia [Steele-Richardson-Olszewski]: Secondary | ICD-10-CM | POA: Diagnosis present

## 2018-03-29 DIAGNOSIS — I5031 Acute diastolic (congestive) heart failure: Secondary | ICD-10-CM | POA: Diagnosis not present

## 2018-03-29 DIAGNOSIS — A419 Sepsis, unspecified organism: Principal | ICD-10-CM

## 2018-03-29 DIAGNOSIS — R6521 Severe sepsis with septic shock: Secondary | ICD-10-CM | POA: Diagnosis present

## 2018-03-29 DIAGNOSIS — E785 Hyperlipidemia, unspecified: Secondary | ICD-10-CM | POA: Diagnosis present

## 2018-03-29 DIAGNOSIS — E876 Hypokalemia: Secondary | ICD-10-CM

## 2018-03-29 DIAGNOSIS — E1165 Type 2 diabetes mellitus with hyperglycemia: Secondary | ICD-10-CM | POA: Diagnosis not present

## 2018-03-29 DIAGNOSIS — I351 Nonrheumatic aortic (valve) insufficiency: Secondary | ICD-10-CM | POA: Diagnosis not present

## 2018-03-29 DIAGNOSIS — I5033 Acute on chronic diastolic (congestive) heart failure: Secondary | ICD-10-CM | POA: Diagnosis not present

## 2018-03-29 DIAGNOSIS — G9340 Encephalopathy, unspecified: Secondary | ICD-10-CM | POA: Diagnosis not present

## 2018-03-29 DIAGNOSIS — G4733 Obstructive sleep apnea (adult) (pediatric): Secondary | ICD-10-CM

## 2018-03-29 DIAGNOSIS — K5730 Diverticulosis of large intestine without perforation or abscess without bleeding: Secondary | ICD-10-CM | POA: Diagnosis present

## 2018-03-29 DIAGNOSIS — N393 Stress incontinence (female) (male): Secondary | ICD-10-CM | POA: Diagnosis present

## 2018-03-29 DIAGNOSIS — N12 Tubulo-interstitial nephritis, not specified as acute or chronic: Secondary | ICD-10-CM | POA: Diagnosis not present

## 2018-03-29 DIAGNOSIS — R4182 Altered mental status, unspecified: Secondary | ICD-10-CM | POA: Diagnosis present

## 2018-03-29 DIAGNOSIS — J9601 Acute respiratory failure with hypoxia: Secondary | ICD-10-CM | POA: Diagnosis not present

## 2018-03-29 DIAGNOSIS — E861 Hypovolemia: Secondary | ICD-10-CM | POA: Diagnosis present

## 2018-03-29 DIAGNOSIS — G903 Multi-system degeneration of the autonomic nervous system: Secondary | ICD-10-CM | POA: Diagnosis present

## 2018-03-29 DIAGNOSIS — E1142 Type 2 diabetes mellitus with diabetic polyneuropathy: Secondary | ICD-10-CM | POA: Diagnosis present

## 2018-03-29 DIAGNOSIS — N10 Acute tubulo-interstitial nephritis: Secondary | ICD-10-CM | POA: Diagnosis not present

## 2018-03-29 DIAGNOSIS — E2749 Other adrenocortical insufficiency: Secondary | ICD-10-CM | POA: Diagnosis present

## 2018-03-29 DIAGNOSIS — N179 Acute kidney failure, unspecified: Secondary | ICD-10-CM | POA: Diagnosis present

## 2018-03-29 DIAGNOSIS — I110 Hypertensive heart disease with heart failure: Secondary | ICD-10-CM | POA: Diagnosis present

## 2018-03-29 DIAGNOSIS — I248 Other forms of acute ischemic heart disease: Secondary | ICD-10-CM | POA: Diagnosis present

## 2018-03-29 DIAGNOSIS — N211 Calculus in urethra: Secondary | ICD-10-CM | POA: Diagnosis present

## 2018-03-29 DIAGNOSIS — N136 Pyonephrosis: Secondary | ICD-10-CM | POA: Diagnosis present

## 2018-03-29 DIAGNOSIS — E872 Acidosis: Secondary | ICD-10-CM | POA: Diagnosis present

## 2018-03-29 DIAGNOSIS — Z66 Do not resuscitate: Secondary | ICD-10-CM | POA: Diagnosis present

## 2018-03-29 LAB — GLUCOSE, CAPILLARY
GLUCOSE-CAPILLARY: 125 mg/dL — AB (ref 65–99)
GLUCOSE-CAPILLARY: 197 mg/dL — AB (ref 65–99)
GLUCOSE-CAPILLARY: 220 mg/dL — AB (ref 65–99)
Glucose-Capillary: 149 mg/dL — ABNORMAL HIGH (ref 65–99)
Glucose-Capillary: 263 mg/dL — ABNORMAL HIGH (ref 65–99)

## 2018-03-29 LAB — BLOOD GAS, ARTERIAL
Acid-base deficit: 8.1 mmol/L — ABNORMAL HIGH (ref 0.0–2.0)
Acid-base deficit: 5.7 mmol/L — ABNORMAL HIGH (ref 0.0–2.0)
Bicarbonate: 18.1 mmol/L — ABNORMAL LOW (ref 20.0–28.0)
Bicarbonate: 18.5 mmol/L — ABNORMAL LOW (ref 20.0–28.0)
Drawn by: 308601
Drawn by: 331471
FIO2: 100
FIO2: 40
MECHVT: 460 mL
O2 Saturation: 99.4 %
O2 Saturation: 98.3 %
PATIENT TEMPERATURE: 98.6
PEEP/CPAP: 5 cmH2O
PEEP: 5 cmH2O
PH ART: 7.359 (ref 7.350–7.450)
PO2 ART: 324 mmHg — AB (ref 83.0–108.0)
Patient temperature: 98.6
RATE: 14 resp/min
RATE: 24 resp/min
VT: 460 mL
pCO2 arterial: 41.5 mmHg (ref 32.0–48.0)
pCO2 arterial: 33.6 mmHg (ref 32.0–48.0)
pH, Arterial: 7.261 — ABNORMAL LOW (ref 7.350–7.450)
pO2, Arterial: 119 mmHg — ABNORMAL HIGH (ref 83.0–108.0)

## 2018-03-29 LAB — LACTIC ACID, PLASMA
LACTIC ACID, VENOUS: 3 mmol/L — AB (ref 0.5–1.9)
LACTIC ACID, VENOUS: 2.6 mmol/L — AB (ref 0.5–1.9)
Lactic Acid, Venous: 3.7 mmol/L (ref 0.5–1.9)
Lactic Acid, Venous: 2.9 mmol/L (ref 0.5–1.9)

## 2018-03-29 LAB — CBC
HCT: 38.4 % (ref 36.0–46.0)
Hemoglobin: 12.5 g/dL (ref 12.0–15.0)
MCH: 29.3 pg (ref 26.0–34.0)
MCHC: 32.6 g/dL (ref 30.0–36.0)
MCV: 90.1 fL (ref 78.0–100.0)
Platelets: 165 10*3/uL (ref 150–400)
RBC: 4.26 MIL/uL (ref 3.87–5.11)
RDW: 14.4 % (ref 11.5–15.5)
WBC: 23.6 10*3/uL — AB (ref 4.0–10.5)

## 2018-03-29 LAB — URINALYSIS, ROUTINE W REFLEX MICROSCOPIC
BILIRUBIN URINE: NEGATIVE
Glucose, UA: NEGATIVE mg/dL
Ketones, ur: NEGATIVE mg/dL
NITRITE: NEGATIVE
PROTEIN: 100 mg/dL — AB
RBC / HPF: >50 RBC/hpf — ABNORMAL HIGH (ref 0–5)
Specific Gravity, Urine: 1.009 (ref 1.005–1.030)
pH: 6 (ref 5.0–8.0)

## 2018-03-29 LAB — BASIC METABOLIC PANEL
ANION GAP: 13 (ref 5–15)
BUN: 24 mg/dL — ABNORMAL HIGH (ref 6–20)
CALCIUM: 8 mg/dL — AB (ref 8.9–10.3)
CO2: 19 mmol/L — ABNORMAL LOW (ref 22–32)
CREATININE: 1.78 mg/dL — AB (ref 0.44–1.00)
Chloride: 107 mmol/L (ref 101–111)
GFR calc non Af Amer: 29 mL/min — ABNORMAL LOW (ref >60)
GFR, EST AFRICAN AMERICAN: 33 mL/min — AB (ref >60)
GLUCOSE: 145 mg/dL — AB (ref 65–99)
Potassium: 3.1 mmol/L — ABNORMAL LOW (ref 3.5–5.1)
Sodium: 139 mmol/L (ref 135–145)

## 2018-03-29 LAB — TROPONIN I
TROPONIN I: 0.97 ng/mL — AB (ref <0.03)
TROPONIN I: 0.76 ng/mL — AB (ref <0.03)
Troponin I: 0.65 ng/mL (ref <0.03)
Troponin I: 0.92 ng/mL (ref <0.03)
Troponin I: 0.83 ng/mL (ref <0.03)

## 2018-03-29 LAB — CORTISOL: Cortisol, Plasma: 51.4 ug/dL

## 2018-03-29 LAB — PHOSPHORUS
PHOSPHORUS: 2.5 mg/dL (ref 2.5–4.6)
Phosphorus: 2.6 mg/dL (ref 2.5–4.6)
Phosphorus: 3.9 mg/dL (ref 2.5–4.6)
Phosphorus: 3.6 mg/dL (ref 2.5–4.6)

## 2018-03-29 LAB — MRSA PCR SCREENING: MRSA BY PCR: NEGATIVE

## 2018-03-29 LAB — MAGNESIUM
MAGNESIUM: 1.2 mg/dL — AB (ref 1.7–2.4)
MAGNESIUM: 2.2 mg/dL (ref 1.7–2.4)
Magnesium: 1.2 mg/dL — ABNORMAL LOW (ref 1.7–2.4)
Magnesium: 2.2 mg/dL (ref 1.7–2.4)

## 2018-03-29 LAB — PROCALCITONIN: Procalcitonin: >150 ng/mL

## 2018-03-29 MED ORDER — ORAL CARE MOUTH RINSE
15.0000 mL | Freq: Four times a day (QID) | OROMUCOSAL | Status: DC
  Administered 2018-03-29 – 2018-03-30 (×7): 15 mL via OROMUCOSAL

## 2018-03-29 MED ORDER — PRO-STAT SUGAR FREE PO LIQD
30.0000 mL | Freq: Two times a day (BID) | ORAL | Status: DC
  Administered 2018-03-29 – 2018-03-30 (×2): 30 mL
  Filled 2018-03-29 (×2): qty 30

## 2018-03-29 MED ORDER — ACETAMINOPHEN 10 MG/ML IV SOLN
INTRAVENOUS | Status: AC
  Filled 2018-03-29: qty 100

## 2018-03-29 MED ORDER — FENTANYL CITRATE (PF) 100 MCG/2ML IJ SOLN
INTRAMUSCULAR | Status: DC | PRN
  Administered 2018-03-29 (×2): 50 ug via INTRAVENOUS

## 2018-03-29 MED ORDER — SUGAMMADEX SODIUM 200 MG/2ML IV SOLN
INTRAVENOUS | Status: AC
  Filled 2018-03-29: qty 2

## 2018-03-29 MED ORDER — SUCCINYLCHOLINE CHLORIDE 20 MG/ML IJ SOLN: 100.0000 mg | Freq: Once | INTRAMUSCULAR | Status: DC

## 2018-03-29 MED ORDER — HYDROCODONE-ACETAMINOPHEN 7.5-325 MG PO TABS: 1.0000 | ORAL_TABLET | Freq: Once | ORAL | Status: DC | PRN

## 2018-03-29 MED ORDER — ONDANSETRON HCL 4 MG/2ML IJ SOLN: 4.0000 mg | Freq: Four times a day (QID) | INTRAMUSCULAR | Status: DC | PRN

## 2018-03-29 MED ORDER — SODIUM CHLORIDE 0.9 % IV SOLN
0.0000 ug/min | INTRAVENOUS | Status: DC
  Administered 2018-03-29: 400 ug/min via INTRAVENOUS
  Filled 2018-03-29: qty 40

## 2018-03-29 MED ORDER — SODIUM BICARBONATE 8.4 % IV SOLN
100.0000 meq | Freq: Once | INTRAVENOUS | Status: AC
  Administered 2018-03-29: 100 meq via INTRAVENOUS
  Filled 2018-03-29: qty 50
  Filled 2018-03-29: qty 100

## 2018-03-29 MED ORDER — VASOPRESSIN 20 UNIT/ML IV SOLN
0.0400 [IU]/min | INTRAVENOUS | Status: DC
  Administered 2018-03-29 (×2): 0.04 [IU]/min via INTRAVENOUS
  Filled 2018-03-29 (×3): qty 2

## 2018-03-29 MED ORDER — FAMOTIDINE IN NACL 20-0.9 MG/50ML-% IV SOLN
20.0000 mg | Freq: Two times a day (BID) | INTRAVENOUS | Status: DC
  Administered 2018-03-29 (×2): 20 mg via INTRAVENOUS
  Filled 2018-03-29 (×2): qty 50

## 2018-03-29 MED ORDER — VITAL HIGH PROTEIN PO LIQD
1000.0000 mL | ORAL | Status: DC
  Administered 2018-03-29: 1000 mL

## 2018-03-29 MED ORDER — LACTATED RINGERS IV SOLN
INTRAVENOUS | Status: DC | PRN
  Administered 2018-03-28 – 2018-03-29 (×2): via INTRAVENOUS

## 2018-03-29 MED ORDER — FENTANYL 2500MCG IN NS 250ML (10MCG/ML) PREMIX INFUSION
25.0000 ug/h | INTRAVENOUS | Status: DC
  Administered 2018-03-29: 150 ug/h via INTRAVENOUS
  Administered 2018-03-29: 250 ug/h via INTRAVENOUS
  Administered 2018-03-30: 150 ug/h via INTRAVENOUS
  Filled 2018-03-29 (×3): qty 250

## 2018-03-29 MED ORDER — ACETAMINOPHEN 325 MG PO TABS: 650.0000 mg | ORAL_TABLET | Freq: Four times a day (QID) | ORAL | Status: DC | PRN

## 2018-03-29 MED ORDER — SODIUM CHLORIDE 0.9 % IV SOLN
INTRAVENOUS | Status: DC
  Administered 2018-03-29: 05:00:00 via INTRAVENOUS

## 2018-03-29 MED ORDER — SODIUM CHLORIDE 0.9 % IV SOLN
INTRAVENOUS | Status: DC | PRN
  Administered 2018-03-28: via INTRAVENOUS

## 2018-03-29 MED ORDER — DOCUSATE SODIUM 50 MG/5ML PO LIQD
100.0000 mg | Freq: Two times a day (BID) | ORAL | Status: DC
  Administered 2018-03-29 – 2018-03-30 (×3): 100 mg
  Filled 2018-03-29 (×3): qty 10

## 2018-03-29 MED ORDER — FENTANYL BOLUS VIA INFUSION
25.0000 ug | INTRAVENOUS | Status: DC | PRN
  Administered 2018-03-29 – 2018-03-30 (×5): 25 ug via INTRAVENOUS
  Filled 2018-03-29: qty 25

## 2018-03-29 MED ORDER — SODIUM CHLORIDE 0.9 % IV BOLUS
1000.0000 mL | Freq: Once | INTRAVENOUS | Status: AC
  Administered 2018-03-29: 1000 mL via INTRAVENOUS

## 2018-03-29 MED ORDER — CHLORHEXIDINE GLUCONATE 0.12% ORAL RINSE (MEDLINE KIT)
15.0000 mL | Freq: Two times a day (BID) | OROMUCOSAL | Status: DC
  Administered 2018-03-29 – 2018-03-30 (×3): 15 mL via OROMUCOSAL

## 2018-03-29 MED ORDER — SUGAMMADEX SODIUM 200 MG/2ML IV SOLN
INTRAVENOUS | Status: DC | PRN
  Administered 2018-03-29: 200 mg via INTRAVENOUS

## 2018-03-29 MED ORDER — ETOMIDATE 2 MG/ML IV SOLN: 30.0000 mg | Freq: Once | INTRAVENOUS | Status: DC

## 2018-03-29 MED ORDER — PIPERACILLIN-TAZOBACTAM 3.375 G IVPB
3.3750 g | Freq: Three times a day (TID) | INTRAVENOUS | Status: DC
  Administered 2018-03-29 – 2018-04-01 (×11): 3.375 g via INTRAVENOUS
  Filled 2018-03-29 (×11): qty 50

## 2018-03-29 MED ORDER — PHENYLEPHRINE HCL-NACL 10-0.9 MG/250ML-% IV SOLN
0.0000 ug/min | INTRAVENOUS | Status: DC
  Administered 2018-03-29: 400 ug/min via INTRAVENOUS
  Filled 2018-03-29 (×2): qty 250

## 2018-03-29 MED ORDER — LACTATED RINGERS IV SOLN
INTRAVENOUS | Status: DC | PRN
  Administered 2018-03-29: via INTRAVENOUS

## 2018-03-29 MED ORDER — MIDAZOLAM HCL 2 MG/2ML IJ SOLN: 1.0000 mg | INTRAMUSCULAR | Status: DC | PRN

## 2018-03-29 MED ORDER — ACETAMINOPHEN 10 MG/ML IV SOLN
1000.0000 mg | Freq: Four times a day (QID) | INTRAVENOUS | Status: DC
  Administered 2018-03-29: 1000 mg via INTRAVENOUS
  Filled 2018-03-29 (×2): qty 100

## 2018-03-29 MED ORDER — NOREPINEPHRINE BITARTRATE 1 MG/ML IV SOLN
0.0000 ug/min | INTRAVENOUS | Status: DC
  Administered 2018-03-29: 9 ug/min via INTRAVENOUS
  Administered 2018-03-29: 15 ug/min via INTRAVENOUS
  Administered 2018-03-29: 5 ug/min via INTRAVENOUS
  Administered 2018-03-30: 9 ug/min via INTRAVENOUS
  Filled 2018-03-29 (×6): qty 4

## 2018-03-29 MED ORDER — ACETAMINOPHEN 650 MG RE SUPP
650.0000 mg | Freq: Four times a day (QID) | RECTAL | Status: DC | PRN
  Filled 2018-03-29: qty 1

## 2018-03-29 MED ORDER — ONDANSETRON HCL 4 MG PO TABS
4.0000 mg | ORAL_TABLET | Freq: Four times a day (QID) | ORAL | Status: DC | PRN
  Filled 2018-03-29: qty 1

## 2018-03-29 MED ORDER — ROCURONIUM BROMIDE 10 MG/ML (PF) SYRINGE
PREFILLED_SYRINGE | INTRAVENOUS | Status: DC | PRN
  Administered 2018-03-29: 4 mg via INTRAVENOUS

## 2018-03-29 MED ORDER — FENTANYL CITRATE (PF) 100 MCG/2ML IJ SOLN
50.0000 ug | Freq: Once | INTRAMUSCULAR | Status: AC
  Administered 2018-03-29: 50 ug via INTRAVENOUS

## 2018-03-29 MED ORDER — MIDAZOLAM HCL 2 MG/2ML IJ SOLN: 2.0000 mg | Freq: Once | INTRAMUSCULAR | Status: DC

## 2018-03-29 MED ORDER — INSULIN ASPART 100 UNIT/ML ~~LOC~~ SOLN
0.0000 [IU] | SUBCUTANEOUS | Status: DC
  Administered 2018-03-29: 3 [IU] via SUBCUTANEOUS
  Administered 2018-03-29: 2 [IU] via SUBCUTANEOUS
  Administered 2018-03-29: 1 [IU] via SUBCUTANEOUS
  Administered 2018-03-29: 3 [IU] via SUBCUTANEOUS
  Administered 2018-03-29: 1 [IU] via SUBCUTANEOUS

## 2018-03-29 MED ORDER — POTASSIUM CHLORIDE 10 MEQ/100ML IV SOLN
10.0000 meq | INTRAVENOUS | Status: DC
  Administered 2018-03-29 (×3): 10 meq via INTRAVENOUS
  Filled 2018-03-29 (×3): qty 100

## 2018-03-29 MED ORDER — HYDROCORTISONE NA SUCCINATE PF 100 MG IJ SOLR
50.0000 mg | Freq: Four times a day (QID) | INTRAMUSCULAR | Status: DC
  Administered 2018-03-29 – 2018-03-30 (×6): 50 mg via INTRAVENOUS
  Filled 2018-03-29 (×7): qty 2

## 2018-03-29 MED ORDER — MAGNESIUM SULFATE 4 GM/100ML IV SOLN
4.0000 g | Freq: Once | INTRAVENOUS | Status: AC
  Administered 2018-03-29: 4 g via INTRAVENOUS
  Filled 2018-03-29: qty 100

## 2018-03-29 MED ORDER — ACETAMINOPHEN 10 MG/ML IV SOLN
1000.0000 mg | Freq: Once | INTRAVENOUS | Status: DC | PRN
  Administered 2018-03-29: 1000 mg via INTRAVENOUS

## 2018-03-29 MED ORDER — HYDROMORPHONE HCL 1 MG/ML IJ SOLN: 0.2500 mg | INTRAMUSCULAR | Status: DC | PRN

## 2018-03-29 MED ORDER — SODIUM CHLORIDE 0.9 % IV SOLN: 250.0000 mL | INTRAVENOUS | Status: DC | PRN

## 2018-03-29 MED ORDER — FENTANYL CITRATE (PF) 100 MCG/2ML IJ SOLN
INTRAMUSCULAR | Status: AC
  Administered 2018-03-29: 50 ug via INTRAVENOUS
  Filled 2018-03-29: qty 2

## 2018-03-29 MED ORDER — PANTOPRAZOLE SODIUM 40 MG PO PACK
40.0000 mg | PACK | ORAL | Status: DC
  Administered 2018-03-30: 40 mg

## 2018-03-29 MED ORDER — MIDAZOLAM HCL 2 MG/2ML IJ SOLN
INTRAMUSCULAR | Status: AC
  Filled 2018-03-29: qty 4

## 2018-03-29 MED ORDER — ACETAMINOPHEN 650 MG RE SUPP: 650.0000 mg | Freq: Four times a day (QID) | RECTAL | Status: DC | PRN

## 2018-03-29 MED ORDER — ACETAMINOPHEN 160 MG/5ML PO SOLN
650.0000 mg | Freq: Four times a day (QID) | ORAL | Status: DC | PRN
  Administered 2018-03-29: 650 mg
  Filled 2018-03-29: qty 20.3

## 2018-03-29 MED ORDER — ACETAMINOPHEN 10 MG/ML IV SOLN
1000.0000 mg | Freq: Four times a day (QID) | INTRAVENOUS | Status: DC | PRN
  Filled 2018-03-29: qty 100

## 2018-03-29 MED ORDER — SUCCINYLCHOLINE CHLORIDE 200 MG/10ML IV SOSY
PREFILLED_SYRINGE | INTRAVENOUS | Status: DC | PRN
  Administered 2018-03-29: 30 mg via INTRAVENOUS

## 2018-03-29 MED ORDER — ENOXAPARIN SODIUM 40 MG/0.4ML ~~LOC~~ SOLN
40.0000 mg | Freq: Every day | SUBCUTANEOUS | Status: DC
  Administered 2018-03-29 – 2018-04-08 (×11): 40 mg via SUBCUTANEOUS
  Filled 2018-03-29 (×11): qty 0.4

## 2018-03-29 MED ORDER — VANCOMYCIN HCL IN DEXTROSE 1-5 GM/200ML-% IV SOLN
1000.0000 mg | INTRAVENOUS | Status: DC
  Administered 2018-03-30: 1000 mg via INTRAVENOUS
  Filled 2018-03-29: qty 200

## 2018-03-29 MED ORDER — ETOMIDATE 2 MG/ML IV SOLN
INTRAVENOUS | Status: AC
  Filled 2018-03-29: qty 10

## 2018-03-29 MED ORDER — SODIUM BICARBONATE 8.4 % IV SOLN
INTRAVENOUS | Status: DC
  Administered 2018-03-29 – 2018-03-30 (×3): via INTRAVENOUS
  Filled 2018-03-29 (×4): qty 850

## 2018-03-29 MED ORDER — PROMETHAZINE HCL 25 MG/ML IJ SOLN: 6.2500 mg | INTRAMUSCULAR | Status: DC | PRN

## 2018-03-29 MED ORDER — LIDOCAINE 2% (20 MG/ML) 5 ML SYRINGE
INTRAMUSCULAR | Status: DC | PRN
  Administered 2018-03-29: 50 mg via INTRAVENOUS

## 2018-03-29 MED ORDER — FENTANYL CITRATE (PF) 100 MCG/2ML IJ SOLN: 50.0000 ug | Freq: Once | INTRAMUSCULAR | Status: DC

## 2018-03-29 MED ORDER — ETOMIDATE 2 MG/ML IV SOLN
INTRAVENOUS | Status: DC | PRN
  Administered 2018-03-29: 14 mg via INTRAVENOUS

## 2018-03-29 MED ORDER — MEPERIDINE HCL 50 MG/ML IJ SOLN: 6.2500 mg | INTRAMUSCULAR | Status: DC | PRN

## 2018-03-29 MED ORDER — PHENYLEPHRINE HCL 10 MG/ML IJ SOLN
INTRAMUSCULAR | Status: AC
  Filled 2018-03-29: qty 1

## 2018-03-29 MED ORDER — PHENYLEPHRINE HCL-NACL 10-0.9 MG/250ML-% IV SOLN
0.0000 ug/min | INTRAVENOUS | Status: DC
Start: 2018-03-29 — End: 2018-03-29
  Administered 2018-03-29: 30 ug/min via INTRAVENOUS
  Administered 2018-03-29: 150 ug/min via INTRAVENOUS

## 2018-03-29 MED ORDER — MIDAZOLAM HCL 2 MG/2ML IJ SOLN
INTRAMUSCULAR | Status: AC
  Filled 2018-03-29: qty 2

## 2018-03-29 MED ORDER — IOHEXOL 300 MG/ML  SOLN
INTRAMUSCULAR | Status: DC | PRN
  Administered 2018-03-29: 50 mL

## 2018-03-29 NOTE — Anesthesia Postprocedure Evaluation (Signed)
Anesthesia Post Note  Patient: [name] [name]  Procedure(s) Performed: CYSTOSCOPY WITH STENT PLACEMENT retrograde pylegram (Right Ureter)     Patient location during evaluation: ICU Anesthesia Type: General Level of consciousness: awake and alert Pain management: pain level controlled Vital Signs Assessment: post-procedure vital signs reviewed and stable Respiratory status: spontaneous breathing, nonlabored ventilation, respiratory function stable and patient connected to nasal cannula oxygen Cardiovascular status: blood pressure returned to baseline and stable Postop Assessment: no apparent nausea or vomiting Anesthetic complications: no    Last Vitals:  Vitals:   03/29/18 0130 03/29/18 0200  BP: 105/61 (!) 87/41  Pulse: (!) 108 (!) 105  Resp: (!) 29 (!) 28  Temp: 37.5 C (!) 39.3 C  SpO2: 98% 98%    Last Pain:  Vitals:   03/29/18 0130  TempSrc:   PainSc: 0-No pain                 Barnet Glasgow

## 2018-03-29 NOTE — Progress Notes (Signed)
CRITICAL VALUE ALERT  Critical Value:  Lactic Acid 3.0  Date & Time Notied:  Today, now  Provider Notified: Elink RN  Orders Received/Actions taken: pending

## 2018-03-29 NOTE — Transfer of Care (Signed)
Immediate Anesthesia Transfer of Care Note  Patient: [name] [name]  Procedure(s) Performed: CYSTOSCOPY WITH STENT PLACEMENT retrograde pylegram (Right Ureter)  Patient Location: PACU  Anesthesia Type:General  Level of Consciousness: awake, alert  and patient cooperative  Airway & Oxygen Therapy: Patient Spontanous Breathing and Patient connected to face mask oxygen  Post-op Assessment: Report given to RN, Post -op Vital signs reviewed and stable and Patient moving all extremities X 4  Post vital signs: stable  Last Vitals:  Vitals Value Taken Time  BP 95/57 03/29/2018  1:15 AM  Temp    Pulse 109 03/29/2018  1:16 AM  Resp 29 03/29/2018  1:16 AM  SpO2 97 % 03/29/2018  1:16 AM  Vitals shown include unvalidated device data.  Last Pain:  Vitals:   03/29/18 0110  TempSrc:   PainSc: 0-No pain         Complications: No apparent anesthesia complications

## 2018-03-29 NOTE — Op Note (Signed)
[name] [name] MEDICAL RECORD [id number] ACCOUNT [id number] DATE OF BIRTH:October 29, 1951 FACILITY: [facility] LOCATION: [facility] PHYSICIAN:Nisaiah Bechtol, MD  OPERATIVE REPORT  DATE OF PROCEDURE:  03/28/2017  PREOPERATIVE DIAGNOSIS:  Right ureteral stone with urosepsis.  PROCEDURE PERFORMED:  Cystoscopy with right retrograde pyelogram and interpretation of right ureteral stent placement.  ESTIMATED BLOOD LOSS:  Nil.  COMPLICATIONS:  None.  SPECIMENS:  Right renal pelvis urine for Gram stain and culture.  FINDINGS: 1.  Moderate to severe hydroureteronephrosis to a filling defect in the distal ureter consistent with known stone. 2.  Successful placement of right ureteral stent proximally in the renal pelvis, distal end in the bladder.  INDICATIONS:  The patient is a 67 year old lady with significant neurologic comorbidities including multisystem atrophy.  She was found on workup with increasing malaise, stupor and fevers to have a right distal ureteral stone by axial imaging.  Her  overall picture was concerning for obstructing ureteral stone and urosepsis.  It was clearly felt that urgent renal decompression was warranted.  Given the characteristics and location of the stone, it was felt that stenting would be most advantageous.   Discussed with the patient and her family.  She wished to proceed.  Informed consent was obtained and placed in medical record.  DESCRIPTION OF PROCEDURE:  The patient being identified, right procedure being right ureteral stent placement was confirmed.  Procedure timeout was performed.  IV antibiotics were verified.  General endotracheal anesthesia induced.  The patient was  placed into a low lithotomy position, sterilely scrubbed, prepped and draped at the base of the vagina, introitus and Bartholin's using iodine.  Next, cystourethroscopy was performed with a 22-French rigid cystoscope offset lens.  Inspection of the  bladder revealed no diverticula,  calcifications or papillary lesions.  Ureteral orifices appeared singleton bilaterally.  The right ureteral orifice was cannulated with a 5-French end-hole catheter and right retrograde pyelogram was obtained.  Right retrograde pyelogram demonstrated a single right ureter, single system right kidney.  There was significant tortuosity of the ureter and moderate hydronephrosis to the filling defect in the distal ureter consistent with known stone.  A 0.038 sensor  wire was advanced to the level of the mid pole, over which a new 6 x 24 contour-type stent was placed using cystoscopic and fluoroscopic guidance.  Good proximal and distal points were noted.  There was efflux of copious and proteinaceous and quite foul  urine seen around the distal end of the stent.  A sample of the fluid exiting the stent which represented the right renal pelvis was was sent for CX.  A 16-French temperature probe Foley was then placed per straight drain, and the procedure was terminated.  The  patient tolerated the procedure well and no immediate perineal complications.  The patient was taken to the postanesthesia care unit in stable condition with plan for ICU admission given her frank urosepsis.  LN/NUANCE  D:03/29/2018 T:03/29/2018 JOB:[id number]

## 2018-03-29 NOTE — Anesthesia Postprocedure Evaluation (Signed)
Anesthesia Post Note  Patient: [name] [name]  Procedure(s) Performed: CYSTOSCOPY WITH STENT PLACEMENT retrograde pylegram (Right Ureter)     Patient location during evaluation: PACU Anesthesia Type: General Level of consciousness: awake and alert Pain management: pain level controlled Vital Signs Assessment: post-procedure vital signs reviewed and stable Respiratory status: spontaneous breathing, nonlabored ventilation, respiratory function stable and patient connected to nasal cannula oxygen Cardiovascular status: blood pressure returned to baseline and stable Postop Assessment: no apparent nausea or vomiting Anesthetic complications: no    Last Vitals:  Vitals:   03/29/18 0130 03/29/18 0200  BP: 105/61 (!) 87/41  Pulse: (!) 108 (!) 105  Resp: (!) 29 (!) 28  Temp: 37.5 C (!) 39.3 C  SpO2: 98% 98%    Last Pain:  Vitals:   03/29/18 0130  TempSrc:   PainSc: 0-No pain                 Barnet Glasgow

## 2018-03-29 NOTE — Procedures (Signed)
Central Venous Catheter Insertion Procedure Note  Procedure: Insertion of Central Venous Catheter  Indications:  vascular access, hypovolemia  Procedure Details  Informed consent was obtained for the procedure, including sedation.  Risks of lung perforation, hemorrhage, arrhythmia, and adverse drug reaction were discussed.   Maximum sterile technique was used including antiseptics, cap, gloves, gown, hand hygiene, mask and sheet.  Under sterile conditions the skin above the on the right internal jugular vein was prepped with betadine and covered with a sterile drape. Local anesthesia was applied to the skin and subcutaneous tissues. A 22-gauge needle was used to identify the vein. An 18-gauge needle was then inserted into the vein. A guide wire was then passed easily through the catheter. There were no arrhythmias. The catheter was then withdrawn. A triple-lumen was then inserted into the vessel over the guide wire. The catheter was sutured into place.  Findings: There were no changes to vital signs. Catheter was flushed with 10 cc NS. Patient did tolerate procedure well.  Recommendations: CXR ordered to verify placement.

## 2018-03-29 NOTE — Consult Note (Signed)
PULMONARY / CRITICAL CARE MEDICINE   Name: [name] [name] MRN: [id number] DOB: 19-Jul-1951    ADMISSION DATE:  03/28/2018 CONSULTATION DATE: 03/29/18  REFERRING MD:  Dr Alcario Drought  CHIEF COMPLAINT: Septic shock  HISTORY OF PRESENT ILLNESS:   66yoF with hx DM, OSA, HTN, Depression, OA, Multiple system atrophy, and Nephrolithiasis, presented to the ER on 5/17 after she was found unresponsive by her family. Prior to being found unresponsive, she was seen by her PCP and diagnosed with a UTI. She was sent home with a prescription for ciprofloxacin. She took 1 dose of the cipro after which family came home and found her minimally responsive. EMS was called and found patient to be hypotensive and tachycardic. She was brought to the ER where she was found to have a temperature of 104 and was found to be in septic shock due to obstructive uropathy. CT Abdomen revealed pyelonephritis and a 38mm obstructive stone in the right ureter. She was taken to the OR where Urology placed a ureteral stent. She returned to the ICU and was requiring vasopressors, so PCCM was consulted.  At time of my initial exam, patient was minimally responsive but awakened to voice and following commands. She was febrile and tachypneic. She was confused and not capable of making her own decisions. I called her son who is her next of kin. He stated that she previously signed DNR paperwork because she "didn't want to live on life support indefinitely." However, he thinks she would want life support temporarily if needed. Therefore, he wishes to change her code status from DNR to [place]. I began setting up for central line placement and within the course of 10 minutes patient had become less responsive and more tachypneic. Decided to emergently intubate, following which central line was placed. See separate procedure notes.   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  PAST SURGICAL HISTORY: She  has a past surgical history that includes Tubal ligation (1985); Extracorporeal shock wave lithotripsy (Right, 08-18-2012); Cardiovascular stress test (09-30-2007  ); transthoracic echocardiogram (09-23-2007); Cystoscopy with retrograde pyelogram, ureteroscopy and stent placement (Right, 05/12/2015); Holmium laser application (Right, 05/18/2375); Colonoscopy; Anterior cervical decomp/discectomy fusion (N/A, 03/23/2016); ORIF humerus fracture (Right, 07/20/2016); and Shoulder arthroscopy (Right, 07/2016).  Allergies  Allergen Reactions  . Aleve [Naproxen] Hives, Shortness Of Breath and Rash    Pt Avoids All Nsaids  . Zocor [Simvastatin] Other (See Comments)    "weird feeling all over body"    No current facility-administered medications on file prior to encounter.    Current Outpatient Medications on File Prior to Encounter  Medication Sig  . aspirin 81 MG tablet Take 81 mg by mouth at bedtime.   Marland Kitchen azelastine (ASTELIN) 0.1 % nasal spray Place 1 spray into both nostrils 2 (two) times daily.   . carbidopa-levodopa (SINEMET IR) 25-100 MG tablet 2 tablets TID (Patient taking differently: Take 3 tablets by mouth at bedtime. 2 tablets TID)  . Cholecalciferol (VITAMIN D PO) Take 5,000 Units by mouth daily.  . ciprofloxacin (CIPRO) 500 MG tablet  Take 500 mg by mouth 2 (two) times daily.  . cyanocobalamin 500 MCG tablet Take 500 mcg by mouth daily.  Marland Kitchen escitalopram (LEXAPRO) 10 MG tablet Take 15  mg at bedtime by mouth.   . fenofibrate 160 MG tablet Take 160 mg by mouth every morning.   . fluticasone (FLONASE) 50 MCG/ACT nasal spray Place 2 sprays into both nostrils daily.   Marland Kitchen loratadine (CLARITIN) 10 MG tablet Take 10 mg by mouth. Taking every other day.  . Melatonin 5 MG CAPS Take 1 capsule by mouth at bedtime as needed. For sleep  . metFORMIN (GLUCOPHAGE) 500 MG tablet Take 250 mg by mouth 2 (two) times daily with a meal.   . montelukast (SINGULAIR) 10 MG tablet Take 10 mg by mouth at bedtime.  Marland Kitchen nystatin cream (MYCOSTATIN) Apply 1 application topically 2 (two) times daily as needed for dry skin.  Marland Kitchen olmesartan (BENICAR) 40 MG tablet Take 40 mg by mouth daily.  Marland Kitchen Propylene Glycol (SYSTANE BALANCE OP) Place 2 drops into both eyes 2 (two) times daily as needed (dry eyes).   . ranitidine (ZANTAC) 150 MG tablet Take 150 mg by mouth 2 (two) times daily.  . Menthol, Topical Analgesic, (BIOFREEZE) 4 % GEL Apply topically.   FAMILY HISTORY:  Her indicated that her mother is deceased. She indicated that her father is deceased. She indicated that only one of her four sisters is alive. She indicated that the status of her unknown relative is unknown.  SOCIAL HISTORY: She  reports that she has never smoked. She has never used smokeless tobacco. She reports that she drinks alcohol. She reports that she does not use drugs.  REVIEW OF SYSTEMS:   Review of Systems  Unable to perform ROS: Critical illness   SUBJECTIVE:  Intubated and sedated   VITAL SIGNS: BP (!) 87/41 Comment: [facility] MD notified.Waiting for response.  Pulse (!) 105   Temp (!) 102.7 F (39.3 C)   Resp (!) 28   Ht 5\' 5"  (1.651 m)   Wt 98.9 kg (218 lb)   SpO2 98%   BMI 36.28 kg/m   HEMODYNAMICS:  Neo @ 459mcg; Levophed @ 48mcg  VENTILATOR SETTINGS:  PRVC, 100% FIO2, 5 PEEP, RR 14.   INTAKE / OUTPUT: No intake/output data recorded.  PHYSICAL EXAMINATION: General: Obese adult female, well developed, critically  ill Neuro: PERRL, prior to intubation was somnolent but arousable to voice and obeying commands, was moving all extremities. Now after intubation she withdraws to pain and grimaces to sternal rub (with fentanyl infusion) HEENT: OP clear, MM moist, Orally intubated  Cardiovascular: Tachycardic with a regular rhythm, no m/r/g Lungs: CTA b/l Abdomen: Obese soft NTND Musculoskeletal: no LE edema Skin: no rashes  LABS:  BMET Recent Labs  Lab 03/28/18 2030 03/28/18 2041  NA 138 138  K 3.2* 3.1*  CL 102 103  CO2 19*  --   BUN 21* 22*  CREATININE 1.40* 1.30*  GLUCOSE 181* 181*   Electrolytes Recent Labs  Lab 03/28/18 2030  CALCIUM 9.2   CBC Recent Labs  Lab 03/28/18 2041 03/28/18 2052  WBC  --  2.6*  HGB 14.6 12.7  HCT 43.0 39.2  PLT  --  142*   Coag's No results for input(s): APTT, INR in the last 168 hours.  Sepsis Markers Recent Labs  Lab 03/28/18 2042 03/29/18 0203  LATICACIDVEN 6.24* 3.7*   ABG Recent Labs  Lab 03/28/18 2025  PHART 7.393  PCO2ART 28.9*  PO2ART 121*  Liver Enzymes Recent Labs  Lab 03/28/18 2030  AST 34  ALT 10*  ALKPHOS 111  BILITOT 1.3*  ALBUMIN 3.4*   Cardiac Enzymes Recent Labs  Lab 03/29/18 0203  TROPONINI 0.65*   Glucose Recent Labs  Lab 03/28/18 2014 03/29/18 0042  GLUCAP 172* 125*   Imaging Dg Abd 1 View  Result Date: 03/28/2018 CLINICAL DATA:  Right flank pain EXAM: ABDOMEN - 1 VIEW COMPARISON:  04/15/2017 FINDINGS: Negative for urinary tract calculi. Normal bowel gas pattern. Lumbar levoscoliosis with degenerative change. IMPRESSION: Negative for renal calculi. Electronically Signed   By: Franchot Gallo M.D.   On: 03/28/2018 16:33   Dg Chest Port 1 View  Result Date: 03/28/2018 CLINICAL DATA:  Sepsis, fever, short of breath EXAM: PORTABLE CHEST 1 VIEW COMPARISON:  Chest x-ray dated 12/19/2017. FINDINGS: New interstitial and/or micronodular opacities at the LEFT lung base, suspicious for edema or pneumonia.  No pleural effusion or pneumothorax seen. Heart size and mediastinal contours are stable. No acute or suspicious osseous finding. IMPRESSION: New subtle interstitial and/or micronodular opacities at the LEFT lung base, suspicious for interstitial edema and/or pneumonia. Electronically Signed   By: Franki Cabot M.D.   On: 03/28/2018 20:19   Dg C-arm 1-60 Min-no Report  Result Date: 03/29/2018 Fluoroscopy was utilized by the requesting physician.  No radiographic interpretation.   Ct Renal Stone Study  Result Date: 03/28/2018 CLINICAL DATA:  Patient found unresponsive. Elevated respiratory rate, heart rate and fever. Decreased level of consciousness. EXAM: CT ABDOMEN AND PELVIS WITHOUT CONTRAST TECHNIQUE: Multidetector CT imaging of the abdomen and pelvis was performed following the standard protocol without IV contrast. COMPARISON:  Abdominal radiograph performed earlier today at 12:16 p.m., and CT of the abdomen and pelvis performed 04/28/2015 FINDINGS: Lower chest: Minimal bibasilar atelectasis is noted. The visualized portions of the mediastinum are unremarkable. Hepatobiliary: The liver is unremarkable in appearance. The gallbladder is unremarkable in appearance. The common bile duct remains normal in caliber. Pancreas: The pancreas is within normal limits. Spleen: The spleen is unremarkable in appearance. Adrenals/Urinary Tract: The adrenal glands are unremarkable in appearance. Scattered stones noted at the right renal pelvis and renal calyces, measuring up to 6 mm in size. Associated scattered air is noted at the right renal pelvis and right renal calyces. Would correlate clinically for evidence of emphysematous pyelonephritis. There appears to be an obstructing 5 mm stone at the distal right ureter, just above the right vesicoureteral junction, with mild right-sided hydronephrosis. Diffuse right-sided perinephric stranding and fluid are seen. Bilateral renal cysts are noted. A 2.4 cm isodense lesion  is noted arising at the lower pole of the left kidney, increased in size from 2016. Malignancy cannot be excluded. Stomach/Bowel: The stomach is unremarkable in appearance. The small bowel is within normal limits. The appendix is normal in caliber, without evidence of appendicitis. Scattered diverticulosis is noted along the descending colon, without evidence of diverticulitis. Vascular/Lymphatic: Minimal calcification is seen along the abdominal aorta. No retroperitoneal or pelvic sidewall lymphadenopathy is seen. Reproductive: The bladder is decompressed, with a Foley catheter in place. Diffuse bladder wall thickening may reflect chronic inflammation. The uterus is grossly unremarkable in appearance. No suspicious adnexal masses are seen. Other: No additional soft tissue abnormalities are seen. Musculoskeletal: No acute osseous abnormalities are identified. Mild multilevel vacuum phenomenon is noted along the lumbar spine. The visualized musculature is unremarkable in appearance. IMPRESSION: 1. Scattered stones at the right renal pelvis and renal calyces, measuring up to 6 mm in size.  Associated scattered air noted at the right renal pelvis and right renal calyces. Diffuse right-sided perinephric stranding and fluid. Would correlate clinically for evidence of emphysematous pyelonephritis. 2. Obstructing 5 mm stone at the distal right ureter, just above the right vesicoureteral junction, with mild right-sided hydronephrosis. 3. 2.4 cm isodense lesion arising at the lower pole of the left kidney, increased in size from 2016. The malignancy cannot be excluded. Contrast-enhanced MRI or CT would be helpful for further evaluation, when and as deemed clinically appropriate. 4. Diffuse bladder wall thickening may reflect chronic inflammation. 5. Scattered diverticulosis along the descending colon, without evidence of diverticulitis. Electronically Signed   By: Garald Balding M.D.   On: 03/28/2018 22:07   STUDIES:  CT  Renal stone (03/28/18):  1. Scattered stones at the right renal pelvis and renal calyces, measuring up to 6 mm in size. Associated scattered air noted at the right renal pelvis and right renal calyces. Diffuse right-sided perinephric stranding and fluid. Would correlate clinically for evidence of emphysematous pyelonephritis. 2. Obstructing 5 mm stone at the distal right ureter, just above the right vesicoureteral junction, with mild right-sided hydronephrosis. 3. 2.4 cm isodense lesion arising at the lower pole of the left kidney, increased in size from 2016. The malignancy cannot be excluded. Contrast-enhanced MRI or CT would be helpful for further evaluation, when and as deemed clinically appropriate. 4. Diffuse bladder wall thickening may reflect chronic inflammation. 5. Scattered diverticulosis along the descending colon, without evidence of diverticulitis. CXR (03/29/18): pending   CULTURES: Blood cultures (5/17): pending Urine culture (5/17): pending  ANTIBIOTICS: Vancomycin 5/17 >> Zosyn 5/17 >>  SIGNIFICANT EVENTS: 5/17: presented to ER with AMS and Septic shock due to obstructive uropathy >> Urology placed Right ureteral stent 5/18: shock, AMS, and Work of breathing worsening >> intubated and placed central line  LINES/TUBES: RIJ TLC 5/18 >> ETT 5/18 >> OG tube 5/18 >> Foley catheter 5/17 >>  DISCUSSION: 6yoF with hx DM, OSA, HTN, Depression, OA, Multiple system atrophy, and Nephrolithiasis, presented to the ER on 5/17 with AMS and found to have Septic shock due to Obstructive uropathy with Pyelonephritis.  ASSESSMENT / PLAN:  PULMONARY 1. Acute Hypoxic Respiratory Failure - worsening hypoxia and work of breathing prior to intubation, likely related to the 8.5L IVF bolus she received (not all charted as some given intraoperatively).  - CXR post intubation on my review shows developing pulmonary edema compared to CXR taken on admission; ETT, OG tube, and Central line in  good positions.  - obtain ABG 1hr post intubation - no further IVF boluses at this time.   CARDIOVASCULAR 1. Shock; Hx HTN: - hold home antihypertensive medications given her current septic shock  RENAL 1. AKI; Hypokalemia; Nephrolithiasis; Pyelonephritis and Obstructive uropathy: - creatinine 1.40 on admission, up from baseline of 0.58 (in 2017) - foley catheter in place; monitor UOP closely. Avoid nephrotoxic agents - CT Abdomen showed pyelonephritis and obstructive stone in right ureter for which urology placed a ureteral stent.  - repleted potassium with 40MEQ IV  2. Left Kidney lesion: - CT showed 2.4cm lesion on left kidney that has increased in size since 2016 - will need outpatient follow-up for this.   GASTROINTESTINAL No active issues; NPO; GI prophylaxis  HEMATOLOGIC No active issues; DVT prophylaxis  INFECTIOUS 1. Septic Shock: due to pyelonephritis with obstructive uropathy: - s/p 8.5L IVF bolus (combination of what was given in ER and in OR) - placed central line; transitioned Neo to Levophed.  -  blood cultures and urine culture sent. Continue Vancomycin and Zosyn. - lactate 6.24; continue to trend. Check procalcitonin and cortisol.  - check CVP (IJ looked plump on ultrasound; likely distributive shock from her sepsis) - APAP and Cooling blanket PRN  ENDOCRINE 1. DM: - NPO; SSI  NEUROLOGIC 1. Acute Encephalopathy: - likely due to sepsis; no hypercapnea on ABG. Afocal neuro exam.    FAMILY  - Updates: updated patient's son via phone.  - Inter-disciplinary family meet or Palliative Care meeting due by: 04/04/18   60 minutes nonprocedural critical care time  Vernie Murders, MD  Pulmonary and [place] Pager: [phone]  03/29/2018, 3:59 AM

## 2018-03-29 NOTE — Progress Notes (Addendum)
Pharmacy Antibiotic Note  [name] [name] is a 67 y.o. female admitted on 03/28/2018 with intra-abdominal infection.  Pharmacy has been consulted for zosyn and vancomycin dosing. Patient is s/p cystoscopy with stent placement .   Plan: Zosyn 3.375g IV q8h (4 hour infusion).  Vancomycin 2 gm x1 then 1 Gm IV q36h for est AUC = 507 Goal AUC = 400-500 Daily Scr F/u cultures/levels  Height: 5' 0.5" (153.7 cm) Weight: 218 lb (98.9 kg) IBW/kg (Calculated) : 46.65  Temp (24hrs), Avg:101.6 F (38.7 C), Min:99.5 F (37.5 C), Max:103.7 F (39.8 C)  Recent Labs  Lab 03/28/18 2030 03/28/18 2041 03/28/18 2042 03/28/18 2052  WBC  --   --   --  2.6*  CREATININE 1.40* 1.30*  --   --   LATICACIDVEN  --   --  6.24*  --     Estimated Creatinine Clearance: 45.4 mL/min (A) (by C-G formula based on SCr of 1.3 mg/dL (H)).    Allergies  Allergen Reactions  . Aleve [Naproxen] Hives, Shortness Of Breath and Rash    Pt Avoids All Nsaids  . Zocor [Simvastatin] Other (See Comments)    "weird feeling all over body"    Antimicrobials this admission: 5/17 zosyn >>   5/17 vancomycin  >>   Dose adjustments this admission:   Microbiology results:  BCx:   UCx:    Sputum:    MRSA PCR:   Thank you for allowing pharmacy to be a part of this patient's care.  Janis, Sol 03/29/2018 1:56 AM

## 2018-03-29 NOTE — Progress Notes (Signed)
PULMONARY / CRITICAL CARE MEDICINE   Name: [name] [name] MRN: [id number] DOB: Jan 11, 1951    ADMISSION DATE:  03/28/2018 CONSULTATION DATE:  03/29/2018  REFERRING MD:  Dr. Alcario Drought  CHIEF COMPLAINT:  Altered mental status  HISTORY OF PRESENT ILLNESS:   67 yo female brought to ER with altered mental status after recent dx of UTI.  Found to have Rt distal ureteral stone with hydronephrosis and had stenting by urology.  PMHx of chronic back pain, depression, HTN, HLD, multiple system atrophy, OSA, DM with neuropathy, chronic cough, reflux.  SUBJECTIVE:  Remains on vent, pressors.  VITAL SIGNS: BP (!) 88/37   Pulse 75   Temp 99.1 F (37.3 C)   Resp (!) 24   Ht 5\' 5"  (1.651 m)   Wt 218 lb (98.9 kg)   SpO2 100%   BMI 36.28 kg/m   HEMODYNAMICS: CVP:  [18 mmHg] 18 mmHg  VENTILATOR SETTINGS: Vent Mode: PRVC FiO2 (%):  [40 %-100 %] 40 % Set Rate:  [14 bmp-24 bmp] 24 bmp Vt Set:  [460 mL] 460 mL PEEP:  [5 cmH20] 5 cmH20 Plateau Pressure:  [17 cmH20-19 cmH20] 19 cmH20  INTAKE / OUTPUT: I/O last 3 completed shifts: In: 8254.2 [I.V.:4954.2; IV Piggyback:3300] Out: 145 [Urine:140; Blood:5]  PHYSICAL EXAMINATION:  General - sedated Eyes - pupils reactive ENT - ETT in place Cardiac - regular, no murmur Chest - no wheeze, rales Abd - soft, non tender Ext - no edema Skin - no rashes Neuro - follows commands  LABS:  BMET Recent Labs  Lab 03/28/18 2030 03/28/18 2041 03/29/18 0500  NA 138 138 139  K 3.2* 3.1* 3.1*  CL 102 103 107  CO2 19*  --  19*  BUN 21* 22* 24*  CREATININE 1.40* 1.30* 1.78*  GLUCOSE 181* 181* 145*    Electrolytes Recent Labs  Lab 03/28/18 2030 03/29/18 0243 03/29/18 0500  CALCIUM 9.2  --  8.0*  MG  --  1.2* 1.2*  PHOS  --  2.5 2.6    CBC Recent Labs  Lab 03/28/18 2041 03/28/18 2052 03/29/18 0500  WBC  --  2.6* 23.6*  HGB 14.6 12.7 12.5  HCT 43.0 39.2 38.4  PLT  --  142* 165    Coag's No results for input(s): APTT, INR  in the last 168 hours.  Sepsis Markers Recent Labs  Lab 03/29/18 0203 03/29/18 0243 03/29/18 0428 03/29/18 0823  LATICACIDVEN 3.7*  --  3.0* 2.9*  PROCALCITON  --  >150.00  --   --     ABG Recent Labs  Lab 03/28/18 2025 03/29/18 0533 03/29/18 0800  PHART 7.393 7.261* 7.359  PCO2ART 28.9* 41.5 33.6  PO2ART 121* 324* 119*    Liver Enzymes Recent Labs  Lab 03/28/18 2030  AST 34  ALT 10*  ALKPHOS 111  BILITOT 1.3*  ALBUMIN 3.4*    Cardiac Enzymes Recent Labs  Lab 03/29/18 0203 03/29/18 0243 03/29/18 0823  TROPONINI 0.65* 0.97* 0.92*    Glucose Recent Labs  Lab 03/28/18 2014 03/29/18 0042  GLUCAP 172* 125*    Imaging Dg Chest 1 View  Result Date: 03/29/2018 CLINICAL DATA:  Endotracheal tube and central line placement. EXAM: CHEST  1 VIEW COMPARISON:  Chest radiograph performed 03/28/2018 FINDINGS: The patient's endotracheal tube is seen ending 1-2 cm above the carina. An enteric tube is noted extending below the diaphragm, with the side port at the distal esophagus. This could be advanced approximately 5 cm. A right IJ line is  noted ending about the distal SVC. The lungs are hypoexpanded. Increased interstitial markings may reflect mild interstitial edema. No definite pleural effusion or pneumothorax is seen. The cardiomediastinal silhouette is mildly enlarged. No acute osseous abnormalities are seen. The patient's right glenohumeral joint arthroplasty is partially characterized and appears grossly unremarkable. IMPRESSION: 1. Endotracheal tube seen ending 1 - 2 cm above the carina. 2. Enteric tube noted extending below the diaphragm, with the side port at the distal esophagus. This could be advanced approximately 5 cm, as deemed clinically appropriate. 3. Lungs hypoexpanded. Increased interstitial markings may reflect mild interstitial edema. 4. No pneumothorax. Electronically Signed   By: Garald Balding M.D.   On: 03/29/2018 05:22   Dg Abd 1 View  Result Date:  03/28/2018 CLINICAL DATA:  Right flank pain EXAM: ABDOMEN - 1 VIEW COMPARISON:  04/15/2017 FINDINGS: Negative for urinary tract calculi. Normal bowel gas pattern. Lumbar levoscoliosis with degenerative change. IMPRESSION: Negative for renal calculi. Electronically Signed   By: Franchot Gallo M.D.   On: 03/28/2018 16:33   Dg Chest Port 1 View  Result Date: 03/28/2018 CLINICAL DATA:  Sepsis, fever, short of breath EXAM: PORTABLE CHEST 1 VIEW COMPARISON:  Chest x-ray dated 12/19/2017. FINDINGS: New interstitial and/or micronodular opacities at the LEFT lung base, suspicious for edema or pneumonia. No pleural effusion or pneumothorax seen. Heart size and mediastinal contours are stable. No acute or suspicious osseous finding. IMPRESSION: New subtle interstitial and/or micronodular opacities at the LEFT lung base, suspicious for interstitial edema and/or pneumonia. Electronically Signed   By: Franki Cabot M.D.   On: 03/28/2018 20:19   Dg C-arm 1-60 Min-no Report  Result Date: 03/29/2018 Fluoroscopy was utilized by the requesting physician.  No radiographic interpretation.   Ct Renal Stone Study  Result Date: 03/28/2018 CLINICAL DATA:  Patient found unresponsive. Elevated respiratory rate, heart rate and fever. Decreased level of consciousness. EXAM: CT ABDOMEN AND PELVIS WITHOUT CONTRAST TECHNIQUE: Multidetector CT imaging of the abdomen and pelvis was performed following the standard protocol without IV contrast. COMPARISON:  Abdominal radiograph performed earlier today at 12:16 p.m., and CT of the abdomen and pelvis performed 04/28/2015 FINDINGS: Lower chest: Minimal bibasilar atelectasis is noted. The visualized portions of the mediastinum are unremarkable. Hepatobiliary: The liver is unremarkable in appearance. The gallbladder is unremarkable in appearance. The common bile duct remains normal in caliber. Pancreas: The pancreas is within normal limits. Spleen: The spleen is unremarkable in appearance.  Adrenals/Urinary Tract: The adrenal glands are unremarkable in appearance. Scattered stones noted at the right renal pelvis and renal calyces, measuring up to 6 mm in size. Associated scattered air is noted at the right renal pelvis and right renal calyces. Would correlate clinically for evidence of emphysematous pyelonephritis. There appears to be an obstructing 5 mm stone at the distal right ureter, just above the right vesicoureteral junction, with mild right-sided hydronephrosis. Diffuse right-sided perinephric stranding and fluid are seen. Bilateral renal cysts are noted. A 2.4 cm isodense lesion is noted arising at the lower pole of the left kidney, increased in size from 2016. Malignancy cannot be excluded. Stomach/Bowel: The stomach is unremarkable in appearance. The small bowel is within normal limits. The appendix is normal in caliber, without evidence of appendicitis. Scattered diverticulosis is noted along the descending colon, without evidence of diverticulitis. Vascular/Lymphatic: Minimal calcification is seen along the abdominal aorta. No retroperitoneal or pelvic sidewall lymphadenopathy is seen. Reproductive: The bladder is decompressed, with a Foley catheter in place. Diffuse bladder wall thickening may  reflect chronic inflammation. The uterus is grossly unremarkable in appearance. No suspicious adnexal masses are seen. Other: No additional soft tissue abnormalities are seen. Musculoskeletal: No acute osseous abnormalities are identified. Mild multilevel vacuum phenomenon is noted along the lumbar spine. The visualized musculature is unremarkable in appearance. IMPRESSION: 1. Scattered stones at the right renal pelvis and renal calyces, measuring up to 6 mm in size. Associated scattered air noted at the right renal pelvis and right renal calyces. Diffuse right-sided perinephric stranding and fluid. Would correlate clinically for evidence of emphysematous pyelonephritis. 2. Obstructing 5 mm stone at  the distal right ureter, just above the right vesicoureteral junction, with mild right-sided hydronephrosis. 3. 2.4 cm isodense lesion arising at the lower pole of the left kidney, increased in size from 2016. The malignancy cannot be excluded. Contrast-enhanced MRI or CT would be helpful for further evaluation, when and as deemed clinically appropriate. 4. Diffuse bladder wall thickening may reflect chronic inflammation. 5. Scattered diverticulosis along the descending colon, without evidence of diverticulitis. Electronically Signed   By: Garald Balding M.D.   On: 03/28/2018 22:07     STUDIES:  Renal stone study 5/17 >> Scattered stones at the right renal pelvis and renal calyces, diffuse right-sided perinephric stranding and fluid, obstructing 5 mm stone at the distal right ureter, just above the right vesicoureteral junction, with mild right-sided hydronephrosis, 2.4 cm isodense lesion arising at the lower pole of the left kidney, increased in size from 2016; malignancy cannot be excluded  CULTURES: Blood 5/17 >> Urine 5/17 >>   ANTIBIOTICS: Vancomycin 5/17 >> Zosyn 5/17 >>   SIGNIFICANT EVENTS: 5/17 Admit, cystoscopy with stent placement 5/18 septic shock, VDRF  LINES/TUBES: ETT 5/18 >> Rt IJ CVL 5/18 >>  DISCUSSION: 67 yo female with septic shock, VDRF from nephrolithiasis with hydronephrosis and UTI.   ASSESSMENT / PLAN:  Septic shock. - pressors to keep MAP > 65 - continue IV fluids  Acute respiratory failure with hypoxia. - full vent support  Nephrolithiasis with hydronephrosis causing UTI. - continue ABx  Acute renal failure from hydronephrosis. Hypokalemia, hypomagnesemia. Non gap metabolic acidosis. - f/u BMET - replace electrolytes - continue HCO3 in IV fluids  Acute metabolic encephalopathy. Multisystem atrophy. - RASS goal 0 to -1  Relative adrenal insufficiency. - continue solu cortef  DM. - SSI  DVT prophylaxis - lovenox SUP -  protonix Nutrition - tube feeds Goals of care - full code  CC time 36 minutes  Chesley Mires, MD [place] 03/29/2018, 11:00 AM

## 2018-03-29 NOTE — Progress Notes (Signed)
ABG shows metabolic acidosis. RT increased RR from 14 to 24 to help compensate. Will give 2 amps bicarb and change NS@ 75 to Bicarb infusion @ 100cc/hr. Repeat ABG at 8am.   Currently MAP 73 on 40mcg Levophed. Will add Vasopressin as 2nd pressor if needed. CVP 18.

## 2018-03-29 NOTE — Brief Op Note (Signed)
03/29/2018  12:27 AM  PATIENT:  [name] [name]  67 y.o. female  PRE-OPERATIVE DIAGNOSIS:  Stone  POST-OPERATIVE DIAGNOSIS:  stone  PROCEDURE:  Procedure(s): CYSTOSCOPY WITH STENT PLACEMENT retrograde pylegram (Right)  SURGEON:  Surgeon(s) and Role:    * Alexis Frock, MD - Primary  PHYSICIAN ASSISTANT:   ASSISTANTS: none   ANESTHESIA:   general  EBL:  minimal  BLOOD ADMINISTERED:none  DRAINS: Foley to gravity   LOCAL MEDICATIONS USED:  NONE  SPECIMEN:  Source of Specimen:  Rt renal pelvis urine for culture  DISPOSITION OF SPECIMEN:  microbiology  COUNTS:  YES  TOURNIQUET:  * No tourniquets in log *  DICTATION: .Other Dictation: Dictation Number [id number]  PLAN OF CARE: Admit to inpatient   PATIENT DISPOSITION:  PACU - guarded condition.   Delay start of Pharmacological VTE agent (>24hrs) due to surgical blood loss or risk of bleeding: not applicable

## 2018-03-29 NOTE — Progress Notes (Signed)
Per CXR report, OGT needing to be advanced roughly 5cm. This RN advanced roughly 5cm. OGT now hooked up to LIS.

## 2018-03-29 NOTE — Progress Notes (Signed)
CRITICAL VALUE ALERT  Critical Value: lactic acid 2.6  Date & Time Notied:  03/29/18 13:00 Provider Notified: Dr. Halford Chessman  Orders Received/Actions taken:No new orders currently

## 2018-03-29 NOTE — Addendum Note (Signed)
Addendum  created 03/29/18 0422 by Barnet Glasgow, MD   Sign clinical note

## 2018-03-29 NOTE — Progress Notes (Signed)
CRITICAL VALUE ALERT  Critical Value:  Lactic Acid 2.7  Date & Time Notied:  Today, @0247   Provider Notified: Elink RN  Orders Received/Actions taken: pending   CRITICAL VALUE ALERT  Critical Value:  Troponin 0.65  Date & Time Notied:  Today, @0257   Provider Notified: Elink RN  Orders Received/Actions taken: pending

## 2018-03-29 NOTE — Progress Notes (Signed)
CRITICAL VALUE ALERT  Critical Value:  Lactic acid 2.9, troponin 0.9  Date & Time Notied:  03/29/18 0930  Provider Notified: Dr. Halford Chessman  Orders Received/Actions taken: No new orders currently

## 2018-03-29 NOTE — Progress Notes (Signed)
For intubation, meds were given via verbal orders from Dr. Jimmey Ralph:  4mg  of versed  40mg  of rocc 30mg  of etomidate  40mg  of succ 100mg  of fentanyl

## 2018-03-29 NOTE — Procedures (Signed)
Endotracheal Intubation Procedure Note Indication for endotracheal intubation: impending respiratory failure Sedation: etomidate and midazolam Paralytic: succinylcholine Equipment: Macintosh 3 laryngoscope blade and 7.17mm cuffed endotracheal tube; Secured 23cm at the lip Cricoid Pressure: yes Number of attempts: 1 ETT location confirmed by by auscultation, by CXR and ETCO2 monitor.  While setting up for central line placement, patient developed increased work of breathing and was less responsive. Therefore emergently intubated patient. Patient tolerated procedure well.

## 2018-03-29 NOTE — Progress Notes (Signed)
Urology Progress Note   1 Day Post-Op  Subjective: Transferred to unit post op  Intubated post op  given increased work of breathing and less responsiveness during line placement  On pressors - vaso and Norepi  ( weaning this morning) , fent gtt On vanc/zosyn  Febrile to 101 overnight, pressures and HR stable this morning  Foley draining murky urine - ~200cc out since surgery WBC - 23.6, Cr - 1.76, Lactate 3  Objective: Vital signs in last 24 hours: Temp:  [99.1 F (37.3 C)-103.7 F (39.8 C)] 99.3 F (37.4 C) (05/18 0800) Pulse Rate:  [84-123] 84 (05/18 0800) Resp:  [13-32] 24 (05/18 0800) BP: (81-170)/(41-105) 140/82 (05/18 0800) SpO2:  [93 %-100 %] 100 % (05/18 0800) FiO2 (%):  [40 %-100 %] 40 % (05/18 0743) Weight:  [98.9 kg (218 lb)] 98.9 kg (218 lb) (05/17 2110)  Intake/Output from previous day: 05/17 0701 - 05/18 0700 In: 8154.2 [I.V.:4854.2; IV Piggyback:3300] Out: 145 [Urine:140; Blood:5] Intake/Output this shift: Total I/O In: -  Out: 50 [Urine:50]  Physical Exam:  General: Critically ill  CV: RRR Lungs: Intubated, mechanical breath sounds Abdomen: Soft, appropriately tender.  GU: Foley in place draining murky urine   Lab Results: Recent Labs    03/28/18 2041 03/28/18 2052 03/29/18 0500  HGB 14.6 12.7 12.5  HCT 43.0 39.2 38.4   BMET Recent Labs    03/28/18 2030 03/28/18 2041 03/29/18 0500  NA 138 138 139  K 3.2* 3.1* 3.1*  CL 102 103 107  CO2 19*  --  19*  GLUCOSE 181* 181* 145*  BUN 21* 22* 24*  CREATININE 1.40* 1.30* 1.78*  CALCIUM 9.2  --  8.0*     Studies/Results: Dg Chest 1 View  Result Date: 03/29/2018 CLINICAL DATA:  Endotracheal tube and central line placement. EXAM: CHEST  1 VIEW COMPARISON:  Chest radiograph performed 03/28/2018 FINDINGS: The patient's endotracheal tube is seen ending 1-2 cm above the carina. An enteric tube is noted extending below the diaphragm, with the side port at the distal esophagus. This could be  advanced approximately 5 cm. A right IJ line is noted ending about the distal SVC. The lungs are hypoexpanded. Increased interstitial markings may reflect mild interstitial edema. No definite pleural effusion or pneumothorax is seen. The cardiomediastinal silhouette is mildly enlarged. No acute osseous abnormalities are seen. The patient's right glenohumeral joint arthroplasty is partially characterized and appears grossly unremarkable. IMPRESSION: 1. Endotracheal tube seen ending 1 - 2 cm above the carina. 2. Enteric tube noted extending below the diaphragm, with the side port at the distal esophagus. This could be advanced approximately 5 cm, as deemed clinically appropriate. 3. Lungs hypoexpanded. Increased interstitial markings may reflect mild interstitial edema. 4. No pneumothorax. Electronically Signed   By: Garald Balding M.D.   On: 03/29/2018 05:22   Dg Abd 1 View  Result Date: 03/28/2018 CLINICAL DATA:  Right flank pain EXAM: ABDOMEN - 1 VIEW COMPARISON:  04/15/2017 FINDINGS: Negative for urinary tract calculi. Normal bowel gas pattern. Lumbar levoscoliosis with degenerative change. IMPRESSION: Negative for renal calculi. Electronically Signed   By: Franchot Gallo M.D.   On: 03/28/2018 16:33   Dg Chest Port 1 View  Result Date: 03/28/2018 CLINICAL DATA:  Sepsis, fever, short of breath EXAM: PORTABLE CHEST 1 VIEW COMPARISON:  Chest x-ray dated 12/19/2017. FINDINGS: New interstitial and/or micronodular opacities at the LEFT lung base, suspicious for edema or pneumonia. No pleural effusion or pneumothorax seen. Heart size and mediastinal contours are  stable. No acute or suspicious osseous finding. IMPRESSION: New subtle interstitial and/or micronodular opacities at the LEFT lung base, suspicious for interstitial edema and/or pneumonia. Electronically Signed   By: Franki Cabot M.D.   On: 03/28/2018 20:19   Dg C-arm 1-60 Min-no Report  Result Date: 03/29/2018 Fluoroscopy was utilized by the  requesting physician.  No radiographic interpretation.   Ct Renal Stone Study  Result Date: 03/28/2018 CLINICAL DATA:  Patient found unresponsive. Elevated respiratory rate, heart rate and fever. Decreased level of consciousness. EXAM: CT ABDOMEN AND PELVIS WITHOUT CONTRAST TECHNIQUE: Multidetector CT imaging of the abdomen and pelvis was performed following the standard protocol without IV contrast. COMPARISON:  Abdominal radiograph performed earlier today at 12:16 p.m., and CT of the abdomen and pelvis performed 04/28/2015 FINDINGS: Lower chest: Minimal bibasilar atelectasis is noted. The visualized portions of the mediastinum are unremarkable. Hepatobiliary: The liver is unremarkable in appearance. The gallbladder is unremarkable in appearance. The common bile duct remains normal in caliber. Pancreas: The pancreas is within normal limits. Spleen: The spleen is unremarkable in appearance. Adrenals/Urinary Tract: The adrenal glands are unremarkable in appearance. Scattered stones noted at the right renal pelvis and renal calyces, measuring up to 6 mm in size. Associated scattered air is noted at the right renal pelvis and right renal calyces. Would correlate clinically for evidence of emphysematous pyelonephritis. There appears to be an obstructing 5 mm stone at the distal right ureter, just above the right vesicoureteral junction, with mild right-sided hydronephrosis. Diffuse right-sided perinephric stranding and fluid are seen. Bilateral renal cysts are noted. A 2.4 cm isodense lesion is noted arising at the lower pole of the left kidney, increased in size from 2016. Malignancy cannot be excluded. Stomach/Bowel: The stomach is unremarkable in appearance. The small bowel is within normal limits. The appendix is normal in caliber, without evidence of appendicitis. Scattered diverticulosis is noted along the descending colon, without evidence of diverticulitis. Vascular/Lymphatic: Minimal calcification is seen  along the abdominal aorta. No retroperitoneal or pelvic sidewall lymphadenopathy is seen. Reproductive: The bladder is decompressed, with a Foley catheter in place. Diffuse bladder wall thickening may reflect chronic inflammation. The uterus is grossly unremarkable in appearance. No suspicious adnexal masses are seen. Other: No additional soft tissue abnormalities are seen. Musculoskeletal: No acute osseous abnormalities are identified. Mild multilevel vacuum phenomenon is noted along the lumbar spine. The visualized musculature is unremarkable in appearance. IMPRESSION: 1. Scattered stones at the right renal pelvis and renal calyces, measuring up to 6 mm in size. Associated scattered air noted at the right renal pelvis and right renal calyces. Diffuse right-sided perinephric stranding and fluid. Would correlate clinically for evidence of emphysematous pyelonephritis. 2. Obstructing 5 mm stone at the distal right ureter, just above the right vesicoureteral junction, with mild right-sided hydronephrosis. 3. 2.4 cm isodense lesion arising at the lower pole of the left kidney, increased in size from 2016. The malignancy cannot be excluded. Contrast-enhanced MRI or CT would be helpful for further evaluation, when and as deemed clinically appropriate. 4. Diffuse bladder wall thickening may reflect chronic inflammation. 5. Scattered diverticulosis along the descending colon, without evidence of diverticulitis. Electronically Signed   By: Garald Balding M.D.   On: 03/28/2018 22:07    Assessment/Plan:  67 y.o. female s/p Right stent placement for distal obstructing stone. Septic shock requiting intubation and pressors. Weaning pressors this morning. WBC 23. Cr 1.7   - Continue critical care management  - Continue foley in setting of need for accurate  I/O - Follow up Urine culture  - Broad spectrum abx, narrow based on culture data - Will require definitive stone treatment as outpatient once patient has recovered     LOS: 0 days   Alla Feeling, MD 03/29/2018, 8:28 AM

## 2018-03-29 NOTE — H&P (Signed)
History and Physical    [name] [name] QIH:[id number] DOB: 1951-04-16 DOA: 03/28/2018  PCP: [name], MD  Patient coming from: Home  I have personally briefly reviewed patient's old medical records in [place]  Chief Complaint: AMS  HPI: [name] [name] is a 67 y.o. female with medical history significant of HPI: cough followed by pulmonology, history of sleep apnea followed by neurology when depression, history of kidney stones, HTN, HLD, DM 2, progressive supranuclear palsy  Presented with episode of unresponsiveness found by family patient was seen by primary care provider today KUB was negative for stones she was diagnosed with UTI /pyelonephritis and was recommended to come to emergency department she was started on Cipro instead patient went home.  After taking 1 dose of Cipro when she came home family found her to be  minimally responsive EMS was called initial vitals blood pressure 109/64 heart rate 154.  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   Review of Systems: Unable to perform due to AMS.  Past Medical History:  Diagnosis Date  . Arthritis    fingers,right shoulder  . 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 Chronic lumbar radiculopathy    L5- S1  right leg  . Depression   . Dizziness   . Frequency of urination   . Hard of hearing     . History of kidney stones 05/12/15   surgery   . HTN (hypertension)   . Hyperlipidemia   . 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 Numbness and tingling in hands   . OSA (obstructive sleep apnea)    moderate OSA per study 11-22-2007--  refused CPAP but used oxygen for 6 months at night,  states due to wt loss stopped using oxygen  . Polyneuropathy, diabetic ([place])    WALKS W/ CANE FOR BALANCE  . Right ureteral stone   . Shortness of breath dyspnea   . SUI (stress urinary incontinence, female)   . Type 2 diabetes mellitus (HCC)    Type II - Diet controlled  . Urinary incontinence   . Wears glasses     Past Surgical History:  Procedure Laterality Date  . ANTERIOR CERVICAL DECOMP/DISCECTOMY FUSION N/A 03/23/2016   Procedure: Cervical Four-Five, Cervical Five-Six, Cervical Six-Seven Anterior cervical decompression/diskectomy/fusion;  Surgeon: Leeroy Cha, MD;  Location: [facility];  Service: Neurosurgery;  Laterality: N/A;  C4-5 C5-6 C6-7 Anterior cervical decompression/diskectomy/fusion  . CARDIOVASCULAR STRESS TEST  09-30-2007     normal Adenosine study/  no ischemia /  normal LV function and wall motion , ef 82%  . COLONOSCOPY    . CYSTOSCOPY WITH RETROGRADE PYELOGRAM, URETEROSCOPY AND STENT PLACEMENT Right 05/12/2015   Procedure: CYSTOSCOPY WITH RETROGRADE PYELOGRAM, URETEROSCOPY,STONE EXTRACTION AND STENT PLACEMENT;  Surgeon: Franchot Gallo, MD;  Location: [facility];  Service: Urology;  Laterality: Right;  .  EXTRACORPOREAL SHOCK WAVE LITHOTRIPSY Right 08-18-2012  . HOLMIUM LASER APPLICATION Right 2/45/8099   Procedure: HOLMIUM LASER APPLICATION;  Surgeon: Franchot Gallo, MD;  Location: [facility];  Service: Urology;  Laterality: Right;  . ORIF HUMERUS FRACTURE Right 07/20/2016   Procedure: OPEN REDUCTION INTERNAL FIXATION (ORIF) PROXIMAL HUMERUS FRACTURE VS REVERSE TOTAL SHOULDER ARTHROPLASTY;  Surgeon: Netta Cedars, MD;  Location: [facility];  Service: Orthopedics;  Laterality: Right;  . SHOULDER ARTHROSCOPY Right 07/2016  . TRANSTHORACIC ECHOCARDIOGRAM  09-23-2007   pseudonormal LV filling pattern,  ef 65-70%/  trivial AR/  mild LAE  . TUBAL LIGATION  1985     reports that she has never smoked. She has never used smokeless tobacco. She reports that she drinks alcohol. She reports that she does not use drugs.  Allergies  Allergen Reactions  . Aleve [Naproxen] Hives, Shortness Of Breath and Rash    Pt Avoids All Nsaids  . Zocor [Simvastatin] Other (See Comments)    "weird feeling all over body"    Family History  Problem Relation Age of Onset  . Heart Problems Father   . Stroke Mother   . Hypertension Unknown   . Stroke Unknown   . Breast cancer Sister      Prior to Admission medications   Medication Sig Start Date End Date Taking? Authorizing Provider  aspirin 81 MG tablet Take 81 mg by mouth at bedtime.    Yes [provider]  azelastine (ASTELIN) 0.1 % nasal spray Place 1 spray into both nostrils 2 (two) times daily.  12/23/14  Yes [provider]  carbidopa-levodopa (SINEMET IR) 25-100 MG tablet 2 tablets TID Patient taking differently: Take 3 tablets by mouth at bedtime. 2 tablets TID 02/07/18  Yes Tat, Eustace Quail, DO  Cholecalciferol (VITAMIN D PO) Take 5,000 Units by mouth daily.   Yes [provider]  ciprofloxacin (CIPRO) 500 MG tablet Take 500 mg by mouth 2 (two) times daily.   Yes [provider]  cyanocobalamin 500 MCG tablet Take 500 mcg by mouth daily.   Yes [provider]  escitalopram (LEXAPRO) 10 MG tablet Take 15 mg at bedtime by mouth.  06/08/16  Yes [provider]  fenofibrate 160 MG tablet Take 160 mg by mouth every morning.    Yes [provider]  fluticasone (FLONASE) 50 MCG/ACT nasal spray Place 2 sprays into both nostrils daily.  12/23/14  Yes [provider]  loratadine (CLARITIN) 10 MG tablet  Take 10 mg by mouth. Taking every other day.   Yes [provider]  Melatonin 5 MG CAPS Take 1 capsule by mouth at bedtime as needed. For sleep   Yes [provider]  metFORMIN (GLUCOPHAGE) 500 MG tablet Take 250 mg by mouth 2 (two) times daily with a meal.    Yes [provider]  montelukast (SINGULAIR) 10 MG tablet Take 10 mg by mouth at bedtime.   Yes [provider]  nystatin cream (MYCOSTATIN) Apply 1 application topically 2 (two) times daily as needed for dry skin.   Yes [provider]  olmesartan (BENICAR) 40 MG tablet Take 40 mg by mouth daily. 02/21/18  Yes [provider]  Propylene Glycol (SYSTANE BALANCE OP) Place 2 drops into both eyes 2 (two) times daily as needed (dry eyes).    Yes [provider]  ranitidine (ZANTAC) 150 MG tablet Take 150 mg by mouth 2 (two) times daily.   Yes [provider]  Menthol, Topical  Analgesic, (BIOFREEZE) 4 % GEL Apply topically.    [provider]    Physical Exam: Vitals:   03/29/18 0115 03/29/18 0120 03/29/18 0125 03/29/18 0130  BP: (!) 95/57 (!) 96/55 103/66 105/61  Pulse: (!) 107 (!) 108 (!) 107 (!) 108  Resp: (!) 29 (!) 29 (!) 28 (!) 29  Temp:    99.5 F (37.5 C)  TempSrc:      SpO2: 96% 97% 96% 98%  Weight:      Height:        Constitutional: Patient altered, confused, and not really able to contribute to history. Eyes: PERRL, lids and conjunctivae normal ENMT: Mucous membranes are moist. Posterior pharynx clear of any exudate or lesions.Normal dentition.  Neck: normal, supple, no masses, no thyromegaly Respiratory: clear to auscultation bilaterally, respiratory distress from earlier seems to have resolved for the moment. Cardiovascular: Tachycardic. Abdomen: no tenderness, no masses palpated. No hepatosplenomegaly. Bowel sounds positive.  Musculoskeletal: no clubbing / cyanosis. No joint deformity upper and lower extremities. Good ROM, no contractures.  Normal muscle tone.  Skin: no rashes, lesions, ulcers. No induration Neurologic: Follows some commands, but confused   Labs on Admission: I have personally reviewed following labs and imaging studies  CBC: Recent Labs  Lab 03/28/18 2041 03/28/18 2052  WBC  --  2.6*  NEUTROABS  --  2.2  HGB 14.6 12.7  HCT 43.0 39.2  MCV  --  89.5  PLT  --  161*   Basic Metabolic Panel: Recent Labs  Lab 03/28/18 2030 03/28/18 2041  NA 138 138  K 3.2* 3.1*  CL 102 103  CO2 19*  --   GLUCOSE 181* 181*  BUN 21* 22*  CREATININE 1.40* 1.30*  CALCIUM 9.2  --    GFR: Estimated Creatinine Clearance: 45.4 mL/min (A) (by C-G formula based on SCr of 1.3 mg/dL (H)). Liver Function Tests: Recent Labs  Lab 03/28/18 2030  AST 34  ALT 10*  ALKPHOS 111  BILITOT 1.3*  PROT 6.8  ALBUMIN 3.4*   No results for input(s): LIPASE, AMYLASE in the last 168 hours. No results for input(s): AMMONIA in the last 168 hours. Coagulation Profile: No results for input(s): INR, PROTIME in the last 168 hours. Cardiac Enzymes: No results for input(s): CKTOTAL, CKMB, CKMBINDEX, TROPONINI in the last 168 hours. BNP (last 3 results) No results for input(s): PROBNP in the last 8760 hours. HbA1C: No results for input(s): HGBA1C in the last 72 hours. CBG: Recent Labs  Lab 03/28/18 2014 03/29/18 0042  GLUCAP 172* 125*   Lipid Profile: No results for input(s): CHOL, HDL, LDLCALC, TRIG, CHOLHDL, LDLDIRECT in the last 72 hours. Thyroid Function Tests: Recent Labs    03/28/18 2030  TSH 0.989   Anemia Panel: No results for input(s): VITAMINB12, FOLATE, FERRITIN, TIBC, IRON, RETICCTPCT in the last 72 hours. Urine analysis:    Component Value Date/Time   COLORURINE YELLOW 03/28/2018 2051   APPEARANCEUR HAZY (A) 03/28/2018 2051   LABSPEC 1.016 03/28/2018 2051   PHURINE 5.0 03/28/2018 2051   GLUCOSEU NEGATIVE 03/28/2018 2051   HGBUR MODERATE (A) 03/28/2018 2051   BILIRUBINUR NEGATIVE 03/28/2018 2051    KETONESUR 5 (A) 03/28/2018 2051   PROTEINUR 100 (A) 03/28/2018 2051   NITRITE NEGATIVE 03/28/2018 2051   LEUKOCYTESUR SMALL (A) 03/28/2018 2051    Radiological Exams on Admission: Dg Abd 1 View  Result Date: 03/28/2018 CLINICAL DATA:  Right flank pain EXAM: ABDOMEN - 1 VIEW COMPARISON:  04/15/2017 FINDINGS: Negative for  urinary tract calculi. Normal bowel gas pattern. Lumbar levoscoliosis with degenerative change. IMPRESSION: Negative for renal calculi. Electronically Signed   By: Franchot Gallo M.D.   On: 03/28/2018 16:33   Dg Chest Port 1 View  Result Date: 03/28/2018 CLINICAL DATA:  Sepsis, fever, short of breath EXAM: PORTABLE CHEST 1 VIEW COMPARISON:  Chest x-ray dated 12/19/2017. FINDINGS: New interstitial and/or micronodular opacities at the LEFT lung base, suspicious for edema or pneumonia. No pleural effusion or pneumothorax seen. Heart size and mediastinal contours are stable. No acute or suspicious osseous finding. IMPRESSION: New subtle interstitial and/or micronodular opacities at the LEFT lung base, suspicious for interstitial edema and/or pneumonia. Electronically Signed   By: Franki Cabot M.D.   On: 03/28/2018 20:19   Dg C-arm 1-60 Min-no Report  Result Date: 03/29/2018 Fluoroscopy was utilized by the requesting physician.  No radiographic interpretation.   Ct Renal Stone Study  Result Date: 03/28/2018 CLINICAL DATA:  Patient found unresponsive. Elevated respiratory rate, heart rate and fever. Decreased level of consciousness. EXAM: CT ABDOMEN AND PELVIS WITHOUT CONTRAST TECHNIQUE: Multidetector CT imaging of the abdomen and pelvis was performed following the standard protocol without IV contrast. COMPARISON:  Abdominal radiograph performed earlier today at 12:16 p.m., and CT of the abdomen and pelvis performed 04/28/2015 FINDINGS: Lower chest: Minimal bibasilar atelectasis is noted. The visualized portions of the mediastinum are unremarkable. Hepatobiliary: The liver is  unremarkable in appearance. The gallbladder is unremarkable in appearance. The common bile duct remains normal in caliber. Pancreas: The pancreas is within normal limits. Spleen: The spleen is unremarkable in appearance. Adrenals/Urinary Tract: The adrenal glands are unremarkable in appearance. Scattered stones noted at the right renal pelvis and renal calyces, measuring up to 6 mm in size. Associated scattered air is noted at the right renal pelvis and right renal calyces. Would correlate clinically for evidence of emphysematous pyelonephritis. There appears to be an obstructing 5 mm stone at the distal right ureter, just above the right vesicoureteral junction, with mild right-sided hydronephrosis. Diffuse right-sided perinephric stranding and fluid are seen. Bilateral renal cysts are noted. A 2.4 cm isodense lesion is noted arising at the lower pole of the left kidney, increased in size from 2016. Malignancy cannot be excluded. Stomach/Bowel: The stomach is unremarkable in appearance. The small bowel is within normal limits. The appendix is normal in caliber, without evidence of appendicitis. Scattered diverticulosis is noted along the descending colon, without evidence of diverticulitis. Vascular/Lymphatic: Minimal calcification is seen along the abdominal aorta. No retroperitoneal or pelvic sidewall lymphadenopathy is seen. Reproductive: The bladder is decompressed, with a Foley catheter in place. Diffuse bladder wall thickening may reflect chronic inflammation. The uterus is grossly unremarkable in appearance. No suspicious adnexal masses are seen. Other: No additional soft tissue abnormalities are seen. Musculoskeletal: No acute osseous abnormalities are identified. Mild multilevel vacuum phenomenon is noted along the lumbar spine. The visualized musculature is unremarkable in appearance. IMPRESSION: 1. Scattered stones at the right renal pelvis and renal calyces, measuring up to 6 mm in size. Associated  scattered air noted at the right renal pelvis and right renal calyces. Diffuse right-sided perinephric stranding and fluid. Would correlate clinically for evidence of emphysematous pyelonephritis. 2. Obstructing 5 mm stone at the distal right ureter, just above the right vesicoureteral junction, with mild right-sided hydronephrosis. 3. 2.4 cm isodense lesion arising at the lower pole of the left kidney, increased in size from 2016. The malignancy cannot be excluded. Contrast-enhanced MRI or CT would be helpful for  further evaluation, when and as deemed clinically appropriate. 4. Diffuse bladder wall thickening may reflect chronic inflammation. 5. Scattered diverticulosis along the descending colon, without evidence of diverticulitis. Electronically Signed   By: Garald Balding M.D.   On: 03/28/2018 22:07    EKG: Independently reviewed.  Assessment/Plan Principal Problem:   Severe sepsis with septic shock (HCC) Active Problems:   Diabetes type 2, uncontrolled (HCC)   OSA on CPAP   Hypokalemia   Elevated lactic acid level   Elevated troponin   Pyohydronephrosis    1. Severe sepsis / septic shock secondary to pyohydronephrosis of R kidney - 1. S/P stent 2. Purulent urine that came out from R kidney post stent sent for culture according to urology notes 3. UCx x2 pending, BCx pending 4. IVF: getting L number 7 and 8 now 5. On neo synephrine, 7mcg for the moment 6. PCCM aware of all of above 7. Will leave patient on zosyn for now 8. Serial lactates 2. OSA - 1. Cant do CPAP secondary to AMS 2. sating well on [place] though 3. Hypokalemia - replacing, repeat BMP in AM 4. Elevated troponin - 1. Likely demand ischemia 2. Tele monitor 3. Serial trops 5. DM2 - 1. Holding home PO meds 2. Sensitive SSI q4h for now  DVT prophylaxis: Lovenox Code Status: DNR Family Communication: No family in room Disposition Plan: Home after admit Consults called: PCCM notified of patient, they want to hold  off on formal consult for now and follow remotely Admission status: Admit to inpatient ICU.   Etta Quill DO [facility] Pager [phone]  If 7AM-7PM, please contact day team taking care of patient [email or web address] Password [email or web address]  03/29/2018, 1:53 AM

## 2018-03-29 NOTE — Anesthesia Procedure Notes (Signed)
Procedure Name: Intubation Date/Time: 03/29/2018 12:08 AM Performed by: Lissa Morales, CRNA Pre-anesthesia Checklist: Patient identified, Emergency Drugs available, Suction available and Patient being monitored Patient Re-evaluated:Patient Re-evaluated prior to induction Oxygen Delivery Method: Circle system utilized Preoxygenation: Pre-oxygenation with 100% oxygen Induction Type: Rapid sequence and Cricoid Pressure applied Ventilation: Mask ventilation without difficulty Laryngoscope Size: Glidescope and 3 Grade View: Grade III Tube type: Oral Number of attempts: 1 Airway Equipment and Method: Stylet,  Oral airway and Video-laryngoscopy Placement Confirmation: ETT inserted through vocal cords under direct vision,  positive ETCO2 and breath sounds checked- equal and bilateral Secured at: 21 cm Tube secured with: Tape Dental Injury: Teeth and Oropharynx as per pre-operative assessment  Difficulty Due To: Difficulty was anticipated, Difficult Airway- due to anterior larynx, Difficult Airway- due to limited oral opening, Difficult Airway- due to dentition and Difficult Airway- due to reduced neck mobility

## 2018-03-29 NOTE — Progress Notes (Signed)
[facility] Progress Note Patient Name: [name] [name] DOB: 06/03/1951 MRN: [id number]   Date of Service  03/29/2018  HPI/Events of Note  Hypomag  eICU Interventions  Mag replaced     Intervention Category Intermediate Interventions: Electrolyte abnormality - evaluation and management  DETERDING,Dallie 03/29/2018, 6:05 AM

## 2018-03-30 ENCOUNTER — Inpatient Hospital Stay (HOSPITAL_COMMUNITY): Payer: PPO

## 2018-03-30 LAB — CBC
HEMATOCRIT: 33.7 % — AB (ref 36.0–46.0)
HEMOGLOBIN: 11.1 g/dL — AB (ref 12.0–15.0)
MCH: 29 pg (ref 26.0–34.0)
MCHC: 32.9 g/dL (ref 30.0–36.0)
MCV: 88 fL (ref 78.0–100.0)
Platelets: 124 10*3/uL — ABNORMAL LOW (ref 150–400)
RBC: 3.83 MIL/uL — AB (ref 3.87–5.11)
RDW: 14.7 % (ref 11.5–15.5)
WBC: 19.2 10*3/uL — ABNORMAL HIGH (ref 4.0–10.5)

## 2018-03-30 LAB — PHOSPHORUS: PHOSPHORUS: 2.8 mg/dL (ref 2.5–4.6)

## 2018-03-30 LAB — URINE CULTURE
Culture: <10000 — AB
Culture: NO GROWTH
Culture: NO GROWTH

## 2018-03-30 LAB — BASIC METABOLIC PANEL
ANION GAP: 15 (ref 5–15)
BUN: 37 mg/dL — ABNORMAL HIGH (ref 6–20)
CALCIUM: 8 mg/dL — AB (ref 8.9–10.3)
CO2: 22 mmol/L (ref 22–32)
Chloride: 99 mmol/L — ABNORMAL LOW (ref 101–111)
Creatinine, Ser: 1.88 mg/dL — ABNORMAL HIGH (ref 0.44–1.00)
GFR, EST AFRICAN AMERICAN: 31 mL/min — AB (ref >60)
GFR, EST NON AFRICAN AMERICAN: 27 mL/min — AB (ref >60)
Glucose, Bld: 262 mg/dL — ABNORMAL HIGH (ref 65–99)
POTASSIUM: 3.5 mmol/L (ref 3.5–5.1)
Sodium: 136 mmol/L (ref 135–145)

## 2018-03-30 LAB — GLUCOSE, CAPILLARY
GLUCOSE-CAPILLARY: 268 mg/dL — AB (ref 65–99)
GLUCOSE-CAPILLARY: 111 mg/dL — AB (ref 65–99)
Glucose-Capillary: 209 mg/dL — ABNORMAL HIGH (ref 65–99)
Glucose-Capillary: 95 mg/dL (ref 65–99)
Glucose-Capillary: 115 mg/dL — ABNORMAL HIGH (ref 65–99)

## 2018-03-30 LAB — MAGNESIUM: MAGNESIUM: 2.2 mg/dL (ref 1.7–2.4)

## 2018-03-30 LAB — PROCALCITONIN

## 2018-03-30 MED ORDER — CARBIDOPA-LEVODOPA 25-100 MG PO TABS
2.0000 | ORAL_TABLET | Freq: Three times a day (TID) | ORAL | Status: DC
  Administered 2018-03-30 – 2018-04-08 (×28): 2 via ORAL
  Filled 2018-03-30 (×30): qty 2

## 2018-03-30 MED ORDER — POTASSIUM CHLORIDE 10 MEQ/100ML IV SOLN
10.0000 meq | INTRAVENOUS | Status: AC
  Administered 2018-03-30 – 2018-03-31 (×4): 10 meq via INTRAVENOUS
  Filled 2018-03-30 (×4): qty 100

## 2018-03-30 MED ORDER — NOREPINEPHRINE BITARTRATE 1 MG/ML IV SOLN
0.0000 ug/min | INTRAVENOUS | Status: DC
  Administered 2018-03-30: 4 ug/min via INTRAVENOUS
  Filled 2018-03-30: qty 4

## 2018-03-30 MED ORDER — SODIUM CHLORIDE 0.9 % IV SOLN
INTRAVENOUS | Status: DC
  Administered 2018-03-30 – 2018-03-31 (×3): via INTRAVENOUS

## 2018-03-30 MED ORDER — LIP MEDEX EX OINT
TOPICAL_OINTMENT | CUTANEOUS | Status: AC
  Administered 2018-03-30: 15:00:00
  Filled 2018-03-30: qty 7

## 2018-03-30 MED ORDER — ACETAMINOPHEN 650 MG RE SUPP: 650.0000 mg | Freq: Four times a day (QID) | RECTAL | Status: DC | PRN

## 2018-03-30 MED ORDER — FLUTICASONE PROPIONATE 50 MCG/ACT NA SUSP
2.0000 | Freq: Every day | NASAL | Status: DC
  Administered 2018-03-30 – 2018-04-08 (×10): 2 via NASAL
  Filled 2018-03-30: qty 16

## 2018-03-30 MED ORDER — ESCITALOPRAM OXALATE 10 MG PO TABS
15.0000 mg | ORAL_TABLET | Freq: Every day | ORAL | Status: DC
  Administered 2018-03-30 – 2018-04-07 (×9): 15 mg via ORAL
  Filled 2018-03-30 (×2): qty 2
  Filled 2018-03-30: qty 1
  Filled 2018-03-30: qty 2
  Filled 2018-03-30 (×3): qty 1
  Filled 2018-03-30: qty 2
  Filled 2018-03-30: qty 1
  Filled 2018-03-30: qty 2

## 2018-03-30 MED ORDER — AZELASTINE HCL 0.1 % NA SOLN
1.0000 | Freq: Two times a day (BID) | NASAL | Status: DC
  Administered 2018-03-30 – 2018-04-08 (×19): 1 via NASAL
  Filled 2018-03-30 (×2): qty 30

## 2018-03-30 MED ORDER — INSULIN ASPART 100 UNIT/ML ~~LOC~~ SOLN
0.0000 [IU] | SUBCUTANEOUS | Status: DC
  Administered 2018-03-30: 8 [IU] via SUBCUTANEOUS
  Administered 2018-03-30: 5 [IU] via SUBCUTANEOUS
  Administered 2018-03-30 (×2): 8 [IU] via SUBCUTANEOUS

## 2018-03-30 MED ORDER — HYDROCORTISONE NA SUCCINATE PF 100 MG IJ SOLR
25.0000 mg | Freq: Four times a day (QID) | INTRAMUSCULAR | Status: DC
  Administered 2018-03-30 – 2018-04-01 (×8): 25 mg via INTRAVENOUS
  Filled 2018-03-30 (×8): qty 2

## 2018-03-30 MED ORDER — VANCOMYCIN HCL 10 G IV SOLR: 1250.0000 mg | INTRAVENOUS | Status: DC

## 2018-03-30 MED ORDER — FAMOTIDINE IN NACL 20-0.9 MG/50ML-% IV SOLN
20.0000 mg | Freq: Two times a day (BID) | INTRAVENOUS | Status: DC
  Administered 2018-03-30 (×2): 20 mg via INTRAVENOUS
  Filled 2018-03-30 (×2): qty 50

## 2018-03-30 MED ORDER — ACETAMINOPHEN 160 MG/5ML PO SOLN
650.0000 mg | Freq: Four times a day (QID) | ORAL | Status: DC | PRN
  Administered 2018-04-01 – 2018-04-08 (×8): 650 mg via ORAL
  Filled 2018-03-30 (×8): qty 20.3

## 2018-03-30 MED ORDER — ORAL CARE MOUTH RINSE
15.0000 mL | Freq: Two times a day (BID) | OROMUCOSAL | Status: DC
  Administered 2018-03-30 – 2018-04-08 (×14): 15 mL via OROMUCOSAL

## 2018-03-30 MED ORDER — FENTANYL CITRATE (PF) 100 MCG/2ML IJ SOLN: 25.0000 ug | INTRAMUSCULAR | Status: DC | PRN

## 2018-03-30 MED ORDER — MONTELUKAST SODIUM 10 MG PO TABS
10.0000 mg | ORAL_TABLET | Freq: Every day | ORAL | Status: DC
Start: 2018-03-30 — End: 2018-04-08
  Administered 2018-03-30 – 2018-04-07 (×9): 10 mg via ORAL
  Filled 2018-03-30 (×8): qty 1

## 2018-03-30 NOTE — Progress Notes (Signed)
Urology Progress Note   2 Days Post-Op  Subjective: Pressors weaned slightly yesterday, on Vaso and NE still  Intubated  Continued on vanc/zosyn  Blood culture NG, Urine cultures IP Afebrile over the past 24 hours,non tachy Foley draining clear urine - 910 yesterday  WBC - 19.2, Cr - 1.88, Lactate 3  Objective: Vital signs in last 24 hours: Temp:  [98.8 F (37.1 C)-99.9 F (37.7 C)] 99.9 F (37.7 C) (05/19 0700) Pulse Rate:  [60-84] 61 (05/19 0700) Resp:  [17-25] 24 (05/19 0700) BP: (88-169)/(37-105) 148/73 (05/19 0700) SpO2:  [93 %-100 %] 98 % (05/19 0700) FiO2 (%):  [30 %-40 %] 30 % (05/19 0308) Weight:  [100.5 kg (221 lb 9 oz)] 100.5 kg (221 lb 9 oz) (05/19 0500)  Intake/Output from previous day: 05/18 0701 - 05/19 0700 In: 4325.7 [I.V.:3905.7; NG/GT:270; IV Piggyback:150] Out: 1010 [Urine:910; Emesis/NG output:100] Intake/Output this shift: No intake/output data recorded.  Physical Exam:  General: Critically ill  CV: RRR Lungs: Intubated, mechanical breath sounds Abdomen: Soft, appropriately tender.  GU: Foley in place draining murky urine   Lab Results: Recent Labs    03/28/18 2052 03/29/18 0500 03/30/18 0430  HGB 12.7 12.5 11.1*  HCT 39.2 38.4 33.7*   BMET Recent Labs    03/29/18 0500 03/30/18 0430  NA 139 136  K 3.1* 3.5  CL 107 99*  CO2 19* 22  GLUCOSE 145* 262*  BUN 24* 37*  CREATININE 1.78* 1.88*  CALCIUM 8.0* 8.0*     Studies/Results: Dg Chest 1 View  Result Date: 03/29/2018 CLINICAL DATA:  Endotracheal tube and central line placement. EXAM: CHEST  1 VIEW COMPARISON:  Chest radiograph performed 03/28/2018 FINDINGS: The patient's endotracheal tube is seen ending 1-2 cm above the carina. An enteric tube is noted extending below the diaphragm, with the side port at the distal esophagus. This could be advanced approximately 5 cm. A right IJ line is noted ending about the distal SVC. The lungs are hypoexpanded. Increased interstitial markings  may reflect mild interstitial edema. No definite pleural effusion or pneumothorax is seen. The cardiomediastinal silhouette is mildly enlarged. No acute osseous abnormalities are seen. The patient's right glenohumeral joint arthroplasty is partially characterized and appears grossly unremarkable. IMPRESSION: 1. Endotracheal tube seen ending 1 - 2 cm above the carina. 2. Enteric tube noted extending below the diaphragm, with the side port at the distal esophagus. This could be advanced approximately 5 cm, as deemed clinically appropriate. 3. Lungs hypoexpanded. Increased interstitial markings may reflect mild interstitial edema. 4. No pneumothorax. Electronically Signed   By: Garald Balding M.D.   On: 03/29/2018 05:22   Dg Abd 1 View  Result Date: 03/28/2018 CLINICAL DATA:  Right flank pain EXAM: ABDOMEN - 1 VIEW COMPARISON:  04/15/2017 FINDINGS: Negative for urinary tract calculi. Normal bowel gas pattern. Lumbar levoscoliosis with degenerative change. IMPRESSION: Negative for renal calculi. Electronically Signed   By: Franchot Gallo M.D.   On: 03/28/2018 16:33   Portable Chest Xray  Result Date: 03/30/2018 CLINICAL DATA:  Acute respiratory failure with hypoxia. EXAM: PORTABLE CHEST 1 VIEW COMPARISON:  03/29/2018 FINDINGS: Endotracheal tube with tip 2.6 cm above the carina. Nasogastric tube courses into the region of the stomach and off the inferior portion of the film as tip is not visualized. Right IJ central venous catheter has tip over the SVC. Lungs are hypoinflated with subtle left basilar/retrocardiac opacification which may be due to atelectasis or infection. Cardiomediastinal silhouette and remainder of the exam is unchanged.  IMPRESSION: Minimal left base opacification which may be due to atelectasis or infection. Tubes and lines as described. Electronically Signed   By: Marin Olp M.D.   On: 03/30/2018 07:11   Dg Chest Port 1 View  Result Date: 03/28/2018 CLINICAL DATA:  Sepsis, fever, short  of breath EXAM: PORTABLE CHEST 1 VIEW COMPARISON:  Chest x-ray dated 12/19/2017. FINDINGS: New interstitial and/or micronodular opacities at the LEFT lung base, suspicious for edema or pneumonia. No pleural effusion or pneumothorax seen. Heart size and mediastinal contours are stable. No acute or suspicious osseous finding. IMPRESSION: New subtle interstitial and/or micronodular opacities at the LEFT lung base, suspicious for interstitial edema and/or pneumonia. Electronically Signed   By: Franki Cabot M.D.   On: 03/28/2018 20:19   Dg C-arm 1-60 Min-no Report  Result Date: 03/29/2018 Fluoroscopy was utilized by the requesting physician.  No radiographic interpretation.   Ct Renal Stone Study  Result Date: 03/28/2018 CLINICAL DATA:  Patient found unresponsive. Elevated respiratory rate, heart rate and fever. Decreased level of consciousness. EXAM: CT ABDOMEN AND PELVIS WITHOUT CONTRAST TECHNIQUE: Multidetector CT imaging of the abdomen and pelvis was performed following the standard protocol without IV contrast. COMPARISON:  Abdominal radiograph performed earlier today at 12:16 p.m., and CT of the abdomen and pelvis performed 04/28/2015 FINDINGS: Lower chest: Minimal bibasilar atelectasis is noted. The visualized portions of the mediastinum are unremarkable. Hepatobiliary: The liver is unremarkable in appearance. The gallbladder is unremarkable in appearance. The common bile duct remains normal in caliber. Pancreas: The pancreas is within normal limits. Spleen: The spleen is unremarkable in appearance. Adrenals/Urinary Tract: The adrenal glands are unremarkable in appearance. Scattered stones noted at the right renal pelvis and renal calyces, measuring up to 6 mm in size. Associated scattered air is noted at the right renal pelvis and right renal calyces. Would correlate clinically for evidence of emphysematous pyelonephritis. There appears to be an obstructing 5 mm stone at the distal right ureter, just  above the right vesicoureteral junction, with mild right-sided hydronephrosis. Diffuse right-sided perinephric stranding and fluid are seen. Bilateral renal cysts are noted. A 2.4 cm isodense lesion is noted arising at the lower pole of the left kidney, increased in size from 2016. Malignancy cannot be excluded. Stomach/Bowel: The stomach is unremarkable in appearance. The small bowel is within normal limits. The appendix is normal in caliber, without evidence of appendicitis. Scattered diverticulosis is noted along the descending colon, without evidence of diverticulitis. Vascular/Lymphatic: Minimal calcification is seen along the abdominal aorta. No retroperitoneal or pelvic sidewall lymphadenopathy is seen. Reproductive: The bladder is decompressed, with a Foley catheter in place. Diffuse bladder wall thickening may reflect chronic inflammation. The uterus is grossly unremarkable in appearance. No suspicious adnexal masses are seen. Other: No additional soft tissue abnormalities are seen. Musculoskeletal: No acute osseous abnormalities are identified. Mild multilevel vacuum phenomenon is noted along the lumbar spine. The visualized musculature is unremarkable in appearance. IMPRESSION: 1. Scattered stones at the right renal pelvis and renal calyces, measuring up to 6 mm in size. Associated scattered air noted at the right renal pelvis and right renal calyces. Diffuse right-sided perinephric stranding and fluid. Would correlate clinically for evidence of emphysematous pyelonephritis. 2. Obstructing 5 mm stone at the distal right ureter, just above the right vesicoureteral junction, with mild right-sided hydronephrosis. 3. 2.4 cm isodense lesion arising at the lower pole of the left kidney, increased in size from 2016. The malignancy cannot be excluded. Contrast-enhanced MRI or CT would be helpful  for further evaluation, when and as deemed clinically appropriate. 4. Diffuse bladder wall thickening may reflect  chronic inflammation. 5. Scattered diverticulosis along the descending colon, without evidence of diverticulitis. Electronically Signed   By: Garald Balding M.D.   On: 03/28/2018 22:07    Assessment/Plan:  67 y.o. female s/p Right stent placement for distal obstructing stone. Septic shock requiting intubation and pressors. Slowly being weaned off pressors. Intubated and sedated.   - Continue critical care management  - Continue foley to drainage  - Follow up Urine culture  - Broad spectrum abx, narrow based on culture data - Will require definitive stone treatment as outpatient once patient has recovered    LOS: 1 day   Alla Feeling, MD 03/30/2018, 7:43 AM

## 2018-03-30 NOTE — Progress Notes (Signed)
K 3.5 this AM was not repleted. Will give 40MEQ KCL IV now. Patient's MAP consistently around 60 for the past several hours; vasopressors have been turned off. I called RN and ask him to turn back on levophed if MAP < 65. Patient was also on hydrocortisone which was stopped abruptly today. Will resume hydrocort at a lower dose 25mg  IV q6 (instead of 50mg ) and try to wean off over next couple days.

## 2018-03-30 NOTE — Progress Notes (Signed)
Pharmacy Antibiotic Note  [name] [name] is a 67 y.o. female admitted on 03/28/2018 with intra-abdominal infection.  Pharmacy has been consulted for zosyn and vancomycin dosing. Patient is s/p cystoscopy with stent placement .  03/30/2018 AF past 24 hrs, SCr up to 1.88. WBC 19.2 (on solucortef) , no + culture data  Plan: Zosyn 3.375g IV q8h (4 hour infusion).  Change vancomycin to 1250 mg IV q48 for est AUC 490, next dose due 5/20 at 2000 F/u SCr - BMET ordered for am F/u cultures/levels  Height: 5\' 5"  (165.1 cm) Weight: 221 lb 9 oz (100.5 kg) IBW/kg (Calculated) : 57  Temp (24hrs), Avg:99.4 F (37.4 C), Min:98.8 F (37.1 C), Max:99.9 F (37.7 C)  Recent Labs  Lab 03/28/18 2030 03/28/18 2041 03/28/18 2042 03/28/18 2052 03/29/18 0203 03/29/18 0428 03/29/18 0500 03/29/18 0823 03/29/18 1226 03/30/18 0430  WBC  --   --   --  2.6*  --   --  23.6*  --   --  19.2*  CREATININE 1.40* 1.30*  --   --   --   --  1.78*  --   --  1.88*  LATICACIDVEN  --   --  6.24*  --  3.7* 3.0*  --  2.9* 2.6*  --     Estimated Creatinine Clearance: 34.6 mL/min (A) (by C-G formula based on SCr of 1.88 mg/dL (H)).    Allergies  Allergen Reactions  . Aleve [Naproxen] Hives, Shortness Of Breath and Rash    Pt Avoids All Nsaids  . Zocor [Simvastatin] Other (See Comments)    "weird feeling all over body"  Antimicrobials this admission: 5/17 zosyn >>  5/17 vancomycin >>  Dose adjustments this admission: 5/17 Vancomycin 2 gm x1 then 1 gm IV q36h for est AUC = 507 (SCr 1.3) 5/18 est AUC 503.5 using SCr 1.78 1 gm q36 5/19 est AUC 522.5 , Cmin 13.4. using SCr 1.88 1 gm q36- change to 1250 q48 for est AUC 490 Microbiology results: 5/18 MRSA PCR neg 5/18 Ucx cystoscope>>  < 10 colon insig growth F 5/17 Ucx>>ngF 5/17 BCx2>> ngtd  Thank you for allowing pharmacy to be a part of this patient's care.  Eudelia Bunch, Pharm.D. [phone] 03/30/2018 12:49 PM

## 2018-03-30 NOTE — Progress Notes (Signed)
[facility] Progress Note Patient Name: [name] [name] DOB: Jul 14, 1951 MRN: [id number]   Date of Service  03/30/2018  HPI/Events of Note  Hypotension - BP = 94/45 with MAP = 60.   eICU Interventions  Will restart Norepinephrine IV infusion.     Intervention Category Major Interventions: Hypotension - evaluation and management  [name] 03/30/2018, 9:29 PM

## 2018-03-30 NOTE — Procedures (Signed)
Extubation Procedure Note  Patient Details:   Name: [name] [name] DOB: Nov 25, 1950 MRN: [id number]   Airway Documentation:    Vent end date: 03/30/18 Vent end time: 1039   Evaluation  O2 sats: stable throughout Complications: No apparent complications Patient did tolerate procedure well. Bilateral Breath Sounds: Clear, Diminished   Yes  Johnette Abraham 03/30/2018, 10:51 AM

## 2018-03-30 NOTE — Progress Notes (Signed)
PULMONARY / CRITICAL CARE MEDICINE   Name: [name] [name] MRN: [id number] DOB: 27-Jul-1951    ADMISSION DATE:  03/28/2018 CONSULTATION DATE:  03/29/2018  REFERRING MD:  Dr. Alcario Drought  CHIEF COMPLAINT:  Altered mental status  HISTORY OF PRESENT ILLNESS:   67 yo female brought to ER with altered mental status after recent dx of UTI.  Found to have Rt distal ureteral stone with hydronephrosis and had stenting by urology.  PMHx of chronic back pain, depression, HTN, HLD, multiple system atrophy, OSA, DM with neuropathy, chronic cough, reflux.  SUBJECTIVE:  Tolerating pressure support.  VITAL SIGNS: BP 93/75   Pulse 81   Temp 99.7 F (37.6 C)   Resp 20   Ht 5\' 5"  (1.651 m)   Wt 221 lb 9 oz (100.5 kg)   SpO2 100%   BMI 36.87 kg/m   HEMODYNAMICS: CVP:  [4 mmHg-19 mmHg] 19 mmHg  VENTILATOR SETTINGS: Vent Mode: PSV;CPAP FiO2 (%):  [30 %-40 %] 30 % Set Rate:  [24 bmp] 24 bmp Vt Set:  [460 mL] 460 mL PEEP:  [5 cmH20] 5 cmH20 Pressure Support:  [10 cmH20] 10 cmH20 Plateau Pressure:  [17 cmH20-23 cmH20] 19 cmH20  INTAKE / OUTPUT: I/O last 3 completed shifts: In: 27062.3 [I.V.:8901.2; NG/GT:310; IV Piggyback:3450] Out: 7628 [Urine:1050; Emesis/NG output:100; Blood:5]  PHYSICAL EXAMINATION:  General - more alert Eyes - pupils reactive ENT - ETT in place Cardiac - regular, no murmur Chest - no wheeze, rales Abd - soft, non tender Ext - no edema Skin - no rashes Neuro - follows commands  LABS:  BMET Recent Labs  Lab 03/28/18 2030 03/28/18 2041 03/29/18 0500 03/30/18 0430  NA 138 138 139 136  K 3.2* 3.1* 3.1* 3.5  CL 102 103 107 99*  CO2 19*  --  19* 22  BUN 21* 22* 24* 37*  CREATININE 1.40* 1.30* 1.78* 1.88*  GLUCOSE 181* 181* 145* 262*    Electrolytes Recent Labs  Lab 03/28/18 2030  03/29/18 0500 03/29/18 1226 03/29/18 1620 03/30/18 0430  CALCIUM 9.2  --  8.0*  --   --  8.0*  MG  --    < > 1.2* 2.2 2.2 2.2  PHOS  --    < > 2.6 3.9 3.6 2.8   < > =  values in this interval not displayed.    CBC Recent Labs  Lab 03/28/18 2052 03/29/18 0500 03/30/18 0430  WBC 2.6* 23.6* 19.2*  HGB 12.7 12.5 11.1*  HCT 39.2 38.4 33.7*  PLT 142* 165 124*    Coag's No results for input(s): APTT, INR in the last 168 hours.  Sepsis Markers Recent Labs  Lab 03/29/18 0243 03/29/18 0428 03/29/18 0823 03/29/18 1226 03/30/18 0430  LATICACIDVEN  --  3.0* 2.9* 2.6*  --   PROCALCITON >150.00  --   --   --  >150.00    ABG Recent Labs  Lab 03/28/18 2025 03/29/18 0533 03/29/18 0800  PHART 7.393 7.261* 7.359  PCO2ART 28.9* 41.5 33.6  PO2ART 121* 324* 119*    Liver Enzymes Recent Labs  Lab 03/28/18 2030  AST 34  ALT 10*  ALKPHOS 111  BILITOT 1.3*  ALBUMIN 3.4*    Cardiac Enzymes Recent Labs  Lab 03/29/18 0823 03/29/18 1539 03/29/18 2030  TROPONINI 0.92* 0.76* 0.83*    Glucose Recent Labs  Lab 03/29/18 0042 03/29/18 0835 03/29/18 1218 03/29/18 1947 03/29/18 2342 03/30/18 0755  GLUCAP 125* 149* 197* 220* 263* 268*    Imaging Portable Chest  Xray  Result Date: 03/30/2018 CLINICAL DATA:  Acute respiratory failure with hypoxia. EXAM: PORTABLE CHEST 1 VIEW COMPARISON:  03/29/2018 FINDINGS: Endotracheal tube with tip 2.6 cm above the carina. Nasogastric tube courses into the region of the stomach and off the inferior portion of the film as tip is not visualized. Right IJ central venous catheter has tip over the SVC. Lungs are hypoinflated with subtle left basilar/retrocardiac opacification which may be due to atelectasis or infection. Cardiomediastinal silhouette and remainder of the exam is unchanged. IMPRESSION: Minimal left base opacification which may be due to atelectasis or infection. Tubes and lines as described. Electronically Signed   By: Marin Olp M.D.   On: 03/30/2018 07:11     STUDIES:  Renal stone study 5/17 >> Scattered stones at the right renal pelvis and renal calyces, diffuse right-sided perinephric  stranding and fluid, obstructing 5 mm stone at the distal right ureter, just above the right vesicoureteral junction, with mild right-sided hydronephrosis, 2.4 cm isodense lesion arising at the lower pole of the left kidney, increased in size from 2016; malignancy cannot be excluded  CULTURES: Blood 5/17 >> Urine 5/17 >>   ANTIBIOTICS: Vancomycin 5/17 >> Zosyn 5/17 >>   SIGNIFICANT EVENTS: 5/17 Admit, cystoscopy with stent placement 5/18 septic shock, VDRF  LINES/TUBES: ETT 5/18 >> Rt IJ CVL 5/18 >>  DISCUSSION: 67 yo female with septic shock, VDRF from nephrolithiasis with hydronephrosis and UTI.  Hemodynamics improved, and tolerating pressure support.  ASSESSMENT / PLAN:  Septic shock. - pressors to keep MAP > 65 - continue IV fluids  Acute respiratory failure with hypoxia. - proceed with extubation 5/19  OSA. - CPAP qhs  Nephrolithiasis with hydronephrosis causing UTI. - day 3 of ABx  Acute renal failure from hydronephrosis. Hypokalemia, hypomagnesemia. Non gap metabolic acidosis >> resolved. - d/c HCO3 - f/u BMET - replace electrolytes as needed  Acute metabolic encephalopathy >> improved. Multisystem atrophy. - resume sinemet, lexapro when able to take pills  Relative adrenal insufficiency. - continue solu cortef while on pressors  DM. - SSI  DVT prophylaxis - lovenox SUP - pepcid Nutrition - NPO Goals of care - full code  CC time 31 minutes  Chesley Mires, MD [place] 03/30/2018, 10:31 AM

## 2018-03-31 ENCOUNTER — Encounter (HOSPITAL_COMMUNITY): Payer: Self-pay

## 2018-03-31 LAB — GLUCOSE, CAPILLARY
GLUCOSE-CAPILLARY: 124 mg/dL — AB (ref 65–99)
Glucose-Capillary: 112 mg/dL — ABNORMAL HIGH (ref 65–99)
Glucose-Capillary: 141 mg/dL — ABNORMAL HIGH (ref 65–99)
Glucose-Capillary: 123 mg/dL — ABNORMAL HIGH (ref 65–99)
Glucose-Capillary: 158 mg/dL — ABNORMAL HIGH (ref 65–99)

## 2018-03-31 LAB — T4: T4, Total: 6.8 ug/dL (ref 4.5–12.0)

## 2018-03-31 LAB — PROCALCITONIN: Procalcitonin: 109.41 ng/mL

## 2018-03-31 LAB — CBC
HEMATOCRIT: 32.8 % — AB (ref 36.0–46.0)
HEMOGLOBIN: 10.8 g/dL — AB (ref 12.0–15.0)
MCH: 29.2 pg (ref 26.0–34.0)
MCHC: 32.9 g/dL (ref 30.0–36.0)
MCV: 88.6 fL (ref 78.0–100.0)
Platelets: 129 10*3/uL — ABNORMAL LOW (ref 150–400)
RBC: 3.7 MIL/uL — AB (ref 3.87–5.11)
RDW: 14.6 % (ref 11.5–15.5)
WBC: 21.8 10*3/uL — AB (ref 4.0–10.5)

## 2018-03-31 LAB — BASIC METABOLIC PANEL
ANION GAP: 13 (ref 5–15)
BUN: 38 mg/dL — ABNORMAL HIGH (ref 6–20)
CO2: 26 mmol/L (ref 22–32)
Calcium: 8 mg/dL — ABNORMAL LOW (ref 8.9–10.3)
Chloride: 104 mmol/L (ref 101–111)
Creatinine, Ser: 1.75 mg/dL — ABNORMAL HIGH (ref 0.44–1.00)
GFR, EST AFRICAN AMERICAN: 34 mL/min — AB (ref >60)
GFR, EST NON AFRICAN AMERICAN: 29 mL/min — AB (ref >60)
Glucose, Bld: 113 mg/dL — ABNORMAL HIGH (ref 65–99)
POTASSIUM: 3.6 mmol/L (ref 3.5–5.1)
SODIUM: 143 mmol/L (ref 135–145)

## 2018-03-31 LAB — MAGNESIUM: Magnesium: 2.4 mg/dL (ref 1.7–2.4)

## 2018-03-31 LAB — PHOSPHORUS: PHOSPHORUS: 2.3 mg/dL — AB (ref 2.5–4.6)

## 2018-03-31 MED ORDER — FAMOTIDINE 20 MG PO TABS
20.0000 mg | ORAL_TABLET | Freq: Two times a day (BID) | ORAL | Status: DC
  Administered 2018-03-31 – 2018-04-05 (×11): 20 mg via ORAL
  Filled 2018-03-31 (×11): qty 1

## 2018-03-31 MED ORDER — INSULIN ASPART 100 UNIT/ML ~~LOC~~ SOLN: 0.0000 [IU] | Freq: Every day | SUBCUTANEOUS | Status: DC

## 2018-03-31 MED ORDER — LOPERAMIDE HCL 2 MG PO CAPS
2.0000 mg | ORAL_CAPSULE | ORAL | Status: DC | PRN
  Administered 2018-03-31: 2 mg via ORAL
  Filled 2018-03-31: qty 1

## 2018-03-31 MED ORDER — INSULIN ASPART 100 UNIT/ML ~~LOC~~ SOLN
0.0000 [IU] | Freq: Three times a day (TID) | SUBCUTANEOUS | Status: DC
  Administered 2018-03-31 – 2018-04-01 (×3): 2 [IU] via SUBCUTANEOUS
  Administered 2018-04-01: 3 [IU] via SUBCUTANEOUS
  Administered 2018-04-01: 2 [IU] via SUBCUTANEOUS
  Administered 2018-04-02: 3 [IU] via SUBCUTANEOUS
  Administered 2018-04-02: 2 [IU] via SUBCUTANEOUS
  Administered 2018-04-02 – 2018-04-03 (×2): 3 [IU] via SUBCUTANEOUS
  Administered 2018-04-03 – 2018-04-04 (×2): 2 [IU] via SUBCUTANEOUS
  Administered 2018-04-05: 5 [IU] via SUBCUTANEOUS
  Administered 2018-04-06: 3 [IU] via SUBCUTANEOUS
  Administered 2018-04-06 – 2018-04-07 (×3): 2 [IU] via SUBCUTANEOUS
  Administered 2018-04-08: 3 [IU] via SUBCUTANEOUS

## 2018-03-31 NOTE — Significant Event (Signed)
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  Patient was placed under the pulmonary critical care service due to hypotension and acute hypoxic respiratory failure.  She was on pressors and intubated until 03/30/2018.  Her urine cultures have grown less than 10,000 colonies and her blood cultures are negative.  She was initially on vancomycin and Zosyn however was transitioned just to Zosyn on transfer.  She is noted to have AKI as well.

## 2018-03-31 NOTE — Progress Notes (Signed)
PULMONARY / CRITICAL CARE MEDICINE   Name: [name] [name] MRN: [id number] DOB: 11-15-1950    ADMISSION DATE:  03/28/2018 CONSULTATION DATE:  03/29/2018  REFERRING MD:  Dr. Alcario Drought  CHIEF COMPLAINT:  Altered mental status  HISTORY OF PRESENT ILLNESS:   67 yo female brought to ER with altered mental status after recent dx of UTI.  Found to have Rt distal ureteral stone with hydronephrosis and had stenting by urology.  PMHx of chronic back pain, depression, HTN, HLD, multiple system atrophy, OSA, DM with neuropathy, chronic cough, reflux.  SUBJECTIVE:  Pt states she is feeling better, complaints of diarrhea overnight Levophed off  ROS: Positives in BOLD: Gen: Denies fever, chills, weight change, fatigue, night sweats HEENT: Denies blurred vision, double vision, hearing loss, tinnitus, sinus congestion, rhinorrhea, sore throat, neck stiffness, dysphagia PULM: Denies shortness of breath, cough, sputum production, hemoptysis, wheezing CV: Denies chest pain, edema, orthopnea, paroxysmal nocturnal dyspnea, palpitations GI: Denies abdominal pain, nausea, vomiting, +diarrhea, hematochezia, melena, constipation, change in bowel habits GU: Denies dysuria, hematuria, polyuria, oliguria, urethral discharge Endocrine: Denies hot or cold intolerance, polyuria, polyphagia or appetite change Derm: Denies rash, dry skin, scaling or peeling skin change Heme: Denies easy bruising, bleeding, bleeding gums Neuro: Denies headache, numbness, weakness, slurred speech, loss of memory or consciousness    VITAL SIGNS: BP (!) 112/53   Pulse 69   Temp 99.3 F (37.4 C)   Resp (!) 24   Ht 5\' 5"  (1.651 m)   Wt 219 lb 2.2 oz (99.4 kg)   SpO2 95%   BMI 36.47 kg/m   HEMODYNAMICS: CVP:  [15 mmHg-18 mmHg] 15 mmHg  VENTILATOR SETTINGS: Vent Mode: PSV;CPAP FiO2 (%):  [30 %] 30 % PEEP:  [5 cmH20] 5 cmH20 Pressure Support:  [5 cmH20-10 cmH20] 5 cmH20  INTAKE / OUTPUT: I/O last 3 completed shifts: In:  4158.8 [P.O.:240; I.V.:3318.8; NG/GT:150; IV Piggyback:450] Out: 2305 [Urine:2305]  PHYSICAL EXAMINATION:  General - Obese female, laying in bed, on room air, eating breakfast, in no acute distress HEENT - Atraumatic, normocephalic, Neck supple, No JVD Cardiac - RRR, No M/R/G, palpable pulses throughout Chest - Slight expiratory wheeze in upper lobes bilaterally, all other lobes clear breath sounds, symmetrical expansion, no assessory muscle use Abd - Obese, soft, non-tender, BS+ x4 Ext - No edema, active ROM all extremities Skin - warm, dry. No obvious rashes, lesions, or ulcerations Neuro -  Awake & alert, oriented to self and place, follows commands, Pupils PERRL 3 mm  LABS:  BMET Recent Labs  Lab 03/29/18 0500 03/30/18 0430 03/31/18 0417  NA 139 136 143  K 3.1* 3.5 3.6  CL 107 99* 104  CO2 19* 22 26  BUN 24* 37* 38*  CREATININE 1.78* 1.88* 1.75*  GLUCOSE 145* 262* 113*    Electrolytes Recent Labs  Lab 03/29/18 0500  03/29/18 1620 03/30/18 0430 03/31/18 0417  CALCIUM 8.0*  --   --  8.0* 8.0*  MG 1.2*   < > 2.2 2.2 2.4  PHOS 2.6   < > 3.6 2.8 2.3*   < > = values in this interval not displayed.    CBC Recent Labs  Lab 03/29/18 0500 03/30/18 0430 03/31/18 0417  WBC 23.6* 19.2* 21.8*  HGB 12.5 11.1* 10.8*  HCT 38.4 33.7* 32.8*  PLT 165 124* 129*    Coag's No results for input(s): APTT, INR in the last 168 hours.  Sepsis Markers Recent Labs  Lab 03/29/18 0243 03/29/18 0428 03/29/18 0823 03/29/18 1226  03/30/18 0430 03/31/18 0417  LATICACIDVEN  --  3.0* 2.9* 2.6*  --   --   PROCALCITON >150.00  --   --   --  >150.00 109.41    ABG Recent Labs  Lab 03/28/18 2025 03/29/18 0533 03/29/18 0800  PHART 7.393 7.261* 7.359  PCO2ART 28.9* 41.5 33.6  PO2ART 121* 324* 119*    Liver Enzymes Recent Labs  Lab 03/28/18 2030  AST 34  ALT 10*  ALKPHOS 111  BILITOT 1.3*  ALBUMIN 3.4*    Cardiac Enzymes Recent Labs  Lab 03/29/18 0823  03/29/18 1539 03/29/18 2030  TROPONINI 0.92* 0.76* 0.83*    Glucose Recent Labs  Lab 03/30/18 1219 03/30/18 1552 03/30/18 2009 03/30/18 2323 03/31/18 0331 03/31/18 0724  GLUCAP 209* 95 115* 111* 112* 124*    Imaging No results found.   STUDIES:  Renal stone study 5/17 >> Scattered stones at the right renal pelvis and renal calyces, diffuse right-sided perinephric stranding and fluid, obstructing 5 mm stone at the distal right ureter, just above the right vesicoureteral junction, with mild right-sided hydronephrosis, 2.4 cm isodense lesion arising at the lower pole of the left kidney, increased in size from 2016; malignancy cannot be excluded  CULTURES: Blood 5/17 >> Urine 5/17 >> No growth   ANTIBIOTICS: Vancomycin 5/17 >>5/20 Zosyn 5/17 >>   SIGNIFICANT EVENTS: 5/17 Admit, cystoscopy with stent placement 5/18 septic shock, VDRF 5/19 Extubated  LINES/TUBES: ETT 5/18 >> Rt IJ CVL 5/18 >>  DISCUSSION: 67 yo female with septic shock, VDRF from nephrolithiasis with hydronephrosis and UTI.  Hemodynamics improved, and tolerating pressure support.  Extubated 03/30/18.  Vasopressors off 03/31/18.  ASSESSMENT / PLAN:  Septic shock>>improved - IVF: NS decreased to 30 ml/hr   Acute respiratory failure with hypoxia>>improved - Extubated 5/19 -CXR as needed -Supplemental O2 as needed to maintain O2 sats >92%  OSA. - CPAP qhs  Nephrolithiasis with hydronephrosis causing UTI. - Nephrology following, appreciate input - S/p right stent placement - Abx as above, day 4 - Will d/c Vancomycin 5/20, continue Zosyn; Can consider switching to Rocephin on 5/21  Acute renal failure from hydronephrosis>> improving 5/20 Hypokalemia, hypomagnesemia. Non gap metabolic acidosis >> resolved. - Follow I&O's - Follow BMP - Avoid nephrotoxic agents - Replace electrolytes as needed  Acute metabolic encephalopathy >> improved. Multisystem atrophy. -Resume home Sinemet & Lexapro -  Provide supportive care - Lights on during day  Relative adrenal insufficiency. - Continue Solu-cortef  DM. -CBG checks -SSI  DVT prophylaxis: Lovenox SUP: Pepcid Nutrition: Carb-Modified Diet Goals of care- Limited Code- Only Intubation & ACLS meds.  NO CPR  Will transfer pt to StepDown status, and can transfer service to [facility]   Darel Hong, AGACNP-BC [facility] 03/31/2018, 9:00 AM

## 2018-03-31 NOTE — Progress Notes (Signed)
3 Days Post-Op   Subjective/Chief Complaint:  1 - Right Ureteral / Renal Stones - w/p Rt JJ stent 5/18 for 47mm ureteral stone + scattered Rt renal steons (about 1cm total volume)  2 - Urosepsis- fevers, leukocytosis, bacteruria c/w primary urosepsis at presentation 03/28/18. Placed on Vanc + Zosyn, narrowed to Zosyn. [place], [place] 5/17 scant growth to date.  3 - Acute Renal Failure - Cr to 1.8s during admission for urosepsis / Rt stone 03/2018. Baseline Cr <1. She has been on vancomycin and pressors.   Today "[name] [name]" is improving, now off pressors x 24 hrs, less malaise. CX data thus far scant growth / non-clonal. She remians on Zosyn.   Objective: Vital signs in last 24 hours: Temp:  [98.4 F (36.9 C)-99.7 F (37.6 C)] 99.7 F (37.6 C) (05/20 1400) Pulse Rate:  [63-81] 63 (05/20 1400) Resp:  [15-29] 23 (05/20 1400) BP: (86-140)/(45-103) 140/72 (05/20 1400) SpO2:  [93 %-100 %] 96 % (05/20 1400) Weight:  [99.4 kg (219 lb 2.2 oz)] 99.4 kg (219 lb 2.2 oz) (05/20 0500) Last BM Date: 03/31/18  Intake/Output from previous day: 05/19 0701 - 05/20 0700 In: 2549.9 [P.O.:240; I.V.:1649.9; NG/GT:110; IV Piggyback:550] Out: 5809 [Urine:1820] Intake/Output this shift: Total I/O In: 50 [IV Piggyback:50] Out: -   General appearance: alert, cooperative and improved mentation and alertness, stable stigmata of advanced neurologic diseas.  Eyes: negative Neck: supple, symmetrical, trachea midline and Rt sided neck central line c/d/i. No site reaction / erythema.  Back: symmetric, no curvature. ROM normal. No CVA tenderness. Resp: non-labored on [place] O2 Cardio: Nl rate by bedside monitor Foley in place with copious medium yellow urien.   Lab Results:  Recent Labs    03/30/18 0430 03/31/18 0417  WBC 19.2* 21.8*  HGB 11.1* 10.8*  HCT 33.7* 32.8*  PLT 124* 129*   BMET Recent Labs    03/30/18 0430 03/31/18 0417  NA 136 143  K 3.5 3.6  CL 99* 104  CO2 22 26  GLUCOSE 262* 113*  BUN 37*  38*  CREATININE 1.88* 1.75*  CALCIUM 8.0* 8.0*   PT/INR No results for input(s): LABPROT, INR in the last 72 hours. ABG Recent Labs    03/29/18 0533 03/29/18 0800  PHART 7.261* 7.359  HCO3 18.1* 18.5*    Studies/Results: Portable Chest Xray  Result Date: 03/30/2018 CLINICAL DATA:  Acute respiratory failure with hypoxia. EXAM: PORTABLE CHEST 1 VIEW COMPARISON:  03/29/2018 FINDINGS: Endotracheal tube with tip 2.6 cm above the carina. Nasogastric tube courses into the region of the stomach and off the inferior portion of the film as tip is not visualized. Right IJ central venous catheter has tip over the SVC. Lungs are hypoinflated with subtle left basilar/retrocardiac opacification which may be due to atelectasis or infection. Cardiomediastinal silhouette and remainder of the exam is unchanged. IMPRESSION: Minimal left base opacification which may be due to atelectasis or infection. Tubes and lines as described. Electronically Signed   By: Marin Olp M.D.   On: 03/30/2018 07:11    Anti-infectives: Anti-infectives (From admission, onward)   Start     Dose/Rate Route Frequency Ordered Stop   03/31/18 2000  vancomycin (VANCOCIN) 1,250 mg in sodium chloride 0.9 % 250 mL IVPB  Status:  Discontinued     1,250 mg 166.7 mL/hr over 90 Minutes Intravenous Every 48 hours 03/30/18 1249 03/31/18 1002   03/30/18 0800  vancomycin (VANCOCIN) IVPB 1000 mg/200 mL premix  Status:  Discontinued     1,000 mg 200  mL/hr over 60 Minutes Intravenous Every 36 hours 03/29/18 0310 03/30/18 1249   03/29/18 0200  piperacillin-tazobactam (ZOSYN) IVPB 3.375 g     3.375 g 12.5 mL/hr over 240 Minutes Intravenous Every 8 hours 03/29/18 0159     03/28/18 2015  vancomycin (VANCOCIN) 2,000 mg in sodium chloride 0.9 % 500 mL IVPB     2,000 mg 250 mL/hr over 120 Minutes Intravenous  Once 03/28/18 2003 03/29/18 0250   03/28/18 2015  piperacillin-tazobactam (ZOSYN) IVPB 3.375 g     3.375 g 100 mL/hr over 30 Minutes  Intravenous  Once 03/28/18 2003 03/28/18 2111      Assessment/Plan:  1 - Right Ureteral / Renal Stones - now temporized with stenting. Rec ureteroscopy in elective setting after clears infectious paramaters and functional status at baseline. No further surgical intervention this admission.   2 - Urosepsis- improving on current empiric Zosyn, unfortunately no CX data to further guide. Agree with IV ABX until afebrile x 24 hra and leukocytosis clearly trending down.   3 - Acute Renal Failure - likely some pre-renal from necessary pressor use and some nephrotoxicity from recent vancomycin. This will likely improve. I feel OK to DC foley as UOP good and she is having diarrhea.   Will follow, please call me directly with questions any time.    [facility], [place] No 03/31/2018

## 2018-03-31 NOTE — Progress Notes (Signed)
Noted to have loose stools.  No fever.  Not having abdominal pain.  Will add prn imodium.  Chesley Mires, MD [facility] Pulmonary/Critical Care 03/31/2018, 2:55 PM

## 2018-04-01 ENCOUNTER — Encounter (HOSPITAL_COMMUNITY): Payer: Self-pay | Admitting: Cardiology

## 2018-04-01 ENCOUNTER — Other Ambulatory Visit (HOSPITAL_COMMUNITY): Payer: PPO

## 2018-04-01 DIAGNOSIS — N12 Tubulo-interstitial nephritis, not specified as acute or chronic: Secondary | ICD-10-CM

## 2018-04-01 DIAGNOSIS — N390 Urinary tract infection, site not specified: Secondary | ICD-10-CM | POA: Diagnosis present

## 2018-04-01 DIAGNOSIS — N10 Acute tubulo-interstitial nephritis: Secondary | ICD-10-CM

## 2018-04-01 DIAGNOSIS — N201 Calculus of ureter: Secondary | ICD-10-CM | POA: Diagnosis present

## 2018-04-01 DIAGNOSIS — J9601 Acute respiratory failure with hypoxia: Secondary | ICD-10-CM

## 2018-04-01 DIAGNOSIS — A419 Sepsis, unspecified organism: Secondary | ICD-10-CM

## 2018-04-01 DIAGNOSIS — I5031 Acute diastolic (congestive) heart failure: Secondary | ICD-10-CM

## 2018-04-01 DIAGNOSIS — R6520 Severe sepsis without septic shock: Secondary | ICD-10-CM

## 2018-04-01 DIAGNOSIS — R001 Bradycardia, unspecified: Secondary | ICD-10-CM

## 2018-04-01 DIAGNOSIS — N179 Acute kidney failure, unspecified: Secondary | ICD-10-CM | POA: Diagnosis present

## 2018-04-01 LAB — GLUCOSE, CAPILLARY
GLUCOSE-CAPILLARY: 129 mg/dL — AB (ref 65–99)
GLUCOSE-CAPILLARY: 135 mg/dL — AB (ref 65–99)
GLUCOSE-CAPILLARY: 158 mg/dL — AB (ref 65–99)
GLUCOSE-CAPILLARY: 195 mg/dL — AB (ref 65–99)

## 2018-04-01 LAB — BASIC METABOLIC PANEL
ANION GAP: 11 (ref 5–15)
BUN: 39 mg/dL — ABNORMAL HIGH (ref 6–20)
CALCIUM: 8.8 mg/dL — AB (ref 8.9–10.3)
CO2: 27 mmol/L (ref 22–32)
Chloride: 108 mmol/L (ref 101–111)
Creatinine, Ser: 1.61 mg/dL — ABNORMAL HIGH (ref 0.44–1.00)
GFR calc non Af Amer: 32 mL/min — ABNORMAL LOW (ref >60)
GFR, EST AFRICAN AMERICAN: 37 mL/min — AB (ref >60)
Glucose, Bld: 145 mg/dL — ABNORMAL HIGH (ref 65–99)
Potassium: 3.2 mmol/L — ABNORMAL LOW (ref 3.5–5.1)
SODIUM: 146 mmol/L — AB (ref 135–145)

## 2018-04-01 LAB — CBC
HCT: 32.5 % — ABNORMAL LOW (ref 36.0–46.0)
HEMOGLOBIN: 10.6 g/dL — AB (ref 12.0–15.0)
MCH: 29 pg (ref 26.0–34.0)
MCHC: 32.6 g/dL (ref 30.0–36.0)
MCV: 89 fL (ref 78.0–100.0)
Platelets: 121 10*3/uL — ABNORMAL LOW (ref 150–400)
RBC: 3.65 MIL/uL — AB (ref 3.87–5.11)
RDW: 14.8 % (ref 11.5–15.5)
WBC: 14.5 10*3/uL — AB (ref 4.0–10.5)

## 2018-04-01 LAB — TROPONIN I
TROPONIN I: 0.15 ng/mL — AB (ref <0.03)
Troponin I: 0.18 ng/mL (ref <0.03)
Troponin I: 0.17 ng/mL (ref <0.03)

## 2018-04-01 MED ORDER — POTASSIUM CHLORIDE CRYS ER 20 MEQ PO TBCR
40.0000 meq | EXTENDED_RELEASE_TABLET | Freq: Once | ORAL | Status: AC
  Administered 2018-04-01: 40 meq via ORAL
  Filled 2018-04-01: qty 2

## 2018-04-01 MED ORDER — SODIUM CHLORIDE 0.45 % IV SOLN
INTRAVENOUS | Status: DC
  Filled 2018-04-01: qty 1000

## 2018-04-01 MED ORDER — FUROSEMIDE 10 MG/ML IJ SOLN
40.0000 mg | Freq: Two times a day (BID) | INTRAMUSCULAR | Status: DC
  Administered 2018-04-01 – 2018-04-02 (×4): 40 mg via INTRAVENOUS
  Filled 2018-04-01 (×4): qty 4

## 2018-04-01 MED ORDER — SODIUM CHLORIDE 0.9 % IV SOLN
2.0000 g | INTRAVENOUS | Status: DC
  Administered 2018-04-01 – 2018-04-02 (×2): 2 g via INTRAVENOUS
  Filled 2018-04-01 (×2): qty 2
  Filled 2018-04-01: qty 20

## 2018-04-01 MED ORDER — HYDROCORTISONE NA SUCCINATE PF 100 MG IJ SOLR
25.0000 mg | Freq: Three times a day (TID) | INTRAMUSCULAR | Status: DC
  Administered 2018-04-02: 25 mg via INTRAVENOUS
  Filled 2018-04-01: qty 2

## 2018-04-01 MED ORDER — POTASSIUM CHLORIDE CRYS ER 20 MEQ PO TBCR: 40.0000 meq | EXTENDED_RELEASE_TABLET | ORAL | Status: DC

## 2018-04-01 MED ORDER — SODIUM CHLORIDE 0.45 % IV SOLN
INTRAVENOUS | Status: DC
  Administered 2018-04-01: 10:00:00 via INTRAVENOUS

## 2018-04-01 NOTE — Consult Note (Addendum)
Cardiology Consultation:   Patient ID: [name] [name]; [id number]; 05/31/1951   Admit date: 03/28/2018 Date of Consult: 04/01/2018  Primary Care Provider: [name], MD Primary Cardiologist: [facility]  Patient Profile:   [name] [name] is a 67 y.o. 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 Grandville [name].  History of Present Illness:   [name] [name] is a 68 year old female with a history stated above who initially presented to [facility] on 03/28/2018 after being found by family member after an episode of unresponsiveness and altered mental status.  On day of admission, patient was seen by PCP secondary to worsening back pain.  She was diagnosed with UTI/pyelonephritis and was recommended to come to the emergency department for further evaluation.  Instead, patient proceeded home.  After taking 1 dose of prescribed Cipro, family member found patient to be minimally responsive.  EMS was called for transport to the ED for further evaluation.  EMS reports BP stable at 109/64 with a heart rate of 154 bpm.  She was however, tachypneic and diaphoretic. Patient has denies personal history of arrhythmias or CAD, has hx of MI in father.  Denies tobacco or alcohol use.   In the ED, patient was found to be leukopenic with a lactic acid elevated at 6.2.  Sepsis protocol was initiated in which she received vancomycin and Zosyn and was given 3 L normal saline fluid resuscitation.  Her temperature was noted to be elevated to 104 F.  CT scan performed revealed right obstructive stone with perinephric stranding and air in collecting system suspicious for emphysematous pyelonephritis and pyohydronephrosis.  Nephrology was consulted and she was taken to the OR on 03/29/2018 for stent placement secondary to hydronephrosis with septic shock.  Care  was transitioned from internal medicine to pulmonary critical care service given the need for intubation and pressure support.  She now been extubated however, remains on norepinephrine secondary to recurrent hypotension.  Cardiology was consulted secondary to frequent 2-2.5-second pauses per telemetry review and intermittent sinus bradycardia.  Past Medical History:  Diagnosis Date  . Arthritis    fingers,right shoulder  . 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 Chronic lumbar radiculopathy    L5- S1  right leg  . Depression   . Dizziness   . Frequency of urination   . Hard of hearing   . History of kidney stones 05/12/15   surgery   . HTN (hypertension)   . Hyperlipidemia   . 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 Numbness and tingling in hands   . OSA (obstructive sleep apnea)    moderate OSA per study 11-22-2007--  refused CPAP but used oxygen for 6 months at night,  states due to wt loss stopped using oxygen  . Polyneuropathy, diabetic ([place])    WALKS W/ CANE FOR BALANCE  . Right ureteral stone   . Shortness of breath dyspnea   . SUI (stress urinary incontinence, female)   . Type 2 diabetes mellitus (HCC)    Type II - Diet controlled  . Urinary incontinence   . Wears glasses     Past Surgical History:  Procedure Laterality Date  . ANTERIOR CERVICAL DECOMP/DISCECTOMY FUSION N/A 03/23/2016   Procedure: Cervical Four-Five, Cervical Five-Six,  Cervical Six-Seven Anterior cervical decompression/diskectomy/fusion;  Surgeon: Leeroy Cha, MD;  Location: [facility];  Service: Neurosurgery;  Laterality: N/A;  C4-5 C5-6 C6-7 Anterior cervical decompression/diskectomy/fusion  . CARDIOVASCULAR STRESS TEST  09-30-2007     normal Adenosine study/  no ischemia /  normal LV function and wall motion , ef 82%  . COLONOSCOPY    . CYSTOSCOPY WITH RETROGRADE PYELOGRAM, URETEROSCOPY AND STENT PLACEMENT Right 05/12/2015    Procedure: CYSTOSCOPY WITH RETROGRADE PYELOGRAM, URETEROSCOPY,STONE EXTRACTION AND STENT PLACEMENT;  Surgeon: Franchot Gallo, MD;  Location: [facility];  Service: Urology;  Laterality: Right;  . CYSTOSCOPY WITH STENT PLACEMENT Right 03/28/2018   Procedure: CYSTOSCOPY WITH STENT PLACEMENT retrograde pylegram;  Surgeon: Alexis Frock, MD;  Location: [facility];  Service: Urology;  Laterality: Right;  . EXTRACORPOREAL SHOCK WAVE LITHOTRIPSY Right 08-18-2012  . HOLMIUM LASER APPLICATION Right 2/70/3500   Procedure: HOLMIUM LASER APPLICATION;  Surgeon: Franchot Gallo, MD;  Location: [facility];  Service: Urology;  Laterality: Right;  . ORIF HUMERUS FRACTURE Right 07/20/2016   Procedure: OPEN REDUCTION INTERNAL FIXATION (ORIF) PROXIMAL HUMERUS FRACTURE VS REVERSE TOTAL SHOULDER ARTHROPLASTY;  Surgeon: Netta Cedars, MD;  Location: [facility];  Service: Orthopedics;  Laterality: Right;  . SHOULDER ARTHROSCOPY Right 07/2016  . TRANSTHORACIC ECHOCARDIOGRAM  09-23-2007   pseudonormal LV filling pattern,  ef 65-70%/  trivial AR/  mild LAE  . TUBAL LIGATION  1985     Prior to Admission medications   Medication Sig Start Date End Date Taking? Authorizing Provider  aspirin 81 MG tablet Take 81 mg by mouth at bedtime.    Yes [provider]  azelastine (ASTELIN) 0.1 % nasal spray Place 1 spray into both nostrils 2 (two) times daily.  12/23/14  Yes [provider]  carbidopa-levodopa (SINEMET IR) 25-100 MG tablet 2 tablets TID Patient taking differently: Take 3 tablets by mouth at bedtime. 2 tablets TID 02/07/18  Yes Tat, Eustace Quail, DO  Cholecalciferol (VITAMIN D PO) Take 5,000 Units by mouth daily.   Yes [provider]  cyanocobalamin 500 MCG tablet Take 500 mcg by mouth daily.   Yes [provider]  escitalopram (LEXAPRO) 10 MG tablet Take 15 mg at bedtime by mouth.  06/08/16  Yes [provider]  fenofibrate 160 MG tablet Take 160 mg by  mouth every morning.    Yes [provider]  fluticasone (FLONASE) 50 MCG/ACT nasal spray Place 2 sprays into both nostrils daily.  12/23/14  Yes [provider]  loratadine (CLARITIN) 10 MG tablet Take 10 mg by mouth. Taking every other day.   Yes [provider]  Melatonin 5 MG CAPS Take 1 capsule by mouth at bedtime as needed. For sleep   Yes [provider]  metFORMIN (GLUCOPHAGE) 500 MG tablet Take 250 mg by mouth 2 (two) times daily with a meal.    Yes [provider]  montelukast (SINGULAIR) 10 MG tablet Take 10 mg by mouth at bedtime.   Yes [provider]  nystatin cream (MYCOSTATIN) Apply 1 application topically 2 (two) times daily as needed for dry skin.   Yes [provider]  olmesartan (BENICAR) 40 MG tablet Take 40 mg by mouth daily. 02/21/18  Yes [provider]  Propylene Glycol (SYSTANE BALANCE OP) Place 2 drops into both eyes 2 (two) times daily as needed (dry eyes).    Yes [provider]  ranitidine (ZANTAC) 150 MG tablet Take 150 mg by mouth 2 (two) times  daily.   Yes [provider]  Menthol, Topical Analgesic, (BIOFREEZE) 4 % GEL Apply topically.    [provider]    Inpatient Medications: Scheduled Meds: . azelastine  1 spray Each Nare BID  . carbidopa-levodopa  2 tablet Oral TID  . enoxaparin (LOVENOX) injection  40 mg Subcutaneous Daily  . escitalopram  15 mg Oral QHS  . famotidine  20 mg Oral BID  . fluticasone  2 spray Each Nare Daily  . hydrocortisone sod succinate (SOLU-CORTEF) inj  25 mg Intravenous Q6H  . insulin aspart  0-15 Units Subcutaneous TID WC  . insulin aspart  0-5 Units Subcutaneous QHS  . mouth rinse  15 mL Mouth Rinse BID  . montelukast  10 mg Oral QHS   Continuous Infusions: . sodium chloride 100 mL/hr at 04/01/18 0934  . piperacillin-tazobactam (ZOSYN)  IV 3.375 g (04/01/18 0618)   PRN Meds: acetaminophen (TYLENOL) oral liquid 160 mg/5 mL  **OR** acetaminophen, fentaNYL (SUBLIMAZE) injection, loperamide, [DISCONTINUED] ondansetron **OR** ondansetron (ZOFRAN) IV  Allergies:    Allergies  Allergen Reactions  . Aleve [Naproxen] Hives, Shortness Of Breath and Rash    Pt Avoids All Nsaids  . Zocor [Simvastatin] Other (See Comments)    "weird feeling all over body"    Social History:   Social History   Socioeconomic History  . Marital status: Widowed    Spouse name: Not on file  . Number of children: 1  . Years of education: College  . Highest education level: Not on file  Occupational History  . Occupation: Therapist, sports: [employer]  . Financial resource strain: Not on file  . Food insecurity:    Worry: Not on file    Inability: Not on file  . Transportation needs:    Medical: Not on file    Non-medical: Not on file  Tobacco Use  . Smoking status: Never Smoker  . Smokeless tobacco: Never Used  Substance and Sexual Activity  . Alcohol use: Yes    Alcohol/week: 0.0 oz    Comment: 3- 4 times a year  . Drug use: No  . Sexual activity: Not on file  Lifestyle  . Physical activity:    Days per week: Not on file    Minutes per session: Not on file  . Stress: Not on file  Relationships  . Social connections:    Talks on phone: Not on file    Gets together: Not on file    Attends religious service: Not on file    Active member of club or organization: Not on file    Attends meetings of clubs or organizations: Not on file    Relationship status: Not on file  . Intimate partner violence:    Fear of current or ex partner: Not on file    Emotionally abused: Not on file    Physically abused: Not on file    Forced sexual activity: Not on file  Other Topics Concern  . Not on file  Social History Narrative   Patient lives at home with her dog name "Hot Rod".   Caffeine Use: 2-3 cups of coffee a day     Family History:   Family History  Problem Relation Age of Onset  . Heart  Problems Father   . Stroke Mother   . Hypertension Unknown   . Stroke Unknown   . Breast cancer Sister    Family Status:  Family Status  Relation  Name Status  . Father  Deceased at age 38       heart disease  . Mother  Deceased at age 33       stroke  . Sister  Alive       asthma  . Sister  Deceased       breast cancer  . Sister  Deceased       breast cancer  . Unknown  (Not Specified)  . Sister  (Not Specified)    ROS:  Please see the history of present illness.  All other ROS reviewed and negative.     Physical Exam/Data:   Vitals:   04/01/18 0000 04/01/18 0308 04/01/18 0400 04/01/18 0800  BP: (!) 127/57  (!) 159/75 (!) 165/76  Pulse: (!) 55  (!) 58 (!) 55  Resp: (!) 28  (!) 28 (!) 31  Temp:  98.8 F (37.1 C)  99.2 F (37.3 C)  TempSrc:  Axillary  Axillary  SpO2: 99%  97% 97%  Weight:  227 lb 11.8 oz (103.3 kg)    Height:        Intake/Output Summary (Last 24 hours) at 04/01/2018 0950 Last data filed at 04/01/2018 0800 Gross per 24 hour  Intake 2389.5 ml  Output -  Net 2389.5 ml   Filed Weights   03/30/18 0500 03/31/18 0500 04/01/18 0308  Weight: 221 lb 9 oz (100.5 kg) 219 lb 2.2 oz (99.4 kg) 227 lb 11.8 oz (103.3 kg)   Body mass index is 37.9 kg/m.   General: Overweight, NAD Skin: Warm, dry, intact  Head: Normocephalic, atraumatic, clear, moist mucus membranes. Neck: Negative for carotid bruits. No JVD Lungs:Clear to ausculation bilaterally. No wheezes, rales, or rhonchi. Breathing is unlabored. Cardiovascular: RRR with S1 S2. No murmurs, rubs or gallops Abdomen: Soft, non-tender, non-distended with normoactive bowel sounds. No obvious abdominal masses. MSK: Strength and tone appear normal for age. 5/5 in all extremities Extremities: No edema. No clubbing or cyanosis. DP/PT pulses 2+ bilaterally Neuro: Alert and oriented, slow to respond secondary to progressive supranuclear palsy. No focal deficits. No facial asymmetry. MAE spontaneously. Psych:  Responds slowly to questions appropriately with normal affect.     EKG:  The EKG was personally reviewed and demonstrates: 04/01/2018 SB HR 38 with T wave inversions in leads III, aVF and V1, V2 and V4. No other acute ischemic ST-T wave changes Telemetry:  Telemetry was personally reviewed and demonstrates: 04/01/2017 NSR/SB HR ranging from mid 50s down to upper 30s.  Intermittent pausing, 2-2.5 seconds in duration per telemetry review  Relevant CV Studies:  ECHO: 04/01/2018, pending  CATH: Rest adenosine Myoview stress test 09/30/2007 which was normal with no ischemic ST T wave changes  Laboratory Data:  Chemistry Recent Labs  Lab 03/30/18 0430 03/31/18 0417 04/01/18 0316  NA 136 143 146*  K 3.5 3.6 3.2*  CL 99* 104 108  CO2 22 26 27   GLUCOSE 262* 113* 145*  BUN 37* 38* 39*  CREATININE 1.88* 1.75* 1.61*  CALCIUM 8.0* 8.0* 8.8*  GFRNONAA 27* 29* 32*  GFRAA 31* 34* 37*  ANIONGAP 15 13.0 11    Total Protein  Date Value Ref Range Status  03/28/2018 6.8 6.5 - 8.1 g/dL Final   Albumin  Date Value Ref Range Status  03/28/2018 3.4 (L) 3.5 - 5.0 g/dL Final   AST  Date Value Ref Range Status  03/28/2018 34 15 - 41 U/L Final   ALT  Date Value Ref Range Status  03/28/2018 10 (  L) 14 - 54 U/L Final   Alkaline Phosphatase  Date Value Ref Range Status  03/28/2018 111 38 - 126 U/L Final   Total Bilirubin  Date Value Ref Range Status  03/28/2018 1.3 (H) 0.3 - 1.2 mg/dL Final   Hematology Recent Labs  Lab 03/30/18 0430 03/31/18 0417 04/01/18 0316  WBC 19.2* 21.8* 14.5*  RBC 3.83* 3.70* 3.65*  HGB 11.1* 10.8* 10.6*  HCT 33.7* 32.8* 32.5*  MCV 88.0 88.6 89.0  MCH 29.0 29.2 29.0  MCHC 32.9 32.9 32.6  RDW 14.7 14.6 14.8  PLT 124* 129* 121*   Cardiac Enzymes Recent Labs  Lab 03/29/18 0203 03/29/18 0243 03/29/18 0823 03/29/18 1539 03/29/18 2030  TROPONINI 0.65* 0.97* 0.92* 0.76* 0.83*    Recent Labs  Lab 03/28/18 2038  TROPIPOC 0.10*    BNPNo results for  input(s): BNP, PROBNP in the last 168 hours.  DDimer No results for input(s): DDIMER in the last 168 hours. TSH:  Lab Results  Component Value Date   TSH 0.989 03/28/2018   Lipids:No results found for: CHOL, HDL, LDLCALC, LDLDIRECT, TRIG, CHOLHDL HgbA1c: Lab Results  Component Value Date   HGBA1C 6.4 (H) 03/15/2016    Radiology/Studies:  Dg Chest 1 View  Result Date: 03/29/2018 CLINICAL DATA:  Endotracheal tube and central line placement. EXAM: CHEST  1 VIEW COMPARISON:  Chest radiograph performed 03/28/2018 FINDINGS: The patient's endotracheal tube is seen ending 1-2 cm above the carina. An enteric tube is noted extending below the diaphragm, with the side port at the distal esophagus. This could be advanced approximately 5 cm. A right IJ line is noted ending about the distal SVC. The lungs are hypoexpanded. Increased interstitial markings may reflect mild interstitial edema. No definite pleural effusion or pneumothorax is seen. The cardiomediastinal silhouette is mildly enlarged. No acute osseous abnormalities are seen. The patient's right glenohumeral joint arthroplasty is partially characterized and appears grossly unremarkable. IMPRESSION: 1. Endotracheal tube seen ending 1 - 2 cm above the carina. 2. Enteric tube noted extending below the diaphragm, with the side port at the distal esophagus. This could be advanced approximately 5 cm, as deemed clinically appropriate. 3. Lungs hypoexpanded. Increased interstitial markings may reflect mild interstitial edema. 4. No pneumothorax. Electronically Signed   By: Garald Balding M.D.   On: 03/29/2018 05:22   Dg Abd 1 View  Result Date: 03/28/2018 CLINICAL DATA:  Right flank pain EXAM: ABDOMEN - 1 VIEW COMPARISON:  04/15/2017 FINDINGS: Negative for urinary tract calculi. Normal bowel gas pattern. Lumbar levoscoliosis with degenerative change. IMPRESSION: Negative for renal calculi. Electronically Signed   By: Franchot Gallo M.D.   On: 03/28/2018  16:33   Portable Chest Xray  Result Date: 03/30/2018 CLINICAL DATA:  Acute respiratory failure with hypoxia. EXAM: PORTABLE CHEST 1 VIEW COMPARISON:  03/29/2018 FINDINGS: Endotracheal tube with tip 2.6 cm above the carina. Nasogastric tube courses into the region of the stomach and off the inferior portion of the film as tip is not visualized. Right IJ central venous catheter has tip over the SVC. Lungs are hypoinflated with subtle left basilar/retrocardiac opacification which may be due to atelectasis or infection. Cardiomediastinal silhouette and remainder of the exam is unchanged. IMPRESSION: Minimal left base opacification which may be due to atelectasis or infection. Tubes and lines as described. Electronically Signed   By: Marin Olp M.D.   On: 03/30/2018 07:11   Dg Chest Port 1 View  Result Date: 03/28/2018 CLINICAL DATA:  Sepsis, fever, short of breath EXAM:  PORTABLE CHEST 1 VIEW COMPARISON:  Chest x-ray dated 12/19/2017. FINDINGS: New interstitial and/or micronodular opacities at the LEFT lung base, suspicious for edema or pneumonia. No pleural effusion or pneumothorax seen. Heart size and mediastinal contours are stable. No acute or suspicious osseous finding. IMPRESSION: New subtle interstitial and/or micronodular opacities at the LEFT lung base, suspicious for interstitial edema and/or pneumonia. Electronically Signed   By: Franki Cabot M.D.   On: 03/28/2018 20:19   Dg C-arm 1-60 Min-no Report  Result Date: 03/29/2018 Fluoroscopy was utilized by the requesting physician.  No radiographic interpretation.   Ct Renal Stone Study  Result Date: 03/28/2018 CLINICAL DATA:  Patient found unresponsive. Elevated respiratory rate, heart rate and fever. Decreased level of consciousness. EXAM: CT ABDOMEN AND PELVIS WITHOUT CONTRAST TECHNIQUE: Multidetector CT imaging of the abdomen and pelvis was performed following the standard protocol without IV contrast. COMPARISON:  Abdominal radiograph  performed earlier today at 12:16 p.m., and CT of the abdomen and pelvis performed 04/28/2015 FINDINGS: Lower chest: Minimal bibasilar atelectasis is noted. The visualized portions of the mediastinum are unremarkable. Hepatobiliary: The liver is unremarkable in appearance. The gallbladder is unremarkable in appearance. The common bile duct remains normal in caliber. Pancreas: The pancreas is within normal limits. Spleen: The spleen is unremarkable in appearance. Adrenals/Urinary Tract: The adrenal glands are unremarkable in appearance. Scattered stones noted at the right renal pelvis and renal calyces, measuring up to 6 mm in size. Associated scattered air is noted at the right renal pelvis and right renal calyces. Would correlate clinically for evidence of emphysematous pyelonephritis. There appears to be an obstructing 5 mm stone at the distal right ureter, just above the right vesicoureteral junction, with mild right-sided hydronephrosis. Diffuse right-sided perinephric stranding and fluid are seen. Bilateral renal cysts are noted. A 2.4 cm isodense lesion is noted arising at the lower pole of the left kidney, increased in size from 2016. Malignancy cannot be excluded. Stomach/Bowel: The stomach is unremarkable in appearance. The small bowel is within normal limits. The appendix is normal in caliber, without evidence of appendicitis. Scattered diverticulosis is noted along the descending colon, without evidence of diverticulitis. Vascular/Lymphatic: Minimal calcification is seen along the abdominal aorta. No retroperitoneal or pelvic sidewall lymphadenopathy is seen. Reproductive: The bladder is decompressed, with a Foley catheter in place. Diffuse bladder wall thickening may reflect chronic inflammation. The uterus is grossly unremarkable in appearance. No suspicious adnexal masses are seen. Other: No additional soft tissue abnormalities are seen. Musculoskeletal: No acute osseous abnormalities are identified.  Mild multilevel vacuum phenomenon is noted along the lumbar spine. The visualized musculature is unremarkable in appearance. IMPRESSION: 1. Scattered stones at the right renal pelvis and renal calyces, measuring up to 6 mm in size. Associated scattered air noted at the right renal pelvis and right renal calyces. Diffuse right-sided perinephric stranding and fluid. Would correlate clinically for evidence of emphysematous pyelonephritis. 2. Obstructing 5 mm stone at the distal right ureter, just above the right vesicoureteral junction, with mild right-sided hydronephrosis. 3. 2.4 cm isodense lesion arising at the lower pole of the left kidney, increased in size from 2016. The malignancy cannot be excluded. Contrast-enhanced MRI or CT would be helpful for further evaluation, when and as deemed clinically appropriate. 4. Diffuse bladder wall thickening may reflect chronic inflammation. 5. Scattered diverticulosis along the descending colon, without evidence of diverticulitis. Electronically Signed   By: Garald Balding M.D.   On: 03/28/2018 22:07   Assessment and Plan:   1.  Sinus bradycardia with intermittent pausing: -Cardiology consulted secondary to intermittent 2 to 2.5 second pauses on telemetry she began overnight.  Patient is currently asymptomatic with pausing. She remains in NSR, mainly with rates in the mid 50s. -EKG with SB HR 38 -She is on no AV blocking agents, per RN pharmacy has reviewed her medication list in which there are no interfering medications -Patient initially was admitted for severe sepsis secondary to severe UTI complicated by hydronephrosis now status post stent per nephrology.  She was intubated and on pressors until 03/30/2018 in which she is now stabilized.  White count remains elevated at 18.5, improved from 21.8 yesterday.  She continues to have mild low-grade temperature, most recent today 04/09/2018 at 99.2. -Mentation improving however remains slow, likely secondary to  palsy -At this point, given that she is asymptomatic with S/P and intermittent pausing, would monitor telemetry closely and avoid AV blocking agents.  It does not appear that she is close to discharge today at this point.  If not resolved, could consider outpatient monitoring.  Likely not a PPM candidate secondary to recent sepsis and currently not indicated  2.  Severe sepsis/septic shock secondary to pyohydronephrosis of right kidney: -Patient presented to [facility] on 03/28/2018 with altered mental status after receiving diagnosis of UTI.  Found to have right distal urethral stone with hydronephrosis.  She is now s/p stenting per urology. -Currently resolving, per pulmonary/critical care medicine. Now off vent and pressors (03/30/18).  White count remains elevated at 14.5 however, improved from yesterday.  Continues to have low-grade temp. -Antibiotic therapy per primary team  3.  DM2: -SSI per primary team, last hemoglobin A1c 6.4  4.  OSA: -CPAP QHS  5.  AKI: -Improving>>creatinine, 1.61 today  -Peak creatinine this admission 1.88.  Recent baseline appears to be in the 0.5 range  6.  Progressive supranuclear palsy:  -Stable, followed by outpatient neurology. Patient with baseline verbal slurring   For questions or updates, please contact [facility] Please consult [email or web address] for contact info under Cardiology/STEMI.   Lyndel Safe NP-C [place] Pager: [phone] 04/01/2018 9:50 AM  Patient seen, examined. Available data reviewed. Agree with findings, assessment, and plan as outlined by Kathyrn Drown, NP.  Asked to see this patient because of bradycardia and elevated troponin.  The patient is admitted with severe sepsis and shock.  She has been critically ill requiring mechanical ventilation and vasopressor support.  She is now improving.  On my examination, she is awake and alert, in no distress.  She does complain of shortness of breath.  She denies chest pain.  The  patient is obese.  JVP is mildly elevated.  Lung fields are coarse bilaterally with scattered rhonchi.  Heart is regular rate and rhythm without murmur or gallop.  Abdomen is soft and obese.  Extremities have diffuse 1+ edema.  Twelve-lead EKG reveals marked sinus bradycardia 38 bpm, left axis deviation, otherwise within normal limits.  Review of telemetry demonstrates normal sinus rhythm with episodes of sinus bradycardia and sinus pauses up to 2.5 seconds.  The patient has not had hemodynamic instability associated with her bradycardia.  Supportive care is indicated.  She is not on any AV nodal blocking agents.  There is no evidence of high-grade AV block.  Continue to monitor her on telemetry and there are no specific interventions recommended at this time.  Regarding her mildly elevated troponin, this appears to be secondary to demand ischemia.  There is no indication of ACS in this clinical  setting.  Await 2D echocardiogram today.    The patient does appear to have evidence of volume overload on exam.  Her weight is up significantly and she is 14 L positive for this admission because of need for resuscitation in the setting of sepsis.  I would recommend starting IV diuretic therapy to begin diuresis for treatment of acute, presumably diastolic heart failure/volume overload.   Sherren Mocha, M.D. 04/01/2018 11:42 AM

## 2018-04-01 NOTE — Progress Notes (Signed)
PROGRESS NOTE    [name] [name]  ERX:[id number] DOB: [birth year] DOA: 03/28/2018 PCP: [name], MD   Brief Narrative:  [phone] with hx DM, OSA, HTN, Depression, OA, Multiple system atrophy, and Nephrolithiasis, presented to the ER on 5/17 after she was found unresponsive by her family. Prior to being found unresponsive, she was seen by her PCP and diagnosed with a UTI. She was sent home with a prescription for ciprofloxacin. She took 1 dose of the cipro after which family came home and found her minimally responsive. EMS was called and found patient to be hypotensive and tachycardic. She was brought to the ER where she was found to have a temperature of 104 and was found to be in septic shock due to obstructive uropathy. CT Abdomen revealed pyelonephritis and a 22mm obstructive stone in the right ureter. She was taken to the OR where Urology placed a ureteral stent. She returned to the ICU and was requiring vasopressors, so PCCM was consulted. Patient noted to have worsening hypoxia and increased work of breathing and subsequently intubated.  Patient placed empirically on IV antibiotics and pancultured.  Patient subsequently extubated 03/30/2018.  Pressors subsequently discontinued 03/31/2018.  [facility] transferred to [facility] 04/01/2018.     Assessment & Plan:   Principal Problem:   Severe sepsis with septic shock (HCC) Active Problems:   Diabetes type 2, uncontrolled (HCC)   OSA on CPAP   PSP (progressive supranuclear palsy) (HCC)   Hypokalemia   Elevated lactic acid level   Elevated troponin   Pyohydronephrosis   Septic shock (HCC)   Bradycardia   UTI (urinary tract infection)   ARF (acute renal failure) (HCC)   Right ureteral stone   Acute respiratory failure with hypoxia ([place])   Pyelonephritis   Severe sepsis ([place])  #1 severe sepsis with septic shock secondary to urosepsis/pyelonephritis/UTI Patient currently off pressors with improvement with blood pressure.   Patient pancultured with blood cultures with no growth to date.  Urine cultures negative.  Patient status post JJ stent placement per urology 03/29/2018.  Patient was on IV vancomycin and IV Zosyn and currently just on IV Zosyn.  Narrow IV antibiotics to IV Rocephin.  Start to taper stress dose IV steroids.  Follow.  2.  Status post vent dependent respiratory failure/acute respiratory failure with hypoxia Patient extubated 03/30/2018.  Clinically improved.  With sats of 97% on room air.  Follow.  3.  Right urethral stone status post JJ stent placement 03/29/2018/hydronephrosis Per urology.  Cultures with no growth to date.  Urine cultures with insignificant growth.  Narrow down IV antibiotics from IV Zosyn to IV Rocephin and treat for total of 7 to 10 days.  Foley catheter removed per urology.  4.  Acute renal failure Baseline creatinine less than 1.  Likely secondary to a prerenal azotemia as patient noted to be hypotensive requiring pressors in the setting of IV vancomycin and hydronephrosis.  IV vancomycin has been discontinued.  Urine output not measured.  He went up as high as 1.88.  Renal function slowly trending down.  Creatinine down to 1.61.  Change IV fluids to half-normal saline at 100 cc/h.  Monitor closely for volume overload.  Follow renal function.  5.  Hypokalemia Replete.  6.  Bradycardia/pauses/Elevated troponin Patient noted to be bradycardic with heart rates in the 30s overnight with multiple pauses noted.  Patient denies any chest pain or shortness of breath.  Patient not on any rate limiting medications.  Cycle cardiac enzymes every  6 hours x3.  TSH within normal limits.  Check a 2D echo.  Consult with cardiology for further evaluation and management.  7.  Diarrhea Improved.  Patient now with mushy soft stools.  Imodium as needed.  8.  Obstructive sleep apnea CPAP nightly.  9.  Acute metabolic encephalopathy Improved.  10.  Multiple system atrophy/ Depression Continue  Sinemet and Lexapro.  11.  Diabetes mellitus II Check a hemoglobin A1c.  CBGs 129 this morning.  Continue sliding scale insulin.    DVT prophylaxis: Lovenox Code Status: Full Family Communication: updated patient.  No family at bedside. Disposition Plan: Remain in stepdown unit.   Consultants:   PCCM: Dr. Jimmey Ralph 03/29/2018  Urology: Dr. Fredric Dine. Tresa Moore 03/28/2018  Procedures:   CT renal stone protocol 03/28/2018  Chest x-ray 03/30/2018, 03/29/2018, 03/28/2018  Abdominal films 03/28/2018  Cystoscopy with right retrograde pyelogram and interpretation of right ureteral stent placement 03/29/2018 per Dr. Tresa Moore  Right IJ CVL 03/29/2018  ETT 03/29/2018>>>>> 03/30/2018  Antimicrobials:   IV Zosyn 03/28/2018>>>>>> 04/01/2018  IV vancomycin 03/28/2018>>>>>> 03/30/2018  IV Rocephin 04/01/2018   Subjective: Since sitting up in bed.  Denies any chest pain.  States feels a little bit short of breath.  No abdominal pain.  No nausea vomiting.  Patient noted overnight to be bradycardic with heart rates in the 30s to 40s with multiple 1.5-second pauses.  Objective: Vitals:   04/01/18 0000 04/01/18 0308 04/01/18 0400 04/01/18 0800  BP: (!) 127/57  (!) 159/75 (!) 165/76  Pulse: (!) 55  (!) 58 (!) 55  Resp: (!) 28  (!) 28 (!) 31  Temp:  98.8 F (37.1 C)  99.2 F (37.3 C)  TempSrc:  Axillary  Axillary  SpO2: 99%  97% 97%  Weight:  103.3 kg (227 lb 11.8 oz)    Height:        Intake/Output Summary (Last 24 hours) at 04/01/2018 1032 Last data filed at 04/01/2018 0800 Gross per 24 hour  Intake 2389.5 ml  Output -  Net 2389.5 ml   Filed Weights   03/30/18 0500 03/31/18 0500 04/01/18 0308  Weight: 100.5 kg (221 lb 9 oz) 99.4 kg (219 lb 2.2 oz) 103.3 kg (227 lb 11.8 oz)    Examination:  General exam: Appears calm and comfortable  Respiratory system: Clear to auscultation. Respiratory effort normal. Cardiovascular system: Bradycardic.  No JVD, no murmurs, no rubs, no gallops.  No  lower extremity edema.  Gastrointestinal system: Abdomen is nondistended, soft and nontender. No organomegaly or masses felt. Normal bowel sounds heard. Central nervous system: Alert and oriented. No focal neurological deficits. Extremities: Symmetric 5 x 5 power. Skin: No rashes, lesions or ulcers Psychiatry: Judgement and insight appear normal. Mood & affect appropriate.     Data Reviewed: I have personally reviewed following labs and imaging studies  CBC: Recent Labs  Lab 03/28/18 2052 03/29/18 0500 03/30/18 0430 03/31/18 0417 04/01/18 0316  WBC 2.6* 23.6* 19.2* 21.8* 14.5*  NEUTROABS 2.2  --   --   --   --   HGB 12.7 12.5 11.1* 10.8* 10.6*  HCT 39.2 38.4 33.7* 32.8* 32.5*  MCV 89.5 90.1 88.0 88.6 89.0  PLT 142* 165 124* 129* 009*   Basic Metabolic Panel: Recent Labs  Lab 03/28/18 2030 03/28/18 2041  03/29/18 0500 03/29/18 1226 03/29/18 1620 03/30/18 0430 03/31/18 0417 04/01/18 0316  NA 138 138  --  139  --   --  136 143 146*  K 3.2* 3.1*  --  3.1*  --   --  3.5 3.6 3.2*  CL 102 103  --  107  --   --  99* 104 108  CO2 19*  --   --  19*  --   --  22 26 27   GLUCOSE 181* 181*  --  145*  --   --  262* 113* 145*  BUN 21* 22*  --  24*  --   --  37* 38* 39*  CREATININE 1.40* 1.30*  --  1.78*  --   --  1.88* 1.75* 1.61*  CALCIUM 9.2  --   --  8.0*  --   --  8.0* 8.0* 8.8*  MG  --   --    < > 1.2* 2.2 2.2 2.2 2.4  --   PHOS  --   --    < > 2.6 3.9 3.6 2.8 2.3*  --    < > = values in this interval not displayed.   GFR: Estimated Creatinine Clearance: 41 mL/min (A) (by C-G formula based on SCr of 1.61 mg/dL (H)). Liver Function Tests: Recent Labs  Lab 03/28/18 2030  AST 34  ALT 10*  ALKPHOS 111  BILITOT 1.3*  PROT 6.8  ALBUMIN 3.4*   No results for input(s): LIPASE, AMYLASE in the last 168 hours. No results for input(s): AMMONIA in the last 168 hours. Coagulation Profile: No results for input(s): INR, PROTIME in the last 168 hours. Cardiac Enzymes: Recent  Labs  Lab 03/29/18 0203 03/29/18 0243 03/29/18 0823 03/29/18 1539 03/29/18 2030  TROPONINI 0.65* 0.97* 0.92* 0.76* 0.83*   BNP (last 3 results) No results for input(s): PROBNP in the last 8760 hours. HbA1C: No results for input(s): HGBA1C in the last 72 hours. CBG: Recent Labs  Lab 03/31/18 0724 03/31/18 1202 03/31/18 1706 03/31/18 2109 04/01/18 0719  GLUCAP 124* 141* 123* 158* 129*   Lipid Profile: No results for input(s): CHOL, HDL, LDLCALC, TRIG, CHOLHDL, LDLDIRECT in the last 72 hours. Thyroid Function Tests: No results for input(s): TSH, T4TOTAL, FREET4, T3FREE, THYROIDAB in the last 72 hours. Anemia Panel: No results for input(s): VITAMINB12, FOLATE, FERRITIN, TIBC, IRON, RETICCTPCT in the last 72 hours. Sepsis Labs: Recent Labs  Lab 03/29/18 0203 03/29/18 0243 03/29/18 0428 03/29/18 0823 03/29/18 1226 03/30/18 0430 03/31/18 0417  PROCALCITON  --  >150.00  --   --   --  >150.00 109.41  LATICACIDVEN 3.7*  --  3.0* 2.9* 2.6*  --   --     Recent Results (from the past 240 hour(s))  Blood Culture (routine x 2)     Status: None (Preliminary result)   Collection Time: 03/28/18  8:10 PM  Result Value Ref Range Status   Specimen Description   Final    BLOOD LEFT FOREARM Performed at [facility], [place] [address], [place], [place] [address]    Special Requests   Final    BOTTLES DRAWN AEROBIC AND ANAEROBIC Blood Culture adequate volume Performed at [place] [address] [place], [place] [address]    Culture   Final    NO GROWTH 3 DAYS Performed at [facility], [place] [address], [place], [place] [address]    Report Status PENDING  Incomplete  Blood Culture (routine x 2)     Status: None (Preliminary result)   Collection Time: 03/28/18  8:20 PM  Result Value Ref Range Status   Specimen Description   Final    BLOOD RIGHT FOREARM Performed at [facility], [address] [place],  [place] [address]    Special Requests   Final    BOTTLES DRAWN AEROBIC AND ANAEROBIC Blood Culture adequate volume Performed at [place] [address] [place], [place] [address]    Culture   Final    NO GROWTH 3 DAYS Performed at [facility], [place] [address], [place], [place] [address]    Report Status PENDING  Incomplete  Urine culture     Status: None   Collection Time: 03/28/18  8:51 PM  Result Value Ref Range Status   Specimen Description   Final    URINE, CATHETERIZED Performed at [place] [address], [place], [place] [address]    Special Requests   Final    NONE Performed at [facility], [place] [address] [place], [place] [address]    Culture   Final    NO GROWTH Performed at [facility], [place] [address], [place], [place] [address]    Report Status 03/30/2018 FINAL  Final  Urine Culture     Status: Abnormal   Collection Time: 03/29/18 12:21 AM  Result Value Ref Range Status   Specimen Description   Final    URINE, RANDOM Performed at [place] [address] [place], [place] [address]    Special Requests   Final    NONE Performed at [facility], [place] [address] [place], [place] [address]    Culture (A)  Final    <10,000 COLONIES/mL INSIGNIFICANT GROWTH Performed at [place] [address], [place], [place] [address]    Report Status 03/30/2018 FINAL  Final  MRSA PCR Screening     Status: None   Collection Time: 03/29/18  1:57 AM  Result Value Ref Range Status   MRSA by PCR NEGATIVE NEGATIVE Final    Comment:        The GeneXpert MRSA Assay (FDA approved for NASAL specimens only), is one component of a comprehensive MRSA colonization surveillance program. It is not intended to diagnose MRSA infection nor to guide or monitor treatment for MRSA infections. Performed at [facility], [place] [address]  [place], [place] [address]   Urine Culture     Status: None   Collection Time: 03/29/18  2:55 AM  Result Value Ref Range Status   Specimen Description   Final    URINE, RANDOM Performed at [place] [address] [place], [place] [address]    Special Requests   Final    NONE Performed at [facility], [place] [address] [place], [place] [address]    Culture   Final    NO GROWTH Performed at [facility], [place] [address] [place],  [address]    Report Status 03/30/2018 FINAL  Final         Radiology Studies: No results found.      Scheduled Meds: . azelastine  1 spray Each Nare BID  . carbidopa-levodopa  2 tablet Oral TID  . enoxaparin (LOVENOX) injection  40 mg Subcutaneous Daily  . escitalopram  15 mg Oral QHS  . famotidine  20 mg Oral BID  . fluticasone  2 spray Each Nare Daily  . hydrocortisone sod succinate (SOLU-CORTEF) inj  25 mg Intravenous Q6H  . insulin aspart  0-15 Units Subcutaneous TID WC  . insulin aspart  0-5 Units Subcutaneous QHS  . mouth rinse  15  mL Mouth Rinse BID  . montelukast  10 mg Oral QHS   Continuous Infusions: . sodium chloride 100 mL/hr at 04/01/18 0934  . piperacillin-tazobactam (ZOSYN)  IV 3.375 g (04/01/18 0618)     LOS: 3 days    Time spent: 48 minutes    Irine Seal, MD [facility] Pager [phone]  If 7PM-7AM, please contact night-coverage [email or web address] Password [facility] 04/01/2018, 10:32 AM

## 2018-04-02 ENCOUNTER — Inpatient Hospital Stay (HOSPITAL_COMMUNITY): Payer: PPO

## 2018-04-02 DIAGNOSIS — I351 Nonrheumatic aortic (valve) insufficiency: Secondary | ICD-10-CM

## 2018-04-02 LAB — CBC WITH DIFFERENTIAL/PLATELET
BASOS ABS: 0 10*3/uL (ref 0.0–0.1)
BASOS PCT: 0 %
Eosinophils Absolute: 0.1 10*3/uL (ref 0.0–0.7)
Eosinophils Relative: 1 %
HEMATOCRIT: 34.8 % — AB (ref 36.0–46.0)
HEMOGLOBIN: 11.3 g/dL — AB (ref 12.0–15.0)
Lymphocytes Relative: 9 %
Lymphs Abs: 1.1 10*3/uL (ref 0.7–4.0)
MCH: 28.8 pg (ref 26.0–34.0)
MCHC: 32.5 g/dL (ref 30.0–36.0)
MCV: 88.8 fL (ref 78.0–100.0)
Monocytes Absolute: 0.9 10*3/uL (ref 0.1–1.0)
Monocytes Relative: 7 %
NEUTROS ABS: 9.9 10*3/uL — AB (ref 1.7–7.7)
NEUTROS PCT: 83 %
Platelets: 168 10*3/uL (ref 150–400)
RBC: 3.92 MIL/uL (ref 3.87–5.11)
RDW: 14.9 % (ref 11.5–15.5)
WBC: 11.9 10*3/uL — ABNORMAL HIGH (ref 4.0–10.5)

## 2018-04-02 LAB — COMPREHENSIVE METABOLIC PANEL
ALBUMIN: 2.5 g/dL — AB (ref 3.5–5.0)
ALK PHOS: 73 U/L (ref 38–126)
ALT: 5 U/L — ABNORMAL LOW (ref 14–54)
AST: 15 U/L (ref 15–41)
Anion gap: 13 (ref 5–15)
BILIRUBIN TOTAL: 1.1 mg/dL (ref 0.3–1.2)
BUN: 42 mg/dL — AB (ref 6–20)
CALCIUM: 8.7 mg/dL — AB (ref 8.9–10.3)
CO2: 27 mmol/L (ref 22–32)
Chloride: 105 mmol/L (ref 101–111)
Creatinine, Ser: 1.58 mg/dL — ABNORMAL HIGH (ref 0.44–1.00)
GFR calc Af Amer: 38 mL/min — ABNORMAL LOW (ref >60)
GFR, EST NON AFRICAN AMERICAN: 33 mL/min — AB (ref >60)
GLUCOSE: 175 mg/dL — AB (ref 65–99)
Potassium: 3 mmol/L — ABNORMAL LOW (ref 3.5–5.1)
Sodium: 145 mmol/L (ref 135–145)
TOTAL PROTEIN: 5.8 g/dL — AB (ref 6.5–8.1)

## 2018-04-02 LAB — HEMOGLOBIN A1C
Hgb A1c MFr Bld: 6.3 % — ABNORMAL HIGH (ref 4.8–5.6)
Mean Plasma Glucose: 134.11 mg/dL

## 2018-04-02 LAB — GLUCOSE, CAPILLARY
GLUCOSE-CAPILLARY: 140 mg/dL — AB (ref 65–99)
GLUCOSE-CAPILLARY: 198 mg/dL — AB (ref 65–99)
Glucose-Capillary: 202 mg/dL — ABNORMAL HIGH (ref 65–99)
Glucose-Capillary: 254 mg/dL — ABNORMAL HIGH (ref 65–99)
Glucose-Capillary: 163 mg/dL — ABNORMAL HIGH (ref 65–99)
Glucose-Capillary: 157 mg/dL — ABNORMAL HIGH (ref 65–99)

## 2018-04-02 LAB — CULTURE, BLOOD (ROUTINE X 2)
CULTURE: NO GROWTH
Culture: NO GROWTH
SPECIAL REQUESTS: ADEQUATE
Special Requests: ADEQUATE

## 2018-04-02 LAB — ECHOCARDIOGRAM COMPLETE
HEIGHTINCHES: 65 in
Weight: 3661.4 oz

## 2018-04-02 LAB — MAGNESIUM: MAGNESIUM: 2 mg/dL (ref 1.7–2.4)

## 2018-04-02 MED ORDER — POTASSIUM CHLORIDE CRYS ER 20 MEQ PO TBCR
40.0000 meq | EXTENDED_RELEASE_TABLET | Freq: Once | ORAL | Status: AC
  Administered 2018-04-02: 40 meq via ORAL
  Filled 2018-04-02: qty 2

## 2018-04-02 MED ORDER — HYDROCORTISONE 20 MG PO TABS
20.0000 mg | ORAL_TABLET | Freq: Once | ORAL | Status: AC
Start: 2018-04-02 — End: 2018-04-02
  Administered 2018-04-02: 20 mg via ORAL
  Filled 2018-04-02: qty 1

## 2018-04-02 NOTE — Evaluation (Signed)
Physical Therapy Evaluation Patient Details Name: [name] [name] MRN: [id number] DOB: 07-02-1951 Today's Date: 04/02/2018   History of Present Illness  67 year old female admitted with severe sepsis with septic shock; family found her unresponsive.  Has had uti.  PMH:  DM, progressive supranuclear palsy, polyneuropathy, multiple system atrophy, HTN  Clinical Impression  Pt admitted as above and presenting with functional mobility limitations 2* weakness, limited endurance, balance deficits and obesity.  Pt should benefit from follow up rehab at SNF level to maximize IND and safety.    Follow Up Recommendations SNF    Equipment Recommendations  None recommended by PT    Recommendations for Other Services       Precautions / Restrictions Precautions Precautions: Fall Restrictions Weight Bearing Restrictions: No      Mobility  Bed Mobility Overal bed mobility: Needs Assistance Bed Mobility: Rolling;Supine to Sit;Sit to Supine Rolling: Max assist;+2 for physical assistance   Supine to sit: Max assist;+2 for physical assistance Sit to supine: Max assist;+2 for physical assistance   General bed mobility comments: assist for trunk and legs. Pt able to use L hand on rail.   Transfers                 General transfer comment: not tested  Ambulation/Gait                Stairs            Wheelchair Mobility    Modified Rankin (Stroke Patients Only)       Balance Overall balance assessment: Needs assistance Sitting-balance support: Feet supported;Bilateral upper extremity supported Sitting balance-Leahy Scale: Poor Sitting balance - Comments: min A to balance once she initially gained sitting balance                                     Pertinent Vitals/Pain Pain Assessment: No/denies pain    Home Living Family/patient expects to be discharged to:: Unsure                 Additional Comments: per chart from home; unsure  of assistance    Prior Function Level of Independence: Needs assistance         Comments: pt has assistance with all adls; got up to w/c, unclear about transfer method     Hand Dominance   Dominant Hand: Left    Extremity/Trunk Assessment   Upper Extremity Assessment Upper Extremity Assessment: Defer to OT evaluation RUE Deficits / Details: pt able to move R hand and wrist and oppose to index finger. Non-functional during bed mobility; arm feels heavy    Lower Extremity Assessment Lower Extremity Assessment: Generalized weakness;RLE deficits/detail;LLE deficits/detail RLE Deficits / Details: R LE weaker vs L ~ 2/5  LLE Deficits / Details: ~3-/5       Communication   Communication: No difficulties  Cognition Arousal/Alertness: Awake/alert Behavior During Therapy: WFL for tasks assessed/performed Overall Cognitive Status: No family/caregiver present to determine baseline cognitive functioning                                 General Comments: all questions asked, pt responded "uh huh".  She did say that she has variable movement in hand 2* PSP      General Comments General comments (skin integrity, edema, etc.): Pt incontinent of bowel and bladder  Exercises     Assessment/Plan    PT Assessment Patient needs continued PT services  PT Problem List Decreased strength;Decreased activity tolerance;Decreased balance;Decreased mobility;Decreased knowledge of use of DME;Obesity       PT Treatment Interventions DME instruction;Gait training;Functional mobility training;Therapeutic activities;Therapeutic exercise;Patient/family education    PT Goals (Current goals can be found in the Care Plan section)  Acute Rehab PT Goals Patient Stated Goal: Get cleaned up PT Goal Formulation: With patient Time For Goal Achievement: 04/16/18 Potential to Achieve Goals: Fair    Frequency Min 2X/week   Barriers to discharge        Co-evaluation PT/OT/SLP  Co-Evaluation/Treatment: Yes Reason for Co-Treatment: Complexity of the patient's impairments (multi-system involvement) PT goals addressed during session: Mobility/safety with mobility OT goals addressed during session: ADL's and self-care       AM-PAC PT "6 Clicks" Daily Activity  Outcome Measure Difficulty turning over in bed (including adjusting bedclothes, sheets and blankets)?: Unable Difficulty moving from lying on back to sitting on the side of the bed? : Unable Difficulty sitting down on and standing up from a chair with arms (e.g., wheelchair, bedside commode, etc,.)?: Unable Help needed moving to and from a bed to chair (including a wheelchair)?: Total Help needed walking in hospital room?: Total Help needed climbing 3-5 steps with a railing? : Total 6 Click Score: 6    End of Session         PT Visit Diagnosis: Muscle weakness (generalized) (M62.81);Difficulty in walking, not elsewhere classified (R26.2)    Time: 4650-3546 PT Time Calculation (min) (ACUTE ONLY): 39 min   Charges:   PT Evaluation $PT Eval Moderate Complexity: 1 Mod PT Treatments $Therapeutic Activity: 8-22 mins   PT G Codes:        Pg [phone]   Adalynne Steffensmeier 04/02/2018, 2:08 PM

## 2018-04-02 NOTE — Evaluation (Signed)
Occupational Therapy Evaluation Patient Details Name: [name] [name] MRN: [id number] DOB: 1951-05-18 Today's Date: 04/02/2018    History of Present Illness 67 year old female admitted with severe sepsis with septic shock; family found her unresponsive.  Has had uti.  PMH:  DM, progressive supranuclear palsy, polyneuropathy, multiple system atrophy, HTN   Clinical Impression   Pt was admitted for the above. Pt reports that she has assistance for all ADLs but that she got up to w/c via SPT.  Unable to verify this, and pt has weakness throughout. Will follow in acute setting with the goals below to assist with mobility for adls.   Follow Up Recommendations  SNF    Equipment Recommendations  (to be further assessed)    Recommendations for Other Services       Precautions / Restrictions Precautions Precautions: Fall Restrictions Weight Bearing Restrictions: No      Mobility Bed Mobility Overal bed mobility: Needs Assistance Bed Mobility: Rolling;Supine to Sit;Sit to Supine Rolling: Max assist;+2 for physical assistance   Supine to sit: Max assist;+2 for physical assistance Sit to supine: Max assist;+2 for physical assistance   General bed mobility comments: assist for trunk and legs. Pt able to use L hand on rail.   Transfers                 General transfer comment: not tested    Balance Overall balance assessment: Needs assistance Sitting-balance support: Feet supported;Bilateral upper extremity supported(R UE placed in weight bearing position ) Sitting balance-Leahy Scale: Poor Sitting balance - Comments: min A to balance once she initially gained sitting balance                                   ADL either performed or assessed with clinical judgement   ADL Overall ADL's : Needs assistance/impaired                 Upper Body Dressing : Total assistance Upper Body Dressing Details (indicate cue type and reason): pt 10%                    General ADL Comments: pt has total assist for ADLs at baseline. Pt reports that she doesn't feed herself although she is L handed. May be due to endurance     Vision Patient Visual Report: Diplopia(closes one eye)       Perception     Praxis      Pertinent Vitals/Pain Pain Assessment: No/denies pain     Hand Dominance Left   Extremity/Trunk Assessment Upper Extremity Assessment Upper Extremity Assessment: RUE deficits/detail RUE Deficits / Details: pt able to move R hand and wrist and oppose to index finger. Non-functional during bed mobility; arm feels heavy.  LUE general weakness           Communication Communication Communication: No difficulties   Cognition Arousal/Alertness: Awake/alert Behavior During Therapy: WFL for tasks assessed/performed Overall Cognitive Status: No family/caregiver present to determine baseline cognitive functioning                                 General Comments: all questions asked, pt responded "uh huh".  She did say that she has variable movement in hand 2* PSP Pt reports she does SPT to w/c; unable to verify.  Generalized weakness at this time   General  Comments  pt incontinent x urine (despite purewick) and bowel.  Completed several rolls to place clean linen and perform hygiene.  RN brought sheets and linen also changed once RN arrived    Exercises     Shoulder Instructions      Home Living                                   Additional Comments: per chart from home; unsure of assistance      Prior Functioning/Environment Level of Independence: Needs assistance        Comments: pt has assistance with all adls; got up to w/c, unclear about transfer method        OT Problem List: Decreased strength;Decreased activity tolerance;Impaired balance (sitting and/or standing);Impaired UE functional use      OT Treatment/Interventions: Self-care/ADL training;DME and/or AE  instruction;Patient/family education;Balance training;Therapeutic activities    OT Goals(Current goals can be found in the care plan section) Acute Rehab OT Goals Patient Stated Goal: none stated OT Goal Formulation: With patient Time For Goal Achievement: 04/16/18 Potential to Achieve Goals: Good ADL Goals Additional ADL Goal #1: (P) pt will roll to R side with mod A +2 for adls Additional ADL Goal #2: (P) pt will sit eob x 5 minutes with min guard in preparation for toilet transfers  OT Frequency: Min 2X/week   Barriers to D/C:            Co-evaluation PT/OT/SLP Co-Evaluation/Treatment: Yes Reason for Co-Treatment: Complexity of the patient's impairments (multi-system involvement) PT goals addressed during session: Mobility/safety with mobility OT goals addressed during session: ADL's and self-care      AM-PAC PT "6 Clicks" Daily Activity     Outcome Measure Help from another person eating meals?: Total Help from another person taking care of personal grooming?: Total Help from another person toileting, which includes using toliet, bedpan, or urinal?: A Lot Help from another person bathing (including washing, rinsing, drying)?: Total Help from another person to put on and taking off regular upper body clothing?: Total Help from another person to put on and taking off regular lower body clothing?: Total 6 Click Score: 7   End of Session    Activity Tolerance: Patient tolerated treatment well Patient left: in bed;with call bell/phone within reach;with bed alarm set;with nursing/sitter in room  OT Visit Diagnosis: Muscle weakness (generalized) (M62.81)                Time: 1751-0258 OT Time Calculation (min): 39 min Charges:  OT General Charges $OT Visit: 1 Visit OT Evaluation $OT Eval Moderate Complexity: 1 Mod G-Codes:     [place], OTR/L [phone] 04/02/2018  [name] [name] 04/02/2018, 1:19 PM

## 2018-04-02 NOTE — Progress Notes (Signed)
PROGRESS NOTE    [name] [name]  MVE:[id number] DOB: Mar 07, 1951 DOA: 03/28/2018 PCP: [name], MD   Brief Narrative:  [phone] with hx DM, OSA, HTN, Depression, OA, Multiple system atrophy, and Nephrolithiasis, presented to the ER on 5/17 after she was found unresponsive by her family. Prior to being found unresponsive, she was seen by her PCP and diagnosed with a UTI. She was sent home with a prescription for ciprofloxacin. She took 1 dose of the cipro after which family came home and found her minimally responsive. EMS was called and found patient to be hypotensive and tachycardic. She was brought to the ER where she was found to have a temperature of 104 and was found to be in septic shock due to obstructive uropathy. CT Abdomen revealed pyelonephritis and a 57mm obstructive stone in the right ureter. She was taken to the OR where Urology placed a ureteral stent. She returned to the ICU and was requiring vasopressors, so PCCM was consulted. Patient noted to have worsening hypoxia and increased work of breathing and subsequently intubated.  Patient placed empirically on IV antibiotics and pancultured.  Patient subsequently extubated 03/30/2018.  Pressors subsequently discontinued 03/31/2018.  Patient transferred to [facility] 04/01/2018.  Assessment & Plan:   Principal Problem:   Severe sepsis with septic shock (HCC) Active Problems:   Diabetes type 2, uncontrolled (HCC)   OSA on CPAP   PSP (progressive supranuclear palsy) (HCC)   Hypokalemia   Elevated lactic acid level   Elevated troponin   Pyohydronephrosis   Septic shock (HCC)   Bradycardia   UTI (urinary tract infection)   ARF (acute renal failure) (HCC)   Right ureteral stone   Acute respiratory failure with hypoxia (HCC)   Pyelonephritis   Severe sepsis (HCC)   Acute diastolic heart failure ([place])  #1 severe sepsis with septic shock secondary to urosepsis/pyelonephritis/UTI Patient currently off pressors with  improvement with blood pressure.  Patient pancultured with blood cultures with no growth to date.  Urine cultures negative.  Patient status post JJ stent placement per urology 03/29/2018.  Patient was on IV vancomycin and IV Zosyn and then just on IV Zosyn.  Narrow IV antibiotics to IV Rocephin.  Start to taper stress dose IV steroids.  Follow.   2.  Status post vent dependent respiratory failure/acute respiratory failure with hypoxia Patient extubated 03/30/2018.  Clinically improved.  With sats of 97% on room air.  Follow.  3.  Right urethral stone status post JJ stent placement 03/29/2018/hydronephrosis Per urology.  Cultures with no growth to date.  Urine cultures with insignificant growth.  Narrow down IV antibiotics from IV Zosyn to IV Rocephin and treat for total of 7 to 10 days.  Foley catheter removed per urology.  4.  Acute renal failure Baseline creatinine less than 1.  Likely secondary to a prerenal azotemia as patient noted to be hypotensive requiring pressors in the setting of IV vancomycin and hydronephrosis.  IV vancomycin has been discontinued. Urine output not measured.  He went up as high as 1.88.  Renal function slowly trending down.  Creatinine down to 1.61.  Change IV fluids to half-normal saline at 100 cc/h.  Monitor closely for volume overload.  Follow renal function.  5.  Hypokalemia Replete.  6.  Bradycardia/pauses/Elevated troponin Patient noted to be bradycardic with heart rates in the 30s overnight with multiple pauses noted.  Patient denies any chest pain or shortness of breath.  Patient not on any rate limiting medications.  Cycle cardiac enzymes every 6 hours x3.  TSH within normal limits.  Check a 2D echo.  Consult with cardiology for further evaluation and management.  7.  Diarrhea Improved.  Patient now with mushy soft stools.  Imodium as needed.  8.  Obstructive sleep apnea CPAP nightly.  9.  Acute metabolic encephalopathy Improved.  10.  Multiple system  atrophy/ Depression Continue Sinemet and Lexapro.  11.  Diabetes mellitus II A1c 6.3.  Sliding scale insulin.    DVT prophylaxis: Lovenox Code Status: Full Family Communication: updated patient.  No family at bedside. Disposition Plan: Transfer to telemetry tomorrow if no significant bradycardic events overnight  Consultants:   PCCM: Dr. Jimmey Ralph 03/29/2018  Urology: Dr. Fredric Dine. Tresa Moore 03/28/2018  Cardiology Dr Burt Knack  Procedures:   CT renal stone protocol 03/28/2018  Chest x-ray 03/30/2018, 03/29/2018, 03/28/2018  Abdominal films 03/28/2018  Cystoscopy with right retrograde pyelogram and interpretation of right ureteral stent placement 03/29/2018 per Dr. Tresa Moore  Right IJ CVL 03/29/2018  ETT 03/29/2018>>>>> 03/30/2018  Antimicrobials:   IV Zosyn 03/28/2018>>>>>> 04/01/2018  IV vancomycin 03/28/2018>>>>>> 03/30/2018  IV Rocephin 04/01/2018   Subjective: No acute complaints, no acute events overnight.  Heart rate remains low but no significant pauses or sinus arrest.  Objective: Vitals:   04/02/18 0800 04/02/18 1000 04/02/18 1200 04/02/18 1600  BP: (!) 162/74 128/61  (!) 148/64  Pulse: (!) 57 (!) 55  (!) 52  Resp: (!) 21 (!) 22  20  Temp: 97.6 F (36.4 C)  97.8 F (36.6 C)   TempSrc: Oral  Oral   SpO2: 100% 99%  100%  Weight:      Height:        Intake/Output Summary (Last 24 hours) at 04/02/2018 1655 Last data filed at 04/02/2018 1401 Gross per 24 hour  Intake 900 ml  Output 2000 ml  Net -1100 ml   Filed Weights   03/31/18 0500 04/01/18 0308 04/02/18 0500  Weight: 99.4 kg (219 lb 2.2 oz) 103.3 kg (227 lb 11.8 oz) 103.8 kg (228 lb 13.4 oz)    Examination:  General exam: Appears calm and comfortable  Respiratory system: Clear to auscultation. Respiratory effort normal. Cardiovascular system: Bradycardic.  No JVD, no murmurs, no rubs, no gallops.  No lower extremity edema.  Gastrointestinal system: Abdomen is nondistended, soft and nontender. No organomegaly  or masses felt. Normal bowel sounds heard. Central nervous system: Alert and oriented. No focal neurological deficits. Extremities: Symmetric 5 x 5 power. Skin: No rashes, lesions or ulcers Psychiatry: Judgement and insight appear normal. Mood & affect appropriate.     Data Reviewed: I have personally reviewed following labs and imaging studies  CBC: Recent Labs  Lab 03/28/18 2052 03/29/18 0500 03/30/18 0430 03/31/18 0417 04/01/18 0316 04/02/18 0331  WBC 2.6* 23.6* 19.2* 21.8* 14.5* 11.9*  NEUTROABS 2.2  --   --   --   --  9.9*  HGB 12.7 12.5 11.1* 10.8* 10.6* 11.3*  HCT 39.2 38.4 33.7* 32.8* 32.5* 34.8*  MCV 89.5 90.1 88.0 88.6 89.0 88.8  PLT 142* 165 124* 129* 121* 194   Basic Metabolic Panel: Recent Labs  Lab 03/29/18 0500 03/29/18 1226 03/29/18 1620 03/30/18 0430 03/31/18 0417 04/01/18 0316 04/02/18 0331  NA 139  --   --  136 143 146* 145  K 3.1*  --   --  3.5 3.6 3.2* 3.0*  CL 107  --   --  99* 104 108 105  CO2 19*  --   --  22 26 27 27   GLUCOSE 145*  --   --  262* 113* 145* 175*  BUN 24*  --   --  37* 38* 39* 42*  CREATININE 1.78*  --   --  1.88* 1.75* 1.61* 1.58*  CALCIUM 8.0*  --   --  8.0* 8.0* 8.8* 8.7*  MG 1.2* 2.2 2.2 2.2 2.4  --  2.0  PHOS 2.6 3.9 3.6 2.8 2.3*  --   --    GFR: Estimated Creatinine Clearance: 41.9 mL/min (A) (by C-G formula based on SCr of 1.58 mg/dL (H)). Liver Function Tests: Recent Labs  Lab 03/28/18 2030 04/02/18 0331  AST 34 15  ALT 10* 5*  ALKPHOS 111 73  BILITOT 1.3* 1.1  PROT 6.8 5.8*  ALBUMIN 3.4* 2.5*   No results for input(s): LIPASE, AMYLASE in the last 168 hours. No results for input(s): AMMONIA in the last 168 hours. Coagulation Profile: No results for input(s): INR, PROTIME in the last 168 hours. Cardiac Enzymes: Recent Labs  Lab 03/29/18 1539 03/29/18 2030 04/01/18 0930 04/01/18 1518 04/01/18 2104  TROPONINI 0.76* 0.83* 0.18* 0.17* 0.15*   BNP (last 3 results) No results for input(s): PROBNP in  the last 8760 hours. HbA1C: Recent Labs    04/02/18 0331  HGBA1C 6.3*   CBG: Recent Labs  Lab 04/01/18 1205 04/01/18 1539 04/01/18 2118 04/02/18 0727 04/02/18 1230  GLUCAP 135* 158* 195* 163* 140*   Lipid Profile: No results for input(s): CHOL, HDL, LDLCALC, TRIG, CHOLHDL, LDLDIRECT in the last 72 hours. Thyroid Function Tests: No results for input(s): TSH, T4TOTAL, FREET4, T3FREE, THYROIDAB in the last 72 hours. Anemia Panel: No results for input(s): VITAMINB12, FOLATE, FERRITIN, TIBC, IRON, RETICCTPCT in the last 72 hours. Sepsis Labs: Recent Labs  Lab 03/29/18 0203 03/29/18 0243 03/29/18 0428 03/29/18 0823 03/29/18 1226 03/30/18 0430 03/31/18 0417  PROCALCITON  --  >150.00  --   --   --  >150.00 109.41  LATICACIDVEN 3.7*  --  3.0* 2.9* 2.6*  --   --     Recent Results (from the past 240 hour(s))  Blood Culture (routine x 2)     Status: None   Collection Time: 03/28/18  8:10 PM  Result Value Ref Range Status   Specimen Description   Final    BLOOD LEFT FOREARM Performed at [facility], [place] [address] [place], [place] [address]    Special Requests   Final    BOTTLES DRAWN AEROBIC AND ANAEROBIC Blood Culture adequate volume Performed at [facility] [address] [place], [place] [address]    Culture   Final    NO GROWTH 5 DAYS Performed at [facility], [name] [address] [place], [place] [address]    Report Status 04/02/2018 FINAL  Final  Blood Culture (routine x 2)     Status: None   Collection Time: 03/28/18  8:20 PM  Result Value Ref Range Status   Specimen Description   Final    BLOOD RIGHT FOREARM Performed at [place] [address], [place], [place] [address]    Special Requests   Final    BOTTLES DRAWN AEROBIC AND ANAEROBIC Blood Culture adequate volume Performed at [place] [facility] [address], [place], [place] [address]    Culture   Final    NO GROWTH  5 DAYS Performed at [facility], [place] [address] [place], [place] [address]    Report Status 04/02/2018  FINAL  Final  Urine culture     Status: None   Collection Time: 03/28/18  8:51 PM  Result Value Ref Range Status   Specimen Description   Final    URINE, CATHETERIZED Performed at [facility], [place] [address], [place], [place] [address]    Special Requests   Final    NONE Performed at [facility], [place] [address], [place], [place] [address]    Culture   Final    NO GROWTH Performed at [facility], [place] [address] [place], [place] [address]    Report Status 03/30/2018 FINAL  Final  Urine Culture     Status: Abnormal   Collection Time: 03/29/18 12:21 AM  Result Value Ref Range Status   Specimen Description   Final    URINE, RANDOM Performed at [place] [address], [place], [place] [address]    Special Requests   Final    NONE Performed at [facility], [place] [address] [place], [place] [address]    Culture (A)  Final    <10,000 COLONIES/mL INSIGNIFICANT GROWTH Performed at [place] [address] [place], [place] [address]    Report Status 03/30/2018 FINAL  Final  MRSA PCR Screening     Status: None   Collection Time: 03/29/18  1:57 AM  Result Value Ref Range Status   MRSA by PCR NEGATIVE NEGATIVE Final    Comment:        The GeneXpert MRSA Assay (FDA approved for NASAL specimens only), is one component of a comprehensive MRSA colonization surveillance program. It is not intended to diagnose MRSA infection nor to guide or monitor treatment for MRSA infections. Performed at [facility], [place] [address], [place], [place] [address]   Urine Culture     Status: None   Collection Time: 03/29/18  2:55 AM  Result Value Ref Range Status   Specimen Description   Final    URINE, RANDOM Performed at [place]  [address], [place], [place] [address]    Special Requests   Final    NONE Performed at [facility], [place] [address], [place], [place] [address]    Culture   Final    NO GROWTH Performed at [facility], [place] [address], [place], [place] [address]    Report Status 03/30/2018 FINAL  Final         Radiology Studies: No results found.      Scheduled Meds: . azelastine  1 spray Each Nare BID  . carbidopa-levodopa  2 tablet Oral TID  . enoxaparin (LOVENOX) injection  40 mg Subcutaneous Daily  . escitalopram  15 mg Oral QHS  . famotidine  20 mg Oral BID  . fluticasone  2 spray Each Nare Daily  . furosemide  40 mg Intravenous Q12H  . insulin aspart  0-15 Units Subcutaneous TID WC  . insulin aspart  0-5 Units Subcutaneous QHS  . mouth rinse  15 mL Mouth Rinse BID  . montelukast  10 mg Oral QHS   Continuous Infusions: . cefTRIAXone (ROCEPHIN)  IV Stopped (04/02/18 1430)     LOS: 4 days    Time spent: 40 minutes    Berle Mull, MD [facility]  If 7PM-7AM, please contact night-coverage [email or web address] Password [facility] 04/02/2018, 4:55 PM

## 2018-04-02 NOTE — Progress Notes (Addendum)
Progress Note  Patient Name: [name] [name] Date of Encounter: 04/02/2018  Primary Cardiologist: [facility]- Dr. Burt Knack  Subjective   Pt feeling good today, mild SOB. Working with PT/OT. Denies chest pain, palpitations  Inpatient Medications    Scheduled Meds: . azelastine  1 spray Each Nare BID  . carbidopa-levodopa  2 tablet Oral TID  . enoxaparin (LOVENOX) injection  40 mg Subcutaneous Daily  . escitalopram  15 mg Oral QHS  . famotidine  20 mg Oral BID  . fluticasone  2 spray Each Nare Daily  . furosemide  40 mg Intravenous Q12H  . insulin aspart  0-15 Units Subcutaneous TID WC  . insulin aspart  0-5 Units Subcutaneous QHS  . mouth rinse  15 mL Mouth Rinse BID  . montelukast  10 mg Oral QHS   Continuous Infusions: . cefTRIAXone (ROCEPHIN)  IV Stopped (04/01/18 1427)   PRN Meds: acetaminophen (TYLENOL) oral liquid 160 mg/5 mL **OR** acetaminophen, loperamide, [DISCONTINUED] ondansetron **OR** ondansetron (ZOFRAN) IV   Vital Signs    Vitals:   04/02/18 0320 04/02/18 0400 04/02/18 0500 04/02/18 0800  BP:  (!) 142/51    Pulse:  (!) 49    Resp:  16    Temp: 97.7 F (36.5 C)   97.6 F (36.4 C)  TempSrc: Oral   Oral  SpO2:  98%    Weight:   228 lb 13.4 oz (103.8 kg)   Height:        Intake/Output Summary (Last 24 hours) at 04/02/2018 1144 Last data filed at 04/02/2018 0600 Gross per 24 hour  Intake 460 ml  Output 2000 ml  Net -1540 ml   Filed Weights   03/31/18 0500 04/01/18 0308 04/02/18 0500  Weight: 219 lb 2.2 oz (99.4 kg) 227 lb 11.8 oz (103.3 kg) 228 lb 13.4 oz (103.8 kg)    Physical Exam   General: Elderly,  NAD Skin: Warm, dry, intact  Head: Normocephalic, atraumatic, clear, moist mucus membranes. Neck: Negative for carotid bruits. No JVD Lungs:Clear to ausculation bilaterally. No wheezes, rales, or rhonchi. Breathing is unlabored. Cardiovascular: RRR with S1 S2. No murmurs, rubs or gallops Abdomen: Soft, non-tender, non-distended with  normoactive bowel sounds. No obvious abdominal masses. MSK: Strength and tone appear normal for age. 5/5 in all extremities Extremities: No edema. No clubbing or cyanosis. DP/PT pulses 2+ bilaterally Neuro: Alert and oriented, slow to respond. No focal deficits. No facial asymmetry. MAE spontaneously. Psych: Responds slowly to questions appropriately with normal affect.    Labs    Chemistry Recent Labs  Lab 03/28/18 2030  03/31/18 0417 04/01/18 0316 04/02/18 0331  NA 138   < > 143 146* 145  K 3.2*   < > 3.6 3.2* 3.0*  CL 102   < > 104 108 105  CO2 19*   < > 26 27 27   GLUCOSE 181*   < > 113* 145* 175*  BUN 21*   < > 38* 39* 42*  CREATININE 1.40*   < > 1.75* 1.61* 1.58*  CALCIUM 9.2   < > 8.0* 8.8* 8.7*  PROT 6.8  --   --   --  5.8*  ALBUMIN 3.4*  --   --   --  2.5*  AST 34  --   --   --  15  ALT 10*  --   --   --  5*  ALKPHOS 111  --   --   --  73  BILITOT 1.3*  --   --   --  1.1  GFRNONAA 38*   < > 29* 32* 33*  GFRAA 44*   < > 34* 37* 38*  ANIONGAP 17*   < > 13.0 11 13   < > = values in this interval not displayed.     Hematology Recent Labs  Lab 03/31/18 0417 04/01/18 0316 04/02/18 0331  WBC 21.8* 14.5* 11.9*  RBC 3.70* 3.65* 3.92  HGB 10.8* 10.6* 11.3*  HCT 32.8* 32.5* 34.8*  MCV 88.6 89.0 88.8  MCH 29.2 29.0 28.8  MCHC 32.9 32.6 32.5  RDW 14.6 14.8 14.9  PLT 129* 121* 168    Cardiac Enzymes Recent Labs  Lab 03/29/18 2030 04/01/18 0930 04/01/18 1518 04/01/18 2104  TROPONINI 0.83* 0.18* 0.17* 0.15*    Recent Labs  Lab 03/28/18 2038  TROPIPOC 0.10*     BNPNo results for input(s): BNP, PROBNP in the last 168 hours.   DDimer No results for input(s): DDIMER in the last 168 hours.   Radiology    No results found.  Telemetry    04/02/18 SB HR 57 - Personally Reviewed  ECG     04/01/18 NB with HR 38- Personally Reviewed  Cardiac Studies   None   Patient Profile     67 y.o. female with a hx of HTN, HLD, DM 2, sleep apnea and  progressive supranuclear palsy (followed by neurology) who is being seen today for the evaluation of sinus bradycardia/intermittent pausing at the request of Dr. Grandville Silos.  Assessment & Plan    1.  Sinus bradycardia with intermittent pausing: -Cardiology consulted secondary to intermittent 2 to 2.5 second pauses on telemetry she began overnight.  Patient is currently asymptomatic with pausing. She remains in NSR, mainly with rates in the mid 50s. -EKG with SB HR 38 -She is on no AV blocking agents, per RN pharmacy has reviewed her medication list in which there are no interfering medications -No hemodynamic instability with episodic bradycardia   2.  Severe sepsis/septic shock secondary to pyohydronephrosis of right kidney: -Patient [facility] with altered mental status after receiving diagnosis of UTI.  Found to have right distal urethral stone with hydronephrosis.  She is now s/p stenting per urology. -Currently resolving, per pulmonary/critical care medicine. Now off vent and pressors (03/30/18).  White count remains elevated at 14.5 however, improved from yesterday.  Continues to have low-grade temp -Antibiotic therapy per primary team  3.  DM2: -SSI per primary team, last hemoglobin A1c 6.4  4.  OSA: -CPAP QHS  5.  AKI: -Improving>>creatinine, 1.58 today, down from 1.61 yesterday  -Peak creatinine this admission 1.88.  Recent baseline appears to be in the 0.5 range  6.  Progressive supranuclear palsy:  -Stable, followed by outpatient neurology. Patient with baseline verbal slurring  7. Elevated troponin: -Likely demand ischemia in the setting of severe urosepsis. No ACS symptoms  -Echocardiogram with pending results  -Trop, 0.18>0.17>0.15  8. Fluid volume overload secondary to sepsis fluid resuscitation/likely diastolic heart failure: -Weight, 228lb today, up from 227lb yesterday and 218lb on admission 03/28/18 -I&O, net positive 13.2L, total 24H  output 2L -IV Lasix 40 mg twice daily added yesterday for excessive fluid volume overload  -Creatinine, 1.58 today, down from 1.61 yesterday  -Echocardiogram with pending results, will guide further treatment   9. Hypokalemia: -K+3.0 this AM>>>will replace if primary team has not do so and follow closely  -Mg+ 2.0 -Continue to monitor tele closely   Signed, Kathyrn Drown NP-C [facility] Pager: [phone] 04/02/2018, 11:44 AM  Patient seen, examined. Available data reviewed. Agree with findings, assessment, and plan as outlined by Kathyrn Drown, NP. Pt in NAD, lungs CTA, JVP elevated, heart RRR no murmur, extremities with mild diffuse edema.   Appears to be diuresing well. Suspect weight will start trending down tomorrow. Tele review shows periods of marked sinus brady but no associated symptoms and no evidence of high grade heart block. Would continue IV diuresis until back to near baseline weight. Echo pending this afternoon. Will follow-up tomorrow. thx  Sherren Mocha, M.D. 04/02/2018 1:59 PM   For questions or updates, please contact   Please consult [email or web address] for contact info under Cardiology/STEMI.

## 2018-04-02 NOTE — Progress Notes (Signed)
  Echocardiogram 2D Echocardiogram has been performed.  [name] F 04/02/2018, 2:34 PM

## 2018-04-03 LAB — BASIC METABOLIC PANEL
ANION GAP: 15 (ref 5–15)
BUN: 40 mg/dL — ABNORMAL HIGH (ref 6–20)
CALCIUM: 8.8 mg/dL — AB (ref 8.9–10.3)
CO2: 31 mmol/L (ref 22–32)
Chloride: 102 mmol/L (ref 101–111)
Creatinine, Ser: 1.33 mg/dL — ABNORMAL HIGH (ref 0.44–1.00)
GFR, EST AFRICAN AMERICAN: 47 mL/min — AB (ref >60)
GFR, EST NON AFRICAN AMERICAN: 41 mL/min — AB (ref >60)
Glucose, Bld: 125 mg/dL — ABNORMAL HIGH (ref 65–99)
POTASSIUM: 2.8 mmol/L — AB (ref 3.5–5.1)
Sodium: 148 mmol/L — ABNORMAL HIGH (ref 135–145)

## 2018-04-03 LAB — GLUCOSE, CAPILLARY
GLUCOSE-CAPILLARY: 134 mg/dL — AB (ref 65–99)
GLUCOSE-CAPILLARY: 124 mg/dL — AB (ref 65–99)
Glucose-Capillary: 164 mg/dL — ABNORMAL HIGH (ref 65–99)
Glucose-Capillary: 116 mg/dL — ABNORMAL HIGH (ref 65–99)

## 2018-04-03 LAB — CBC WITH DIFFERENTIAL/PLATELET
BASOS ABS: 0 10*3/uL (ref 0.0–0.1)
BASOS PCT: 0 %
EOS PCT: 2 %
Eosinophils Absolute: 0.3 10*3/uL (ref 0.0–0.7)
HCT: 37.2 % (ref 36.0–46.0)
Hemoglobin: 12 g/dL (ref 12.0–15.0)
Lymphocytes Relative: 11 %
Lymphs Abs: 1.2 10*3/uL (ref 0.7–4.0)
MCH: 29.1 pg (ref 26.0–34.0)
MCHC: 32.3 g/dL (ref 30.0–36.0)
MCV: 90.1 fL (ref 78.0–100.0)
MONO ABS: 1 10*3/uL (ref 0.1–1.0)
Monocytes Relative: 9 %
NEUTROS ABS: 8.7 10*3/uL — AB (ref 1.7–7.7)
Neutrophils Relative %: 78 %
PLATELETS: 231 10*3/uL (ref 150–400)
RBC: 4.13 MIL/uL (ref 3.87–5.11)
RDW: 14.9 % (ref 11.5–15.5)
WBC: 11.2 10*3/uL — AB (ref 4.0–10.5)

## 2018-04-03 LAB — MAGNESIUM: Magnesium: 1.7 mg/dL (ref 1.7–2.4)

## 2018-04-03 MED ORDER — POTASSIUM CHLORIDE CRYS ER 20 MEQ PO TBCR
40.0000 meq | EXTENDED_RELEASE_TABLET | Freq: Once | ORAL | Status: AC
  Administered 2018-04-03: 40 meq via ORAL
  Filled 2018-04-03: qty 2

## 2018-04-03 MED ORDER — CEFDINIR 300 MG PO CAPS
300.0000 mg | ORAL_CAPSULE | Freq: Two times a day (BID) | ORAL | Status: DC
  Administered 2018-04-03 – 2018-04-05 (×5): 300 mg via ORAL
  Filled 2018-04-03 (×5): qty 1

## 2018-04-03 MED ORDER — FUROSEMIDE 40 MG PO TABS
40.0000 mg | ORAL_TABLET | Freq: Every day | ORAL | Status: DC
  Administered 2018-04-04: 40 mg via ORAL
  Filled 2018-04-03: qty 1

## 2018-04-03 NOTE — Consult Note (Signed)
   [place] CM Inpatient Consult   04/03/2018  [name] [name] 02/03/1951 [id number]    Patient screened for [place] [facility] services due to showing up as "active" with [facility]. However, [name] [name] has not been active with [facility] since February 2019. Notification sent to [facility] LCSW to address active status.   Went to bedside to speak with [name] [name]. Patient's sister in law asked that Probation officer contact patient's son [name] [name] at [phone] regarding [place] [facility] services. Provided brochure and contact information to sister in law.   Will follow up with son at later time to discuss if [name] services are needed.   Noted SNF is recommended.    [name] Rolling, MSN-Ed, RN,BSN [facility] Liaison [phone]

## 2018-04-03 NOTE — Progress Notes (Signed)
Progress Note  Patient Name: [name] [name] Date of Encounter: 04/03/2018  Primary Cardiologist: No primary care provider on file.   Subjective   The patient is feeling okay this morning.  She denies chest pain or shortness of breath.  Inpatient Medications    Scheduled Meds: . azelastine  1 spray Each Nare BID  . carbidopa-levodopa  2 tablet Oral TID  . cefdinir  300 mg Oral Q12H  . enoxaparin (LOVENOX) injection  40 mg Subcutaneous Daily  . escitalopram  15 mg Oral QHS  . famotidine  20 mg Oral BID  . fluticasone  2 spray Each Nare Daily  . furosemide  40 mg Intravenous Q12H  . insulin aspart  0-15 Units Subcutaneous TID WC  . insulin aspart  0-5 Units Subcutaneous QHS  . mouth rinse  15 mL Mouth Rinse BID  . montelukast  10 mg Oral QHS   Continuous Infusions:  PRN Meds: acetaminophen (TYLENOL) oral liquid 160 mg/5 mL **OR** acetaminophen, loperamide, [DISCONTINUED] ondansetron **OR** ondansetron (ZOFRAN) IV   Vital Signs    Vitals:   04/03/18 0400 04/03/18 0500 04/03/18 0600 04/03/18 0800  BP: (!) 143/55  (!) 145/55 (!) 145/57  Pulse: (!) 49 (!) 51 (!) 50 (!) 56  Resp: (!) 22 (!) 25 (!) 22 (!) 26  Temp:    97.8 F (36.6 C)  TempSrc:    Oral  SpO2: 96% 95% 97% 96%  Weight:  220 lb 0.3 oz (99.8 kg)    Height:        Intake/Output Summary (Last 24 hours) at 04/03/2018 1108 Last data filed at 04/03/2018 0302 Gross per 24 hour  Intake 560 ml  Output 1100 ml  Net -540 ml   Filed Weights   04/01/18 0308 04/02/18 0500 04/03/18 0500  Weight: 227 lb 11.8 oz (103.3 kg) 228 lb 13.4 oz (103.8 kg) 220 lb 0.3 oz (99.8 kg)    Telemetry    Sinus rhythm with periods of marked sinus bradycardia- Personally Reviewed   Physical Exam  Elderly woman, overweight, no distress GEN: No acute distress.   Neck: No JVD Cardiac: RRR, no murmurs, rubs, or gallops.  Respiratory: Clear to auscultation bilaterally. GI: Soft, nontender, non-distended  MS: No edema; No  deformity. Psych: Normal affect   Labs    Chemistry Recent Labs  Lab 03/28/18 2030  04/01/18 0316 04/02/18 0331 04/03/18 0753  NA 138   < > 146* 145 148*  K 3.2*   < > 3.2* 3.0* 2.8*  CL 102   < > 108 105 102  CO2 19*   < > 27 27 31   GLUCOSE 181*   < > 145* 175* 125*  BUN 21*   < > 39* 42* 40*  CREATININE 1.40*   < > 1.61* 1.58* 1.33*  CALCIUM 9.2   < > 8.8* 8.7* 8.8*  PROT 6.8  --   --  5.8*  --   ALBUMIN 3.4*  --   --  2.5*  --   AST 34  --   --  15  --   ALT 10*  --   --  5*  --   ALKPHOS 111  --   --  73  --   BILITOT 1.3*  --   --  1.1  --   GFRNONAA 38*   < > 32* 33* 41*  GFRAA 44*   < > 37* 38* 47*  ANIONGAP 17*   < > 11 13 15    < > =  values in this interval not displayed.     Hematology Recent Labs  Lab 04/01/18 0316 04/02/18 0331 04/03/18 0753  WBC 14.5* 11.9* 11.2*  RBC 3.65* 3.92 4.13  HGB 10.6* 11.3* 12.0  HCT 32.5* 34.8* 37.2  MCV 89.0 88.8 90.1  MCH 29.0 28.8 29.1  MCHC 32.6 32.5 32.3  RDW 14.8 14.9 14.9  PLT 121* 168 231    Cardiac Enzymes Recent Labs  Lab 03/29/18 2030 04/01/18 0930 04/01/18 1518 04/01/18 2104  TROPONINI 0.83* 0.18* 0.17* 0.15*    Recent Labs  Lab 03/28/18 2038  TROPIPOC 0.10*     BNPNo results for input(s): BNP, PROBNP in the last 168 hours.   DDimer No results for input(s): DDIMER in the last 168 hours.   Radiology    No results found.  Cardiac Studies   2D Echo: Study Conclusions  - Left ventricle: The cavity size was normal. There was mild   concentric hypertrophy. Systolic function was normal. The   estimated ejection fraction was in the range of 55% to 60%. Wall   motion was normal; there were no regional wall motion   abnormalities. Features are consistent with a pseudonormal left   ventricular filling pattern, with concomitant abnormal relaxation   and increased filling pressure (grade 2 diastolic dysfunction).   Doppler parameters are consistent with elevated ventricular   end-diastolic  filling pressure. - Aortic valve: There was mild regurgitation. - Mitral valve: There was mild regurgitation. - Left atrium: The atrium was severely dilated. - Right ventricle: The cavity size was moderately dilated. Wall   thickness was normal. Systolic function was mildly reduced. - Right atrium: The atrium was moderately dilated. - Tricuspid valve: There was moderate regurgitation. - Pulmonary arteries: Systolic pressure was mildly to moderately   increased. PA peak pressure: 40 mm Hg (S). - Pericardium, extracardiac: There was a left pleural effusion.   Patient Profile     67 y.o. 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 Grandville Silos.  Assessment & Plan    1.  Sinus bradycardia with intermittent sinus pauses.  Telemetry again reviewed and no pathologic pauses greater than 3 seconds.  The patient has no associated symptoms.  We will continue to avoid any AV nodal blocking agents.  No indication for any intervention at this time.  2.  Acute diastolic heart failure secondary to volume resuscitation in the setting of sepsis.  The patient has responded very well to IV diuresis.  Her weight is back down to baseline.  Will discontinue furosemide.  She is hypokalemic and potassium repletion has been ordered.  Her echocardiogram is reviewed and shows normal LV systolic function with grade 2 diastolic dysfunction.  3.  Elevated troponin: Suspect demand ischemia in the setting of severe urosepsis.  No ACS symptoms.  Normal LV function on echo.  Mild elevation of troponin with flat trend.  No further work-up recommended.  Overall the patient is stable.  I have not written for a standing dose of an oral loop diuretic but this could be considered down the road if she develops recurrent volume overload.  She is stable from a cardiac  perspective.  I will sign off today but please call back if any other issues arise.  For questions or updates, please contact [place] Please consult [email or web address] for contact info under Cardiology/STEMI.  Signed, Sherren Mocha, MD  04/03/2018, 11:08 AM

## 2018-04-03 NOTE — Progress Notes (Signed)
PROGRESS NOTE    [name] [name]  WVP:[id number] DOB: Mar 02, 1951 DOA: 03/28/2018 PCP: [name], MD   Brief Narrative:  [phone] with hx DM, OSA, HTN, Depression, OA, Multiple system atrophy, and Nephrolithiasis, presented to the ER on 5/17 after she was found unresponsive by her family. Prior to being found unresponsive, she was seen by her PCP and diagnosed with a UTI. She was sent home with a prescription for ciprofloxacin. She took 1 dose of the cipro after which family came home and found her minimally responsive. EMS was called and found patient to be hypotensive and tachycardic. She was brought to the ER where she was found to have a temperature of 104 and was found to be in septic shock due to obstructive uropathy. CT Abdomen revealed pyelonephritis and a 16mm obstructive stone in the right ureter. She was taken to the OR where Urology placed a ureteral stent. She returned to the ICU and was requiring vasopressors, so PCCM was consulted. Patient noted to have worsening hypoxia and increased work of breathing and subsequently intubated.  Patient placed empirically on IV antibiotics and pancultured.  Patient subsequently extubated 03/30/2018.  Pressors subsequently discontinued 03/31/2018.  Patient transferred to [facility] 04/01/2018.  Assessment & Plan:   Principal Problem:   Severe sepsis with septic shock (HCC) Active Problems:   Diabetes type 2, uncontrolled (HCC)   OSA on CPAP   PSP (progressive supranuclear palsy) (HCC)   Hypokalemia   Elevated lactic acid level   Elevated troponin   Pyohydronephrosis   Septic shock (HCC)   Bradycardia   UTI (urinary tract infection)   ARF (acute renal failure) (HCC)   Right ureteral stone   Acute respiratory failure with hypoxia (HCC)   Pyelonephritis   Severe sepsis (HCC)   Acute diastolic heart failure (HCC)  1 severe sepsis with septic shock secondary to urosepsis/pyelonephritis/UTI Patient currently off pressors with  improvement with blood pressure. Patient pancultured with blood cultures with no growth to date. Urine cultures negative. Patient status post JJ stent placement per urology 03/29/2018. Patient was on IV vancomycin and IV Zosyn and then just on IV Zosyn. Narrow IV antibiotics to IV Rocephin.  Will change to oral Omnicef and monitor.  Last urine antibiotic on 04/04/2018. Patient was given steroids for stress dose, now completed.  2.  Status post vent dependent respiratory failure/acute respiratory failure with hypoxia Patient extubated 03/30/2018.  Clinically improved.  With sats of 97% on room air.  Follow.  3.  Right urethral stone status post JJ stent placement 03/29/2018/hydronephrosis Per urology.  Cultures with no growth to date.  Urine cultures with insignificant growth.  Narrow down IV antibiotics from IV Zosyn to IV Rocephin and treat for total of 7 to 10 days.  Foley catheter removed per urology.  4.  Acute renal failure Baseline creatinine less than 1, 2 years ago.  Likely secondary to a prerenal azotemia as patient noted to be hypotensive requiring pressors in the setting of IV vancomycin and hydronephrosis. Creatinine almost back to preadmission creatinine.  Will monitor.  5.  Hypokalemia Replete.  6.  Bradycardia/pauses/Elevated troponin Patient noted to be bradycardic with heart rates in the 30s overnight with multiple pauses noted.  Patient denies any chest pain or shortness of breath.  Patient not on any rate limiting medications. Cardiology consulted, appreciate input.  Avoid AV nodal blocking agent.  No indication for any further work-up or treatment at present  7.  Diarrhea Improved.  Patient now with mushy  soft stools.  Imodium as needed.  8.  Obstructive sleep apnea CPAP nightly.  9.  Acute metabolic encephalopathy Improved.  10.  Multiple system atrophy/ Depression Continue Sinemet and Lexapro.  11.  Diabetes mellitus II A1c 6.3.  Sliding scale  insulin.    DVT prophylaxis: Lovenox Code Status: Full Family Communication: updated patient.  No family at bedside. Disposition Plan: Transfer to telemetry, likely discharge to SNF tomorrow day after.  Consultants:   PCCM: Dr. Jimmey Ralph 03/29/2018  Urology: Dr. Fredric Dine. Tresa Moore 03/28/2018  Cardiology Dr Burt Knack  Procedures:   CT renal stone protocol 03/28/2018  Chest x-ray 03/30/2018, 03/29/2018, 03/28/2018  Abdominal films 03/28/2018  Cystoscopy with right retrograde pyelogram and interpretation of right ureteral stent placement 03/29/2018 per Dr. Tresa Moore  Right IJ CVL 03/29/2018  ETT 03/29/2018>>>>> 03/30/2018  Antimicrobials:   IV Zosyn 03/28/2018>>>>>> 04/01/2018  IV vancomycin 03/28/2018>>>>>> 03/30/2018  IV Rocephin 04/01/2018   Subjective: Feeling better, no acute complaint, no chest pain or shortness of breath no nausea no vomiting.  Objective: Vitals:   04/03/18 0500 04/03/18 0600 04/03/18 0800 04/03/18 1200  BP:  (!) 145/55 (!) 145/57   Pulse: (!) 51 (!) 50 (!) 56   Resp: (!) 25 (!) 22 (!) 26   Temp:   97.8 F (36.6 C) 98.4 F (36.9 C)  TempSrc:   Oral Oral  SpO2: 95% 97% 96%   Weight: 99.8 kg (220 lb 0.3 oz)     Height:        Intake/Output Summary (Last 24 hours) at 04/03/2018 1355 Last data filed at 04/03/2018 0302 Gross per 24 hour  Intake 560 ml  Output 1100 ml  Net -540 ml   Filed Weights   04/01/18 0308 04/02/18 0500 04/03/18 0500  Weight: 103.3 kg (227 lb 11.8 oz) 103.8 kg (228 lb 13.4 oz) 99.8 kg (220 lb 0.3 oz)    Examination:  General exam: Appears calm and comfortable  Respiratory system: Clear to auscultation. Respiratory effort normal. Cardiovascular system: Bradycardic.  No JVD, no murmurs, no rubs, no gallops.  No lower extremity edema.  Gastrointestinal system: Abdomen is nondistended, soft and nontender. No organomegaly or masses felt. Normal bowel sounds heard. Central nervous system: Alert and oriented. No focal neurological  deficits. Extremities: Symmetric 5 x 5 power. Skin: No rashes, lesions or ulcers Psychiatry: Judgement and insight appear normal. Mood & affect appropriate.     Data Reviewed: I have personally reviewed following labs and imaging studies  CBC: Recent Labs  Lab 03/28/18 2052  03/30/18 0430 03/31/18 0417 04/01/18 0316 04/02/18 0331 04/03/18 0753  WBC 2.6*   < > 19.2* 21.8* 14.5* 11.9* 11.2*  NEUTROABS 2.2  --   --   --   --  9.9* 8.7*  HGB 12.7   < > 11.1* 10.8* 10.6* 11.3* 12.0  HCT 39.2   < > 33.7* 32.8* 32.5* 34.8* 37.2  MCV 89.5   < > 88.0 88.6 89.0 88.8 90.1  PLT 142*   < > 124* 129* 121* 168 231   < > = values in this interval not displayed.   Basic Metabolic Panel: Recent Labs  Lab 03/29/18 0500 03/29/18 1226 03/29/18 1620 03/30/18 0430 03/31/18 0417 04/01/18 0316 04/02/18 0331 04/03/18 0753  NA 139  --   --  136 143 146* 145 148*  K 3.1*  --   --  3.5 3.6 3.2* 3.0* 2.8*  CL 107  --   --  99* 104 108 105 102  CO2 19*  --   --  22 26 27 27 31   GLUCOSE 145*  --   --  262* 113* 145* 175* 125*  BUN 24*  --   --  37* 38* 39* 42* 40*  CREATININE 1.78*  --   --  1.88* 1.75* 1.61* 1.58* 1.33*  CALCIUM 8.0*  --   --  8.0* 8.0* 8.8* 8.7* 8.8*  MG 1.2* 2.2 2.2 2.2 2.4  --  2.0 1.7  PHOS 2.6 3.9 3.6 2.8 2.3*  --   --   --    GFR: Estimated Creatinine Clearance: 48.7 mL/min (A) (by C-G formula based on SCr of 1.33 mg/dL (H)). Liver Function Tests: Recent Labs  Lab 03/28/18 2030 04/02/18 0331  AST 34 15  ALT 10* 5*  ALKPHOS 111 73  BILITOT 1.3* 1.1  PROT 6.8 5.8*  ALBUMIN 3.4* 2.5*   No results for input(s): LIPASE, AMYLASE in the last 168 hours. No results for input(s): AMMONIA in the last 168 hours. Coagulation Profile: No results for input(s): INR, PROTIME in the last 168 hours. Cardiac Enzymes: Recent Labs  Lab 03/29/18 1539 03/29/18 2030 04/01/18 0930 04/01/18 1518 04/01/18 2104  TROPONINI 0.76* 0.83* 0.18* 0.17* 0.15*   BNP (last 3 results) No  results for input(s): PROBNP in the last 8760 hours. HbA1C: Recent Labs    04/02/18 0331  HGBA1C 6.3*   CBG: Recent Labs  Lab 04/02/18 1230 04/02/18 1729 04/02/18 2114 04/03/18 0751 04/03/18 1142  GLUCAP 140* 198* 157* 134* 164*   Lipid Profile: No results for input(s): CHOL, HDL, LDLCALC, TRIG, CHOLHDL, LDLDIRECT in the last 72 hours. Thyroid Function Tests: No results for input(s): TSH, T4TOTAL, FREET4, T3FREE, THYROIDAB in the last 72 hours. Anemia Panel: No results for input(s): VITAMINB12, FOLATE, FERRITIN, TIBC, IRON, RETICCTPCT in the last 72 hours. Sepsis Labs: Recent Labs  Lab 03/29/18 0203 03/29/18 0243 03/29/18 0428 03/29/18 0823 03/29/18 1226 03/30/18 0430 03/31/18 0417  PROCALCITON  --  >150.00  --   --   --  >150.00 109.41  LATICACIDVEN 3.7*  --  3.0* 2.9* 2.6*  --   --     Recent Results (from the past 240 hour(s))  Blood Culture (routine x 2)     Status: None   Collection Time: 03/28/18  8:10 PM  Result Value Ref Range Status   Specimen Description   Final    BLOOD LEFT FOREARM Performed at [facility], [place] [address], [place], [place] [address]    Special Requests   Final    BOTTLES DRAWN AEROBIC AND ANAEROBIC Blood Culture adequate volume Performed at [place] [address], [place], [place] [address]    Culture   Final    NO GROWTH 5 DAYS Performed at [facility], [place] [address], [place], [place] [address]    Report Status 04/02/2018 FINAL  Final  Blood Culture (routine x 2)     Status: None   Collection Time: 03/28/18  8:20 PM  Result Value Ref Range Status   Specimen Description   Final    BLOOD RIGHT FOREARM Performed at [place] [address] [place], [place] [address]    Special Requests   Final    BOTTLES DRAWN AEROBIC AND ANAEROBIC Blood Culture adequate volume Performed at [place] [address], [place],  [address]     Culture   Final    NO GROWTH 5 DAYS Performed at [facility], [place] [address],  [place], [place] [address]    Report Status 04/02/2018 FINAL  Final  Urine culture     Status: None   Collection Time: 03/28/18  8:51 PM  Result Value Ref Range Status   Specimen Description   Final    URINE, CATHETERIZED Performed at [facility], [place] [address], [place], [place] [address]    Special Requests   Final    NONE Performed at [facility], [place] [address] [place], [place] [address]    Culture   Final    NO GROWTH Performed at [facility], [place] [address], [place], [place] [address]    Report Status 03/30/2018 FINAL  Final  Urine Culture     Status: Abnormal   Collection Time: 03/29/18 12:21 AM  Result Value Ref Range Status   Specimen Description   Final    URINE, RANDOM Performed at [place] [address], [place], [place] [address]    Special Requests   Final    NONE Performed at [facility], [place] [address] [place], [place] [address]    Culture (A)  Final    <10,000 COLONIES/mL INSIGNIFICANT GROWTH Performed at [place] [address], [place], [place] [address]    Report Status 03/30/2018 FINAL  Final  MRSA PCR Screening     Status: None   Collection Time: 03/29/18  1:57 AM  Result Value Ref Range Status   MRSA by PCR NEGATIVE NEGATIVE Final    Comment:        The GeneXpert MRSA Assay (FDA approved for NASAL specimens only), is one component of a comprehensive MRSA colonization surveillance program. It is not intended to diagnose MRSA infection nor to guide or monitor treatment for MRSA infections. Performed at [facility], [place] [address] [place], [place] [address]   Urine Culture     Status: None   Collection Time: 03/29/18  2:55 AM  Result Value Ref Range Status   Specimen Description   Final    URINE, RANDOM Performed at [place] [address], [place], [place] [address]    Special Requests   Final    NONE Performed at [facility], [place] [address] [place], [place] [address]    Culture   Final    NO GROWTH Performed at [facility], [place] [address], [place], [place] [address]    Report Status 03/30/2018 FINAL  Final         Radiology Studies: No results found.      Scheduled Meds: . azelastine  1 spray Each Nare BID  . carbidopa-levodopa  2 tablet Oral TID  . cefdinir  300 mg Oral Q12H  . enoxaparin (LOVENOX) injection  40 mg Subcutaneous Daily  . escitalopram  15 mg Oral QHS  . famotidine  20 mg Oral BID  . fluticasone  2 spray Each Nare Daily  . [START ON 04/04/2018] furosemide  40 mg Oral Daily  . insulin aspart  0-15 Units Subcutaneous TID WC  . insulin aspart  0-5 Units Subcutaneous QHS  . mouth rinse  15 mL Mouth Rinse BID  . montelukast  10 mg Oral QHS  . potassium chloride  40 mEq Oral Once   Continuous Infusions:    LOS: 5 days    Time spent: 30minutes    Berle Mull, MD [facility]  If 7PM-7AM, please contact night-coverage [email or web address] Password [email or web address] 04/03/2018, 1:55  PM  

## 2018-04-04 ENCOUNTER — Encounter (HOSPITAL_COMMUNITY): Payer: Self-pay | Admitting: Urology

## 2018-04-04 LAB — MAGNESIUM: Magnesium: 1.7 mg/dL (ref 1.7–2.4)

## 2018-04-04 LAB — BASIC METABOLIC PANEL
ANION GAP: 13 (ref 5–15)
BUN: 32 mg/dL — ABNORMAL HIGH (ref 6–20)
CALCIUM: 8.6 mg/dL — AB (ref 8.9–10.3)
CO2: 30 mmol/L (ref 22–32)
Chloride: 104 mmol/L (ref 101–111)
Creatinine, Ser: 1.24 mg/dL — ABNORMAL HIGH (ref 0.44–1.00)
GFR, EST AFRICAN AMERICAN: 51 mL/min — AB (ref >60)
GFR, EST NON AFRICAN AMERICAN: 44 mL/min — AB (ref >60)
Glucose, Bld: 120 mg/dL — ABNORMAL HIGH (ref 65–99)
POTASSIUM: 3.4 mmol/L — AB (ref 3.5–5.1)
Sodium: 147 mmol/L — ABNORMAL HIGH (ref 135–145)

## 2018-04-04 LAB — GLUCOSE, CAPILLARY
GLUCOSE-CAPILLARY: 109 mg/dL — AB (ref 65–99)
GLUCOSE-CAPILLARY: 139 mg/dL — AB (ref 65–99)
Glucose-Capillary: 99 mg/dL (ref 65–99)
Glucose-Capillary: 125 mg/dL — ABNORMAL HIGH (ref 65–99)

## 2018-04-04 MED ORDER — FUROSEMIDE 10 MG/ML IJ SOLN
40.0000 mg | Freq: Two times a day (BID) | INTRAMUSCULAR | Status: DC
  Administered 2018-04-04 – 2018-04-05 (×2): 40 mg via INTRAVENOUS
  Filled 2018-04-04 (×2): qty 4

## 2018-04-04 MED ORDER — POTASSIUM CHLORIDE CRYS ER 20 MEQ PO TBCR
20.0000 meq | EXTENDED_RELEASE_TABLET | Freq: Two times a day (BID) | ORAL | Status: DC
  Administered 2018-04-04 – 2018-04-08 (×9): 20 meq via ORAL
  Filled 2018-04-04 (×9): qty 1

## 2018-04-04 NOTE — NC FL2 (Signed)
[place] LEVEL OF CARE SCREENING TOOL     IDENTIFICATION  Patient Name: [name] [name] Birthdate: 02/20/1951 Sex: female Admission Date (Current Location): 03/28/2018  [facility] and [place] Number:  Herbalist and Address:  [facility],  [place], [place]      Provider Number: [id number]  Attending Physician Name and Address:  Lavina Hamman, MD  Relative Name and Phone Number:       Current Level of Care: Hospital Recommended Level of Care: [place] Prior Approval Number:    Date Approved/Denied:   PASRR Number: pending  Discharge Plan: SNF    Current Diagnoses: Patient Active Problem List   Diagnosis Date Noted  . Bradycardia 04/01/2018  . UTI (urinary tract infection) 04/01/2018  . ARF (acute renal failure) ([place]) 04/01/2018  . Right ureteral stone 04/01/2018  . Acute diastolic heart failure ([place]) 04/01/2018  . Acute respiratory failure with hypoxia ([place])   . Pyelonephritis   . Severe sepsis ([place])   . Pyohydronephrosis 03/29/2018  . Septic shock ([place]) 03/29/2018  . Hypokalemia 03/28/2018  . Severe sepsis with septic shock ([place]) 03/28/2018  . Elevated lactic acid level 03/28/2018  . Elevated troponin 03/28/2018  . PSP (progressive supranuclear palsy) ([place]) 02/18/2018  . Chronic cough 11/19/2017  . 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 Cervical stenosis of spinal canal 03/23/2016  . OSA on CPAP 12/14/2015  . Dizziness and giddiness 11/24/2013  . Diabetes type 2, uncontrolled ([place]) 11/24/2013  . Severe obesity (BMI >= 40) ([place]) 11/24/2013  . Lumbar radiculopathy 11/24/2013    Orientation RESPIRATION BLADDER Height & Weight     Self, Time, Situation, Place  Normal(CPAP at night- adult mask nasal pillows, respiratory rate 16, flow rate 2lpm) Incontinent(JJ stent) Weight: 220 lb 3.8 oz (99.9 kg) Height:  5\' 5"  (165.1 cm)  BEHAVIORAL SYMPTOMS/MOOD NEUROLOGICAL BOWEL NUTRITION STATUS     Incontinent Diet(carb modified, thin fluid consistency)  AMBULATORY STATUS COMMUNICATION OF NEEDS Skin   Extensive Assist Verbally Normal                       Personal Care Assistance Level of Assistance  Bathing, Feeding, Dressing Bathing Assistance: Maximum assistance Feeding assistance: Limited assistance Dressing Assistance: Maximum assistance     Functional Limitations Info  Sight, Hearing, Speech Sight Info: Adequate Hearing Info: Adequate Speech Info: Adequate    SPECIAL CARE FACTORS FREQUENCY  PT (By licensed PT), OT (By licensed OT)     PT Frequency: 5x OT Frequency: 5x            Contractures Contractures Info: Not present    Additional Factors Info  Code Status, Allergies Code Status Info: partial Allergies Info: Aleve Naproxen, Zocor Simvastatin           Current Medications (04/04/2018):  This is the current hospital active medication list Current Facility-Administered Medications  Medication Dose Route Frequency Provider Last Rate Last Dose  . acetaminophen (TYLENOL) solution 650 mg  650 mg Oral Q6H PRN Lavina Hamman, MD   650 mg at 04/03/18 1446   Or  . acetaminophen (TYLENOL) suppository 650 mg  650 mg Rectal Q6H PRN Lavina Hamman, MD      . azelastine (ASTELIN) 0.1 % nasal spray 1 spray  1 spray Each Nare BID Lavina Hamman, MD   1 spray at 04/04/18 1049  . carbidopa-levodopa (SINEMET IR) 25-100 MG per tablet immediate release 2 tablet  2 tablet Oral TID Lavina Hamman, MD   2 tablet at 04/04/18 [phone]  . cefdinir (OMNICEF) capsule 300 mg  300 mg Oral Q12H Lavina Hamman, MD   300 mg at 04/03/18 2238  . enoxaparin (LOVENOX) injection 40 mg  40 mg Subcutaneous Daily Lavina Hamman, MD   40 mg at 04/04/18 1049  . escitalopram (LEXAPRO) tablet 15 mg  15 mg Oral QHS Lavina Hamman, MD   15 mg at 04/03/18 2238  . famotidine (PEPCID) tablet 20 mg  20 mg Oral BID Lavina Hamman, MD   20 mg at 04/04/18 1049  . fluticasone (FLONASE) 50  MCG/ACT nasal spray 2 spray  2 spray Each Nare Daily Lavina Hamman, MD   2 spray at 04/04/18 1049  . furosemide (LASIX) tablet 40 mg  40 mg Oral Daily Lavina Hamman, MD   40 mg at 04/04/18 1049  . insulin aspart (novoLOG) injection 0-15 Units  0-15 Units Subcutaneous TID WC Lavina Hamman, MD   3 Units at 04/03/18 1152  . insulin aspart (novoLOG) injection 0-5 Units  0-5 Units Subcutaneous QHS Lavina Hamman, MD      . loperamide (IMODIUM) capsule 2 mg  2 mg Oral PRN Lavina Hamman, MD   2 mg at 03/31/18 1533  . MEDLINE mouth rinse  15 mL Mouth Rinse BID Lavina Hamman, MD   15 mL at 04/03/18 2240  . montelukast (SINGULAIR) tablet 10 mg  10 mg Oral QHS Lavina Hamman, MD   10 mg at 04/03/18 2238  . ondansetron (ZOFRAN) injection 4 mg  4 mg Intravenous Q6H PRN Lavina Hamman, MD         Discharge Medications: Please see discharge summary for a list of discharge medications.  Relevant Imaging Results:  Relevant Lab Results:   Additional Information SS# [id number]  Nila Nephew, LCSW

## 2018-04-04 NOTE — Clinical Social Work Note (Addendum)
Per [employer] representative, SNF authorization was denied by medical director, "Pt close to baseline mobility." Discussed with pt and her son, state they would instead like to have home health if covered by insurance (and pt has caretakers in home- see below). CM and attending updated.  Clinical Social Work Assessment  Patient Details  Name: [name] [name] MRN: [id number] Date of Birth: 1951/03/25  Date of referral:  04/04/18               Reason for consult:  Discharge Planning, Facility Placement                Permission sought to share information with:  Family Supports, Chartered certified accountant granted to share information::  Yes, Verbal Permission Granted  Name::     Son Music therapist::  [place]  Relationship::     Contact Information:     Housing/Transportation Living arrangements for the past 2 months:  Charity fundraiser of Information:  Patient, Adult Children Patient Interpreter Needed:  None Criminal Activity/Legal Involvement Pertinent to Current Situation/Hospitalization:  No - Comment as needed Significant Relationships:  Adult Children, Warehouse manager, Friend Lives with:  Self Do you feel safe going back to the place where you live?  Yes Need for family participation in patient care:  No (Coment)  Care giving concerns:  Pt admitted from her independent living apartment at the [facility] where she has lived for about a year. Has a caretaker who stays with her about 11hours each night and a friend who comes during the day to assist her. Uses wheelchair at baseline and needs assistance transferring into it (x1 assist) and with all ADLs (can feed self). Admitted for septic shock- had beed treated for UTI at PCP and was found to have pyelonephritis and obstructive stone- has had ureteral stent placed during hospitalization.   Social Worker assessment / plan:  CSW consulted to assist with disposition  as SNF recommended per therapy session. Met with pt at bedside- alert, oriented x 4. States she has extensive assistance at home but that she only has 1 person there at a time (and there are times of day when she is alone) therefore she feels if she could "get a little better at transferring at rehab" she could benefit.  Discussed placement process with pt and need for [employer] prior auth to cover SNF. Pt understanding. Submitted for PASRR- went to manual review. Will provide additional information requested. Initiated Ship broker request.  Also updated son- he requested [employer] [facility]) if available.   Plan: Pursuing SNF for ST rehab Barriers: pasrr, insurance auth (representative confirmed request likely to go to Market researcher for review due to pt needing maximum assistance at baseline  Employment status:  Retired Forensic scientist:  Managed Medicare([employer]) PT Recommendations:  [place] / Referral to community resources:  [place]  Patient/Family's Response to care:  Pt and son appreciative  Patient/Family's Understanding of and Emotional Response to Diagnosis, Current Treatment, and Prognosis:  Pt shows good understanding of her home care needs and assistance needed at DC. Emotionally calm and positive and realistic "I do need a lot of help already."  Emotional Assessment Appearance:  Appears stated age Attitude/Demeanor/Rapport:  Engaged Affect (typically observed):  Accepting, Adaptable, Calm Orientation:  Oriented to Self, Oriented to Place, Oriented to  Time, Oriented to Situation Alcohol / Substance use:  Not Applicable Psych involvement (Current and /or in the  community):  No (Comment)  Discharge Needs  Concerns to be addressed:  Care Coordination, Discharge Planning Concerns Readmission within the last 30 days:  No Current discharge risk:  Dependent with  Mobility Barriers to Discharge:  Continued Medical Work up, [employer], LCSW 04/04/2018, 12:06 PM  [phone]

## 2018-04-04 NOTE — Plan of Care (Signed)
  Problem: Elimination: Goal: Will not experience complications related to urinary retention Outcome: Progressing   Problem: Safety: Goal: Ability to remain free from injury will improve Outcome: Progressing   Problem: Skin Integrity: Goal: Risk for impaired skin integrity will decrease Outcome: Progressing   Problem: Clinical Measurements: Goal: Diagnostic test results will improve Outcome: Progressing Goal: Signs and symptoms of infection will decrease Outcome: Progressing   Problem: Respiratory: Goal: Ability to maintain adequate ventilation will improve Outcome: Progressing

## 2018-04-04 NOTE — Progress Notes (Signed)
PROGRESS NOTE    [name] [name]  YQI:[id number] DOB: [birth year] DOA: 03/28/2018 PCP: [name], MD   Brief Narrative:  [phone] with hx DM, OSA, HTN, Depression, OA, Multiple system atrophy, and Nephrolithiasis, presented to the ER on 5/17 after she was found unresponsive by her family. Prior to being found unresponsive, she was seen by her PCP and diagnosed with a UTI. She was sent home with a prescription for ciprofloxacin. She took 1 dose of the cipro after which family came home and found her minimally responsive. EMS was called and found patient to be hypotensive and tachycardic. She was brought to the ER where she was found to have a temperature of 104 and was found to be in septic shock due to obstructive uropathy. CT Abdomen revealed pyelonephritis and a 85mm obstructive stone in the right ureter. She was taken to the OR where Urology placed a ureteral stent. She returned to the ICU and was requiring vasopressors, so PCCM was consulted. Patient noted to have worsening hypoxia and increased work of breathing and subsequently intubated.  Patient placed empirically on IV antibiotics and pancultured.  Patient subsequently extubated 03/30/2018.  Pressors subsequently discontinued 03/31/2018.  Patient transferred to [facility] 04/01/2018.  Assessment & Plan:   Principal Problem:   Severe sepsis with septic shock (HCC) Active Problems:   Diabetes type 2, uncontrolled (HCC)   OSA on CPAP   PSP (progressive supranuclear palsy) (HCC)   Hypokalemia   Elevated lactic acid level   Elevated troponin   Pyohydronephrosis   Septic shock (HCC)   Bradycardia   UTI (urinary tract infection)   ARF (acute renal failure) (HCC)   Right ureteral stone   Acute respiratory failure with hypoxia (HCC)   Pyelonephritis   Severe sepsis (HCC)   Acute diastolic heart failure (HCC)  1 severe sepsis with septic shock secondary to urosepsis/pyelonephritis/UTI Patient currently off pressors with  improvement with blood pressure. Patient pancultured with blood cultures with no growth to date. Urine cultures negative. Patient status post JJ stent placement per urology 03/29/2018. Patient was on IV vancomycin and IV Zosyn and then just on IV Zosyn. Narrow IV antibiotics to IV Rocephin.  Will change to oral Omnicef and monitor.  Last day antibiotic on 04/04/2018. Patient was given steroids for stress dose, now completed.  2.  Status post vent dependent respiratory failure/acute respiratory failure with hypoxia Acute on chronic diastolic CHF. Patient extubated 03/30/2018.  Clinically improved.  With sats of 97% on room air.  Follow. Continue IV diuresis.  3.  Right urethral stone status post JJ stent placement 03/29/2018/hydronephrosis Per urology.  Cultures with no growth to date.  Urine cultures with insignificant growth.  Narrow down IV antibiotics from IV Zosyn to IV Rocephin and treat for total of 7 to 10 days.  Foley catheter removed per urology.  4.  Acute renal failure Baseline creatinine less than 1, 2 years ago.  Likely secondary to a prerenal azotemia as patient noted to be hypotensive requiring pressors in the setting of IV vancomycin and hydronephrosis. Creatinine almost back to preadmission creatinine.  Will monitor.  5.  Hypokalemia Replete.  6.  Bradycardia/pauses/Elevated troponin Patient noted to be bradycardic with heart rates in the 30s overnight with multiple pauses noted.  Patient denies any chest pain or shortness of breath.  Patient not on any rate limiting medications. Cardiology consulted, appreciate input.  Avoid AV nodal blocking agent.  No indication for any further work-up or treatment at present  7.  Diarrhea Improved.  Patient now with mushy soft stools.  Imodium as needed.  8.  Obstructive sleep apnea CPAP nightly.  9.  Acute metabolic encephalopathy Improved.  10.  Multiple system atrophy/ Depression Continue Sinemet and Lexapro.  11.  Diabetes  mellitus II A1c 6.3.  Sliding scale insulin.    DVT prophylaxis: Lovenox Code Status: Full Family Communication: updated patient.  No family at bedside. Disposition Plan: Transfer to telemetry, likely discharge to SNF tomorrow day after.  Consultants:   PCCM: Dr. Jimmey Ralph 03/29/2018  Urology: Dr. Fredric Dine. Tresa Moore 03/28/2018  Cardiology Dr Burt Knack  Procedures:   CT renal stone protocol 03/28/2018  Chest x-ray 03/30/2018, 03/29/2018, 03/28/2018  Abdominal films 03/28/2018  Cystoscopy with right retrograde pyelogram and interpretation of right ureteral stent placement 03/29/2018 per Dr. Tresa Moore  Right IJ CVL 03/29/2018  ETT 03/29/2018>>>>> 03/30/2018  Antimicrobials:   IV Zosyn 03/28/2018>>>>>> 04/01/2018  IV vancomycin 03/28/2018>>>>>> 03/30/2018  IV Rocephin 04/01/2018   Subjective: Complains of having shortness of breath.  Transition to oral Lasix yesterday.  No nausea no vomiting no chest pain.  Objective: Vitals:   04/03/18 2008 04/03/18 2241 04/04/18 0607 04/04/18 1407  BP:  137/60 140/62 128/60  Pulse: (!) 50 (!) 54 (!) 54 (!) 48  Resp: 16 16 20 18   Temp:  99.1 F (37.3 C) 98.7 F (37.1 C) 98.3 F (36.8 C)  TempSrc:  Oral Oral Oral  SpO2: 96% 95% 95% 95%  Weight:   99.9 kg (220 lb 3.8 oz)   Height:        Intake/Output Summary (Last 24 hours) at 04/04/2018 1832 Last data filed at 04/04/2018 1634 Gross per 24 hour  Intake 1320 ml  Output 700 ml  Net 620 ml   Filed Weights   04/02/18 0500 04/03/18 0500 04/04/18 0607  Weight: 103.8 kg (228 lb 13.4 oz) 99.8 kg (220 lb 0.3 oz) 99.9 kg (220 lb 3.8 oz)    Examination:  General exam: Appears calm and comfortable  Respiratory system: Clear to auscultation. Respiratory effort normal. Cardiovascular system: Bradycardic.  No JVD, no murmurs, no rubs, no gallops.  Bilateral lower extremity edema.  Gastrointestinal system: Abdomen is nondistended, soft and nontender. No organomegaly or masses felt. Normal bowel sounds  heard. Central nervous system: Alert and oriented. No focal neurological deficits. Extremities: Symmetric 5 x 5 power. Skin: No rashes, lesions or ulcers Psychiatry: Judgement and insight appear normal. Mood & affect appropriate.     Data Reviewed: I have personally reviewed following labs and imaging studies  CBC: Recent Labs  Lab 03/28/18 2052  03/30/18 0430 03/31/18 0417 04/01/18 0316 04/02/18 0331 04/03/18 0753  WBC 2.6*   < > 19.2* 21.8* 14.5* 11.9* 11.2*  NEUTROABS 2.2  --   --   --   --  9.9* 8.7*  HGB 12.7   < > 11.1* 10.8* 10.6* 11.3* 12.0  HCT 39.2   < > 33.7* 32.8* 32.5* 34.8* 37.2  MCV 89.5   < > 88.0 88.6 89.0 88.8 90.1  PLT 142*   < > 124* 129* 121* 168 231   < > = values in this interval not displayed.   Basic Metabolic Panel: Recent Labs  Lab 03/29/18 0500 03/29/18 1226 03/29/18 1620 03/30/18 0430 03/31/18 0417 04/01/18 0316 04/02/18 0331 04/03/18 0753 04/04/18 0807  NA 139  --   --  136 143 146* 145 148* 147*  K 3.1*  --   --  3.5 3.6 3.2* 3.0* 2.8* 3.4*  CL 107  --   --  99* 104 108 105 102 104  CO2 19*  --   --  22 26 27 27 31 30   GLUCOSE 145*  --   --  262* 113* 145* 175* 125* 120*  BUN 24*  --   --  37* 38* 39* 42* 40* 32*  CREATININE 1.78*  --   --  1.88* 1.75* 1.61* 1.58* 1.33* 1.24*  CALCIUM 8.0*  --   --  8.0* 8.0* 8.8* 8.7* 8.8* 8.6*  MG 1.2* 2.2 2.2 2.2 2.4  --  2.0 1.7 1.7  PHOS 2.6 3.9 3.6 2.8 2.3*  --   --   --   --    GFR: Estimated Creatinine Clearance: 52.3 mL/min (A) (by C-G formula based on SCr of 1.24 mg/dL (H)). Liver Function Tests: Recent Labs  Lab 03/28/18 2030 04/02/18 0331  AST 34 15  ALT 10* 5*  ALKPHOS 111 73  BILITOT 1.3* 1.1  PROT 6.8 5.8*  ALBUMIN 3.4* 2.5*   No results for input(s): LIPASE, AMYLASE in the last 168 hours. No results for input(s): AMMONIA in the last 168 hours. Coagulation Profile: No results for input(s): INR, PROTIME in the last 168 hours. Cardiac Enzymes: Recent Labs  Lab  03/29/18 1539 03/29/18 2030 04/01/18 0930 04/01/18 1518 04/01/18 2104  TROPONINI 0.76* 0.83* 0.18* 0.17* 0.15*   BNP (last 3 results) No results for input(s): PROBNP in the last 8760 hours. HbA1C: Recent Labs    04/02/18 0331  HGBA1C 6.3*   CBG: Recent Labs  Lab 04/03/18 1627 04/03/18 2236 04/04/18 0755 04/04/18 1139 04/04/18 1632  GLUCAP 116* 124* 109* 139* 99   Lipid Profile: No results for input(s): CHOL, HDL, LDLCALC, TRIG, CHOLHDL, LDLDIRECT in the last 72 hours. Thyroid Function Tests: No results for input(s): TSH, T4TOTAL, FREET4, T3FREE, THYROIDAB in the last 72 hours. Anemia Panel: No results for input(s): VITAMINB12, FOLATE, FERRITIN, TIBC, IRON, RETICCTPCT in the last 72 hours. Sepsis Labs: Recent Labs  Lab 03/29/18 0203 03/29/18 0243 03/29/18 0428 03/29/18 0823 03/29/18 1226 03/30/18 0430 03/31/18 0417  PROCALCITON  --  >150.00  --   --   --  >150.00 109.41  LATICACIDVEN 3.7*  --  3.0* 2.9* 2.6*  --   --     Recent Results (from the past 240 hour(s))  Blood Culture (routine x 2)     Status: None   Collection Time: 03/28/18  8:10 PM  Result Value Ref Range Status   Specimen Description   Final    BLOOD LEFT FOREARM Performed at [facility], [place] [address] [place], [place] [address]    Special Requests   Final    BOTTLES DRAWN AEROBIC AND ANAEROBIC Blood Culture adequate volume Performed at [facility] [address] [place], [place] [address]    Culture   Final    NO GROWTH 5 DAYS Performed at [facility], [place] [address] [place], [place] [address]    Report Status 04/02/2018 FINAL  Final  Blood Culture (routine x 2)     Status: None   Collection Time: 03/28/18  8:20 PM  Result Value Ref Range Status   Specimen Description   Final    BLOOD RIGHT FOREARM Performed at [facility] [address], [place], [place] [address]    Special Requests   Final    BOTTLES DRAWN  AEROBIC AND ANAEROBIC Blood Culture adequate volume Performed at [place] [address], [place], [place] [address]  Culture   Final    NO GROWTH 5 DAYS Performed at [facility], [place] [address] [place], [place] [address]    Report Status 04/02/2018 FINAL  Final  Urine culture     Status: None   Collection Time: 03/28/18  8:51 PM  Result Value Ref Range Status   Specimen Description   Final    URINE, CATHETERIZED Performed at [facility], [place] [address] [place], [place] [address]    Special Requests   Final    NONE Performed at [facility], [place] [address] [name], [place] [address]    Culture   Final    NO GROWTH Performed at [facility], [place] [address] [place], [place] [address]    Report Status 03/30/2018 FINAL  Final  Urine Culture     Status: Abnormal   Collection Time: 03/29/18 12:21 AM  Result Value Ref Range Status   Specimen Description   Final    URINE, RANDOM Performed at [place] [address] [place], [place] [address]    Special Requests   Final    NONE Performed at [facility], [place] [address] [place], [place] [address]    Culture (A)  Final    <10,000 COLONIES/mL INSIGNIFICANT GROWTH Performed at [place] [address], [place], [place] [address]    Report Status 03/30/2018 FINAL  Final  MRSA PCR Screening     Status: None   Collection Time: 03/29/18  1:57 AM  Result Value Ref Range Status   MRSA by PCR NEGATIVE NEGATIVE Final    Comment:        The GeneXpert MRSA Assay (FDA approved for NASAL specimens only), is one component of a comprehensive MRSA colonization surveillance program. It is not intended to diagnose MRSA infection nor to guide or monitor treatment for MRSA infections. Performed at [facility], [place] [address], [place], [place] [address]   Urine Culture     Status: None    Collection Time: 03/29/18  2:55 AM  Result Value Ref Range Status   Specimen Description   Final    URINE, RANDOM Performed at [facility] [address] [place], [place] [address]    Special Requests   Final    NONE Performed at [facility], [place] [address], [place], [place] [address]    Culture   Final    NO GROWTH Performed at [facility], [place] [address] [place],  [address]    Report Status 03/30/2018 FINAL  Final         Radiology Studies: No results found.      Scheduled Meds: . azelastine  1 spray Each Nare BID  . carbidopa-levodopa  2 tablet Oral TID  . cefdinir  300 mg Oral Q12H  . enoxaparin (LOVENOX) injection  40 mg Subcutaneous Daily  . escitalopram  15 mg Oral QHS  . famotidine  20 mg Oral BID  . fluticasone  2 spray Each Nare Daily  . furosemide  40 mg Intravenous BID  . insulin aspart  0-15 Units Subcutaneous TID WC  . insulin aspart  0-5 Units Subcutaneous QHS  . mouth rinse  15 mL Mouth Rinse BID  . montelukast  10 mg Oral QHS  . potassium chloride  20 mEq Oral BID   Continuous Infusions:    LOS: 6 days    Time spent: 69minutes  Berle Mull, MD [facility]  If 7PM-7AM, please contact night-coverage [email or web address] Password [facility] 04/04/2018, 6:32 PM

## 2018-04-04 NOTE — Progress Notes (Signed)
Assumed care of Pt from previous nurse. Agree with previous nurses assessment. Will continue to monitor.

## 2018-04-04 NOTE — Progress Notes (Signed)
Pt discharging home with [facility]. Referral given to in house rep.

## 2018-04-04 NOTE — Progress Notes (Signed)
Physical Therapy Treatment Patient Details Name: [name] [name] MRN: [id number] DOB: 05/24/1951 Today's Date: 04/04/2018    History of Present Illness 67 year old female admitted with severe sepsis with septic shock; family found her unresponsive.  Has had uti.  PMH:  DM, progressive supranuclear palsy, polyneuropathy, multiple system atrophy, HTN    PT Comments    On arrival, pt with extensive soft bowel movement.  RN in to assist with pericare.  Pt assisted with rolling and cued to assist as much as possible.  After rolling and hygiene performed pt reported fatigue and unable to further participate.  Pt reports she is from ILF and can typically transfer to her w/c.  However, pt also reports her friend assists her with feeding.  (she requested assist for drinking water).  Continue to recommend d/c to SNF.   Follow Up Recommendations  SNF     Equipment Recommendations  None recommended by PT    Recommendations for Other Services       Precautions / Restrictions Precautions Precautions: Fall    Mobility  Bed Mobility Overal bed mobility: Needs Assistance Bed Mobility: Rolling Rolling: Mod assist;+2 for physical assistance         General bed mobility comments: pt assisted with reaching UE across for rail to self assist, mod assist at lower body for rolling, performed x2 each direction for pericare and changing linen  Transfers                    Ambulation/Gait                 Stairs             Wheelchair Mobility    Modified Rankin (Stroke Patients Only)       Balance                                            Cognition Arousal/Alertness: Awake/alert Behavior During Therapy: WFL for tasks assessed/performed Overall Cognitive Status: Within Functional Limits for tasks assessed                                 General Comments: pt following commands and answering questions      Exercises       General Comments        Pertinent Vitals/Pain Pain Assessment: No/denies pain    Home Living                      Prior Function            PT Goals (current goals can now be found in the care plan section) Progress towards PT goals: Progressing toward goals    Frequency    Min 2X/week      PT Plan Current plan remains appropriate    Co-evaluation              AM-PAC PT "6 Clicks" Daily Activity  Outcome Measure  Difficulty turning over in bed (including adjusting bedclothes, sheets and blankets)?: Unable Difficulty moving from lying on back to sitting on the side of the bed? : Unable Difficulty sitting down on and standing up from a chair with arms (e.g., wheelchair, bedside commode, etc,.)?: Unable Help needed moving to and from a bed to chair (including  a wheelchair)?: Total Help needed walking in hospital room?: Total Help needed climbing 3-5 steps with a railing? : Total 6 Click Score: 6    End of Session   Activity Tolerance: Patient limited by fatigue Patient left: in bed;with bed alarm set;with call bell/phone within reach Nurse Communication: Mobility status PT Visit Diagnosis: Muscle weakness (generalized) (M62.81);Difficulty in walking, not elsewhere classified (R26.2)     Time: 1694-5038 PT Time Calculation (min) (ACUTE ONLY): 27 min  Charges:  $Therapeutic Activity: 8-22 mins                    G Codes:       Carmelia Bake, PT, DPT 04/04/2018 Pager: [phone]  [place] Ram E 04/04/2018, 12:51 PM

## 2018-04-05 LAB — GLUCOSE, CAPILLARY
Glucose-Capillary: 108 mg/dL — ABNORMAL HIGH (ref 65–99)
Glucose-Capillary: 205 mg/dL — ABNORMAL HIGH (ref 65–99)
Glucose-Capillary: 92 mg/dL (ref 65–99)
Glucose-Capillary: 143 mg/dL — ABNORMAL HIGH (ref 65–99)

## 2018-04-05 LAB — BASIC METABOLIC PANEL
Anion gap: 13 (ref 5–15)
BUN: 25 mg/dL — ABNORMAL HIGH (ref 6–20)
CALCIUM: 8.7 mg/dL — AB (ref 8.9–10.3)
CO2: 30 mmol/L (ref 22–32)
CREATININE: 1.21 mg/dL — AB (ref 0.44–1.00)
Chloride: 100 mmol/L — ABNORMAL LOW (ref 101–111)
GFR calc non Af Amer: 46 mL/min — ABNORMAL LOW (ref >60)
GFR, EST AFRICAN AMERICAN: 53 mL/min — AB (ref >60)
GLUCOSE: 123 mg/dL — AB (ref 65–99)
Potassium: 3.2 mmol/L — ABNORMAL LOW (ref 3.5–5.1)
Sodium: 143 mmol/L (ref 135–145)

## 2018-04-05 MED ORDER — FAMOTIDINE 20 MG PO TABS
20.0000 mg | ORAL_TABLET | Freq: Every day | ORAL | Status: DC
  Administered 2018-04-06 – 2018-04-08 (×3): 20 mg via ORAL
  Filled 2018-04-05 (×3): qty 1

## 2018-04-05 MED ORDER — SACCHAROMYCES BOULARDII 250 MG PO CAPS
250.0000 mg | ORAL_CAPSULE | Freq: Two times a day (BID) | ORAL | Status: DC
  Administered 2018-04-05 – 2018-04-08 (×7): 250 mg via ORAL
  Filled 2018-04-05 (×7): qty 1

## 2018-04-05 MED ORDER — FUROSEMIDE 10 MG/ML IJ SOLN: 40.0000 mg | Freq: Every day | INTRAMUSCULAR | Status: DC

## 2018-04-05 NOTE — Progress Notes (Signed)
Spoke with pt's son and sister via phone- they express frustration at [employer] denial for skilled nursing rehab admission. CSW discussed with them at length the nature of short term rehab treatment and that insurance factors how far pt is from reported baseline and potential to progress greatly in rehab when making denial. Both son and sister understanding, however they report they are concerned that pt will need more help than she has in home ("she is needing help with turning over in bed and with feeding herself"). CSW explained these needs are considered personal caregiving needs/custodial needs and whether provided in facilities or at home are typically out-of-pocket services (custodial care/Long term care at SNF and provate caregivers at home). Son asks that if appropriate, once pt stable for DC, SNF insurance auth request be re-submitted. CSW advised will pursue if appropriate with team.   Sharren Bridge, MSW, LCSW Clinical Social Work 04/05/2018 [phone] weekend coverage [phone]

## 2018-04-05 NOTE — Progress Notes (Signed)
Vitals OK reviwed medical notes Feeding  Making urine F/up with Dr Tresa [place] as outpt

## 2018-04-05 NOTE — Progress Notes (Signed)
PROGRESS NOTE    [name] [name]  ZOX:[id number] DOB: 28-Jan-1951 DOA: 03/28/2018 PCP: [name], MD   Brief Narrative:  [phone] with hx DM, OSA, HTN, Depression, OA, Multiple system atrophy, and Nephrolithiasis, presented to the ER on 5/17 after she was found unresponsive by her family. Prior to being found unresponsive, she was seen by her PCP and diagnosed with a UTI. She was sent home with a prescription for ciprofloxacin. She took 1 dose of the cipro after which family came home and found her minimally responsive. EMS was called and found patient to be hypotensive and tachycardic. She was brought to the ER where she was found to have a temperature of 104 and was found to be in septic shock due to obstructive uropathy. CT Abdomen revealed pyelonephritis and a 85mm obstructive stone in the right ureter. She was taken to the OR where Urology placed a ureteral stent. She returned to the ICU and was requiring vasopressors, so PCCM was consulted. Patient noted to have worsening hypoxia and increased work of breathing and subsequently intubated.  Patient placed empirically on IV antibiotics and pancultured.  Patient subsequently extubated 03/30/2018.  Pressors subsequently discontinued 03/31/2018.  [facility] transferred to [facility] 04/01/2018.  Assessment & Plan:   Principal Problem:   Severe sepsis with septic shock (HCC) Active Problems:   Diabetes type 2, uncontrolled (HCC)   OSA on CPAP   PSP (progressive supranuclear palsy) (HCC)   Hypokalemia   Elevated lactic acid level   Elevated troponin   Pyohydronephrosis   Septic shock (HCC)   Bradycardia   UTI (urinary tract infection)   ARF (acute renal failure) (HCC)   Right ureteral stone   Acute respiratory failure with hypoxia (HCC)   Pyelonephritis   Severe sepsis (HCC)   Acute diastolic heart failure (HCC)  1 severe sepsis with septic shock secondary to urosepsis/pyelonephritis/UTI, currently resolved Patient currently  off pressors with improvement with blood pressure. Patient pancultured with blood cultures with no growth to date. Urine cultures negative. Patient status post JJ stent placement per urology 03/29/2018. Patient was on IV vancomycin and IV Zosyn and then just on IV Zosyn. Narrow IV antibiotics to IV Rocephin.  Will change to oral Omnicef and monitor.  Last day antibiotic on 04/04/2018. Patient was given steroids for stress dose, now completed.  2.  Status post vent dependent respiratory failure/acute respiratory failure with hypoxia Acute on chronic diastolic CHF. Patient extubated 03/30/2018.  Clinically improved.  With sats of 97% on room air.  Follow. Continue IV diuresis.  Reduce the frequency from twice daily to daily. Anticipating transition to oral tomorrow.  3.  Right urethral stone status post JJ stent placement 03/29/2018/hydronephrosis Per urology.  Cultures with no growth to date.  Urine cultures with insignificant growth.  Foley catheter removed per urology.  4.  Acute renal failure Baseline creatinine less than 1, 2 years ago.  Likely secondary to a prerenal azotemia as patient noted to be hypotensive requiring pressors in the setting of IV vancomycin and hydronephrosis. Creatinine almost back to preadmission creatinine.  Will monitor.  5.  Hypokalemia Replete.  6.  Bradycardia/pauses/Elevated troponin Patient noted to be bradycardic with heart rates in the 30s overnight with multiple pauses noted.  Patient denies any chest pain or shortness of breath.  Patient not on any rate limiting medications. Cardiology consulted, appreciate input.  Avoid AV nodal blocking agent.  No indication for any further work-up or treatment at present  7.  Diarrhea Improved.  No evidence of abdominal pain nausea or vomiting.  No active bleeding as well. Continue PRN Imodium.  Add probiotics.  All the antibiotics are stopped anticipating diarrhea to improve rapidly.  8.  Obstructive sleep  apnea CPAP nightly.  9.  Acute metabolic encephalopathy Improved.  10.  Multiple system atrophy/ Depression Continue Sinemet and Lexapro.  11.  Diabetes mellitus II A1c 6.3.  Sliding scale insulin.  DVT prophylaxis: Lovenox Code Status: Full Family Communication: updated patient.  family at bedside. Disposition Plan: Discharge to home on Monday.  Son has reservation and requesting discharge on Tuesday although he was informed that if the patient is medically stable to be discharged whenever, patient should be discharged from the hospital.  Consultants:   PCCM: Dr. Jimmey Ralph 03/29/2018  Urology: Dr. Fredric Dine. Tresa Moore 03/28/2018  Cardiology Dr Burt Knack  Procedures:   CT renal stone protocol 03/28/2018  Chest x-ray 03/30/2018, 03/29/2018, 03/28/2018  Abdominal films 03/28/2018  Cystoscopy with right retrograde pyelogram and interpretation of right ureteral stent placement 03/29/2018 per Dr. Tresa Moore  Right IJ CVL 03/29/2018  ETT 03/29/2018>>>>> 03/30/2018  Antimicrobials:   IV Zosyn 03/28/2018>>>>>> 04/01/2018  IV vancomycin 03/28/2018>>>>>> 03/30/2018  IV Rocephin 04/01/2018   Subjective: Breathing is better.  Continues to have diarrhea.  No nausea no vomiting.  No abdominal pain.  Objective: Vitals:   04/05/18 0458 04/05/18 0500 04/05/18 1300 04/05/18 1504  BP: 138/75   (!) 125/57  Pulse: 63   (!) 55  Resp: 17   18  Temp: 98.1 F (36.7 C)   98.7 F (37.1 C)  TempSrc: Oral   Oral  SpO2: 92%  91% 93%  Weight:  97.1 kg (214 lb 1.1 oz)    Height:        Intake/Output Summary (Last 24 hours) at 04/05/2018 1730 Last data filed at 04/05/2018 1724 Gross per 24 hour  Intake 360 ml  Output 650 ml  Net -290 ml   Filed Weights   04/03/18 0500 04/04/18 0607 04/05/18 0500  Weight: 99.8 kg (220 lb 0.3 oz) 99.9 kg (220 lb 3.8 oz) 97.1 kg (214 lb 1.1 oz)    Examination:  General exam: Appears calm and comfortable  Respiratory system: Clear to auscultation. Respiratory effort  normal. Cardiovascular system: Bradycardic.  No JVD, no murmurs, no rubs, no gallops.  Bilateral lower extremity edema.  Gastrointestinal system: Abdomen is nondistended, soft and nontender. No organomegaly or masses felt. Normal bowel sounds heard. Central nervous system: Alert and oriented. No focal neurological deficits. Extremities: Symmetric 5 x 5 power. Skin: No rashes, lesions or ulcers Psychiatry: Judgement and insight appear normal. Mood & affect appropriate.     Data Reviewed: I have personally reviewed following labs and imaging studies  CBC: Recent Labs  Lab 03/30/18 0430 03/31/18 0417 04/01/18 0316 04/02/18 0331 04/03/18 0753  WBC 19.2* 21.8* 14.5* 11.9* 11.2*  NEUTROABS  --   --   --  9.9* 8.7*  HGB 11.1* 10.8* 10.6* 11.3* 12.0  HCT 33.7* 32.8* 32.5* 34.8* 37.2  MCV 88.0 88.6 89.0 88.8 90.1  PLT 124* 129* 121* 168 798   Basic Metabolic Panel: Recent Labs  Lab 03/30/18 0430 03/31/18 0417 04/01/18 0316 04/02/18 0331 04/03/18 0753 04/04/18 0807 04/05/18 0836  NA 136 143 146* 145 148* 147* 143  K 3.5 3.6 3.2* 3.0* 2.8* 3.4* 3.2*  CL 99* 104 108 105 102 104 100*  CO2 22 26 27 27 31 30 30   GLUCOSE 262* 113* 145* 175* 125* 120* 123*  BUN 37*  38* 39* 42* 40* 32* 25*  CREATININE 1.88* 1.75* 1.61* 1.58* 1.33* 1.24* 1.21*  CALCIUM 8.0* 8.0* 8.8* 8.7* 8.8* 8.6* 8.7*  MG 2.2 2.4  --  2.0 1.7 1.7  --   PHOS 2.8 2.3*  --   --   --   --   --    GFR: Estimated Creatinine Clearance: 52.7 mL/min (A) (by C-G formula based on SCr of 1.21 mg/dL (H)). Liver Function Tests: Recent Labs  Lab 04/02/18 0331  AST 15  ALT 5*  ALKPHOS 73  BILITOT 1.1  PROT 5.8*  ALBUMIN 2.5*   No results for input(s): LIPASE, AMYLASE in the last 168 hours. No results for input(s): AMMONIA in the last 168 hours. Coagulation Profile: No results for input(s): INR, PROTIME in the last 168 hours. Cardiac Enzymes: Recent Labs  Lab 03/29/18 2030 04/01/18 0930 04/01/18 1518  04/01/18 2104  TROPONINI 0.83* 0.18* 0.17* 0.15*   BNP (last 3 results) No results for input(s): PROBNP in the last 8760 hours. HbA1C: No results for input(s): HGBA1C in the last 72 hours. CBG: Recent Labs  Lab 04/04/18 1139 04/04/18 1632 04/04/18 2136 04/05/18 0725 04/05/18 1136  GLUCAP 139* 99 125* 108* 205*   Lipid Profile: No results for input(s): CHOL, HDL, LDLCALC, TRIG, CHOLHDL, LDLDIRECT in the last 72 hours. Thyroid Function Tests: No results for input(s): TSH, T4TOTAL, FREET4, T3FREE, THYROIDAB in the last 72 hours. Anemia Panel: No results for input(s): VITAMINB12, FOLATE, FERRITIN, TIBC, IRON, RETICCTPCT in the last 72 hours. Sepsis Labs: Recent Labs  Lab 03/30/18 0430 03/31/18 0417  PROCALCITON >150.00 109.41    Recent Results (from the past 240 hour(s))  Blood Culture (routine x 2)     Status: None   Collection Time: 03/28/18  8:10 PM  Result Value Ref Range Status   Specimen Description   Final    BLOOD LEFT FOREARM Performed at [facility] [address] [place], [place] [address]    Special Requests   Final    BOTTLES DRAWN AEROBIC AND ANAEROBIC Blood Culture adequate volume Performed at [facility] [address], [place], [place] [address]    Culture   Final    NO GROWTH 5 DAYS Performed at [facility], [place] [address], [place], [place] [address]    Report Status 04/02/2018 FINAL  Final  Blood Culture (routine x 2)     Status: None   Collection Time: 03/28/18  8:20 PM  Result Value Ref Range Status   Specimen Description   Final    BLOOD RIGHT FOREARM Performed at [facility] [address] [place], [place] [address]    Special Requests   Final    BOTTLES DRAWN AEROBIC AND ANAEROBIC Blood Culture adequate volume Performed at [facility] [address], [place], [place] [address]    Culture   Final    NO GROWTH 5 DAYS Performed at [facility], [place] [address] [place], [place] [address]    Report Status 04/02/2018 FINAL  Final  Urine culture     Status: None   Collection Time: 03/28/18  8:51 PM  Result Value Ref Range Status   Specimen Description   Final    URINE, CATHETERIZED Performed at [facility], [place] [address] [place], [place] [address]    Special Requests   Final    NONE Performed at [facility], [place] [address] [place], [place]  [address]    Culture   Final    NO GROWTH Performed at [facility], [place] [address], [place], [place] [address]    Report Status 03/30/2018 FINAL  Final  Urine Culture     Status: Abnormal   Collection Time: 03/29/18 12:21 AM  Result Value Ref Range Status   Specimen Description   Final    URINE, RANDOM Performed at [facility] [address], [place], [place] [address]    Special Requests   Final    NONE Performed at [facility], [place] [address], [place], [place] [address]    Culture (A)  Final    <10,000 COLONIES/mL INSIGNIFICANT GROWTH Performed at [facility] [address], [place], [place] [address]    Report Status 03/30/2018 FINAL  Final  MRSA PCR Screening     Status: None   Collection Time: 03/29/18  1:57 AM  Result Value Ref Range Status   MRSA by PCR NEGATIVE NEGATIVE Final    Comment:        The GeneXpert MRSA Assay (FDA approved for NASAL specimens only), is one component of a comprehensive MRSA colonization surveillance program. It is not intended to diagnose MRSA infection nor to guide or monitor treatment for MRSA infections. Performed at [facility], [place] [address], [place], [place] [address]   Urine Culture     Status: None   Collection Time: 03/29/18  2:55 AM  Result Value Ref Range Status   Specimen Description   Final    URINE, RANDOM Performed at [facility] [address] [place], [place] [address]     Special Requests   Final    NONE Performed at [facility], [place] [address] [place], [place] [address]    Culture   Final    NO GROWTH Performed at [facility], [place] [address] [place], [place] [address]    Report Status 03/30/2018 FINAL  Final         Radiology Studies: No results found.      Scheduled Meds: . azelastine  1 spray Each Nare BID  . carbidopa-levodopa  2 tablet Oral TID  . enoxaparin (LOVENOX) injection  40 mg Subcutaneous Daily  . escitalopram  15 mg Oral QHS  . [START ON 04/06/2018] famotidine  20 mg Oral Daily  . fluticasone  2 spray Each Nare Daily  . [START ON 04/06/2018] furosemide  40 mg Intravenous Daily  . insulin aspart  0-15 Units Subcutaneous TID WC  . insulin aspart  0-5 Units Subcutaneous QHS  . mouth rinse  15 mL Mouth Rinse BID  . montelukast  10 mg Oral QHS  . potassium chloride  20 mEq Oral BID  . saccharomyces boulardii  250 mg Oral BID   Continuous Infusions:    LOS: 7 days    Time spent: 30minutes    Berle Mull, MD [facility]  If 7PM-7AM, please contact night-coverage [email or web address] Password [facility] 04/05/2018, 5:30 PM

## 2018-04-06 LAB — BASIC METABOLIC PANEL
Anion gap: 13 (ref 5–15)
BUN: 22 mg/dL — ABNORMAL HIGH (ref 6–20)
CHLORIDE: 98 mmol/L — AB (ref 101–111)
CO2: 29 mmol/L (ref 22–32)
Calcium: 8.9 mg/dL (ref 8.9–10.3)
Creatinine, Ser: 1.13 mg/dL — ABNORMAL HIGH (ref 0.44–1.00)
GFR calc non Af Amer: 50 mL/min — ABNORMAL LOW (ref >60)
GFR, EST AFRICAN AMERICAN: 57 mL/min — AB (ref >60)
Glucose, Bld: 110 mg/dL — ABNORMAL HIGH (ref 65–99)
POTASSIUM: 3.5 mmol/L (ref 3.5–5.1)
SODIUM: 140 mmol/L (ref 135–145)

## 2018-04-06 LAB — GLUCOSE, CAPILLARY
GLUCOSE-CAPILLARY: 154 mg/dL — AB (ref 65–99)
GLUCOSE-CAPILLARY: 122 mg/dL — AB (ref 65–99)
GLUCOSE-CAPILLARY: 126 mg/dL — AB (ref 65–99)

## 2018-04-06 MED ORDER — FUROSEMIDE 40 MG PO TABS
40.0000 mg | ORAL_TABLET | Freq: Every day | ORAL | Status: DC
  Administered 2018-04-06 – 2018-04-08 (×3): 40 mg via ORAL
  Filled 2018-04-06 (×3): qty 1

## 2018-04-06 NOTE — Progress Notes (Signed)
PROGRESS NOTE    [name] [name]  VPX:[id number] DOB: [birth year] DOA: 03/28/2018 PCP: [name], MD   Brief Narrative:  [id number] with hx DM, OSA, HTN, Depression, OA, Multiple system atrophy, and Nephrolithiasis, presented to the ER on 5/17 after she was found unresponsive by her family. Prior to being found unresponsive, she was seen by her PCP and diagnosed with a UTI. She was sent home with a prescription for ciprofloxacin. She took 1 dose of the cipro after which family came home and found her minimally responsive. EMS was called and found patient to be hypotensive and tachycardic. She was brought to the ER where she was found to have a temperature of 104 and was found to be in septic shock due to obstructive uropathy. CT Abdomen revealed pyelonephritis and a 22mm obstructive stone in the right ureter. She was taken to the OR where Urology placed a ureteral stent. She returned to the ICU and was requiring vasopressors, so PCCM was consulted. Patient noted to have worsening hypoxia and increased work of breathing and subsequently intubated.  Patient placed empirically on IV antibiotics and pancultured.  Patient subsequently extubated 03/30/2018.  Pressors subsequently discontinued 03/31/2018.  [facility] transferred to [facility] 04/01/2018.  Assessment & Plan:   Principal Problem:   Severe sepsis with septic shock (HCC) Active Problems:   Diabetes type 2, uncontrolled (HCC)   OSA on CPAP   PSP (progressive supranuclear palsy) (HCC)   Hypokalemia   Elevated lactic acid level   Elevated troponin   Pyohydronephrosis   Septic shock (HCC)   Bradycardia   UTI (urinary tract infection)   ARF (acute renal failure) (HCC)   Right ureteral stone   Acute respiratory failure with hypoxia (HCC)   Pyelonephritis   Severe sepsis (HCC)   Acute diastolic heart failure (HCC)  1 severe sepsis with septic shock secondary to urosepsis/pyelonephritis/UTI, currently resolved Patient currently  off pressors with improvement with blood pressure. Patient pancultured with blood cultures with no growth to date. Urine cultures negative. Patient status post JJ stent placement per urology 03/29/2018. Patient was on IV vancomycin and IV Zosyn and then just on IV Zosyn. Narrow IV antibiotics to IV Rocephin.  Will change to oral Omnicef and monitor.  Last day antibiotic on 04/04/2018. Patient was given steroids for stress dose, now completed.  2.  Status post vent dependent respiratory failure/acute respiratory failure with hypoxia Acute on chronic diastolic CHF. Patient extubated 03/30/2018.  Clinically improved.  With sats of 97% on room air.  Follow. Patient received IV Lasix. Now appears to be euvolemic. Transition to oral Lasix.  Monitor.  3.  Right urethral stone status post JJ stent placement 03/29/2018/hydronephrosis Per urology.  Cultures with no growth to date.  Urine cultures with insignificant growth.  Foley catheter removed per urology.  4.  Acute renal failure Baseline creatinine less than 1, 2 years ago.  Likely secondary to a prerenal azotemia as patient noted to be hypotensive requiring pressors in the setting of IV vancomycin and hydronephrosis. Creatinine almost back to preadmission creatinine.  Will monitor.  5.  Hypokalemia Replete.  6.  Bradycardia/pauses/Elevated troponin Patient noted to be bradycardic with heart rates in the 30s overnight with multiple pauses noted.  Patient denies any chest pain or shortness of breath.  Patient not on any rate limiting medications. Cardiology consulted, appreciate input.  Avoid AV nodal blocking agent.  No indication for any further work-up or treatment at present  7.  Diarrhea Improved.  No evidence  of abdominal pain nausea or vomiting.  No active bleeding as well. Continue PRN Imodium.  Add probiotics.  All the antibiotics are stopped anticipating diarrhea to improve rapidly.  8.  Obstructive sleep apnea CPAP nightly.  9.   Acute metabolic encephalopathy Improved.  10.  Multiple system atrophy/ Depression Continue Sinemet and Lexapro.  11.  Diabetes mellitus II well controlled, no complication S0F 6.3.  Sliding scale insulin.  DVT prophylaxis: Lovenox Code Status: Full Family Communication: updated patient.  family at bedside. Disposition Plan: Discharge to home on Monday.  Son has reservation and requesting discharge on Tuesday although he was informed that if the patient is medically stable to be discharged whenever, patient should be discharged from the hospital.  Consultants:   PCCM: Dr. Jimmey Ralph 03/29/2018  Urology: Dr. Fredric Dine. Tresa Moore 03/28/2018  Cardiology Dr Burt Knack  Procedures:   CT renal stone protocol 03/28/2018  Chest x-ray 03/30/2018, 03/29/2018, 03/28/2018  Abdominal films 03/28/2018  Cystoscopy with right retrograde pyelogram and interpretation of right ureteral stent placement 03/29/2018 per Dr. Tresa Moore  Right IJ CVL 03/29/2018  ETT 03/29/2018>>>>> 03/30/2018  Antimicrobials:   IV Zosyn 03/28/2018>>>>>> 04/01/2018  IV vancomycin 03/28/2018>>>>>> 03/30/2018  IV Rocephin 04/01/2018   Subjective: Continues to feel better.  No nausea no vomiting no fever no chills.  No chest pain no abdominal pain.  Objective: Vitals:   04/05/18 2037 04/06/18 0454 04/06/18 0457 04/06/18 1356  BP: 133/63  (!) 141/73 130/69  Pulse: 60  (!) 59 (!) 57  Resp: 20  (!) 22 18  Temp: 98.2 F (36.8 C)  98.3 F (36.8 C) 97.9 F (36.6 C)  TempSrc: Oral  Oral Oral  SpO2: 95%  98% 98%  Weight:  96.5 kg (212 lb 11.9 oz)    Height:        Intake/Output Summary (Last 24 hours) at 04/06/2018 1900 Last data filed at 04/06/2018 1356 Gross per 24 hour  Intake -  Output 650 ml  Net -650 ml   Filed Weights   04/04/18 0607 04/05/18 0500 04/06/18 0454  Weight: 99.9 kg (220 lb 3.8 oz) 97.1 kg (214 lb 1.1 oz) 96.5 kg (212 lb 11.9 oz)    Examination:  General exam: Appears calm and comfortable  Respiratory  system: Clear to auscultation. Respiratory effort normal. Cardiovascular system: Bradycardic.  No JVD, no murmurs, no rubs, no gallops.  Bilateral lower extremity edema.  Gastrointestinal system: Abdomen is nondistended, soft and nontender. No organomegaly or masses felt. Normal bowel sounds heard. Central nervous system: Alert and oriented. No focal neurological deficits. Extremities: Symmetric 5 x 5 power. Skin: No rashes, lesions or ulcers Psychiatry: Judgement and insight appear normal. Mood & affect appropriate.     Data Reviewed: I have personally reviewed following labs and imaging studies  CBC: Recent Labs  Lab 03/31/18 0417 04/01/18 0316 04/02/18 0331 04/03/18 0753  WBC 21.8* 14.5* 11.9* 11.2*  NEUTROABS  --   --  9.9* 8.7*  HGB 10.8* 10.6* 11.3* 12.0  HCT 32.8* 32.5* 34.8* 37.2  MCV 88.6 89.0 88.8 90.1  PLT 129* 121* 168 093   Basic Metabolic Panel: Recent Labs  Lab 03/31/18 0417  04/02/18 0331 04/03/18 0753 04/04/18 0807 04/05/18 0836 04/06/18 0503  NA 143   < > 145 148* 147* 143 140  K 3.6   < > 3.0* 2.8* 3.4* 3.2* 3.5  CL 104   < > 105 102 104 100* 98*  CO2 26   < > 27 31 30 30 29   GLUCOSE  113*   < > 175* 125* 120* 123* 110*  BUN 38*   < > 42* 40* 32* 25* 22*  CREATININE 1.75*   < > 1.58* 1.33* 1.24* 1.21* 1.13*  CALCIUM 8.0*   < > 8.7* 8.8* 8.6* 8.7* 8.9  MG 2.4  --  2.0 1.7 1.7  --   --   PHOS 2.3*  --   --   --   --   --   --    < > = values in this interval not displayed.   GFR: Estimated Creatinine Clearance: 56.3 mL/min (A) (by C-G formula based on SCr of 1.13 mg/dL (H)). Liver Function Tests: Recent Labs  Lab 04/02/18 0331  AST 15  ALT 5*  ALKPHOS 73  BILITOT 1.1  PROT 5.8*  ALBUMIN 2.5*   No results for input(s): LIPASE, AMYLASE in the last 168 hours. No results for input(s): AMMONIA in the last 168 hours. Coagulation Profile: No results for input(s): INR, PROTIME in the last 168 hours. Cardiac Enzymes: Recent Labs  Lab  04/01/18 0930 04/01/18 1518 04/01/18 2104  TROPONINI 0.18* 0.17* 0.15*   BNP (last 3 results) No results for input(s): PROBNP in the last 8760 hours. HbA1C: No results for input(s): HGBA1C in the last 72 hours. CBG: Recent Labs  Lab 04/05/18 1808 04/05/18 2246 04/06/18 0836 04/06/18 1211 04/06/18 1659  GLUCAP 92 143* 154* 122* 126*   Lipid Profile: No results for input(s): CHOL, HDL, LDLCALC, TRIG, CHOLHDL, LDLDIRECT in the last 72 hours. Thyroid Function Tests: No results for input(s): TSH, T4TOTAL, FREET4, T3FREE, THYROIDAB in the last 72 hours. Anemia Panel: No results for input(s): VITAMINB12, FOLATE, FERRITIN, TIBC, IRON, RETICCTPCT in the last 72 hours. Sepsis Labs: Recent Labs  Lab 03/31/18 0417  PROCALCITON 109.41    Recent Results (from the past 240 hour(s))  Blood Culture (routine x 2)     Status: None   Collection Time: 03/28/18  8:10 PM  Result Value Ref Range Status   Specimen Description   Final    BLOOD LEFT FOREARM Performed at [facility] [address], [place], [place] [address]    Special Requests   Final    BOTTLES DRAWN AEROBIC AND ANAEROBIC Blood Culture adequate volume Performed at [facility] [address], [place], [place] [address]    Culture   Final    NO GROWTH 5 DAYS Performed at [facility], [place] [address], [place], [place] [address]    Report Status 04/02/2018 FINAL  Final  Blood Culture (routine x 2)     Status: None   Collection Time: 03/28/18  8:20 PM  Result Value Ref Range Status   Specimen Description   Final    BLOOD RIGHT FOREARM Performed at [facility] [address], [place], [place] [address]    Special Requests   Final    BOTTLES DRAWN AEROBIC AND ANAEROBIC Blood Culture adequate volume Performed at [facility] [address] [place], [place] [address]    Culture   Final    NO GROWTH 5 DAYS Performed at [facility], [place] [address], [place], [place] [address]    Report Status 04/02/2018 FINAL  Final  Urine culture     Status: None   Collection Time: 03/28/18  8:51 PM  Result Value Ref Range Status   Specimen Description   Final    URINE, CATHETERIZED Performed at [facility],  [place] [address] [place], [place] [address]    Special Requests   Final    NONE Performed at [facility], [place] [address], [place], [place] [address]    Culture   Final    NO GROWTH Performed at [facility], [place] [address], [place], [place] [address]    Report Status 03/30/2018 FINAL  Final  Urine Culture     Status: Abnormal   Collection Time: 03/29/18 12:21 AM  Result Value Ref Range Status   Specimen Description   Final    URINE, RANDOM Performed at [facility] [address], [place], [place] [address]    Special Requests   Final    NONE Performed at [facility], [place] [address] [place], [place] [address]    Culture (A)  Final    <10,000 COLONIES/mL INSIGNIFICANT GROWTH Performed at [facility] [address], [place], [place] [address]    Report Status 03/30/2018 FINAL  Final  MRSA PCR Screening     Status: None   Collection Time: 03/29/18  1:57 AM  Result Value Ref Range Status   MRSA by PCR NEGATIVE NEGATIVE Final    Comment:        The GeneXpert MRSA Assay (FDA approved for NASAL specimens only), is one component of a comprehensive MRSA colonization surveillance program. It is not intended to diagnose MRSA infection nor to guide or monitor treatment for MRSA infections. Performed at [facility], [place] [address], [place], [place] [address]   Urine Culture     Status: None   Collection Time: 03/29/18  2:55 AM  Result Value Ref Range Status   Specimen Description   Final    URINE, RANDOM Performed at [facility] [address], [place], [place] [address]     Special Requests   Final    NONE Performed at [facility], [place] [address] [place], [place] [address]    Culture   Final    NO GROWTH Performed at [facility], [place] [address], [place], [place] [address]    Report Status 03/30/2018 FINAL  Final         Radiology Studies: No results found.      Scheduled Meds: . azelastine  1 spray Each Nare BID  . carbidopa-levodopa  2 tablet Oral TID  . enoxaparin (LOVENOX) injection  40 mg Subcutaneous Daily  . escitalopram  15 mg Oral QHS  . famotidine  20 mg Oral Daily  . fluticasone  2 spray Each Nare Daily  . furosemide  40 mg Oral Daily  . insulin aspart  0-15 Units Subcutaneous TID WC  . insulin aspart  0-5 Units Subcutaneous QHS  . mouth rinse  15 mL Mouth Rinse BID  . montelukast  10 mg Oral QHS  . potassium chloride  20 mEq Oral BID  . saccharomyces boulardii  250 mg Oral BID   Continuous Infusions:    LOS: 8 days    Time spent: 69minutes    Berle Mull, MD [facility]  If 7PM-7AM, please contact night-coverage [email or web address] Password [email or web address] 04/06/2018, 7:00 PM

## 2018-04-06 NOTE — Progress Notes (Signed)
9 Days Post-Op   Subjective/Chief Complaint:  1 - Right Ureteral / Renal Stones - w/p Rt JJ stent 5/18 for 62mm ureteral stone + scattered Rt renal steons (about 1cm total volume).   2 - Urosepsis- fevers, leukocytosis, bacteruria c/w primary urosepsis at presentation 03/28/18. Placed on Vanc + Zosyn, narrowed to Zosyn. [place], [place] 5/17 scant growth to date. Last day of ABX 5/24.   3 - Acute Renal Failure - Cr to 1.8s during admission for urosepsis / Rt stone 03/2018. Baseline Cr <1. She has been on vancomycin and pressors. Resolved with Cr 1.1's later in admission.   Today "[name] [name]" is stable. NO fevers, now mostly awaiting placement. She is off antibiotics.    Objective: Vital signs in last 24 hours: Temp:  [98.2 F (36.8 C)-98.7 F (37.1 C)] 98.3 F (36.8 C) (05/26 0457) Pulse Rate:  [55-60] 59 (05/26 0457) Resp:  [16-22] 22 (05/26 0457) BP: (125-141)/(57-73) 141/73 (05/26 0457) SpO2:  [91 %-98 %] 98 % (05/26 0457) Weight:  [96.5 kg (212 lb 11.9 oz)] 96.5 kg (212 lb 11.9 oz) (05/26 0454) Last BM Date: 04/05/18  Intake/Output from previous day: 05/25 0701 - 05/26 0700 In: 480 [P.O.:480] Out: -  Intake/Output this shift: No intake/output data recorded.   General appearance: alert, cooperative and improved mentation and alertness, stable stigmata of advanced neurologic diseas.  Eyes: negative Neck: supple, symmetrical, trachea midline and Rt sided neck central line c/d/i. No site reaction / erythema.  Back: symmetric, no curvature. ROM normal. No CVA tenderness. Resp: non-labored on [place] O2 Cardio: Nl rate by bedside monitor No foley at this point.    Lab Results:  No results for input(s): WBC, HGB, HCT, PLT in the last 72 hours. BMET Recent Labs    04/05/18 0836 04/06/18 0503  NA 143 140  K 3.2* 3.5  CL 100* 98*  CO2 30 29  GLUCOSE 123* 110*  BUN 25* 22*  CREATININE 1.21* 1.13*  CALCIUM 8.7* 8.9   PT/INR No results for input(s): LABPROT, INR in the last 72  hours. ABG No results for input(s): PHART, HCO3 in the last 72 hours.  Invalid input(s): PCO2, PO2  Studies/Results: No results found.  Anti-infectives: Anti-infectives (From admission, onward)   Start     Dose/Rate Route Frequency Ordered Stop   04/03/18 1000  cefdinir (OMNICEF) capsule 300 mg  Status:  Discontinued     300 mg Oral Every 12 hours 04/03/18 0842 04/05/18 1141   04/01/18 1400  cefTRIAXone (ROCEPHIN) 2 g in sodium chloride 0.9 % 100 mL IVPB  Status:  Discontinued     2 g 200 mL/hr over 30 Minutes Intravenous Every 24 hours 04/01/18 1035 04/03/18 0842   03/31/18 2000  vancomycin (VANCOCIN) 1,250 mg in sodium chloride 0.9 % 250 mL IVPB  Status:  Discontinued     1,250 mg 166.7 mL/hr over 90 Minutes Intravenous Every 48 hours 03/30/18 1249 03/31/18 1002   03/30/18 0800  vancomycin (VANCOCIN) IVPB 1000 mg/200 mL premix  Status:  Discontinued     1,000 mg 200 mL/hr over 60 Minutes Intravenous Every 36 hours 03/29/18 0310 03/30/18 1249   03/29/18 0200  piperacillin-tazobactam (ZOSYN) IVPB 3.375 g  Status:  Discontinued     3.375 g 12.5 mL/hr over 240 Minutes Intravenous Every 8 hours 03/29/18 0159 04/01/18 1035   03/28/18 2015  vancomycin (VANCOCIN) 2,000 mg in sodium chloride 0.9 % 500 mL IVPB     2,000 mg 250 mL/hr over 120 Minutes Intravenous  Once 03/28/18 2003 03/29/18 0250   03/28/18 2015  piperacillin-tazobactam (ZOSYN) IVPB 3.375 g     3.375 g 100 mL/hr over 30 Minutes Intravenous  Once 03/28/18 2003 03/28/18 2111      Assessment/Plan:  1 - Right Ureteral / Renal Stones - now temporized with stenting. Rec ureteroscopy in elective setting after clears infectious paramaters and functional status at baseline. No further surgical intervention this admission. We will arrange outpatient ureteroscopy in elective setting in few weeks.   2 - Urosepsis- resolved after appropriate empiric ABX course, no CX data to further guide.   3 - Acute Renal Failure - likely some  pre-renal from necessary pressor use and some nephrotoxicity from recent vancomycin. This has essentially resolved.   Will follow, please call me directly with questions any time.   [facility], Taijah Macrae 04/06/2018

## 2018-04-07 LAB — GLUCOSE, CAPILLARY
GLUCOSE-CAPILLARY: 103 mg/dL — AB (ref 65–99)
GLUCOSE-CAPILLARY: 123 mg/dL — AB (ref 65–99)
GLUCOSE-CAPILLARY: 101 mg/dL — AB (ref 65–99)
Glucose-Capillary: 148 mg/dL — ABNORMAL HIGH (ref 65–99)

## 2018-04-07 LAB — BASIC METABOLIC PANEL
ANION GAP: 13 (ref 5–15)
BUN: 20 mg/dL (ref 6–20)
CHLORIDE: 101 mmol/L (ref 101–111)
CO2: 27 mmol/L (ref 22–32)
CREATININE: 1.11 mg/dL — AB (ref 0.44–1.00)
Calcium: 8.8 mg/dL — ABNORMAL LOW (ref 8.9–10.3)
GFR calc non Af Amer: 51 mL/min — ABNORMAL LOW (ref >60)
GFR, EST AFRICAN AMERICAN: 59 mL/min — AB (ref >60)
Glucose, Bld: 116 mg/dL — ABNORMAL HIGH (ref 65–99)
Potassium: 3.4 mmol/L — ABNORMAL LOW (ref 3.5–5.1)
SODIUM: 141 mmol/L (ref 135–145)

## 2018-04-07 NOTE — Progress Notes (Signed)
PROGRESS NOTE    [name] [name]  ZOX:[id number] DOB: December 17, 1950 DOA: 03/28/2018 PCP: [name], MD   Brief Narrative:  [phone] with hx DM, OSA, HTN, Depression, OA, Multiple system atrophy, and Nephrolithiasis, presented to the ER on 5/17 after she was found unresponsive by her family. Prior to being found unresponsive, she was seen by her PCP and diagnosed with a UTI. She was sent home with a prescription for ciprofloxacin. She took 1 dose of the cipro after which family came home and found her minimally responsive. EMS was called and found patient to be hypotensive and tachycardic. She was brought to the ER where she was found to have a temperature of 104 and was found to be in septic shock due to obstructive uropathy. CT Abdomen revealed pyelonephritis and a 74mm obstructive stone in the right ureter. She was taken to the OR where Urology placed a ureteral stent. She returned to the ICU and was requiring vasopressors, so PCCM was consulted. Patient noted to have worsening hypoxia and increased work of breathing and subsequently intubated.  Patient placed empirically on IV antibiotics and pancultured.  Patient subsequently extubated 03/30/2018.  Pressors subsequently discontinued 03/31/2018.  [facility] transferred to [facility] 04/01/2018.  Assessment & Plan:   Principal Problem:   Severe sepsis with septic shock (HCC) Active Problems:   Diabetes type 2, uncontrolled (HCC)   OSA on CPAP   PSP (progressive supranuclear palsy) (HCC)   Hypokalemia   Elevated lactic acid level   Elevated troponin   Pyohydronephrosis   Septic shock (HCC)   Bradycardia   UTI (urinary tract infection)   ARF (acute renal failure) (HCC)   Right ureteral stone   Acute respiratory failure with hypoxia (HCC)   Pyelonephritis   Severe sepsis (HCC)   Acute diastolic heart failure (HCC)  1 severe sepsis with septic shock secondary to urosepsis/pyelonephritis/UTI, currently resolved Patient currently  off pressors with improvement with blood pressure. Patient pancultured with blood cultures with no growth to date. Urine cultures negative. Patient status post JJ stent placement per urology 03/29/2018. Patient was on IV vancomycin and IV Zosyn and then just on IV Zosyn. Narrow IV antibiotics to IV Rocephin.  change to oral Omnicef and monitor.  Last day antibiotic on 04/04/2018. Patient was given steroids for stress dose, now completed.  2.  Status post vent dependent respiratory failure/acute respiratory failure with hypoxia Acute on chronic diastolic CHF. Patient extubated 03/30/2018.  Clinically improved.  With sats of 97% on room air.  Follow. Patient received IV Lasix. Now appears to be euvolemic. Transition to oral Lasix.  Monitor.  3.  Right urethral stone status post JJ stent placement 03/29/2018/hydronephrosis Per urology.  Cultures with no growth to date.  Urine cultures with insignificant growth.  Foley catheter removed per urology.  4.  Acute renal failure Baseline creatinine less than 1, 2 years ago.  Likely secondary to a prerenal azotemia as patient noted to be hypotensive requiring pressors in the setting of IV vancomycin and hydronephrosis. Creatinine almost back to preadmission creatinine.  Will monitor.  5.  Hypokalemia Replete.  6.  Bradycardia/pauses/Elevated troponin Patient noted to be bradycardic with heart rates in the 30s overnight with multiple pauses noted.  Patient denies any chest pain or shortness of breath.  Patient not on any rate limiting medications. Cardiology consulted, appreciate input.  Avoid AV nodal blocking agent.  No indication for any further work-up or treatment at present  7.  Diarrhea Improved.  No evidence of  abdominal pain nausea or vomiting.  No active bleeding as well. Continue PRN Imodium.  Add probiotics.  All the antibiotics are stopped anticipating diarrhea to improve rapidly. Has some diarrhea although the frequency has improved  significantly.  Monitor overnight as the patient will remain at risk for dehydration due to persistent diarrhea.  8.  Obstructive sleep apnea CPAP nightly.  9.  Acute metabolic encephalopathy Improved.  10.  Multiple system atrophy/ Depression Continue Sinemet and Lexapro.  11.  Diabetes mellitus II well controlled, no complication V0J 6.3.  Sliding scale insulin.  DVT prophylaxis: Lovenox Code Status: Full Family Communication: updated patient.  family at bedside. Disposition Plan: Discharge to tomorrow.  Consultants:   PCCM: Dr. Jimmey Ralph 03/29/2018  Urology: Dr. Fredric Dine. Tresa Moore 03/28/2018  Cardiology Dr Burt Knack  Procedures:   CT renal stone protocol 03/28/2018  Chest x-ray 03/30/2018, 03/29/2018, 03/28/2018  Abdominal films 03/28/2018  Cystoscopy with right retrograde pyelogram and interpretation of right ureteral stent placement 03/29/2018 per Dr. Tresa Moore  Right IJ CVL 03/29/2018  ETT 03/29/2018>>>>> 03/30/2018  Antimicrobials:   IV Zosyn 03/28/2018>>>>>> 04/01/2018  IV vancomycin 03/28/2018>>>>>> 03/30/2018  IV Rocephin 04/01/2018   Subjective: Still has diarrhea.  No abdominal pain.  Minimal oral intake.  Objective: Vitals:   04/06/18 1949 04/07/18 0347 04/07/18 0500 04/07/18 1448  BP: 122/67 (!) 142/68  124/69  Pulse: 61 66  66  Resp: 16 16  14   Temp: 98.7 F (37.1 C) 98.3 F (36.8 C)  98.9 F (37.2 C)  TempSrc: Oral Oral  Oral  SpO2: 99% 99%  93%  Weight:   95 kg (209 lb 7 oz)   Height:        Intake/Output Summary (Last 24 hours) at 04/07/2018 1637 Last data filed at 04/07/2018 1301 Gross per 24 hour  Intake 360 ml  Output 1400 ml  Net -1040 ml   Filed Weights   04/05/18 0500 04/06/18 0454 04/07/18 0500  Weight: 97.1 kg (214 lb 1.1 oz) 96.5 kg (212 lb 11.9 oz) 95 kg (209 lb 7 oz)    Examination:  General exam: Appears calm and comfortable  Respiratory system: Clear to auscultation. Respiratory effort normal. Cardiovascular system: Bradycardic.   No JVD, no murmurs, no rubs, no gallops.  Bilateral lower extremity edema.  Gastrointestinal system: Abdomen is nondistended, soft and nontender. No organomegaly or masses felt. Normal bowel sounds heard. Central nervous system: Alert and oriented. No focal neurological deficits. Extremities: Symmetric 5 x 5 power. Skin: No rashes, lesions or ulcers Psychiatry: Judgement and insight appear normal. Mood & affect appropriate.     Data Reviewed: I have personally reviewed following labs and imaging studies  CBC: Recent Labs  Lab 04/01/18 0316 04/02/18 0331 04/03/18 0753  WBC 14.5* 11.9* 11.2*  NEUTROABS  --  9.9* 8.7*  HGB 10.6* 11.3* 12.0  HCT 32.5* 34.8* 37.2  MCV 89.0 88.8 90.1  PLT 121* 168 500   Basic Metabolic Panel: Recent Labs  Lab 04/02/18 0331 04/03/18 0753 04/04/18 0807 04/05/18 0836 04/06/18 0503 04/07/18 0551  NA 145 148* 147* 143 140 141  K 3.0* 2.8* 3.4* 3.2* 3.5 3.4*  CL 105 102 104 100* 98* 101  CO2 27 31 30 30 29 27   GLUCOSE 175* 125* 120* 123* 110* 116*  BUN 42* 40* 32* 25* 22* 20  CREATININE 1.58* 1.33* 1.24* 1.21* 1.13* 1.11*  CALCIUM 8.7* 8.8* 8.6* 8.7* 8.9 8.8*  MG 2.0 1.7 1.7  --   --   --    GFR:  Estimated Creatinine Clearance: 56.8 mL/min (A) (by C-G formula based on SCr of 1.11 mg/dL (H)). Liver Function Tests: Recent Labs  Lab 04/02/18 0331  AST 15  ALT 5*  ALKPHOS 73  BILITOT 1.1  PROT 5.8*  ALBUMIN 2.5*   No results for input(s): LIPASE, AMYLASE in the last 168 hours. No results for input(s): AMMONIA in the last 168 hours. Coagulation Profile: No results for input(s): INR, PROTIME in the last 168 hours. Cardiac Enzymes: Recent Labs  Lab 04/01/18 0930 04/01/18 1518 04/01/18 2104  TROPONINI 0.18* 0.17* 0.15*   BNP (last 3 results) No results for input(s): PROBNP in the last 8760 hours. HbA1C: No results for input(s): HGBA1C in the last 72 hours. CBG: Recent Labs  Lab 04/06/18 1211 04/06/18 1659 04/07/18 0755  04/07/18 1132 04/07/18 1613  GLUCAP 122* 126* 103* 123* 101*   Lipid Profile: No results for input(s): CHOL, HDL, LDLCALC, TRIG, CHOLHDL, LDLDIRECT in the last 72 hours. Thyroid Function Tests: No results for input(s): TSH, T4TOTAL, FREET4, T3FREE, THYROIDAB in the last 72 hours. Anemia Panel: No results for input(s): VITAMINB12, FOLATE, FERRITIN, TIBC, IRON, RETICCTPCT in the last 72 hours. Sepsis Labs: No results for input(s): PROCALCITON, LATICACIDVEN in the last 168 hours.  Recent Results (from the past 240 hour(s))  Blood Culture (routine x 2)     Status: None   Collection Time: 03/28/18  8:10 PM  Result Value Ref Range Status   Specimen Description   Final    BLOOD LEFT FOREARM Performed at [facility] [address] [place], [place] [address]    Special Requests   Final    BOTTLES DRAWN AEROBIC AND ANAEROBIC Blood Culture adequate volume Performed at [facility] [address] [place], [place] [address]    Culture   Final    NO GROWTH 5 DAYS Performed at [facility], [place] [address], [place], [place] [address]    Report Status 04/02/2018 FINAL  Final  Blood Culture (routine x 2)     Status: None   Collection Time: 03/28/18  8:20 PM  Result Value Ref Range Status   Specimen Description   Final    BLOOD RIGHT FOREARM Performed at [facility] [address] [place], [place] [address]    Special Requests   Final    BOTTLES DRAWN AEROBIC AND ANAEROBIC Blood Culture adequate volume Performed at [facility] [address] [place], [place] [address]    Culture   Final    NO GROWTH 5 DAYS Performed at [facility], [place] [address], [place], [place] [address]    Report Status 04/02/2018 FINAL  Final  Urine culture     Status: None   Collection Time: 03/28/18  8:51 PM  Result Value Ref Range Status   Specimen Description   Final    URINE, CATHETERIZED Performed at [facility], [place] [address], [place], [place] [address]    Special Requests   Final    NONE Performed at [facility], [place] [address] [place], [place] [address]    Culture   Final    NO GROWTH Performed at [facility], [place] [address], [place], [place] [address]    Report Status 03/30/2018 FINAL  Final  Urine Culture     Status: Abnormal   Collection Time: 03/29/18 12:21 AM  Result Value Ref Range Status   Specimen Description   Final  URINE, RANDOM Performed at [facility], [place] [address], [place], [place] [address]    Special Requests   Final    NONE Performed at [facility], [place] [address], [place], [place] [address]    Culture (A)  Final    <10,000 COLONIES/mL INSIGNIFICANT GROWTH Performed at [facility] [address], [place], [place] [address]    Report Status 03/30/2018 FINAL  Final  MRSA PCR Screening     Status: None   Collection Time: 03/29/18  1:57 AM  Result Value Ref Range Status   MRSA by PCR NEGATIVE NEGATIVE Final    Comment:        The GeneXpert MRSA Assay (FDA approved for NASAL specimens only), is one component of a comprehensive MRSA colonization surveillance program. It is not intended to diagnose MRSA infection nor to guide or monitor treatment for MRSA infections. Performed at [facility], [place] [address], [place], [place] [address]   Urine Culture     Status: None   Collection Time: 03/29/18  2:55 AM  Result Value Ref Range Status   Specimen Description   Final    URINE, RANDOM Performed at [facility] [address] [place], [place] [address]    Special Requests   Final    NONE Performed at [facility], [place] [address], [place], [place] [address]    Culture   Final    NO GROWTH Performed at [facility], [place] [address] [place], [place] [address]    Report Status 03/30/2018 FINAL   Final         Radiology Studies: No results found.      Scheduled Meds: . azelastine  1 spray Each Nare BID  . carbidopa-levodopa  2 tablet Oral TID  . enoxaparin (LOVENOX) injection  40 mg Subcutaneous Daily  . escitalopram  15 mg Oral QHS  . famotidine  20 mg Oral Daily  . fluticasone  2 spray Each Nare Daily  . furosemide  40 mg Oral Daily  . insulin aspart  0-15 Units Subcutaneous TID WC  . insulin aspart  0-5 Units Subcutaneous QHS  . mouth rinse  15 mL Mouth Rinse BID  . montelukast  10 mg Oral QHS  . potassium chloride  20 mEq Oral BID  . saccharomyces boulardii  250 mg Oral BID   Continuous Infusions:    LOS: 9 days    Time spent: 53minutes    Berle Mull, MD [facility]  If 7PM-7AM, please contact night-coverage [email or web address] Password [facility] 04/07/2018, 4:37 PM

## 2018-04-07 NOTE — Progress Notes (Signed)
Spoke with pt, pt brother, sister in law and son [name] [name] via telephone who selected [facility] in [place]. [facility]'s admission is closed at present time related to this being a [place]. Pt, son [name] [name], pt's brother and sister in law are aware that SNF is out of pocket, insurance will not cover. CSW aware/

## 2018-04-07 NOTE — Progress Notes (Signed)
Discussed disposition with pt's sister, attending, and CM. See previous notes for progress thus far- at this point family/pt interested in having pt move into a SNF for long term care rather than returning home to her independent living apartment with caregivers.  Request [facility].  CSW made referral for long term care private pay admission there and other area SNFs.  Will follow.   Sharren Bridge, MSW, LCSW Clinical Social Work 04/07/2018 [phone]

## 2018-04-08 ENCOUNTER — Telehealth: Payer: Self-pay | Admitting: Psychology

## 2018-04-08 LAB — BASIC METABOLIC PANEL
ANION GAP: 16 — AB (ref 5–15)
BUN: 18 mg/dL (ref 6–20)
CHLORIDE: 104 mmol/L (ref 101–111)
CO2: 24 mmol/L (ref 22–32)
Calcium: 9 mg/dL (ref 8.9–10.3)
Creatinine, Ser: 1.02 mg/dL — ABNORMAL HIGH (ref 0.44–1.00)
GFR calc Af Amer: >60 mL/min (ref >60)
GFR, EST NON AFRICAN AMERICAN: 56 mL/min — AB (ref >60)
GLUCOSE: 103 mg/dL — AB (ref 65–99)
POTASSIUM: 3.6 mmol/L (ref 3.5–5.1)
Sodium: 144 mmol/L (ref 135–145)

## 2018-04-08 LAB — GLUCOSE, CAPILLARY
GLUCOSE-CAPILLARY: 98 mg/dL (ref 65–99)
GLUCOSE-CAPILLARY: 176 mg/dL — AB (ref 65–99)
GLUCOSE-CAPILLARY: 89 mg/dL (ref 65–99)

## 2018-04-08 MED ORDER — LOPERAMIDE HCL 2 MG PO CAPS: 2.0000 mg | ORAL_CAPSULE | ORAL | 0 refills | Status: DC | PRN

## 2018-04-08 MED ORDER — SACCHAROMYCES BOULARDII 250 MG PO CAPS: 250.0000 mg | ORAL_CAPSULE | Freq: Two times a day (BID) | ORAL | 0 refills | Status: AC

## 2018-04-08 MED ORDER — FUROSEMIDE 20 MG PO TABS: 20.0000 mg | ORAL_TABLET | Freq: Every day | ORAL | 0 refills | Status: DC

## 2018-04-08 MED ORDER — POTASSIUM CHLORIDE CRYS ER 20 MEQ PO TBCR: 20.0000 meq | EXTENDED_RELEASE_TABLET | Freq: Two times a day (BID) | ORAL | 0 refills | Status: DC

## 2018-04-08 MED ORDER — FUROSEMIDE 40 MG PO TABS: 40.0000 mg | ORAL_TABLET | Freq: Every day | ORAL | 0 refills | Status: DC

## 2018-04-08 NOTE — Telephone Encounter (Signed)
Patient's son [name] contact me on Friday leaving a message stating that his mom is in the hospital and asked me to return his phone call.  I reviewed the chart to see that it looks like the patient is going to discharge to [facility] when she is ready for discharge.  I contacted the patient's son back and he confirmed that plan.  We talked about the patient being in a position of needing either assisted living or more skilled care going forward and this facility have been discussed with the patient before.  The patient's brother and sister-in-law were going to be looking into it for the patient as it was identified that independent living was no longer appropriate/compatible with her needs with her disease.  I shared with the patient's son that this is something that we have been working with the patient on.  The patient's son stated that his call was more for reassurance to make sure they were making the right decision.

## 2018-04-08 NOTE — Clinical Social Work Placement (Addendum)
Pt discharged and will admit to [facility]- report [phone]. Room # 404-B  Plan to admit private pay as new long term care resident (pt has been living in independent living with caretakers- both she and family requesting long term snf placement instead of returning home). Had requested [facility] however facility unable to make bed offer as, since they do not accept her insurance, if billable need was to arise, pt would have to pay out of pocket for that as well as room/board. Pt was able to choose [facility] from bed offers made and CSW confirmed with facility that bed available for pt today.  Will provide DC information via the [facility] and arrange PTAR. Family at bedside assisted pt with decisions and are agreeable.  CLINICAL SOCIAL WORK PLACEMENT  NOTE  Date:  04/08/2018  Patient Details  Name: [name] [name] MRN: [id number] Date of Birth: [birth year]  Clinical Social Work is seeking post-discharge placement for this patient at the [place] level of care (*CSW will initial, date and re-position this form in  chart as items are completed):  Yes   Patient/family provided with [facility] Work Department's list of facilities offering this level of care within the geographic area requested by the patient (or if unable, by the patient's family).  Yes   Patient/family informed of their freedom to choose among providers that offer the needed level of care, that participate in Medicare, Medicaid or managed care program needed by the patient, have an available bed and are willing to accept the patient.  Yes   Patient/family informed of [facility] ownership interest in [facility] and [facility], as well as of the fact that they are under no obligation to receive care at these facilities.  PASRR submitted to EDS on 04/04/18     PASRR number received on 04/04/18     Existing PASRR number confirmed on       FL2  transmitted to all facilities in geographic area requested by pt/family on 04/04/18     FL2 transmitted to all facilities within larger geographic area on       Patient informed that his/her managed care company has contracts with or will negotiate with certain facilities, including the following:        Yes   Patient/family informed of bed offers received.  Patient chooses bed at [facility], [facility]     Physician recommends and patient chooses bed at [facility], [place]    Patient to be transferred to [facility], [facility] on 04/08/18.  Patient to be transferred to facility by PTAR     Patient family notified on 04/08/18 of transfer.  Name of family member notified:  sister [facility]     PHYSICIAN       Additional Comment:    _______________________________________________ Nila Nephew, LCSW 04/08/2018, 12:19 PM  [phone]

## 2018-04-08 NOTE — Discharge Summary (Signed)
[facility] Discharge Summary   Patient: [name] [name] ZOX:[id number]   PCP: [name], MD DOB: 20-May-1951   Date of admission: 03/28/2018   Date of discharge:  04/08/2018    Discharge Diagnoses:  Principal Problem:   Severe sepsis with septic shock ([place]) Active Problems:   Diabetes type 2, uncontrolled ([place])   OSA on CPAP   PSP (progressive supranuclear palsy) (HCC)   Hypokalemia   Elevated lactic acid level   Elevated troponin   Pyohydronephrosis   Septic shock (HCC)   Bradycardia   UTI (urinary tract infection)   ARF (acute renal failure) (HCC)   Right ureteral stone   Acute respiratory failure with hypoxia ([place])   Pyelonephritis   Severe sepsis ([place])   Acute diastolic heart failure ([place])   Admitted From: home Disposition:  SNF  Recommendations for Outpatient Follow-up:  1. Please follow-up with PCP in 1 to 2 weeks.  Follow-up with urology as recommended.  Contact information for after-discharge care    Destination    [facility] .   Service:  Skilled Nursing Contact information: [place] [phone]             Diet recommendation: Cardiac diet  Activity: The patient is advised to gradually reintroduce usual activities.  Discharge Condition: good  Code Status: Partial, does not want chest compression but is okay with intubation  History of present illness: As per the H and P dictated on admission, "[name] [name] is a 67 y.o. female with medical history significant of HPI: cough followed by pulmonology, history of sleep apnea followed by neurology when depression, history of kidney stones, HTN, HLD, DM 2, progressive supranuclear palsy  Presented with episode of unresponsiveness found by family patient was seen by primary care provider today KUB was negative for stones she was diagnosed with UTI /pyelonephritis and was recommended to come to emergency department  she was started on Cipro instead patient went home.  After taking 1 dose of Cipro when she came home family found her to be  minimally responsive EMS was called initial vitals blood pressure 109/64 heart rate 154.  She was tachypneic and diaphoretic sort of emesis"  Hospital Course:  Summary of her active problems in the hospital is as following. 1 Severe sepsis with septic shock secondary to urosepsis/pyelonephritis/UTI, currently resolved Patient currently off pressors with improvement with blood pressure. Patient pancultured with blood cultures with no growth to date. Urine cultures negative. Patient status post JJ stent placement per urology 03/29/2018. Patient was on IV vancomycin and IV Zosyn and then just on IV Zosyn. Narrow IV antibiotics to IV Rocephin.  change to oral Omnicef and monitor.  Last day antibiotic on 04/04/2018. Patient was given steroids for stress dose, now completed.  2.  Status post vent dependent respiratory failure/acute respiratory failure with hypoxia Acute on chronic diastolic CHF. Patient extubated 03/30/2018.  Clinically improved.  With sats of 97% on room air.  Follow. Patient received IV Lasix. Now appears to be euvolemic. Transition to oral Lasix.  Monitor.  3.  Right urethral stone status post JJ stent placement 03/29/2018/hydronephrosis Per urology.  Cultures with no growth to date.  Urine cultures with insignificant growth.  Foley catheter removed per urology.  4.  Acute renal failure Baseline creatinine less than 1, 2 years ago.  Likely secondary to a prerenal azotemia as patient noted to be hypotensive requiring pressors in the setting of IV vancomycin and hydronephrosis.  Creatinine almost back to preadmission creatinine.   5.  Hypokalemia Replete.  6.  Bradycardia/pauses/Elevated troponin Patient noted to be bradycardic with heart rates in the 30s overnight with multiple pauses noted.  Patient denies any chest pain or shortness of breath.   Patient not on any rate limiting medications. Cardiology consulted, appreciate input.  Avoid AV nodal blocking agent.  No indication for any further work-up or treatment at present  7.  Diarrhea Resolved. No evidence of abdominal pain nausea or vomiting.  No active bleeding as well. Continue PRN Imodium.  Add probiotics. All the antibiotics are stopped, anticipating diarrhea to improve rapidly.  8.  Obstructive sleep apnea CPAP nightly.  9.  Acute metabolic encephalopathy Improved.  10.  Multiple system atrophy/ Depression Continue Sinemet and Lexapro.  11.  Diabetes mellitus II well controlled, no complication N2T 6.3.  Sliding scale insulin.  All other chronic medical condition were stable during the hospitalization.  Patient was seen by physical therapy, who recommended SNF, which was arranged by Education officer, museum and case Freight forwarder. On the day of the discharge the patient's vitals were stable , and no other acute medical condition were reported by patient. the patient was felt safe to be discharge at SNF with therapy.  Consultants: Cardiology, urology, critical care Procedures:  Cystoscopy with right retrograde pyelogram and interpretation of right ureteral stent placement. Central line placement Intubation  DISCHARGE MEDICATION: Allergies as of 04/08/2018      Reactions   Aleve [naproxen] Hives, Shortness Of Breath, Rash   Pt Avoids All Nsaids   Zocor [simvastatin] Other (See Comments)   "weird feeling all over body"      Medication List    STOP taking these medications   olmesartan 40 MG tablet Commonly known as:  BENICAR     TAKE these medications   aspirin 81 MG tablet Take 81 mg by mouth at bedtime.   azelastine 0.1 % nasal spray Commonly known as:  ASTELIN Place 1 spray into both nostrils 2 (two) times daily.   BIOFREEZE 4 % Gel Generic drug:  Menthol (Topical Analgesic) Apply topically.   carbidopa-levodopa 25-100 MG tablet Commonly known as:   SINEMET IR 2 tablets TID What changed:    how much to take  how to take this  when to take this  additional instructions   escitalopram 10 MG tablet Commonly known as:  LEXAPRO Take 15 mg at bedtime by mouth.   fenofibrate 160 MG tablet Take 160 mg by mouth every morning.   fluticasone 50 MCG/ACT nasal spray Commonly known as:  FLONASE Place 2 sprays into both nostrils daily.   furosemide 40 MG tablet Commonly known as:  LASIX Take 1 tablet (40 mg total) by mouth daily for 5 days. Start taking on:  04/09/2018   furosemide 20 MG tablet Commonly known as:  LASIX Take 1 tablet (20 mg total) by mouth daily. Start taking on:  04/14/2018   loperamide 2 MG capsule Commonly known as:  IMODIUM Take 1 capsule (2 mg total) by mouth as needed for diarrhea or loose stools.   loratadine 10 MG tablet Commonly known as:  CLARITIN Take 10 mg by mouth. Taking every other day.   Melatonin 5 MG Caps Take 1 capsule by mouth at bedtime as needed. For sleep   metFORMIN 500 MG tablet Commonly known as:  GLUCOPHAGE Take 250 mg by mouth 2 (two) times daily with a meal.   montelukast 10 MG tablet Commonly known as:  SINGULAIR Take  10 mg by mouth at bedtime.   nystatin cream Commonly known as:  MYCOSTATIN Apply 1 application topically 2 (two) times daily as needed for dry skin.   potassium chloride SA 20 MEQ tablet Commonly known as:  K-DUR,KLOR-CON Take 1 tablet (20 mEq total) by mouth 2 (two) times daily at 10 AM and 5 PM for 6 days.   ranitidine 150 MG tablet Commonly known as:  ZANTAC Take 150 mg by mouth 2 (two) times daily.   saccharomyces boulardii 250 MG capsule Commonly known as:  FLORASTOR Take 1 capsule (250 mg total) by mouth 2 (two) times daily for 6 days.   SYSTANE BALANCE OP Place 2 drops into both eyes 2 (two) times daily as needed (dry eyes).   vitamin B-12 500 MCG tablet Commonly known as:  CYANOCOBALAMIN Take 500 mcg by mouth daily.   VITAMIN D PO Take  5,000 Units by mouth daily.      Allergies  Allergen Reactions  . Aleve [Naproxen] Hives, Shortness Of Breath and Rash    Pt Avoids All Nsaids  . Zocor [Simvastatin] Other (See Comments)    "weird feeling all over body"   Discharge Instructions    Diet - low sodium heart healthy   Complete by:  As directed    Discharge instructions   Complete by:  As directed    It is important that you read following instructions as well as go over your medication list with RN to help you understand your care after this hospitalization.  Discharge Instructions: Please follow-up with PCP in one week  Please request your primary care physician to go over all Hospital Tests and Procedure/Radiological results at the follow up,  Please get all Hospital records sent to your PCP by signing hospital release before you go home.   Do not take more than prescribed Pain, Sleep and Anxiety Medications. You were cared for by a hospitalist during your hospital stay. If you have any questions about your discharge medications or the care you received while you were in the hospital after you are discharged, you can call the unit and ask to speak with the hospitalist on call if the hospitalist that took care of you is not available.  Once you are discharged, your primary care physician will handle any further medical issues. Please note that NO REFILLS for any discharge medications will be authorized once you are discharged, as it is imperative that you return to your primary care physician (or establish a relationship with a primary care physician if you do not have one) for your aftercare needs so that they can reassess your need for medications and monitor your lab values. You Must read complete instructions/literature along with all the possible adverse reactions/side effects for all the Medicines you take and that have been prescribed to you. Take any new Medicines after you have completely understood and accept all  the possible adverse reactions/side effects.   Increase activity slowly   Complete by:  As directed      Discharge Exam: Filed Weights   04/06/18 0454 04/07/18 0500 04/08/18 0500  Weight: 96.5 kg (212 lb 11.9 oz) 95 kg (209 lb 7 oz) 93.1 kg (205 lb 4 oz)   Vitals:   04/08/18 0558 04/08/18 1408  BP: 140/62 (!) 119/58  Pulse: (!) 56 73  Resp: 18 17  Temp: 99.1 F (37.3 C) 98.2 F (36.8 C)  SpO2: 95% 98%   General: Appear in no distress, no Rash; Oral Mucosa moist.  Cardiovascular: S1 and S2 Present, no Murmur, no JVD Respiratory: Bilateral Air entry present and Clear to Auscultation, no Crackles, no wheezes Abdomen: Bowel Sound present, Soft and no tenderness Extremities: no Pedal edema, no calf tenderness Neurology: Grossly no focal neuro deficit.  The results of significant diagnostics from this hospitalization (including imaging, microbiology, ancillary and laboratory) are listed below for reference.    Significant Diagnostic Studies: Dg Chest 1 View  Result Date: 03/29/2018 CLINICAL DATA:  Endotracheal tube and central line placement. EXAM: CHEST  1 VIEW COMPARISON:  Chest radiograph performed 03/28/2018 FINDINGS: The patient's endotracheal tube is seen ending 1-2 cm above the carina. An enteric tube is noted extending below the diaphragm, with the side port at the distal esophagus. This could be advanced approximately 5 cm. A right IJ line is noted ending about the distal SVC. The lungs are hypoexpanded. Increased interstitial markings may reflect mild interstitial edema. No definite pleural effusion or pneumothorax is seen. The cardiomediastinal silhouette is mildly enlarged. No acute osseous abnormalities are seen. The patient's right glenohumeral joint arthroplasty is partially characterized and appears grossly unremarkable. IMPRESSION: 1. Endotracheal tube seen ending 1 - 2 cm above the carina. 2. Enteric tube noted extending below the diaphragm, with the side port at the distal  esophagus. This could be advanced approximately 5 cm, as deemed clinically appropriate. 3. Lungs hypoexpanded. Increased interstitial markings may reflect mild interstitial edema. 4. No pneumothorax. Electronically Signed   By: Garald Balding M.D.   On: 03/29/2018 05:22   Dg Abd 1 View  Result Date: 03/28/2018 CLINICAL DATA:  Right flank pain EXAM: ABDOMEN - 1 VIEW COMPARISON:  04/15/2017 FINDINGS: Negative for urinary tract calculi. Normal bowel gas pattern. Lumbar levoscoliosis with degenerative change. IMPRESSION: Negative for renal calculi. Electronically Signed   By: Franchot Gallo M.D.   On: 03/28/2018 16:33   Portable Chest Xray  Result Date: 03/30/2018 CLINICAL DATA:  Acute respiratory failure with hypoxia. EXAM: PORTABLE CHEST 1 VIEW COMPARISON:  03/29/2018 FINDINGS: Endotracheal tube with tip 2.6 cm above the carina. Nasogastric tube courses into the region of the stomach and off the inferior portion of the film as tip is not visualized. Right IJ central venous catheter has tip over the SVC. Lungs are hypoinflated with subtle left basilar/retrocardiac opacification which may be due to atelectasis or infection. Cardiomediastinal silhouette and remainder of the exam is unchanged. IMPRESSION: Minimal left base opacification which may be due to atelectasis or infection. Tubes and lines as described. Electronically Signed   By: Marin Olp M.D.   On: 03/30/2018 07:11   Dg Chest Port 1 View  Result Date: 03/28/2018 CLINICAL DATA:  Sepsis, fever, short of breath EXAM: PORTABLE CHEST 1 VIEW COMPARISON:  Chest x-ray dated 12/19/2017. FINDINGS: New interstitial and/or micronodular opacities at the LEFT lung base, suspicious for edema or pneumonia. No pleural effusion or pneumothorax seen. Heart size and mediastinal contours are stable. No acute or suspicious osseous finding. IMPRESSION: New subtle interstitial and/or micronodular opacities at the LEFT lung base, suspicious for interstitial edema and/or  pneumonia. Electronically Signed   By: Franki Cabot M.D.   On: 03/28/2018 20:19   Dg C-arm 1-60 Min-no Report  Result Date: 03/29/2018 Fluoroscopy was utilized by the requesting physician.  No radiographic interpretation.   Ct Renal Stone Study  Result Date: 03/28/2018 CLINICAL DATA:  Patient found unresponsive. Elevated respiratory rate, heart rate and fever. Decreased level of consciousness. EXAM: CT ABDOMEN AND PELVIS WITHOUT CONTRAST TECHNIQUE: Multidetector CT imaging of  the abdomen and pelvis was performed following the standard protocol without IV contrast. COMPARISON:  Abdominal radiograph performed earlier today at 12:16 p.m., and CT of the abdomen and pelvis performed 04/28/2015 FINDINGS: Lower chest: Minimal bibasilar atelectasis is noted. The visualized portions of the mediastinum are unremarkable. Hepatobiliary: The liver is unremarkable in appearance. The gallbladder is unremarkable in appearance. The common bile duct remains normal in caliber. Pancreas: The pancreas is within normal limits. Spleen: The spleen is unremarkable in appearance. Adrenals/Urinary Tract: The adrenal glands are unremarkable in appearance. Scattered stones noted at the right renal pelvis and renal calyces, measuring up to 6 mm in size. Associated scattered air is noted at the right renal pelvis and right renal calyces. Would correlate clinically for evidence of emphysematous pyelonephritis. There appears to be an obstructing 5 mm stone at the distal right ureter, just above the right vesicoureteral junction, with mild right-sided hydronephrosis. Diffuse right-sided perinephric stranding and fluid are seen. Bilateral renal cysts are noted. A 2.4 cm isodense lesion is noted arising at the lower pole of the left kidney, increased in size from 2016. Malignancy cannot be excluded. Stomach/Bowel: The stomach is unremarkable in appearance. The small bowel is within normal limits. The appendix is normal in caliber, without  evidence of appendicitis. Scattered diverticulosis is noted along the descending colon, without evidence of diverticulitis. Vascular/Lymphatic: Minimal calcification is seen along the abdominal aorta. No retroperitoneal or pelvic sidewall lymphadenopathy is seen. Reproductive: The bladder is decompressed, with a Foley catheter in place. Diffuse bladder wall thickening may reflect chronic inflammation. The uterus is grossly unremarkable in appearance. No suspicious adnexal masses are seen. Other: No additional soft tissue abnormalities are seen. Musculoskeletal: No acute osseous abnormalities are identified. Mild multilevel vacuum phenomenon is noted along the lumbar spine. The visualized musculature is unremarkable in appearance. IMPRESSION: 1. Scattered stones at the right renal pelvis and renal calyces, measuring up to 6 mm in size. Associated scattered air noted at the right renal pelvis and right renal calyces. Diffuse right-sided perinephric stranding and fluid. Would correlate clinically for evidence of emphysematous pyelonephritis. 2. Obstructing 5 mm stone at the distal right ureter, just above the right vesicoureteral junction, with mild right-sided hydronephrosis. 3. 2.4 cm isodense lesion arising at the lower pole of the left kidney, increased in size from 2016. The malignancy cannot be excluded. Contrast-enhanced MRI or CT would be helpful for further evaluation, when and as deemed clinically appropriate. 4. Diffuse bladder wall thickening may reflect chronic inflammation. 5. Scattered diverticulosis along the descending colon, without evidence of diverticulitis. Electronically Signed   By: Garald Balding M.D.   On: 03/28/2018 22:07    Microbiology: No results found for this or any previous visit (from the past 240 hour(s)).   Labs: CBC: Recent Labs  Lab 04/02/18 0331 04/03/18 0753  WBC 11.9* 11.2*  NEUTROABS 9.9* 8.7*  HGB 11.3* 12.0  HCT 34.8* 37.2  MCV 88.8 90.1  PLT 168 096   Basic  Metabolic Panel: Recent Labs  Lab 04/02/18 0331 04/03/18 0753 04/04/18 0807 04/05/18 0836 04/06/18 0503 04/07/18 0551 04/08/18 0439  NA 145 148* 147* 143 140 141 144  K 3.0* 2.8* 3.4* 3.2* 3.5 3.4* 3.6  CL 105 102 104 100* 98* 101 104  CO2 27 31 30 30 29 27 24   GLUCOSE 175* 125* 120* 123* 110* 116* 103*  BUN 42* 40* 32* 25* 22* 20 18  CREATININE 1.58* 1.33* 1.24* 1.21* 1.13* 1.11* 1.02*  CALCIUM 8.7* 8.8* 8.6* 8.7* 8.9  8.8* 9.0  MG 2.0 1.7 1.7  --   --   --   --    Liver Function Tests: Recent Labs  Lab 04/02/18 0331  AST 15  ALT 5*  ALKPHOS 73  BILITOT 1.1  PROT 5.8*  ALBUMIN 2.5*   No results for input(s): LIPASE, AMYLASE in the last 168 hours. No results for input(s): AMMONIA in the last 168 hours. Cardiac Enzymes: Recent Labs  Lab 04/01/18 1518 04/01/18 2104  TROPONINI 0.17* 0.15*   BNP (last 3 results) No results for input(s): BNP in the last 8760 hours. CBG: Recent Labs  Lab 04/07/18 1132 04/07/18 1613 04/07/18 2245 04/08/18 0737 04/08/18 1211  GLUCAP 123* 101* 148* 98 176*   Time spent: 35 minutes  Signed:  Berle Mull  [facility]  04/08/2018  , 2:26 PM

## 2018-04-08 NOTE — Progress Notes (Signed)
Physical Therapy Treatment Patient Details Name: [name] [name] MRN: [id number] DOB: 23-Aug-1951 Today's Date: 04/08/2018    History of Present Illness 67 year old female admitted with severe sepsis with septic shock; family found her unresponsive.  Has had uti.  PMH:  DM, progressive supranuclear palsy, polyneuropathy, multiple system atrophy, HTN    PT Comments    Pt tolerated increased activity level today. +2 mod assist to stand and take a few pivotal steps with RW to recliner. Good progress with mobility.   Follow Up Recommendations  SNF     Equipment Recommendations  None recommended by PT    Recommendations for Other Services       Precautions / Restrictions Precautions Precautions: Fall Restrictions Weight Bearing Restrictions: No    Mobility  Bed Mobility     Rolling: Min assist(to R side)   Supine to sit: Mod assist;+2 for physical assistance     General bed mobility comments: assist for legs and trunk to sit up  Transfers Overall transfer level: Needs assistance Equipment used: Rolling walker (2 wheeled) Transfers: Sit to/from [facility] Sit to Stand: Min assist;Mod assist;+2 physical assistance;+2 safety/equipment Stand pivot transfers: Min assist;Mod assist;+2 physical assistance;+2 safety/equipment       General transfer comment: from a high bed, min/Mod A to stand and complete pivot. Assist to move RW and cues to turn completely  Ambulation/Gait                 Stairs             Wheelchair Mobility    Modified Rankin (Stroke Patients Only)       Balance   Sitting-balance support: Feet supported;Bilateral upper extremity supported   Sitting balance - Comments: support x 4 extremities with min guard for safety. Pt tended to lean backwards at times, to counter her feeling of falling forward.  Cues and min A to correct                                    Cognition Arousal/Alertness:  Awake/alert Behavior During Therapy: WFL for tasks assessed/performed                                   General Comments: pt first stated she scooted over to w/c then said she used a RW      Exercises      General Comments        Pertinent Vitals/Pain Pain Assessment: No/denies pain    Home Living                      Prior Function            PT Goals (current goals can now be found in the care plan section) Acute Rehab PT Goals Patient Stated Goal: to get stronger PT Goal Formulation: With patient Time For Goal Achievement: 04/16/18 Potential to Achieve Goals: Fair Progress towards PT goals: Progressing toward goals    Frequency    Min 2X/week      PT Plan Current plan remains appropriate    Co-evaluation PT/OT/SLP Co-Evaluation/Treatment: Yes Reason for Co-Treatment: Complexity of the patient's impairments (multi-system involvement);For patient/therapist safety PT goals addressed during session: Mobility/safety with mobility OT goals addressed during session: Strengthening/ROM      AM-PAC PT "6 Clicks" Daily Activity  Outcome Measure  Difficulty turning over in bed (including adjusting bedclothes, sheets and blankets)?: Unable Difficulty moving from lying on back to sitting on the side of the bed? : Unable Difficulty sitting down on and standing up from a chair with arms (e.g., wheelchair, bedside commode, etc,.)?: Unable Help needed moving to and from a bed to chair (including a wheelchair)?: A Lot Help needed walking in hospital room?: Total Help needed climbing 3-5 steps with a railing? : Total 6 Click Score: 7    End of Session Equipment Utilized During Treatment: Gait belt Activity Tolerance: Patient limited by fatigue;Patient tolerated treatment well Patient left: with call bell/phone within reach;in chair;with family/visitor present Nurse Communication: Mobility status PT Visit Diagnosis: Muscle weakness (generalized)  (M62.81);Difficulty in walking, not elsewhere classified (R26.2)     Time: 3614-4315 PT Time Calculation (min) (ACUTE ONLY): 21 min  Charges:  $Therapeutic Activity: 8-22 mins                    G Codes:       {   Philomena Doheny 04/08/2018, 11:05 AM [phone]

## 2018-04-08 NOTE — Consult Note (Signed)
   [facility] CM Inpatient Consult   04/08/2018  KEERAT DENICOLA Oct 21, 1951 [id number]    [facility] Care Management follow up.   Confirmed with inpatient LCSW that Mrs. Mcparland will discharge to SNF for long term placement.   No identifiable [facility] Care Management needs at this time.   Notification was sent to [facility] LCSW to inactivate status as not active for [facility] Care Management.    Marthenia Rolling, MSN-Ed, RN,BSN [facility] Liaison [phone]

## 2018-04-08 NOTE — Progress Notes (Signed)
Occupational Therapy Treatment Patient Details Name: [name] [name] MRN: [id number] DOB: 08/06/1951 Today's Date: 04/08/2018    History of present illness 67 year old female admitted with severe sepsis with septic shock; family found her unresponsive.  Has had uti.  PMH:  DM, progressive supranuclear palsy, polyneuropathy, multiple system atrophy, HTN   OT comments  Pt much improved with sitting balance/activity tolerance today and able to perform SPT with RW to chair.  Lift pad placed under her for nursing to use maximove on the way back to bed  Follow Up Recommendations  SNF    Equipment Recommendations  3 in 1 bedside commode    Recommendations for Other Services      Precautions / Restrictions Precautions Precautions: Fall Restrictions Weight Bearing Restrictions: No       Mobility Bed Mobility     Rolling: Min assist(to R side)   Supine to sit: Mod assist;+2 for physical assistance     General bed mobility comments: assist for legs and trunk to sit up  Transfers Overall transfer level: Needs assistance Equipment used: Rolling walker (2 wheeled) Transfers: Sit to/from [facility] Sit to Stand: Min assist;Mod assist;+2 physical assistance;+2 safety/equipment Stand pivot transfers: Min assist;Mod assist;+2 physical assistance;+2 safety/equipment       General transfer comment: from a high bed, min/Mod A to stand and complete pivot. Assist to move RW and cues to turn completely    Balance   Sitting-balance support: Feet supported;Bilateral upper extremity supported   Sitting balance - Comments: support x 4 extremities with min guard for safety. Pt tended to lean backwards at times, to counter her feeling of falling forward.  Cues and min A to correct                                   ADL either performed or assessed with clinical judgement   ADL                                               Vision       Perception     Praxis      Cognition Arousal/Alertness: Awake/alert Behavior During Therapy: [facility] for tasks assessed/performed                                   General Comments: pt first stated she scooted over to w/c then said she used a RW        Exercises  worked on neck ROM when sitting EOB; pt c/o tightness   Shoulder Instructions       General Comments      Pertinent Vitals/ Pain       Pain Assessment: No/denies pain  Home Living                                          Prior Functioning/Environment              Frequency  Min 2X/week        Progress Toward Goals  OT Goals(current goals can now be found in the care plan section)  Progress towards OT  goals: Progressing toward goals     Plan      Co-evaluation    PT/OT/SLP Co-Evaluation/Treatment: Yes Reason for Co-Treatment: Complexity of the patient's impairments (multi-system involvement) PT goals addressed during session: Mobility/safety with mobility OT goals addressed during session: Strengthening/ROM      AM-PAC PT "6 Clicks" Daily Activity     Outcome Measure   Help from another person eating meals?: A Lot Help from another person taking care of personal grooming?: A Lot Help from another person toileting, which includes using toliet, bedpan, or urinal?: A Lot Help from another person bathing (including washing, rinsing, drying)?: A Lot Help from another person to put on and taking off regular upper body clothing?: A Lot Help from another person to put on and taking off regular lower body clothing?: Total 6 Click Score: 11    End of Session    OT Visit Diagnosis: Muscle weakness (generalized) (M62.81)   Activity Tolerance Patient tolerated treatment well   Patient Left in chair;with call bell/phone within reach;with family/visitor present   Nurse Communication Mobility status;Need for lift equipment        Time: 1021-1044 OT Time  Calculation (min): 23 min  Charges: OT General Charges $OT Visit: 1 Visit OT Treatments $Therapeutic Activity: 8-22 mins  [name] [name], OTR/L [phone] 04/08/2018   [name] [name] 04/08/2018, 10:58 AM

## 2018-04-09 ENCOUNTER — Other Ambulatory Visit: Payer: Self-pay | Admitting: Urology

## 2018-04-09 DIAGNOSIS — A419 Sepsis, unspecified organism: Secondary | ICD-10-CM | POA: Diagnosis not present

## 2018-04-09 DIAGNOSIS — R2681 Unsteadiness on feet: Secondary | ICD-10-CM | POA: Diagnosis not present

## 2018-04-09 DIAGNOSIS — M542 Cervicalgia: Secondary | ICD-10-CM | POA: Diagnosis not present

## 2018-04-09 DIAGNOSIS — M6281 Muscle weakness (generalized): Secondary | ICD-10-CM | POA: Diagnosis not present

## 2018-04-09 DIAGNOSIS — N200 Calculus of kidney: Secondary | ICD-10-CM | POA: Diagnosis not present

## 2018-04-09 DIAGNOSIS — J9601 Acute respiratory failure with hypoxia: Secondary | ICD-10-CM | POA: Diagnosis not present

## 2018-04-09 DIAGNOSIS — R278 Other lack of coordination: Secondary | ICD-10-CM | POA: Diagnosis not present

## 2018-04-12 DIAGNOSIS — E119 Type 2 diabetes mellitus without complications: Secondary | ICD-10-CM | POA: Diagnosis not present

## 2018-04-12 DIAGNOSIS — N200 Calculus of kidney: Secondary | ICD-10-CM | POA: Diagnosis not present

## 2018-04-12 DIAGNOSIS — M6281 Muscle weakness (generalized): Secondary | ICD-10-CM | POA: Diagnosis not present

## 2018-04-12 DIAGNOSIS — J9601 Acute respiratory failure with hypoxia: Secondary | ICD-10-CM | POA: Diagnosis not present

## 2018-04-12 DIAGNOSIS — A419 Sepsis, unspecified organism: Secondary | ICD-10-CM | POA: Diagnosis not present

## 2018-04-12 DIAGNOSIS — R278 Other lack of coordination: Secondary | ICD-10-CM | POA: Diagnosis not present

## 2018-04-12 DIAGNOSIS — G4733 Obstructive sleep apnea (adult) (pediatric): Secondary | ICD-10-CM | POA: Diagnosis not present

## 2018-04-12 DIAGNOSIS — N1330 Unspecified hydronephrosis: Secondary | ICD-10-CM | POA: Diagnosis not present

## 2018-04-12 DIAGNOSIS — N179 Acute kidney failure, unspecified: Secondary | ICD-10-CM | POA: Diagnosis not present

## 2018-04-12 DIAGNOSIS — R2681 Unsteadiness on feet: Secondary | ICD-10-CM | POA: Diagnosis not present

## 2018-04-12 DIAGNOSIS — M542 Cervicalgia: Secondary | ICD-10-CM | POA: Diagnosis not present

## 2018-04-12 DIAGNOSIS — I5030 Unspecified diastolic (congestive) heart failure: Secondary | ICD-10-CM | POA: Diagnosis not present

## 2018-04-14 DIAGNOSIS — Z79899 Other long term (current) drug therapy: Secondary | ICD-10-CM | POA: Diagnosis not present

## 2018-04-14 DIAGNOSIS — D649 Anemia, unspecified: Secondary | ICD-10-CM | POA: Diagnosis not present

## 2018-04-14 DIAGNOSIS — E039 Hypothyroidism, unspecified: Secondary | ICD-10-CM | POA: Diagnosis not present

## 2018-04-15 ENCOUNTER — Other Ambulatory Visit: Payer: Self-pay

## 2018-04-15 NOTE — Patient Outreach (Signed)
[facility]) Care Management  04/15/2018  [name] [name] [name] [name] [birth year] [id number]   EMMI- General Discharge RED ON EMMI ALERT Day # 4 Date: 04/14/18 Red Alert Reason:  Got discharge papers? I don't know Scheduled follow up? No Lost interest in things? Yes Sad/hopeless/anxious/empty? yes  Patient was discharged to a skilled facility upon discharge. No outreach to patient warranted. Telephone call to [facility] in [place] to confirm patient status.  Patient remains inpatient at [facility].   Plan: RN CM will close case at this time.   Jone Baseman, RN, MSN [facility] Care Management Care Management Coordinator Direct Line [phone] Toll Free: [phone]  Fax: [phone]

## 2018-04-16 DIAGNOSIS — N201 Calculus of ureter: Secondary | ICD-10-CM | POA: Diagnosis not present

## 2018-04-16 DIAGNOSIS — E119 Type 2 diabetes mellitus without complications: Secondary | ICD-10-CM | POA: Diagnosis not present

## 2018-04-16 DIAGNOSIS — Z79899 Other long term (current) drug therapy: Secondary | ICD-10-CM | POA: Diagnosis not present

## 2018-04-21 ENCOUNTER — Ambulatory Visit: Payer: PPO | Admitting: Physical Medicine & Rehabilitation

## 2018-04-23 ENCOUNTER — Encounter (HOSPITAL_COMMUNITY): Payer: Self-pay | Admitting: *Deleted

## 2018-04-23 NOTE — Progress Notes (Signed)
On 04/21/2018 rerquested current MAR, most recent ekg, cxr and labs along with demographic face sheet., and medical history from [facility].  On 04/22/2018- rerequested info above since have not receibed from [facility]  On 04/22/2018- rerequested info listed above from [facility].   On 04/22/2018 only received cover sheet from [facility] and called back and spoke with nurse, Anderson Malta to state had only received fax.   04/23/2018- ON 04/22/2018 in the pm only received 4 pages of 14 page fax.  Recalled [facility] and stated for them and just send the 5 pages of the current MAR.  I had received demographic face sheet and history bu only one page of the [facility].  They are to refax.

## 2018-04-23 NOTE — Progress Notes (Signed)
Preop instructions have been received by [facility] per Alex at [place].

## 2018-04-23 NOTE — Progress Notes (Addendum)
Preop instructions for: [name] [name]                         Date of Birth  12-01-1950                           Date of Procedure: 04/25/2018       Doctor: Dr Alexis Frock  Time to arrive at [facility]: 3748 am Report to: Admitting  Procedure:cystoscopy, right retrograde pyelogram, ureteroscopy, laser lithotripsy, and stent exchange  Any procedure time changes, MD office will notify you!   Do not eat or drink past midnight the night before your procedure.(To include any tube feedings-must be discontinued)    Take these morning medications only with sips of water.(or give through gastrostomy or feeding tube). Carbidopa-Levodopa, Ranitidine, Azelastine nasal spray, Flonase nasal spray, Loratidine ( if due to take), Systane eye drops if needed,  Note: No Insulin or Diabetic meds should be given or taken the morning of the procedure!   Facility contact: [place]                  [place] POA:  Transportation contact phone#: [facility]   Please send day of procedure:current med list and meds last taken that day, confirm nothing by mouth status from what time, Patient Demographic info( to include DNR status, problem list, allergies)   RN contact name/phone#:  [place]                            and Fax #: [phone]  Bring Insurance card and picture ID Leave all jewelry and other valuables at place where living( no metal or rings to be worn) No contact lens Women-no make-up, no lotions,perfumes,powders Men-no colognes,lotions  Any questions day of procedure,call Short Stay [phone]   Sent from :[facility]                   [place]                   [phone]  Sent by : Gillian Shields RN

## 2018-04-24 ENCOUNTER — Ambulatory Visit: Payer: PPO | Admitting: Gastroenterology

## 2018-04-24 MED ORDER — DEXTROSE 5 % IV SOLN
460.0000 mg | INTRAVENOUS | Status: AC
  Administered 2018-04-25: 460 mg via INTRAVENOUS
  Filled 2018-04-24: qty 11.5

## 2018-04-24 NOTE — Progress Notes (Signed)
LVMM for son , [name] [name] regarding date of surgery- 04/25/2018 time of surgery- 1115am with arrival at 0845am.  Left call back phone number of [phone].

## 2018-04-25 ENCOUNTER — Ambulatory Visit (HOSPITAL_COMMUNITY): Payer: PPO | Admitting: Certified Registered Nurse Anesthetist

## 2018-04-25 ENCOUNTER — Encounter (HOSPITAL_COMMUNITY): Payer: Self-pay | Admitting: Emergency Medicine

## 2018-04-25 ENCOUNTER — Other Ambulatory Visit: Payer: Self-pay

## 2018-04-25 ENCOUNTER — Ambulatory Visit (HOSPITAL_COMMUNITY)
Admission: RE | Admit: 2018-04-25 | Discharge: 2018-04-25 | Disposition: A | Discharge status: Skilled Nursing Facility | Payer: PPO | Source: Ambulatory Visit | Attending: Urology | Admitting: Urology

## 2018-04-25 ENCOUNTER — Encounter (HOSPITAL_COMMUNITY)
Admission: RE | Disposition: A | Discharge status: Skilled Nursing Facility | Payer: Self-pay | Source: Ambulatory Visit | Attending: Urology

## 2018-04-25 ENCOUNTER — Ambulatory Visit (HOSPITAL_COMMUNITY): Payer: PPO

## 2018-04-25 DIAGNOSIS — N201 Calculus of ureter: Secondary | ICD-10-CM | POA: Diagnosis not present

## 2018-04-25 DIAGNOSIS — Z794 Long term (current) use of insulin: Secondary | ICD-10-CM | POA: Insufficient documentation

## 2018-04-25 DIAGNOSIS — N202 Calculus of kidney with calculus of ureter: Secondary | ICD-10-CM | POA: Insufficient documentation

## 2018-04-25 DIAGNOSIS — I5032 Chronic diastolic (congestive) heart failure: Secondary | ICD-10-CM | POA: Insufficient documentation

## 2018-04-25 DIAGNOSIS — Z7982 Long term (current) use of aspirin: Secondary | ICD-10-CM | POA: Diagnosis not present

## 2018-04-25 DIAGNOSIS — Z6835 Body mass index (BMI) 35.0-35.9, adult: Secondary | ICD-10-CM | POA: Insufficient documentation

## 2018-04-25 DIAGNOSIS — Z886 Allergy status to analgesic agent status: Secondary | ICD-10-CM | POA: Diagnosis not present

## 2018-04-25 DIAGNOSIS — Z888 Allergy status to other drugs, medicaments and biological substances status: Secondary | ICD-10-CM | POA: Diagnosis not present

## 2018-04-25 DIAGNOSIS — E1142 Type 2 diabetes mellitus with diabetic polyneuropathy: Secondary | ICD-10-CM | POA: Diagnosis not present

## 2018-04-25 DIAGNOSIS — I110 Hypertensive heart disease with heart failure: Secondary | ICD-10-CM | POA: Insufficient documentation

## 2018-04-25 DIAGNOSIS — I5031 Acute diastolic (congestive) heart failure: Secondary | ICD-10-CM | POA: Diagnosis not present

## 2018-04-25 DIAGNOSIS — H04129 Dry eye syndrome of unspecified lacrimal gland: Secondary | ICD-10-CM | POA: Insufficient documentation

## 2018-04-25 DIAGNOSIS — Z79899 Other long term (current) drug therapy: Secondary | ICD-10-CM | POA: Diagnosis not present

## 2018-04-25 DIAGNOSIS — N1330 Unspecified hydronephrosis: Secondary | ICD-10-CM | POA: Diagnosis not present

## 2018-04-25 DIAGNOSIS — E785 Hyperlipidemia, unspecified: Secondary | ICD-10-CM | POA: Diagnosis not present

## 2018-04-25 DIAGNOSIS — F329 Major depressive disorder, single episode, unspecified: Secondary | ICD-10-CM | POA: Insufficient documentation

## 2018-04-25 DIAGNOSIS — G231 Progressive supranuclear ophthalmoplegia [Steele-Richardson-Olszewski]: Secondary | ICD-10-CM | POA: Insufficient documentation

## 2018-04-25 DIAGNOSIS — K219 Gastro-esophageal reflux disease without esophagitis: Secondary | ICD-10-CM | POA: Diagnosis not present

## 2018-04-25 DIAGNOSIS — Z87442 Personal history of urinary calculi: Secondary | ICD-10-CM | POA: Diagnosis not present

## 2018-04-25 HISTORY — DX: Morbid (severe) obesity due to excess calories: E66.01

## 2018-04-25 HISTORY — DX: Bradycardia, unspecified: R00.1

## 2018-04-25 HISTORY — DX: Tubulo-interstitial nephritis, not specified as acute or chronic: N12

## 2018-04-25 HISTORY — DX: Hypokalemia: E87.6

## 2018-04-25 HISTORY — DX: Gastro-esophageal reflux disease without esophagitis: K21.9

## 2018-04-25 HISTORY — DX: Constipation, unspecified: K59.00

## 2018-04-25 HISTORY — DX: Severe sepsis with septic shock: R65.21

## 2018-04-25 HISTORY — PX: HOLMIUM LASER APPLICATION: SHX5852

## 2018-04-25 HISTORY — DX: Spinal stenosis, cervical region: M48.02

## 2018-04-25 HISTORY — DX: Acute kidney failure, unspecified: N17.9

## 2018-04-25 HISTORY — PX: CYSTOSCOPY WITH RETROGRADE PYELOGRAM, URETEROSCOPY AND STENT PLACEMENT: SHX5789

## 2018-04-25 HISTORY — DX: Radiculopathy, lumbar region: M54.16

## 2018-04-25 HISTORY — DX: Dry eye syndrome of bilateral lacrimal glands: H04.123

## 2018-04-25 HISTORY — DX: Urinary tract infection, site not specified: N39.0

## 2018-04-25 HISTORY — DX: Progressive supranuclear ophthalmoplegia (steele-Richardson-olszewski): G23.1

## 2018-04-25 HISTORY — DX: Pyonephrosis: N13.6

## 2018-04-25 HISTORY — DX: Respiratory failure, unspecified, unspecified whether with hypoxia or hypercapnia: J96.90

## 2018-04-25 HISTORY — DX: Diverticulosis of large intestine without perforation or abscess with bleeding: K57.31

## 2018-04-25 HISTORY — DX: Heart failure, unspecified: I50.9

## 2018-04-25 LAB — BASIC METABOLIC PANEL
Anion gap: 9 (ref 5–15)
BUN: 11 mg/dL (ref 6–20)
CHLORIDE: 106 mmol/L (ref 101–111)
CO2: 28 mmol/L (ref 22–32)
Calcium: 9.5 mg/dL (ref 8.9–10.3)
Creatinine, Ser: 0.72 mg/dL (ref 0.44–1.00)
GFR calc Af Amer: >60 mL/min (ref >60)
GLUCOSE: 140 mg/dL — AB (ref 65–99)
POTASSIUM: 3.8 mmol/L (ref 3.5–5.1)
Sodium: 143 mmol/L (ref 135–145)

## 2018-04-25 LAB — CBC
HCT: 39.4 % (ref 36.0–46.0)
HEMOGLOBIN: 12.5 g/dL (ref 12.0–15.0)
MCH: 28.8 pg (ref 26.0–34.0)
MCHC: 31.7 g/dL (ref 30.0–36.0)
MCV: 90.8 fL (ref 78.0–100.0)
Platelets: 273 10*3/uL (ref 150–400)
RBC: 4.34 MIL/uL (ref 3.87–5.11)
RDW: 13.4 % (ref 11.5–15.5)
WBC: 7.3 10*3/uL (ref 4.0–10.5)

## 2018-04-25 LAB — HEMOGLOBIN A1C
Hgb A1c MFr Bld: 6.5 % — ABNORMAL HIGH (ref 4.8–5.6)
Mean Plasma Glucose: 139.85 mg/dL

## 2018-04-25 LAB — GLUCOSE, CAPILLARY
Glucose-Capillary: 132 mg/dL — ABNORMAL HIGH (ref 65–99)
Glucose-Capillary: 114 mg/dL — ABNORMAL HIGH (ref 65–99)

## 2018-04-25 SURGERY — CYSTOURETEROSCOPY, WITH RETROGRADE PYELOGRAM AND STENT INSERTION
Anesthesia: General | Laterality: Right

## 2018-04-25 MED ORDER — LIDOCAINE 2% (20 MG/ML) 5 ML SYRINGE
INTRAMUSCULAR | Status: AC
  Filled 2018-04-25: qty 5

## 2018-04-25 MED ORDER — MIDAZOLAM HCL 5 MG/5ML IJ SOLN
INTRAMUSCULAR | Status: DC | PRN
  Administered 2018-04-25: 2 mg via INTRAVENOUS

## 2018-04-25 MED ORDER — DEXAMETHASONE SODIUM PHOSPHATE 10 MG/ML IJ SOLN
INTRAMUSCULAR | Status: AC
  Filled 2018-04-25: qty 1

## 2018-04-25 MED ORDER — SODIUM CHLORIDE 0.9 % IR SOLN
Status: DC | PRN
  Administered 2018-04-25: 6000 mL via INTRAVESICAL

## 2018-04-25 MED ORDER — MIDAZOLAM HCL 2 MG/2ML IJ SOLN
INTRAMUSCULAR | Status: AC
  Filled 2018-04-25: qty 2

## 2018-04-25 MED ORDER — FENTANYL CITRATE (PF) 100 MCG/2ML IJ SOLN
INTRAMUSCULAR | Status: AC
  Filled 2018-04-25: qty 2

## 2018-04-25 MED ORDER — LACTATED RINGERS IV SOLN
INTRAVENOUS | Status: DC
  Administered 2018-04-25: 10:00:00 via INTRAVENOUS

## 2018-04-25 MED ORDER — CEFDINIR 300 MG PO CAPS: 300.0000 mg | ORAL_CAPSULE | Freq: Two times a day (BID) | ORAL | 0 refills | Status: DC

## 2018-04-25 MED ORDER — OXYCODONE HCL 5 MG PO TABS: 5.0000 mg | ORAL_TABLET | Freq: Once | ORAL | Status: DC | PRN

## 2018-04-25 MED ORDER — EPHEDRINE SULFATE 50 MG/ML IJ SOLN
INTRAMUSCULAR | Status: DC | PRN
  Administered 2018-04-25: 5 mg via INTRAVENOUS

## 2018-04-25 MED ORDER — ONDANSETRON HCL 4 MG/2ML IJ SOLN
INTRAMUSCULAR | Status: DC | PRN
  Administered 2018-04-25: 4 mg via INTRAVENOUS

## 2018-04-25 MED ORDER — PROMETHAZINE HCL 25 MG/ML IJ SOLN: 6.2500 mg | INTRAMUSCULAR | Status: DC | PRN

## 2018-04-25 MED ORDER — ONDANSETRON HCL 4 MG/2ML IJ SOLN
INTRAMUSCULAR | Status: AC
  Filled 2018-04-25: qty 2

## 2018-04-25 MED ORDER — OXYCODONE HCL 5 MG/5ML PO SOLN
5.0000 mg | Freq: Once | ORAL | Status: DC | PRN
  Filled 2018-04-25: qty 5

## 2018-04-25 MED ORDER — TRAMADOL HCL 50 MG PO TABS: 50.0000 mg | ORAL_TABLET | Freq: Four times a day (QID) | ORAL | 0 refills | Status: AC | PRN

## 2018-04-25 MED ORDER — FENTANYL CITRATE (PF) 100 MCG/2ML IJ SOLN
INTRAMUSCULAR | Status: DC | PRN
  Administered 2018-04-25 (×3): 25 ug via INTRAVENOUS

## 2018-04-25 MED ORDER — FENTANYL CITRATE (PF) 100 MCG/2ML IJ SOLN: 25.0000 ug | INTRAMUSCULAR | Status: DC | PRN

## 2018-04-25 MED ORDER — PHENYLEPHRINE 40 MCG/ML (10ML) SYRINGE FOR IV PUSH (FOR BLOOD PRESSURE SUPPORT)
PREFILLED_SYRINGE | INTRAVENOUS | Status: AC
  Filled 2018-04-25: qty 10

## 2018-04-25 MED ORDER — PHENYLEPHRINE HCL 10 MG/ML IJ SOLN
INTRAMUSCULAR | Status: DC | PRN
  Administered 2018-04-25 (×3): 80 ug via INTRAVENOUS
  Administered 2018-04-25: 120 ug via INTRAVENOUS
  Administered 2018-04-25: 40 ug via INTRAVENOUS
  Administered 2018-04-25: 80 ug via INTRAVENOUS

## 2018-04-25 MED ORDER — DEXAMETHASONE SODIUM PHOSPHATE 4 MG/ML IJ SOLN
INTRAMUSCULAR | Status: DC | PRN
  Administered 2018-04-25: 10 mg via INTRAVENOUS

## 2018-04-25 SURGICAL SUPPLY — 26 items
BAG URO CATCHER STRL LF (MISCELLANEOUS) ×3 IMPLANT
BASKET LASER NITINOL 1.9FR (BASKET) ×3 IMPLANT
CATH INTERMIT  6FR 70CM (CATHETERS) ×3 IMPLANT
CLOTH BEACON ORANGE TIMEOUT ST (SAFETY) ×3 IMPLANT
COVER FOOTSWITCH UNIV (MISCELLANEOUS) IMPLANT
COVER SURGICAL LIGHT HANDLE (MISCELLANEOUS) ×3 IMPLANT
EXTRACTOR STONE 1.7FRX115CM (UROLOGICAL SUPPLIES) IMPLANT
FIBER LASER FLEXIVA 1000 (UROLOGICAL SUPPLIES) IMPLANT
FIBER LASER FLEXIVA 365 (UROLOGICAL SUPPLIES) IMPLANT
FIBER LASER FLEXIVA 550 (UROLOGICAL SUPPLIES) IMPLANT
FIBER LASER TRAC TIP (UROLOGICAL SUPPLIES) ×3 IMPLANT
GLOVE BIOGEL M STRL SZ7.5 (GLOVE) ×3 IMPLANT
GOWN STRL REUS W/TWL LRG LVL3 (GOWN DISPOSABLE) ×6 IMPLANT
GUIDEWIRE ANG ZIPWIRE 038X150 (WIRE) ×6 IMPLANT
GUIDEWIRE STR DUAL SENSOR (WIRE) ×3 IMPLANT
IV NS 1000ML (IV SOLUTION) ×2
IV NS 1000ML BAXH (IV SOLUTION) ×1 IMPLANT
MANIFOLD NEPTUNE II (INSTRUMENTS) ×3 IMPLANT
PACK CYSTO (CUSTOM PROCEDURE TRAY) ×3 IMPLANT
SHEATH URETERAL 12FRX28CM (UROLOGICAL SUPPLIES) ×3 IMPLANT
SHEATH URETERAL 12FRX35CM (MISCELLANEOUS) IMPLANT
STENT POLARIS 5FRX24 (STENTS) ×3 IMPLANT
SYR CONTROL 10ML LL (SYRINGE) ×3 IMPLANT
TUBE FEEDING 8FR 16IN STR KANG (MISCELLANEOUS) ×3 IMPLANT
TUBING CONNECTING 10 (TUBING) ×2 IMPLANT
TUBING CONNECTING 10' (TUBING) ×1

## 2018-04-25 NOTE — H&P (Signed)
[name] [name] is an 67 y.o. female.     Chief Complaint:   HPI:   1 - Right Ureteral / Renal Stones - w/p Rt JJ stent 5/18 for 107mm ureteral stone + scattered Rt renal steons (about 1cm total volume).   2 - Urosepsis- fevers, leukocytosis, bacteruria c/w primary urosepsis at presentation 03/28/18. Placed on Vanc + Zosyn, narrowed to Zosyn. [place], [place] 5/17 scant growth to date. Last day of ABX 5/24.   3 - Acute Renal Failure - Cr to 1.8s during admission for urosepsis / Rt stone 03/2018. Baseline Cr <1. She has been on vancomycin and pressors. Resolved with Cr 1.1's later in admission.   Today "[name] [name]" is seen to proceed with RIGHT ureteroscopic stone manipulation to stone free. No interval fevers. She has been on pre-op ABX empiric.      Past Medical History:  Diagnosis Date  . Acute kidney failure (HCC)    hx of   . Arthritis    fingers,right shoulder  . Bradycardia   . 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 CHF (congestive heart failure) (HCC)    acute diastolic ( congestive ) heart failure)   . Chronic lumbar radiculopathy    L5- S1  right leg  . Constipation   . Depression   . Diverticulosis large intestine w/o perforation or abscess w/bleeding   . Dizziness   . Dry eye syndrome of bilateral lacrimal glands   . Frequency of urination   . GERD (gastroesophageal reflux disease)   . Hard of hearing   . History of kidney stones 05/12/15   surgery   . HTN (hypertension)   . Hyperlipidemia   . Hypokalemia   . Morbid obesity due to excess calories ([place])   . 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[place])   . 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[place])   . Numbness and tingling in hands   . OSA (obstructive sleep apnea)    moderate OSA per study 11-22-2007--  refused CPAP but used oxygen for 6 months at night,  states due to wt loss stopped using oxygen  . Polyneuropathy, diabetic ([place])    WALKS W/ CANE FOR BALANCE  . Progressive supranuclear palsy ([place])   .  Pyonephrosis   . Radiculopathy of lumbar region   . Respiratory failure (HCC)    hx of acute respiratory failure wtih hypoxia   . Right ureteral stone   . Severe sepsis with septic shock (CODE) ([place])   . Shortness of breath dyspnea   . Spinal stenosis, cervical region   . SUI (stress urinary incontinence, female)   . Tubulo-interstitial nephritis   . Type 2 diabetes mellitus (HCC)    Type II - Diet controlled  . Urinary incontinence   . Urinary tract infection   . Wears glasses     Past Surgical History:  Procedure Laterality Date  . ANTERIOR CERVICAL DECOMP/DISCECTOMY FUSION N/A 03/23/2016   Procedure: Cervical Four-Five, Cervical Five-Six, Cervical Six-Seven Anterior cervical decompression/diskectomy/fusion;  Surgeon: Leeroy Cha, MD;  Location: [facility];  Service: Neurosurgery;  Laterality: N/A;  C4-5 C5-6 C6-7 Anterior cervical decompression/diskectomy/fusion  . CARDIOVASCULAR STRESS TEST  09-30-2007     normal Adenosine study/  no ischemia /  normal LV function and wall motion , ef 82%  . COLONOSCOPY    . CYSTOSCOPY WITH RETROGRADE PYELOGRAM, URETEROSCOPY AND STENT PLACEMENT Right 05/12/2015   Procedure: CYSTOSCOPY WITH RETROGRADE PYELOGRAM, URETEROSCOPY,STONE EXTRACTION AND STENT PLACEMENT;  Surgeon: Franchot Gallo, MD;  Location: [facility];  Service: Urology;  Laterality: Right;  . CYSTOSCOPY WITH STENT PLACEMENT Right 03/28/2018   Procedure: CYSTOSCOPY WITH STENT PLACEMENT retrograde pylegram;  Surgeon: Alexis Frock, MD;  Location: [facility];  Service: Urology;  Laterality: Right;  . EXTRACORPOREAL SHOCK WAVE LITHOTRIPSY Right 08-18-2012  . HOLMIUM LASER APPLICATION Right 06/23/7516   Procedure: HOLMIUM LASER APPLICATION;  Surgeon: Franchot Gallo, MD;  Location: [facility];  Service: Urology;  Laterality: Right;  . ORIF HUMERUS FRACTURE Right 07/20/2016   Procedure: OPEN REDUCTION INTERNAL FIXATION (ORIF) PROXIMAL HUMERUS FRACTURE VS  REVERSE TOTAL SHOULDER ARTHROPLASTY;  Surgeon: Netta Cedars, MD;  Location: [facility];  Service: Orthopedics;  Laterality: Right;  . SHOULDER ARTHROSCOPY Right 07/2016  . TRANSTHORACIC ECHOCARDIOGRAM  09-23-2007   pseudonormal LV filling pattern,  ef 65-70%/  trivial AR/  mild LAE  . TUBAL LIGATION  1985    Family History  Problem Relation Age of Onset  . Heart Problems Father   . Stroke Mother   . Hypertension Unknown   . Stroke Unknown   . Breast cancer Sister    Social History:  reports that she has never smoked. She has never used smokeless tobacco. She reports that she drinks alcohol. She reports that she does not use drugs.  Allergies:  Allergies  Allergen Reactions  . Aleve [Naproxen] Hives, Shortness Of Breath and Rash    Pt Avoids All Nsaids  . Zocor [Simvastatin] Other (See Comments)    "weird feeling all over body"    No medications prior to admission.    No results found for this or any previous visit (from the past 48 hour(s)). No results found.  Review of Systems  Constitutional: Negative.  Negative for chills and fever.  HENT: Negative.   Eyes: Negative.   Respiratory: Negative.   Cardiovascular: Negative.   Gastrointestinal: Negative.   Genitourinary: Positive for urgency.  Musculoskeletal: Negative.   Skin: Negative.   Neurological: Negative.   Endo/Heme/Allergies: Negative.   Psychiatric/Behavioral: Negative.     There were no vitals taken for this visit. Physical Exam  Constitutional: She appears well-developed.  Appears older than stated age  HENT:  Head: Normocephalic.  Eyes: Pupils are equal, round, and reactive to light.  Neck: Normal range of motion.  Cardiovascular: Normal rate.  Respiratory: Effort normal.  GI:  Stable truncal obesity.   Musculoskeletal: Normal range of motion.  Neurological: She is alert.  Skin: Skin is warm.  Psychiatric: She has a normal mood and affect.     Assessment/Plan  Proceed as planned with RIGHT  ureteroscopic stone manipulation  / stent exchange . Risks, benefits, alternatives, expected peri-op course discussed previously and reiterated today.   Alexis Frock, MD 04/25/2018, 7:17 AM

## 2018-04-25 NOTE — Brief Op Note (Signed)
04/25/2018  1:27 PM  PATIENT:  [name] [name]  68 y.o. female  PRE-OPERATIVE DIAGNOSIS:  RIGHT URETERAL AND RENAL STONES  POST-OPERATIVE DIAGNOSIS:  RIGHT URETERAL AND RENAL STONES  PROCEDURE:  Procedure(s): CYSTOSCOPY WITH RETROGRADE PYELOGRAM, URETEROSCOPY AND STENT EXCHANGE (Right) HOLMIUM LASER APPLICATION (Right)  SURGEON:  Surgeon(s) and Role:    Alexis Frock, MD - Primary  PHYSICIAN ASSISTANT:   ASSISTANTS: none   ANESTHESIA:   general  EBL:  minimal   BLOOD ADMINISTERED:none  DRAINS: none   LOCAL MEDICATIONS USED:  NONE  SPECIMEN:  Source of Specimen:  Rt renal / ureteral stone fragments  DISPOSITION OF SPECIMEN:  [facility] for compositional analysis  COUNTS:  YES  TOURNIQUET:  * No tourniquets in log *  DICTATION: .Other Dictation: Dictation Number [id number]  PLAN OF CARE: Discharge to home after PACU  PATIENT DISPOSITION:  PACU - hemodynamically stable.   Delay start of Pharmacological VTE agent (>24hrs) due to surgical blood loss or risk of bleeding: yes

## 2018-04-25 NOTE — Anesthesia Procedure Notes (Signed)
Procedure Name: LMA Insertion Date/Time: 04/25/2018 12:35 PM Performed by: Deliah Boston, CRNA Pre-anesthesia Checklist: Patient identified, Emergency Drugs available, Suction available and Patient being monitored Patient Re-evaluated:Patient Re-evaluated prior to induction Oxygen Delivery Method: Circle system utilized Preoxygenation: Pre-oxygenation with 100% oxygen Induction Type: IV induction Ventilation: Mask ventilation without difficulty LMA: LMA inserted LMA Size: 4.0 Number of attempts: 1 Placement Confirmation: positive ETCO2 and breath sounds checked- equal and bilateral Tube secured with: Tape Dental Injury: Teeth and Oropharynx as per pre-operative assessment

## 2018-04-25 NOTE — Discharge Instructions (Signed)
1 - You may have urinary urgency (bladder spasms) and bloody urine on / off with stent in place. This is normal.  2 - Pull tethered stent on Monday morning by pulling on string, then blue-white plastic tubing and discarding. Office is open Monday if any issues arise.   3 - Call MD or go to ER for fever >102, severe pain / nausea / vomiting not relieved by medications, or acute change in medical status

## 2018-04-25 NOTE — Progress Notes (Signed)
Belongings placed in PACU locker. Pt's wheelchair placed in PACU next to lockers. Carbidopa/Levidopa medication was counted with pt and brought down to pharmacy to hold due to no family available to keep up with her belongings. Clovia Cuff, PACU RN notified of this information and receipt placed in patient's chart.   Dr. Kalman Shan notified of EKG that was taken this morning upon pt arrival.

## 2018-04-25 NOTE — Transfer of Care (Signed)
Immediate Anesthesia Transfer of Care Note  Patient: [name] [name]  Procedure(s) Performed: Procedure(s): CYSTOSCOPY WITH RETROGRADE PYELOGRAM, URETEROSCOPY AND STENT EXCHANGE (Right) HOLMIUM LASER APPLICATION (Right)  Patient Location: PACU  Anesthesia Type:General  Level of Consciousness: Patient easily awoken, sedated, comfortable, cooperative, following commands, responds to stimulation.   Airway & Oxygen Therapy: Patient spontaneously breathing, ventilating well, oxygen via simple oxygen mask.  Post-op Assessment: Report given to PACU RN, vital signs reviewed and stable, moving all extremities.   Post vital signs: Reviewed and stable.  Complications: No apparent anesthesia complications  Last Vitals:  Vitals Value Taken Time  BP 139/67 04/25/2018  1:45 PM  Temp    Pulse 81 04/25/2018  1:46 PM  Resp 19 04/25/2018  1:46 PM  SpO2 99 % 04/25/2018  1:46 PM  Vitals shown include unvalidated device data.  Last Pain:  Vitals:   04/25/18 0952  TempSrc:   PainSc: 0-No pain      Patients Stated Pain Goal: 4 (66/44/03 4742)  Complications: No apparent anesthesia complications

## 2018-04-25 NOTE — Anesthesia Preprocedure Evaluation (Signed)
Anesthesia Evaluation  Patient identified by MRN, date of birth, ID band Patient awake    Reviewed: Allergy & Precautions, NPO status , Patient's Chart, lab work & pertinent test results  Airway Mallampati: II  TM Distance: <3 FB Neck ROM: Limited    Dental no notable dental hx.    Pulmonary neg pulmonary ROS, sleep apnea ,    Pulmonary exam normal breath sounds clear to auscultation       Cardiovascular hypertension, Normal cardiovascular exam Rhythm:Regular Rate:Normal     Neuro/Psych negative neurological ROS  negative psych ROS   GI/Hepatic Neg liver ROS, GERD  Medicated,  Endo/Other  diabetes  Renal/GU negative Renal ROS  negative genitourinary   Musculoskeletal negative musculoskeletal ROS (+)   Abdominal   Peds negative pediatric ROS (+)  Hematology negative hematology ROS (+)   Anesthesia Other Findings   Reproductive/Obstetrics negative OB ROS                             Anesthesia Physical Anesthesia Plan  ASA: III  Anesthesia Plan: General   Post-op Pain Management:    Induction: Intravenous  PONV Risk Score and Plan: 3 and Ondansetron, Dexamethasone and Treatment may vary due to age or medical condition  Airway Management Planned: LMA  Additional Equipment:   Intra-op Plan:   Post-operative Plan:   Informed Consent: I have reviewed the patients History and Physical, chart, labs and discussed the procedure including the risks, benefits and alternatives for the proposed anesthesia with the patient or authorized representative who has indicated his/her understanding and acceptance.   Dental advisory given  Plan Discussed with: CRNA and Surgeon  Anesthesia Plan Comments:         Anesthesia Quick Evaluation

## 2018-04-25 NOTE — Progress Notes (Signed)
Pharmacy [facility] [facility] came to go over pt's medication.

## 2018-04-25 NOTE — Anesthesia Postprocedure Evaluation (Signed)
Anesthesia Post Note  Patient: [name] [name]  Procedure(s) Performed: CYSTOSCOPY WITH RETROGRADE PYELOGRAM, URETEROSCOPY AND STENT EXCHANGE (Right ) HOLMIUM LASER APPLICATION (Right )     Patient location during evaluation: PACU Anesthesia Type: General Level of consciousness: awake and alert Pain management: pain level controlled Vital Signs Assessment: post-procedure vital signs reviewed and stable Respiratory status: spontaneous breathing, nonlabored ventilation, respiratory function stable and patient connected to nasal cannula oxygen Cardiovascular status: blood pressure returned to baseline and stable Postop Assessment: no apparent nausea or vomiting Anesthetic complications: no    Last Vitals:  Vitals:   04/25/18 1345 04/25/18 1400  BP: 139/67 120/60  Pulse: 80 84  Resp: 20 18  Temp:    SpO2: 98% 98%    Last Pain:  Vitals:   04/25/18 0952  TempSrc:   PainSc: 0-No pain                 Yeila Morro S

## 2018-04-26 ENCOUNTER — Encounter (HOSPITAL_COMMUNITY): Payer: Self-pay | Admitting: Urology

## 2018-04-26 NOTE — Op Note (Signed)
NAMEDERIKA, [name] MEDICAL RECORD [id number] ACCOUNT [id number] DATE OF BIRTH:[birth year] FACILITY: [facility] LOCATION: [facility] PHYSICIAN:Ashantia Amaral, MD  OPERATIVE REPORT  DATE OF PROCEDURE:  04/25/2018  PREOPERATIVE DIAGNOSES:  Right ureteral and renal stones, history of urosepsis.  PROCEDURE PERFORMED: 1.  Cystoscopy with right pyelogram interpretation. 2.  Right ureteroscopy with laser lithotripsy. 3.  Insertion of right primary exchange of right ureteral stent 5 x 24 Polaris with tether.  ESTIMATED BLOOD LOSS:  Nil.  COMPLICATIONS:  None.  SPECIMENS:  Right ureteral and renal stone fragments for composition analysis.  FINDINGS: 1.  Right distal ureteral stone, mild hydronephrosis. 2.  Multifocal right intrarenal stones, total stone volume approximately 1 sq cm. 3.  Complete resolution of all stone fragments larger than 130 mm following laser lithotripsy and basket extraction. 4.  Successful replacement of right ureteral stent proximally in renal pelvis, distally in urinary bladder with tether.  INDICATIONS:  The patient is a 67 year old lady with an unfortunate history of multisystem atrophy.  She was found on workup of flank pain and sepsis to have a right ureteral stone with hydronephrosis last month.  She underwent emergent stenting at that  time for renal decompression and intravenous antibiotics.  She has since cleared infectious parameters.  Culture results showed pansensitive E. coli.  She now presents for definitive stone management.  She has been fever free.  Informed consent was  obtained and placed in the medical record.  DESCRIPTION OF PROCEDURE:  The patient was identified, procedure being a right ureteroscope where stent placement was confirmed.  Procedure time-out was performed and antibiotics were administered.  General LMA anesthesia introduced.  The patient was  placed into a low lithotomy position.  A sterile field was created by prepping and draping  the patient's vagina, introitus, and Bartholin's using iodine.  Cystourethroscopy was performed with a rigid cystoscope with offset lens.  Inspection of the  bladder revealed no diverticula, calcifications, papillary lesions.  The distal end of the right stent was grasped and brought to the level of the urethral meatus.  Through this, a 0.038 ZIPwire was advanced into the lower pole, and it was exchanged for  an open-ended catheter, and right retrograde pyelogram was obtained.  Right retrograde pyelogram demonstrated a single right ureter, single-system right kidney, and there was a mobile filling defect in the distal ureter consistent with known stone.  A 0.038 ZIPwire was once again advanced to the lower pole and set aside as  a safety wire.  An 8-French feeding tube was placed in the bladder for pressure release, and semirigid ureteroscopy was performed the entire length of the right ureter, alongside a separate sensor working wire.  In the distal ureter as expected, there  was a single stone that appeared to be too large for simple basketing.  Holmium laser energy was applied to the stone using settings of 0.2 joules and 20 Hz. It was fragmented into smaller pieces, which were then grasped sequentially in their long axis  for composition analysis.  There were no additional calcifications within the right ureter as the goal today was ipsilateral stone free.  The semirigid scope was exchanged for a 12/14, 28 cm ureteral access sheath to the level of the proximal ureter.   Using continuous fluoroscopic guidance, flexible digital ureteroscopy was performed in the proximal ureter, and a systematic inspection of the right kidney, including all calices x3.  There were several calcifications noted that did appear too large to  pass.  Holmium laser energy was  then once again used to these stones, fragmenting them into pieces 2 mm or smaller, which were then sequentially grasped with the Escape basket and set  aside.  Following these maneuvers, there was excellent hemostasis and  complete resolution of all accessible stone fragments.  We achieved the goals of the surgery today.  The access sheath was removed under continuous vision.  No mucosal abnormalities were found.  Given the access sheath usage, it was felt that after a  brief interval, stenting would be warranted.  As such, a new 5 x 24 Polaris stent was placed over the remaining safety wire using fluoroscopic guidance.  Good proximal and distal points were noted.  The tether was left in place in the fascia of the mons  pubis, and the procedure was terminated.  The patient tolerated the procedure well.  No immediate peri-op complications.  The patient was taken to postanesthesia care in stable condition.  LN/NUANCE  D:04/25/2018 T:04/25/2018 JOB:[id number]

## 2018-04-30 ENCOUNTER — Telehealth: Payer: Self-pay | Admitting: Neurology

## 2018-04-30 NOTE — Telephone Encounter (Signed)
Left message on machine for patient's son to call back.   

## 2018-04-30 NOTE — Telephone Encounter (Signed)
Patient's son returning phone call states from 3pm to 3:30pm he is in a meeting.

## 2018-04-30 NOTE — Telephone Encounter (Signed)
Spoke with patient's son (on [place]). He states patient went into the hospital Mar 30, 2018 for over a week at [facility]. She had surgery for kidney stone/stent with complications. She is now at [facility] in [place]. She is pushing to get out of the rehab facility, but he really needs guidance for what kind of care she requires. I did let him know that at last visit a high level of care was recommended as she should not be walking or transferring without someone with her. He states when she calls for help at this facility it is typically 1-1.5 hours before someone comes to help her. He wants to discuss options for care. I did let him know Janett Billow spoke with patient extensively about this in the past. He would like a call if you are able to speak with him about this as well, Janett Billow.   He also wanted to know about her lifespan. I did let him know that everyone is really different and we couldn't tell him a prognosis on this for the patient.

## 2018-05-01 ENCOUNTER — Telehealth: Payer: Self-pay | Admitting: Psychology

## 2018-05-01 DIAGNOSIS — E119 Type 2 diabetes mellitus without complications: Secondary | ICD-10-CM | POA: Diagnosis not present

## 2018-05-01 DIAGNOSIS — I5030 Unspecified diastolic (congestive) heart failure: Secondary | ICD-10-CM | POA: Diagnosis not present

## 2018-05-01 DIAGNOSIS — N179 Acute kidney failure, unspecified: Secondary | ICD-10-CM | POA: Diagnosis not present

## 2018-05-01 DIAGNOSIS — G4733 Obstructive sleep apnea (adult) (pediatric): Secondary | ICD-10-CM | POA: Diagnosis not present

## 2018-05-01 DIAGNOSIS — N1330 Unspecified hydronephrosis: Secondary | ICD-10-CM | POA: Diagnosis not present

## 2018-05-01 DIAGNOSIS — A419 Sepsis, unspecified organism: Secondary | ICD-10-CM | POA: Diagnosis not present

## 2018-05-01 NOTE — Telephone Encounter (Signed)
Agree with all above.  He should definitely come to the appt for such questions as we have spent a LOT of time with her regarding living situation, prognosis, etc.  Perhaps giving him some literature from the [employer] organization would help him understand the disease.  Average life span is 5-7 years from dx but many patients don't follow this curve and live longer.

## 2018-05-01 NOTE — Telephone Encounter (Signed)
Telephone call with patient's son.  I encouraged him and/or his uncle to be having conversations with the long-term care facility about meeting his mother's needs in terms of time and help with going to the restroom.  If a conversation does not solve this,  I encouraged him to call Geanie Kenning the [place] [employer] long-term care ombudsman to find resolution.  I am going to send the patient's son the information for the long-term care ombudsman and a detailed email with resources on [employer].     In addition, we talked about life expectancy in terms of averages with his mother's condition.  I reinforced the patient needs the type of care that she is currently receiving and does not need to return to independent living.  I encouraged the son to attend the patient's next appointment with Dr. Carles Collet.  This will help him understand his mother's condition, give him the opportunity to ask questions,  and would engage him more in her care.  This may be helpful for him as he is planning financially for care for his mother.  This would also be a good time for me to meet with him and address any continuing concerns that he has related to ensuring that his mom has the best care possible.  He is aware of my contact information and I am more than happy to help him as needed and as appropriate.

## 2018-05-02 DIAGNOSIS — I509 Heart failure, unspecified: Secondary | ICD-10-CM | POA: Diagnosis not present

## 2018-05-02 DIAGNOSIS — N1330 Unspecified hydronephrosis: Secondary | ICD-10-CM | POA: Diagnosis not present

## 2018-05-02 DIAGNOSIS — N179 Acute kidney failure, unspecified: Secondary | ICD-10-CM | POA: Diagnosis not present

## 2018-05-02 DIAGNOSIS — E119 Type 2 diabetes mellitus without complications: Secondary | ICD-10-CM | POA: Diagnosis not present

## 2018-05-02 DIAGNOSIS — A419 Sepsis, unspecified organism: Secondary | ICD-10-CM | POA: Diagnosis not present

## 2018-05-03 DIAGNOSIS — E119 Type 2 diabetes mellitus without complications: Secondary | ICD-10-CM | POA: Diagnosis not present

## 2018-05-03 DIAGNOSIS — M5416 Radiculopathy, lumbar region: Secondary | ICD-10-CM | POA: Diagnosis not present

## 2018-05-03 DIAGNOSIS — N136 Pyonephrosis: Secondary | ICD-10-CM | POA: Diagnosis not present

## 2018-05-03 DIAGNOSIS — I5031 Acute diastolic (congestive) heart failure: Secondary | ICD-10-CM | POA: Diagnosis not present

## 2018-05-03 DIAGNOSIS — A419 Sepsis, unspecified organism: Secondary | ICD-10-CM | POA: Diagnosis not present

## 2018-05-12 DIAGNOSIS — N200 Calculus of kidney: Secondary | ICD-10-CM | POA: Diagnosis not present

## 2018-05-12 DIAGNOSIS — N3281 Overactive bladder: Secondary | ICD-10-CM | POA: Diagnosis not present

## 2018-05-12 DIAGNOSIS — M5416 Radiculopathy, lumbar region: Secondary | ICD-10-CM | POA: Diagnosis not present

## 2018-05-12 DIAGNOSIS — E119 Type 2 diabetes mellitus without complications: Secondary | ICD-10-CM | POA: Diagnosis not present

## 2018-05-12 DIAGNOSIS — I5031 Acute diastolic (congestive) heart failure: Secondary | ICD-10-CM | POA: Diagnosis not present

## 2018-05-12 DIAGNOSIS — N136 Pyonephrosis: Secondary | ICD-10-CM | POA: Diagnosis not present

## 2018-05-12 DIAGNOSIS — A419 Sepsis, unspecified organism: Secondary | ICD-10-CM | POA: Diagnosis not present

## 2018-05-14 DIAGNOSIS — I509 Heart failure, unspecified: Secondary | ICD-10-CM | POA: Diagnosis not present

## 2018-05-14 DIAGNOSIS — N178 Other acute kidney failure: Secondary | ICD-10-CM | POA: Diagnosis not present

## 2018-05-14 DIAGNOSIS — E1140 Type 2 diabetes mellitus with diabetic neuropathy, unspecified: Secondary | ICD-10-CM | POA: Diagnosis not present

## 2018-05-14 DIAGNOSIS — Z6836 Body mass index (BMI) 36.0-36.9, adult: Secondary | ICD-10-CM | POA: Diagnosis not present

## 2018-05-14 DIAGNOSIS — N200 Calculus of kidney: Secondary | ICD-10-CM | POA: Diagnosis not present

## 2018-05-14 DIAGNOSIS — G1220 Motor neuron disease, unspecified: Secondary | ICD-10-CM | POA: Diagnosis not present

## 2018-05-14 DIAGNOSIS — N179 Acute kidney failure, unspecified: Secondary | ICD-10-CM | POA: Diagnosis not present

## 2018-05-14 DIAGNOSIS — M47812 Spondylosis without myelopathy or radiculopathy, cervical region: Secondary | ICD-10-CM | POA: Diagnosis not present

## 2018-05-22 ENCOUNTER — Encounter: Payer: Self-pay | Admitting: Neurology

## 2018-05-27 ENCOUNTER — Emergency Department (HOSPITAL_COMMUNITY): Payer: PPO

## 2018-05-27 ENCOUNTER — Encounter (HOSPITAL_COMMUNITY): Payer: Self-pay | Admitting: Emergency Medicine

## 2018-05-27 ENCOUNTER — Emergency Department (HOSPITAL_COMMUNITY)
Admission: EM | Admit: 2018-05-27 | Discharge: 2018-05-27 | Disposition: A | Discharge status: Home / Self Care | Payer: PPO | Attending: Emergency Medicine | Admitting: Emergency Medicine

## 2018-05-27 DIAGNOSIS — Z7982 Long term (current) use of aspirin: Secondary | ICD-10-CM | POA: Diagnosis not present

## 2018-05-27 DIAGNOSIS — S80211A Abrasion, right knee, initial encounter: Secondary | ICD-10-CM | POA: Insufficient documentation

## 2018-05-27 DIAGNOSIS — Y999 Unspecified external cause status: Secondary | ICD-10-CM | POA: Diagnosis not present

## 2018-05-27 DIAGNOSIS — Z794 Long term (current) use of insulin: Secondary | ICD-10-CM | POA: Diagnosis not present

## 2018-05-27 DIAGNOSIS — M25531 Pain in right wrist: Secondary | ICD-10-CM | POA: Insufficient documentation

## 2018-05-27 DIAGNOSIS — I110 Hypertensive heart disease with heart failure: Secondary | ICD-10-CM | POA: Insufficient documentation

## 2018-05-27 DIAGNOSIS — W19XXXA Unspecified fall, initial encounter: Secondary | ICD-10-CM | POA: Diagnosis not present

## 2018-05-27 DIAGNOSIS — Y92129 Unspecified place in nursing home as the place of occurrence of the external cause: Secondary | ICD-10-CM | POA: Diagnosis not present

## 2018-05-27 DIAGNOSIS — W010XXA Fall on same level from slipping, tripping and stumbling without subsequent striking against object, initial encounter: Secondary | ICD-10-CM | POA: Insufficient documentation

## 2018-05-27 DIAGNOSIS — S199XXA Unspecified injury of neck, initial encounter: Secondary | ICD-10-CM | POA: Diagnosis not present

## 2018-05-27 DIAGNOSIS — I5030 Unspecified diastolic (congestive) heart failure: Secondary | ICD-10-CM | POA: Insufficient documentation

## 2018-05-27 DIAGNOSIS — R0902 Hypoxemia: Secondary | ICD-10-CM | POA: Diagnosis not present

## 2018-05-27 DIAGNOSIS — E119 Type 2 diabetes mellitus without complications: Secondary | ICD-10-CM | POA: Insufficient documentation

## 2018-05-27 DIAGNOSIS — Y9301 Activity, walking, marching and hiking: Secondary | ICD-10-CM | POA: Diagnosis not present

## 2018-05-27 DIAGNOSIS — S8001XA Contusion of right knee, initial encounter: Secondary | ICD-10-CM | POA: Diagnosis not present

## 2018-05-27 DIAGNOSIS — S0181XA Laceration without foreign body of other part of head, initial encounter: Secondary | ICD-10-CM | POA: Diagnosis not present

## 2018-05-27 DIAGNOSIS — S0990XA Unspecified injury of head, initial encounter: Secondary | ICD-10-CM | POA: Diagnosis not present

## 2018-05-27 DIAGNOSIS — Z79899 Other long term (current) drug therapy: Secondary | ICD-10-CM | POA: Diagnosis not present

## 2018-05-27 MED ORDER — LIDOCAINE HCL 2 % IJ SOLN
20.0000 mL | Freq: Once | INTRAMUSCULAR | Status: AC
  Administered 2018-05-27: 400 mg via INTRADERMAL
  Filled 2018-05-27: qty 20

## 2018-05-27 NOTE — ED Triage Notes (Signed)
Patient arrived from [facility] [address] via EMS. Patient was reported to have fallen while ambulating with her walker. Laceration to her right forehead and a skin tear to right lower arm.

## 2018-05-27 NOTE — Discharge Instructions (Signed)
Return here as needed.  Follow-up with your doctor have sutures removed in 5 days.  Keep the area clean and dry.

## 2018-05-27 NOTE — ED Notes (Addendum)
Patient's Independent facility is called by this RN to notify of her d/c. Her friend states that he will take her back to the facility. [facility]) acknowledges that they know Dale(friend of patient), and that if she is okay with it, [name] "can bring her to the facility"  Patient is agreeable to d/c with [name]. Patient is dressed and assisted to the vehicle

## 2018-05-27 NOTE — ED Provider Notes (Signed)
[facility] EMERGENCY DEPARTMENT Provider Note   CSN: [id number] Arrival date & time: 05/27/18  1143     History   Chief Complaint Chief Complaint  Patient presents with  . Fall    HPI ANALY BASSFORD is a 67 y.o. female.  HPI Patient presents to the emergency department with a fall at her nursing facility.  The patient states that just prior to arrival she was walking with her walker and she stumbled forward hitting her knee wrist and her right forehead.  The patient has a right forehead laceration.  Patient was given direct pressure to this wound to control bleeding.  Patient states she did not have any syncope with this.  The patient denies chest pain, shortness of breath, headache,blurred vision, neck pain, fever, cough, weakness, numbness, dizziness, anorexia, edema, abdominal pain, nausea, vomiting, diarrhea, rash, back pain, dysuria, hematemesis, bloody stool, near syncope, or syncope. Past Medical History:  Diagnosis Date  . Acute kidney failure (HCC)    hx of   . Arthritis    fingers,right shoulder  . Bradycardia   . 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 CHF (congestive heart failure) (HCC)    acute diastolic ( congestive ) heart failure)   . Chronic lumbar radiculopathy    L5- S1  right leg  . Constipation   . Depression   . Diverticulosis large intestine w/o perforation or abscess w/bleeding   . Dizziness   . Dry eye syndrome of bilateral lacrimal glands   . Frequency of urination   . GERD (gastroesophageal reflux disease)   . Hard of hearing   . History of kidney stones 05/12/15   surgery   . HTN (hypertension)   . Hyperlipidemia   . Hypokalemia   . Morbid obesity due to excess calories ([place])   . 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 Numbness and tingling in hands   . OSA (obstructive sleep apnea)    moderate OSA per study 11-22-2007--  refused CPAP but used oxygen for  6 months at night,  states due to wt loss stopped using oxygen  . Polyneuropathy, diabetic ([place])    WALKS W/ CANE FOR BALANCE  . 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 Pyonephrosis   . Pyonephrosis   . Radiculopathy of lumbar region   . Respiratory failure (HCC)    hx of acute respiratory failure wtih hypoxia   . Right ureteral stone   . Severe sepsis with septic shock (CODE) ([place])   . Shortness of breath dyspnea   . Spinal stenosis, cervical region   . SUI (stress urinary incontinence, female)   . Tubulo-interstitial nephritis   . Type 2 diabetes mellitus (HCC)    Type II - Diet controlled  . Urinary incontinence   . Urinary tract infection   . Wears glasses     Patient Active Problem List   Diagnosis Date Noted  . Bradycardia 04/01/2018  . UTI (urinary tract infection) 04/01/2018  . ARF (acute renal failure) ([place]) 04/01/2018  . Right ureteral stone 04/01/2018  . Acute diastolic heart failure ([place]) 04/01/2018  . Acute respiratory failure with hypoxia ([place])   . Pyelonephritis   . Severe sepsis ([place])   . Pyohydronephrosis 03/29/2018  . Septic shock ([place]) 03/29/2018  . Hypokalemia 03/28/2018  . Severe sepsis with septic shock ([place]) 03/28/2018  .  Elevated lactic acid level 03/28/2018  . Elevated troponin 03/28/2018  . PSP (progressive supranuclear palsy) ([place]) 02/18/2018  . Chronic cough 11/19/2017  . 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 Cervical stenosis of spinal canal 03/23/2016  . OSA on CPAP 12/14/2015  . Dizziness and giddiness 11/24/2013  . Diabetes type 2, uncontrolled ([place]) 11/24/2013  . Severe obesity (BMI >= 40) ([place]) 11/24/2013  . Lumbar radiculopathy 11/24/2013    Past Surgical History:  Procedure Laterality Date  . ANTERIOR CERVICAL DECOMP/DISCECTOMY FUSION N/A 03/23/2016   Procedure: Cervical Four-Five, Cervical Five-Six, Cervical Six-Seven Anterior cervical decompression/diskectomy/fusion;   Surgeon: Leeroy Cha, MD;  Location: [facility];  Service: Neurosurgery;  Laterality: N/A;  C4-5 C5-6 C6-7 Anterior cervical decompression/diskectomy/fusion  . CARDIOVASCULAR STRESS TEST  09-30-2007     normal Adenosine study/  no ischemia /  normal LV function and wall motion , ef 82%  . COLONOSCOPY    . CYSTOSCOPY WITH RETROGRADE PYELOGRAM, URETEROSCOPY AND STENT PLACEMENT Right 05/12/2015   Procedure: CYSTOSCOPY WITH RETROGRADE PYELOGRAM, URETEROSCOPY,STONE EXTRACTION AND STENT PLACEMENT;  Surgeon: Franchot Gallo, MD;  Location: [facility];  Service: Urology;  Laterality: Right;  . CYSTOSCOPY WITH RETROGRADE PYELOGRAM, URETEROSCOPY AND STENT PLACEMENT Right 04/25/2018   Procedure: CYSTOSCOPY WITH RETROGRADE PYELOGRAM, URETEROSCOPY AND STENT EXCHANGE;  Surgeon: Alexis Frock, MD;  Location: [facility];  Service: Urology;  Laterality: Right;  . CYSTOSCOPY WITH STENT PLACEMENT Right 03/28/2018   Procedure: CYSTOSCOPY WITH STENT PLACEMENT retrograde pylegram;  Surgeon: Alexis Frock, MD;  Location: [facility];  Service: Urology;  Laterality: Right;  . EXTRACORPOREAL SHOCK WAVE LITHOTRIPSY Right 08-18-2012  . HOLMIUM LASER APPLICATION Right 2/35/5732   Procedure: HOLMIUM LASER APPLICATION;  Surgeon: Franchot Gallo, MD;  Location[facility];  Service: Urology;  Laterality: Right;  . HOLMIUM LASER APPLICATION Right 12/14/5425   Procedure: HOLMIUM LASER APPLICATION;  Surgeon: Alexis Frock, MD;  Location: [facility];  Service: Urology;  Laterality: Right;  . ORIF HUMERUS FRACTURE Right 07/20/2016   Procedure: OPEN REDUCTION INTERNAL FIXATION (ORIF) PROXIMAL HUMERUS FRACTURE VS REVERSE TOTAL SHOULDER ARTHROPLASTY;  Surgeon: Netta Cedars, MD;  Location: [facility];  Service: Orthopedics;  Laterality: Right;  . SHOULDER ARTHROSCOPY Right 07/2016  . TRANSTHORACIC ECHOCARDIOGRAM  09-23-2007   pseudonormal LV filling pattern,  ef 65-70%/  trivial AR/  mild LAE  . TUBAL LIGATION   1985     OB History   None      Home Medications    Prior to Admission medications   Medication Sig Start Date End Date Taking? Authorizing Provider  acetaminophen (TYLENOL) 325 MG tablet Take 650 mg by mouth 2 (two) times daily.     [provider]  aspirin 81 MG chewable tablet Chew 81 mg by mouth daily.    [provider]  azelastine (ASTELIN) 0.1 % nasal spray Place 1 spray into both nostrils 2 (two) times daily.  12/23/14   [provider]  carbidopa-levodopa (SINEMET IR) 25-100 MG tablet 2 tablets TID 02/07/18   Tat, Eustace Quail, DO  cefdinir (OMNICEF) 300 MG capsule Take 1 capsule (300 mg total) by mouth 2 (two) times daily. To prevent infection with stent in place. 04/25/18   Alexis Frock, MD  Cholecalciferol (VITAMIN D PO) Take 5,000 Units by mouth daily.    [provider]  clotrimazole (LOTRIMIN) 1 % cream Apply 1 application topically 2 (two) times daily.    [provider]  cyanocobalamin 500 MCG tablet Take 500 mcg by mouth  daily.    [provider]  escitalopram (LEXAPRO) 10 MG tablet Take 15 mg at bedtime by mouth.  06/08/16   [provider]  fenofibrate 160 MG tablet Take 160 mg by mouth every morning.     [provider]  fluticasone (FLONASE) 50 MCG/ACT nasal spray Place 2 sprays into both nostrils daily.  12/23/14   [provider]  furosemide (LASIX) 20 MG tablet Take 1 tablet (20 mg total) by mouth daily. 04/14/18 04/14/19  Lavina Hamman, MD  furosemide (LASIX) 40 MG tablet Take 1 tablet (40 mg total) by mouth daily for 5 days. Patient not taking: Reported on 04/18/2018 04/09/18 04/14/18  Lavina Hamman, MD  insulin aspart (NOVOLOG) 100 UNIT/ML injection Inject 3-5 Units into the skin 3 (three) times daily before meals.    [provider]  loperamide (IMODIUM) 2 MG capsule Take 1 capsule (2 mg total) by mouth as needed for diarrhea or loose stools. Patient not taking: Reported on  04/25/2018 04/08/18   Lavina Hamman, MD  loratadine (CLARITIN) 10 MG tablet Take 10 mg by mouth. Taking every other day.    [provider]  Melatonin 5 MG CAPS Take 5 mg by mouth at bedtime as needed.     [provider]  metFORMIN (GLUCOPHAGE) 500 MG tablet Take 250 mg by mouth 2 (two) times daily with a meal.     [provider]  montelukast (SINGULAIR) 10 MG tablet Take 10 mg by mouth at bedtime.    [provider]  nystatin cream (MYCOSTATIN) Apply 1 application topically daily. 05/25/18   [provider]  olmesartan (BENICAR) 40 MG tablet Take 40 mg by mouth daily. 05/26/18   [provider]  potassium chloride SA (K-DUR,KLOR-CON) 20 MEQ tablet Take 1 tablet (20 mEq total) by mouth 2 (two) times daily at 10 AM and 5 PM for 6 days. Patient not taking: Reported on 04/18/2018 04/08/18 04/14/18  Lavina Hamman, MD  Propylene Glycol (SYSTANE BALANCE OP) Place 2 drops into both eyes 2 (two) times daily as needed (dry eyes).     [provider]  QUEtiapine (SEROQUEL) 50 MG tablet Take 50 mg by mouth at bedtime.    [provider]  ranitidine (ZANTAC) 150 MG tablet Take 150 mg by mouth 2 (two) times daily.    [provider]  traMADol (ULTRAM) 50 MG tablet Take 1 tablet (50 mg total) by mouth every 6 (six) hours as needed for moderate pain or severe pain. Post-operatively 04/25/18 04/25/19  Alexis Frock, MD    Family History Family History  Problem Relation Age of Onset  . Heart Problems Father   . Stroke Mother   . Hypertension Unknown   . Stroke Unknown   . Breast cancer Sister     Social History Social History   Tobacco Use  . Smoking status: Never Smoker  . Smokeless tobacco: Never Used  Substance Use Topics  . Alcohol use: Yes    Alcohol/week: 0.0 oz    Comment: 3- 4 times a year  . Drug use: No     Allergies   Aleve [naproxen]; Protonix [pantoprazole sodium]; and Zocor [simvastatin]   Review of  Systems Review of Systems All other systems negative except as documented in the HPI. All pertinent positives and negatives as reviewed in the HPI.  Physical Exam Updated Vital Signs BP 128/75   Pulse 95   Temp 99 F (37.2 C) (Oral)   Resp Marland Kitchen)  22   SpO2 96%   Physical Exam  Constitutional: She is oriented to person, place, and time. She appears well-developed and well-nourished. No distress.  HENT:  Head: Normocephalic and atraumatic.    Mouth/Throat: Oropharynx is clear and moist.  Eyes: Pupils are equal, round, and reactive to light.  Neck: Normal range of motion. Neck supple.  Cardiovascular: Normal rate, regular rhythm and normal heart sounds. Exam reveals no gallop and no friction rub.  No murmur heard. Pulmonary/Chest: Effort normal and breath sounds normal. No respiratory distress. She has no wheezes.  Abdominal: Soft. Bowel sounds are normal. She exhibits no distension. There is no tenderness.  Musculoskeletal:       Right wrist: She exhibits tenderness. She exhibits normal range of motion, no bony tenderness, no swelling and no effusion.       Right knee: Normal. She exhibits normal range of motion, no swelling and no effusion.       Legs: Neurological: She is alert and oriented to person, place, and time. She exhibits normal muscle tone. Coordination normal.  Skin: Skin is warm and dry. Capillary refill takes less than 2 seconds. No rash noted. No erythema.  Psychiatric: She has a normal mood and affect. Her behavior is normal.  Nursing note and vitals reviewed.    ED Treatments / Results  Labs (all labs ordered are listed, but only abnormal results are displayed) Labs Reviewed - No data to display  EKG None  Radiology Dg Wrist Complete Right  Result Date: 05/27/2018 CLINICAL DATA:  Right wrist pain since the patient fell today when she lost her balance. Initial encounter. EXAM: RIGHT WRIST - COMPLETE 3+ VIEW COMPARISON:  Plain films right hand 02/15/2017.  FINDINGS: There is no evidence of fracture or dislocation. There is no evidence of arthropathy or other focal bone abnormality. Soft tissues are unremarkable. IMPRESSION: Negative exam. Electronically Signed   By: Inge Rise M.D.   On: 05/27/2018 14:15   Ct Head Wo Contrast  Result Date: 05/27/2018 CLINICAL DATA:  Patient status post fall today while walking. Laceration on the right side of the forehead. Initial encounter. EXAM: CT HEAD WITHOUT CONTRAST CT CERVICAL SPINE WITHOUT CONTRAST TECHNIQUE: Multidetector CT imaging of the head and cervical spine was performed following the standard protocol without intravenous contrast. Multiplanar CT image reconstructions of the cervical spine were also generated. COMPARISON:  Head and cervical spine CT scan 11/22/2017. FINDINGS: CT HEAD FINDINGS Brain: No evidence of acute infarction, hemorrhage, hydrocephalus, extra-axial collection or mass lesion/mass effect. Mild atrophy and chronic microvascular ischemic change noted. Vascular: Atherosclerosis is identified. Skull: Negative. Sinuses/Orbits: Negative. Other: Laceration over the right side of the forehead without underlying foreign body is noted. CT CERVICAL SPINE FINDINGS Alignment: Straightening of lordosis is unchanged. Skull base and vertebrae: No acute fracture. No primary bone lesion or focal pathologic process. The patient is status post C4-7 ACDF. The levels are solidly fused. Soft tissues and spinal canal: No prevertebral fluid or swelling. No visible canal hematoma. Disc levels: Residual endplate spurring at Q9-1 and C6-7 is unchanged. Scattered facet degenerative disease appears worst at C2-3 on the right. Upper chest: Lung apices clear. Other: None. IMPRESSION: Laceration on the right side of the forehead without underlying fracture or foreign body. No other acute abnormality head or cervical spine. Mild atrophy and chronic microvascular ischemic change. Atherosclerosis. Status post C4-7 fusion.  Electronically Signed   By: Inge Rise M.D.   On: 05/27/2018 14:38   Ct Cervical Spine Wo Contrast  Result Date: 05/27/2018 CLINICAL DATA:  Patient status post fall today while walking. Laceration on the right side of the forehead. Initial encounter. EXAM: CT HEAD WITHOUT CONTRAST CT CERVICAL SPINE WITHOUT CONTRAST TECHNIQUE: Multidetector CT imaging of the head and cervical spine was performed following the standard protocol without intravenous contrast. Multiplanar CT image reconstructions of the cervical spine were also generated. COMPARISON:  Head and cervical spine CT scan 11/22/2017. FINDINGS: CT HEAD FINDINGS Brain: No evidence of acute infarction, hemorrhage, hydrocephalus, extra-axial collection or mass lesion/mass effect. Mild atrophy and chronic microvascular ischemic change noted. Vascular: Atherosclerosis is identified. Skull: Negative. Sinuses/Orbits: Negative. Other: Laceration over the right side of the forehead without underlying foreign body is noted. CT CERVICAL SPINE FINDINGS Alignment: Straightening of lordosis is unchanged. Skull base and vertebrae: No acute fracture. No primary bone lesion or focal pathologic process. The patient is status post C4-7 ACDF. The levels are solidly fused. Soft tissues and spinal canal: No prevertebral fluid or swelling. No visible canal hematoma. Disc levels: Residual endplate spurring at U2-0 and C6-7 is unchanged. Scattered facet degenerative disease appears worst at C2-3 on the right. Upper chest: Lung apices clear. Other: None. IMPRESSION: Laceration on the right side of the forehead without underlying fracture or foreign body. No other acute abnormality head or cervical spine. Mild atrophy and chronic microvascular ischemic change. Atherosclerosis. Status post C4-7 fusion. Electronically Signed   By: Inge Rise M.D.   On: 05/27/2018 14:38   Dg Knee Complete 4 Views Right  Result Date: 05/27/2018 CLINICAL DATA:  Lost balance and fell today,  anterior RIGHT knee pain post fall, redness, bruising, initial encounter EXAM: RIGHT KNEE - COMPLETE 4+ VIEW COMPARISON:  12/13/2017 FINDINGS: Bones appear demineralized. Mild joint space narrowing at lateral compartment. Prepatellar soft tissue swelling. No acute fracture, dislocation, or bone destruction. No knee joint effusion. IMPRESSION: No acute osseous abnormalities. Mild degenerative changes RIGHT knee. Electronically Signed   By: Lavonia Dana M.D.   On: 05/27/2018 14:16    Procedures Procedures (including critical care time)  Medications Ordered in ED Medications  lidocaine (XYLOCAINE) 2 % (with pres) injection 400 mg (has no administration in time range)     Initial Impression / Assessment and Plan / ED Course  I have reviewed the triage vital signs and the nursing notes.  Pertinent labs & imaging results that were available during my care of the patient were reviewed by me and considered in my medical decision making (see chart for details).    LACERATION REPAIR Performed by: Brent General Authorized by: Resa Miner Collins [place] Consent: Verbal consent obtained. Risks and benefits: risks, benefits and alternatives were discussed Consent given by: patient Patient identity confirmed: provided demographic data Prepped and Draped in normal sterile fashion Wound explored  Laceration Location: Right superior forehead  Laceration Length: 5 cm  No Foreign Bodies seen or palpated  Anesthesia: local infiltration  Local anesthetic: lidocaine 2 % without epinephrine  Anesthetic total: 7 ml  Irrigation method: syringe Amount of cleaning: standard  Skin closure: 6-0 Prolene  Number of sutures: 12  Technique: Simple interrupted  Patient tolerance: Patient tolerated the procedure well with no immediate complications.   Patient has been stable here in the emergency department and she will be discharged home advised to have the sutures out in 5 days.  Told to return  here as needed.  CT scans and x-rays were reviewed and they show no fractures or signs of acute injury.   Final Clinical Impressions(s) /  ED Diagnoses   Final diagnoses:  None    ED Discharge Orders    None       Dalia Heading, PA-C 05/27/18 1631    Drenda Freeze, MD 05/29/18 1736

## 2018-05-29 DIAGNOSIS — A419 Sepsis, unspecified organism: Secondary | ICD-10-CM | POA: Diagnosis not present

## 2018-05-29 DIAGNOSIS — E119 Type 2 diabetes mellitus without complications: Secondary | ICD-10-CM | POA: Diagnosis not present

## 2018-05-29 DIAGNOSIS — N136 Pyonephrosis: Secondary | ICD-10-CM | POA: Diagnosis not present

## 2018-05-29 DIAGNOSIS — M5416 Radiculopathy, lumbar region: Secondary | ICD-10-CM | POA: Diagnosis not present

## 2018-05-29 DIAGNOSIS — I5031 Acute diastolic (congestive) heart failure: Secondary | ICD-10-CM | POA: Diagnosis not present

## 2018-06-02 DIAGNOSIS — Z4802 Encounter for removal of sutures: Secondary | ICD-10-CM | POA: Diagnosis not present

## 2018-06-02 DIAGNOSIS — Z6835 Body mass index (BMI) 35.0-35.9, adult: Secondary | ICD-10-CM | POA: Diagnosis not present

## 2018-06-03 ENCOUNTER — Telehealth: Payer: Self-pay | Admitting: Psychology

## 2018-06-03 NOTE — Telephone Encounter (Signed)
TC to patient as she has not been at the past two AP support groups. It appears that patient was in the ED due to a fall and is back in her independent living facility instead of the assisted level of care. I will attempt to check in with the patient's family to see if they need further assistance with getting the patient the most appropriate level of care to reduce risks of falls and injury.

## 2018-06-04 DIAGNOSIS — S0181XD Laceration without foreign body of other part of head, subsequent encounter: Secondary | ICD-10-CM | POA: Diagnosis not present

## 2018-06-04 DIAGNOSIS — Z6841 Body Mass Index (BMI) 40.0 and over, adult: Secondary | ICD-10-CM | POA: Diagnosis not present

## 2018-06-09 DIAGNOSIS — I10 Essential (primary) hypertension: Secondary | ICD-10-CM | POA: Diagnosis not present

## 2018-06-09 DIAGNOSIS — G4700 Insomnia, unspecified: Secondary | ICD-10-CM | POA: Diagnosis not present

## 2018-06-09 DIAGNOSIS — Z6835 Body mass index (BMI) 35.0-35.9, adult: Secondary | ICD-10-CM | POA: Diagnosis not present

## 2018-06-09 DIAGNOSIS — Z23 Encounter for immunization: Secondary | ICD-10-CM | POA: Diagnosis not present

## 2018-06-09 DIAGNOSIS — Z0000 Encounter for general adult medical examination without abnormal findings: Secondary | ICD-10-CM | POA: Diagnosis not present

## 2018-06-11 DIAGNOSIS — A419 Sepsis, unspecified organism: Secondary | ICD-10-CM | POA: Diagnosis not present

## 2018-06-11 DIAGNOSIS — I5031 Acute diastolic (congestive) heart failure: Secondary | ICD-10-CM | POA: Diagnosis not present

## 2018-06-11 DIAGNOSIS — M5416 Radiculopathy, lumbar region: Secondary | ICD-10-CM | POA: Diagnosis not present

## 2018-06-11 DIAGNOSIS — E119 Type 2 diabetes mellitus without complications: Secondary | ICD-10-CM | POA: Diagnosis not present

## 2018-06-11 DIAGNOSIS — N136 Pyonephrosis: Secondary | ICD-10-CM | POA: Diagnosis not present

## 2018-06-17 DIAGNOSIS — E1140 Type 2 diabetes mellitus with diabetic neuropathy, unspecified: Secondary | ICD-10-CM | POA: Diagnosis not present

## 2018-06-18 DIAGNOSIS — E119 Type 2 diabetes mellitus without complications: Secondary | ICD-10-CM | POA: Diagnosis not present

## 2018-06-18 DIAGNOSIS — A419 Sepsis, unspecified organism: Secondary | ICD-10-CM | POA: Diagnosis not present

## 2018-06-18 DIAGNOSIS — M5416 Radiculopathy, lumbar region: Secondary | ICD-10-CM | POA: Diagnosis not present

## 2018-06-18 DIAGNOSIS — N136 Pyonephrosis: Secondary | ICD-10-CM | POA: Diagnosis not present

## 2018-06-18 DIAGNOSIS — I5031 Acute diastolic (congestive) heart failure: Secondary | ICD-10-CM | POA: Diagnosis not present

## 2018-06-19 DIAGNOSIS — Z6835 Body mass index (BMI) 35.0-35.9, adult: Secondary | ICD-10-CM | POA: Diagnosis not present

## 2018-06-19 DIAGNOSIS — E1140 Type 2 diabetes mellitus with diabetic neuropathy, unspecified: Secondary | ICD-10-CM | POA: Diagnosis not present

## 2018-06-19 DIAGNOSIS — I10 Essential (primary) hypertension: Secondary | ICD-10-CM | POA: Diagnosis not present

## 2018-07-04 ENCOUNTER — Encounter: Payer: Self-pay | Admitting: Psychology

## 2018-07-04 DIAGNOSIS — G4733 Obstructive sleep apnea (adult) (pediatric): Secondary | ICD-10-CM | POA: Diagnosis not present

## 2018-07-04 NOTE — Progress Notes (Signed)
Patient came to the atypical parkinsonian support group early so I met with her and her friend, [name], and she provided some updates on how she is doing.  She reports that she is still living at the [facility] in [facility].  She did not like living at [facility].   At this time is not seeking a higher level of care.  It was described that she has almost 24/7 care.  Her friend, [name], assists her during the morning and days and she has a private duty CNA that is with her at night.  It sounds like there is some discord with family members who were trying to get her into a higher level of care.  The patient would not elaborate too much on that and the setting was not appropriate to continue to ask about.  Her friend [name] has been taking her to the movies and getting her out of her home some which is been helpful for her quality of life.

## 2018-07-07 DIAGNOSIS — F339 Major depressive disorder, recurrent, unspecified: Secondary | ICD-10-CM | POA: Diagnosis not present

## 2018-07-07 DIAGNOSIS — G4700 Insomnia, unspecified: Secondary | ICD-10-CM | POA: Diagnosis not present

## 2018-07-07 DIAGNOSIS — Z23 Encounter for immunization: Secondary | ICD-10-CM | POA: Diagnosis not present

## 2018-07-07 DIAGNOSIS — I10 Essential (primary) hypertension: Secondary | ICD-10-CM | POA: Diagnosis not present

## 2018-07-07 DIAGNOSIS — Z6836 Body mass index (BMI) 36.0-36.9, adult: Secondary | ICD-10-CM | POA: Diagnosis not present

## 2018-07-07 NOTE — Progress Notes (Signed)
[name] [name] was seen today in the movement disorders clinic for neurologic consultation at the request of [name], MD.    The patient presents for a second opinion regarding falls and balance difficulties.  She has been seeing Dr. [name] since January, 2015 and most recently saw him on 11/25/2015 and saw his nurse practitioner on 12/14/2015.  I have taken a detailed review of the records from [facility] neurology.  Her initial complaint in January, 2015 was of dizziness and balance change.  By November, 2015, the patient's balance change and falls had become such a problem that Dr. [name] told the patient that he thought it was unsafe for her to live alone.  He noted that the patient was very short stepped in her gait and she was off balance and antalgic.  Over the course of time, she continued to have repetitive falls.  As of August, 2016 the patient began to complain of drooling.  In November, 2016, the patient began to complain of tremor when holding a cup, which she states has increased.  01/26/16 update:  The patient presents today for follow-up.  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  She has continued to have falls and fell on February 23 ended up in the emergency room.  They did not do a CT of the brain.  Fortunately, she did not lose consciousness with that fall.  She did fall 3 times the day after that but none since.  She has had some dizziness with the medication.  She did have labwork at our last visit.  Her zinc was normal.  Vitamin D was normal.  Celiac panel was normal.  RPR was negative.  Vitamin B12 was slightly low at 313 and I asked her to start a B12 supplement.  Folate was normal.  The patient wanted to follow-up here a little bit earlier.  States that the diplopia is getting worse.  She had an MRI of the cervical spine on 01/16/16 that I reviewed.  There is degenerative changes and significant NFS and  C5 on the L, bilaterally at C6 and 7.  03/13/16 update:  The patient presents today for follow-up.  She is accompanied by her in-home caregiver who supplements the history.  Her caregiver now comes a few hours to days a week.  I have reviewed records since last visit.  The patient saw Dr. [name] on 02/06/2016 and his notes stated that there was no doubt that the patient had cervical stenosis but he was not sure how much the surgery would help.  He told the patient to follow back up with me and my recommendations.  I subsequently got a call from the patient's primary care physician and she asked me the same question and I stated that while the surgery may help her pain, it was not going to help her falls, shuffling, or diplopia and the anesthesia could actually set the [place] back.  Last visit, I did increase her carbidopa/levodopa 25/100-2 tablets 3 times per day.  I offered to change her to the extended release to see if that would help the dizziness but she did not want to do that.  She eventually called me and stated that she was dizzy and stated that her primary care physician said that she may need discontinue the medication.  I asked her if she was sure that it was from the medication because dizziness was one of her  presenting complaints at onset in 2015.  I did tell her that she could hold the medication and see if the dizziness changed.  The patient states that she d/c the medication.  She states that she has fallen 4 times, 2 prior to the d/c of levodopa and 2 after.  She really noticed no change in the dizziness and in fact states that she is more dizzy now.  The patient did see Dr. [name] on April 20 in follow-up for her sleep apnea.  She states that she does not have to see Dr. [name] for another year and it was just recommended that she use her CPAP more and try melatonin.  06/15/16 update:  The patient presents today for follow-up.  This patient is accompanied in the office by her caregiver who supplements  the history.  She has a caregiver 2 days a week.   I have reviewed multiple records made available to me.  The patient had anterior cervical discectomy at the C4-C5, C5-C6, and C6-C7 levels on May 12.  She was discharged from the hospital on May 14.  She had no surgical complications.  She is on carbidopa/levodopa 25/100, 2 tablets in the morning, 2 in the afternoon and one in the evening.  She has had falls since surgery, the last of which was 7/26.  She was bent over to get dish washing detergent and fell on her knees.  She also fell on 7/5 and she tried to get up from the chair and her foot got caught.  She fell on 6/15 and she bruised her arm.  With the falls, the walker was present but she wasn't necessarily leaning on it at the time.  She is in PT right now 2-3 days a week.    10/16/16 update:  Pt presents for follow up.  She is accompanied by her caregiver and sister in law who supplement the history.She has MSA-C.  She is on carbidopa/levodopa 25/100, 2/2/1.  Asked me about driving last visit and I was leery because of diplopia so told her that she needed an ophthalmology evaluation.  She did go on 09/11/16 and they sent me handwritten notes but driving doesn't appear to have been addressed. She is supposed to get new glasses today.  She has not been driving much lately because of recent surgery, but admits that she did recently get released by her surgeon to drive and took a "test drive."  The records that were made available to me were reviewed.  Fell in August and sustained a humerus fx.  At that time, I recommend she stop walking all together and remain in a wheelchair at all times.  This was told to patient and her son (on the phone).  She underwent ORIF of the fx and a reverse total shoulder on 07/20/2016.  She had 24 hour/day care until this week and it was cut back on this week.  She admits that she still has been walking.  Getting choked on green tea/liquids.  "I'm having more laughter than I  should."  12/10/16 update:  Patient presents today for mobility evaluation.  She is accompanied by her caregiver.  A representative from advanced home care is also present.  The patient remains on carbidopa/levodopa 25/100, 2/2/1.  She did have a swallow study on 10/26/2016 that demonstrated mild oropharyngeal dysphagia, but no change the dietary recommendations were made at this time.  Regular diet with thin liquids was recommended.  Last visit and several times since, I talked  to her about the fact that I do not want her living alone and that I want her using a wheelchair at all times.  She has not been doing that and actually walked into the office today.  She states that she fell on jan 20 while trying to clean up something on the floor.  Had EMS eval her that day.  No LOC and no change in MS since then, which her caregiver agrees with.  She is moving this weekend to [facility] in [facility].  Her meds will be distributed.  She will get bathing assist.   05/23/17 update:  Patient presents for follow-up, accompanied by her sister-in-law and brother who supplements the history.  She is on carbidopa/levodopa 25/100, 2 tablets in the morning, 2 in the afternoon and one in the evening.  We did a mobility evaluation last visit and talked about the importance of staying seated at all times.  Unfortunately, she has not done that.  She presented to the hospital after a fall on 02/15/2017.  She did not hit her head.  I did get a note from Dr. Valetta Close dated 01/28/2017.  She saw him for worsening diplopia.  He recommended prisms.  They didn't help and she is supposed to see neuroopthalmology.   She moved again from [place] and is no longer in assisted living but is in retired living.  She has already had rib fx in her new place.  She has sitters at night and in the AM.  She has assistance with bathing and dressing and morning meals.    09/24/17 update: Patient seen today in follow-up for her MSA.  She is accompanied by  her sister in law who supplements the history.  I increased her carbidopa/levodopa 25/100 since her last visit so that she is now taking 2 tablets 3 times per day.  She has called a few times since last visit because of falls.  She was told that she should not be walking at all.  She is now in a motorized wheelchair, but was having difficulty with transfers and falling during the transfers.  She was told that she should not be alone for transfers.  Fortunately, she has not had any serious injuries.  She just had a recent fall.  She still has no one to help her with daytime toileting.  She is having trouble with swallowing and more coughing with food.    02/18/18 update: Patient is seen today in follow-up.  She is accompanied by her sister-in-law and brother who supplements the history.  She is on carbidopa/levodopa 25/100, 2 tablets 3 times per day.  She and her family have attended the new atypical support group.  Records have been reviewed since our last visit.  Modified barium swallow was completed on October 09, 2017 and demonstrated mild pharyngeal dysphasia.  Regular diet with thin liquids was recommended.  A GI evaluation was recommended for esophageal assessment.  She saw Dr. Halford Chessman on October 09, 2017.  Chest x-ray was ordered and that was negative.  She had pulmonary function testing in January, 2019 that was unremarkable.  She is on Flonase and Astelin and as needed Claritin.  She followed up with [facility] neurology on October 01, 2018 in regards to her sleep apnea.  CPAP download demonstrated suboptimal compliance.  She has had multiple falls since her last visit.  Reiterated many times the importance of not walking.  She has discussed with our social worker resources for higher level of care.  Having neck pain and was to have an MRI tomorrow and was considering surgery.  Her family asks about this today.  Having significant diplopia as well.  07/08/18 update: Patient is seen today in follow-up for  MSA.  She is accompanied by her friend [name] who supplements the history.  Patient is on carbidopa/levodopa 25/100, 2 tablets 3 times per day.  Records are reviewed since last visit.  Patient was in the hospital from May 17 to Apr 08, 2018.  She was found by family with mental status change.  Patient was septic due to urosepsis/pyelonephritis/urinary tract infection.  Patient required mechanical ventilation.  She recovered and went to subacute nursing facility for rehab.  She came back to the hospital on June 14 for cystoscopy and lithotripsy.  Son called since last visit about concerns about care in the subacute nursing facility, but also many questions about her disease and lifespan.  While he did talk with my Administrator, sports, it was encouraged for him to come to the appointment as we have discussed these issues with her extensively at many appointments.  She is back in the emergency room on July 16 after a fall requiring suture placement to the right forehead.  She is currently not at the subacute nursing facility.  She is in independent living.  She reports that her friend, [name], is with her most of the time during the day and a CNA is with her at night (7pm-7am).   Reports that most of the time she isn't walking "much" but is some.  Aides bathe her 3 times per week.  Swallowing is "ok" and friend [name] hasn't noted choking but she does cough a lot.  It is worse with eating.  Neck pain is worse and asks me about botox.   ALLERGIES:   Allergies  Allergen Reactions  . Aleve [Naproxen] Hives, Shortness Of Breath and Rash    Pt Avoids All Nsaids  . Protonix [Pantoprazole Sodium]     Makes her feel wreidd  . Zocor [Simvastatin] Other (See Comments)    "weird feeling all over body"    CURRENT MEDICATIONS:  Outpatient Encounter Medications as of 07/08/2018  Medication Sig  . acetaminophen (TYLENOL) 325 MG tablet Take 650 mg by mouth 2 (two) times daily.   Marland Kitchen aspirin 81 MG chewable  tablet Chew 81 mg by mouth daily.  Marland Kitchen azelastine (ASTELIN) 0.1 % nasal spray Place 1 spray into both nostrils 2 (two) times daily.   . carbidopa-levodopa (SINEMET IR) 25-100 MG tablet 2 tablets TID  . Cholecalciferol (VITAMIN D PO) Take 5,000 Units by mouth daily.  . cyanocobalamin 500 MCG tablet Take 500 mcg by mouth daily.  Marland Kitchen escitalopram (LEXAPRO) 10 MG tablet Take 10 mg by mouth at bedtime.   . fenofibrate 160 MG tablet Take 160 mg by mouth every morning.   . fluticasone (FLONASE) 50 MCG/ACT nasal spray Place 2 sprays into both nostrils daily.   Marland Kitchen loratadine (CLARITIN) 10 MG tablet Take 10 mg by mouth. Taking every other day.  . Melatonin 5 MG CAPS Take 10 mg by mouth at bedtime as needed.   . Menthol, Topical Analgesic, (BIOFREEZE EX) Apply topically.  . montelukast (SINGULAIR) 10 MG tablet Take 10 mg by mouth at bedtime.  Marland Kitchen nystatin cream (MYCOSTATIN) Apply 1 application topically daily.  Marland Kitchen olmesartan (BENICAR) 40 MG tablet Take 40 mg by mouth daily.  . traMADol (ULTRAM) 50 MG tablet Take 1 tablet (50 mg total) by mouth  every 6 (six) hours as needed for moderate pain or severe pain. Post-operatively  . [DISCONTINUED] cefdinir (OMNICEF) 300 MG capsule Take 1 capsule (300 mg total) by mouth 2 (two) times daily. To prevent infection with stent in place.  . [DISCONTINUED] clotrimazole (LOTRIMIN) 1 % cream Apply 1 application topically 2 (two) times daily.  . [DISCONTINUED] furosemide (LASIX) 20 MG tablet Take 1 tablet (20 mg total) by mouth daily.  . [DISCONTINUED] furosemide (LASIX) 40 MG tablet Take 1 tablet (40 mg total) by mouth daily for 5 days. (Patient not taking: Reported on 04/18/2018)  . [DISCONTINUED] insulin aspart (NOVOLOG) 100 UNIT/ML injection Inject 3-5 Units into the skin 3 (three) times daily before meals.  . [DISCONTINUED] loperamide (IMODIUM) 2 MG capsule Take 1 capsule (2 mg total) by mouth as needed for diarrhea or loose stools. (Patient not taking: Reported on 04/25/2018)  .  [DISCONTINUED] metFORMIN (GLUCOPHAGE) 500 MG tablet Take 250 mg by mouth 2 (two) times daily with a meal.   . [DISCONTINUED] potassium chloride SA (K-DUR,KLOR-CON) 20 MEQ tablet Take 1 tablet (20 mEq total) by mouth 2 (two) times daily at 10 AM and 5 PM for 6 days. (Patient not taking: Reported on 04/18/2018)  . [DISCONTINUED] Propylene Glycol (SYSTANE BALANCE OP) Place 2 drops into both eyes 2 (two) times daily as needed (dry eyes).   . [DISCONTINUED] QUEtiapine (SEROQUEL) 50 MG tablet Take 50 mg by mouth at bedtime.  . [DISCONTINUED] ranitidine (ZANTAC) 150 MG tablet Take 150 mg by mouth 2 (two) times daily.   No facility-administered encounter medications on file as of 07/08/2018.     PAST MEDICAL HISTORY:   Past Medical History:  Diagnosis Date  . Acute kidney failure (HCC)    hx of   . Arthritis    fingers,right shoulder  . Bradycardia   . 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 CHF (congestive heart failure) (HCC)    acute diastolic ( congestive ) heart failure)   . Chronic lumbar radiculopathy    L5- S1  right leg  . Constipation   . Depression   . Diverticulosis large intestine w/o perforation or abscess w/bleeding   . Dizziness   . Dry eye syndrome of bilateral lacrimal glands   . Frequency of urination   . GERD (gastroesophageal reflux disease)   . Hard of hearing   . History of kidney stones 05/12/15   surgery   . HTN (hypertension)   . Hyperlipidemia   . Hypokalemia   . Morbid obesity due to excess calories ([place])   . 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 Numbness and tingling in hands   . OSA (obstructive sleep apnea)    moderate OSA per study 11-22-2007--  refused CPAP but used oxygen for 6 months at night,  states due to wt loss stopped using oxygen  . Polyneuropathy, diabetic ([place])    WALKS W/ CANE FOR BALANCE  . 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 Progressive supranuclear palsy  ([place])   . Pyonephrosis   . Pyonephrosis   . Radiculopathy of lumbar region   . Respiratory failure (HCC)    hx of acute respiratory failure wtih hypoxia   . Right ureteral stone   . Severe sepsis with septic shock (CODE) ([place])   . Shortness of breath dyspnea   . Spinal stenosis, cervical region   . SUI (stress urinary incontinence,  female)   . Tubulo-interstitial nephritis   . Type 2 diabetes mellitus (HCC)    Type II - Diet controlled  . Urinary incontinence   . Urinary tract infection   . Wears glasses     PAST SURGICAL HISTORY:   Past Surgical History:  Procedure Laterality Date  . ANTERIOR CERVICAL DECOMP/DISCECTOMY FUSION N/A 03/23/2016   Procedure: Cervical Four-Five, Cervical Five-Six, Cervical Six-Seven Anterior cervical decompression/diskectomy/fusion;  Surgeon: Leeroy Cha, MD;  Location: [facility];  Service: Neurosurgery;  Laterality: N/A;  C4-5 C5-6 C6-7 Anterior cervical decompression/diskectomy/fusion  . CARDIOVASCULAR STRESS TEST  09-30-2007     normal Adenosine study/  no ischemia /  normal LV function and wall motion , ef 82%  . COLONOSCOPY    . CYSTOSCOPY WITH RETROGRADE PYELOGRAM, URETEROSCOPY AND STENT PLACEMENT Right 05/12/2015   Procedure: CYSTOSCOPY WITH RETROGRADE PYELOGRAM, URETEROSCOPY,STONE EXTRACTION AND STENT PLACEMENT;  Surgeon: Franchot Gallo, MD;  Location: [facility];  Service: Urology;  Laterality: Right;  . CYSTOSCOPY WITH RETROGRADE PYELOGRAM, URETEROSCOPY AND STENT PLACEMENT Right 04/25/2018   Procedure: CYSTOSCOPY WITH RETROGRADE PYELOGRAM, URETEROSCOPY AND STENT EXCHANGE;  Surgeon: Alexis Frock, MD;  Location: [facility];  Service: Urology;  Laterality: Right;  . CYSTOSCOPY WITH STENT PLACEMENT Right 03/28/2018   Procedure: CYSTOSCOPY WITH STENT PLACEMENT retrograde pylegram;  Surgeon: Alexis Frock, MD;  Location: [facility];  Service: Urology;  Laterality: Right;  . EXTRACORPOREAL SHOCK WAVE LITHOTRIPSY Right 08-18-2012  .  HOLMIUM LASER APPLICATION Right 8/54/6270   Procedure: HOLMIUM LASER APPLICATION;  Surgeon: Franchot Gallo, MD;  Location: [facility];  Service: Urology;  Laterality: Right;  . HOLMIUM LASER APPLICATION Right 3/50/0938   Procedure: HOLMIUM LASER APPLICATION;  Surgeon: Alexis Frock, MD;  Location: [facility];  Service: Urology;  Laterality: Right;  . ORIF HUMERUS FRACTURE Right 07/20/2016   Procedure: OPEN REDUCTION INTERNAL FIXATION (ORIF) PROXIMAL HUMERUS FRACTURE VS REVERSE TOTAL SHOULDER ARTHROPLASTY;  Surgeon: Netta Cedars, MD;  Location: [facility];  Service: Orthopedics;  Laterality: Right;  . SHOULDER ARTHROSCOPY Right 07/2016  . TRANSTHORACIC ECHOCARDIOGRAM  09-23-2007   pseudonormal LV filling pattern,  ef 65-70%/  trivial AR/  mild LAE  . TUBAL LIGATION  1985    SOCIAL HISTORY:   Social History   Socioeconomic History  . Marital status: Widowed    Spouse name: Not on file  . Number of children: 1  . Years of education: College  . Highest education level: Not on file  Occupational History  . Occupation: Therapist, sports: [employer]  . Financial resource strain: Not on file  . Food insecurity:    Worry: Not on file    Inability: Not on file  . Transportation needs:    Medical: Not on file    Non-medical: Not on file  Tobacco Use  . Smoking status: Never Smoker  . Smokeless tobacco: Never Used  Substance and Sexual Activity  . Alcohol use: Yes    Alcohol/week: 0.0 standard drinks    Comment: 3- 4 times a year  . Drug use: No  . Sexual activity: Not on file  Lifestyle  . Physical activity:    Days per week: Not on file    Minutes per session: Not on file  . Stress: Not on file  Relationships  . Social connections:    Talks on phone: Not on file    Gets together: Not on file    Attends religious service: Not on file  Active member of club or organization: Not on file    Attends meetings of clubs or organizations: Not  on file    Relationship status: Not on file  . Intimate partner violence:    Fear of current or ex partner: Not on file    Emotionally abused: Not on file    Physically abused: Not on file    Forced sexual activity: Not on file  Other Topics Concern  . Not on file  Social History Narrative   Patient lives at home with her dog name "Hot Rod".   Caffeine Use: 2-3 cups of coffee a day     FAMILY HISTORY:   Family Status  Relation Name Status  . Father  Deceased at age 57       heart disease  . Mother  Deceased at age 95       stroke  . Sister  Alive       asthma  . Sister  Deceased       breast cancer  . Sister  Deceased       breast cancer  . Unknown  (Not Specified)  . Sister  (Not Specified)    ROS:  Review of Systems  Constitutional: Negative.   HENT: Negative.   Eyes: Positive for double vision.  Respiratory: Positive for cough.   Cardiovascular: Negative.   Gastrointestinal: Negative.   Genitourinary: Negative.   Musculoskeletal: Positive for neck pain.  Skin: Negative.   Endo/Heme/Allergies: Negative.   Psychiatric/Behavioral: Negative.     PHYSICAL EXAMINATION:    VITALS:   Vitals:   07/08/18 1142  BP: 120/72  Pulse: 86  SpO2: 96%     GEN:  The patient appears stated age and is in NAD. HEENT:  Normocephalic, atraumatic.   The mucous membranes are very dry. The superficial temporal arteries are without ropiness or tenderness.  No fasciculations in tongue. CV:  RRR Lungs:  CTAB Neck/HEME:  There are no carotid bruits bilaterally.  Patient with mild cervical dystonia.  Head left, side bent right  Neurological examination:  Orientation: The patient is alert and oriented x3.  Cranial nerves: There is good facial symmetry with the exception of mild L ptosis.  She has complete upgaze and downgaze paresis.  She has difficulty with smooth pursuit.  She has square wave jerks.   She often times purposely closes the right eye to be able to see without  diplopia.  The visual fields are full to confrontational testing. The speech is fluent and significantly dysarthric.  Has pseudobulbar speech. Some trouble with the gutteral sounds.  Soft palate rises symmetrically and there is no tongue deviation. Hearing is intact to conversational tone. Sensation: Sensation is intact to light touch throughout Motor: Strength is 5/5 in the bilateral upper and lower extremities.   Shoulder shrug is equal and symmetric.  There is no pronator drift.    Movement examination: Tone: There is normal tone in the bilateral upper extremities.  The tone in the lower extremities is normal.  Abnormal movements: There is no tremor or dyskinesia today.  There is abnormal posture of the head with right head tilt and the right ear approximates the right shoulder Coordination:  There is decremation with RAM's, seen with virtually all forms of RAMs on the right, especially toe taps.   Gait and Station: The patient was not ambulated today as she is in a wheelchair and I do not want her to be ambulating any longer  ASSESSMENT/PLAN:  1.  Progressive Supranuclear Palsy  -.  Discussed in detail this diagnosis with the patient and her friend.  Told her if wanted son involved with med care, needs to come to visit  -Discussed with her her current living situation yet again. She needs 24 hour/day care.  -Continue carbidopa/levodopa 25/100, 2 tablets 3 times per day.  -Many, many discussions about the fact that she should not be walking and needs to stay seated in a wheelchair or transport chair at all times.  She has had multiple falls with fairly significant consequences.  -discussed her living will.  States that she is DNR but was just in the hospital on vent.  Told her to make sure that living will aligns with her wishes and to bring me a copy 2.  Cervical dystonia  -may be source of some of neck pain.  Wants to proceed with botox 3.  Dysphagia and cough  -Modified barium swallow was  completed on October 09, 2017 and demonstrated mild pharyngeal dysphasia.  Regular diet with thin liquids was recommended.   4.  Mild B12 deficiency  -is on B12 supplement 1031mcg daily 5.  Constipation  -given rancho recipe at previous visits and is managing this well for now 6.  Pseudobulbar laughter  -discussed neudexta and she wishes to hold on this for now. 7.  Obstructive sleep apnea syndrome  -Following with [facility] neurology.  Not using now as left mask at hospital.  Told her to f/u with home care company and [facility] 8.  Follow up is anticipated in the next few months, sooner should new neurologic issues arise.  Much greater than 50% of this visit was spent in counseling and coordinating care.  Total face to face time:  25 min

## 2018-07-08 ENCOUNTER — Ambulatory Visit: Payer: PPO | Admitting: Neurology

## 2018-07-08 ENCOUNTER — Encounter: Payer: Self-pay | Admitting: Neurology

## 2018-07-08 VITALS — BP 120/72 | HR 86

## 2018-07-08 DIAGNOSIS — G4733 Obstructive sleep apnea (adult) (pediatric): Secondary | ICD-10-CM

## 2018-07-08 DIAGNOSIS — G243 Spasmodic torticollis: Secondary | ICD-10-CM | POA: Diagnosis not present

## 2018-07-08 DIAGNOSIS — G231 Progressive supranuclear ophthalmoplegia [Steele-Richardson-Olszewski]: Secondary | ICD-10-CM | POA: Diagnosis not present

## 2018-07-08 MED ORDER — CARBIDOPA-LEVODOPA 25-100 MG PO TABS: ORAL_TABLET | ORAL | 1 refills | Status: DC

## 2018-07-11 DIAGNOSIS — D225 Melanocytic nevi of trunk: Secondary | ICD-10-CM | POA: Diagnosis not present

## 2018-07-11 DIAGNOSIS — Z1283 Encounter for screening for malignant neoplasm of skin: Secondary | ICD-10-CM | POA: Diagnosis not present

## 2018-07-18 ENCOUNTER — Telehealth: Payer: Self-pay | Admitting: Neurology

## 2018-07-18 NOTE — Telephone Encounter (Signed)
Please Call. She is needing to get Auth for this patient. Thanks

## 2018-07-18 NOTE — Telephone Encounter (Signed)
Spoke with [employer]. Wrong DX code on authorization. They are faxing new auth.

## 2018-07-21 ENCOUNTER — Telehealth: Payer: Self-pay | Admitting: Neurology

## 2018-07-21 NOTE — Telephone Encounter (Signed)
Left message on machine for patient to call back.  To let her know we just got approval for her Botox injections. I have her scheduled on 09/05/18 at 1:00 pm. To let her know date/time of injection. Awaiting call back.

## 2018-07-21 NOTE — Telephone Encounter (Signed)
Patient called regarding her Botox Shots. Please Call. Thanks

## 2018-07-21 NOTE — Telephone Encounter (Signed)
Patient called back and I relayed the message about her Botox appointment. She stated that date and time was great. Thank you!

## 2018-07-25 DIAGNOSIS — Z9181 History of falling: Secondary | ICD-10-CM | POA: Diagnosis not present

## 2018-07-25 DIAGNOSIS — I5031 Acute diastolic (congestive) heart failure: Secondary | ICD-10-CM | POA: Diagnosis not present

## 2018-07-25 DIAGNOSIS — M5416 Radiculopathy, lumbar region: Secondary | ICD-10-CM | POA: Diagnosis not present

## 2018-07-25 DIAGNOSIS — M4802 Spinal stenosis, cervical region: Secondary | ICD-10-CM | POA: Diagnosis not present

## 2018-07-25 DIAGNOSIS — E1165 Type 2 diabetes mellitus with hyperglycemia: Secondary | ICD-10-CM | POA: Diagnosis not present

## 2018-08-01 DIAGNOSIS — Z6835 Body mass index (BMI) 35.0-35.9, adult: Secondary | ICD-10-CM | POA: Diagnosis not present

## 2018-08-01 DIAGNOSIS — R39198 Other difficulties with micturition: Secondary | ICD-10-CM | POA: Diagnosis not present

## 2018-08-01 DIAGNOSIS — N390 Urinary tract infection, site not specified: Secondary | ICD-10-CM | POA: Diagnosis not present

## 2018-08-04 DIAGNOSIS — N390 Urinary tract infection, site not specified: Secondary | ICD-10-CM | POA: Diagnosis not present

## 2018-08-04 DIAGNOSIS — Z6834 Body mass index (BMI) 34.0-34.9, adult: Secondary | ICD-10-CM | POA: Diagnosis not present

## 2018-08-05 ENCOUNTER — Telehealth: Payer: Self-pay

## 2018-08-05 NOTE — Telephone Encounter (Signed)
VM left with patient to offer to schedule visit with Palliative Care

## 2018-08-05 NOTE — Telephone Encounter (Signed)
VM left to offer to schedule visit with Palliative Care 

## 2018-08-08 DIAGNOSIS — M5416 Radiculopathy, lumbar region: Secondary | ICD-10-CM | POA: Diagnosis not present

## 2018-08-08 DIAGNOSIS — E119 Type 2 diabetes mellitus without complications: Secondary | ICD-10-CM | POA: Diagnosis not present

## 2018-08-08 DIAGNOSIS — I110 Hypertensive heart disease with heart failure: Secondary | ICD-10-CM | POA: Diagnosis not present

## 2018-08-08 DIAGNOSIS — M4802 Spinal stenosis, cervical region: Secondary | ICD-10-CM | POA: Diagnosis not present

## 2018-08-08 DIAGNOSIS — G20 Parkinson's disease: Secondary | ICD-10-CM | POA: Diagnosis not present

## 2018-08-14 ENCOUNTER — Telehealth: Payer: Self-pay | Admitting: Psychology

## 2018-08-14 NOTE — Telephone Encounter (Signed)
TC to patient leaving a message related to noticing that palliative care is been trying to get in touch with her.  I wanted to reinforce that we have talked about palliative care as an option even as recent as the last time that I saw her.  I just wanted her to be familiar with this being the resource/service that we talked about and encouraged her to return the telephone call.  In addition, I left my contact number and she is more than welcome to give me a call if she wants more information or has questions.

## 2018-08-15 ENCOUNTER — Telehealth: Payer: Self-pay

## 2018-08-15 NOTE — Telephone Encounter (Signed)
Phone call placed to patient to offer to schedule visit with Palliative Care. Patient receptive. Visit scheduled for 08/20/18

## 2018-08-20 ENCOUNTER — Telehealth: Payer: Self-pay | Admitting: Neurology

## 2018-08-20 ENCOUNTER — Other Ambulatory Visit: Payer: PPO | Admitting: Internal Medicine

## 2018-08-20 NOTE — Telephone Encounter (Signed)
Spoke with patient. She wants to know if she can increase her Levodopa to QID. She is currently taking Carbidopa Levodopa 25/100 IR - 2 TID (at 7:30 am, 1:30 pm, and 8 pm) She is having a lot of pain in joints and tremors. Please advise.

## 2018-08-20 NOTE — Telephone Encounter (Signed)
Thank you :)

## 2018-08-20 NOTE — Progress Notes (Unsigned)
PALLIATIVE CARE CONSULT VISIT   PATIENT NAME: [name] [name] DOB: 07/16/1951 MRN: [id number]  PRIMARY CARE PROVIDER:   [name], MD  REFERRING PROVIDER:  [name], MD [address] [place], [place] [address]  RESPONSIBLE PARTY:     ASSESSMENT:        RECOMMENDATIONS and PLAN:  1.    I spent *** minutes providing this consultation,  from *** to ***. More than 50% of the time in this consultation was spent coordinating communication.   HISTORY OF PRESENT ILLNESS:  [name] [name] is a 67 y.o. year old female with multiple medical problems including ***. Palliative Care was asked to help address goals of care.   CODE STATUS: Patient changed her desire and is now a FULL CODE   PPS: 0% HOSPICE ELIGIBILITY/DIAGNOSIS: TBD  PAST MEDICAL HISTORY:  Past Medical History:  Diagnosis Date  . Acute kidney failure (HCC)    hx of   . Arthritis    fingers,right shoulder  . Bradycardia   . 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 CHF (congestive heart failure) (HCC)    acute diastolic ( congestive ) heart failure)   . Chronic lumbar radiculopathy    L5- S1  right leg  . Constipation   . Depression   . Diverticulosis large intestine w/o perforation or abscess w/bleeding   . Dizziness   . Dry eye syndrome of bilateral lacrimal glands   . Frequency of urination   . GERD (gastroesophageal reflux disease)   . Hard of hearing   . History of kidney stones 05/12/15   surgery   . HTN (hypertension)   . Hyperlipidemia   . Hypokalemia   . Morbid obesity due to excess calories ([place])   . 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 Numbness and tingling in hands   . OSA (obstructive sleep apnea)    moderate OSA per study 11-22-2007--  refused CPAP but used oxygen for 6 months at night,  states due to wt loss stopped using oxygen  . Polyneuropathy, diabetic ([place])    WALKS W/ CANE FOR BALANCE  .  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 Pyonephrosis   . Pyonephrosis   . Radiculopathy of lumbar region   . Respiratory failure (HCC)    hx of acute respiratory failure wtih hypoxia   . Right ureteral stone   . Severe sepsis with septic shock (CODE) ([place])   . Shortness of breath dyspnea   . Spinal stenosis, cervical region   . SUI (stress urinary incontinence, female)   . Tubulo-interstitial nephritis   . Type 2 diabetes mellitus (HCC)    Type II - Diet controlled  . Urinary incontinence   . Urinary tract infection   . Wears glasses     SOCIAL HX:  Social History   Tobacco Use  . Smoking status: Never Smoker  . Smokeless tobacco: Never Used  Substance Use Topics  . Alcohol use: Yes    Alcohol/week: 0.0 standard drinks    Comment: 3- 4 times a year    ALLERGIES:  Allergies  Allergen Reactions  . Aleve [Naproxen] Hives, Shortness Of Breath and Rash    Pt Avoids All Nsaids  . Protonix [Pantoprazole Sodium]     Makes her feel wreidd  . Zocor [Simvastatin] Other (See  Comments)    "weird feeling all over body"     PERTINENT MEDICATIONS:  Outpatient Encounter Medications as of 08/20/2018  Medication Sig  . acetaminophen (TYLENOL) 325 MG tablet Take 650 mg by mouth 2 (two) times daily.   Marland Kitchen aspirin 81 MG chewable tablet Chew 81 mg by mouth daily.  Marland Kitchen azelastine (ASTELIN) 0.1 % nasal spray Place 1 spray into both nostrils 2 (two) times daily.   . carbidopa-levodopa (SINEMET IR) 25-100 MG tablet 2 tablets TID  . Cholecalciferol (VITAMIN D PO) Take 5,000 Units by mouth daily.  . cyanocobalamin 500 MCG tablet Take 500 mcg by mouth daily.  Marland Kitchen escitalopram (LEXAPRO) 10 MG tablet Take 10 mg by mouth at bedtime.   . fenofibrate 160 MG tablet Take 160 mg by mouth every morning.   . fluticasone (FLONASE) 50 MCG/ACT nasal spray Place 2 sprays into both nostrils daily.   Marland Kitchen loratadine (CLARITIN) 10 MG tablet Take 10 mg by mouth. Taking  every other day.  . Melatonin 5 MG CAPS Take 10 mg by mouth at bedtime as needed.   . Menthol, Topical Analgesic, (BIOFREEZE EX) Apply topically.  . montelukast (SINGULAIR) 10 MG tablet Take 10 mg by mouth at bedtime.  Marland Kitchen nystatin cream (MYCOSTATIN) Apply 1 application topically daily.  Marland Kitchen olmesartan (BENICAR) 40 MG tablet Take 40 mg by mouth daily.  . traMADol (ULTRAM) 50 MG tablet Take 1 tablet (50 mg total) by mouth every 6 (six) hours as needed for moderate pain or severe pain. Post-operatively   No facility-administered encounter medications on file as of 08/20/2018.     PHYSICAL EXAM:   General: NAD, frail appearing, thin Cardiovascular: regular rate and rhythm Pulmonary: clear ant fields Abdomen: soft, nontender, + bowel sounds GU: no suprapubic tenderness Extremities: no edema, no joint deformities Skin: no rashes Neurological: Weakness but otherwise nonfocal  Gonzella Lex, NP

## 2018-08-21 NOTE — Telephone Encounter (Signed)
This will not help pain and don't think that this is a good idea

## 2018-08-21 NOTE — Telephone Encounter (Signed)
LMOM per DPR to let patient know that Dr. Carles Collet does not want to increase medication. She is to call back with any questions.

## 2018-09-01 DIAGNOSIS — Z6835 Body mass index (BMI) 35.0-35.9, adult: Secondary | ICD-10-CM | POA: Diagnosis not present

## 2018-09-01 DIAGNOSIS — G1220 Motor neuron disease, unspecified: Secondary | ICD-10-CM | POA: Diagnosis not present

## 2018-09-01 DIAGNOSIS — G4700 Insomnia, unspecified: Secondary | ICD-10-CM | POA: Diagnosis not present

## 2018-09-01 DIAGNOSIS — M179 Osteoarthritis of knee, unspecified: Secondary | ICD-10-CM | POA: Diagnosis not present

## 2018-09-01 DIAGNOSIS — M542 Cervicalgia: Secondary | ICD-10-CM | POA: Diagnosis not present

## 2018-09-05 ENCOUNTER — Ambulatory Visit (INDEPENDENT_AMBULATORY_CARE_PROVIDER_SITE_OTHER): Payer: PPO | Admitting: Neurology

## 2018-09-05 DIAGNOSIS — G243 Spasmodic torticollis: Secondary | ICD-10-CM

## 2018-09-05 MED ORDER — ONABOTULINUMTOXINA 100 UNITS IJ SOLR
150.0000 [IU] | Freq: Once | INTRAMUSCULAR | Status: AC
  Administered 2018-09-05: 150 [IU] via INTRAMUSCULAR

## 2018-09-05 NOTE — Procedures (Signed)
[facility]   Procedure Note Botox  Attending: Dr. Wells Guiles Abelino Tippin  Preoperative Diagnosis(es): Cervical Dystonia  Result History  Onset of effect: n/a  Duration of Benefit: n/a  Adverse Effects: n/a   Consent obtained from: The patient Benefits discussed included, but were not limited to decreased muscle tightness, increased joint range of motion, and decreased pain.  Risk discussed included, but were not limited pain and discomfort, bleeding, bruising, excessive weakness, venous thrombosis, muscle atrophy and dysphagia.  A copy of the patient medication guide was given to the patient which explains the blackbox warning.  Patients identity and treatment sites confirmed Yes.  .  Details of Procedure: Skin was cleaned with alcohol.  A 30 gauge, 72mm  needle was introduced to the target muscle, except for posterior splenius where 27 gauge, 1.5 inch needle used.   Prior to injection, the needle plunger was aspirated to make sure the needle was not within a blood vessel.  There was no blood retrieved on aspiration.    Following is a summary of the muscles injected  And the amount of Botulinum toxin used:   Dilution 0.9% preservative free saline mixed with 100 u Botox type A to make 10 U per 0.1cc  Injections  Location Left  Right Units Number of sites        Sternocleidomastoid  40 40 1  Splenius Capitus, posterior approach 70  70 1  Splenius Capitus, lateral approach 40  40 1  Levator Scapulae      Trapezius            TOTAL UNITS:   150    Agent: Botulinum Type A ( Onobotulinum Toxin type A ).  1 vials of Botox were used, each containing 100 units and freshly diluted with 1 mL of sterile, non-preserved saline   Total injected (Units): 150  Total wasted (Units): none wasted  NOTE:  May add R levator scapulae in future injections.  Will reassess Pt tolerated procedure well without complications.   Reinjection is anticipated in 3 months.

## 2018-09-12 DIAGNOSIS — E782 Mixed hyperlipidemia: Secondary | ICD-10-CM | POA: Diagnosis not present

## 2018-09-12 DIAGNOSIS — E785 Hyperlipidemia, unspecified: Secondary | ICD-10-CM | POA: Diagnosis not present

## 2018-09-12 DIAGNOSIS — E119 Type 2 diabetes mellitus without complications: Secondary | ICD-10-CM | POA: Diagnosis not present

## 2018-09-12 DIAGNOSIS — E1140 Type 2 diabetes mellitus with diabetic neuropathy, unspecified: Secondary | ICD-10-CM | POA: Diagnosis not present

## 2018-09-15 DIAGNOSIS — J069 Acute upper respiratory infection, unspecified: Secondary | ICD-10-CM | POA: Diagnosis not present

## 2018-09-17 DIAGNOSIS — M1712 Unilateral primary osteoarthritis, left knee: Secondary | ICD-10-CM | POA: Diagnosis not present

## 2018-09-17 DIAGNOSIS — M1711 Unilateral primary osteoarthritis, right knee: Secondary | ICD-10-CM | POA: Diagnosis not present

## 2018-09-17 DIAGNOSIS — E1140 Type 2 diabetes mellitus with diabetic neuropathy, unspecified: Secondary | ICD-10-CM | POA: Diagnosis not present

## 2018-09-17 DIAGNOSIS — E782 Mixed hyperlipidemia: Secondary | ICD-10-CM | POA: Diagnosis not present

## 2018-09-17 DIAGNOSIS — I10 Essential (primary) hypertension: Secondary | ICD-10-CM | POA: Diagnosis not present

## 2018-09-17 DIAGNOSIS — Z6834 Body mass index (BMI) 34.0-34.9, adult: Secondary | ICD-10-CM | POA: Diagnosis not present

## 2018-09-23 DIAGNOSIS — M170 Bilateral primary osteoarthritis of knee: Secondary | ICD-10-CM | POA: Diagnosis not present

## 2018-09-23 DIAGNOSIS — I10 Essential (primary) hypertension: Secondary | ICD-10-CM | POA: Diagnosis not present

## 2018-09-23 DIAGNOSIS — Z6835 Body mass index (BMI) 35.0-35.9, adult: Secondary | ICD-10-CM | POA: Diagnosis not present

## 2018-09-23 DIAGNOSIS — E1140 Type 2 diabetes mellitus with diabetic neuropathy, unspecified: Secondary | ICD-10-CM | POA: Diagnosis not present

## 2018-09-23 DIAGNOSIS — G609 Hereditary and idiopathic neuropathy, unspecified: Secondary | ICD-10-CM | POA: Diagnosis not present

## 2018-09-24 ENCOUNTER — Ambulatory Visit (INDEPENDENT_AMBULATORY_CARE_PROVIDER_SITE_OTHER): Payer: PPO | Admitting: Gastroenterology

## 2018-09-24 ENCOUNTER — Encounter: Payer: Self-pay | Admitting: Gastroenterology

## 2018-09-24 VITALS — BP 118/76 | HR 66 | Ht 61.0 in

## 2018-09-24 DIAGNOSIS — R053 Chronic cough: Secondary | ICD-10-CM

## 2018-09-24 DIAGNOSIS — R1314 Dysphagia, pharyngoesophageal phase: Secondary | ICD-10-CM | POA: Diagnosis not present

## 2018-09-24 DIAGNOSIS — G231 Progressive supranuclear ophthalmoplegia [Steele-Richardson-Olszewski]: Secondary | ICD-10-CM

## 2018-09-24 DIAGNOSIS — R05 Cough: Secondary | ICD-10-CM

## 2018-09-24 NOTE — Patient Instructions (Signed)
If you are age 67 or older, your body mass index should be between 23-30. Your Body mass index is 35.9 kg/m. If this is out of the aforementioned range listed, please consider follow up with your Primary Care Provider.  If you are age 27 or younger, your body mass index should be between 19-25. Your Body mass index is 35.9 kg/m. If this is out of the aformentioned range listed, please consider follow up with your Primary Care Provider.   You have been scheduled for a Barium Esophogram at [facility]iology (1st floor of the hospital) on 10-01-2018. Please arrive 15 minutes prior to your appointment for registration. Make certain not to have anything to eat or drink 3 hours prior to your test. If you need to reschedule for any reason, please contact radiology at [phone] to do so. __________________________________________________________________ A barium swallow is an examination that concentrates on views of the esophagus. This tends to be a double contrast exam (barium and two liquids which, when combined, create a gas to distend the wall of the oesophagus) or single contrast (non-ionic iodine based). The study is usually tailored to your symptoms so a good history is essential. Attention is paid during the study to the form, structure and configuration of the esophagus, looking for functional disorders (such as aspiration, dysphagia, achalasia, motility and reflux) EXAMINATION You may be asked to change into a gown, depending on the type of swallow being performed. A radiologist and radiographer will perform the procedure. The radiologist will advise you of the type of contrast selected for your procedure and direct you during the exam. You will be asked to stand, sit or lie in several different positions and to hold a small amount of fluid in your mouth before being asked to swallow while the imaging is performed .In some instances you may be asked to swallow barium coated marshmallows to assess  the motility of a solid food bolus. The exam can be recorded as a digital or video fluoroscopy procedure. POST PROCEDURE It will take 1-2 days for the barium to pass through your system. To facilitate this, it is important, unless otherwise directed, to increase your fluids for the next 24-48hrs and to resume your normal diet.  This test typically takes about 30 minutes to perform. __________________________________________________________________________________  [name] [name] have been scheduled for a modified barium swallow on 10-01-2018 at 1130am. Please arrive 15 minutes prior to your test for registration. You will go to [facility] (1st Floor) for your appointment. Should you need to cancel or reschedule your appointment, please contact [phone] [name] [name]) or [phone] [name] [name]). _____________________________________________________________________ A Modified Barium Swallow Study, or MBS, is a special x-ray that is taken to check swallowing skills. It is carried out by a Stage manager and a Psychologist, clinical (SLP). During this test, yourmouth, throat, and esophagus, a muscular tube which connects your mouth to your stomach, is checked. The test will help you, your doctor, and the SLP plan what types of foods and liquids are easier for you to swallow. The SLP will also identify positions and ways to help you swallow more easily and safely. What will happen during an MBS? You will be taken to an x-ray room and seated comfortably. You will be asked to swallow small amounts of food and liquid mixed with barium. Barium is a liquid or paste that allows images of your mouth, throat and esophagus to be seen on x-ray. The x-ray captures moving images of the food you are swallowing  as it travels from your mouth through your throat and into your esophagus. This test helps identify whether food or liquid is entering your lungs (aspiration). The test also shows which part of your mouth or  throat lacks strength or coordination to move the food or liquid in the right direction. This test typically takes 30 minutes to 1 hour to complete. _______________________________________________________________________ It was a pleasure to see you today!  [name]

## 2018-09-24 NOTE — Progress Notes (Signed)
[facility] Consult Note:  History: [name] [name] 09/24/2018  Referring physician: Alonza Bogus, MD  Reason for consult/chief complaint: Colonoscopy (Discuss having a colonoscopy because she is due )   Subjective  HPI:  This is a 68 year old woman referred by Dr. Carles [name] of neurology for worsening dysphagia.  The patient was under the impression she was here to discuss a possible colonoscopy due to history of colon polyps.  Colonoscopy by Dr. Almyra [name] August 2009, 4 polyps removed.  Pathology report only indicates one polyp, tubular adenoma Colonoscopy December 2017 complete to cecum, good preparation.  5 sessile polyps in transverse, ascending and cecum, measuring 2 to 3 mm in size.  8 mm polyp sessile in transverse colon removed with hot snare.  Pathology of the multiple polyps revealed tubular adenoma.  Transverse colon polyp tubulovillous adenoma. Internal HR noted as well (and patient had reported several episodes of rectal bleedig to Dr. Benson [name] prior to colonoscopy)  Dr. Benson [name] recommended repeat colonoscopy at a 3-year interval after the 2017 exam.   Chart review reveals that [name] [name] was seen by her neurologist in April of this year and had complaints of worsening dysphagia and coughing while eating.  He was noted that she had had a modified barium study November 2018 showing mild pharyngeal dysphagia.  [name] [name] requested to be seen by someone within [facility] system, so she was referred to [place], but was unable to make an appointment in June since she was hospitalized for severe pyelonephritis with sepsis and acute respiratory failure.  She has progressive supranuclear palsy, and feels that her coughing while eating and dysphagia have worsened over the last year.  Her general functional status has declined.  She lives at a retirement community with living help 24 hours a day. She has not noticed whether she has relatively more trouble swallowing solids or liquids.  She  feels like her appetite is reasonably good and thinks her weight is stable.  ROS:  Review of Systems  Constitutional: Positive for fatigue. Negative for appetite change and unexpected weight change.  HENT: Negative for mouth sores and voice change.   Eyes: Negative for pain and redness.  Respiratory: Positive for cough. Negative for shortness of breath.   Cardiovascular: Negative for chest pain and palpitations.  Genitourinary: Negative for dysuria, hematuria and vaginal discharge.  Musculoskeletal: Positive for arthralgias and myalgias.  Skin: Negative for pallor and rash.  Allergic/Immunologic: Positive for environmental allergies.  Neurological: Positive for tremors, speech difficulty and weakness. Negative for headaches.  Hematological: Negative for adenopathy.  Psychiatric/Behavioral:       Depression Difficulty sleeping     Past Medical History: Past Medical History:  Diagnosis Date  . Acute kidney failure (HCC)    hx of   . Arthritis    fingers,right shoulder  . Bradycardia   . 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 CHF (congestive heart failure) (HCC)    acute diastolic ( congestive ) heart failure)   . Chronic lumbar radiculopathy    L5- S1  right leg  . Constipation   . Depression   . Diverticulosis large intestine w/o perforation or abscess w/bleeding   . Dizziness   . Dry eye syndrome of bilateral lacrimal glands   . Frequency of urination   . GERD (gastroesophageal reflux disease)   . Hard of hearing   . History of kidney stones 05/12/15   surgery   . HTN (hypertension)   .  Hyperlipidemia   . Hypokalemia   . Morbid obesity due to excess calories ([place])   . 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 Numbness and tingling in hands   . OSA (obstructive sleep apnea)    moderate OSA per study 11-22-2007--  refused CPAP but used oxygen for 6 months at night,  states due to wt loss stopped using  oxygen  . Polyneuropathy, diabetic ([place])    WALKS W/ CANE FOR BALANCE  . 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 Progressive supranuclear palsy ([place])   . Pyonephrosis   . Pyonephrosis   . Radiculopathy of lumbar region   . Respiratory failure (HCC)    hx of acute respiratory failure wtih hypoxia   . Right ureteral stone   . Severe sepsis with septic shock (CODE) ([place])   . Shortness of breath dyspnea   . Spinal stenosis, cervical region   . SUI (stress urinary incontinence, female)   . Tubulo-interstitial nephritis   . Type 2 diabetes mellitus (HCC)    Type II - Diet controlled  . Urinary incontinence   . Urinary tract infection   . Wears glasses      Past Surgical History: Past Surgical History:  Procedure Laterality Date  . ANTERIOR CERVICAL DECOMP/DISCECTOMY FUSION N/A 03/23/2016   Procedure: Cervical Four-Five, Cervical Five-Six, Cervical Six-Seven Anterior cervical decompression/diskectomy/fusion;  Surgeon: [name] Cha, MD;  Location: [facility];  Service: Neurosurgery;  Laterality: N/A;  C4-5 C5-6 C6-7 Anterior cervical decompression/diskectomy/fusion  . CARDIOVASCULAR STRESS TEST  09-30-2007     normal Adenosine study/  no ischemia /  normal LV function and wall motion , ef 82%  . COLONOSCOPY    . CYSTOSCOPY WITH RETROGRADE PYELOGRAM, URETEROSCOPY AND STENT PLACEMENT Right 05/12/2015   Procedure: CYSTOSCOPY WITH RETROGRADE PYELOGRAM, URETEROSCOPY,STONE EXTRACTION AND STENT PLACEMENT;  Surgeon: [name] Gallo, MD;  Location: [facility];  Service: Urology;  Laterality: Right;  . CYSTOSCOPY WITH RETROGRADE PYELOGRAM, URETEROSCOPY AND STENT PLACEMENT Right 04/25/2018   Procedure: CYSTOSCOPY WITH RETROGRADE PYELOGRAM, URETEROSCOPY AND STENT EXCHANGE;  Surgeon: [name] Frock, MD;  Location: [facility];  Service: Urology;  Laterality: Right;  . CYSTOSCOPY WITH STENT PLACEMENT Right 03/28/2018   Procedure: CYSTOSCOPY WITH STENT PLACEMENT retrograde  pylegram;  Surgeon: [name] Frock, MD;  Location: [facility];  Service: Urology;  Laterality: Right;  . EXTRACORPOREAL SHOCK WAVE LITHOTRIPSY Right 08-18-2012  . HOLMIUM LASER APPLICATION Right 5/36/6440   Procedure: HOLMIUM LASER APPLICATION;  Surgeon: [name] Gallo, MD;  [facility];  Service: Urology;  Laterality: Right;  . HOLMIUM LASER APPLICATION Right 3/47/4259   Procedure: HOLMIUM LASER APPLICATION;  Surgeon: [name] Frock, MD;  [facility]: [facility];  Service: Urology;  Laterality: Right;  . ORIF HUMERUS FRACTURE Right 07/20/2016   Procedure: OPEN REDUCTION INTERNAL FIXATION (ORIF) PROXIMAL HUMERUS FRACTURE VS REVERSE TOTAL SHOULDER ARTHROPLASTY;  Surgeon: Netta Cedars, MD;  Location: [facility];  Service: Orthopedics;  Laterality: Right;  . SHOULDER ARTHROSCOPY Right 07/2016  . TRANSTHORACIC ECHOCARDIOGRAM  09-23-2007   pseudonormal LV filling pattern,  ef 65-70%/  trivial AR/  mild LAE  . TUBAL LIGATION  1985     Family History: Family History  Problem Relation Age of Onset  . Heart Problems Father   . Stroke Mother   . Hypertension Unknown   . Stroke Unknown   . Breast cancer Sister     Social History: Social History   Socioeconomic History  .  Marital status: Widowed    Spouse name: Not on file  . Number of children: 1  . Years of education: College  . Highest education level: Not on file  Occupational History  . Occupation: Therapist, sports: [employer]  . Financial resource strain: Not on file  . Food insecurity:    Worry: Not on file    Inability: Not on file  . Transportation needs:    Medical: Not on file    Non-medical: Not on file  Tobacco Use  . Smoking status: Never Smoker  . Smokeless tobacco: Never Used  Substance and Sexual Activity  . Alcohol use: Yes    Alcohol/week: 0.0 standard drinks    Comment: 3- 4 times a year  . Drug use: No  . Sexual activity: Not on file  Lifestyle  . Physical  activity:    Days per week: Not on file    Minutes per session: Not on file  . Stress: Not on file  Relationships  . Social connections:    Talks on phone: Not on file    Gets together: Not on file    Attends religious service: Not on file    Active member of club or organization: Not on file    Attends meetings of clubs or organizations: Not on file    Relationship status: Not on file  Other Topics Concern  . Not on file  Social History Narrative   Patient lives at home with her dog name "Hot Rod".   Caffeine Use: 2-3 cups of coffee a day     Allergies: Allergies  Allergen Reactions  . Aleve [Naproxen] Hives, Shortness Of Breath and Rash    Pt Avoids All Nsaids  . Protonix [Pantoprazole Sodium]     Makes her feel wreidd  . Zocor [Simvastatin] Other (See Comments)    "weird feeling all over body"    Outpatient Meds: Current Outpatient Medications  Medication Sig Dispense Refill  . acetaminophen (TYLENOL) 325 MG tablet Take 650 mg by mouth 2 (two) times daily.     Marland Kitchen aspirin 81 MG chewable tablet Chew 81 mg by mouth daily.    Marland Kitchen azelastine (ASTELIN) 0.1 % nasal spray Place 1 spray into both nostrils 2 (two) times daily.   11  . carbidopa-levodopa (SINEMET IR) 25-100 MG tablet 2 tablets TID 540 tablet 1  . Cholecalciferol (VITAMIN D PO) Take 5,000 Units by mouth daily.    . cyanocobalamin 500 MCG tablet Take 500 mcg by mouth daily.    Marland Kitchen escitalopram (LEXAPRO) 10 MG tablet Take 10 mg by mouth at bedtime.   2  . fenofibrate 160 MG tablet Take 160 mg by mouth every morning.     . fluticasone (FLONASE) 50 MCG/ACT nasal spray Place 2 sprays into both nostrils daily.   11  . loratadine (CLARITIN) 10 MG tablet Take 10 mg by mouth. Taking every other day.    . Melatonin 5 MG CAPS Take 10 mg by mouth at bedtime as needed.     . Menthol, Topical Analgesic, (BIOFREEZE EX) Apply topically.    . montelukast (SINGULAIR) 10 MG tablet Take 10 mg by mouth at bedtime.    Marland Kitchen nystatin cream  (MYCOSTATIN) Apply 1 application topically daily.    Marland Kitchen olmesartan (BENICAR) 40 MG tablet Take 40 mg by mouth daily.    . traMADol (ULTRAM) 50 MG tablet Take 1 tablet (50 mg total) by mouth every 6 (six) hours  as needed for moderate pain or severe pain. Post-operatively 20 tablet 0   No current facility-administered medications for this visit.       ___________________________________________________________________ Objective   Exam:  BP 118/76   Pulse 66   Ht 5\' 1"  (1.549 m)   SpO2 96%   BMI 35.90 kg/m    General: this is a(n) chronically ill-appearing woman in a wheelchair, alert and conversational, and was unable to stand to have her weight checked today.  She has altered vocal quality, frequently coughs seems to have difficulty clearing secretions.  Is accompanied by her good friend [name] today  Eyes: sclera anicteric, no redness  ENT: oral mucosa moist without lesions, no cervical or supraclavicular lymphadenopathy, good dentition  CV: RRR without murmur, S1/S2, no JVD, no peripheral edema  Resp: Transmitted upper airway sounds, normal RR and effort noted  GI: soft, no tenderness, with active bowel sounds.  Limited exam in wheelchair, cannot get on exam table  Skin; warm and dry, no rash or jaundice noted  Neuro: awake, alert and oriented x 3.  Globally weakened gross motor function  Labs:  CBC Latest Ref Rng & Units 04/25/2018 04/03/2018 04/02/2018  WBC 4.0 - 10.5 K/uL 7.3 11.2(H) 11.9(H)  Hemoglobin 12.0 - 15.0 g/dL 12.5 12.0 11.3(L)  Hematocrit 36.0 - 46.0 % 39.4 37.2 34.8(L)  Platelets 150 - 400 K/uL 273 231 168     Radiologic Studies:  MBS Nov 2018 showing pharyngeal dysphagia  Assessment: Encounter Diagnoses  Name Primary?  Marland Kitchen Dysphagia, pharyngoesophageal phase Yes  . PSP (progressive supranuclear palsy) ([place])   . Chronic cough     Her worsening oropharyngeal dysphagia appears due to her progressive neurological condition.  Despite reported finding on  MBS last year that there was a column of barium not completely evaluated on that study within the body of the esophagus, this is not behaving like a case of esophageal dysphagia.  Plan:  Modified barium swallow, hopefully can determine food consistency and perhaps swallowing techniques that might help her eat and drink more safely.  It does not sound like she and Dr. Carles [name] have had a discussion about whether she would ever want a feeding tube when her neurologic condition progresses to the point that she can no longer safely swallow.  I have not put her on our colonoscopy recall list, as I think the risks outweigh the expected benefits given her overall condition.  She would generally be due about a year from now for surveillance colonoscopy, but it seems very unlikely she would be appropriate for colonoscopy at that time given her progressive neurological condition.  Thank you for the courtesy of this consult.  Please call me with any questions or concerns.  Nelida Meuse III  CC: [name], MD [name] Bogus, MD

## 2018-09-26 ENCOUNTER — Other Ambulatory Visit (HOSPITAL_COMMUNITY): Payer: Self-pay | Admitting: Gastroenterology

## 2018-09-26 DIAGNOSIS — R1310 Dysphagia, unspecified: Secondary | ICD-10-CM

## 2018-09-30 DIAGNOSIS — M170 Bilateral primary osteoarthritis of knee: Secondary | ICD-10-CM | POA: Diagnosis not present

## 2018-10-01 ENCOUNTER — Ambulatory Visit (HOSPITAL_COMMUNITY): Payer: PPO

## 2018-10-01 ENCOUNTER — Encounter (HOSPITAL_COMMUNITY): Payer: PPO

## 2018-10-01 ENCOUNTER — Inpatient Hospital Stay (HOSPITAL_COMMUNITY): Admission: RE | Admit: 2018-10-01 | Payer: PPO | Source: Ambulatory Visit

## 2018-10-01 ENCOUNTER — Ambulatory Visit (HOSPITAL_COMMUNITY)
Admission: RE | Admit: 2018-10-01 | Discharge: 2018-10-01 | Disposition: A | Discharge status: Home / Self Care | Payer: PPO | Source: Ambulatory Visit | Attending: Gastroenterology | Admitting: Gastroenterology

## 2018-10-01 DIAGNOSIS — Z9889 Other specified postprocedural states: Secondary | ICD-10-CM | POA: Insufficient documentation

## 2018-10-01 DIAGNOSIS — R05 Cough: Secondary | ICD-10-CM

## 2018-10-01 DIAGNOSIS — G4733 Obstructive sleep apnea (adult) (pediatric): Secondary | ICD-10-CM | POA: Insufficient documentation

## 2018-10-01 DIAGNOSIS — R1310 Dysphagia, unspecified: Secondary | ICD-10-CM

## 2018-10-01 DIAGNOSIS — E119 Type 2 diabetes mellitus without complications: Secondary | ICD-10-CM

## 2018-10-01 DIAGNOSIS — G231 Progressive supranuclear ophthalmoplegia [Steele-Richardson-Olszewski]: Secondary | ICD-10-CM | POA: Insufficient documentation

## 2018-10-01 DIAGNOSIS — R1312 Dysphagia, oropharyngeal phase: Secondary | ICD-10-CM | POA: Diagnosis not present

## 2018-10-01 DIAGNOSIS — R41840 Attention and concentration deficit: Secondary | ICD-10-CM | POA: Diagnosis not present

## 2018-10-01 DIAGNOSIS — I10 Essential (primary) hypertension: Secondary | ICD-10-CM | POA: Insufficient documentation

## 2018-10-01 DIAGNOSIS — R1314 Dysphagia, pharyngoesophageal phase: Secondary | ICD-10-CM | POA: Insufficient documentation

## 2018-10-01 DIAGNOSIS — R053 Chronic cough: Secondary | ICD-10-CM

## 2018-10-01 NOTE — Progress Notes (Signed)
Objective Swallowing Evaluation: Type of Study: MBS-Modified Barium Swallow Study   Patient Details  Name: [name] [name] MRN: [id number] Date of Birth: 02-26-1951  Today's Date: 10/01/2018 Time: SLP Start Time (ACUTE ONLY): 1122 -SLP Stop Time (ACUTE ONLY): 1149  SLP Time Calculation (min) (ACUTE ONLY): 27 min   Past Medical History:  Past Medical History:  Diagnosis Date  . Acute kidney failure (HCC)    hx of   . Arthritis    fingers,right shoulder  . Bradycardia   . 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 CHF (congestive heart failure) (HCC)    acute diastolic ( congestive ) heart failure)   . Chronic lumbar radiculopathy    L5- S1  right leg  . Constipation   . Depression   . Diverticulosis large intestine w/o perforation or abscess w/bleeding   . Dizziness   . Dry eye syndrome of bilateral lacrimal glands   . Frequency of urination   . GERD (gastroesophageal reflux disease)   . Hard of hearing   . History of kidney stones 05/12/15   surgery   . HTN (hypertension)   . Hyperlipidemia   . Hypokalemia   . Morbid obesity due to excess calories ([place])   . 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 Numbness and tingling in hands   . OSA (obstructive sleep apnea)    moderate OSA per study 11-22-2007--  refused CPAP but used oxygen for 6 months at night,  states due to wt loss stopped using oxygen  . Polyneuropathy, diabetic ([place])    WALKS W/ CANE FOR BALANCE  . 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 Progressive supranuclear palsy ([place])   . Pyonephrosis   . Pyonephrosis   . Radiculopathy of lumbar region   . Respiratory failure (HCC)    hx of acute respiratory failure wtih hypoxia   . Right ureteral stone   . Severe sepsis with septic shock (CODE) ([place])   . Shortness of breath dyspnea   . Spinal stenosis, cervical region   . SUI (stress urinary incontinence, female)   . Tubulo-interstitial  nephritis   . Type 2 diabetes mellitus (HCC)    Type II - Diet controlled  . Urinary incontinence   . Urinary tract infection   . Wears glasses    Past Surgical History:  Past Surgical History:  Procedure Laterality Date  . ANTERIOR CERVICAL DECOMP/DISCECTOMY FUSION N/A 03/23/2016   Procedure: Cervical Four-Five, Cervical Five-Six, Cervical Six-Seven Anterior cervical decompression/diskectomy/fusion;  Surgeon: Leeroy Cha, MD;  Location: [place] [facility];  Service: Neurosurgery;  Laterality: N/A;  C4-5 C5-6 C6-7 Anterior cervical decompression/diskectomy/fusion  . CARDIOVASCULAR STRESS TEST  09-30-2007     normal Adenosine study/  no ischemia /  normal LV function and wall motion , ef 82%  . COLONOSCOPY    . CYSTOSCOPY WITH RETROGRADE PYELOGRAM, URETEROSCOPY AND STENT PLACEMENT Right 05/12/2015   Procedure: CYSTOSCOPY WITH RETROGRADE PYELOGRAM, URETEROSCOPY,STONE EXTRACTION AND STENT PLACEMENT;  Surgeon: Franchot Gallo, MD;  [facility];  Service: Urology;  Laterality: Right;  . CYSTOSCOPY WITH RETROGRADE PYELOGRAM, URETEROSCOPY AND STENT PLACEMENT Right 04/25/2018   Procedure: CYSTOSCOPY WITH RETROGRADE PYELOGRAM, URETEROSCOPY AND STENT EXCHANGE;  Surgeon: Alexis Frock, MD;  [facility];  Service: Urology;  Laterality: Right;  . CYSTOSCOPY WITH STENT PLACEMENT Right 03/28/2018   Procedure: CYSTOSCOPY WITH STENT PLACEMENT retrograde pylegram;  Surgeon: Tresa Moore,  Hubbard Robinson, MD;  [facility];  Service: Urology;  Laterality: Right;  . EXTRACORPOREAL SHOCK WAVE LITHOTRIPSY Right 08-18-2012  . HOLMIUM LASER APPLICATION Right 12/09/7865   Procedure: HOLMIUM LASER APPLICATION;  Surgeon: Franchot Gallo, MD;  Location: [facility];  Service: Urology;  Laterality: Right;  . HOLMIUM LASER APPLICATION Right 6/72/0947   Procedure: HOLMIUM LASER APPLICATION;  Surgeon: Alexis Frock, MD;  [facility];  Service: Urology;  Laterality: Right;  . ORIF  HUMERUS FRACTURE Right 07/20/2016   Procedure: OPEN REDUCTION INTERNAL FIXATION (ORIF) PROXIMAL HUMERUS FRACTURE VS REVERSE TOTAL SHOULDER ARTHROPLASTY;  Surgeon: Netta Cedars, MD;  Location: [place];  Service: Orthopedics;  Laterality: Right;  . SHOULDER ARTHROSCOPY Right 07/2016  . TRANSTHORACIC ECHOCARDIOGRAM  09-23-2007   pseudonormal LV filling pattern,  ef 65-70%/  trivial AR/  mild LAE  . TUBAL LIGATION  1985   HPI: [name] [name] is a 67 y.o. female with PMH significant for DM II, OSA on CPAP, obesity, chronic cough, CHF, ACDF (2017), progressive supranuclear palsy, GERD, and dysphagia. Neurologist note indicated pt + tremors, speech difficulty, and weakness. Per MD note 09/24/18, pt's dysphagia has worsened over last year and complains of frequent coughing during PO intake (both solids and liquids). Pt reported she has been consuming thickened liquids (reccomended by her [place] night nurse) prior to this study. MBSS November 2018 showed mild pharyngeal dysphagia (only penetration of larger thin boluses), coughing throughout eval suspected due to esophageal stasis upon brief scan; ultimately reccomended reg/thin and consult for esophageal/GI assessment.    No data recorded   Assessment / Plan / Recommendation  CHL IP CLINICAL IMPRESSIONS 10/01/2018  Clinical Impression Pt currently exhibits mild sensory-based oropharyngeal dysphagia. Noteworthy delayed swallow initiation at the pyriform sinuses was consistent with thin liquid and remains largely unchanged from previous MBSS conducted in November of 2018. Pt displayed one instance of penetration above the vocal folds when consuming large consecutive sips of thin liquid from a straw. More timely swallow initiation with full airway protection was present with puree and regular textures. Lingual pumping was also noted across thin, puree, and regular textures. SLP educated pt and husband regarding her neurodegenerative dx of progressive supranuclear palsy  (PSP) as it relates to swallowing and the potential for future difficulties. Recommend pt continue regular diet, thin liquids, take small bites and sips, use added caution with dual consistencies, take pills one at a time with thin or in puree, assume upright positioning during and at least 30 minutes after all PO intake. Furthermore, recommend pt consult with outpatient SLP for skilled intervention with current diet safety and efficiency as well as determination of appropriateness for repeat MBSS as PSP advances.    SLP Visit Diagnosis Dysphagia, oropharyngeal phase (R13.12)  Attention and concentration deficit following --  Frontal lobe and executive function deficit following --  Impact on safety and function Moderate aspiration risk;Mild aspiration risk      CHL IP TREATMENT RECOMMENDATION 10/01/2018  Treatment Recommendations Defer treatment plan to f/u with SLP     No flowsheet data found.  CHL IP DIET RECOMMENDATION 10/01/2018  SLP Diet Recommendations Thin liquid;Regular solids  Liquid Administration via Straw;Cup  Medication Administration Whole meds with liquid  Compensations Slow rate;Small sips/bites  Postural Changes Seated upright at 90 degrees;Remain semi-upright after after feeds/meals (Comment)      CHL IP OTHER RECOMMENDATIONS 10/01/2018  Recommended Consults Consider GI evaluation  Oral Care Recommendations Oral care BID  Other Recommendations --  CHL IP FOLLOW UP RECOMMENDATIONS 10/01/2018  Follow up Recommendations Outpatient SLP      No flowsheet data found.         CHL IP ORAL PHASE 10/01/2018  Oral Phase Impaired  Oral - Pudding Teaspoon --  Oral - Pudding Cup --  Oral - Honey Teaspoon --  Oral - Honey Cup --  Oral - Nectar Teaspoon --  Oral - Nectar Cup --  Oral - Nectar Straw --  Oral - Thin Teaspoon --  Oral - Thin Cup Lingual pumping  Oral - Thin Straw Lingual pumping  Oral - Puree Lingual pumping  Oral - Mech Soft --  Oral - Regular  Lingual pumping  Oral - Multi-Consistency --  Oral - Pill Decreased bolus cohesion  Oral Phase - Comment --    CHL IP PHARYNGEAL PHASE 10/01/2018  Pharyngeal Phase Impaired  Pharyngeal- Pudding Teaspoon --  Pharyngeal --  Pharyngeal- Pudding Cup --  Pharyngeal --  Pharyngeal- Honey Teaspoon --  Pharyngeal --  Pharyngeal- Honey Cup --  Pharyngeal --  Pharyngeal- Nectar Teaspoon --  Pharyngeal --  Pharyngeal- Nectar Cup --  Pharyngeal --  Pharyngeal- Nectar Straw --  Pharyngeal --  Pharyngeal- Thin Teaspoon --  Pharyngeal --  Pharyngeal- Thin Cup Delayed swallow initiation-pyriform sinuses  Pharyngeal --  Pharyngeal- Thin Straw Delayed swallow initiation-pyriform sinuses;Penetration/Aspiration during swallow  Pharyngeal Material enters airway, remains ABOVE vocal cords and not ejected out  Pharyngeal- Puree WFL  Pharyngeal --  Pharyngeal- Mechanical Soft --  Pharyngeal --  Pharyngeal- Regular WFL  Pharyngeal --  Pharyngeal- Multi-consistency --  Pharyngeal --  Pharyngeal- Pill WFL  Pharyngeal --  Pharyngeal Comment --     CHL IP CERVICAL ESOPHAGEAL PHASE 10/01/2018  Cervical Esophageal Phase WFL  Pudding Teaspoon --  Pudding Cup --  Honey Teaspoon --  Honey Cup --  Nectar Teaspoon --  Nectar Cup --  Nectar Straw --  Thin Teaspoon --  Thin Cup --  Thin Straw --  Puree --  Mechanical Soft --  Regular --  Multi-consistency --  Pill --  Cervical Esophageal Comment --     Houston Siren 10/01/2018, 2:47 PM       Orbie Pyo Mrk Buzby M.Ed Risk analyst [phone] Office [phone]

## 2018-10-06 DIAGNOSIS — L980 Pyogenic granuloma: Secondary | ICD-10-CM | POA: Diagnosis not present

## 2018-10-06 DIAGNOSIS — G1220 Motor neuron disease, unspecified: Secondary | ICD-10-CM | POA: Diagnosis not present

## 2018-10-06 DIAGNOSIS — L8990 Pressure ulcer of unspecified site, unspecified stage: Secondary | ICD-10-CM | POA: Diagnosis not present

## 2018-10-07 DIAGNOSIS — E1140 Type 2 diabetes mellitus with diabetic neuropathy, unspecified: Secondary | ICD-10-CM | POA: Diagnosis not present

## 2018-10-07 DIAGNOSIS — I5031 Acute diastolic (congestive) heart failure: Secondary | ICD-10-CM | POA: Diagnosis not present

## 2018-10-07 DIAGNOSIS — I110 Hypertensive heart disease with heart failure: Secondary | ICD-10-CM | POA: Diagnosis not present

## 2018-10-07 DIAGNOSIS — G20 Parkinson's disease: Secondary | ICD-10-CM | POA: Diagnosis not present

## 2018-10-07 DIAGNOSIS — L89312 Pressure ulcer of right buttock, stage 2: Secondary | ICD-10-CM | POA: Diagnosis not present

## 2018-10-13 DIAGNOSIS — J069 Acute upper respiratory infection, unspecified: Secondary | ICD-10-CM | POA: Diagnosis not present

## 2018-10-13 DIAGNOSIS — L980 Pyogenic granuloma: Secondary | ICD-10-CM | POA: Diagnosis not present

## 2018-10-13 DIAGNOSIS — L8990 Pressure ulcer of unspecified site, unspecified stage: Secondary | ICD-10-CM | POA: Diagnosis not present

## 2018-10-14 ENCOUNTER — Other Ambulatory Visit: Payer: PPO | Admitting: Internal Medicine

## 2018-10-14 DIAGNOSIS — I5031 Acute diastolic (congestive) heart failure: Secondary | ICD-10-CM | POA: Diagnosis not present

## 2018-10-14 DIAGNOSIS — R238 Other skin changes: Secondary | ICD-10-CM | POA: Diagnosis not present

## 2018-10-14 DIAGNOSIS — I110 Hypertensive heart disease with heart failure: Secondary | ICD-10-CM | POA: Diagnosis not present

## 2018-10-14 DIAGNOSIS — G20 Parkinson's disease: Secondary | ICD-10-CM | POA: Diagnosis not present

## 2018-10-14 DIAGNOSIS — R531 Weakness: Secondary | ICD-10-CM

## 2018-10-14 DIAGNOSIS — Z515 Encounter for palliative care: Secondary | ICD-10-CM | POA: Diagnosis not present

## 2018-10-14 DIAGNOSIS — L89312 Pressure ulcer of right buttock, stage 2: Secondary | ICD-10-CM | POA: Diagnosis not present

## 2018-10-14 DIAGNOSIS — E1140 Type 2 diabetes mellitus with diabetic neuropathy, unspecified: Secondary | ICD-10-CM | POA: Diagnosis not present

## 2018-10-15 ENCOUNTER — Telehealth (HOSPITAL_COMMUNITY): Payer: Self-pay | Admitting: Speech Pathology

## 2018-10-17 ENCOUNTER — Telehealth: Payer: Self-pay

## 2018-10-17 DIAGNOSIS — R1310 Dysphagia, unspecified: Secondary | ICD-10-CM

## 2018-10-17 NOTE — Telephone Encounter (Signed)
[name] Stabler, MD  Algernon Huxley, RN        Vaughan Basta,   See note from SLP below. Please contact patient and, if interested in this therapy, please place order as below.   Thanks.   - HD   Previous Messages    ----- Message -----  From: Valere Dross, CCC-SLP  Sent: 10/15/2018  4:13 PM EST  To: [name] Stabler, MD  Subject: Outpatient therapy for [name]      [name],  I am sorry for just now responding. We don't typically use this messaging system within the hospital and I didn't realize I had this message from your RN before today. In my MBS report I had recommended for outpatient ST for [name] [name] MRN [id number] and your RN was asking if your office needed to order this and yes. Yes, if your office would order and send to [name] on [address] and outpatient should call her to schedule. It was a recommendation I made to the patient but not completely sure if she wants to go/is agreeable. If you could call the pt to make sure before all the other leg work that would be great. Please let me know if I can offer more help. Thank You,   Darliss Cheney, M.Ed.CCC-SLP      Spoke with pt and she is interested in having speech therapy as an outpt. Orders entered in epic and pt knows to expect phone call to schedule SP.

## 2018-10-19 NOTE — Progress Notes (Signed)
[facility] Telephone: [phone] Fax: [phone]  PATIENT NAME: [name] [name] DOB: Jan 19, 1951 MRN: [id number]  PRIMARY CARE PROVIDER:   [name], MD  REFERRING PROVIDER:  [name], MD [address] [place], [place] [address]  RESPONSIBLE PARTY:   Self    RECOMMENDATIONS and PLAN:  1. Generalized weakness  R53.1:  Chronic state without recent falls. She requires continued assistance and monitoring of nutritional intake and mobility.  Expectation of increased weakness with progression of PSP.    2.  Skin irritation R23.8:  Change briefs often.  Moisture barrier creams after perineal skin cleansing. Notify PCP if no improvement or worsening of symptoms.  Monitor.  3.  Palliative care encounter  Z51.5: Pt. choses to remain in current independent living environment as long as she has assistance from caregivers. Her main goal is to avoid re-hospitalization. Palliative care will F/U in 2 months.  I spent  30 minutes providing this consultation at home,  from 9:30am to 10:00am More than 50% of the time in this consultation was spent coordinating communication with patient and caregiver friend.  She denies any recent falls, hospitalizations or dysphagia..  Her only complaint is of skin irritation of her medial R thigh where she normally has moisture collection from incontinence. She still has full-time in home assistance for her ADLs, meals and custodial affairs.  She is chair or bed-bound and is mobile via wheelchiair due to PSP.  CODE STATUS: DNR  PPS: 40% HOSPICE ELIGIBILITY/DIAGNOSIS: TBD  PAST MEDICAL HISTORY:  Past Medical History:  Diagnosis Date  . Acute kidney failure (HCC)    hx of   . Arthritis    fingers,right shoulder  . Bradycardia   . 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 CHF (congestive heart failure) (HCC)    acute diastolic ( congestive ) heart failure)   . Chronic lumbar radiculopathy     L5- S1  right leg  . Constipation   . Depression   . Diverticulosis large intestine w/o perforation or abscess w/bleeding   . Dizziness   . Dry eye syndrome of bilateral lacrimal glands   . Frequency of urination   . GERD (gastroesophageal reflux disease)   . Hard of hearing   . History of kidney stones 05/12/15   surgery   . HTN (hypertension)   . Hyperlipidemia   . Hypokalemia   . Morbid obesity due to excess calories ([place])   . 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 Numbness and tingling in hands   . OSA (obstructive sleep apnea)    moderate OSA per study 11-22-2007--  refused CPAP but used oxygen for 6 months at night,  states due to wt loss stopped using oxygen  . Polyneuropathy, diabetic ([place])    WALKS W/ CANE FOR BALANCE  . 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 Pyonephrosis   . Pyonephrosis   . Radiculopathy of lumbar region   . Respiratory failure (HCC)    hx of acute respiratory failure wtih hypoxia   . Right ureteral stone   . Severe sepsis with septic shock (CODE) ([place])   . Shortness of breath dyspnea   . Spinal stenosis, cervical region   . SUI (stress urinary incontinence, female)   . Tubulo-interstitial nephritis   . Type 2 diabetes mellitus ([place])  Type II - Diet controlled  . Urinary incontinence   . Urinary tract infection   . Wears glasses     SOCIAL HX:  Social History   Tobacco Use  . Smoking status: Never Smoker  . Smokeless tobacco: Never Used  Substance Use Topics  . Alcohol use: Yes    Alcohol/week: 0.0 standard drinks    Comment: 3- 4 times a year    ALLERGIES:  Allergies  Allergen Reactions  . Aleve [Naproxen] Hives, Shortness Of Breath and Rash    Pt Avoids All Nsaids  . Protonix [Pantoprazole Sodium]     Makes her feel wreidd  . Zocor [Simvastatin] Other (See Comments)    "weird feeling all over body"     PERTINENT MEDICATIONS:   Outpatient Encounter Medications as of 10/14/2018  Medication Sig  . acetaminophen (TYLENOL) 325 MG tablet Take 650 mg by mouth 2 (two) times daily.   Marland Kitchen aspirin 81 MG chewable tablet Chew 81 mg by mouth daily.  Marland Kitchen azelastine (ASTELIN) 0.1 % nasal spray Place 1 spray into both nostrils 2 (two) times daily.   . carbidopa-levodopa (SINEMET IR) 25-100 MG tablet 2 tablets TID  . Cholecalciferol (VITAMIN D PO) Take 5,000 Units by mouth daily.  . cyanocobalamin 500 MCG tablet Take 500 mcg by mouth daily.  Marland Kitchen escitalopram (LEXAPRO) 10 MG tablet Take 10 mg by mouth at bedtime.   . fenofibrate 160 MG tablet Take 160 mg by mouth every morning.   . fluticasone (FLONASE) 50 MCG/ACT nasal spray Place 2 sprays into both nostrils daily.   Marland Kitchen loratadine (CLARITIN) 10 MG tablet Take 10 mg by mouth. Taking every other day.  . Melatonin 5 MG CAPS Take 10 mg by mouth at bedtime as needed.   . Menthol, Topical Analgesic, (BIOFREEZE EX) Apply topically.  . montelukast (SINGULAIR) 10 MG tablet Take 10 mg by mouth at bedtime.  Marland Kitchen nystatin cream (MYCOSTATIN) Apply 1 application topically daily.  Marland Kitchen olmesartan (BENICAR) 40 MG tablet Take 40 mg by mouth daily.  . traMADol (ULTRAM) 50 MG tablet Take 1 tablet (50 mg total) by mouth every 6 (six) hours as needed for moderate pain or severe pain. Post-operatively   No facility-administered encounter medications on file as of 10/14/2018.     PHYSICAL EXAM:   General: Well nourished female sitting in recliner Cardiovascular: regular rate and rhythm Pulmonary: Clear and unlabored respirations Abdomen: soft, nontender, + bowel sounds GU: no suprapubic tenderness Extremities: Trace edema BLE,  Skin: Reports of skin irritation of R medial thigh but not visualized Neurological: A&O x3.  Moderate amount of gross dyskinesia noted. Forced speech.  Generalized weakness.  Gonzella Lex, NP-C

## 2018-10-28 NOTE — Progress Notes (Signed)
[name] [name] was seen today in the movement disorders clinic for neurologic consultation at the request of [name], MD.    The patient presents for a second opinion regarding falls and balance difficulties.  She has been seeing Dr. [name] since January, 2015 and most recently saw him on 11/25/2015 and saw his nurse practitioner on 12/14/2015.  I have taken a detailed review of the records from [facility] neurology.  Her initial complaint in January, 2015 was of dizziness and balance change.  By November, 2015, the patient's balance change and falls had become such a problem that Dr. [name] told the patient that he thought it was unsafe for her to live alone.  He noted that the patient was very short stepped in her gait and she was off balance and antalgic.  Over the course of time, she continued to have repetitive falls.  As of August, 2016 the patient began to complain of drooling.  In November, 2016, the patient began to complain of tremor when holding a cup, which she states has increased.  01/26/16 update:  The patient presents today for follow-up.  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  She has continued to have falls and fell on February 23 ended up in the emergency room.  They did not do a CT of the brain.  Fortunately, she did not lose consciousness with that fall.  She did fall 3 times the day after that but none since.  She has had some dizziness with the medication.  She did have labwork at our last visit.  Her zinc was normal.  Vitamin D was normal.  Celiac panel was normal.  RPR was negative.  Vitamin B12 was slightly low at 313 and I asked her to start a B12 supplement.  Folate was normal.  The patient wanted to follow-up here a little bit earlier.  States that the diplopia is getting worse.  She had an MRI of the cervical spine on 01/16/16 that I reviewed.  There is degenerative changes and significant NFS and  C5 on the L, bilaterally at C6 and 7.  03/13/16 update:  The patient presents today for follow-up.  She is accompanied by her in-home caregiver who supplements the history.  Her caregiver now comes a few hours to days a week.  I have reviewed records since last visit.  The patient saw Dr. [name] on 02/06/2016 and his notes stated that there was no doubt that the patient had cervical stenosis but he was not sure how much the surgery would help.  He told the patient to follow back up with me and my recommendations.  I subsequently got a call from the patient's primary care physician and she asked me the same question and I stated that while the surgery may help her pain, it was not going to help her falls, shuffling, or diplopia and the anesthesia could actually set the [place] back.  Last visit, I did increase her carbidopa/levodopa 25/100-2 tablets 3 times per day.  I offered to change her to the extended release to see if that would help the dizziness but she did not want to do that.  She eventually called me and stated that she was dizzy and stated that her primary care physician said that she may need discontinue the medication.  I asked her if she was sure that it was from the medication because dizziness was one of her  presenting complaints at onset in 2015.  I did tell her that she could hold the medication and see if the dizziness changed.  The patient states that she d/c the medication.  She states that she has fallen 4 times, 2 prior to the d/c of levodopa and 2 after.  She really noticed no change in the dizziness and in fact states that she is more dizzy now.  The patient did see Dr. [name] on April 20 in follow-up for her sleep apnea.  She states that she does not have to see Dr. [name] for another year and it was just recommended that she use her CPAP more and try melatonin.  06/15/16 update:  The patient presents today for follow-up.  This patient is accompanied in the office by her caregiver who supplements  the history.  She has a caregiver 2 days a week.   I have reviewed multiple records made available to me.  The patient had anterior cervical discectomy at the C4-C5, C5-C6, and C6-C7 levels on May 12.  She was discharged from the hospital on May 14.  She had no surgical complications.  She is on carbidopa/levodopa 25/100, 2 tablets in the morning, 2 in the afternoon and one in the evening.  She has had falls since surgery, the last of which was 7/26.  She was bent over to get dish washing detergent and fell on her knees.  She also fell on 7/5 and she tried to get up from the chair and her foot got caught.  She fell on 6/15 and she bruised her arm.  With the falls, the walker was present but she wasn't necessarily leaning on it at the time.  She is in PT right now 2-3 days a week.    10/16/16 update:  Pt presents for follow up.  She is accompanied by her caregiver and sister in law who supplement the history.She has MSA-C.  She is on carbidopa/levodopa 25/100, 2/2/1.  Asked me about driving last visit and I was leery because of diplopia so told her that she needed an ophthalmology evaluation.  She did go on 09/11/16 and they sent me handwritten notes but driving doesn't appear to have been addressed. She is supposed to get new glasses today.  She has not been driving much lately because of recent surgery, but admits that she did recently get released by her surgeon to drive and took a "test drive."  The records that were made available to me were reviewed.  Fell in August and sustained a humerus fx.  At that time, I recommend she stop walking all together and remain in a wheelchair at all times.  This was told to patient and her son (on the phone).  She underwent ORIF of the fx and a reverse total shoulder on 07/20/2016.  She had 24 hour/day care until this week and it was cut back on this week.  She admits that she still has been walking.  Getting choked on green tea/liquids.  "I'm having more laughter than I  should."  12/10/16 update:  Patient presents today for mobility evaluation.  She is accompanied by her caregiver.  A representative from advanced home care is also present.  The patient remains on carbidopa/levodopa 25/100, 2/2/1.  She did have a swallow study on 10/26/2016 that demonstrated mild oropharyngeal dysphagia, but no change the dietary recommendations were made at this time.  Regular diet with thin liquids was recommended.  Last visit and several times since, I talked  to her about the fact that I do not want her living alone and that I want her using a wheelchair at all times.  She has not been doing that and actually walked into the office today.  She states that she fell on jan 20 while trying to clean up something on the floor.  Had EMS eval her that day.  No LOC and no change in MS since then, which her caregiver agrees with.  She is moving this weekend to [facility] in [facility].  Her meds will be distributed.  She will get bathing assist.   05/23/17 update:  Patient presents for follow-up, accompanied by her sister-in-law and brother who supplements the history.  She is on carbidopa/levodopa 25/100, 2 tablets in the morning, 2 in the afternoon and one in the evening.  We did a mobility evaluation last visit and talked about the importance of staying seated at all times.  Unfortunately, she has not done that.  She presented to the hospital after a fall on 02/15/2017.  She did not hit her head.  I did get a note from Dr. Valetta Close dated 01/28/2017.  She saw him for worsening diplopia.  He recommended prisms.  They didn't help and she is supposed to see neuroopthalmology.   She moved again from [place] and is no longer in assisted living but is in retired living.  She has already had rib fx in her new place.  She has sitters at night and in the AM.  She has assistance with bathing and dressing and morning meals.    09/24/17 update: Patient seen today in follow-up for her MSA.  She is accompanied by  her sister in law who supplements the history.  I increased her carbidopa/levodopa 25/100 since her last visit so that she is now taking 2 tablets 3 times per day.  She has called a few times since last visit because of falls.  She was told that she should not be walking at all.  She is now in a motorized wheelchair, but was having difficulty with transfers and falling during the transfers.  She was told that she should not be alone for transfers.  Fortunately, she has not had any serious injuries.  She just had a recent fall.  She still has no one to help her with daytime toileting.  She is having trouble with swallowing and more coughing with food.    02/18/18 update: Patient is seen today in follow-up.  She is accompanied by her sister-in-law and brother who supplements the history.  She is on carbidopa/levodopa 25/100, 2 tablets 3 times per day.  She and her family have attended the new atypical support group.  Records have been reviewed since our last visit.  Modified barium swallow was completed on October 09, 2017 and demonstrated mild pharyngeal dysphasia.  Regular diet with thin liquids was recommended.  A GI evaluation was recommended for esophageal assessment.  She saw Dr. Halford Chessman on October 09, 2017.  Chest x-ray was ordered and that was negative.  She had pulmonary function testing in January, 2019 that was unremarkable.  She is on Flonase and Astelin and as needed Claritin.  She followed up with [facility] neurology on October 01, 2018 in regards to her sleep apnea.  CPAP download demonstrated suboptimal compliance.  She has had multiple falls since her last visit.  Reiterated many times the importance of not walking.  She has discussed with our social worker resources for higher level of care.  Having neck pain and was to have an MRI tomorrow and was considering surgery.  Her family asks about this today.  Having significant diplopia as well.  07/08/18 update: Patient is seen today in follow-up for  MSA.  She is accompanied by her friend [name] who supplements the history.  Patient is on carbidopa/levodopa 25/100, 2 tablets 3 times per day.  Records are reviewed since last visit.  Patient was in the hospital from May 17 to Apr 08, 2018.  She was found by family with mental status change.  Patient was septic due to urosepsis/pyelonephritis/urinary tract infection.  Patient required mechanical ventilation.  She recovered and went to subacute nursing facility for rehab.  She came back to the hospital on June 14 for cystoscopy and lithotripsy.  Son called since last visit about concerns about care in the subacute nursing facility, but also many questions about her disease and lifespan.  While he did talk with my Administrator, sports, it was encouraged for him to come to the appointment as we have discussed these issues with her extensively at many appointments.  She is back in the emergency room on July 16 after a fall requiring suture placement to the right forehead.  She is currently not at the subacute nursing facility.  She is in independent living.  She reports that her friend, [name], is with her most of the time during the day and a CNA is with her at night (7pm-7am).   Reports that most of the time she isn't walking "much" but is some.  Aides bathe her 3 times per week.  Swallowing is "ok" and friend [name] hasn't noted choking but she does cough a lot.  It is worse with eating.  Neck pain is worse and asks me about botox.   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  She is accompanied by her friend, [name], who supplements the history.  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  She did see [name] since our last visit regarding dysphagia.  He did not think that this sounded like esophageal  dysphagia, despite the findings on the modified barium swallow which suggested such.  He suggested repeating the modified barium swallow.  This was done on October 01, 2018.  Her swallow study remained unchanged from prior.  Outpatient speech-language pathology was recommended and GI evaluation was again recommended.  Felt that she was a moderate aspiration risk.  Diagnosis was oropharyngeal phase dysphagia.  [name] also felt that we had not discussed whether or not she would want a feeding tube (we have discussed this, and at this point she had declined, but had not brought me copies of [place]).  We had also ordered a palliative care consult, which was done on October 9, but the consult remains  the uncompleted in the chart.  Had 2 falls when sliding out of the lift chair when trying to get up.  No other falls.  Paid caregivers are there from 7pm to 7am and then friend [name] is there 7am to 7pm.  States that bladder is still working.  Caregivers transfer her to the toilet.    ALLERGIES:   Allergies  Allergen Reactions  . Aleve [Naproxen] Hives, Shortness Of Breath and Rash    Pt Avoids All Nsaids  .  Protonix [Pantoprazole Sodium]     Makes her feel wreidd  . Zocor [Simvastatin] Other (See Comments)    "weird feeling all over body"    CURRENT MEDICATIONS:  Outpatient Encounter Medications as of 10/29/2018  Medication Sig  . acetaminophen (TYLENOL) 325 MG tablet Take 650 mg by mouth 2 (two) times daily.   Marland Kitchen aspirin 81 MG chewable tablet Chew 81 mg by mouth daily.  Marland Kitchen azelastine (ASTELIN) 0.1 % nasal spray Place 1 spray into both nostrils 2 (two) times daily.   . benzonatate (TESSALON) 200 MG capsule TAKE 1 CAPSULE (200 MG) BY MOUTH 3 TIMES PER DAY WHEN NEEDED FOR COUGH  . carbidopa-levodopa (SINEMET IR) 25-100 MG tablet 2 tablets TID  . Cholecalciferol (VITAMIN D PO) Take 5,000 Units by mouth daily.  . cyanocobalamin 500 MCG tablet Take 500 mcg by mouth daily.  Marland Kitchen escitalopram (LEXAPRO) 10  MG tablet Take 10 mg by mouth at bedtime.   . fenofibrate 160 MG tablet Take 160 mg by mouth every morning.   . fluticasone (FLONASE) 50 MCG/ACT nasal spray Place 2 sprays into both nostrils daily.   Marland Kitchen loratadine (CLARITIN) 10 MG tablet Take 10 mg by mouth. Taking every other day.  . Melatonin 5 MG CAPS Take 10 mg by mouth at bedtime as needed.   . Menthol, Topical Analgesic, (BIOFREEZE EX) Apply topically.  . montelukast (SINGULAIR) 10 MG tablet Take 10 mg by mouth at bedtime.  Marland Kitchen nystatin cream (MYCOSTATIN) Apply 1 application topically daily.  Marland Kitchen olmesartan (BENICAR) 40 MG tablet Take 40 mg by mouth daily.  . traMADol (ULTRAM) 50 MG tablet Take 1 tablet (50 mg total) by mouth every 6 (six) hours as needed for moderate pain or severe pain. Post-operatively   No facility-administered encounter medications on file as of 10/29/2018.     PAST MEDICAL HISTORY:   Past Medical History:  Diagnosis Date  . Acute kidney failure (HCC)    hx of   . Arthritis    fingers,right shoulder  . Bradycardia   . 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 CHF (congestive heart failure) (HCC)    acute diastolic ( congestive ) heart failure)   . Chronic lumbar radiculopathy    L5- S1  right leg  . Constipation   . Depression   . Diverticulosis large intestine w/o perforation or abscess w/bleeding   . Dizziness   . Dry eye syndrome of bilateral lacrimal glands   . Frequency of urination   . GERD (gastroesophageal reflux disease)   . Hard of hearing   . History of kidney stones 05/12/15   surgery   . HTN (hypertension)   . Hyperlipidemia   . Hypokalemia   . Morbid obesity due to excess calories ([place])   . 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 Numbness and tingling in hands   . OSA (obstructive sleep apnea)    moderate OSA per study 11-22-2007--  refused CPAP but used oxygen for 6 months at night,  states due to wt loss stopped using  oxygen  . Polyneuropathy, diabetic ([place])    WALKS W/ CANE FOR BALANCE  . 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 Pyonephrosis   . Pyonephrosis   . Radiculopathy of lumbar region   . Respiratory failure (HCC)    hx of acute respiratory failure wtih hypoxia   .  Right ureteral stone   . Severe sepsis with septic shock (CODE) ([place])   . Shortness of breath dyspnea   . Spinal stenosis, cervical region   . SUI (stress urinary incontinence, female)   . Tubulo-interstitial nephritis   . Type 2 diabetes mellitus (HCC)    Type II - Diet controlled  . Urinary incontinence   . Urinary tract infection   . Wears glasses     PAST SURGICAL HISTORY:   Past Surgical History:  Procedure Laterality Date  . ANTERIOR CERVICAL DECOMP/DISCECTOMY FUSION N/A 03/23/2016   Procedure: Cervical Four-Five, Cervical Five-Six, Cervical Six-Seven Anterior cervical decompression/diskectomy/fusion;  Surgeon: Leeroy Cha, MD;  Location: [facility];  Service: Neurosurgery;  Laterality: N/A;  C4-5 C5-6 C6-7 Anterior cervical decompression/diskectomy/fusion  . CARDIOVASCULAR STRESS TEST  09-30-2007     normal Adenosine study/  no ischemia /  normal LV function and wall motion , ef 82%  . COLONOSCOPY    . CYSTOSCOPY WITH RETROGRADE PYELOGRAM, URETEROSCOPY AND STENT PLACEMENT Right 05/12/2015   Procedure: CYSTOSCOPY WITH RETROGRADE PYELOGRAM, URETEROSCOPY,STONE EXTRACTION AND STENT PLACEMENT;  Surgeon: Franchot Gallo, MD;  Location: [facility];  Service: Urology;  Laterality: Right;  . CYSTOSCOPY WITH RETROGRADE PYELOGRAM, URETEROSCOPY AND STENT PLACEMENT Right 04/25/2018   Procedure: CYSTOSCOPY WITH RETROGRADE PYELOGRAM, URETEROSCOPY AND STENT EXCHANGE;  Surgeon: Alexis Frock, MD;  Location: [facility];  Service: Urology;  Laterality: Right;  . CYSTOSCOPY WITH STENT PLACEMENT Right 03/28/2018   Procedure: CYSTOSCOPY WITH STENT PLACEMENT retrograde  pylegram;  Surgeon: Alexis Frock, MD;  Location: [facility];  Service: Urology;  Laterality: Right;  . EXTRACORPOREAL SHOCK WAVE LITHOTRIPSY Right 08-18-2012  . HOLMIUM LASER APPLICATION Right 5/64/3329   Procedure: HOLMIUM LASER APPLICATION;  Surgeon: Franchot Gallo, MD;  Location: [facility];  Service: Urology;  Laterality: Right;  . HOLMIUM LASER APPLICATION Right 03/30/8415   Procedure: HOLMIUM LASER APPLICATION;  Surgeon: Alexis Frock, MD;  Location: [facility];  Service: Urology;  Laterality: Right;  . ORIF HUMERUS FRACTURE Right 07/20/2016   Procedure: OPEN REDUCTION INTERNAL FIXATION (ORIF) PROXIMAL HUMERUS FRACTURE VS REVERSE TOTAL SHOULDER ARTHROPLASTY;  Surgeon: Netta Cedars, MD;  Location: [facility];  Service: Orthopedics;  Laterality: Right;  . SHOULDER ARTHROSCOPY Right 07/2016  . TRANSTHORACIC ECHOCARDIOGRAM  09-23-2007   pseudonormal LV filling pattern,  ef 65-70%/  trivial AR/  mild LAE  . TUBAL LIGATION  1985    SOCIAL HISTORY:   Social History   Socioeconomic History  . Marital status: Widowed    Spouse name: Not on file  . Number of children: 1  . Years of education: College  . Highest education level: Not on file  Occupational History  . Occupation: Therapist, sports: [employer]  . Financial resource strain: Not on file  . Food insecurity:    Worry: Not on file    Inability: Not on file  . Transportation needs:    Medical: Not on file    Non-medical: Not on file  Tobacco Use  . Smoking status: Never Smoker  . Smokeless tobacco: Never Used  Substance and Sexual Activity  . Alcohol use: Yes    Alcohol/week: 0.0 standard drinks    Comment: 3- 4 times a year  . Drug use: No  . Sexual activity: Not on file  Lifestyle  . Physical activity:    Days per week: Not on file    Minutes per session: Not on file  . Stress: Not  on file  Relationships  . Social connections:    Talks on phone: Not on file    Gets together:  Not on file    Attends religious service: Not on file    Active member of club or organization: Not on file    Attends meetings of clubs or organizations: Not on file    Relationship status: Not on file  . Intimate partner violence:    Fear of current or ex partner: Not on file    Emotionally abused: Not on file    Physically abused: Not on file    Forced sexual activity: Not on file  Other Topics Concern  . Not on file  Social History Narrative   Patient lives at home with her dog name "Hot Rod".   Caffeine Use: 2-3 cups of coffee a day     FAMILY HISTORY:   Family Status  Relation Name Status  . Father  Deceased at age 43       heart disease  . Mother  Deceased at age 73       stroke  . Sister  Alive       asthma  . Sister  Deceased       breast cancer  . Sister  Deceased       breast cancer  . Unknown  (Not Specified)  . Sister  (Not Specified)    ROS:  Review of Systems  Constitutional: Positive for malaise/fatigue.  HENT: Negative.   Respiratory: Positive for cough (some but better).   Musculoskeletal: Positive for neck pain.  Skin: Negative.   Endo/Heme/Allergies: Negative.     PHYSICAL EXAMINATION:    VITALS:   Vitals:   10/29/18 0856  BP: 108/70  Pulse: 72     GEN:  The patient appears stated age and is in NAD. HEENT:  Normocephalic, atraumatic.   The mucous membranes are very dry. The superficial temporal arteries are without ropiness or tenderness.  No fasciculations in tongue. CV:  RRR Lungs:  CTAB Neck/HEME:  There are no carotid bruits bilaterally.  Patient with mild cervical dystonia.  Head left, side bent right  Neurological examination:  Orientation: The patient is alert and oriented x3.  Cranial nerves: There is good facial symmetry with the exception of mild L ptosis.  She has complete upgaze and downgaze paresis.  She has difficulty with smooth pursuit.  She has square wave jerks.   She often times purposely closes the right eye to be  able to see without diplopia.  The visual fields are full to confrontational testing. The speech is fluent and significantly dysarthric.  Has pseudobulbar speech. Some trouble with the gutteral sounds.  Soft palate rises symmetrically and there is no tongue deviation. Hearing is intact to conversational tone. Sensation: Sensation is intact to light touch throughout Motor: Strength is 5/5 in the bilateral upper and lower extremities.   Shoulder shrug is equal and symmetric.  There is no pronator drift.    Movement examination: Tone: There is normal tone in the bilateral upper extremities.  The tone in the lower extremities is normal.  Abnormal movements: There is no tremor or dyskinesia today.  There is abnormal posture of the head with right head tilt and the right ear approximates the right shoulder Coordination:  There is decremation with RAM's, seen with virtually all forms of RAMs on the right, especially toe taps.   Gait and Station: The patient was not ambulated today as she is in a  wheelchair and I do not want her to be ambulating any longer  ASSESSMENT/PLAN:  1.  Progressive Supranuclear Palsy  - Discussed in detail this diagnosis with the patient and her friend.  Told her if wanted son involved with med care, needs to come to visit  -Discussed with her her current living situation yet again. She needs 24 hour/day care.  -Continue carbidopa/levodopa 25/100, 2 tablets 3 times per day.  Told her to change to change dosing to 7am/11am/4pm.  -Many, many discussions about the fact that she should not be walking and needs to stay seated in a wheelchair or transport chair at all times.  She reports that she is doing this now.  She has had multiple falls with fairly significant consequences.  -Advance care directives was again discussed with patient.  We have asked her several times to bring those with her.  She has expressed that she wants to be DNR, but after expressing those wishes, she ended up  in the hospital and did end up on the ventilator.  She has had a palliative care consult and they also indicated DNR status.  She reaffirmed DNR status today.   Doesn't want ventilator.  Doesn't want feeding tube.   2.  Cervical dystonia  -Had her first Botox on September 05, 2018 and states that "it only worked for a few days."  We decided to d/c that. 3.  Dysphagia and cough  -Modified barium swallow was completed on October 01, 2018.  There was evidence of oropharyngeal dysphagia.  Speech therapy was recommended and ordered by [name].  GI consult was recommended, and she had that done, but they did not feel that her dysphagia was an esophageal issue.  -talked about referral to pulmonary as many patients need vest but she wants to hold for now.  She was agreeable to thinking about this and discussing at next visit again. 4.  Mild B12 deficiency  -is on B12 supplement 1050mcg daily 5.  Constipation  -given rancho recipe at previous visits and is managing this well for now 6.  Pseudobulbar laughter  -discussed neudexta and she wishes to hold on this for now. 7.  Obstructive sleep apnea syndrome  -Following with [facility] neurology.  Not using now as left mask at hospital.  Told her to f/u with home care company and [facility] 8.  Follow up is anticipated in the next few months, sooner should new neurologic issues arise. Much greater than 50% of this visit was spent in counseling (including end of life care discussion) and coordinating care.  Total face to face time:  19min

## 2018-10-29 ENCOUNTER — Encounter: Payer: Self-pay | Admitting: Neurology

## 2018-10-29 ENCOUNTER — Ambulatory Visit (INDEPENDENT_AMBULATORY_CARE_PROVIDER_SITE_OTHER): Payer: PPO | Admitting: Neurology

## 2018-10-29 VITALS — BP 108/70 | HR 72

## 2018-10-29 DIAGNOSIS — G231 Progressive supranuclear ophthalmoplegia [Steele-Richardson-Olszewski]: Secondary | ICD-10-CM

## 2018-10-29 DIAGNOSIS — R1319 Other dysphagia: Secondary | ICD-10-CM | POA: Diagnosis not present

## 2018-10-29 DIAGNOSIS — G243 Spasmodic torticollis: Secondary | ICD-10-CM | POA: Diagnosis not present

## 2018-10-29 NOTE — Patient Instructions (Addendum)
Take carbidopa/levodopa 25/100, 2 tablets 3 times per day.   change dosing timing to 7am/11am/4pm.

## 2018-11-10 ENCOUNTER — Other Ambulatory Visit: Payer: Self-pay

## 2018-11-10 ENCOUNTER — Telehealth: Payer: Self-pay | Admitting: Gastroenterology

## 2018-11-10 DIAGNOSIS — R1310 Dysphagia, unspecified: Secondary | ICD-10-CM

## 2018-11-10 NOTE — Telephone Encounter (Signed)
Pt wants to schedule speech therapy, pls call her.

## 2018-11-10 NOTE — Telephone Encounter (Signed)
New referral placed in epic. Pt instructed to call back if she does not hear anything by the end of the day. Pt also given the phone number [phone] to call if has not heard anything regarding scheduling.

## 2018-11-17 ENCOUNTER — Other Ambulatory Visit: Payer: Self-pay

## 2018-11-17 ENCOUNTER — Ambulatory Visit: Payer: PPO | Attending: Gastroenterology | Admitting: Speech Pathology

## 2018-11-17 DIAGNOSIS — E782 Mixed hyperlipidemia: Secondary | ICD-10-CM | POA: Diagnosis not present

## 2018-11-17 DIAGNOSIS — R471 Dysarthria and anarthria: Secondary | ICD-10-CM | POA: Diagnosis not present

## 2018-11-17 DIAGNOSIS — G1220 Motor neuron disease, unspecified: Secondary | ICD-10-CM | POA: Diagnosis not present

## 2018-11-17 DIAGNOSIS — R05 Cough: Secondary | ICD-10-CM | POA: Insufficient documentation

## 2018-11-17 DIAGNOSIS — R41841 Cognitive communication deficit: Secondary | ICD-10-CM | POA: Diagnosis not present

## 2018-11-17 DIAGNOSIS — E1140 Type 2 diabetes mellitus with diabetic neuropathy, unspecified: Secondary | ICD-10-CM | POA: Diagnosis not present

## 2018-11-17 DIAGNOSIS — M170 Bilateral primary osteoarthritis of knee: Secondary | ICD-10-CM | POA: Diagnosis not present

## 2018-11-17 DIAGNOSIS — I10 Essential (primary) hypertension: Secondary | ICD-10-CM | POA: Diagnosis not present

## 2018-11-17 DIAGNOSIS — R053 Chronic cough: Secondary | ICD-10-CM

## 2018-11-17 DIAGNOSIS — R1312 Dysphagia, oropharyngeal phase: Secondary | ICD-10-CM | POA: Insufficient documentation

## 2018-11-17 DIAGNOSIS — Z6835 Body mass index (BMI) 35.0-35.9, adult: Secondary | ICD-10-CM | POA: Diagnosis not present

## 2018-11-17 NOTE — Patient Instructions (Signed)
Signs of Aspiration Pneumonia   . Chest pain/tightness . Fever (can be low grade) . Cough  o With foul-smelling phlegm (sputum) o With sputum containing pus or blood o With greenish sputum . Fatigue  . Shortness of breath  . Wheezing   **IF YOU HAVE THESE SIGNS, CONTACT YOUR DOCTOR OR GO TO THE EMERGENCY DEPARTMENT OR URGENT CARE AS SOON AS POSSIBLE**    You are at risk for developing aspiration pneumonia. You can minimize your risk by following the swallow precautions that were recommended after your swallow test (see below). Maintaining good oral care (2-3 times per day) is a good way to help prevent aspiration pneumonia, as I suspect you may be aspirating your saliva at times. Brush your teeth and all surfaces of your mouth, and use your water pick.   Swallow Precautions:   1.Take small bites and sips (one sip at a time) 2. Go slow and use extra caution with mixed consistencies (like cereal with milk or thin/chunky soups) 3. Take pills one at a time with thin or in puree, 4. Sit up during meals and at for least 30 minutes after eating or drinking

## 2018-11-18 NOTE — Therapy (Signed)
[facility] [address] [place], [place], [address] Phone: [phone]   Fax:  [phone]  Speech Language Pathology Evaluation  Patient Details  Name: [name] [name] MRN: [id number] Date of Birth: 1951/06/07 Referring Provider (SLP): Doran Stabler, MD   Encounter Date: 11/17/2018  End of Session - 11/18/18 1553    Visit Number  1    Number of Visits  13    Date for SLP Re-Evaluation  02/16/19    Authorization Type  Medicare/[employer] Advantage    SLP Start Time  7106    SLP Stop Time   1015    SLP Time Calculation (min)  50 min    Activity Tolerance  Patient tolerated treatment well       Past Medical History:  Diagnosis Date  . Acute kidney failure (HCC)    hx of   . Arthritis    fingers,right shoulder  . Bradycardia   . 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 CHF (congestive heart failure) (HCC)    acute diastolic ( congestive ) heart failure)   . Chronic lumbar radiculopathy    L5- S1  right leg  . Constipation   . Depression   . Diverticulosis large intestine w/o perforation or abscess w/bleeding   . Dizziness   . Dry eye syndrome of bilateral lacrimal glands   . Frequency of urination   . GERD (gastroesophageal reflux disease)   . Hard of hearing   . History of kidney stones 05/12/15   surgery   . HTN (hypertension)   . Hyperlipidemia   . Hypokalemia   . Morbid obesity due to excess calories ([place])   . 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 Numbness and tingling in hands   . OSA (obstructive sleep apnea)    moderate OSA per study 11-22-2007--  refused CPAP but used oxygen for 6 months at night,  states due to wt loss stopped using oxygen  . Polyneuropathy, diabetic ([place])    WALKS W/ CANE FOR BALANCE  . 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)   . Pyonephrosis   . Pyonephrosis    . Radiculopathy of lumbar region   . Respiratory failure (HCC)    hx of acute respiratory failure wtih hypoxia   . Right ureteral stone   . Severe sepsis with septic shock (CODE) ([place])   . Shortness of breath dyspnea   . Spinal stenosis, cervical region   . SUI (stress urinary incontinence, female)   . Tubulo-interstitial nephritis   . Type 2 diabetes mellitus (HCC)    Type II - Diet controlled  . Urinary incontinence   . Urinary tract infection   . Wears glasses     Past Surgical History:  Procedure Laterality Date  . ANTERIOR CERVICAL DECOMP/DISCECTOMY FUSION N/A 03/23/2016   Procedure: Cervical Four-Five, Cervical Five-Six, Cervical Six-Seven Anterior cervical decompression/diskectomy/fusion;  Surgeon: Leeroy Cha, MD;  Location: [facility];  Service: Neurosurgery;  Laterality: N/A;  C4-5 C5-6 C6-7 Anterior cervical decompression/diskectomy/fusion  . CARDIOVASCULAR STRESS TEST  09-30-2007     normal Adenosine study/  no ischemia /  normal LV function and wall motion , ef 82%  . COLONOSCOPY    . CYSTOSCOPY WITH RETROGRADE PYELOGRAM, URETEROSCOPY AND STENT PLACEMENT Right 05/12/2015   Procedure: CYSTOSCOPY WITH RETROGRADE PYELOGRAM, URETEROSCOPY,STONE EXTRACTION AND STENT PLACEMENT;  Surgeon: Franchot Gallo, MD;  Location: [facility];  Service: Urology;  Laterality: Right;  . CYSTOSCOPY WITH RETROGRADE PYELOGRAM, URETEROSCOPY AND STENT PLACEMENT Right 04/25/2018   Procedure: CYSTOSCOPY WITH RETROGRADE PYELOGRAM, URETEROSCOPY AND STENT EXCHANGE;  Surgeon: Alexis Frock, MD;  Location: [facility];  Service: Urology;  Laterality: Right;  . CYSTOSCOPY WITH STENT PLACEMENT Right 03/28/2018   Procedure: CYSTOSCOPY WITH STENT PLACEMENT retrograde pylegram;  Surgeon: Alexis Frock, MD;  Location: [facility];  Service: Urology;  Laterality: Right;  . EXTRACORPOREAL SHOCK WAVE LITHOTRIPSY Right 08-18-2012  . HOLMIUM LASER APPLICATION Right 1/61/0960   Procedure: HOLMIUM LASER  APPLICATION;  Surgeon: Franchot Gallo, MD;  Location: [facility];  Service: Urology;  Laterality: Right;  . HOLMIUM LASER APPLICATION Right 4/54/0981   Procedure: HOLMIUM LASER APPLICATION;  Surgeon: Alexis Frock, MD;  Location: [facility];  Service: Urology;  Laterality: Right;  . ORIF HUMERUS FRACTURE Right 07/20/2016   Procedure: OPEN REDUCTION INTERNAL FIXATION (ORIF) PROXIMAL HUMERUS FRACTURE VS REVERSE TOTAL SHOULDER ARTHROPLASTY;  Surgeon: Netta Cedars, MD;  Location: [facility];  Service: Orthopedics;  Laterality: Right;  . SHOULDER ARTHROSCOPY Right 07/2016  . TRANSTHORACIC ECHOCARDIOGRAM  09-23-2007   pseudonormal LV filling pattern,  ef 65-70%/  trivial AR/  mild LAE  . TUBAL LIGATION  1985    There were no vitals filed for this visit.  Subjective Assessment - 11/17/18 0931    Subjective  Pt reports she is doing well with swallowing (response unintelligible, clarified by friend)    Patient is accompained by:  Family member   friend [name]   Currently in Pain?  Yes    Pain Score  2     Pain Location  Neck    Pain Orientation  Right;Left         SLP Evaluation [facility] - 11/17/18 0931      SLP Visit Information   SLP Received On  11/17/18      Subjective   Patient/Family Stated Goal  "I guess to talk where people understand me"      General Information   HPI  [name] [name] is a 68 y.o. female with PMH significant for DM II, OSA on CPAP, obesity, chronic cough, CHF, ACDF (2017), progressive supranuclear palsy, GERD, and dysphagia. Neurologist note indicated pt + tremors, speech difficulty, and weakness. Per MD note 09/24/18, pt's dysphagia has worsened over last year and complains of frequent coughing during PO intake (both solids and liquids). Pt reported consuming thickened liquids (reccomended by her [place] night nurse) prior to [facility] on 10/01/18. MBS revealed mild sensory-based oropharyngeal dysphagia, delayed swallow at pyriform sinuses with thin liquid, one instance  of penetration above the vocal cords when consuming large consecutive sips of thin liquid from a straw.     Behavioral/Cognition  alert, cooperative    Mobility Status  uses a wheelchair      Balance Screen   Has the patient fallen in the past 6 months  Yes    How many times?  several    Has the patient had a decrease in activity level because of a fear of falling?   No    Is the patient reluctant to leave their home because of a fear of falling?   No      Prior Functional Status   Cognitive/Linguistic Baseline  Baseline deficits    Baseline deficit details  memory and "thinking" per pt   24 hour caregiver assists with meds    Lives With  Other (Comment)   caregiver   Available Support  Friend(s);Personal care attendant    Vocation  Retired      Associate Professor   Overall Cognitive Status  History of cognitive impairments - at baseline   not formally assessed today     Auditory Comprehension   Overall Auditory Comprehension  Appears within functional limits for tasks assessed      Visual Recognition/Discrimination   Discrimination  Not tested      Reading Comprehension   Reading Status  Not tested      Expression   Primary Mode of Expression  Verbal      Verbal Expression   Overall Verbal Expression  Appears within functional limits for tasks assessed      Written Expression   Dominant Hand  Left    Written Expression  Not tested      Oral Motor/Sensory Function   Overall Oral Motor/Sensory Function  Impaired    Labial ROM  Reduced right;Reduced left    Labial Symmetry  Within Functional Limits    Labial Strength  Within Functional Limits    Labial Coordination  Reduced    Lingual ROM  Reduced right;Reduced left    Lingual Symmetry  Within Functional Limits    Lingual Strength  Reduced    Lingual Coordination  Reduced    Facial ROM  Reduced right;Reduced left    Mandible  Within Functional Limits      Motor Speech   Overall Motor Speech  Impaired    Respiration   Impaired    Level of Impairment  Sentence    Phonation  Other (comment)   strained/strangled   Resonance  Within functional limits    Articulation  Impaired    Level of Impairment  Phrase    Intelligibility  Intelligibility reduced    Sentence  75-100% accurate   85%   Conversation  75-100% accurate   80%   Motor Planning  Witnin functional limits    Effective Techniques  Slow rate;Pause;Pacing    Phonation  Impaired    Tension Present  Neck    Volume  --   Variable   Pitch  Low      Standardized Assessments   Standardized Assessments   Other Assessment     Voice Related Quality of Life (VRQOL) score is 35 (>80 is WNL). Pt complains of frequent requests to repeat herself, communication breakdowns with her sister, friends and caregivers, and difficulty using the phone. She often feels frustrated by this. Habitual pitch is 155 Hz, low for age/gender. Vocal quality is strained and strangled. Pt has frequent bursts of short rushes of speech, with fading vocal intensity and hypoarticulation, which degrade intelligibility.      SLP Education - 11/18/18 1526    Education Details  swallow precautions, aspiration PNA    Person(s) Educated  Patient;Other (comment)   friend   Methods  Explanation;Handout    Comprehension  Verbalized understanding;Need further instruction       SLP Short Term Goals - 11/18/18 1515      SLP SHORT TERM GOAL #1   Title  Pt will demonstrate carryover of swallowing precautions with regular solids, thin liquids x2 sessions with rare min A.     Time  3    Period  Weeks    Status  New      SLP SHORT TERM GOAL #2   Title  Pt will ID short rushes of speech in 18/20 sentence responses x3 sessions.  Time  3      SLP SHORT TERM GOAL #3   Title  Pt will use compensations for dysarthria in 8 minutes simple conversation x 2 sessions for >90% intelligibility, with occasional min A.    Time  3    Period  Weeks    Status  New       SLP Long Term Goals -  11/18/18 1517      SLP LONG TERM GOAL #1   Title  Pt will demo swallow compensations independently with regular solids, thin liquids x2 sessions.     Time  6    Period  Weeks    Status  New      SLP LONG TERM GOAL #2   Title  Pt will tell SLP 3 signs of aspiration PNA.    Time  6    Period  Weeks    Status  New      SLP LONG TERM GOAL #3   Title  Pt will report fewer requests to repeat herself/need for assistance to relay personal information (banking details) on community outings than prior to [place].    Time  6    Period  Weeks    Status  New      SLP LONG TERM GOAL #4   Title  Pt will demo awareness of short rushes of speech by attempting correction in 4/5 opportunities x 3 sessions.      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 Speech is characterized by predominantly spastic and hypokinetic features: strained, strangled vocal quality, imprecise articulation, short rushes of speech, monopitch, and loudness decay with longer utterances. Pt also noted to cough, presumably on her saliva (although chronic cough noted in history), throughout assessment. Pt reports she is tolerating regular diet with precautions, however she was noted to take large, consecutive sips when given a cup of water. Her primary concern is her communication; she reports she has difficulty communicating in person with friends and caregiver, on the phone, and when running errands in the community. I recommend skilled ST for training in aspiration precautions, swallow compensations, strategies and compensations for dysarthria in order to improve swallowing safety,  intelligibility and quality of life.     Consider OT/PT referrals as pt reports falling out of her chair, lift several times   Speech Therapy Frequency  2x / week    Duration  --   6 weeks or 13 total visits   Treatment/Interventions  Aspiration precaution training;Diet toleration management by SLP;Cognitive reorganization;Compensatory strategies;Patient/family education;SLP instruction and feedback;Functional tasks;Internal/external aids;Cueing hierarchy;Compensatory techniques    Potential to Achieve Goals  Good    SLP Home Exercise Plan  swallow compensations, aspiration PNA education provided    Consulted and Agree with Plan of Care  Patient   friend      Patient will benefit from skilled therapeutic intervention in order to improve the following deficits and impairments:   Dysarthria and anarthria  Dysphagia, oropharyngeal phase  Cognitive communication deficit  Chronic cough    Problem List Patient Active Problem List   Diagnosis Date Noted  . Bradycardia 04/01/2018  . UTI (urinary tract infection) 04/01/2018  .  ARF (acute renal failure) ([place]) 04/01/2018  . Right ureteral stone 04/01/2018  . Acute diastolic heart failure ([place]) 04/01/2018  . Acute respiratory failure with hypoxia ([place])   . Pyelonephritis   . Severe sepsis ([place])   . Pyohydronephrosis 03/29/2018  . Septic shock ([place]) 03/29/2018  . Hypokalemia 03/28/2018  . Severe sepsis with septic shock ([place]) 03/28/2018  . Elevated lactic acid level 03/28/2018  . Elevated troponin 03/28/2018  . PSP (progressive supranuclear palsy) ([place]) 02/18/2018  . Chronic cough 11/19/2017  . 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 Cervical stenosis of spinal canal 03/23/2016  . OSA on CPAP 12/14/2015  . Dizziness and giddiness 11/24/2013  . Diabetes type 2, uncontrolled ([place]) 11/24/2013  . Severe obesity (BMI >= 40) ([place]) 11/24/2013  . Lumbar radiculopathy 11/24/2013   [name] [name], [place], [place] 11/18/2018, 3:54 PM  [facility] [address] [place], [place], [address] Phone: [phone]   Fax:  [phone]  Name: [name] [name] MRN: [id number] Date of Birth: 02/20/1951

## 2018-11-19 DIAGNOSIS — Z124 Encounter for screening for malignant neoplasm of cervix: Secondary | ICD-10-CM | POA: Diagnosis not present

## 2018-11-19 DIAGNOSIS — Z1231 Encounter for screening mammogram for malignant neoplasm of breast: Secondary | ICD-10-CM | POA: Diagnosis not present

## 2018-11-20 ENCOUNTER — Ambulatory Visit: Payer: PPO | Admitting: Speech Pathology

## 2018-11-20 DIAGNOSIS — R471 Dysarthria and anarthria: Secondary | ICD-10-CM | POA: Diagnosis not present

## 2018-11-20 DIAGNOSIS — R1312 Dysphagia, oropharyngeal phase: Secondary | ICD-10-CM

## 2018-11-20 NOTE — Patient Instructions (Signed)
S   Slow L   Loud O  Overarticulate P   Pause  [employer] Tuesday Salmon and broccoli Mashed potatoes Water to drink Decaf coffee Mace[employer] Pro Squash Potato Salad Peach Cobbler Pasta More breadsticks, please Barbecue hushpuppies White slaw Beef tips Sweet potato casserole Can you bring me something from the store? Pick up my medications from the drug store Can you put me in bed Will you bring me back from the dining room David's leaving  Can you come over at 2:00? How are you? How's [name] [name]? How's [name] [name] doing? I love you.  I miss you.  It's time to get up.

## 2018-11-20 NOTE — Therapy (Signed)
[facility] [address] [place], [place], [address] Phone: [phone]   Fax:  [phone]  Speech Language Pathology Treatment  Patient Details  Name: [name] [name] MRN: [id number] Date of Birth: [birth year] Referring Provider (SLP): Doran Stabler, MD   Encounter Date: 11/20/2018  End of Session - 11/20/18 1306    Visit Number  2    Number of Visits  13    Date for SLP Re-Evaluation  02/16/19    Authorization Type  Medicare/HT Advantage    SLP Start Time  23    SLP Stop Time   1059    SLP Time Calculation (min)  40 min    Activity Tolerance  Patient tolerated treatment well       Past Medical History:  Diagnosis Date  . Acute kidney failure (HCC)    hx of   . Arthritis    fingers,right shoulder  . Bradycardia   . 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 CHF (congestive heart failure) (HCC)    acute diastolic ( congestive ) heart failure)   . Chronic lumbar radiculopathy    L5- S1  right leg  . Constipation   . Depression   . Diverticulosis large intestine w/o perforation or abscess w/bleeding   . Dizziness   . Dry eye syndrome of bilateral lacrimal glands   . Frequency of urination   . GERD (gastroesophageal reflux disease)   . Hard of hearing   . History of kidney stones 05/12/15   surgery   . HTN (hypertension)   . Hyperlipidemia   . Hypokalemia   . Morbid obesity due to excess calories ([place])   . 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 Numbness and tingling in hands   . OSA (obstructive sleep apnea)    moderate OSA per study 11-22-2007--  refused CPAP but used oxygen for 6 months at night,  states due to wt loss stopped using oxygen  . Polyneuropathy, diabetic ([place])    WALKS W/ CANE FOR BALANCE  . 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 Pyonephrosis   . Pyonephrosis    . Radiculopathy of lumbar region   . Respiratory failure (HCC)    hx of acute respiratory failure wtih hypoxia   . Right ureteral stone   . Severe sepsis with septic shock (CODE) ([place])   . Shortness of breath dyspnea   . Spinal stenosis, cervical region   . SUI (stress urinary incontinence, female)   . Tubulo-interstitial nephritis   . Type 2 diabetes mellitus (HCC)    Type II - Diet controlled  . Urinary incontinence   . Urinary tract infection   . Wears glasses     Past Surgical History:  Procedure Laterality Date  . ANTERIOR CERVICAL DECOMP/DISCECTOMY FUSION N/A 03/23/2016   Procedure: Cervical Four-Five, Cervical Five-Six, Cervical Six-Seven Anterior cervical decompression/diskectomy/fusion;  Surgeon: Leeroy Cha, MD;  Location: [facility];  Service: Neurosurgery;  Laterality: N/A;  C4-5 C5-6 C6-7 Anterior cervical decompression/diskectomy/fusion  . CARDIOVASCULAR STRESS TEST  09-30-2007     normal Adenosine study/  no ischemia /  normal LV function and wall motion , ef 82%  . COLONOSCOPY    . CYSTOSCOPY WITH RETROGRADE PYELOGRAM, URETEROSCOPY AND STENT PLACEMENT Right 05/12/2015   Procedure: CYSTOSCOPY WITH RETROGRADE PYELOGRAM, URETEROSCOPY,STONE EXTRACTION AND STENT PLACEMENT;  Surgeon: Franchot Gallo, MD;  Location: [facility];  Service: Urology;  Laterality: Right;  . CYSTOSCOPY WITH RETROGRADE PYELOGRAM, URETEROSCOPY AND STENT PLACEMENT Right 04/25/2018   Procedure: CYSTOSCOPY WITH RETROGRADE PYELOGRAM, URETEROSCOPY AND STENT EXCHANGE;  Surgeon: Alexis Frock, MD;  Location: [facility];  Service: Urology;  Laterality: Right;  . CYSTOSCOPY WITH STENT PLACEMENT Right 03/28/2018   Procedure: CYSTOSCOPY WITH STENT PLACEMENT retrograde pylegram;  Surgeon: Alexis Frock, MD;  Location: [facility];  Service: Urology;  Laterality: Right;  . EXTRACORPOREAL SHOCK WAVE LITHOTRIPSY Right 08-18-2012  . HOLMIUM LASER APPLICATION Right 02/02/5572   Procedure: HOLMIUM LASER  APPLICATION;  Surgeon: Franchot Gallo, MD;  Location: [facility];  Service: Urology;  Laterality: Right;  . HOLMIUM LASER APPLICATION Right 01/01/2541   Procedure: HOLMIUM LASER APPLICATION;  Surgeon: Alexis Frock, MD;  [facility];  Service: Urology;  Laterality: Right;  . ORIF HUMERUS FRACTURE Right 07/20/2016   Procedure: OPEN REDUCTION INTERNAL FIXATION (ORIF) PROXIMAL HUMERUS FRACTURE VS REVERSE TOTAL SHOULDER ARTHROPLASTY;  Surgeon: Netta Cedars, MD;  Location: [facility];  Service: Orthopedics;  Laterality: Right;  . SHOULDER ARTHROSCOPY Right 07/2016  . TRANSTHORACIC ECHOCARDIOGRAM  09-23-2007   pseudonormal LV filling pattern,  ef 65-70%/  trivial AR/  mild LAE  . TUBAL LIGATION  1985    There were no vitals filed for this visit.  Subjective Assessment - 11/20/18 1023    Subjective  Pt reports she reviewed handouts at home.    Patient is accompained by:  --   friend [name] [name]   Currently in Pain?  Yes    Pain Score  2     Pain Location  Neck            ADULT SLP TREATMENT - 11/20/18 0001      General Information   Behavior/Cognition  Alert;Cooperative    Patient Positioning  Upright in chair    HPI   [name] [name] is a 68 y.o. female with PMH significant for DM II, OSA on CPAP, obesity, chronic cough, CHF, ACDF (2017), progressive supranuclear palsy, GERD, and dysphagia. Neurologist note indicated pt + tremors, speech difficulty, and weakness. Per MD note 09/24/18, pt's dysphagia has worsened over last year and complains of frequent coughing during PO intake (both solids and liquids). Pt reported consuming thickened liquids (reccomended by her [facility] night nurse) prior to [facility] on 10/01/18. MBS revealed mild sensory-based oropharyngeal dysphagia, delayed swallow at pyriform sinuses with thin liquid, one instance of penetration above the vocal cords when consuming large consecutive sips of thin liquid from a straw.      Treatment Provided   Treatment provided   Cognitive-Linquistic;Dysphagia      Dysphagia Treatment   Temperature Spikes Noted  No    Respiratory Status  Room air    Oral Cavity - Dentition  Adequate natural dentition    Treatment Methods  Compensation strategy training;Patient/caregiver education    Patient observed directly with PO's  No    Type of cueing  Verbal    Amount of cueing  Moderate    Other treatment/comments  Pt recalled 2 signs of aspiration PNA and 1 swallow precaution (small bites/sips). Usual question cues required for remaining recommendations. SLP educated re: dysphagia goals and need to monitor for decline in swallow function, signs of aspiration PNA.       Pain Assessment   Pain Assessment  No/denies pain      Cognitive-Linquistic Treatment   Treatment focused on  Dysarthria  Skilled Treatment  Pt with rapid rate of speech and low intelligibility outside of short responses in context. SLP trained in compensations for dysarthria, modeling specifically slow rate and pausing in conversational speech. Pt generated list of personally relevant words/phrases, with usual cues for slower rate, requests for repetition required 50% of the time to clarify portions of utterance. SLP then worked with pt on using compensations with her list of words/phrases. Initial mod cues for rate, pausing, and renewing breath between list items. SLP gradually able to fade cues, and when pt reached longer tasks, she appropriately identified locations necessary for breath renewal. In simple conversation, usual mod-max A (imitation) required for carryover of strategies.      Assessment / Recommendations / Plan   Plan  Continue with current plan of care      Progression Toward Goals   Progression toward goals  Progressing toward goals       SLP Education - 11/20/18 1306    Education Details  compensations for dysarthria    Person(s) Educated  Patient;Other (comment)   friend [name] [name]   Methods  Explanation;Handout    Comprehension   Verbalized understanding;Returned demonstration;Need further instruction       SLP Short Term Goals - 11/20/18 1308      SLP SHORT TERM GOAL #1   Title  Pt will demonstrate carryover of swallowing precautions with regular solids, thin liquids x2 sessions with rare min A.     Time  3    Period  Weeks    Status  On-going      SLP SHORT TERM GOAL #2   Title  Pt will ID short rushes of speech in 18/20 sentence responses x3 sessions.     Time  3    Period  Weeks    Status  On-going      SLP SHORT TERM GOAL #3   Title  Pt will use compensations for dysarthria in 8 minutes simple conversation x 2 sessions for >90% intelligibility, with occasional min A.    Time  3    Period  Weeks    Status  On-going       SLP Long Term Goals - 11/20/18 1308      SLP LONG TERM GOAL #1   Title  Pt will demo swallow compensations independently with regular solids, thin liquids x2 sessions.     Time  6    Period  Weeks    Status  On-going      SLP LONG TERM GOAL #2   Title  Pt will tell SLP 3 signs of aspiration PNA.    Time  6    Period  Weeks    Status  On-going      SLP LONG TERM GOAL #3   Title  Pt will report fewer requests to repeat herself/need for assistance to relay personal information (banking details) on community outings than prior to [place].    Time  6    Period  Weeks    Status  On-going      SLP LONG TERM GOAL #4   Title  Pt will demo awareness of short rushes of speech by attempting correction in 4/5 opportunities x 3 sessions.    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 Speech is characterized by predominantly spastic and hypokinetic features: strained, strangled vocal quality, imprecise articulation, short rushes of speech, monopitch, and loudness decay with longer utterances. Pt also noted to cough, presumably on her saliva (although chronic cough noted in history), throughout assessment. Pt reports she is tolerating regular diet with precautions, however she was noted to take large, consecutive sips when given a cup of water. Her primary concern is her communication; she reports she has difficulty communicating in person with friends and caregiver, on the phone, and when running errands in the community. I recommend skilled ST for training in aspiration precautions, swallow compensations, strategies and compensations for dysarthria in order to improve swallowing safety, intelligibility and quality of life.      Speech Therapy Frequency  2x / week    Duration  --   6 weeks or 13 total visits   Treatment/Interventions  Aspiration precaution training;Diet toleration management by SLP;Cognitive reorganization;Compensatory strategies;Patient/family education;SLP instruction and feedback;Functional tasks;Internal/external aids;Cueing hierarchy;Compensatory techniques    Potential to Achieve Goals  Good       Patient will benefit from skilled therapeutic intervention in order to improve the following deficits and impairments:   Dysarthria and anarthria  Dysphagia, oropharyngeal phase    Problem List Patient Active Problem List   Diagnosis Date Noted  . Bradycardia 04/01/2018  . UTI (urinary tract infection) 04/01/2018  . ARF (acute renal failure) ([place]) 04/01/2018  . Right ureteral stone 04/01/2018  . Acute diastolic heart failure ([place]) 04/01/2018  . Acute respiratory failure with hypoxia ([place])   . Pyelonephritis   . Severe sepsis ([place])   . Pyohydronephrosis 03/29/2018  . Septic shock ([place]) 03/29/2018   . Hypokalemia 03/28/2018  . Severe sepsis with septic shock ([place]) 03/28/2018  . Elevated lactic acid level 03/28/2018  . Elevated troponin 03/28/2018  . PSP (progressive supranuclear palsy) ([place]) 02/18/2018  . Chronic cough 11/19/2017  . 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 Cervical stenosis of spinal canal 03/23/2016  . OSA on CPAP 12/14/2015  . Dizziness and giddiness 11/24/2013  . Diabetes type 2, uncontrolled ([place]) 11/24/2013  . Severe obesity (BMI >= 40) ([place]) 11/24/2013  . Lumbar radiculopathy 11/24/2013   Deneise Lever, [place], [place] E [place]ala Rommel 11/20/2018, 1:09 PM  [facility] [address] [place], [place], [address] Phone: [phone]   Fax:  [phone]   Name: [name] [name] MRN: [id number] Date of Birth: 01-03-1951

## 2018-11-25 ENCOUNTER — Ambulatory Visit: Payer: PPO | Admitting: Speech Pathology

## 2018-11-25 DIAGNOSIS — R471 Dysarthria and anarthria: Secondary | ICD-10-CM

## 2018-11-25 NOTE — Therapy (Signed)
[facility] [address] [place], [place], [address] Phone: [phone]   Fax:  [phone]  Speech Language Pathology Treatment  Patient Details  Name: [name] [name] MRN: [id number] Date of Birth: 1951/10/11 Referring Provider (SLP): Doran Stabler, MD   Encounter Date: 11/25/2018  End of Session - 11/25/18 1127    Visit Number  3    Number of Visits  13    Date for SLP Re-Evaluation  02/16/19    Authorization Type  Medicare/HT Advantage    SLP Start Time  110    SLP Stop Time   1100    SLP Time Calculation (min)  41 min    Activity Tolerance  Patient tolerated treatment well       Past Medical History:  Diagnosis Date  . Acute kidney failure (HCC)    hx of   . Arthritis    fingers,right shoulder  . Bradycardia   . 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 CHF (congestive heart failure) (HCC)    acute diastolic ( congestive ) heart failure)   . Chronic lumbar radiculopathy    L5- S1  right leg  . Constipation   . Depression   . Diverticulosis large intestine w/o perforation or abscess w/bleeding   . Dizziness   . Dry eye syndrome of bilateral lacrimal glands   . Frequency of urination   . GERD (gastroesophageal reflux disease)   . Hard of hearing   . History of kidney stones 05/12/15   surgery   . HTN (hypertension)   . Hyperlipidemia   . Hypokalemia   . Morbid obesity due to excess calories ([place])   . 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 Numbness and tingling in hands   . OSA (obstructive sleep apnea)    moderate OSA per study 11-22-2007--  refused CPAP but used oxygen for 6 months at night,  states due to wt loss stopped using oxygen  . Polyneuropathy, diabetic ([place])    WALKS W/ CANE FOR BALANCE  . 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 Pyonephrosis   . Pyonephrosis    . Radiculopathy of lumbar region   . Respiratory failure (HCC)    hx of acute respiratory failure wtih hypoxia   . Right ureteral stone   . Severe sepsis with septic shock (CODE) ([place])   . Shortness of breath dyspnea   . Spinal stenosis, cervical region   . SUI (stress urinary incontinence, female)   . Tubulo-interstitial nephritis   . Type 2 diabetes mellitus (HCC)    Type II - Diet controlled  . Urinary incontinence   . Urinary tract infection   . Wears glasses     Past Surgical History:  Procedure Laterality Date  . ANTERIOR CERVICAL DECOMP/DISCECTOMY FUSION N/A 03/23/2016   Procedure: Cervical Four-Five, Cervical Five-Six, Cervical Six-Seven Anterior cervical decompression/diskectomy/fusion;  Surgeon: Leeroy Cha, MD;  Location: [facility];  Service: Neurosurgery;  Laterality: N/A;  C4-5 C5-6 C6-7 Anterior cervical decompression/diskectomy/fusion  . CARDIOVASCULAR STRESS TEST  09-30-2007     normal Adenosine study/  no ischemia /  normal LV function and wall motion , ef 82%  . COLONOSCOPY    . CYSTOSCOPY WITH RETROGRADE PYELOGRAM, URETEROSCOPY AND STENT PLACEMENT Right 05/12/2015   Procedure: CYSTOSCOPY WITH RETROGRADE PYELOGRAM, URETEROSCOPY,STONE EXTRACTION AND STENT PLACEMENT;  Surgeon: Franchot Gallo, MD;  Location: [facility];  Service: Urology;  Laterality: Right;  . CYSTOSCOPY WITH RETROGRADE PYELOGRAM, URETEROSCOPY AND STENT PLACEMENT Right 04/25/2018   Procedure: CYSTOSCOPY WITH RETROGRADE PYELOGRAM, URETEROSCOPY AND STENT EXCHANGE;  Surgeon: Alexis Frock, MD;  [facility];  Service: Urology;  Laterality: Right;  . CYSTOSCOPY WITH STENT PLACEMENT Right 03/28/2018   Procedure: CYSTOSCOPY WITH STENT PLACEMENT retrograde pylegram;  Surgeon: Alexis Frock, MD;  [facility];  Service: Urology;  Laterality: Right;  . EXTRACORPOREAL SHOCK WAVE LITHOTRIPSY Right 08-18-2012  . HOLMIUM LASER APPLICATION Right 1/66/0630   Procedure: HOLMIUM LASER  APPLICATION;  Surgeon: Franchot Gallo, MD;  Location: [facility];  Service: Urology;  Laterality: Right;  . HOLMIUM LASER APPLICATION Right 1/60/1093   Procedure: HOLMIUM LASER APPLICATION;  Surgeon: Alexis Frock, MD;  [facility];  Service: Urology;  Laterality: Right;  . ORIF HUMERUS FRACTURE Right 07/20/2016   Procedure: OPEN REDUCTION INTERNAL FIXATION (ORIF) PROXIMAL HUMERUS FRACTURE VS REVERSE TOTAL SHOULDER ARTHROPLASTY;  Surgeon: Netta Cedars, MD;  Location: [facility];  Service: Orthopedics;  Laterality: Right;  . SHOULDER ARTHROSCOPY Right 07/2016  . TRANSTHORACIC ECHOCARDIOGRAM  09-23-2007   pseudonormal LV filling pattern,  ef 65-70%/  trivial AR/  mild LAE  . TUBAL LIGATION  1985    There were no vitals filed for this visit.  Subjective Assessment - 11/25/18 1022    Subjective  "When i'm talking on the phone they get so carried away, so"    Patient is accompained by:  --   friend [name]   Currently in Pain?  Yes    Pain Score  2     Pain Location  Neck            ADULT SLP TREATMENT - 11/25/18 1020      General Information   Behavior/Cognition  Alert;Cooperative;Pleasant mood    HPI   [name] [name] is a 68 y.o. female with PMH significant for DM II, OSA on CPAP, obesity, chronic cough, CHF, ACDF (2017), progressive supranuclear palsy, GERD, and dysphagia. Neurologist note indicated pt + tremors, speech difficulty, and weakness. Per MD note 09/24/18, pt's dysphagia has worsened over last year and complains of frequent coughing during PO intake (both solids and liquids). Pt reported consuming thickened liquids (reccomended by her [facility] night nurse) prior to [facility] on 10/01/18. MBS revealed mild sensory-based oropharyngeal dysphagia, delayed swallow at pyriform sinuses with thin liquid, one instance of penetration above the vocal cords when consuming large consecutive sips of thin liquid from a straw.      Treatment Provided   Treatment provided   Cognitive-Linquistic      Cognitive-Linquistic Treatment   Treatment focused on  Dysarthria    Skilled Treatment  Pt reports practicing at home but told SLP she is still having a hard time controlling her rate in conversations and on the phone (see "S"). Pt told SLP dysarthria strategies/compensations with mod question cues. She read her personally relevant words/phrases with occasional min A for more frequent breaths. In simple spontaneous responses, pt used compensations 75% accuracy (occasional min A) for shorter response length (4-6 words), but with quickly accelerating speech with longer responses. SLP worked with pt on awareness of short rushes of speech at sentence level, by having pt generate sentences using a given word. Usual mod A and occasional imitation cues required initially, with SLP able to fade support ("was that a thumbs up or a thumbs down?") to question cues for ID  of rapid speech (90% accuracy).       Assessment / Recommendations / Plan   Plan  Continue with current plan of care      Progression Toward Goals   Progression toward goals  Progressing toward goals         SLP Short Term Goals - 11/25/18 1129      SLP SHORT TERM GOAL #1   Title  Pt will demonstrate carryover of swallowing precautions with regular solids, thin liquids x2 sessions with rare min A.     Time  2    Period  Weeks    Status  On-going      SLP SHORT TERM GOAL #2   Title  Pt will ID short rushes of speech in 18/20 sentence responses x3 sessions.     Time  2    Period  Weeks    Status  On-going      SLP SHORT TERM GOAL #3   Title  Pt will use compensations for dysarthria in 8 minutes simple conversation x 2 sessions for >90% intelligibility, with occasional min A.    Time  2    Period  Weeks    Status  On-going       SLP Long Term Goals - 11/25/18 1129      SLP LONG TERM GOAL #1   Title  Pt will demo swallow compensations independently with regular solids, thin liquids x2 sessions.      Time  5    Period  Weeks    Status  On-going      SLP LONG TERM GOAL #2   Title  Pt will tell SLP 3 signs of aspiration PNA.    Time  5    Period  Weeks    Status  On-going      SLP LONG TERM GOAL #3   Title  Pt will report fewer requests to repeat herself/need for assistance to relay personal information (banking details) on community outings than prior to [place].    Time  5    Period  Weeks    Status  On-going      SLP LONG TERM GOAL #4   Title  Pt will demo awareness of short rushes of speech by attempting correction in 4/5 opportunities x 3 sessions.    Time  5    Period  Weeks    Status  On-going      SLP LONG TERM GOAL #5   Title  Pt will use compensations for dysarthria for >95% intelligibility in 10 minutes simple-mod complex conversation x 3 visits (self-corrections allowed)    Time  5    Period  Weeks    Status  On-going       Plan - 11/25/18 1127    Clinical Impression Statement  Ms. 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 Speech is characterized by predominantly spastic and hypokinetic features: strained, strangled vocal quality, imprecise articulation, short rushes of speech, monopitch, and loudness decay with longer utterances. Pt also noted to cough frequently, presumably on her saliva (although chronic cough noted in history). Pt reports she is tolerating regular diet with precautions, however she was noted to take large, consecutive sips when given a cup of water. Her primary concern is her communication; she reports she has difficulty communicating in person with friends and caregiver, on the phone, and when running errands in the community. Awareness of rapid speech  improving at sentence level with question cues. I recommend skilled ST for training in aspiration precautions, swallow compensations, strategies and compensations for dysarthria in order to  improve swallowing safety, intelligibility and quality of life.      Speech Therapy Frequency  2x / week    Duration  --   6 weeks or 13 total visits   Treatment/Interventions  Aspiration precaution training;Diet toleration management by SLP;Cognitive reorganization;Compensatory strategies;Patient/family education;SLP instruction and feedback;Functional tasks;Internal/external aids;Cueing hierarchy;Compensatory techniques    Potential to Achieve Goals  Good       Patient will benefit from skilled therapeutic intervention in order to improve the following deficits and impairments:   Dysarthria and anarthria    Problem List Patient Active Problem List   Diagnosis Date Noted  . Bradycardia 04/01/2018  . UTI (urinary tract infection) 04/01/2018  . ARF (acute renal failure) ([place]) 04/01/2018  . Right ureteral stone 04/01/2018  . Acute diastolic heart failure ([place]) 04/01/2018  . Acute respiratory failure with hypoxia ([place])   . Pyelonephritis   . Severe sepsis ([place])   . Pyohydronephrosis 03/29/2018  . Septic shock ([place]) 03/29/2018  . Hypokalemia 03/28/2018  . Severe sepsis with septic shock ([place]) 03/28/2018  . Elevated lactic acid level 03/28/2018  . Elevated troponin 03/28/2018  . PSP (progressive supranuclear palsy) ([place]) 02/18/2018  . Chronic cough 11/19/2017  . 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 Cervical stenosis of spinal canal 03/23/2016  . OSA on CPAP 12/14/2015  . Dizziness and giddiness 11/24/2013  . Diabetes type 2, uncontrolled ([place]) 11/24/2013  . Severe obesity (BMI >= 40) ([place]) 11/24/2013  . Lumbar radiculopathy 11/24/2013   Deneise Lever, [place], [place] 11/25/2018, 11:30 AM  [facility] [address] [place], [place], [address] Phone: [phone]   Fax:  [phone]   Name: ADAIJAH ENDRES MRN: [id number] Date of Birth: June 29, 1951

## 2018-11-27 ENCOUNTER — Ambulatory Visit: Payer: PPO

## 2018-11-27 DIAGNOSIS — R41841 Cognitive communication deficit: Secondary | ICD-10-CM

## 2018-11-27 DIAGNOSIS — R471 Dysarthria and anarthria: Secondary | ICD-10-CM

## 2018-11-27 DIAGNOSIS — R1312 Dysphagia, oropharyngeal phase: Secondary | ICD-10-CM

## 2018-11-27 NOTE — Patient Instructions (Signed)
  WHAT ARE MIXED CONSISTENCY ITEMS? What is a mixed/dual consistency? These foods include both a solid and liquid component, for example:  .  Where a meal consists of foods of more than one consistency (e.g. soup with croutons, yogurts with solid pieces of fruit in it) .  When a fluid, such as juice or milk, separates from the food in the mouth to form a mixed consistency (e.g. cereal with milk, minestrone soup) .  When a food item contains a juicy centre, which is released when the food is chewed (e.g. orange segments, tomatoes, some tinned fruits)    COMPENSATING FOR MIXED CONSISTENCY ITEMS  Below are the most common compensatory strategies compiled through web-based searching and the [employer] Survey of November 2014 (in no particular order):       Draining liquid from the spoon during meals.      Use of a fork to eat mixed textures.      Thickening broth or milk.      Pureeing mixed consistencies.      Let cereal absorb milk until there is little liquid left.

## 2018-11-27 NOTE — Therapy (Signed)
[name] [name] [address] [place], [name], [address] Phone: [phone]   Fax:  [phone]  Speech Language Pathology Treatment  Patient Details  Name: [name] [name] MRN: [id number] Date of Birth: 1951-01-17 Referring Provider (SLP): Doran Stabler, MD   Encounter Date: 11/27/2018  End of Session - 11/27/18 1302    Visit Number  4    Number of Visits  13    Date for SLP Re-Evaluation  02/16/19    SLP Start Time  20    SLP Stop Time   1100    SLP Time Calculation (min)  42 min    Activity Tolerance  Patient tolerated treatment well       Past Medical History:  Diagnosis Date  . Acute kidney failure (HCC)    hx of   . Arthritis    fingers,right shoulder  . Bradycardia   . 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 CHF (congestive heart failure) (HCC)    acute diastolic ( congestive ) heart failure)   . Chronic lumbar radiculopathy    L5- S1  right leg  . Constipation   . Depression   . Diverticulosis large intestine w/o perforation or abscess w/bleeding   . Dizziness   . Dry eye syndrome of bilateral lacrimal glands   . Frequency of urination   . GERD (gastroesophageal reflux disease)   . Hard of hearing   . History of kidney stones 05/12/15   surgery   . HTN (hypertension)   . Hyperlipidemia   . Hypokalemia   . Morbid obesity due to excess calories ([place])   . 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 Numbness and tingling in hands   . OSA (obstructive sleep apnea)    moderate OSA per study 11-22-2007--  refused CPAP but used oxygen for 6 months at night,  states due to wt loss stopped using oxygen  . Polyneuropathy, diabetic ([place])    WALKS W/ CANE FOR BALANCE  . 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 Pyonephrosis   . Pyonephrosis   . Radiculopathy of lumbar region   .  Respiratory failure (HCC)    hx of acute respiratory failure wtih hypoxia   . Right ureteral stone   . Severe sepsis with septic shock (CODE) ([place])   . Shortness of breath dyspnea   . Spinal stenosis, cervical region   . SUI (stress urinary incontinence, female)   . Tubulo-interstitial nephritis   . Type 2 diabetes mellitus (HCC)    Type II - Diet controlled  . Urinary incontinence   . Urinary tract infection   . Wears glasses     Past Surgical History:  Procedure Laterality Date  . ANTERIOR CERVICAL DECOMP/DISCECTOMY FUSION N/A 03/23/2016   Procedure: Cervical Four-Five, Cervical Five-Six, Cervical Six-Seven Anterior cervical decompression/diskectomy/fusion;  Surgeon: Leeroy Cha, MD;  Location: [facility];  Service: Neurosurgery;  Laterality: N/A;  C4-5 C5-6 C6-7 Anterior cervical decompression/diskectomy/fusion  . CARDIOVASCULAR STRESS TEST  09-30-2007     normal Adenosine study/  no ischemia /  normal LV function and wall motion , ef 82%  . COLONOSCOPY    . CYSTOSCOPY WITH RETROGRADE PYELOGRAM, URETEROSCOPY AND STENT PLACEMENT Right 05/12/2015   Procedure: CYSTOSCOPY WITH RETROGRADE PYELOGRAM, URETEROSCOPY,STONE EXTRACTION AND STENT PLACEMENT;  Surgeon: Franchot Gallo, MD;  Location: [facility];  Service: Urology;  Laterality: Right;  . CYSTOSCOPY WITH RETROGRADE PYELOGRAM, URETEROSCOPY AND STENT PLACEMENT Right 04/25/2018   Procedure: CYSTOSCOPY WITH RETROGRADE PYELOGRAM, URETEROSCOPY AND STENT EXCHANGE;  Surgeon: Alexis Frock, MD;  Location: [facility];  Service: Urology;  Laterality: Right;  . CYSTOSCOPY WITH STENT PLACEMENT Right 03/28/2018   Procedure: CYSTOSCOPY WITH STENT PLACEMENT retrograde pylegram;  Surgeon: Alexis Frock, MD;  Location: [facility];  Service: Urology;  Laterality: Right;  . EXTRACORPOREAL SHOCK WAVE LITHOTRIPSY Right 08-18-2012  . HOLMIUM LASER APPLICATION Right 1/61/0960   Procedure: HOLMIUM LASER APPLICATION;  Surgeon: Franchot Gallo, MD;  Location[facility];  Service: Urology;  Laterality: Right;  . HOLMIUM LASER APPLICATION Right 4/54/0981   Procedure: HOLMIUM LASER APPLICATION;  Surgeon: Alexis Frock, MD;  [facility];  Service: Urology;  Laterality: Right;  . ORIF HUMERUS FRACTURE Right 07/20/2016   Procedure: OPEN REDUCTION INTERNAL FIXATION (ORIF) PROXIMAL HUMERUS FRACTURE VS REVERSE TOTAL SHOULDER ARTHROPLASTY;  Surgeon: Netta Cedars, MD;  Location: [facility];  Service: Orthopedics;  Laterality: Right;  . SHOULDER ARTHROSCOPY Right 07/2016  . TRANSTHORACIC ECHOCARDIOGRAM  09-23-2007   pseudonormal LV filling pattern,  ef 65-70%/  trivial AR/  mild LAE  . TUBAL LIGATION  1985    There were no vitals filed for this visit.         ADULT SLP TREATMENT - 11/27/18 1208      General Information   Behavior/Cognition  Alert;Cooperative;Pleasant mood      Treatment Provided   Treatment provided  Dysphagia      Dysphagia Treatment   Temperature Spikes Noted  No    Respiratory Status  Room air    Oral Cavity - Dentition  Adequate natural dentition    Treatment Methods  Skilled observation;Compensation strategy training;Patient/caregiver education    Patient observed directly with PO's  Yes    Type of PO's observed  Dysphagia 3 (soft);Thin liquids    Feeding  Needs assist   minimal   Liquids provided via  Cup    Oral Phase Signs & Symptoms  --   none noted   Pharyngeal Phase Signs & Symptoms  Immediate cough   after larger sip   Type of cueing  Verbal   for sips "half that size"   Amount of cueing  Minimal    Other treatment/comments  After first sip was too large, SLP cued pt to take sip "half that size" and pt without overt s/s aspiration after that. Told caregiver [name]) that pt may require verbal cues periodically for smaller sip sizes. Pt very good with smaller bites with cereal bar, however SLP told pt caregiver that pt may require cues for smaller bites as well, given pt  history with anticipatory awareness and falls. SLP discussed mixed consistencies and provided handouts and compensations for mixed consistencies. SLP ensured pt caregiver knew what these were for pt safety.      Assessment / Recommendations / Plan   Plan  Continue with current plan of care      Progression Toward Goals   Progression toward goals  Progressing toward goals       SLP Education - 11/27/18 1300    Education Details  mixed consistencies, swallow precautions, pt needs to cough to clear and then reswallow (not throat clear)    Person(s) Educated  Patient;Caregiver(s)    Methods  Explanation;Handout;Verbal cues    Comprehension  Verbalized understanding;Need further instruction;Verbal cues required  SLP Short Term Goals - 11/27/18 1303      SLP SHORT TERM GOAL #1   Title  Pt will demonstrate carryover of swallowing precautions with regular solids, thin liquids x2 sessions with rare min A.     Baseline  11-27-18    Time  2    Period  Weeks    Status  On-going      SLP SHORT TERM GOAL #2   Title  Pt will ID short rushes of speech in 18/20 sentence responses x3 sessions.     Time  2    Period  Weeks    Status  On-going      SLP SHORT TERM GOAL #3   Title  Pt will use compensations for dysarthria in 8 minutes simple conversation x 2 sessions for >90% intelligibility, with occasional min A.    Time  2    Period  Weeks    Status  On-going       SLP Long Term Goals - 11/27/18 1303      SLP LONG TERM GOAL #1   Title  Pt will demo swallow compensations independently with regular solids, thin liquids x2 sessions.     Time  5    Period  Weeks    Status  On-going      SLP LONG TERM GOAL #2   Title  Pt will tell SLP 3 signs of aspiration PNA.    Time  5    Period  Weeks    Status  On-going      SLP LONG TERM GOAL #3   Title  Pt will report fewer requests to repeat herself/need for assistance to relay personal information (banking details) on community outings  than prior to [place].    Time  5    Period  Weeks    Status  On-going      SLP LONG TERM GOAL #4   Title  Pt will demo awareness of short rushes of speech by attempting correction in 4/5 opportunities x 3 sessions.    Time  5    Period  Weeks    Status  On-going      SLP LONG TERM GOAL #5   Title  Pt will use compensations for dysarthria for >95% intelligibility in 10 minutes simple-mod complex conversation x 3 visits (self-corrections allowed)    Time  5    Period  Weeks    Status  On-going       Plan - 11/27/18 1303    Clinical Impression Statement  Ms. 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 Speech is characterized by predominantly spastic and hypokinetic features: strained, strangled vocal quality, imprecise articulation, short rushes of speech, monopitch, and loudness decay with longer utterances. Pt also noted to cough frequently, presumably on her saliva (although chronic cough noted in history). Pt reports she is tolerating regular diet with precautions, however she was noted to take large, consecutive sips when given a cup of water. Her primary concern is her communication; she reports she has difficulty communicating in person with friends and caregiver, on the phone, and when running errands in the community. Awareness of rapid speech improving at sentence level with question cues. I recommend skilled ST for training in aspiration precautions, swallow compensations, strategies and compensations for dysarthria in order to improve swallowing safety, intelligibility and quality of life.      Speech Therapy Frequency  2x /  week    Duration  --   6 weeks or 13 total visits   Treatment/Interventions  Aspiration precaution training;Diet toleration management by SLP;Cognitive reorganization;Compensatory strategies;Patient/family education;SLP instruction and feedback;Functional  tasks;Internal/external aids;Cueing hierarchy;Compensatory techniques    Potential to Achieve Goals  Good       Patient will benefit from skilled therapeutic intervention in order to improve the following deficits and impairments:   Dysphagia, oropharyngeal phase  Cognitive communication deficit  Dysarthria and anarthria    Problem List Patient Active Problem List   Diagnosis Date Noted  . Bradycardia 04/01/2018  . UTI (urinary tract infection) 04/01/2018  . ARF (acute renal failure) ([place]) 04/01/2018  . Right ureteral stone 04/01/2018  . Acute diastolic heart failure ([place]) 04/01/2018  . Acute respiratory failure with hypoxia ([place])   . Pyelonephritis   . Severe sepsis ([place])   . Pyohydronephrosis 03/29/2018  . Septic shock ([place]) 03/29/2018  . Hypokalemia 03/28/2018  . Severe sepsis with septic shock ([place]) 03/28/2018  . Elevated lactic acid level 03/28/2018  . Elevated troponin 03/28/2018  . PSP (progressive supranuclear palsy) ([place]) 02/18/2018  . Chronic cough 11/19/2017  . 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 Cervical stenosis of spinal canal 03/23/2016  . OSA on CPAP 12/14/2015  . Dizziness and giddiness 11/24/2013  . Diabetes type 2, uncontrolled ([place]) 11/24/2013  . Severe obesity (BMI >= 40) ([place]) 11/24/2013  . Lumbar radiculopathy 11/24/2013    [facility] ,[place], CCC-SLP  11/27/2018, 1:04 PM  [facility] [address] [place], [name], [address] Phone: [phone]   Fax:  [phone]   Name: [name] [name] MRN: [id number] Date of Birth: Oct 22, 1951

## 2018-12-02 ENCOUNTER — Ambulatory Visit: Payer: PPO

## 2018-12-02 DIAGNOSIS — R1312 Dysphagia, oropharyngeal phase: Secondary | ICD-10-CM

## 2018-12-02 DIAGNOSIS — R41841 Cognitive communication deficit: Secondary | ICD-10-CM

## 2018-12-02 DIAGNOSIS — R471 Dysarthria and anarthria: Secondary | ICD-10-CM | POA: Diagnosis not present

## 2018-12-02 NOTE — Patient Instructions (Signed)
  Please complete the assigned speech therapy homework prior to your next session and return it to the speech therapist at your next visit.  

## 2018-12-02 NOTE — Therapy (Signed)
[facility] [address] [place], [place], [address] Phone: [phone]   Fax:  [phone]  Speech Language Pathology Treatment  Patient Details  Name: [name] [name] MRN: [id number] Date of Birth: May 19, 1951 Referring Provider (SLP): Doran Stabler, MD   Encounter Date: 12/02/2018  End of Session - 12/02/18 1400    Visit Number  5    Number of Visits  13    Date for SLP Re-Evaluation  02/16/19    Authorization Type  Medicare/HT Advantage    SLP Start Time  1319    SLP Stop Time   1400    SLP Time Calculation (min)  41 min    Activity Tolerance  Patient tolerated treatment well       Past Medical History:  Diagnosis Date  . Acute kidney failure (HCC)    hx of   . Arthritis    fingers,right shoulder  . Bradycardia   . 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 CHF (congestive heart failure) (HCC)    acute diastolic ( congestive ) heart failure)   . Chronic lumbar radiculopathy    L5- S1  right leg  . Constipation   . Depression   . Diverticulosis large intestine w/o perforation or abscess w/bleeding   . Dizziness   . Dry eye syndrome of bilateral lacrimal glands   . Frequency of urination   . GERD (gastroesophageal reflux disease)   . Hard of hearing   . History of kidney stones 05/12/15   surgery   . HTN (hypertension)   . Hyperlipidemia   . Hypokalemia   . Morbid obesity due to excess calories ([place])   . 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 Numbness and tingling in hands   . OSA (obstructive sleep apnea)    moderate OSA per study 11-22-2007--  refused CPAP but used oxygen for 6 months at night,  states due to wt loss stopped using oxygen  . Polyneuropathy, diabetic ([place])    WALKS W/ CANE FOR BALANCE  . 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 Progressive supranuclear palsy ([place])   . Pyonephrosis   . Pyonephrosis    . Radiculopathy of lumbar region   . Respiratory failure (HCC)    hx of acute respiratory failure wtih hypoxia   . Right ureteral stone   . Severe sepsis with septic shock (CODE) ([place])   . Shortness of breath dyspnea   . Spinal stenosis, cervical region   . SUI (stress urinary incontinence, female)   . Tubulo-interstitial nephritis   . Type 2 diabetes mellitus (HCC)    Type II - Diet controlled  . Urinary incontinence   . Urinary tract infection   . Wears glasses     Past Surgical History:  Procedure Laterality Date  . ANTERIOR CERVICAL DECOMP/DISCECTOMY FUSION N/A 03/23/2016   Procedure: Cervical Four-Five, Cervical Five-Six, Cervical Six-Seven Anterior cervical decompression/diskectomy/fusion;  Surgeon: Leeroy Cha, MD;  Location: [facility];  Service: Neurosurgery;  Laterality: N/A;  C4-5 C5-6 C6-7 Anterior cervical decompression/diskectomy/fusion  . CARDIOVASCULAR STRESS TEST  09-30-2007     normal Adenosine study/  no ischemia /  normal LV function and wall motion , ef 82%  . COLONOSCOPY    . CYSTOSCOPY WITH RETROGRADE PYELOGRAM, URETEROSCOPY AND STENT PLACEMENT Right 05/12/2015   Procedure: CYSTOSCOPY WITH RETROGRADE PYELOGRAM, URETEROSCOPY,STONE EXTRACTION AND STENT PLACEMENT;  Surgeon: Franchot Gallo, MD;  Location[facility];  Service: Urology;  Laterality: Right;  . CYSTOSCOPY WITH RETROGRADE PYELOGRAM, URETEROSCOPY AND STENT PLACEMENT Right 04/25/2018   Procedure: CYSTOSCOPY WITH RETROGRADE PYELOGRAM, URETEROSCOPY AND STENT EXCHANGE;  Surgeon: Alexis Frock, MD;  [facility];  Service: Urology;  Laterality: Right;  . CYSTOSCOPY WITH STENT PLACEMENT Right 03/28/2018   Procedure: CYSTOSCOPY WITH STENT PLACEMENT retrograde pylegram;  Surgeon: Alexis Frock, MD;  [facility];  Service: Urology;  Laterality: Right;  . EXTRACORPOREAL SHOCK WAVE LITHOTRIPSY Right 08-18-2012  . HOLMIUM LASER APPLICATION Right 1/61/0960   Procedure: HOLMIUM LASER  APPLICATION;  Surgeon: Franchot Gallo, MD;  Location: [facility];  Service: Urology;  Laterality: Right;  . HOLMIUM LASER APPLICATION Right 4/54/0981   Procedure: HOLMIUM LASER APPLICATION;  Surgeon: Alexis Frock, MD;  [facility];  Service: Urology;  Laterality: Right;  . ORIF HUMERUS FRACTURE Right 07/20/2016   Procedure: OPEN REDUCTION INTERNAL FIXATION (ORIF) PROXIMAL HUMERUS FRACTURE VS REVERSE TOTAL SHOULDER ARTHROPLASTY;  Surgeon: Netta Cedars, MD;  Location: [facility];  Service: Orthopedics;  Laterality: Right;  . SHOULDER ARTHROSCOPY Right 07/2016  . TRANSTHORACIC ECHOCARDIOGRAM  09-23-2007   pseudonormal LV filling pattern,  ef 65-70%/  trivial AR/  mild LAE  . TUBAL LIGATION  1985    There were no vitals filed for this visit.  Subjective Assessment - 12/02/18 1324    Subjective  "(unintelligible) trash can."    Patient is accompained by:  Family member   husband   Currently in Pain?  Yes    Pain Score  2     Pain Location  Neck    Pain Orientation  Right    Pain Descriptors / Indicators  Aching    Pain Type  Chronic pain    Pain Onset  More than a month ago    Pain Frequency  Constant    Aggravating Factors   sitting    Pain Relieving Factors  biofreeze gel            ADULT SLP TREATMENT - 12/02/18 1327      General Information   Behavior/Cognition  Alert;Cooperative;Pleasant mood      Treatment Provided   Treatment provided  Cognitive-Linquistic      Dysphagia Treatment   Other treatment/comments  Pt reminded to cough and reswallow instead of give weak throat clear.       Cognitive-Linquistic Treatment   Treatment focused on  Dysarthria    Skilled Treatment  "We use the SLOP method - 'Slow Loud Overarticulate and Pause.'" SLP engaged pt in word level tasks with 90% succcess in rate reduction, in rote phrases with 90% success in rate reduction. SLP incr'd difficulty to sentence responses and pt successful with rate reduction 50% of the  time for functional communication- and with min cues pt incr'd functional communication in sentence level to 65%. In conversational (simple) speech pt slowed rate functionally approx 30% with min A.       Assessment / Recommendations / Plan   Plan  Continue with current plan of care      Progression Toward Goals   Progression toward goals  Progressing toward goals         SLP Short Term Goals - 12/02/18 1401      SLP SHORT TERM GOAL #1   Title  Pt will demonstrate carryover of swallowing precautions with regular solids, thin liquids x2 sessions with rare min A.     Baseline  11-27-18    Time  1    Period  Weeks    Status  On-going      SLP SHORT TERM GOAL #2   Title  Pt will ID short rushes of speech in 18/20 sentence responses x3 sessions.     Time  1    Period  Weeks    Status  On-going      SLP SHORT TERM GOAL #3   Title  Pt will use compensations for dysarthria in 8 minutes simple conversation x 2 sessions for >90% intelligibility, with occasional min A.    Time  1    Period  Weeks    Status  On-going       SLP Long Term Goals - 12/02/18 1401      SLP LONG TERM GOAL #1   Title  Pt will demo swallow compensations independently with regular solids, thin liquids x2 sessions.     Time  4    Period  Weeks    Status  On-going      SLP LONG TERM GOAL #2   Title  Pt will tell SLP 3 signs of aspiration PNA.    Time  4    Period  Weeks    Status  On-going      SLP LONG TERM GOAL #3   Title  Pt will report fewer requests to repeat herself/need for assistance to relay personal information (banking details) on community outings than prior to [place].    Time  4    Period  Weeks    Status  On-going      SLP LONG TERM GOAL #4   Title  Pt will demo awareness of short rushes of speech by attempting correction in 4/5 opportunities x 3 sessions.    Time  4    Period  Weeks    Status  On-going      SLP LONG TERM GOAL #5   Title  Pt will use compensations for dysarthria for  >95% intelligibility in 10 minutes simple-mod complex conversation x 3 visits (self-corrections allowed)    Time  4    Period  Weeks    Status  On-going       Plan - 12/02/18 1400    Clinical Impression Statement  Ms. 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 Speech is characterized by predominantly spastic and hypokinetic features: strained, strangled vocal quality, imprecise articulation, short rushes of speech, monopitch, and loudness decay with longer utterances. Pt also noted to cough frequently, presumably on her saliva (although chronic cough noted in history). Pt reports she is tolerating regular diet with precautions, however she was noted to take large, consecutive sips when given a cup of water. Her primary concern is her communication; she reports she has difficulty communicating in person with friends and caregiver, on the phone, and when running errands in the community. Awareness of rapid speech improving at sentence level with question cues. I recommend skilled ST for training in aspiration precautions, swallow compensations, strategies and compensations for dysarthria in order to improve swallowing safety, intelligibility and quality of life.      Speech Therapy Frequency  2x / week    Duration  --   6 weeks or 13 total visits   Treatment/Interventions  Aspiration precaution training;Diet toleration management by SLP;Cognitive reorganization;Compensatory strategies;Patient/family education;SLP instruction and feedback;Functional tasks;Internal/external aids;Cueing hierarchy;Compensatory techniques    Potential to Achieve Goals  Good  Patient will benefit from skilled therapeutic intervention in order to improve the following deficits and impairments:   Dysarthria and anarthria  Dysphagia, oropharyngeal phase  Cognitive communication deficit    Problem  List Patient Active Problem List   Diagnosis Date Noted  . Bradycardia 04/01/2018  . UTI (urinary tract infection) 04/01/2018  . ARF (acute renal failure) ([place]) 04/01/2018  . Right ureteral stone 04/01/2018  . Acute diastolic heart failure ([place]) 04/01/2018  . Acute respiratory failure with hypoxia ([place])   . Pyelonephritis   . Severe sepsis ([place])   . Pyohydronephrosis 03/29/2018  . Septic shock ([place]) 03/29/2018  . Hypokalemia 03/28/2018  . Severe sepsis with septic shock ([place]) 03/28/2018  . Elevated lactic acid level 03/28/2018  . Elevated troponin 03/28/2018  . PSP (progressive supranuclear palsy) ([place]) 02/18/2018  . Chronic cough 11/19/2017  . 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 Cervical stenosis of spinal canal 03/23/2016  . OSA on CPAP 12/14/2015  . Dizziness and giddiness 11/24/2013  . Diabetes type 2, uncontrolled ([place]) 11/24/2013  . Severe obesity (BMI >= 40) ([place]) 11/24/2013  . Lumbar radiculopathy 11/24/2013    [facility] ,[place], CCC-SLP  12/02/2018, 2:02 PM  [facility] [address] [place], [place], [address] Phone: [phone]   Fax:  [phone]   Name: [name] [name] MRN: [id number] Date of Birth: [birth year]

## 2018-12-05 ENCOUNTER — Ambulatory Visit: Payer: PPO

## 2018-12-05 DIAGNOSIS — R471 Dysarthria and anarthria: Secondary | ICD-10-CM | POA: Diagnosis not present

## 2018-12-05 DIAGNOSIS — R1312 Dysphagia, oropharyngeal phase: Secondary | ICD-10-CM

## 2018-12-05 DIAGNOSIS — R41841 Cognitive communication deficit: Secondary | ICD-10-CM

## 2018-12-06 NOTE — Therapy (Signed)
[facility] [address] [place], [place], [address] Phone: [phone]   Fax:  [phone]  Speech Language Pathology Treatment  Patient Details  Name: [name] [name] MRN: [id number] Date of Birth: [birth year] Referring Provider (SLP): Doran Stabler, MD   Encounter Date: 12/05/2018  End of Session - 12/06/18 0006    Visit Number  6    Number of Visits  13    Date for SLP Re-Evaluation  02/16/19    Authorization Type  Medicare/HT Advantage    SLP Start Time  1318    SLP Stop Time   1400    SLP Time Calculation (min)  42 min    Activity Tolerance  Patient tolerated treatment well       Past Medical History:  Diagnosis Date  . Acute kidney failure (HCC)    hx of   . Arthritis    fingers,right shoulder  . Bradycardia   . 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 CHF (congestive heart failure) (HCC)    acute diastolic ( congestive ) heart failure)   . Chronic lumbar radiculopathy    L5- S1  right leg  . Constipation   . Depression   . Diverticulosis large intestine w/o perforation or abscess w/bleeding   . Dizziness   . Dry eye syndrome of bilateral lacrimal glands   . Frequency of urination   . GERD (gastroesophageal reflux disease)   . Hard of hearing   . History of kidney stones 05/12/15   surgery   . HTN (hypertension)   . Hyperlipidemia   . Hypokalemia   . Morbid obesity due to excess calories ([place])   . 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 Numbness and tingling in hands   . OSA (obstructive sleep apnea)    moderate OSA per study 11-22-2007--  refused CPAP but used oxygen for 6 months at night,  states due to wt loss stopped using oxygen  . Polyneuropathy, diabetic ([place])    WALKS W/ CANE FOR BALANCE  . 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 Pyonephrosis   . Pyonephrosis    . Radiculopathy of lumbar region   . Respiratory failure (HCC)    hx of acute respiratory failure wtih hypoxia   . Right ureteral stone   . Severe sepsis with septic shock (CODE) ([place])   . Shortness of breath dyspnea   . Spinal stenosis, cervical region   . SUI (stress urinary incontinence, female)   . Tubulo-interstitial nephritis   . Type 2 diabetes mellitus (HCC)    Type II - Diet controlled  . Urinary incontinence   . Urinary tract infection   . Wears glasses     Past Surgical History:  Procedure Laterality Date  . ANTERIOR CERVICAL DECOMP/DISCECTOMY FUSION N/A 03/23/2016   Procedure: Cervical Four-Five, Cervical Five-Six, Cervical Six-Seven Anterior cervical decompression/diskectomy/fusion;  Surgeon: Leeroy Cha, MD;  Location: [facility];  Service: Neurosurgery;  Laterality: N/A;  C4-5 C5-6 C6-7 Anterior cervical decompression/diskectomy/fusion  . CARDIOVASCULAR STRESS TEST  09-30-2007     normal Adenosine study/  no ischemia /  normal LV function and wall motion , ef 82%  . COLONOSCOPY    . CYSTOSCOPY WITH RETROGRADE PYELOGRAM, URETEROSCOPY AND STENT PLACEMENT Right 05/12/2015   Procedure: CYSTOSCOPY WITH RETROGRADE PYELOGRAM, URETEROSCOPY,STONE EXTRACTION AND STENT PLACEMENT;  Surgeon: Franchot Gallo, MD;  [facility];  Service: Urology;  Laterality: Right;  . CYSTOSCOPY WITH RETROGRADE PYELOGRAM, URETEROSCOPY AND STENT PLACEMENT Right 04/25/2018   Procedure: CYSTOSCOPY WITH RETROGRADE PYELOGRAM, URETEROSCOPY AND STENT EXCHANGE;  Surgeon: Alexis Frock, MD;  Location: [facility];  Service: Urology;  Laterality: Right;  . CYSTOSCOPY WITH STENT PLACEMENT Right 03/28/2018   Procedure: CYSTOSCOPY WITH STENT PLACEMENT retrograde pylegram;  Surgeon: Alexis Frock, MD;  Location: [facility];  Service: Urology;  Laterality: Right;  . EXTRACORPOREAL SHOCK WAVE LITHOTRIPSY Right 08-18-2012  . HOLMIUM LASER APPLICATION Right 12/14/3341   Procedure: HOLMIUM LASER  APPLICATION;  Surgeon: Franchot Gallo, MD;  Location: [facility];  Service: Urology;  Laterality: Right;  . HOLMIUM LASER APPLICATION Right 5/68/6168   Procedure: HOLMIUM LASER APPLICATION;  Surgeon: Alexis Frock, MD;  Location: [facility];  Service: Urology;  Laterality: Right;  . ORIF HUMERUS FRACTURE Right 07/20/2016   Procedure: OPEN REDUCTION INTERNAL FIXATION (ORIF) PROXIMAL HUMERUS FRACTURE VS REVERSE TOTAL SHOULDER ARTHROPLASTY;  Surgeon: Netta Cedars, MD;  Location: [facility];  Service: Orthopedics;  Laterality: Right;  . SHOULDER ARTHROSCOPY Right 07/2016  . TRANSTHORACIC ECHOCARDIOGRAM  09-23-2007   pseudonormal LV filling pattern,  ef 65-70%/  trivial AR/  mild LAE  . TUBAL LIGATION  1985    There were no vitals filed for this visit.  Subjective Assessment - 12/05/18 1316    Patient is accompained by:  Family member   husband   Currently in Pain?  Yes    Pain Score  2     Pain Location  Neck    Pain Orientation  Right    Pain Descriptors / Indicators  Aching    Pain Onset  More than a month ago    Pain Frequency  Constant            ADULT SLP TREATMENT - 12/06/18 0001      General Information   Behavior/Cognition  Alert;Cooperative;Pleasant mood      Treatment Provided   Treatment provided  Cognitive-Linquistic      Cognitive-Linquistic Treatment   Treatment focused on  Dysarthria    Skilled Treatment  SLP reviewed SLOP with pt and she recalled spontaneously. SLP modeled intelligibility strategies for pt with sentence task. In picture description task, pt used slower intelligible rate 70% of the time with occasional min A for "dragging the words out" with demo cue. SLP also used a attendant's bell to signal the patient when she finished a phrase with "so" in order to raise awareness of this habit. After 6 rings, pt covered her mouth immediately after saying "so", 4 more times. The next 3 minutes pt did not say "so" at the end of utterances, but as  cognition re: how to describe pictures increased pt began inserting again.       Assessment / Recommendations / Plan   Plan  Continue with current plan of care         SLP Short Term Goals - 12/06/18 0013      SLP SHORT TERM GOAL #1   Title  Pt will demonstrate carryover of swallowing precautions with regular solids, thin liquids x2 sessions with rare min A.     Baseline  11-27-18    Status  Partially Met      SLP SHORT TERM GOAL #2   Title  Pt will ID short rushes of speech in 18/20 sentence responses x3 sessions.     Status  Not Met  SLP SHORT TERM GOAL #3   Title  Pt will use compensations for dysarthria in 8 minutes simple conversation x 2 sessions for >90% intelligibility, with occasional min A.    Status  Not Met       SLP Long Term Goals - 12/06/18 0016      SLP LONG TERM GOAL #1   Title  Pt will demo swallow compensations independently with regular solids, thin liquids x2 sessions.     Time  4    Period  Weeks    Status  On-going      SLP LONG TERM GOAL #2   Title  Pt will tell SLP 3 signs of aspiration PNA.    Time  4    Period  Weeks    Status  On-going      SLP LONG TERM GOAL #3   Title  Pt will report fewer requests to repeat herself/need for assistance to relay personal information (banking details) on community outings than prior to [place].    Time  4    Period  Weeks    Status  On-going      SLP LONG TERM GOAL #4   Title  Pt will demo awareness of short rushes of speech by attempting correction in 4/5 opportunities x 3 sessions.    Time  4    Period  Weeks    Status  On-going      SLP LONG TERM GOAL #5   Title  Pt will use compensations for dysarthria for >95% intelligibility in 10 minutes simple-mod complex conversation x 3 visits (self-corrections allowed)    Time  4    Period  Weeks    Status  On-going       Plan - 12/06/18 0007    Clinical Impression Statement  Ms. 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 Speech is characterized by predominantly spastic and hypokinetic features: strained, strangled vocal quality, imprecise articulation, short rushes of speech, monopitch, and loudness decay with longer utterances. Her primary concern is her communication; she reports she has difficulty communicating in person with friends and caregiver, on the phone, and when running errands in the community.  I recommend skilled ST for training in aspiration precautions, swallow compensations, strategies and compensations for dysarthria in order to improve swallowing safety, intelligibility and quality of life.      Speech Therapy Frequency  2x / week    Duration  --   6 weeks or 13 total visits   Treatment/Interventions  Aspiration precaution training;Diet toleration management by SLP;Cognitive reorganization;Compensatory strategies;Patient/family education;SLP instruction and feedback;Functional tasks;Internal/external aids;Cueing hierarchy;Compensatory techniques    Potential to Achieve Goals  Good       Patient will benefit from skilled therapeutic intervention in order to improve the following deficits and impairments:   Dysarthria and anarthria  Dysphagia, oropharyngeal phase  Cognitive communication deficit    Problem List Patient Active Problem List   Diagnosis Date Noted  . Bradycardia 04/01/2018  . UTI (urinary tract infection) 04/01/2018  . ARF (acute renal failure) ([place]) 04/01/2018  . Right ureteral stone 04/01/2018  . Acute diastolic heart failure ([place]) 04/01/2018  . Acute respiratory failure with hypoxia ([place])   . Pyelonephritis   . Severe sepsis ([place])   . Pyohydronephrosis 03/29/2018  . Septic shock ([place]) 03/29/2018  . Hypokalemia 03/28/2018  . Severe sepsis with septic shock ([place]) 03/28/2018  . Elevated lactic acid level 03/28/2018  .  Elevated troponin 03/28/2018  . PSP (progressive supranuclear palsy)  ([place]) 02/18/2018  . Chronic cough 11/19/2017  . 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 Cervical stenosis of spinal canal 03/23/2016  . OSA on CPAP 12/14/2015  . Dizziness and giddiness 11/24/2013  . Diabetes type 2, uncontrolled ([place]) 11/24/2013  . Severe obesity (BMI >= 40) ([place]) 11/24/2013  . Lumbar radiculopathy 11/24/2013    [facility] ,MS, CCC-SLP  12/06/2018, 12:17 AM  [facility] [address] [place], [place], [address] Phone: [phone]   Fax:  [phone]   Name: [name] [name] MRN: [id number] Date of Birth: 25-Oct-1951

## 2018-12-08 ENCOUNTER — Ambulatory Visit: Payer: PPO | Admitting: Neurology

## 2018-12-09 ENCOUNTER — Ambulatory Visit: Payer: PPO | Admitting: Speech Pathology

## 2018-12-09 DIAGNOSIS — R41841 Cognitive communication deficit: Secondary | ICD-10-CM

## 2018-12-09 DIAGNOSIS — R471 Dysarthria and anarthria: Secondary | ICD-10-CM | POA: Diagnosis not present

## 2018-12-09 DIAGNOSIS — R1312 Dysphagia, oropharyngeal phase: Secondary | ICD-10-CM

## 2018-12-09 NOTE — Therapy (Signed)
[facility] [address] [place], [place], [address] Phone: [phone]   Fax:  [phone]  Speech Language Pathology Treatment  Patient Details  Name: [name] [name] MRN: [id number] Date of Birth: 1951/06/06 Referring Provider (SLP): Doran Stabler, MD   Encounter Date: 12/09/2018  End of Session - 12/09/18 1405    Visit Number  7    Number of Visits  13    Date for SLP Re-Evaluation  02/16/19    Authorization Type  Medicare/HT Advantage    SLP Start Time  1017    SLP Stop Time   1100    SLP Time Calculation (min)  43 min    Activity Tolerance  Patient tolerated treatment well       Past Medical History:  Diagnosis Date  . Acute kidney failure (HCC)    hx of   . Arthritis    fingers,right shoulder  . Bradycardia   . 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 CHF (congestive heart failure) (HCC)    acute diastolic ( congestive ) heart failure)   . Chronic lumbar radiculopathy    L5- S1  right leg  . Constipation   . Depression   . Diverticulosis large intestine w/o perforation or abscess w/bleeding   . Dizziness   . Dry eye syndrome of bilateral lacrimal glands   . Frequency of urination   . GERD (gastroesophageal reflux disease)   . Hard of hearing   . History of kidney stones 05/12/15   surgery   . HTN (hypertension)   . Hyperlipidemia   . Hypokalemia   . Morbid obesity due to excess calories ([place])   . 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 Numbness and tingling in hands   . OSA (obstructive sleep apnea)    moderate OSA per study 11-22-2007--  refused CPAP but used oxygen for 6 months at night,  states due to wt loss stopped using oxygen  . Polyneuropathy, diabetic ([place])    WALKS W/ CANE FOR BALANCE  . 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 Progressive supranuclear palsy ([place])   . Pyonephrosis   . Pyonephrosis    . Radiculopathy of lumbar region   . Respiratory failure (HCC)    hx of acute respiratory failure wtih hypoxia   . Right ureteral stone   . Severe sepsis with septic shock (CODE) ([place])   . Shortness of breath dyspnea   . Spinal stenosis, cervical region   . SUI (stress urinary incontinence, female)   . Tubulo-interstitial nephritis   . Type 2 diabetes mellitus (HCC)    Type II - Diet controlled  . Urinary incontinence   . Urinary tract infection   . Wears glasses     Past Surgical History:  Procedure Laterality Date  . ANTERIOR CERVICAL DECOMP/DISCECTOMY FUSION N/A 03/23/2016   Procedure: Cervical Four-Five, Cervical Five-Six, Cervical Six-Seven Anterior cervical decompression/diskectomy/fusion;  Surgeon: Leeroy Cha, MD;  Location: [facility];  Service: Neurosurgery;  Laterality: N/A;  C4-5 C5-6 C6-7 Anterior cervical decompression/diskectomy/fusion  . CARDIOVASCULAR STRESS TEST  09-30-2007     normal Adenosine study/  no ischemia /  normal LV function and wall motion , ef 82%  . COLONOSCOPY    . CYSTOSCOPY WITH RETROGRADE PYELOGRAM, URETEROSCOPY AND STENT PLACEMENT Right 05/12/2015   Procedure: CYSTOSCOPY WITH RETROGRADE PYELOGRAM, URETEROSCOPY,STONE EXTRACTION AND STENT PLACEMENT;  Surgeon: Franchot Gallo, MD;  Location[facility];  Service: Urology;  Laterality: Right;  . CYSTOSCOPY WITH RETROGRADE PYELOGRAM, URETEROSCOPY AND STENT PLACEMENT Right 04/25/2018   Procedure: CYSTOSCOPY WITH RETROGRADE PYELOGRAM, URETEROSCOPY AND STENT EXCHANGE;  Surgeon: Alexis Frock, MD;  [facility];  Service: Urology;  Laterality: Right;  . CYSTOSCOPY WITH STENT PLACEMENT Right 03/28/2018   Procedure: CYSTOSCOPY WITH STENT PLACEMENT retrograde pylegram;  Surgeon: Alexis Frock, MD;  [facility];  Service: Urology;  Laterality: Right;  . EXTRACORPOREAL SHOCK WAVE LITHOTRIPSY Right 08-18-2012  . HOLMIUM LASER APPLICATION Right 6/83/4196   Procedure: HOLMIUM LASER  APPLICATION;  Surgeon: Franchot Gallo, MD;  [facility];  Service: Urology;  Laterality: Right;  . HOLMIUM LASER APPLICATION Right 01/03/9797   Procedure: HOLMIUM LASER APPLICATION;  Surgeon: Alexis Frock, MD;  [facility];  Service: Urology;  Laterality: Right;  . ORIF HUMERUS FRACTURE Right 07/20/2016   Procedure: OPEN REDUCTION INTERNAL FIXATION (ORIF) PROXIMAL HUMERUS FRACTURE VS REVERSE TOTAL SHOULDER ARTHROPLASTY;  Surgeon: Netta Cedars, MD;  Location: [facility];  Service: Orthopedics;  Laterality: Right;  . SHOULDER ARTHROSCOPY Right 07/2016  . TRANSTHORACIC ECHOCARDIOGRAM  09-23-2007   pseudonormal LV filling pattern,  ef 65-70%/  trivial AR/  mild LAE  . TUBAL LIGATION  1985    There were no vitals filed for this visit.  Subjective Assessment - 12/09/18 1022    Subjective  "Pra-pra-pra-pra-pra-pra-practice, so."    Patient is accompained by:  Family member   Dwana Curd   Currently in Pain?  Yes    Pain Score  3     Pain Location  Neck    Pain Orientation  Right    Pain Descriptors / Indicators  Aching    Pain Type  Chronic pain    Pain Onset  More than a month ago            ADULT SLP TREATMENT - 12/09/18 1018      General Information   Behavior/Cognition  Alert;Cooperative;Pleasant mood      Treatment Provided   Treatment provided  Cognitive-Linquistic      Pain Assessment   Pain Assessment  No/denies pain      Cognitive-Linquistic Treatment   Treatment focused on  Dysarthria    Skilled Treatment  Pt again spontaneously recalled SLOP strategies, however consistent modeling required to use strategies in sentence level task. Additionally, pt consistently ended her utterances in "so" without awareness. SLP used bell signal again to raise pt's awareness of this. While pt did begin to give a facial expression in response to using "so," she did not decrease her usage. SLP used tactile self-cuing strategy to enhance pt's awareness, with  loose rubber band placed over pt's sleeve. SLP demo'd slight "ping" with band on SLP's own arm with pt's use of "so", and pt followed suit. SLP faded demonstration cues, with pt recognizing extraneous "so" ~75% of the time. Pt able to curb usage in simple structured tasks, but resumes as cognitive load increases. In simple sentence responses and description tasks, pt used slower rate ~75% of the time, aware of faster rate with occasional question cues.       Assessment / Recommendations / Plan   Plan  Continue with current plan of care;Goals updated   downgraded conversation goal     Progression Toward Goals   Progression toward goals  Progressing toward goals         SLP Short Term Goals - 12/09/18 1406  SLP SHORT TERM GOAL #1   Title  Pt will demonstrate carryover of swallowing precautions with regular solids, thin liquids x2 sessions with rare min A.     Status  Partially Met      SLP SHORT TERM GOAL #2   Title  Pt will ID short rushes of speech in 18/20 sentence responses x3 sessions.     Status  Not Met      SLP SHORT TERM GOAL #3   Title  Pt will use compensations for dysarthria in 8 minutes simple conversation x 2 sessions for >90% intelligibility, with occasional min A.    Status  Not Met       SLP Long Term Goals - 12/09/18 1406      SLP LONG TERM GOAL #1   Title  Pt will demo swallow compensations independently with regular solids, thin liquids x2 sessions.     Time  3    Period  Weeks    Status  On-going      SLP LONG TERM GOAL #2   Title  Pt will tell SLP 3 signs of aspiration PNA.    Time  3    Period  Weeks    Status  On-going      SLP LONG TERM GOAL #3   Title  Pt will report fewer requests to repeat herself/need for assistance to relay personal information (banking details) on community outings than prior to [place].    Time  3    Period  Weeks    Status  On-going      SLP LONG TERM GOAL #4   Title  Pt will demo awareness of short rushes of speech by  attempting correction in 4/5 opportunities x 3 sessions.    Time  3    Period  Weeks    Status  On-going      SLP LONG TERM GOAL #5   Title  Pt will use compensations for dysarthria for >95% intelligibility in 8 minutes simple conversation x 3 visits (self-corrections allowed)    Time  3    Period  Weeks    Status  Revised       Plan - 12/09/18 1406    Clinical Impression Statement  Ms. 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 Speech is characterized by predominantly spastic and hypokinetic features: strained, strangled vocal quality, imprecise articulation, short rushes of speech, monopitch, and loudness decay with longer utterances. Her primary concern is her communication; she reports she has difficulty communicating in person with friends and caregiver, on the phone, and when running errands in the community.  I recommend skilled ST for training in aspiration precautions, swallow compensations, strategies and compensations for dysarthria in order to improve swallowing safety, intelligibility and quality of life.      Speech Therapy Frequency  2x / week    Duration  --   6 weeks or 13 visits   Treatment/Interventions  Aspiration precaution training;Diet toleration management by SLP;Cognitive reorganization;Compensatory strategies;Patient/family education;SLP instruction and feedback;Functional tasks;Internal/external aids;Cueing hierarchy;Compensatory techniques    Potential to Achieve Goals  Good       Patient will benefit from skilled therapeutic intervention in order to improve the following deficits and impairments:   Dysarthria and anarthria  Dysphagia, oropharyngeal phase  Cognitive communication deficit    Problem List Patient Active Problem List   Diagnosis Date Noted  . Bradycardia 04/01/2018  . UTI (urinary tract  infection) 04/01/2018  . ARF (acute renal  failure) ([place]) 04/01/2018  . Right ureteral stone 04/01/2018  . Acute diastolic heart failure ([place]) 04/01/2018  . Acute respiratory failure with hypoxia ([place])   . Pyelonephritis   . Severe sepsis ([place])   . Pyohydronephrosis 03/29/2018  . Septic shock ([place]) 03/29/2018  . Hypokalemia 03/28/2018  . Severe sepsis with septic shock ([place]) 03/28/2018  . Elevated lactic acid level 03/28/2018  . Elevated troponin 03/28/2018  . PSP (progressive supranuclear palsy) ([place]) 02/18/2018  . Chronic cough 11/19/2017  . 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 Cervical stenosis of spinal canal 03/23/2016  . OSA on CPAP 12/14/2015  . Dizziness and giddiness 11/24/2013  . Diabetes type 2, uncontrolled ([place]) 11/24/2013  . Severe obesity (BMI >= 40) ([place]) 11/24/2013  . Lumbar radiculopathy 11/24/2013   Deneise Lever, [place], [place] 12/09/2018, 2:08 PM  [facility] [address] [place], [place], [address] Phone: [phone]   Fax:  [phone]   Name: [name] [name] MRN: [id number] Date of Birth: 04/02/1951

## 2018-12-11 ENCOUNTER — Ambulatory Visit: Payer: PPO

## 2018-12-11 DIAGNOSIS — R471 Dysarthria and anarthria: Secondary | ICD-10-CM | POA: Diagnosis not present

## 2018-12-11 DIAGNOSIS — R1312 Dysphagia, oropharyngeal phase: Secondary | ICD-10-CM

## 2018-12-11 DIAGNOSIS — R41841 Cognitive communication deficit: Secondary | ICD-10-CM

## 2018-12-11 NOTE — Patient Instructions (Signed)
  Please complete the assigned speech therapy homework prior to your next session and return it to the speech therapist at your next visit.  

## 2018-12-11 NOTE — Therapy (Signed)
[facility] [address] [place], [place], [address] Phone: [phone]   Fax:  [phone]  Speech Language Pathology Treatment  Patient Details  Name: [name] [name] MRN: [id number] Date of Birth: 08-Jul-1951 Referring Provider (SLP): Doran Stabler, MD   Encounter Date: 12/11/2018  End of Session - 12/11/18 1204    Visit Number  8    Number of Visits  13    Date for SLP Re-Evaluation  02/16/19    Authorization Type  Medicare/HT Advantage    SLP Start Time  1105    SLP Stop Time   1145    SLP Time Calculation (min)  40 min    Activity Tolerance  Patient tolerated treatment well       Past Medical History:  Diagnosis Date  . Acute kidney failure (HCC)    hx of   . Arthritis    fingers,right shoulder  . Bradycardia   . 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 CHF (congestive heart failure) (HCC)    acute diastolic ( congestive ) heart failure)   . Chronic lumbar radiculopathy    L5- S1  right leg  . Constipation   . Depression   . Diverticulosis large intestine w/o perforation or abscess w/bleeding   . Dizziness   . Dry eye syndrome of bilateral lacrimal glands   . Frequency of urination   . GERD (gastroesophageal reflux disease)   . Hard of hearing   . History of kidney stones 05/12/15   surgery   . HTN (hypertension)   . Hyperlipidemia   . Hypokalemia   . Morbid obesity due to excess calories ([place])   . 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 Numbness and tingling in hands   . OSA (obstructive sleep apnea)    moderate OSA per study 11-22-2007--  refused CPAP but used oxygen for 6 months at night,  states due to wt loss stopped using oxygen  . Polyneuropathy, diabetic ([place])    WALKS W/ CANE FOR BALANCE  . 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 Pyonephrosis   . Pyonephrosis    . Radiculopathy of lumbar region   . Respiratory failure (HCC)    hx of acute respiratory failure wtih hypoxia   . Right ureteral stone   . Severe sepsis with septic shock (CODE) ([place])   . Shortness of breath dyspnea   . Spinal stenosis, cervical region   . SUI (stress urinary incontinence, female)   . Tubulo-interstitial nephritis   . Type 2 diabetes mellitus (HCC)    Type II - Diet controlled  . Urinary incontinence   . Urinary tract infection   . Wears glasses     Past Surgical History:  Procedure Laterality Date  . ANTERIOR CERVICAL DECOMP/DISCECTOMY FUSION N/A 03/23/2016   Procedure: Cervical Four-Five, Cervical Five-Six, Cervical Six-Seven Anterior cervical decompression/diskectomy/fusion;  Surgeon: Leeroy Cha, MD;  Location: [facility];  Service: Neurosurgery;  Laterality: N/A;  C4-5 C5-6 C6-7 Anterior cervical decompression/diskectomy/fusion  . CARDIOVASCULAR STRESS TEST  09-30-2007     normal Adenosine study/  no ischemia /  normal LV function and wall motion , ef 82%  . COLONOSCOPY    . CYSTOSCOPY WITH RETROGRADE PYELOGRAM, URETEROSCOPY AND STENT PLACEMENT Right 05/12/2015   Procedure: CYSTOSCOPY WITH RETROGRADE PYELOGRAM, URETEROSCOPY,STONE EXTRACTION AND STENT PLACEMENT;  Surgeon: Franchot Gallo, MD;  Location: [facility];  Service: Urology;  Laterality: Right;  . CYSTOSCOPY WITH RETROGRADE PYELOGRAM, URETEROSCOPY AND STENT PLACEMENT Right 04/25/2018   Procedure: CYSTOSCOPY WITH RETROGRADE PYELOGRAM, URETEROSCOPY AND STENT EXCHANGE;  Surgeon: Alexis Frock, MD;  Location: [facility];  Service: Urology;  Laterality: Right;  . CYSTOSCOPY WITH STENT PLACEMENT Right 03/28/2018   Procedure: CYSTOSCOPY WITH STENT PLACEMENT retrograde pylegram;  Surgeon: Alexis Frock, MD;  Location: [facility];  Service: Urology;  Laterality: Right;  . EXTRACORPOREAL SHOCK WAVE LITHOTRIPSY Right 08-18-2012  . HOLMIUM LASER APPLICATION Right 3/66/2947   Procedure: HOLMIUM LASER  APPLICATION;  Surgeon: Franchot Gallo, MD;  Location: [facility];  Service: Urology;  Laterality: Right;  . HOLMIUM LASER APPLICATION Right 6/54/6503   Procedure: HOLMIUM LASER APPLICATION;  Surgeon: Alexis Frock, MD;  [facility];  Service: Urology;  Laterality: Right;  . ORIF HUMERUS FRACTURE Right 07/20/2016   Procedure: OPEN REDUCTION INTERNAL FIXATION (ORIF) PROXIMAL HUMERUS FRACTURE VS REVERSE TOTAL SHOULDER ARTHROPLASTY;  Surgeon: Netta Cedars, MD;  Location: [facility];  Service: Orthopedics;  Laterality: Right;  . SHOULDER ARTHROSCOPY Right 07/2016  . TRANSTHORACIC ECHOCARDIOGRAM  09-23-2007   pseudonormal LV filling pattern,  ef 65-70%/  trivial AR/  mild LAE  . TUBAL LIGATION  1985    There were no vitals filed for this visit.  Subjective Assessment - 12/11/18 1113    Subjective  "I didn't (unintelligible) tired so."    Patient is accompained by:  --   [name] - friend   Currently in Pain?  Yes    Pain Score  3     Pain Location  Neck    Pain Orientation  Right    Pain Descriptors / Indicators  Aching    Pain Type  Chronic pain    Pain Onset  More than a month ago    Pain Frequency  Constant    Multiple Pain Sites  Yes    Pain Score  4    Pain Location  Knee    Pain Orientation  Left;Right    Pain Descriptors / Indicators  Sore    Pain Onset  More than a month ago    Pain Frequency  Constant    Aggravating Factors   nothing    Pain Relieving Factors  cortisone injections            ADULT SLP TREATMENT - 12/11/18 1115      General Information   Behavior/Cognition  Alert;Cooperative;Pleasant mood      Treatment Provided   Treatment provided  Cognitive-Linquistic      Cognitive-Linquistic Treatment   Treatment focused on  Dysarthria    Skilled Treatment  "No sir, let's just keep on working with the other stuff - so." (re: SLP asked pt if she wanted to work with swallowing today). SLP retrieved rubber band for pt to "ping" herself with  saying "so". Pt had difficulty refraining from this word in all spontaneous contexts today: structured tasks, sentence responses, or conversational speech. Performance was best with structured tasks when she reduced use approx 25% of the time.  With intelligibiliy strategies pt used compensations most in reading with SLP blocking half of the sentence (85%), reading without blocking the sentence (75%), and in structured tasks requiring one sentence responses (30%). Consistent cues with sentene responses with structured tasks increased success to 75%.      Assessment / Recommendations / Plan   Plan  Continue with current plan  of care      Progression Toward Goals   Progression toward goals  Progressing toward goals         SLP Short Term Goals - 12/09/18 1406      SLP SHORT TERM GOAL #1   Title  Pt will demonstrate carryover of swallowing precautions with regular solids, thin liquids x2 sessions with rare min A.     Status  Partially Met      SLP SHORT TERM GOAL #2   Title  Pt will ID short rushes of speech in 18/20 sentence responses x3 sessions.     Status  Not Met      SLP SHORT TERM GOAL #3   Title  Pt will use compensations for dysarthria in 8 minutes simple conversation x 2 sessions for >90% intelligibility, with occasional min A.    Status  Not Met       SLP Long Term Goals - 12/11/18 1206      SLP LONG TERM GOAL #1   Title  Pt will demo swallow compensations independently with regular solids, thin liquids x2 sessions.     Time  3    Period  Weeks    Status  On-going      SLP LONG TERM GOAL #2   Title  Pt will tell SLP 3 signs of aspiration PNA.    Time  3    Period  Weeks    Status  On-going      SLP LONG TERM GOAL #3   Title  Pt will report fewer requests to repeat herself/need for assistance to relay personal information (banking details) on community outings than prior to [place].    Time  3    Period  Weeks    Status  On-going      SLP LONG TERM GOAL #4   Title   Pt will demo awareness of short rushes of speech by attempting correction in 4/5 opportunities x 3 sessions.    Time  3    Period  Weeks    Status  On-going      SLP LONG TERM GOAL #5   Title  Pt will use compensations for dysarthria for >95% intelligibility in 8 minutes simple conversation x 3 visits (self-corrections allowed)    Time  3    Period  Weeks    Status  On-going       Plan - 12/11/18 1205    Clinical Impression Statement  Ms. 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 Today the option was given to work with her swallowing skills and pt declined. Speech is characterized by predominantly spastic and hypokinetic features: strained, strangled vocal quality, imprecise articulation, short rushes of speech, monopitch, and loudness decay with longer utterances. Her primary concern is her communication; she reports she has difficulty communicating in person with friends and caregiver, on the phone, and when running errands in the community.  I recommend skilled ST for training in aspiration precautions, swallow compensations, strategies and compensations for dysarthria in order to improve swallowing safety, intelligibility and quality of life.      Speech Therapy Frequency  2x / week    Duration  --   6 weeks or 13 visits   Treatment/Interventions  Aspiration precaution training;Diet toleration management by SLP;Cognitive reorganization;Compensatory strategies;Patient/family education;SLP instruction and feedback;Functional tasks;Internal/external aids;Cueing hierarchy;Compensatory techniques    Potential to Achieve Goals  Good  Potential Considerations  Severity of impairments       Patient will benefit from skilled therapeutic intervention in order to improve the following deficits and impairments:   Dysarthria and anarthria  Dysphagia, oropharyngeal  phase  Cognitive communication deficit    Problem List Patient Active Problem List   Diagnosis Date Noted  . Bradycardia 04/01/2018  . UTI (urinary tract infection) 04/01/2018  . ARF (acute renal failure) ([place]) 04/01/2018  . Right ureteral stone 04/01/2018  . Acute diastolic heart failure ([place]) 04/01/2018  . Acute respiratory failure with hypoxia ([place])   . Pyelonephritis   . Severe sepsis ([place])   . Pyohydronephrosis 03/29/2018  . Septic shock ([place]) 03/29/2018  . Hypokalemia 03/28/2018  . Severe sepsis with septic shock ([place]) 03/28/2018  . Elevated lactic acid level 03/28/2018  . Elevated troponin 03/28/2018  . PSP (progressive supranuclear palsy) ([place]) 02/18/2018  . Chronic cough 11/19/2017  . 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 Cervical stenosis of spinal canal 03/23/2016  . OSA on CPAP 12/14/2015  . Dizziness and giddiness 11/24/2013  . Diabetes type 2, uncontrolled ([place]) 11/24/2013  . Severe obesity (BMI >= 40) ([place]) 11/24/2013  . Lumbar radiculopathy 11/24/2013    [facility], CCC-SLP  12/11/2018, 12:07 PM  [facility] [address] [place], [place], [address] Phone: [phone]   Fax:  [phone]   Name: YENY SCHMOLL MRN: [id number] Date of Birth: 07/03/1951

## 2018-12-12 ENCOUNTER — Ambulatory Visit: Payer: PPO | Admitting: Neurology

## 2018-12-15 ENCOUNTER — Ambulatory Visit: Payer: PPO | Attending: Gastroenterology | Admitting: Speech Pathology

## 2018-12-15 DIAGNOSIS — R471 Dysarthria and anarthria: Secondary | ICD-10-CM | POA: Diagnosis not present

## 2018-12-15 DIAGNOSIS — R1312 Dysphagia, oropharyngeal phase: Secondary | ICD-10-CM | POA: Insufficient documentation

## 2018-12-15 DIAGNOSIS — R41841 Cognitive communication deficit: Secondary | ICD-10-CM | POA: Insufficient documentation

## 2018-12-15 NOTE — Therapy (Signed)
[facility] [address] [place], [place], [address] Phone: [phone]   Fax:  [phone]  Speech Language Pathology Treatment  Patient Details  Name: [name] [name] MRN: [id number] Date of Birth: 09-Apr-1951 Referring Provider (SLP): Doran Stabler, MD   Encounter Date: 12/15/2018  End of Session - 12/15/18 1046    Visit Number  9    Number of Visits  13    Date for SLP Re-Evaluation  02/16/19    SLP Start Time  0933    SLP Stop Time   1015    SLP Time Calculation (min)  42 min    Activity Tolerance  Patient tolerated treatment well       Past Medical History:  Diagnosis Date  . Acute kidney failure (HCC)    hx of   . Arthritis    fingers,right shoulder  . Bradycardia   . 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 CHF (congestive heart failure) (HCC)    acute diastolic ( congestive ) heart failure)   . Chronic lumbar radiculopathy    L5- S1  right leg  . Constipation   . Depression   . Diverticulosis large intestine w/o perforation or abscess w/bleeding   . Dizziness   . Dry eye syndrome of bilateral lacrimal glands   . Frequency of urination   . GERD (gastroesophageal reflux disease)   . Hard of hearing   . History of kidney stones 05/12/15   surgery   . HTN (hypertension)   . Hyperlipidemia   . Hypokalemia   . Morbid obesity due to excess calories ([place])   . 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 Numbness and tingling in hands   . OSA (obstructive sleep apnea)    moderate OSA per study 11-22-2007--  refused CPAP but used oxygen for 6 months at night,  states due to wt loss stopped using oxygen  . Polyneuropathy, diabetic ([place])    WALKS W/ CANE FOR BALANCE  . 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 Pyonephrosis   . Pyonephrosis   . Radiculopathy of lumbar region   .  Respiratory failure (HCC)    hx of acute respiratory failure wtih hypoxia   . Right ureteral stone   . Severe sepsis with septic shock (CODE) ([place])   . Shortness of breath dyspnea   . Spinal stenosis, cervical region   . SUI (stress urinary incontinence, female)   . Tubulo-interstitial nephritis   . Type 2 diabetes mellitus (HCC)    Type II - Diet controlled  . Urinary incontinence   . Urinary tract infection   . Wears glasses     Past Surgical History:  Procedure Laterality Date  . ANTERIOR CERVICAL DECOMP/DISCECTOMY FUSION N/A 03/23/2016   Procedure: Cervical Four-Five, Cervical Five-Six, Cervical Six-Seven Anterior cervical decompression/diskectomy/fusion;  Surgeon: Leeroy Cha, MD;  Location: [facility];  Service: Neurosurgery;  Laterality: N/A;  C4-5 C5-6 C6-7 Anterior cervical decompression/diskectomy/fusion  . CARDIOVASCULAR STRESS TEST  09-30-2007     normal Adenosine study/  no ischemia /  normal LV function and wall motion , ef 82%  . COLONOSCOPY    . CYSTOSCOPY WITH RETROGRADE PYELOGRAM, URETEROSCOPY AND STENT PLACEMENT Right 05/12/2015   Procedure: CYSTOSCOPY WITH RETROGRADE PYELOGRAM, URETEROSCOPY,STONE EXTRACTION AND STENT PLACEMENT;  Surgeon: Franchot Gallo, MD;  Location: [facility];  Service: Urology;  Laterality: Right;  . CYSTOSCOPY WITH RETROGRADE PYELOGRAM, URETEROSCOPY AND STENT PLACEMENT Right 04/25/2018   Procedure: CYSTOSCOPY WITH RETROGRADE PYELOGRAM, URETEROSCOPY AND STENT EXCHANGE;  Surgeon: Alexis Frock, MD;  [facility];  Service: Urology;  Laterality: Right;  . CYSTOSCOPY WITH STENT PLACEMENT Right 03/28/2018   Procedure: CYSTOSCOPY WITH STENT PLACEMENT retrograde pylegram;  Surgeon: Alexis Frock, MD;  [facility];  Service: Urology;  Laterality: Right;  . EXTRACORPOREAL SHOCK WAVE LITHOTRIPSY Right 08-18-2012  . HOLMIUM LASER APPLICATION Right 03/14/8881   Procedure: HOLMIUM LASER APPLICATION;  Surgeon: Franchot Gallo, MD;  Location: [facility];  Service: Urology;  Laterality: Right;  . HOLMIUM LASER APPLICATION Right 8/00/3491   Procedure: HOLMIUM LASER APPLICATION;  Surgeon: Alexis Frock, MD;  [facility];  Service: Urology;  Laterality: Right;  . ORIF HUMERUS FRACTURE Right 07/20/2016   Procedure: OPEN REDUCTION INTERNAL FIXATION (ORIF) PROXIMAL HUMERUS FRACTURE VS REVERSE TOTAL SHOULDER ARTHROPLASTY;  Surgeon: Netta Cedars, MD;  Location: [facility];  Service: Orthopedics;  Laterality: Right;  . SHOULDER ARTHROSCOPY Right 07/2016  . TRANSTHORACIC ECHOCARDIOGRAM  09-23-2007   pseudonormal LV filling pattern,  ef 65-70%/  trivial AR/  mild LAE  . TUBAL LIGATION  1985    There were no vitals filed for this visit.  Subjective Assessment - 12/15/18 0936    Subjective  "It does feel chilly, so."    Patient is accompained by:  --   friend [name]   Currently in Pain?  Yes    Pain Score  4     Pain Location  Hip    Pain Orientation  Left    Pain Descriptors / Indicators  Aching;Sore    Pain Radiating Towards  thigh            ADULT SLP TREATMENT - 12/15/18 0933      General Information   Behavior/Cognition  Alert;Cooperative;Pleasant mood      Treatment Provided   Treatment provided  Cognitive-Linquistic;Dysphagia      Dysphagia Treatment   Temperature Spikes Noted  No    Respiratory Status  Room air    Oral Cavity - Dentition  Adequate natural dentition    Treatment Methods  Compensation strategy training;Patient/caregiver education    Patient observed directly with PO's  No   pt did not wish to consume POs; pt to bring cereal/milk    Other treatment/comments  Swallow tx (12 min) Pt recalled 2 signs of aspiration PNA (fever, shortness of breath), with mod cues for additional symptoms. Pt recalled swallow precautions including slow rate, small bites/sips, and caution with mixed consistencies. Pt stated she is "mixing my cereal and milk together really well." SLP  educated with visual aids rationale for caution with mixed textures as pt appeared confused about this. Pt politely declined POs this date; SLP encouraged pt and [name] to bring a preferred item of mixed consistency to subsequent session to assess tolerance and train in use of compensations/precautions.      Pain Assessment   Pain Assessment  No/denies pain      Cognitive-Linquistic Treatment   Treatment focused on  Dysarthria    Skilled Treatment  Speech tx (30 min): Pt with near constant intrusions of "so" in spontaneous responses; she independently tapped her wrist, aware of these ~75% of the time. While pt awareness improving, tactile cuing not particularly effective in reducing intrusions. SLP used visual cue (word "so" written and crossed out) and placed in pt's  field of vision during conversation and structured tasks. Initially, this curbed occurences of "so" by ~50%. By end of session, pt habituated/overlooked this cue, and "so" intrusions increased somewhat. Worked to curb short rushes of speech with intelligibility strategies; pt typically aware (with question cues) of rapid rate. Verbal cues for corrections were not as successful as modeling strategies. In one-two sentence responses, pt used strategies adequately 50% of the time, with mod cues to correct remaining short rushes of speech or unintelligible responses.      Assessment / Recommendations / Plan   Plan  Continue with current plan of care;Goals updated   downgraded conversation goal     Progression Toward Goals   Progression toward goals  Progressing toward goals         SLP Short Term Goals - 12/15/18 0940      SLP SHORT TERM GOAL #1   Title  Pt will demonstrate carryover of swallowing precautions with regular solids, thin liquids x2 sessions with rare min A.     Status  Partially Met      SLP SHORT TERM GOAL #2   Title  Pt will ID short rushes of speech in 18/20 sentence responses x3 sessions.     Status  Not Met       SLP SHORT TERM GOAL #3   Title  Pt will use compensations for dysarthria in 8 minutes simple conversation x 2 sessions for >90% intelligibility, with occasional min A.    Status  Not Met       SLP Long Term Goals - 12/15/18 1048      SLP LONG TERM GOAL #1   Title  Pt will demo swallow compensations independently with regular solids, thin liquids x2 sessions.     Time  2    Period  Weeks    Status  On-going      SLP LONG TERM GOAL #2   Title  Pt will tell SLP 3 signs of aspiration PNA.    Time  2    Period  Weeks    Status  On-going      SLP LONG TERM GOAL #3   Title  Pt will report fewer requests to repeat herself/need for assistance to relay personal information (banking details) on community outings than prior to [place].    Time  2    Period  Weeks    Status  On-going      SLP LONG TERM GOAL #4   Title  Pt will demo awareness of short rushes of speech by attempting correction in 4/5 opportunities x 3 sessions.    Time  2    Period  Weeks    Status  On-going      SLP LONG TERM GOAL #5   Title  Pt will use compensations for dysarthria for >85% intelligibility in 8 minutes simple conversation x 3 visits (self-corrections allowed)    Time  2    Period  Weeks    Status  Revised       Plan - 12/15/18 1047    Clinical Impression Statement  Ms. 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 Pt continues to express desire to focus on speech vs swallowing; SLP reviewed precautions, signs of aspiration pneumonia, and rationale for these goals. Speech is characterized by predominantly spastic and hypokinetic features: strained, strangled vocal quality, imprecise articulation, short rushes of speech, monopitch, and loudness decay with  longer utterances. Her primary concern is her communication; she reports she has difficulty communicating in person with friends and  caregiver, on the phone, and when running errands in the community.  I recommend skilled ST for training in aspiration precautions, swallow compensations, strategies and compensations for dysarthria in order to improve swallowing safety, intelligibility and quality of life.      Speech Therapy Frequency  2x / week    Duration  --   6 weeks or 13 visits   Treatment/Interventions  Aspiration precaution training;Diet toleration management by SLP;Cognitive reorganization;Compensatory strategies;Patient/family education;SLP instruction and feedback;Functional tasks;Internal/external aids;Cueing hierarchy;Compensatory techniques    Potential to Achieve Goals  Good    Potential Considerations  Severity of impairments       Patient will benefit from skilled therapeutic intervention in order to improve the following deficits and impairments:   Dysarthria and anarthria  Dysphagia, oropharyngeal phase  Cognitive communication deficit    Problem List Patient Active Problem List   Diagnosis Date Noted  . Bradycardia 04/01/2018  . UTI (urinary tract infection) 04/01/2018  . ARF (acute renal failure) ([place]) 04/01/2018  . Right ureteral stone 04/01/2018  . Acute diastolic heart failure ([place]) 04/01/2018  . Acute respiratory failure with hypoxia ([place])   . Pyelonephritis   . Severe sepsis ([place])   . Pyohydronephrosis 03/29/2018  . Septic shock ([place]) 03/29/2018  . Hypokalemia 03/28/2018  . Severe sepsis with septic shock ([place]) 03/28/2018  . Elevated lactic acid level 03/28/2018  . Elevated troponin 03/28/2018  . PSP (progressive supranuclear palsy) ([place]) 02/18/2018  . Chronic cough 11/19/2017  . 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 Cervical stenosis of spinal canal 03/23/2016  . OSA on CPAP 12/14/2015  . Dizziness and giddiness 11/24/2013  . Diabetes type 2, uncontrolled ([place]) 11/24/2013  . Severe obesity (BMI >= 40) ([employer]) 11/24/2013  . Lumbar radiculopathy 11/24/2013   Deneise Lever,  [place], [place] 12/15/2018, 10:52 AM  [facility] [address] [place], [place], [address] Phone: [phone]   Fax:  [phone]   Name: [name] [name] MRN: [id number] Date of Birth: May 26, 1951

## 2018-12-17 ENCOUNTER — Ambulatory Visit: Payer: PPO

## 2018-12-17 DIAGNOSIS — R1312 Dysphagia, oropharyngeal phase: Secondary | ICD-10-CM

## 2018-12-17 DIAGNOSIS — R41841 Cognitive communication deficit: Secondary | ICD-10-CM

## 2018-12-17 DIAGNOSIS — R471 Dysarthria and anarthria: Secondary | ICD-10-CM | POA: Diagnosis not present

## 2018-12-17 NOTE — Patient Instructions (Signed)
Signs of Aspiration Pneumonia   . Chest pain/tightness . Fever (can be low grade) . Cough  o With foul-smelling phlegm (sputum) o With sputum containing pus or blood o With greenish sputum . Fatigue  . Shortness of breath  . Wheezing   **IF YOU HAVE THESE SIGNS, CONTACT YOUR DOCTOR OR GO TO THE EMERGENCY DEPARTMENT OR URGENT CARE AS SOON AS POSSIBLE**      

## 2018-12-17 NOTE — Therapy (Signed)
[facility] [address] [place], [place], [address] Phone: [phone]   Fax:  [phone]  Speech Language Pathology Treatment  Patient Details  Name: [name] [name] MRN: [id number] Date of Birth: 06/21/1951 Referring Provider (SLP): Doran Stabler, MD   Encounter Date: 12/17/2018  End of Session - 12/17/18 1040    Visit Number  10    Number of Visits  13    Date for SLP Re-Evaluation  02/16/19    SLP Start Time  0933    SLP Stop Time   1015    SLP Time Calculation (min)  42 min    Activity Tolerance  Patient tolerated treatment well       Past Medical History:  Diagnosis Date  . Acute kidney failure (HCC)    hx of   . Arthritis    fingers,right shoulder  . Bradycardia   . 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 CHF (congestive heart failure) (HCC)    acute diastolic ( congestive ) heart failure)   . Chronic lumbar radiculopathy    L5- S1  right leg  . Constipation   . Depression   . Diverticulosis large intestine w/o perforation or abscess w/bleeding   . Dizziness   . Dry eye syndrome of bilateral lacrimal glands   . Frequency of urination   . GERD (gastroesophageal reflux disease)   . Hard of hearing   . History of kidney stones 05/12/15   surgery   . HTN (hypertension)   . Hyperlipidemia   . Hypokalemia   . Morbid obesity due to excess calories ([place])   . 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 Numbness and tingling in hands   . OSA (obstructive sleep apnea)    moderate OSA per study 11-22-2007--  refused CPAP but used oxygen for 6 months at night,  states due to wt loss stopped using oxygen  . Polyneuropathy, diabetic ([place])    WALKS W/ CANE FOR BALANCE  . 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 Pyonephrosis   . Pyonephrosis   . Radiculopathy of lumbar region   .  Respiratory failure (HCC)    hx of acute respiratory failure wtih hypoxia   . Right ureteral stone   . Severe sepsis with septic shock (CODE) ([place])   . Shortness of breath dyspnea   . Spinal stenosis, cervical region   . SUI (stress urinary incontinence, female)   . Tubulo-interstitial nephritis   . Type 2 diabetes mellitus (HCC)    Type II - Diet controlled  . Urinary incontinence   . Urinary tract infection   . Wears glasses     Past Surgical History:  Procedure Laterality Date  . ANTERIOR CERVICAL DECOMP/DISCECTOMY FUSION N/A 03/23/2016   Procedure: Cervical Four-Five, Cervical Five-Six, Cervical Six-Seven Anterior cervical decompression/diskectomy/fusion;  Surgeon: Leeroy Cha, MD;  Location: [facility];  Service: Neurosurgery;  Laterality: N/A;  C4-5 C5-6 C6-7 Anterior cervical decompression/diskectomy/fusion  . CARDIOVASCULAR STRESS TEST  09-30-2007     normal Adenosine study/  no ischemia /  normal LV function and wall motion , ef 82%  . COLONOSCOPY    . CYSTOSCOPY WITH RETROGRADE PYELOGRAM, URETEROSCOPY AND STENT PLACEMENT Right 05/12/2015   Procedure: CYSTOSCOPY WITH RETROGRADE PYELOGRAM, URETEROSCOPY,STONE EXTRACTION AND STENT PLACEMENT;  Surgeon: Franchot Gallo, MD;  Location: [facility];  Service: Urology;  Laterality: Right;  . CYSTOSCOPY WITH RETROGRADE PYELOGRAM, URETEROSCOPY AND STENT PLACEMENT Right 04/25/2018   Procedure: CYSTOSCOPY WITH RETROGRADE PYELOGRAM, URETEROSCOPY AND STENT EXCHANGE;  Surgeon: Alexis Frock, MD;  Location: [facility];  Service: Urology;  Laterality: Right;  . CYSTOSCOPY WITH STENT PLACEMENT Right 03/28/2018   Procedure: CYSTOSCOPY WITH STENT PLACEMENT retrograde pylegram;  Surgeon: Alexis Frock, MD;  Location: [facility];  Service: Urology;  Laterality: Right;  . EXTRACORPOREAL SHOCK WAVE LITHOTRIPSY Right 08-18-2012  . HOLMIUM LASER APPLICATION Right 6/83/4196   Procedure: HOLMIUM LASER APPLICATION;  Surgeon: Franchot Gallo, MD;  Location: [facility];  Service: Urology;  Laterality: Right;  . HOLMIUM LASER APPLICATION Right 01/03/9797   Procedure: HOLMIUM LASER APPLICATION;  Surgeon: Alexis Frock, MD;  [facility];  Service: Urology;  Laterality: Right;  . ORIF HUMERUS FRACTURE Right 07/20/2016   Procedure: OPEN REDUCTION INTERNAL FIXATION (ORIF) PROXIMAL HUMERUS FRACTURE VS REVERSE TOTAL SHOULDER ARTHROPLASTY;  Surgeon: Netta Cedars, MD;  Location: [facility];  Service: Orthopedics;  Laterality: Right;  . SHOULDER ARTHROSCOPY Right 07/2016  . TRANSTHORACIC ECHOCARDIOGRAM  09-23-2007   pseudonormal LV filling pattern,  ef 65-70%/  trivial AR/  mild LAE  . TUBAL LIGATION  1985    There were no vitals filed for this visit.  Subjective Assessment - 12/17/18 0940    Currently in Pain?  Yes    Pain Score  2     Pain Location  Neck    Pain Orientation  Right    Pain Descriptors / Indicators  Aching;Sore;Throbbing;Tightness    Pain Type  Chronic pain    Pain Onset  More than a month ago    Pain Frequency  Constant    Aggravating Factors   nothing    Pain Relieving Factors  biofreeze gel            ADULT SLP TREATMENT - 12/17/18 0942      General Information   Behavior/Cognition  Alert;Cooperative;Pleasant mood      Treatment Provided   Treatment provided  Cognitive-Linquistic      Cognitive-Linquistic Treatment   Treatment focused on  Dysarthria    Skilled Treatment  pt prefers to work on speech today and not swallowing; she did not bring cereal/milk. "I thought she meant bring it next time I see her, so." Pt states her speech is better and states [name] (friend) and [name] (friend) have both commented positively on pt's speech. SLP ascertained pt had not attempted an external cue and so SLP encouraged pt to do so. Pt read overt s/s aspiration with 85% success slowed rate. SLP asked pt questions to elicit 1-2 sentence responses and pt req'd usual demo cues for slowed rate. Pt  noted to attempt slowed rate more often than last appt with this SLP.       Assessment / Recommendations / Plan   Plan  Continue with current plan of care;Goals updated      Progression Toward Goals   Progression toward goals  Progressing toward goals       SLP Education - 12/17/18 0950    Education Details  overt s/s aspiration PNA    Person(s) Educated  Patient;Caregiver(s)    Methods  Explanation;Handout    Comprehension  Verbalized understanding       SLP Short Term Goals - 12/15/18 0940      SLP SHORT TERM GOAL #1   Title  Pt will demonstrate carryover of swallowing precautions  with regular solids, thin liquids x2 sessions with rare min A.     Status  Partially Met      SLP SHORT TERM GOAL #2   Title  Pt will ID short rushes of speech in 18/20 sentence responses x3 sessions.     Status  Not Met      SLP SHORT TERM GOAL #3   Title  Pt will use compensations for dysarthria in 8 minutes simple conversation x 2 sessions for >90% intelligibility, with occasional min A.    Status  Not Met       SLP Long Term Goals - 12/17/18 1041      SLP LONG TERM GOAL #1   Title  Pt will demo swallow compensations independently with regular solids, thin liquids x2 sessions.     Time  2    Period  Weeks    Status  On-going      SLP LONG TERM GOAL #2   Title  Pt will tell SLP 3 signs of aspiration PNA.    Status  Achieved      SLP LONG TERM GOAL #3   Title  Pt will report fewer requests to repeat herself/need for assistance to relay personal information (banking details) on community outings than prior to [place].    Time  2    Period  Weeks    Status  On-going      SLP LONG TERM GOAL #4   Title  Pt will demo awareness of short rushes of speech by attempting correction in 4/5 opportunities x 3 sessions.    Time  2    Period  Weeks    Status  On-going      SLP LONG TERM GOAL #5   Title  Pt will use compensations for dysarthria for >85% intelligibility in 8 minutes simple  conversation x 3 visits (self-corrections allowed)    Time  2    Period  Weeks    Status  Revised       Plan - 12/17/18 1041    Clinical Impression Statement  Ms. 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 She reports today that friends have positively commented on her speech since last session. Pt continues to express desire to focus on speech vs swallowing; SLP reviewed precautions, signs of aspiration pneumonia, and rationale for these goals. Speech is characterized by predominantly spastic and hypokinetic features: strained, strangled vocal quality, imprecise articulation, short rushes of speech, monopitch, and loudness decay with longer utterances. Her primary concern is her communication; she reports she has difficulty communicating in person with friends and caregiver, on the phone, and when running errands in the community.  I recommend skilled ST for training in aspiration precautions, swallow compensations, strategies and compensations for dysarthria in order to improve swallowing safety, intelligibility and quality of life.      Speech Therapy Frequency  2x / week    Duration  --   6 weeks or 13 visits   Treatment/Interventions  Aspiration precaution training;Diet toleration management by SLP;Cognitive reorganization;Compensatory strategies;Patient/family education;SLP instruction and feedback;Functional tasks;Internal/external aids;Cueing hierarchy;Compensatory techniques    Potential to Achieve Goals  Good    Potential Considerations  Severity of impairments       Patient will benefit from skilled therapeutic intervention in order to improve the following deficits and impairments:   Dysarthria and anarthria  Dysphagia, oropharyngeal phase  Cognitive communication deficit   Speech  Therapy Progress Note  Dates of Reporting Period: 11-17-18 to  present  Subjective: Pt has been seen for 10 visits ST focusing mainly on speech intelligibility but also on pt's swallow safety.  Objective Measurements: See "skilled intervention"  Goal Update: See goal update  Plan: See pt for2-3 more sessions to solidify her ability for slowed rate as best as pt can, as well as ensure pt has tools to maintain pulmonary health/safe swallowing.  Reason Skilled Services are Required: Pt requires cues for speech intelligibility and for swallow safety. MAx rehab potential has not yet been met.   Problem List Patient Active Problem List   Diagnosis Date Noted  . Bradycardia 04/01/2018  . UTI (urinary tract infection) 04/01/2018  . ARF (acute renal failure) ([place]) 04/01/2018  . Right ureteral stone 04/01/2018  . Acute diastolic heart failure ([place]) 04/01/2018  . Acute respiratory failure with hypoxia ([place])   . Pyelonephritis   . Severe sepsis ([place])   . Pyohydronephrosis 03/29/2018  . Septic shock ([place]) 03/29/2018  . Hypokalemia 03/28/2018  . Severe sepsis with septic shock ([place]) 03/28/2018  . Elevated lactic acid level 03/28/2018  . Elevated troponin 03/28/2018  . PSP (progressive supranuclear palsy) ([place]) 02/18/2018  . Chronic cough 11/19/2017  . 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 Cervical stenosis of spinal canal 03/23/2016  . OSA on CPAP 12/14/2015  . Dizziness and giddiness 11/24/2013  . Diabetes type 2, uncontrolled ([place]) 11/24/2013  . Severe obesity (BMI >= 40) ([place]) 11/24/2013  . Lumbar radiculopathy 11/24/2013    [facility] ,MS, CCC-SLP  12/17/2018, 10:42 AM  [facility] [address] [place], [place], [address] Phone: [phone]   Fax:  [phone]   Name: ALIYANNA WASSMER MRN: [id number] Date of Birth: 1951-06-09

## 2018-12-18 ENCOUNTER — Other Ambulatory Visit: Payer: PPO | Admitting: Internal Medicine

## 2018-12-22 ENCOUNTER — Ambulatory Visit: Payer: PPO | Admitting: Speech Pathology

## 2018-12-22 DIAGNOSIS — R471 Dysarthria and anarthria: Secondary | ICD-10-CM | POA: Diagnosis not present

## 2018-12-22 DIAGNOSIS — R41841 Cognitive communication deficit: Secondary | ICD-10-CM

## 2018-12-22 DIAGNOSIS — R1312 Dysphagia, oropharyngeal phase: Secondary | ICD-10-CM

## 2018-12-22 NOTE — Therapy (Signed)
[facility] [address] [place], [place], [address] Phone: [phone]   Fax:  [phone]  Speech Language Pathology Treatment  Patient Details  Name: [name] [name] MRN: [id number] Date of Birth: [birth year] Referring Provider (SLP): Doran Stabler, MD   Encounter Date: 12/22/2018  End of Session - 12/22/18 0938    Visit Number  11    Number of Visits  13    Date for SLP Re-Evaluation  02/16/19    Authorization Type  Medicare/HT Advantage    SLP Start Time  0921    SLP Stop Time   1010    SLP Time Calculation (min)  49 min    Activity Tolerance  Patient tolerated treatment well       Past Medical History:  Diagnosis Date  . Acute kidney failure (HCC)    hx of   . Arthritis    fingers,right shoulder  . Bradycardia   . 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 CHF (congestive heart failure) (HCC)    acute diastolic ( congestive ) heart failure)   . Chronic lumbar radiculopathy    L5- S1  right leg  . Constipation   . Depression   . Diverticulosis large intestine w/o perforation or abscess w/bleeding   . Dizziness   . Dry eye syndrome of bilateral lacrimal glands   . Frequency of urination   . GERD (gastroesophageal reflux disease)   . Hard of hearing   . History of kidney stones 05/12/15   surgery   . HTN (hypertension)   . Hyperlipidemia   . Hypokalemia   . Morbid obesity due to excess calories ([place])   . 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 Numbness and tingling in hands   . OSA (obstructive sleep apnea)    moderate OSA per study 11-22-2007--  refused CPAP but used oxygen for 6 months at night,  states due to wt loss stopped using oxygen  . Polyneuropathy, diabetic ([place])    WALKS W/ CANE FOR BALANCE  . 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 Progressive supranuclear palsy ([place])   . Pyonephrosis   . Pyonephrosis    . Radiculopathy of lumbar region   . Respiratory failure (HCC)    hx of acute respiratory failure wtih hypoxia   . Right ureteral stone   . Severe sepsis with septic shock (CODE) ([place])   . Shortness of breath dyspnea   . Spinal stenosis, cervical region   . SUI (stress urinary incontinence, female)   . Tubulo-interstitial nephritis   . Type 2 diabetes mellitus (HCC)    Type II - Diet controlled  . Urinary incontinence   . Urinary tract infection   . Wears glasses     Past Surgical History:  Procedure Laterality Date  . ANTERIOR CERVICAL DECOMP/DISCECTOMY FUSION N/A 03/23/2016   Procedure: Cervical Four-Five, Cervical Five-Six, Cervical Six-Seven Anterior cervical decompression/diskectomy/fusion;  Surgeon: Leeroy Cha, MD;  Location: [facility];  Service: Neurosurgery;  Laterality: N/A;  C4-5 C5-6 C6-7 Anterior cervical decompression/diskectomy/fusion  . CARDIOVASCULAR STRESS TEST  09-30-2007     normal Adenosine study/  no ischemia /  normal LV function and wall motion , ef 82%  . COLONOSCOPY    . CYSTOSCOPY WITH RETROGRADE PYELOGRAM, URETEROSCOPY AND STENT PLACEMENT Right 05/12/2015   Procedure: CYSTOSCOPY WITH RETROGRADE PYELOGRAM, URETEROSCOPY,STONE EXTRACTION AND STENT PLACEMENT;  Surgeon: Franchot Gallo, MD;  [facility];  Service: Urology;  Laterality: Right;  . CYSTOSCOPY WITH RETROGRADE PYELOGRAM, URETEROSCOPY AND STENT PLACEMENT Right 04/25/2018   Procedure: CYSTOSCOPY WITH RETROGRADE PYELOGRAM, URETEROSCOPY AND STENT EXCHANGE;  Surgeon: Alexis Frock, MD;  [facility];  Service: Urology;  Laterality: Right;  . CYSTOSCOPY WITH STENT PLACEMENT Right 03/28/2018   Procedure: CYSTOSCOPY WITH STENT PLACEMENT retrograde pylegram;  Surgeon: Alexis Frock, MD;  [facility];  Service: Urology;  Laterality: Right;  . EXTRACORPOREAL SHOCK WAVE LITHOTRIPSY Right 08-18-2012  . HOLMIUM LASER APPLICATION Right 2/82/0601   Procedure: HOLMIUM LASER  APPLICATION;  Surgeon: Franchot Gallo, MD;  Location: [facility];  Service: Urology;  Laterality: Right;  . HOLMIUM LASER APPLICATION Right 5/61/5379   Procedure: HOLMIUM LASER APPLICATION;  Surgeon: Alexis Frock, MD;  [facility];  Service: Urology;  Laterality: Right;  . ORIF HUMERUS FRACTURE Right 07/20/2016   Procedure: OPEN REDUCTION INTERNAL FIXATION (ORIF) PROXIMAL HUMERUS FRACTURE VS REVERSE TOTAL SHOULDER ARTHROPLASTY;  Surgeon: Netta Cedars, MD;  Location: [facility];  Service: Orthopedics;  Laterality: Right;  . SHOULDER ARTHROSCOPY Right 07/2016  . TRANSTHORACIC ECHOCARDIOGRAM  09-23-2007   pseudonormal LV filling pattern,  ef 65-70%/  trivial AR/  mild LAE  . TUBAL LIGATION  1985    There were no vitals filed for this visit.  Subjective Assessment - 12/22/18 0925    Subjective  Pt arrives with cereal and milk.    Patient is accompained by:  --   friend [name]   Currently in Pain?  Yes    Pain Score  4     Pain Location  Leg    Pain Orientation  Left    Pain Descriptors / Indicators  Aching;Sore    Pain Type  Chronic pain    Pain Onset  More than a month ago            ADULT SLP TREATMENT - 12/22/18 0921      General Information   Behavior/Cognition  Alert;Cooperative;Pleasant mood      Treatment Provided   Treatment provided  Dysphagia;Cognitive-Linquistic      Dysphagia Treatment   Temperature Spikes Noted  No    Respiratory Status  Room air    Oral Cavity - Dentition  Adequate natural dentition    Treatment Methods  Compensation strategy training;Patient/caregiver education;Skilled observation    Patient observed directly with PO's  Yes    Type of PO's observed  --   mixed: regular and thin liquid   Feeding  Needs set up    Liquids provided via  Cup;Teaspoon    Oral Phase Signs & Symptoms  Prolonged mastication   raisins   Pharyngeal Phase Signs & Symptoms  Immediate cough   suspect premature spillage with solid   Type of cueing   Verbal    Amount of cueing  Minimal    Other treatment/comments  (Dysphagia tx, 20 min) Pt recalled swallow precautions with mixed consistencies independently (small bites, allow cereal to absorb liquid, drain milk from spoon). Pt demo'd precautions independently, with exception of taking additional bite while still masticating previous bite. Subsequently, pt with immediate coughing episode; SLP suspects posterior loss due to discoordination. SLP reinforced need to clear oral cavity prior to taking additional bites. Pt recalled 3 s/sx of aspiration PNA independently.       Cognitive-Linquistic Treatment   Treatment focused on  Dysarthria    Skilled Treatment  (Speech tx, 29 min) Pt  requiring usual cues in spontaneous responses for rate and extraneous "so." SLP began with structured word level task to increase pt's awareness and habituate slower rate. Rapid rate persisted in phrase level tasks with cognitive load, so SLP decreased task complexity providing written cues to decrease cognitive burden. Pt then avoided "so" at end of utterance 85% of the time, and used slow rate 95% of the time, independently. Progressing to spontaneous responses (phrase and sentence level), pt required usual mod A and demonstration initially; faded to support with therapist continuing to model strategies in conversation).      Assessment / Recommendations / Plan   Plan  Continue with current plan of care;Goals updated   goals downgraded      Dysphagia Recommendations   Diet recommendations  Regular;Dysphagia 3 (mechanical soft);Thin liquid    Liquids provided via  Cup    Medication Administration  Whole meds with puree    Supervision  Patient able to self feed    Compensations  Slow rate;Small sips/bites   use caution with mixed consistencies   Postural Changes and/or Swallow Maneuvers  Seated upright 90 degrees      Progression Toward Goals   Progression toward goals  Progressing toward goals       SLP  Education - 12/22/18 1045    Education Details  clear oral cavity prior to taking additional bites    Person(s) Educated  Patient;Caregiver(s)   [name]   Methods  Explanation;Verbal cues    Comprehension  Verbalized understanding       SLP Short Term Goals - 12/22/18 0930      SLP SHORT TERM GOAL #1   Title  Pt will demonstrate carryover of swallowing precautions with regular solids, thin liquids x2 sessions with rare min A.     Status  Partially Met      SLP SHORT TERM GOAL #2   Title  Pt will ID short rushes of speech in 18/20 sentence responses x3 sessions.     Status  Not Met      SLP SHORT TERM GOAL #3   Title  Pt will use compensations for dysarthria in 8 minutes simple conversation x 2 sessions for >90% intelligibility, with occasional min A.    Status  Not Met       SLP Long Term Goals - 12/22/18 0930      SLP LONG TERM GOAL #1   Title  Pt will demo swallow compensations independently with regular solids, thin liquids x2 sessions.     Baseline  12/22/18    Time  1   or 13 visits, for all LTGs   Period  Weeks    Status  On-going      SLP LONG TERM GOAL #2   Title  Pt will tell SLP 3 signs of aspiration PNA.    Time  2    Period  Weeks    Status  Achieved      SLP LONG TERM GOAL #3   Title  Pt will report fewer requests to repeat herself/need for assistance to relay personal information (banking details) on community outings than prior to [place].    Time  1    Period  Weeks    Status  Achieved      SLP LONG TERM GOAL #4   Title  Pt will demo awareness of short rushes of speech by attempting correction in 4/5 opportunities x 2 sessions with usual min cues.    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 She continues to report positive comments on her speech and fewer requests to repeat herself in public. Pt independently recalled precautions and implemented today 95% success with mixed consistencies; cough x1 noted due to suspected posterior loss. Usual mod cues required for dysarthria strategies and to curb use of "so" in spontaneous responses. Anticipate pt will require some level of cuing from communication partner given severity of deficits and increased difficulty maintaining strategies with cognitive burden.  I recommend skilled ST for training in aspiration precautions, swallow compensations, strategies and compensations for dysarthria in order to improve swallowing safety, intelligibility and quality of life. Anticipate d/c in next 1-2 visits.    Speech Therapy Frequency  2x / week    Duration  --   6 weeks or 13 visits   Treatment/Interventions  Aspiration precaution training;Diet toleration management by SLP;Cognitive reorganization;Compensatory strategies;Patient/family education;SLP instruction and feedback;Functional tasks;Internal/external aids;Cueing hierarchy;Compensatory techniques    Potential to Achieve Goals  Good    Potential Considerations  Severity of impairments       Patient will benefit from skilled therapeutic intervention in order to improve the following deficits and impairments:   Dysarthria and anarthria  Dysphagia, oropharyngeal phase  Cognitive communication deficit    Problem List Patient Active Problem List   Diagnosis Date Noted  . Bradycardia 04/01/2018  . UTI (urinary tract infection) 04/01/2018  . ARF (acute renal failure) ([place]) 04/01/2018  . Right ureteral stone 04/01/2018  . Acute diastolic heart failure ([place]) 04/01/2018  . Acute respiratory failure with  hypoxia ([place])   . Pyelonephritis   . Severe sepsis ([place])   . Pyohydronephrosis 03/29/2018  . Septic shock ([place]) 03/29/2018  . Hypokalemia 03/28/2018  . Severe sepsis with septic shock ([place]) 03/28/2018  . Elevated lactic acid level 03/28/2018  . Elevated troponin 03/28/2018  . PSP (progressive supranuclear palsy) ([place]) 02/18/2018  . Chronic cough 11/19/2017  . 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 Cervical stenosis of spinal canal 03/23/2016  . OSA on CPAP 12/14/2015  . Dizziness and giddiness 11/24/2013  . Diabetes type 2, uncontrolled ([place]) 11/24/2013  . Severe obesity (BMI >= 40) ([place]) 11/24/2013  . Lumbar radiculopathy 11/24/2013   Deneise Lever, [place], [place] 12/22/2018, 10:53 AM  [facility] [address] [place], [place], [address] Phone: [phone]   Fax:  [phone]   Name: [name] [name] MRN: [id number] Date of Birth: 07-Oct-1951

## 2018-12-23 DIAGNOSIS — E1140 Type 2 diabetes mellitus with diabetic neuropathy, unspecified: Secondary | ICD-10-CM | POA: Diagnosis not present

## 2018-12-23 DIAGNOSIS — E119 Type 2 diabetes mellitus without complications: Secondary | ICD-10-CM | POA: Diagnosis not present

## 2018-12-25 ENCOUNTER — Ambulatory Visit: Payer: PPO | Admitting: Speech Pathology

## 2018-12-25 DIAGNOSIS — R41841 Cognitive communication deficit: Secondary | ICD-10-CM

## 2018-12-25 DIAGNOSIS — R471 Dysarthria and anarthria: Secondary | ICD-10-CM | POA: Diagnosis not present

## 2018-12-25 DIAGNOSIS — R1312 Dysphagia, oropharyngeal phase: Secondary | ICD-10-CM

## 2018-12-25 NOTE — Therapy (Signed)
[facility] [address] [place], [place], [address] Phone: [phone]   Fax:  [phone]  Speech Language Pathology Treatment  Patient Details  Name: [name] [name] MRN: [id number] Date of Birth: 23-Nov-1950 Referring Provider (SLP): Doran Stabler, MD   Encounter Date: 12/25/2018  End of Session - 12/25/18 1100    Visit Number  12    Number of Visits  13    Date for SLP Re-Evaluation  02/16/19    Authorization Type  Medicare/HT Advantage    SLP Start Time  1002    SLP Stop Time   1051    SLP Time Calculation (min)  49 min    Activity Tolerance  Patient tolerated treatment well       Past Medical History:  Diagnosis Date  . Acute kidney failure (HCC)    hx of   . Arthritis    fingers,right shoulder  . Bradycardia   . 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 CHF (congestive heart failure) (HCC)    acute diastolic ( congestive ) heart failure)   . Chronic lumbar radiculopathy    L5- S1  right leg  . Constipation   . Depression   . Diverticulosis large intestine w/o perforation or abscess w/bleeding   . Dizziness   . Dry eye syndrome of bilateral lacrimal glands   . Frequency of urination   . GERD (gastroesophageal reflux disease)   . Hard of hearing   . History of kidney stones 05/12/15   surgery   . HTN (hypertension)   . Hyperlipidemia   . Hypokalemia   . Morbid obesity due to excess calories ([place])   . 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 Numbness and tingling in hands   . OSA (obstructive sleep apnea)    moderate OSA per study 11-22-2007--  refused CPAP but used oxygen for 6 months at night,  states due to wt loss stopped using oxygen  . Polyneuropathy, diabetic ([place])    WALKS W/ CANE FOR BALANCE  . 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 Pyonephrosis   . Pyonephrosis    . Radiculopathy of lumbar region   . Respiratory failure (HCC)    hx of acute respiratory failure wtih hypoxia   . Right ureteral stone   . Severe sepsis with septic shock (CODE) ([place])   . Shortness of breath dyspnea   . Spinal stenosis, cervical region   . SUI (stress urinary incontinence, female)   . Tubulo-interstitial nephritis   . Type 2 diabetes mellitus (HCC)    Type II - Diet controlled  . Urinary incontinence   . Urinary tract infection   . Wears glasses     Past Surgical History:  Procedure Laterality Date  . ANTERIOR CERVICAL DECOMP/DISCECTOMY FUSION N/A 03/23/2016   Procedure: Cervical Four-Five, Cervical Five-Six, Cervical Six-Seven Anterior cervical decompression/diskectomy/fusion;  Surgeon: Leeroy Cha, MD;  Location: [facility];  Service: Neurosurgery;  Laterality: N/A;  C4-5 C5-6 C6-7 Anterior cervical decompression/diskectomy/fusion  . CARDIOVASCULAR STRESS TEST  09-30-2007     normal Adenosine study/  no ischemia /  normal LV function and wall motion , ef 82%  . COLONOSCOPY    . CYSTOSCOPY WITH RETROGRADE PYELOGRAM, URETEROSCOPY AND STENT PLACEMENT Right 05/12/2015   Procedure: CYSTOSCOPY WITH RETROGRADE PYELOGRAM, URETEROSCOPY,STONE EXTRACTION AND STENT PLACEMENT;  Surgeon: Franchot Gallo, MD;  Location: [facility];  Service: Urology;  Laterality: Right;  . CYSTOSCOPY WITH RETROGRADE PYELOGRAM, URETEROSCOPY AND STENT PLACEMENT Right 04/25/2018   Procedure: CYSTOSCOPY WITH RETROGRADE PYELOGRAM, URETEROSCOPY AND STENT EXCHANGE;  Surgeon: Alexis Frock, MD;  Location: [facility];  Service: Urology;  Laterality: Right;  . CYSTOSCOPY WITH STENT PLACEMENT Right 03/28/2018   Procedure: CYSTOSCOPY WITH STENT PLACEMENT retrograde pylegram;  Surgeon: Alexis Frock, MD;  Location: [facility];  Service: Urology;  Laterality: Right;  . EXTRACORPOREAL SHOCK WAVE LITHOTRIPSY Right 08-18-2012  . HOLMIUM LASER APPLICATION Right 8/67/6195   Procedure: HOLMIUM LASER  APPLICATION;  Surgeon: Franchot Gallo, MD;  Location: [facility];  Service: Urology;  Laterality: Right;  . HOLMIUM LASER APPLICATION Right 0/93/2671   Procedure: HOLMIUM LASER APPLICATION;  Surgeon: Alexis Frock, MD;  Location: [facility];  Service: Urology;  Laterality: Right;  . ORIF HUMERUS FRACTURE Right 07/20/2016   Procedure: OPEN REDUCTION INTERNAL FIXATION (ORIF) PROXIMAL HUMERUS FRACTURE VS REVERSE TOTAL SHOULDER ARTHROPLASTY;  Surgeon: Netta Cedars, MD;  Location: [facility];  Service: Orthopedics;  Laterality: Right;  . SHOULDER ARTHROSCOPY Right 07/2016  . TRANSTHORACIC ECHOCARDIOGRAM  09-23-2007   pseudonormal LV filling pattern,  ef 65-70%/  trivial AR/  mild LAE  . TUBAL LIGATION  1985    There were no vitals filed for this visit.         ADULT SLP TREATMENT - 12/25/18 1046      General Information   Behavior/Cognition  Alert;Cooperative;Pleasant mood      Treatment Provided   Treatment provided  Dysphagia;Cognitive-Linquistic      Dysphagia Treatment   Temperature Spikes Noted  No    Respiratory Status  Room air    Oral Cavity - Dentition  Adequate natural dentition    Treatment Methods  Compensation strategy training;Patient/caregiver education;Skilled observation    Patient observed directly with PO's  Yes    Type of PO's observed  Regular;Thin liquids    Feeding  Able to feed self;Needs set up   minimal assist for opening packages   Liquids provided via  Straw    Oral Phase Signs & Symptoms  --   none noted   Pharyngeal Phase Signs & Symptoms  Immediate cough   after larger sip/still masticating solid   Type of cueing  Verbal    Amount of cueing  Minimal    Other treatment/comments  Swallowing (17 min). Pt consumed regular solids and thin liquids with use of aspiration precautions, with exception of one large sip of liquid while still masticating solid, which resulted in cough x1. After SLP provided feedback and reminders for strategies, pt  consumed remainder of snack with independent use of precautions. Pt reports she is still coughing at mealtimes. With inquiry, SLP noted pt eats meals in the dining room, frequently conversing during meals. Education provided re: focusing on using precautions when eating, encouraging pt to take breaks from eating if she is having conversations with others at the table. Pt agreed this would help her focus better on using compensations at mealtimes.       Cognitive-Linquistic Treatment   Treatment focused on  Dysarthria    Skilled Treatment  Speech tx (32 min). SLP worked with pt on slowing rate in conversation. Simple conversation with SLP (5 min), pt required usual visual and occasional verbal cues to use dysarthria strategies. Faded support to visual cues only. In subsequent 5 min and then 10 min conversation with SLP and friend  [name], pt responded to visual cues by correcting short rushes of speech (usual min A), >95% intelligibility. Pt also made 2 self-corrections and facial expression indicated awareness of approximately 70% of short rushes of speech.      Assessment / Recommendations / Plan   Plan  Continue with current plan of care;Goals updated      Dysphagia Recommendations   Diet recommendations  Regular;Thin liquid    Liquids provided via  Cup;Straw    Medication Administration  Whole meds with puree    Supervision  Patient able to self feed    Compensations  Slow rate;Small sips/bites   caution with mixed consistency   Postural Changes and/or Swallow Maneuvers  Seated upright 90 degrees      Progression Toward Goals   Progression toward goals  Progressing toward goals       SLP Education - 12/25/18 1100    Education Details  swallow compensations; take breaks for conversation at mealtimes, focus on precautions/no conversation when eating    Person(s) Educated  Patient   friend [name]   Methods  Explanation;Handout;Verbal cues    Comprehension  Verbalized understanding;Need further  instruction       SLP Short Term Goals - 12/22/18 0930      SLP SHORT TERM GOAL #1   Title  Pt will demonstrate carryover of swallowing precautions with regular solids, thin liquids x2 sessions with rare min A.     Status  Partially Met      SLP SHORT TERM GOAL #2   Title  Pt will ID short rushes of speech in 18/20 sentence responses x3 sessions.     Status  Not Met      SLP SHORT TERM GOAL #3   Title  Pt will use compensations for dysarthria in 8 minutes simple conversation x 2 sessions for >90% intelligibility, with occasional min A.    Status  Not Met       SLP Long Term Goals - 12/25/18 1028      SLP LONG TERM GOAL #1   Title  Pt will demo swallow compensations independently with regular solids, thin liquids x2 sessions.     Baseline  12/22/18    Time  1   13 visits for all LTGs   Status  On-going      SLP LONG TERM GOAL #2   Title  Pt will tell SLP 3 signs of aspiration PNA.    Status  Achieved      SLP LONG TERM GOAL #3   Title  Pt will report fewer requests to repeat herself/need for assistance to relay personal information (banking details) on community outings than prior to [place].    Status  Achieved      SLP LONG TERM GOAL #4   Title  Pt will demo awareness of short rushes of speech by attempting correction in 4/5 opportunities x 2 sessions with usual min cues.    Time  1    Period  Weeks    Status  On-going      SLP LONG TERM GOAL #5   Title  Pt will use compensations for dysarthria for >85% intelligibility in 5 minutes simple conversation x 2 visits with usual min cues    Baseline  12-25-18    Time  1    Period  Weeks    Status  On-going       Plan - 12/25/18 1101    Clinical Impression Statement  Ms. 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 She continues to report positive comments on her speech and fewer requests to repeat herself  in public. Pt independently recalled precautions and implemented today 95% success with regular solids, thin liquids; cough x1 noted due to suspected posterior loss discoordination with larger sip. Usual min A for dysarthria compensations/corrections of rushed speech in simple-mod complex conversation today.  Anticipate pt will require some level of cuing from communication partner given severity of deficits and increased difficulty maintaining strategies with cognitive burden.  I recommend skilled ST for training in aspiration precautions, swallow compensations, strategies and compensations for dysarthria in order to improve swallowing safety, intelligibility and quality of life. Anticipate d/c next visit.    Speech Therapy Frequency  2x / week    Duration  --   6 weeks or 13 visits   Treatment/Interventions  Aspiration precaution training;Diet toleration management by SLP;Cognitive reorganization;Compensatory strategies;Patient/family education;SLP instruction and feedback;Functional tasks;Internal/external aids;Cueing hierarchy;Compensatory techniques    Potential to Achieve Goals  Good    Potential Considerations  Severity of impairments       Patient will benefit from skilled therapeutic intervention in order to improve the following deficits and impairments:   Dysarthria and anarthria  Dysphagia, oropharyngeal phase  Cognitive communication deficit    Problem List Patient Active Problem List   Diagnosis Date Noted  . Bradycardia 04/01/2018  . UTI (urinary tract infection) 04/01/2018  . ARF (acute renal failure) ([place]) 04/01/2018  . Right ureteral stone 04/01/2018  . Acute diastolic heart failure ([place]) 04/01/2018  . Acute respiratory failure with hypoxia ([place])   . Pyelonephritis   . Severe sepsis ([place])   . Pyohydronephrosis 03/29/2018  . Septic shock ([place]) 03/29/2018  . Hypokalemia 03/28/2018  . Severe sepsis with septic shock ([place]) 03/28/2018  . Elevated lactic acid level  03/28/2018  . Elevated troponin 03/28/2018  . PSP (progressive supranuclear palsy) ([place]) 02/18/2018  . Chronic cough 11/19/2017  . 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 Cervical stenosis of spinal canal 03/23/2016  . OSA on CPAP 12/14/2015  . Dizziness and giddiness 11/24/2013  . Diabetes type 2, uncontrolled ([place]) 11/24/2013  . Severe obesity (BMI >= 40) ([place]) 11/24/2013  . Lumbar radiculopathy 11/24/2013   Deneise Lever, [place], [place] 12/25/2018, 11:03 AM  [facility] [address] [place], [place], [address] Phone: [phone]   Fax:  [phone]   Name: [name] [name] MRN: [id number] Date of Birth: 1951-09-25

## 2018-12-25 NOTE — Patient Instructions (Signed)
Mealtimes:  Try to avoid distractions and conversations during your meal. It's OK to take a break to have a chat, but when you are eating, try to stay focused.  Remember: Small bites and sips. Slow rate. Take rest breaks if you are short of breath. Finish and clear your bite before you take another bite or a sip of liquid.

## 2018-12-26 DIAGNOSIS — E1140 Type 2 diabetes mellitus with diabetic neuropathy, unspecified: Secondary | ICD-10-CM | POA: Diagnosis not present

## 2018-12-26 DIAGNOSIS — M170 Bilateral primary osteoarthritis of knee: Secondary | ICD-10-CM | POA: Diagnosis not present

## 2018-12-26 DIAGNOSIS — I10 Essential (primary) hypertension: Secondary | ICD-10-CM | POA: Diagnosis not present

## 2018-12-29 ENCOUNTER — Ambulatory Visit: Payer: PPO | Admitting: Speech Pathology

## 2018-12-29 DIAGNOSIS — R41841 Cognitive communication deficit: Secondary | ICD-10-CM

## 2018-12-29 DIAGNOSIS — R471 Dysarthria and anarthria: Secondary | ICD-10-CM

## 2018-12-29 DIAGNOSIS — R1312 Dysphagia, oropharyngeal phase: Secondary | ICD-10-CM

## 2018-12-29 NOTE — Therapy (Signed)
[facility] [address] [place], [place], [address] Phone: [phone]   Fax:  [phone]  Speech Language Pathology Treatment and Discharge Summary Patient Details  Name: [name] [name] MRN: [id number] Date of Birth: Dec 03, 1950 Referring Provider (SLP): Doran Stabler, MD   Encounter Date: 12/29/2018  End of Session - 12/29/18 1023    Visit Number  13    Number of Visits  13    Date for SLP Re-Evaluation  02/16/19    Authorization Type  Medicare/HT Advantage    SLP Start Time  1020    SLP Stop Time   1100    SLP Time Calculation (min)  40 min    Activity Tolerance  Patient tolerated treatment well       Past Medical History:  Diagnosis Date  . Acute kidney failure (HCC)    hx of   . Arthritis    fingers,right shoulder  . Bradycardia   . 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 CHF (congestive heart failure) (HCC)    acute diastolic ( congestive ) heart failure)   . Chronic lumbar radiculopathy    L5- S1  right leg  . Constipation   . Depression   . Diverticulosis large intestine w/o perforation or abscess w/bleeding   . Dizziness   . Dry eye syndrome of bilateral lacrimal glands   . Frequency of urination   . GERD (gastroesophageal reflux disease)   . Hard of hearing   . History of kidney stones 05/12/15   surgery   . HTN (hypertension)   . Hyperlipidemia   . Hypokalemia   . Morbid obesity due to excess calories ([place])   . 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 Numbness and tingling in hands   . OSA (obstructive sleep apnea)    moderate OSA per study 11-22-2007--  refused CPAP but used oxygen for 6 months at night,  states due to wt loss stopped using oxygen  . Polyneuropathy, diabetic ([place])    WALKS W/ CANE FOR BALANCE  . 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 Pyonephrosis    . Pyonephrosis   . Radiculopathy of lumbar region   . Respiratory failure (HCC)    hx of acute respiratory failure wtih hypoxia   . Right ureteral stone   . Severe sepsis with septic shock (CODE) ([place])   . Shortness of breath dyspnea   . Spinal stenosis, cervical region   . SUI (stress urinary incontinence, female)   . Tubulo-interstitial nephritis   . Type 2 diabetes mellitus (HCC)    Type II - Diet controlled  . Urinary incontinence   . Urinary tract infection   . Wears glasses     Past Surgical History:  Procedure Laterality Date  . ANTERIOR CERVICAL DECOMP/DISCECTOMY FUSION N/A 03/23/2016   Procedure: Cervical Four-Five, Cervical Five-Six, Cervical Six-Seven Anterior cervical decompression/diskectomy/fusion;  Surgeon: Leeroy Cha, MD;  Location: [facility];  Service: Neurosurgery;  Laterality: N/A;  C4-5 C5-6 C6-7 Anterior cervical decompression/diskectomy/fusion  . CARDIOVASCULAR STRESS TEST  09-30-2007     normal Adenosine study/  no ischemia /  normal LV function and wall motion , ef 82%  . COLONOSCOPY    . CYSTOSCOPY WITH RETROGRADE PYELOGRAM, URETEROSCOPY AND STENT PLACEMENT Right 05/12/2015   Procedure: CYSTOSCOPY WITH RETROGRADE PYELOGRAM, URETEROSCOPY,STONE EXTRACTION AND  STENT PLACEMENT;  Surgeon: Franchot Gallo, MD;  Location: [facility];  Service: Urology;  Laterality: Right;  . CYSTOSCOPY WITH RETROGRADE PYELOGRAM, URETEROSCOPY AND STENT PLACEMENT Right 04/25/2018   Procedure: CYSTOSCOPY WITH RETROGRADE PYELOGRAM, URETEROSCOPY AND STENT EXCHANGE;  Surgeon: Alexis Frock, MD;  Location: [facility];  Service: Urology;  Laterality: Right;  . CYSTOSCOPY WITH STENT PLACEMENT Right 03/28/2018   Procedure: CYSTOSCOPY WITH STENT PLACEMENT retrograde pylegram;  Surgeon: Alexis Frock, MD;  Location: [facility];  Service: Urology;  Laterality: Right;  . EXTRACORPOREAL SHOCK WAVE LITHOTRIPSY Right 08-18-2012  . HOLMIUM LASER APPLICATION Right 0/93/8182    Procedure: HOLMIUM LASER APPLICATION;  Surgeon: Franchot Gallo, MD;  Location: [facility];  Service: Urology;  Laterality: Right;  . HOLMIUM LASER APPLICATION Right 9/93/7169   Procedure: HOLMIUM LASER APPLICATION;  Surgeon: Alexis Frock, MD;  Location: [facility];  Service: Urology;  Laterality: Right;  . ORIF HUMERUS FRACTURE Right 07/20/2016   Procedure: OPEN REDUCTION INTERNAL FIXATION (ORIF) PROXIMAL HUMERUS FRACTURE VS REVERSE TOTAL SHOULDER ARTHROPLASTY;  Surgeon: Netta Cedars, MD;  Location: [facility];  Service: Orthopedics;  Laterality: Right;  . SHOULDER ARTHROSCOPY Right 07/2016  . TRANSTHORACIC ECHOCARDIOGRAM  09-23-2007   pseudonormal LV filling pattern,  ef 65-70%/  trivial AR/  mild LAE  . TUBAL LIGATION  1985    There were no vitals filed for this visit.  Subjective Assessment - 12/29/18 1022    Subjective  "Feeling pretty good today, so."    Currently in Pain?  No/denies            ADULT SLP TREATMENT - 12/29/18 1020      General Information   Behavior/Cognition  Alert;Cooperative;Pleasant mood      Treatment Provided   Treatment provided  Cognitive-Linquistic;Dysphagia      Dysphagia Treatment   Temperature Spikes Noted  No    Respiratory Status  Room air    Oral Cavity - Dentition  Adequate natural dentition    Treatment Methods  Compensation strategy training;Patient/caregiver education;Skilled observation    Patient observed directly with PO's  Yes    Type of PO's observed  Regular;Thin liquids    Feeding  Able to feed self    Liquids provided via  Cup    Oral Phase Signs & Symptoms  Prolonged mastication   functional   Pharyngeal Phase Signs & Symptoms  --   Intermittent throat clearing at baseline   Type of cueing  Verbal    Amount of cueing  Independent    Other treatment/comments  Pt used compensations independently with thin liquids and regular solids. She cleared her throat intermittently throughout the session, with and without  POs as noted in previous sessions. She reports talking/socializing during breaks in her meal vs throughout meals and feels she is doing much better with her swallowing precautions. SLP reinforced oral care and monitoring for s/sx of aspiration PNA, as well as alerting to MD should she note decline in function.       Pain Assessment   Pain Assessment  No/denies pain      Cognitive-Linquistic Treatment   Treatment focused on  Dysarthria    Skilled Treatment  Intelligibility in simple conversations today (3x 8-10 minutes) was approximately 95% intelligible for this trained listener, with pt correcting short rushes of speech when given min non-verbal signal. While pt does make some self-corrections, min visual cues remain necessary.       Assessment / Recommendations / Plan   Plan  Discharge  SLP treatment due to (comment)   goals partially met     Dysphagia Recommendations   Diet recommendations  Regular;Thin liquid    Liquids provided via  Cup;Straw    Medication Administration  Whole meds with puree    Supervision  Patient able to self feed    Compensations  Slow rate;Small sips/bites   caution with mixed consistencies   Postural Changes and/or Swallow Maneuvers  Seated upright 90 degrees      Progression Toward Goals   Progression toward goals  --   Goals partially met, education completed, pt d/c from [facility] - 12/29/18 1030    Education Details  swallow compensations, aspiration risks, monitor for increased difficulties with swallowing and inform MD if noted    Person(s) Educated  Patient;Other (comment)   friend [name]   Methods  Explanation    Comprehension  Verbalized understanding       SLP Short Term Goals - 12/29/18 1027      SLP SHORT TERM GOAL #1   Title  Pt will demonstrate carryover of swallowing precautions with regular solids, thin liquids x2 sessions with rare min A.     Status  Partially Met      SLP SHORT TERM GOAL #2   Title  Pt will ID short  rushes of speech in 18/20 sentence responses x3 sessions.     Status  Not Met      SLP SHORT TERM GOAL #3   Title  Pt will use compensations for dysarthria in 8 minutes simple conversation x 2 sessions for >90% intelligibility, with occasional min A.    Status  Not Met       SLP Long Term Goals - 12/29/18 1027      SLP LONG TERM GOAL #1   Title  Pt will demo swallow compensations independently with regular solids, thin liquids x2 sessions.     Baseline  12/22/18 12/29/18    Time  1    Period  Weeks    Status  Achieved      SLP LONG TERM GOAL #2   Title  Pt will tell SLP 3 signs of aspiration PNA.    Status  Achieved      SLP LONG TERM GOAL #3   Title  Pt will report fewer requests to repeat herself/need for assistance to relay personal information (banking details) on community outings than prior to [facility].    Status  Achieved      SLP LONG TERM GOAL #4   Title  Pt will demo awareness of short rushes of speech by attempting correction in 4/5 opportunities x 2 sessions with usual min cues.    Time  1    Period  Weeks    Status  Not Met      SLP LONG TERM GOAL #5   Title  Pt will use compensations for dysarthria for >85% intelligibility in 5 minutes simple conversation x 2 visits with usual min cues    Baseline  12-25-18, 12-29-18    Time  1    Period  Weeks    Status  Achieved       Plan - 12/29/18 1029    Clinical Impression Statement  Ms. 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 She continues to report positive comments on her speech and fewer requests to repeat herself in public. Pt independently recalled  precautions and implemented today with regular solids, thin liquids. Usual min A for dysarthria compensations/corrections of rushed speech in simple-mod complex conversation today.  Anticipate pt will require some level of cuing from communication partner  given severity of deficits and increased difficulty maintaining strategies with cognitive burden. At this time pt has met 4/5 LTGs and is in agreement with d/c at this time. Pt does report she is interested in pursuing OT/PT referrals; will inform MD.    Speech Therapy Frequency  --   d/c   Duration  --   d/c   Treatment/Interventions  Aspiration precaution training;Diet toleration management by SLP;Cognitive reorganization;Compensatory strategies;Patient/family education;SLP instruction and feedback;Functional tasks;Internal/external aids;Cueing hierarchy;Compensatory techniques    Potential Considerations  Severity of impairments    SLP Home Exercise Plan  swallow compensations, aspiration PNA education provided    Consulted and Agree with Plan of Care  Patient       Patient will benefit from skilled therapeutic intervention in order to improve the following deficits and impairments:   Dysarthria and anarthria  Dysphagia, oropharyngeal phase  Cognitive communication deficit    Problem List Patient Active Problem List   Diagnosis Date Noted  . Bradycardia 04/01/2018  . UTI (urinary tract infection) 04/01/2018  . ARF (acute renal failure) ([place]) 04/01/2018  . Right ureteral stone 04/01/2018  . Acute diastolic heart failure ([place]) 04/01/2018  . Acute respiratory failure with hypoxia ([place])   . Pyelonephritis   . Severe sepsis ([place])   . Pyohydronephrosis 03/29/2018  . Septic shock ([place]) 03/29/2018  . Hypokalemia 03/28/2018  . Severe sepsis with septic shock ([place]) 03/28/2018  . Elevated lactic acid level 03/28/2018  . Elevated troponin 03/28/2018  . PSP (progressive supranuclear palsy) ([place]) 02/18/2018  . Chronic cough 11/19/2017  . 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 Cervical stenosis of spinal canal 03/23/2016  . OSA on CPAP 12/14/2015  . Dizziness and giddiness 11/24/2013  . Diabetes type 2, uncontrolled ([place]) 11/24/2013  . Severe obesity (BMI >= 40) ([place]) 11/24/2013  .  Lumbar radiculopathy 11/24/2013   SPEECH THERAPY DISCHARGE SUMMARY  Visits from Start of Care: 13  Current functional level related to goals / functional outcomes: Pt utilizing compensations for dysarthria in simple conversation (8-10 minutes) with min visual cues to correct rapid or unintelligible speech. 95% intelligibility today. Demos swallow precautions independently with regular diet/thin liquids.   Remaining deficits: Mild sensory-based dysphagia (per MBS) and moderate-severe mixed dysarthria, mild cognitive deficits    Education / Equipment: Swallow compensations, aspiration risks, inform MD if any decline in swallow function Plan: Patient agrees to discharge.  Patient goals were not met. Patient is being discharged due to meeting the stated rehab goals.  ?????         Deneise Lever, [place], CCC-SLP Speech-Language Pathologist  Aliene Altes 12/29/2018, 2:26 PM  [facility] [address] [place], [place], [address] Phone: [phone]   Fax:  [phone]   Name: JOSSETTE ZIRBEL MRN: [id number] Date of Birth: 1951/05/08

## 2019-01-01 ENCOUNTER — Ambulatory Visit: Payer: PPO | Admitting: Adult Health

## 2019-01-09 DIAGNOSIS — N390 Urinary tract infection, site not specified: Secondary | ICD-10-CM | POA: Diagnosis not present

## 2019-01-09 DIAGNOSIS — Z6835 Body mass index (BMI) 35.0-35.9, adult: Secondary | ICD-10-CM | POA: Diagnosis not present

## 2019-01-27 NOTE — Progress Notes (Unsigned)
[facility] Consult Note Telephone: [phone]  Fax: [phone]  PATIENT NAME: [name] [name] DOB: 10/30/1951 MRN: [id number]  PRIMARY CARE PROVIDER:   [name], MD  REFERRING PROVIDER:  [name], MD [address] [place], [place] [address]  RESPONSIBLE PARTY:     ASSESSMENT:        RECOMMENDATIONS and PLAN:  1.  I spent *** minutes providing this consultation,  from *** to ***. More than 50% of the time in this consultation was spent coordinating communication.   HISTORY OF PRESENT ILLNESS:  [name] [name] is a 68 y.o. year old female with multiple medical problems including ***. Palliative Care was asked to help address goals of care.   CODE STATUS:   PPS: 0% HOSPICE ELIGIBILITY/DIAGNOSIS: TBD  PAST MEDICAL HISTORY:  Past Medical History:  Diagnosis Date  . Acute kidney failure (HCC)    hx of   . Arthritis    fingers,right shoulder  . Bradycardia   . 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 CHF (congestive heart failure) (HCC)    acute diastolic ( congestive ) heart failure)   . Chronic lumbar radiculopathy    L5- S1  right leg  . Constipation   . Depression   . Diverticulosis large intestine w/o perforation or abscess w/bleeding   . Dizziness   . Dry eye syndrome of bilateral lacrimal glands   . Frequency of urination   . GERD (gastroesophageal reflux disease)   . Hard of hearing   . History of kidney stones 05/12/15   surgery   . HTN (hypertension)   . Hyperlipidemia   . Hypokalemia   . Morbid obesity due to excess calories ([place])   . 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 Numbness and tingling in hands   . OSA (obstructive sleep apnea)    moderate OSA per study 11-22-2007--  refused CPAP but used oxygen for 6 months at night,  states due to wt loss stopped using oxygen  . Polyneuropathy, diabetic ([place])    WALKS W/ CANE FOR BALANCE  . 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 Pyonephrosis   . Pyonephrosis   . Radiculopathy of lumbar region   . Respiratory failure (HCC)    hx of acute respiratory failure wtih hypoxia   . Right ureteral stone   . Severe sepsis with septic shock (CODE) ([place])   . Shortness of breath dyspnea   . Spinal stenosis, cervical region   . SUI (stress urinary incontinence, female)   . Tubulo-interstitial nephritis   . Type 2 diabetes mellitus (HCC)    Type II - Diet controlled  . Urinary incontinence   . Urinary tract infection   . Wears glasses     SOCIAL HX:  Social History   Tobacco Use  . Smoking status: Never Smoker  . Smokeless tobacco: Never Used  Substance Use Topics  . Alcohol use: Yes    Alcohol/week: 0.0 standard drinks    Comment: 3- 4 times a year    ALLERGIES:  Allergies  Allergen Reactions  . Aleve [Naproxen] Hives, Shortness Of Breath and Rash    Pt Avoids All Nsaids  . Protonix [Pantoprazole Sodium]     Makes her feel wreidd  . Zocor [Simvastatin] Other (See Comments)    "  weird feeling all over body"     PERTINENT MEDICATIONS:  Outpatient Encounter Medications as of 12/18/2018  Medication Sig  . acetaminophen (TYLENOL) 325 MG tablet Take 650 mg by mouth 2 (two) times daily.   Marland Kitchen aspirin 81 MG chewable tablet Chew 81 mg by mouth daily.  Marland Kitchen azelastine (ASTELIN) 0.1 % nasal spray Place 1 spray into both nostrils 2 (two) times daily.   . benzonatate (TESSALON) 200 MG capsule TAKE 1 CAPSULE (200 MG) BY MOUTH 3 TIMES PER DAY WHEN NEEDED FOR COUGH  . carbidopa-levodopa (SINEMET IR) 25-100 MG tablet 2 tablets TID  . Cholecalciferol (VITAMIN D PO) Take 5,000 Units by mouth daily.  . cyanocobalamin 500 MCG tablet Take 500 mcg by mouth daily.  Marland Kitchen escitalopram (LEXAPRO) 10 MG tablet Take 10 mg by mouth at bedtime.   . fenofibrate 160 MG tablet Take 160 mg by mouth every morning.   . fluticasone  (FLONASE) 50 MCG/ACT nasal spray Place 2 sprays into both nostrils daily.   Marland Kitchen loratadine (CLARITIN) 10 MG tablet Take 10 mg by mouth. Taking every other day.  . Melatonin 5 MG CAPS Take 10 mg by mouth at bedtime as needed.   . Menthol, Topical Analgesic, (BIOFREEZE EX) Apply topically.  . montelukast (SINGULAIR) 10 MG tablet Take 10 mg by mouth at bedtime.  Marland Kitchen nystatin cream (MYCOSTATIN) Apply 1 application topically daily.  Marland Kitchen olmesartan (BENICAR) 40 MG tablet Take 40 mg by mouth daily.  . traMADol (ULTRAM) 50 MG tablet Take 1 tablet (50 mg total) by mouth every 6 (six) hours as needed for moderate pain or severe pain. Post-operatively   No facility-administered encounter medications on file as of 12/18/2018.     PHYSICAL EXAM:   General: NAD, frail appearing, thin Cardiovascular: regular rate and rhythm Pulmonary: clear ant fields Abdomen: soft, nontender, + bowel sounds GU: no suprapubic tenderness Extremities: no edema, no joint deformities Skin: no rashes Neurological: Weakness but otherwise nonfocal  Gonzella Lex, NP

## 2019-01-29 DIAGNOSIS — I10 Essential (primary) hypertension: Secondary | ICD-10-CM | POA: Diagnosis not present

## 2019-01-29 DIAGNOSIS — F320 Major depressive disorder, single episode, mild: Secondary | ICD-10-CM | POA: Diagnosis not present

## 2019-01-29 DIAGNOSIS — E1140 Type 2 diabetes mellitus with diabetic neuropathy, unspecified: Secondary | ICD-10-CM | POA: Diagnosis not present

## 2019-02-17 DIAGNOSIS — I10 Essential (primary) hypertension: Secondary | ICD-10-CM | POA: Diagnosis not present

## 2019-02-17 DIAGNOSIS — M170 Bilateral primary osteoarthritis of knee: Secondary | ICD-10-CM | POA: Diagnosis not present

## 2019-02-17 DIAGNOSIS — R0602 Shortness of breath: Secondary | ICD-10-CM | POA: Diagnosis not present

## 2019-02-17 DIAGNOSIS — J309 Allergic rhinitis, unspecified: Secondary | ICD-10-CM | POA: Diagnosis not present

## 2019-02-23 ENCOUNTER — Telehealth: Payer: Self-pay

## 2019-02-23 NOTE — Telephone Encounter (Signed)
Due to current COVID 19 pandemic, our office is severely reducing in office visits for at least the next 2 weeks, in order to minimize the risk to our patients and healthcare providers.   Jinny Blossom, NP is also out on 03/11/19. I called pt to discussing converting her visit to a virtual visit with Dr. [name]. No answer, left a message asking her to call me back. If pt calls back, please discuss this with her and obtain consent.

## 2019-02-26 ENCOUNTER — Other Ambulatory Visit: Payer: PPO | Admitting: Internal Medicine

## 2019-02-26 ENCOUNTER — Ambulatory Visit (INDEPENDENT_AMBULATORY_CARE_PROVIDER_SITE_OTHER): Payer: PPO | Admitting: Family Medicine

## 2019-02-26 ENCOUNTER — Other Ambulatory Visit: Payer: Self-pay

## 2019-02-26 ENCOUNTER — Encounter: Payer: Self-pay | Admitting: Family Medicine

## 2019-02-26 ENCOUNTER — Telehealth: Payer: Self-pay | Admitting: Internal Medicine

## 2019-02-26 DIAGNOSIS — G4733 Obstructive sleep apnea (adult) (pediatric): Secondary | ICD-10-CM

## 2019-02-26 DIAGNOSIS — Z9989 Dependence on other enabling machines and devices: Secondary | ICD-10-CM | POA: Diagnosis not present

## 2019-02-26 DIAGNOSIS — Z515 Encounter for palliative care: Secondary | ICD-10-CM

## 2019-02-26 NOTE — Telephone Encounter (Signed)
Phoned patient to discuss previously scheduled Palliative Care follow-up appointment that was scheduled for today.  Due to the COVID-19 crisis, pt agreed to complete the visit via telephone until community clearance where appointments can be made in person. Telephone visit was scheduled for 1120 on 02/26/19.                                                                       Gonzella Lex, NP

## 2019-02-26 NOTE — Progress Notes (Addendum)
PATIENT: [name] [name] [name] DOB: 1950-12-24  REASON FOR VISIT: follow up HISTORY FROM: patient  Virtual Visit via Telephone Note  I connected with [name] [name] [name] on 02/26/19 at  1:30 PM EDT by telephone and verified that I am speaking with the correct person using two identifiers.   I discussed the limitations, risks, security and privacy concerns of performing an evaluation and management service by telephone and the availability of in person appointments. I also discussed with the patient that there may be [name] [name] patient responsible charge related to this service. The patient expressed understanding and agreed to proceed.   History of Present Illness:  02/26/19 [name] [name] [name] is [name] [name] 68 y.o. female for follow up of OSA on CPAP.  During telephone conference patient is having difficulty hearing as well as suffers from dysarthria being treated by speech therapy.  Her friend, [name] [name] [name], assistance in obtaining information today.  Patient has consented.  She reports that over the last 2 months she has been unable to use her CPAP much at all due to some sinus trouble.  Recently she was seen by her PCP and was started on sinus medication.  She feels that this is been helping.  She plans to continue use of CPAP.  She has no other concerns today.  History (copied from [name] [name] [name] note on 01/01/2018)  [name] [name] [name] is [name] [name] 68 year old female with [name] [name] history of obstructive sleep apnea on CPAP she returns today for follow-up.  Her download indicates that she used her machine 29 out of 30 days for compliance of 97%.  She did not use her machine greater than 4 hours any night.  On average she uses her machine 1 hour and 29 minutes. her residual AHI is 6.4 on 7 cm of water with EPR 3.  Her leak in the 95th percentile is 23.6 L/min.  The patient states that she does try to use the machine whenever she is sleeping.  She reports that there are some nights that she only gets between 2 and 5 hours of sleep.  She denies  any [name] symptoms.  She denies any significant daytime sleepiness.  She does follow with Dr. Carles Collet for possible PSP.  She returns today for evaluation.  HISTORY 12/26/2016:She reportsDoing fairly well, overall, she has had [name] [name] good transition to her [name] facility, she has been there for about [name] [name] week. She's not exercising very much, she is practically wheelchair bound. She had [name] [name] fall about 2 weeks ago. She has not been driving. 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 She has [name] [name] son in [place]. She does not always keep the CPAP on. She starts off using it but in the middle of the night or early morning hours she has to get up to use the bathroom, which is usually 1to3 times per average night. Then she goes and sits in her lift chair and sleeps there without the CPAP on.In the interim, she had right shoulder replacement on 07/20/2016 under Dr. Veverly Fells, after she had sustained [name] [name] displaced right humerus fracture. She had neck surgery on 03/23/2016 under Dr. [name]. She had anterior cervical C4-5, C5-6 and C6-7 discectomy, decompression of spinal cord, foraminotomy, interbody fusion with graft and plate. She was recently seen by Dr. Carles Collet on 12/10/2016 for her parkinsonism, concern for MSA and I reviewed the note. I reviewed her CPAP compliance data from 11/25/2016 through 12/24/2016, which is [name] [name] total of 30 days, during which time she used her CPAP  29 days with percent used days greater than 4 hours at 30% only, indicatingsuboptimalcompliance with an average usage of only 3 hours and 28 minutes, residual AHI 4.2 per hour, leak on the high side with the 95th percentile at 30.7 L/m on [name] [name] pressure of 7 cm with EPR of 3. Saw Dr. Veverly Fells yesterday, released from the right shoulder aspect. Saw Dr. Maryjean Ka for neck pain recently and had neck injection in Jan., but did not help very much.   Observations/Objective:  Generalized: Well developed, in no acute distress  Mentation: Alert oriented to  time, place, history taking. Follows all commands speech and language fluent   Assessment and Plan:  68 y.o. year old female  has [name] [name] past medical history of Acute kidney failure ([name] [name] [name]), Arthritis, Bradycardia, Bulge of cervical disc without myelopathy, CHF (congestive heart failure) ([name] [name] [name]), Chronic lumbar radiculopathy, Constipation, Depression, Diverticulosis large intestine w/o perforation or abscess w/bleeding, Dizziness, Dry eye syndrome of bilateral lacrimal glands, Frequency of urination, GERD (gastroesophageal reflux disease), Hard of hearing, History of kidney stones (05/12/15), HTN (hypertension), Hyperlipidemia, Hypokalemia, Morbid obesity due to excess calories ([name] [name]), 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[name] [name] [name]), 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[name] [name] [name]), Numbness and tingling in hands, OSA (obstructive sleep apnea), Polyneuropathy, diabetic ([name] [name] [name]), Progressive supranuclear ophthalmoplegia ([name] [name] [name]), Progressive supranuclear palsy ([name] [name]), Pyonephrosis, Pyonephrosis, Radiculopathy of lumbar region, Respiratory failure ([name] [name] [name]), Right ureteral stone, Severe sepsis with septic shock ([name] [name]) ([name] [name] [name]), Shortness of breath dyspnea, Spinal stenosis, cervical region, SUI (stress urinary incontinence, female), Tubulo-interstitial nephritis, Type 2 diabetes mellitus ([name] [name]), Urinary incontinence, Urinary tract infection, and Wears glasses. here with    ICD-10-CM   1. OSA on CPAP G47.33    Z99.89    [name] [name] [name] has been unable to use her CPAP for the previous 2 months.  Reviewing prior notes I am concerned about ongoing compliance concerns.  I have educated [name] [name] [name] on the need for regular use of CPAP.  I have advised nightly use and for greater than 4 hours each night. I have asked that she come into the office in 3 months for reevaluation.  At this time we will hopefully be able to speak face-to-face for further education and another review of her download report.  I have also advised that should sinus concerns not resolve in the next week  that she should reach out to her primary care provider.  She verbalizes understanding and agreement with this plan.  No orders of the defined types were placed in this encounter.   No orders of the defined types were placed in this encounter.  Follow Up Instructions:  I discussed the assessment and treatment plan with the patient. The patient was provided an opportunity to ask questions and all were answered. The patient agreed with the plan and demonstrated an understanding of the instructions.   The patient was advised to call back or seek an in-person evaluation if the symptoms worsen or if the condition fails to improve as anticipated.  I provided 25 minutes of non-face-to-face time during this encounter. Patient is located at her place of residence during teleconference.  [name] [name] [name], patient's friend, assist with translation over the phone due to hearing difficulty.  Provider located at her place of residence.  [name] Comber, [name] [name] [name] help to facilitate visit.   [name] Presto, [name] [name] [name]   I reviewed the above note and documentation by the Nurse Practitioner and agree with the history, assessment and plan as outlined above. I was immediately available for [name] [name] phone consultation. [name] Age, MD, PhD [facility]

## 2019-02-26 NOTE — Telephone Encounter (Signed)
Pt called in and gave consent for video visit, she states she doesn't have access to video visit. Difficult to understand patient for other information

## 2019-02-26 NOTE — Telephone Encounter (Signed)
I called [name] [name]. She does not have video visit capabilities. She is agreeable to a telephone visit with Amy, NP today at 1:30pm.  [name] [name] understands that although there may be some limitations with this type of visit, we will take all precautions to reduce any security or privacy concerns.  [name] [name] understands that this will be treated like an in office visit and we will file with [name] [name]'s insurance, and there may be a patient responsible charge related to this service.  [name] [name]'s meds, allergies, and PMH were updated.  [name] [name] reports that her cpap is going well. She did not use her cpap when she had a cold in February.  Of note, [name] [name] does have dysarthria and it is difficult to understand her at times.  [name] [name] can be reached around 1:30pm at [phone].

## 2019-03-02 DIAGNOSIS — L8990 Pressure ulcer of unspecified site, unspecified stage: Secondary | ICD-10-CM | POA: Diagnosis not present

## 2019-03-03 DIAGNOSIS — M170 Bilateral primary osteoarthritis of knee: Secondary | ICD-10-CM | POA: Diagnosis not present

## 2019-03-03 DIAGNOSIS — W19XXXA Unspecified fall, initial encounter: Secondary | ICD-10-CM | POA: Diagnosis not present

## 2019-03-03 DIAGNOSIS — G1220 Motor neuron disease, unspecified: Secondary | ICD-10-CM | POA: Diagnosis not present

## 2019-03-03 DIAGNOSIS — L8990 Pressure ulcer of unspecified site, unspecified stage: Secondary | ICD-10-CM | POA: Diagnosis not present

## 2019-03-05 ENCOUNTER — Other Ambulatory Visit: Payer: Self-pay

## 2019-03-05 ENCOUNTER — Inpatient Hospital Stay (HOSPITAL_COMMUNITY)
Admission: EM | Admit: 2019-03-05 | Discharge: 2019-03-12 | DRG: 854 | Disposition: A | Discharge status: Home Health | Payer: PPO | Attending: Internal Medicine | Admitting: Internal Medicine

## 2019-03-05 ENCOUNTER — Emergency Department (HOSPITAL_COMMUNITY): Payer: PPO

## 2019-03-05 ENCOUNTER — Encounter (HOSPITAL_COMMUNITY): Payer: Self-pay | Admitting: Emergency Medicine

## 2019-03-05 DIAGNOSIS — A419 Sepsis, unspecified organism: Principal | ICD-10-CM

## 2019-03-05 DIAGNOSIS — L02215 Cutaneous abscess of perineum: Secondary | ICD-10-CM | POA: Diagnosis present

## 2019-03-05 DIAGNOSIS — I5032 Chronic diastolic (congestive) heart failure: Secondary | ICD-10-CM | POA: Diagnosis present

## 2019-03-05 DIAGNOSIS — L03116 Cellulitis of left lower limb: Secondary | ICD-10-CM | POA: Diagnosis not present

## 2019-03-05 DIAGNOSIS — G20 Parkinson's disease: Secondary | ICD-10-CM | POA: Diagnosis not present

## 2019-03-05 DIAGNOSIS — K219 Gastro-esophageal reflux disease without esophagitis: Secondary | ICD-10-CM | POA: Diagnosis not present

## 2019-03-05 DIAGNOSIS — Z9989 Dependence on other enabling machines and devices: Secondary | ICD-10-CM

## 2019-03-05 DIAGNOSIS — Z7982 Long term (current) use of aspirin: Secondary | ICD-10-CM

## 2019-03-05 DIAGNOSIS — L0390 Cellulitis, unspecified: Secondary | ICD-10-CM

## 2019-03-05 DIAGNOSIS — H9190 Unspecified hearing loss, unspecified ear: Secondary | ICD-10-CM | POA: Diagnosis not present

## 2019-03-05 DIAGNOSIS — R0689 Other abnormalities of breathing: Secondary | ICD-10-CM | POA: Diagnosis not present

## 2019-03-05 DIAGNOSIS — G4733 Obstructive sleep apnea (adult) (pediatric): Secondary | ICD-10-CM

## 2019-03-05 DIAGNOSIS — N730 Acute parametritis and pelvic cellulitis: Secondary | ICD-10-CM

## 2019-03-05 DIAGNOSIS — M6281 Muscle weakness (generalized): Secondary | ICD-10-CM | POA: Diagnosis not present

## 2019-03-05 DIAGNOSIS — M5416 Radiculopathy, lumbar region: Secondary | ICD-10-CM | POA: Diagnosis present

## 2019-03-05 DIAGNOSIS — B372 Candidiasis of skin and nail: Secondary | ICD-10-CM | POA: Diagnosis present

## 2019-03-05 DIAGNOSIS — E1165 Type 2 diabetes mellitus with hyperglycemia: Secondary | ICD-10-CM

## 2019-03-05 DIAGNOSIS — E119 Type 2 diabetes mellitus without complications: Secondary | ICD-10-CM | POA: Diagnosis not present

## 2019-03-05 DIAGNOSIS — L89309 Pressure ulcer of unspecified buttock, unspecified stage: Secondary | ICD-10-CM | POA: Diagnosis not present

## 2019-03-05 DIAGNOSIS — E1142 Type 2 diabetes mellitus with diabetic polyneuropathy: Secondary | ICD-10-CM | POA: Diagnosis not present

## 2019-03-05 DIAGNOSIS — E785 Hyperlipidemia, unspecified: Secondary | ICD-10-CM | POA: Diagnosis not present

## 2019-03-05 DIAGNOSIS — Z79899 Other long term (current) drug therapy: Secondary | ICD-10-CM

## 2019-03-05 DIAGNOSIS — L03315 Cellulitis of perineum: Secondary | ICD-10-CM | POA: Diagnosis not present

## 2019-03-05 DIAGNOSIS — M4726 Other spondylosis with radiculopathy, lumbar region: Secondary | ICD-10-CM | POA: Diagnosis present

## 2019-03-05 DIAGNOSIS — R0902 Hypoxemia: Secondary | ICD-10-CM | POA: Diagnosis not present

## 2019-03-05 DIAGNOSIS — I110 Hypertensive heart disease with heart failure: Secondary | ICD-10-CM | POA: Diagnosis not present

## 2019-03-05 DIAGNOSIS — F329 Major depressive disorder, single episode, unspecified: Secondary | ICD-10-CM | POA: Diagnosis not present

## 2019-03-05 DIAGNOSIS — R52 Pain, unspecified: Secondary | ICD-10-CM | POA: Diagnosis not present

## 2019-03-05 DIAGNOSIS — L03317 Cellulitis of buttock: Secondary | ICD-10-CM | POA: Diagnosis not present

## 2019-03-05 DIAGNOSIS — M7989 Other specified soft tissue disorders: Secondary | ICD-10-CM | POA: Diagnosis not present

## 2019-03-05 DIAGNOSIS — L8990 Pressure ulcer of unspecified site, unspecified stage: Secondary | ICD-10-CM

## 2019-03-05 DIAGNOSIS — IMO0002 Reserved for concepts with insufficient information to code with codable children: Secondary | ICD-10-CM | POA: Diagnosis present

## 2019-03-05 DIAGNOSIS — Z209 Contact with and (suspected) exposure to unspecified communicable disease: Secondary | ICD-10-CM | POA: Diagnosis not present

## 2019-03-05 DIAGNOSIS — R000 Tachycardia, unspecified: Secondary | ICD-10-CM | POA: Diagnosis not present

## 2019-03-05 DIAGNOSIS — E872 Acidosis: Secondary | ICD-10-CM | POA: Diagnosis not present

## 2019-03-05 DIAGNOSIS — L03314 Cellulitis of groin: Secondary | ICD-10-CM | POA: Diagnosis not present

## 2019-03-05 DIAGNOSIS — R509 Fever, unspecified: Secondary | ICD-10-CM | POA: Diagnosis not present

## 2019-03-05 DIAGNOSIS — Z7951 Long term (current) use of inhaled steroids: Secondary | ICD-10-CM

## 2019-03-05 DIAGNOSIS — L0291 Cutaneous abscess, unspecified: Secondary | ICD-10-CM | POA: Diagnosis not present

## 2019-03-05 HISTORY — DX: Unspecified open wound of unspecified buttock, initial encounter: S31.809A

## 2019-03-05 LAB — CBC WITH DIFFERENTIAL/PLATELET
Abs Immature Granulocytes: 0.15 10*3/uL — ABNORMAL HIGH (ref 0.00–0.07)
Basophils Absolute: 0 10*3/uL (ref 0.0–0.1)
Basophils Relative: 0 %
Eosinophils Absolute: 0 10*3/uL (ref 0.0–0.5)
Eosinophils Relative: 0 %
HCT: 35.7 % — ABNORMAL LOW (ref 36.0–46.0)
Hemoglobin: 11.5 g/dL — ABNORMAL LOW (ref 12.0–15.0)
Immature Granulocytes: 1 %
Lymphocytes Relative: 8 %
Lymphs Abs: 1 10*3/uL (ref 0.7–4.0)
MCH: 29.8 pg (ref 26.0–34.0)
MCHC: 32.2 g/dL (ref 30.0–36.0)
MCV: 92.5 fL (ref 80.0–100.0)
Monocytes Absolute: 0.7 10*3/uL (ref 0.1–1.0)
Monocytes Relative: 5 %
Neutro Abs: 11.3 10*3/uL — ABNORMAL HIGH (ref 1.7–7.7)
Neutrophils Relative %: 86 %
Platelets: 238 10*3/uL (ref 150–400)
RBC: 3.86 MIL/uL — ABNORMAL LOW (ref 3.87–5.11)
RDW: 12.7 % (ref 11.5–15.5)
WBC: 13.2 10*3/uL — ABNORMAL HIGH (ref 4.0–10.5)
nRBC: 0 % (ref 0.0–0.2)

## 2019-03-05 LAB — URINALYSIS, ROUTINE W REFLEX MICROSCOPIC
Bilirubin Urine: NEGATIVE
Glucose, UA: NEGATIVE mg/dL
Ketones, ur: NEGATIVE mg/dL
Nitrite: NEGATIVE
Protein, ur: 30 mg/dL — AB
Specific Gravity, Urine: 1.015 (ref 1.005–1.030)
pH: 6 (ref 5.0–8.0)

## 2019-03-05 LAB — COMPREHENSIVE METABOLIC PANEL
ALT: 18 U/L (ref 0–44)
AST: 19 U/L (ref 15–41)
Albumin: 3.4 g/dL — ABNORMAL LOW (ref 3.5–5.0)
Alkaline Phosphatase: 75 U/L (ref 38–126)
Anion gap: 12 (ref 5–15)
BUN: 16 mg/dL (ref 8–23)
CO2: 22 mmol/L (ref 22–32)
Calcium: 9.7 mg/dL (ref 8.9–10.3)
Chloride: 102 mmol/L (ref 98–111)
Creatinine, Ser: 0.76 mg/dL (ref 0.44–1.00)
GFR calc Af Amer: >60 mL/min (ref >60)
GFR calc non Af Amer: >60 mL/min (ref >60)
Glucose, Bld: 200 mg/dL — ABNORMAL HIGH (ref 70–99)
Potassium: 3.7 mmol/L (ref 3.5–5.1)
Sodium: 136 mmol/L (ref 135–145)
Total Bilirubin: 0.6 mg/dL (ref 0.3–1.2)
Total Protein: 6.7 g/dL (ref 6.5–8.1)

## 2019-03-05 LAB — GLUCOSE, CAPILLARY: Glucose-Capillary: 108 mg/dL — ABNORMAL HIGH (ref 70–99)

## 2019-03-05 LAB — LACTIC ACID, PLASMA: Lactic Acid, Venous: 2.4 mmol/L (ref 0.5–1.9)

## 2019-03-05 MED ORDER — HYDRALAZINE HCL 20 MG/ML IJ SOLN: 10.0000 mg | INTRAMUSCULAR | Status: DC | PRN

## 2019-03-05 MED ORDER — POLYVINYL ALCOHOL 1.4 % OP SOLN
1.0000 [drp] | OPHTHALMIC | Status: DC | PRN
  Administered 2019-03-08 – 2019-03-10 (×2): 1 [drp] via OPHTHALMIC
  Filled 2019-03-05: qty 15

## 2019-03-05 MED ORDER — SENNOSIDES-DOCUSATE SODIUM 8.6-50 MG PO TABS: 1.0000 | ORAL_TABLET | Freq: Every evening | ORAL | Status: DC | PRN

## 2019-03-05 MED ORDER — ASPIRIN 81 MG PO CHEW
81.0000 mg | CHEWABLE_TABLET | Freq: Every day | ORAL | Status: DC
  Administered 2019-03-06 – 2019-03-12 (×7): 81 mg via ORAL
  Filled 2019-03-05 (×7): qty 1

## 2019-03-05 MED ORDER — VITAMIN D 25 MCG (1000 UNIT) PO TABS
5000.0000 [IU] | ORAL_TABLET | Freq: Every day | ORAL | Status: DC
  Administered 2019-03-06 – 2019-03-12 (×7): 5000 [IU] via ORAL
  Filled 2019-03-05 (×7): qty 5

## 2019-03-05 MED ORDER — SODIUM CHLORIDE 0.9 % IV SOLN
INTRAVENOUS | Status: DC
  Administered 2019-03-05 – 2019-03-06 (×2): via INTRAVENOUS

## 2019-03-05 MED ORDER — ESCITALOPRAM OXALATE 10 MG PO TABS
15.0000 mg | ORAL_TABLET | Freq: Every day | ORAL | Status: DC
  Administered 2019-03-06 – 2019-03-12 (×7): 15 mg via ORAL
  Filled 2019-03-05 (×7): qty 2

## 2019-03-05 MED ORDER — CARBIDOPA-LEVODOPA 25-100 MG PO TABS
2.0000 | ORAL_TABLET | Freq: Three times a day (TID) | ORAL | Status: DC
  Administered 2019-03-05 – 2019-03-12 (×20): 2 via ORAL
  Filled 2019-03-05 (×20): qty 2

## 2019-03-05 MED ORDER — HYDROCORTISONE (PERIANAL) 2.5 % EX CREA
1.0000 "application " | TOPICAL_CREAM | Freq: Four times a day (QID) | CUTANEOUS | Status: DC | PRN
  Filled 2019-03-05: qty 28.35

## 2019-03-05 MED ORDER — MUSCLE RUB 10-15 % EX CREA
1.0000 "application " | TOPICAL_CREAM | CUTANEOUS | Status: DC | PRN
  Administered 2019-03-07 – 2019-03-11 (×3): 1 via TOPICAL
  Filled 2019-03-05: qty 85

## 2019-03-05 MED ORDER — ALUM & MAG HYDROXIDE-SIMETH 200-200-20 MG/5ML PO SUSP: 30.0000 mL | ORAL | Status: DC | PRN

## 2019-03-05 MED ORDER — POLYETHYLENE GLYCOL 3350 17 G PO PACK: 17.0000 g | PACK | Freq: Every day | ORAL | Status: DC | PRN

## 2019-03-05 MED ORDER — VANCOMYCIN HCL 10 G IV SOLR
1500.0000 mg | Freq: Once | INTRAVENOUS | Status: AC
  Administered 2019-03-05: 1500 mg via INTRAVENOUS
  Filled 2019-03-05: qty 1500

## 2019-03-05 MED ORDER — ACETAMINOPHEN 650 MG RE SUPP
650.0000 mg | Freq: Four times a day (QID) | RECTAL | Status: DC | PRN
  Administered 2019-03-06: 04:00:00 650 mg via RECTAL
  Filled 2019-03-05: qty 1

## 2019-03-05 MED ORDER — ONDANSETRON HCL 4 MG PO TABS: 4.0000 mg | ORAL_TABLET | Freq: Four times a day (QID) | ORAL | Status: DC | PRN

## 2019-03-05 MED ORDER — ONDANSETRON HCL 4 MG/2ML IJ SOLN: 4.0000 mg | Freq: Four times a day (QID) | INTRAMUSCULAR | Status: DC | PRN

## 2019-03-05 MED ORDER — HYDROCORTISONE 1 % EX CREA
1.0000 "application " | TOPICAL_CREAM | Freq: Three times a day (TID) | CUTANEOUS | Status: DC | PRN
  Filled 2019-03-05: qty 28

## 2019-03-05 MED ORDER — PHENOL 1.4 % MT LIQD: 1.0000 | OROMUCOSAL | Status: DC | PRN

## 2019-03-05 MED ORDER — IOHEXOL 300 MG/ML  SOLN
100.0000 mL | Freq: Once | INTRAMUSCULAR | Status: AC | PRN
  Administered 2019-03-05: 100 mL via INTRAVENOUS

## 2019-03-05 MED ORDER — VANCOMYCIN HCL IN DEXTROSE 1-5 GM/200ML-% IV SOLN
1000.0000 mg | INTRAVENOUS | Status: AC
  Administered 2019-03-06 – 2019-03-11 (×6): 1000 mg via INTRAVENOUS
  Filled 2019-03-05 (×7): qty 200

## 2019-03-05 MED ORDER — SODIUM CHLORIDE 0.9 % IV SOLN
2.0000 g | Freq: Once | INTRAVENOUS | Status: AC
  Administered 2019-03-05: 2 g via INTRAVENOUS
  Filled 2019-03-05: qty 20

## 2019-03-05 MED ORDER — FENOFIBRATE 160 MG PO TABS
160.0000 mg | ORAL_TABLET | Freq: Every morning | ORAL | Status: DC
  Administered 2019-03-06 – 2019-03-12 (×7): 160 mg via ORAL
  Filled 2019-03-05 (×7): qty 1

## 2019-03-05 MED ORDER — SODIUM CHLORIDE 0.9 % IV BOLUS (SEPSIS)
1000.0000 mL | Freq: Once | INTRAVENOUS | Status: AC
  Administered 2019-03-05: 1000 mL via INTRAVENOUS

## 2019-03-05 MED ORDER — TRAMADOL HCL 50 MG PO TABS
50.0000 mg | ORAL_TABLET | Freq: Four times a day (QID) | ORAL | Status: DC | PRN
  Administered 2019-03-07 – 2019-03-08 (×3): 50 mg via ORAL
  Filled 2019-03-05 (×3): qty 1

## 2019-03-05 MED ORDER — LIP MEDEX EX OINT
1.0000 "application " | TOPICAL_OINTMENT | CUTANEOUS | Status: DC | PRN
  Administered 2019-03-05 – 2019-03-06 (×2): 1 via TOPICAL
  Filled 2019-03-05: qty 7

## 2019-03-05 MED ORDER — INSULIN ASPART 100 UNIT/ML ~~LOC~~ SOLN: 0.0000 [IU] | Freq: Every day | SUBCUTANEOUS | Status: DC

## 2019-03-05 MED ORDER — SALINE SPRAY 0.65 % NA SOLN
1.0000 | NASAL | Status: DC | PRN
  Filled 2019-03-05: qty 44

## 2019-03-05 MED ORDER — VANCOMYCIN HCL IN DEXTROSE 1-5 GM/200ML-% IV SOLN: 1000.0000 mg | Freq: Once | INTRAVENOUS | Status: DC

## 2019-03-05 MED ORDER — BISACODYL 5 MG PO TBEC
5.0000 mg | DELAYED_RELEASE_TABLET | Freq: Every day | ORAL | Status: DC | PRN
  Administered 2019-03-06: 5 mg via ORAL
  Filled 2019-03-05: qty 1

## 2019-03-05 MED ORDER — LORATADINE 10 MG PO TABS: 10.0000 mg | ORAL_TABLET | Freq: Every day | ORAL | Status: DC | PRN

## 2019-03-05 MED ORDER — ACETAMINOPHEN 325 MG PO TABS
650.0000 mg | ORAL_TABLET | Freq: Four times a day (QID) | ORAL | Status: DC | PRN
  Administered 2019-03-05 – 2019-03-07 (×3): 650 mg via ORAL
  Filled 2019-03-05 (×3): qty 2

## 2019-03-05 MED ORDER — HYDROCODONE-ACETAMINOPHEN 5-325 MG PO TABS
1.0000 | ORAL_TABLET | ORAL | Status: DC | PRN
  Administered 2019-03-09: 22:00:00 2 via ORAL
  Administered 2019-03-11: 01:00:00 1 via ORAL
  Administered 2019-03-12: 2 via ORAL
  Administered 2019-03-12: 1 via ORAL
  Filled 2019-03-05 (×2): qty 2
  Filled 2019-03-05 (×2): qty 1

## 2019-03-05 MED ORDER — INSULIN ASPART 100 UNIT/ML ~~LOC~~ SOLN
0.0000 [IU] | Freq: Three times a day (TID) | SUBCUTANEOUS | Status: DC
  Administered 2019-03-06: 2 [IU] via SUBCUTANEOUS
  Administered 2019-03-08: 13:00:00 3 [IU] via SUBCUTANEOUS
  Administered 2019-03-09 – 2019-03-10 (×2): 2 [IU] via SUBCUTANEOUS
  Administered 2019-03-10: 5 [IU] via SUBCUTANEOUS
  Administered 2019-03-11: 18:00:00 3 [IU] via SUBCUTANEOUS
  Administered 2019-03-11 – 2019-03-12 (×2): 2 [IU] via SUBCUTANEOUS

## 2019-03-05 MED ORDER — HEPARIN SODIUM (PORCINE) 5000 UNIT/ML IJ SOLN
5000.0000 [IU] | Freq: Three times a day (TID) | INTRAMUSCULAR | Status: DC
  Administered 2019-03-05 – 2019-03-12 (×19): 5000 [IU] via SUBCUTANEOUS
  Filled 2019-03-05 (×18): qty 1

## 2019-03-05 MED ORDER — OXYCODONE HCL 5 MG PO TABS
5.0000 mg | ORAL_TABLET | ORAL | Status: DC | PRN
  Administered 2019-03-05 – 2019-03-06 (×2): 5 mg via ORAL
  Filled 2019-03-05 (×2): qty 1

## 2019-03-05 MED ORDER — GUAIFENESIN-DM 100-10 MG/5ML PO SYRP: 5.0000 mL | ORAL_SOLUTION | ORAL | Status: DC | PRN

## 2019-03-05 MED ORDER — SENNOSIDES-DOCUSATE SODIUM 8.6-50 MG PO TABS: 2.0000 | ORAL_TABLET | Freq: Every evening | ORAL | Status: DC | PRN

## 2019-03-05 NOTE — ED Triage Notes (Signed)
Per [facility], Pt from independent living on [address]. Pt reports weakness x 2 days, pt slid from chair to floor today, denies head injury, no LOC. Pt has pressure ulcer to sacrum, is not taking antibiotics. Home health sees her regularly for this. Pt took tylenol at lunch, pt received 1000 mg of tylenol with EMS about 20 minutes ago. EMS found pt to have temp of 103 temporal. Pt denies cp, SHOB, . Pt appears to be short of breath but reports she feels her breathing is okay. EMS found pt's O2 sats to be 87-98%, RR 32. Pt placed on 2 L [place]. She denies sick contact or recent travel. CBG 230.

## 2019-03-05 NOTE — H&P (Signed)
History and Physical    [name] [name]:[id number] DOB: [birth year] DOA: 03/05/2019  PCP: [name], MD Patient coming from: [facility]  Chief Complaint: Weakness  HPI: [name] [name] is a 68 y.o. female with medical history significant of depression, obstructive sleep apnea, diabetes mellitus type 2, essential hypertension, diastolic CHF, hyperlipidemia was brought to the hospital for evaluation of weakness for 2 days and sliding of her chair onto the floor.  She is also had some subjective fevers and chills with concerns for infection in her sacrum area of her pressure ulcer.  Patient states she has been feeling very weak and has remained pretty much bedbound over the last couple of days.  Typically she uses walker to get around.She gets home health who comes in daily.  In the ER patient was noted to have relative leukocytosis, febrile and tachycardia.  CT of the pelvis showed concerning for cellulitis extending into her left gluteus area with slight drainage.  She was started on IV Rocephin and vancomycin.  Medical team requested to admit the patient.  Patient denies any respiratory symptoms including shortness of breath, cough.  Denies any sick contacts including concerns for COVID.   Review of Systems: As per HPI otherwise 10 point review of systems negative.  Review of Systems Otherwise negative except as per HPI, including: General: Denies night sweats or unintended weight loss. Resp: Denies cough, wheezing, shortness of breath. Cardiac: Denies chest pain, palpitations, orthopnea, paroxysmal nocturnal dyspnea. GI: Denies abdominal pain, nausea, vomiting, diarrhea or constipation GU: Denies dysuria, frequency, hesitancy or incontinence MS: Denies muscle aches, joint pain or swelling Neuro: Denies headache, neurologic deficits (focal weakness, numbness, tingling), abnormal gait Psych: Denies anxiety, depression, SI/HI/AVH Skin: Denies new rashes  or lesions ID: Denies sick contacts, exotic exposures, travel  Past Medical History:  Diagnosis Date   Acute kidney failure (HCC)    hx of    Arthritis    fingers,right shoulder   Bradycardia    Bulge of cervical disc without myelopathy    C4 -- C7  and stenosis   CHF (congestive heart failure) (HCC)    acute diastolic ( congestive ) heart failure)    Chronic lumbar radiculopathy    L5- S1  right leg   Constipation    Depression    Diverticulosis large intestine w/o perforation or abscess w/bleeding    Dizziness    Dry eye syndrome of bilateral lacrimal glands    Frequency of urination    GERD (gastroesophageal reflux disease)    Hard of hearing    History of kidney stones 05/12/15   surgery    HTN (hypertension)    Hyperlipidemia    Hypokalemia    Morbid obesity due to excess calories ([place])    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[place])    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HCC)    Numbness and tingling in hands    OSA (obstructive sleep apnea)    moderate OSA per study 11-22-2007--  refused CPAP but used oxygen for 6 months at night,  states due to wt loss stopped using oxygen   Polyneuropathy, diabetic (HCC)    WALKS W/ CANE FOR BALANCE   Progressive supranuclear ophthalmoplegia (HCC)    Progressive supranuclear palsy (HCC)    Pyonephrosis    Pyonephrosis    Radiculopathy of lumbar region    Respiratory failure (HCC)    hx of acute respiratory failure wtih hypoxia    Right ureteral stone    Severe  sepsis with septic shock (CODE) (HCC)    Shortness of breath dyspnea    Spinal stenosis, cervical region    SUI (stress urinary incontinence, female)    Tubulo-interstitial nephritis    Type 2 diabetes mellitus (HCC)    Type II - Diet controlled   Urinary incontinence    Urinary tract infection    Wears glasses     Past Surgical History:  Procedure Laterality Date   ANTERIOR CERVICAL DECOMP/DISCECTOMY FUSION N/A 03/23/2016   Procedure:  Cervical Four-Five, Cervical Five-Six, Cervical Six-Seven Anterior cervical decompression/diskectomy/fusion;  Surgeon: Leeroy Cha, MD;  Location: [facility];  Service: Neurosurgery;  Laterality: N/A;  C4-5 C5-6 C6-7 Anterior cervical decompression/diskectomy/fusion   CARDIOVASCULAR STRESS TEST  09-30-2007     normal Adenosine study/  no ischemia /  normal LV function and wall motion , ef 82%   COLONOSCOPY     CYSTOSCOPY WITH RETROGRADE PYELOGRAM, URETEROSCOPY AND STENT PLACEMENT Right 05/12/2015   Procedure: CYSTOSCOPY WITH RETROGRADE PYELOGRAM, URETEROSCOPY,STONE EXTRACTION AND STENT PLACEMENT;  Surgeon: Franchot Gallo, MD;  Location: [facility];  Service: Urology;  Laterality: Right;   CYSTOSCOPY WITH RETROGRADE PYELOGRAM, URETEROSCOPY AND STENT PLACEMENT Right 04/25/2018   Procedure: CYSTOSCOPY WITH RETROGRADE PYELOGRAM, URETEROSCOPY AND STENT EXCHANGE;  Surgeon: Alexis Frock, MD;  Location: [facility];  Service: Urology;  Laterality: Right;   CYSTOSCOPY WITH STENT PLACEMENT Right 03/28/2018   Procedure: CYSTOSCOPY WITH STENT PLACEMENT retrograde pylegram;  Surgeon: Alexis Frock, MD;  Location: [facility];  Service: Urology;  Laterality: Right;   EXTRACORPOREAL SHOCK WAVE LITHOTRIPSY Right 08-18-2012   HOLMIUM LASER APPLICATION Right 9/47/0962   Procedure: HOLMIUM LASER APPLICATION;  Surgeon: Franchot Gallo, MD;  Location: [facility];  Service: Urology;  Laterality: Right;   HOLMIUM LASER APPLICATION Right 8/36/6294   Procedure: HOLMIUM LASER APPLICATION;  Surgeon: Alexis Frock, MD;  Location: [facility];  Service: Urology;  Laterality: Right;   ORIF HUMERUS FRACTURE Right 07/20/2016   Procedure: OPEN REDUCTION INTERNAL FIXATION (ORIF) PROXIMAL HUMERUS FRACTURE VS REVERSE TOTAL SHOULDER ARTHROPLASTY;  Surgeon: Netta Cedars, MD;  Location: [facility];  Service: Orthopedics;  Laterality: Right;   SHOULDER ARTHROSCOPY Right 07/2016   TRANSTHORACIC  ECHOCARDIOGRAM  09-23-2007   pseudonormal LV filling pattern,  ef 65-70%/  trivial AR/  mild LAE   TUBAL LIGATION  1985    SOCIAL HISTORY:  reports that she has never smoked. She has never used smokeless tobacco. She reports current alcohol use. She reports that she does not use drugs.  Allergies  Allergen Reactions   Aleve [Naproxen] Hives, Shortness Of Breath and Rash    Pt Avoids All Nsaids   Protonix [Pantoprazole Sodium] Other (See Comments)    Makes her feel wreidd   Zocor [Simvastatin] Other (See Comments)    "weird feeling all over body"    FAMILY HISTORY: Family History  Problem Relation Age of Onset   Heart Problems Father    Stroke Mother    Hypertension Unknown    Stroke Unknown    Breast cancer Sister      Prior to Admission medications   Medication Sig Start Date End Date Taking? Authorizing Provider  azelastine (ASTELIN) 0.1 % nasal spray Place 2 sprays into both nostrils 2 (two) times daily.    Yes [provider]  carbidopa-levodopa (SINEMET IR) 25-100 MG tablet 2 tablets TID Patient taking differently: Take 2 tablets by mouth 3 (three) times daily.  07/08/18  Yes Tat, Eustace Quail, DO  diclofenac sodium (  VOLTAREN) 1 % GEL Apply 4 g topically 4 (four) times daily. 12/16/18  Yes [provider]  escitalopram (LEXAPRO) 10 MG tablet Take 15 mg by mouth daily.    Yes [provider]  fenofibrate 160 MG tablet Take 160 mg by mouth every morning.    Yes [provider]  montelukast (SINGULAIR) 10 MG tablet Take 10 mg by mouth every evening.    Yes [provider]  nystatin cream (MYCOSTATIN) Apply 1 application topically 2 (two) times daily.    Yes [provider]  olmesartan (BENICAR) 40 MG tablet Take 40 mg by mouth daily. 05/26/18  Yes [provider]  traMADol (ULTRAM) 50 MG tablet Take 1 tablet (50 mg total) by mouth every 6 (six) hours as needed for moderate pain or severe pain.  Post-operatively Patient taking differently: Take 50 mg by mouth See admin instructions. Every 6-8 hours as needed for pain 04/25/18 04/25/19 Yes Alexis Frock, MD  acetaminophen (TYLENOL) 325 MG tablet Take 650 mg by mouth 2 (two) times daily.     [provider]  aspirin 81 MG chewable tablet Chew 81 mg by mouth daily.    [provider]  Cholecalciferol (VITAMIN D PO) Take 5,000 Units by mouth daily.    [provider]  cyanocobalamin 500 MCG tablet Take 500 mcg by mouth daily.    [provider]  fluticasone (FLONASE) 50 MCG/ACT nasal spray Place 2 sprays into both nostrils daily.  12/23/14   [provider]  loratadine (CLARITIN) 10 MG tablet Take 10 mg by mouth. Taking every other day.    [provider]  Melatonin 5 MG CAPS Take 10 mg by mouth at bedtime as needed.     [provider]  Menthol, Topical Analgesic, (BIOFREEZE EX) Apply topically.    [provider]    Physical Exam: Vitals:   03/05/19 1915 03/05/19 2100 03/05/19 2118 03/05/19 2145  BP: (!) 145/77  (!) 153/91 (!) 144/42  Pulse: (!) 107  (!) 116 (!) 105  Resp: 20  (!) 21 (!) 22  Temp:  99.9 F (37.7 C)  (!) 103 F (39.4 C)  TempSrc:  Oral  Axillary  SpO2: 100%  97% 98%  Weight:    93.9 kg  Height:          Constitutional: NAD, calm, comfortable, overall weak and frail appearing Eyes: PERRL, lids and conjunctivae normal ENMT: Mucous membranes are moist. Posterior pharynx clear of any exudate or lesions.Normal dentition.  Neck: normal, supple, no masses, no thyromegaly Respiratory: Slightly diminished breath sounds at the bases Cardiovascular: Regular rate and rhythm, no murmurs / rubs / gallops. No extremity edema. 2+ pedal pulses. No carotid bruits.  Abdomen: no tenderness, no masses palpated. No hepatosplenomegaly. Bowel sounds positive.  Musculoskeletal: no clubbing / cyanosis. No joint deformity upper and lower extremities. Good ROM, no  contractures. Normal muscle tone.  Skin: Left buttock area erythema with slight drainage and superficial fluctuant mass.  Slightly warm to touch. Neurologic: CN 2-12 grossly intact. Sensation intact, DTR normal. Strength 5/5 in all 4.  Psychiatric: Alert awake oriented X3    Labs on Admission: I have personally reviewed following labs and imaging studies  CBC: Recent Labs  Lab 03/05/19 1810  WBC 13.2*  NEUTROABS 11.3*  HGB 11.5*  HCT 35.7*  MCV 92.5  PLT 025   Basic Metabolic Panel: Recent Labs  Lab 03/05/19 1810  NA 136  K 3.7  CL 102  CO2  22  GLUCOSE 200*  BUN 16  CREATININE 0.76  CALCIUM 9.7   GFR: Estimated Creatinine Clearance: 72.2 mL/min (by C-G formula based on SCr of 0.76 mg/dL). Liver Function Tests: Recent Labs  Lab 03/05/19 1810  AST 19  ALT 18  ALKPHOS 75  BILITOT 0.6  PROT 6.7  ALBUMIN 3.4*   No results for input(s): LIPASE, AMYLASE in the last 168 hours. No results for input(s): AMMONIA in the last 168 hours. Coagulation Profile: No results for input(s): INR, PROTIME in the last 168 hours. Cardiac Enzymes: No results for input(s): CKTOTAL, CKMB, CKMBINDEX, TROPONINI in the last 168 hours. BNP (last 3 results) No results for input(s): PROBNP in the last 8760 hours. HbA1C: No results for input(s): HGBA1C in the last 72 hours. CBG: Recent Labs  Lab 03/05/19 2300  GLUCAP 108*   Lipid Profile: No results for input(s): CHOL, HDL, LDLCALC, TRIG, CHOLHDL, LDLDIRECT in the last 72 hours. Thyroid Function Tests: No results for input(s): TSH, T4TOTAL, FREET4, T3FREE, THYROIDAB in the last 72 hours. Anemia Panel: No results for input(s): VITAMINB12, FOLATE, FERRITIN, TIBC, IRON, RETICCTPCT in the last 72 hours. Urine analysis:    Component Value Date/Time   COLORURINE YELLOW 03/05/2019 2020   APPEARANCEUR CLEAR 03/05/2019 2020   LABSPEC 1.015 03/05/2019 2020   PHURINE 6.0 03/05/2019 2020   GLUCOSEU NEGATIVE 03/05/2019 2020   HGBUR LARGE  (A) 03/05/2019 2020   BILIRUBINUR NEGATIVE 03/05/2019 2020   [place] 03/05/2019 2020   PROTEINUR 30 (A) 03/05/2019 2020   NITRITE NEGATIVE 03/05/2019 2020   LEUKOCYTESUR MODERATE (A) 03/05/2019 2020   Sepsis Labs: !!!!!!!!!!!!!!!!!!!!!!!!!!!!!!!!!!!!!!!!!!!! @LABRCNTIP (procalcitonin:4,lacticidven:4) )No results found for this or any previous visit (from the past 240 hour(s)).   Radiological Exams on Admission: Ct Pelvis W Contrast  Result Date: 03/05/2019 CLINICAL DATA:  68 year old female with a history of decubitus ulcer EXAM: CT PELVIS WITH CONTRAST TECHNIQUE: Multidetector CT imaging of the pelvis was performed using the standard protocol following the bolus administration of intravenous contrast. CONTRAST:  136mL OMNIPAQUE IOHEXOL 300 MG/ML  SOLN COMPARISON:  03/28/2018 FINDINGS: Urinary Tract: Visualized aspects of the inferior kidneys demonstrate right-sided nonobstructive nephrolithiasis, and low-density cysts bilaterally. Urinary bladder partially distended. Unremarkable appearance of the ureters. Bowel: Visualized small bowel unremarkable. Normal appendix. Unremarkable colon with no inflammatory changes. Vascular/Lymphatic: Mild atherosclerosis of the abdominal aorta. Bilateral iliac arteries and proximal femoral arteries are patent. Inguinal lymph nodes are present, more numerous on the left, potentially reactive. Small lymph nodes of the left iliac nodal station. Reproductive:  Unremarkable uterus. Other: Inflammation/infiltration of the soft tissues of the proximal left thigh extending to the gluteal fold and into the left inguinal intertriginous fold. No focal fluid. No extension into the ischial rectal fossa or into the pelvis. Trace contralateral skin thickening of the right medial thigh/gluteal region. No abdominal hernia. Musculoskeletal: No displaced fracture. Degenerative changes of the lower lumbar spine including vacuum disc phenomenon spanning L3-S1. IMPRESSION:  Inflammation in the posteromedial left thigh extending to the gluteal fold and to the left inguinal intertriginous fold, more compatible with cellulitis than decubitus ulcer. No evidence of abscess. Likely reactive left inguinal adenopathy. Ancillary findings as above. Electronically Signed   By: Corrie Mckusick D.O.   On: 03/05/2019 20:08   Dg Chest Port 1 View  Result Date: 03/05/2019 CLINICAL DATA:  Fever EXAM: PORTABLE CHEST 1 VIEW COMPARISON:  03/30/2018 FINDINGS: Heart and mediastinal contours are within normal limits. No focal opacities or effusions. No acute bony abnormality. IMPRESSION: No  active cardiopulmonary disease. Electronically Signed   By: Rolm Baptise M.D.   On: 03/05/2019 19:07     All images have been reviewed by me personally.  EKG: Independently reviewed.  Sinus tachycardia with left anterior fascicular block  Assessment/Plan Principal Problem:   Sepsis ([place]) Active Problems:   Diabetes type 2, uncontrolled (HCC)   Lumbar radiculopathy   OSA on CPAP   Cellulitis of buttock, left   Sepsis present prior to admission secondary to acute cellulitis of the left buttock area with mildly draining abscess.  - Sepsis protocol initiated-sinus tachycardia, fever, lactic acidosis and leukocytosis.  For now admit the patient to progressive care team -Started patient IV vancomycin and Rocephin - Consult wound care team.  Supportive care -Pain control, bowel regimen. -Antihypertensives on hold.  IV fluids.  Monitor urine output.  Generalized weakness -Secondary to underlying infection.  Once she feeling little better, she will need PT/OT.  Diabetes mellitus type 2 -Does not appear to be on any home medications.  Insulin sliding scale Accu-Chek  History of Parkinson's disease -Continue Sinemet.  On Lexapro.  Essential hypertension -Olmesartan on hold  Obstructive sleep apnea -Supportive care.  DVT prophylaxis: Subcutaneous heparin Code Status: Full code Family  Communication: None at bedside Disposition Plan: To be determined Consults called: None Admission status: Inpatient admission to progressive care unit   Time Spent: 65 minutes.  >50% of the time was devoted to discussing the patients care, assessment, plan and disposition with other care givers along with counseling the patient about the risks and benefits of treatment.    Kian Ottaviano Arsenio Loader MD [facility]  If 7PM-7AM, please contact night-coverage [email or web address]  03/05/2019, 11:19 PM

## 2019-03-05 NOTE — ED Provider Notes (Addendum)
[facility] EMERGENCY DEPARTMENT Provider Note   CSN: [id number] Arrival date & time: 03/05/19  1738    History   Chief Complaint Chief Complaint  Patient presents with   Weakness   Fever    HPI [name] [name] is a 68 y.o. female.     Patient is a 67 year old female with a history of diabetes, acute diastolic CHF, hypertension, hyperlipidemia, septic shock, UTIs, obesity who is presenting today with fever and generalized weakness.  Patient has home health come in daily but has otherwise been at her home.  They have noticed a bedsore that they have been watching but she is not currently on any antibiotics.  Patient states over the last few days she is just felt generally weak and unwell.  She denied any chest pain, cough or shortness of breath.  She states that EMS has been called out twice today the second time she had slid out of her chair and was lying on the floor.  They states she was responsive upon arrival but oxygen saturation seems to actuate between 90 and 99%.  Patient denies any abdominal pain or urinary symptoms.  EMS gave her liquid Tylenol and said after that time she started to cough.  The history is provided by the patient and the EMS personnel.    Past Medical History:  Diagnosis Date   Acute kidney failure (HCC)    hx of    Arthritis    fingers,right shoulder   Bradycardia    Bulge of cervical disc without myelopathy    C4 -- C7  and stenosis   CHF (congestive heart failure) (HCC)    acute diastolic ( congestive ) heart failure)    Chronic lumbar radiculopathy    L5- S1  right leg   Constipation    Depression    Diverticulosis large intestine w/o perforation or abscess w/bleeding    Dizziness    Dry eye syndrome of bilateral lacrimal glands    Frequency of urination    GERD (gastroesophageal reflux disease)    Hard of hearing    History of kidney stones 05/12/15   surgery    HTN (hypertension)    Hyperlipidemia     Hypokalemia    Morbid obesity due to excess calories ([place])    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[place])    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HCC)    Numbness and tingling in hands    OSA (obstructive sleep apnea)    moderate OSA per study 11-22-2007--  refused CPAP but used oxygen for 6 months at night,  states due to wt loss stopped using oxygen   Polyneuropathy, diabetic (HCC)    WALKS W/ CANE FOR BALANCE   Progressive supranuclear ophthalmoplegia (HCC)    Progressive supranuclear palsy (HCC)    Pyonephrosis    Pyonephrosis    Radiculopathy of lumbar region    Respiratory failure (HCC)    hx of acute respiratory failure wtih hypoxia    Right ureteral stone    Severe sepsis with septic shock (CODE) (HCC)    Shortness of breath dyspnea    Spinal stenosis, cervical region    SUI (stress urinary incontinence, female)    Tubulo-interstitial nephritis    Type 2 diabetes mellitus (HCC)    Type II - Diet controlled   Urinary incontinence    Urinary tract infection    Wears glasses     Patient Active Problem List   Diagnosis Date Noted   Bradycardia 04/01/2018  UTI (urinary tract infection) 04/01/2018   ARF (acute renal failure) ([place]) 04/01/2018   Right ureteral stone 65/46/5035   Acute diastolic heart failure ([place]) 04/01/2018   Acute respiratory failure with hypoxia (HCC)    Pyelonephritis    Severe sepsis ([place])    Pyohydronephrosis 03/29/2018   Septic shock ([place]) 03/29/2018   Hypokalemia 03/28/2018   Severe sepsis with septic shock ([place]) 03/28/2018   Elevated lactic acid level 03/28/2018   Elevated troponin 03/28/2018   PSP (progressive supranuclear palsy) ([place]) 02/18/2018   Chronic cough 11/19/2017   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   Cervical stenosis of spinal canal 03/23/2016   OSA on CPAP 12/14/2015   Dizziness and giddiness 11/24/2013   Diabetes type 2, uncontrolled ([place]) 11/24/2013   Severe obesity (BMI >= 40) ([place]) 11/24/2013     Lumbar radiculopathy 11/24/2013    Past Surgical History:  Procedure Laterality Date   ANTERIOR CERVICAL DECOMP/DISCECTOMY FUSION N/A 03/23/2016   Procedure: Cervical Four-Five, Cervical Five-Six, Cervical Six-Seven Anterior cervical decompression/diskectomy/fusion;  Surgeon: Leeroy Cha, MD;  Location: [facility];  Service: Neurosurgery;  Laterality: N/A;  C4-5 C5-6 C6-7 Anterior cervical decompression/diskectomy/fusion   CARDIOVASCULAR STRESS TEST  09-30-2007     normal Adenosine study/  no ischemia /  normal LV function and wall motion , ef 82%   COLONOSCOPY     CYSTOSCOPY WITH RETROGRADE PYELOGRAM, URETEROSCOPY AND STENT PLACEMENT Right 05/12/2015   Procedure: CYSTOSCOPY WITH RETROGRADE PYELOGRAM, URETEROSCOPY,STONE EXTRACTION AND STENT PLACEMENT;  Surgeon: Franchot Gallo, MD;  Location: [facility];  Service: Urology;  Laterality: Right;   CYSTOSCOPY WITH RETROGRADE PYELOGRAM, URETEROSCOPY AND STENT PLACEMENT Right 04/25/2018   Procedure: CYSTOSCOPY WITH RETROGRADE PYELOGRAM, URETEROSCOPY AND STENT EXCHANGE;  Surgeon: Alexis Frock, MD;  Location: [facility];  Service: Urology;  Laterality: Right;   CYSTOSCOPY WITH STENT PLACEMENT Right 03/28/2018   Procedure: CYSTOSCOPY WITH STENT PLACEMENT retrograde pylegram;  Surgeon: Alexis Frock, MD;  Location: [facility];  Service: Urology;  Laterality: Right;   EXTRACORPOREAL SHOCK WAVE LITHOTRIPSY Right 08-18-2012   HOLMIUM LASER APPLICATION Right 4/65/6812   Procedure: HOLMIUM LASER APPLICATION;  Surgeon: Franchot Gallo, MD;  Location: [facility];  Service: Urology;  Laterality: Right;   HOLMIUM LASER APPLICATION Right 7/51/7001   Procedure: HOLMIUM LASER APPLICATION;  Surgeon: Alexis Frock, MD;  Location: [facility];  Service: Urology;  Laterality: Right;   ORIF HUMERUS FRACTURE Right 07/20/2016   Procedure: OPEN REDUCTION INTERNAL FIXATION (ORIF) PROXIMAL HUMERUS FRACTURE VS REVERSE TOTAL SHOULDER  ARTHROPLASTY;  Surgeon: Netta Cedars, MD;  Location: [place];  Service: Orthopedics;  Laterality: Right;   SHOULDER ARTHROSCOPY Right 07/2016   TRANSTHORACIC ECHOCARDIOGRAM  09-23-2007   pseudonormal LV filling pattern,  ef 65-70%/  trivial AR/  mild LAE   TUBAL LIGATION  1985     OB History   No obstetric history on file.      Home Medications    Prior to Admission medications   Medication Sig Start Date End Date Taking? Authorizing Provider  acetaminophen (TYLENOL) 325 MG tablet Take 650 mg by mouth 2 (two) times daily.     [provider]  aspirin 81 MG chewable tablet Chew 81 mg by mouth daily.    [provider]  azelastine (ASTELIN) 0.1 % nasal spray Place 1 spray into both nostrils 2 (two) times daily.  12/23/14   [provider]  benzonatate (TESSALON) 200 MG capsule TAKE 1 CAPSULE (200 MG) BY MOUTH 3 TIMES PER DAY WHEN NEEDED  FOR COUGH 10/05/18   [provider]  carbidopa-levodopa (SINEMET IR) 25-100 MG tablet 2 tablets TID 07/08/18   Tat, Eustace Quail, DO  Cholecalciferol (VITAMIN D PO) Take 5,000 Units by mouth daily.    [provider]  cyanocobalamin 500 MCG tablet Take 500 mcg by mouth daily.    [provider]  escitalopram (LEXAPRO) 10 MG tablet Take 10 mg by mouth at bedtime.  06/08/16   [provider]  fenofibrate 160 MG tablet Take 160 mg by mouth every morning.     [provider]  fluticasone (FLONASE) 50 MCG/ACT nasal spray Place 2 sprays into both nostrils daily.  12/23/14   [provider]  loratadine (CLARITIN) 10 MG tablet Take 10 mg by mouth. Taking every other day.    [provider]  Melatonin 5 MG CAPS Take 10 mg by mouth at bedtime as needed.     [provider]  Menthol, Topical Analgesic, (BIOFREEZE EX) Apply topically.    [provider]  montelukast (SINGULAIR) 10 MG tablet Take 10 mg by mouth at bedtime.    [provider]  nystatin cream  (MYCOSTATIN) Apply 1 application topically daily. 05/25/18   [provider]  olmesartan (BENICAR) 40 MG tablet Take 40 mg by mouth daily. 05/26/18   [provider]  traMADol (ULTRAM) 50 MG tablet Take 1 tablet (50 mg total) by mouth every 6 (six) hours as needed for moderate pain or severe pain. Post-operatively 04/25/18 04/25/19  Alexis Frock, MD    Family History Family History  Problem Relation Age of Onset   Heart Problems Father    Stroke Mother    Hypertension Unknown    Stroke Unknown    Breast cancer Sister     Social History Social History   Tobacco Use   Smoking status: Never Smoker   Smokeless tobacco: Never Used  Substance Use Topics   Alcohol use: Yes    Alcohol/week: 0.0 standard drinks    Comment: 3- 4 times a year   Drug use: No     Allergies   Aleve [naproxen]; Protonix [pantoprazole sodium]; and Zocor [simvastatin]   Review of Systems Review of Systems  All other systems reviewed and are negative.    Physical Exam Updated Vital Signs BP (!) 150/103    Pulse (!) 120    Temp (!) 102.3 F (39.1 C) (Oral)    Resp 18    Ht 5' 1.5" (1.562 m)    Wt 86.2 kg    SpO2 95%    BMI 35.32 kg/m   Physical Exam Vitals signs and nursing note reviewed.  Constitutional:      Appearance: She is well-developed. She is obese. She is ill-appearing.  HENT:     Head: Normocephalic and atraumatic.     Mouth/Throat:     Mouth: Mucous membranes are dry.  Eyes:     Pupils: Pupils are equal, round, and reactive to light.  Neck:     Comments: Kyphosis Cardiovascular:     Rate and Rhythm: Regular rhythm. Tachycardia present.     Pulses: Normal pulses.     Heart sounds: Normal heart sounds. No murmur. No friction rub.  Pulmonary:     Effort: Pulmonary effort is normal.     Breath sounds: Rales present.  Abdominal:     General: Bowel sounds are normal. There is no distension.     Palpations: Abdomen is soft.     Tenderness: There is no  abdominal tenderness. There is no guarding or rebound.  Genitourinary:   Musculoskeletal: Normal range of motion.        General: No tenderness.     Right lower leg: No edema.     Left lower leg: No edema.     Comments: No edema  Skin:    General: Skin is warm and dry.     Capillary Refill: Capillary refill takes 2 to 3 seconds.     Findings: No rash.  Neurological:     Mental Status: She is alert and oriented to person, place, and time.     Cranial Nerves: No cranial nerve deficit.  Psychiatric:     Comments: Calm and cooperative      ED Treatments / Results  Labs (all labs ordered are listed, but only abnormal results are displayed) Labs Reviewed  LACTIC ACID, PLASMA - Abnormal; Notable for the following components:      Result Value   Lactic Acid, Venous 2.4 (*)    All other components within normal limits  COMPREHENSIVE METABOLIC PANEL - Abnormal; Notable for the following components:   Glucose, Bld 200 (*)    Albumin 3.4 (*)    All other components within normal limits  CBC WITH DIFFERENTIAL/PLATELET - Abnormal; Notable for the following components:   WBC 13.2 (*)    RBC 3.86 (*)    Hemoglobin 11.5 (*)    HCT 35.7 (*)    Neutro Abs 11.3 (*)    Abs Immature Granulocytes 0.15 (*)    All other components within normal limits  URINALYSIS, ROUTINE W REFLEX MICROSCOPIC - Abnormal; Notable for the following components:   Hgb urine dipstick LARGE (*)    Protein, ur 30 (*)    Leukocytes,Ua MODERATE (*)    Bacteria, UA RARE (*)    All other components within normal limits  CULTURE, BLOOD (ROUTINE X 2)  CULTURE, BLOOD (ROUTINE X 2)  URINE CULTURE  LACTIC ACID, PLASMA    EKG None  ED ECG REPORT   Date: 03/05/2019  Rate: 118  Rhythm: sinus tachycardia  QRS Axis: normal  Intervals: normal  ST/T Wave abnormalities: normal  Conduction Disutrbances:none  Narrative Interpretation:   Old EKG Reviewed: unchanged  I have personally reviewed the EKG tracing and  agree with the computerized printout as noted.   Radiology Ct Pelvis W Contrast  Result Date: 03/05/2019 CLINICAL DATA:  68 year old female with a history of decubitus ulcer EXAM: CT PELVIS WITH CONTRAST TECHNIQUE: Multidetector CT imaging of the pelvis was performed using the standard protocol following the bolus administration of intravenous contrast. CONTRAST:  163mL OMNIPAQUE IOHEXOL 300 MG/ML  SOLN COMPARISON:  03/28/2018 FINDINGS: Urinary Tract: Visualized aspects of the inferior kidneys demonstrate right-sided nonobstructive nephrolithiasis, and low-density cysts bilaterally. Urinary bladder partially distended. Unremarkable appearance of the ureters. Bowel: Visualized small bowel unremarkable. Normal appendix. Unremarkable colon with no inflammatory changes. Vascular/Lymphatic: Mild atherosclerosis of the abdominal aorta. Bilateral iliac arteries and proximal femoral arteries are patent. Inguinal lymph nodes are present, more numerous on the left, potentially reactive. Small lymph nodes of the left iliac nodal station. Reproductive:  Unremarkable uterus. Other: Inflammation/infiltration of the soft tissues of the proximal left thigh extending to the gluteal fold and into the left inguinal intertriginous fold. No focal fluid. No extension into the ischial rectal fossa or into the pelvis. Trace contralateral skin thickening of the right medial thigh/gluteal region. No abdominal hernia. Musculoskeletal: No displaced fracture. Degenerative changes of the lower lumbar spine including vacuum  disc phenomenon spanning L3-S1. IMPRESSION: Inflammation in the posteromedial left thigh extending to the gluteal fold and to the left inguinal intertriginous fold, more compatible with cellulitis than decubitus ulcer. No evidence of abscess. Likely reactive left inguinal adenopathy. Ancillary findings as above. Electronically Signed   By: Corrie Mckusick D.O.   On: 03/05/2019 20:08   Dg Chest Port 1 View  Result Date:  03/05/2019 CLINICAL DATA:  Fever EXAM: PORTABLE CHEST 1 VIEW COMPARISON:  03/30/2018 FINDINGS: Heart and mediastinal contours are within normal limits. No focal opacities or effusions. No acute bony abnormality. IMPRESSION: No active cardiopulmonary disease. Electronically Signed   By: Rolm Baptise M.D.   On: 03/05/2019 19:07    Procedures Procedures (including critical care time)  Medications Ordered in ED Medications  sodium chloride 0.9 % bolus 1,000 mL (has no administration in time range)    And  sodium chloride 0.9 % bolus 1,000 mL (has no administration in time range)    And  sodium chloride 0.9 % bolus 1,000 mL (has no administration in time range)  cefTRIAXone (ROCEPHIN) 2 g in sodium chloride 0.9 % 100 mL IVPB (has no administration in time range)  vancomycin (VANCOCIN) 1,500 mg in sodium chloride 0.9 % 500 mL IVPB (has no administration in time range)     Initial Impression / Assessment and Plan / ED Course  I have reviewed the triage vital signs and the nursing notes.  Pertinent labs & imaging results that were available during my care of the patient were reviewed by me and considered in my medical decision making (see chart for details).        Elderly female presenting today as a code sepsis with a temperature of 102.3, tachycardia in the 120s.  Patient is having some mild cough after being given Tylenol in route but prior denied any coughing, chest pain or shortness of breath.  Patient has concern for large abscess in her perineum.  No evidence of Fournier's gangrene at this time.  There is no crepitus or skin sloughing.  CT to further evaluate this area is pending.  Also sepsis labs ordered.  Patient covered with vancomycin and Rocephin.  Chest x-ray, EKG, blood cultures pending.  8:26 PM Patient's CMP with hyperglycemia of 200 but no other acute findings, CBC with a leukocytosis of 1300 and stable hemoglobin, lactate elevated at 2.4, chest x-ray is clear.  EKG shows  sinus started tachycardia but no other acute findings.  CT of the pelvis shows evidence of cellulitis at this time but no drainable abscess.  Patient was covered with antibiotics already.  After fluid bolus patient's heart rate has improved.  Blood pressure remained stable.  Will admit for further care.  CRITICAL CARE Performed by: Linnette Panella Total critical care time: 30 minutes Critical care time was exclusive of separately billable procedures and treating other patients. Critical care was necessary to treat or prevent imminent or life-threatening deterioration. Critical care was time spent personally by me on the following activities: development of treatment plan with patient and/or surrogate as well as nursing, discussions with consultants, evaluation of patient's response to treatment, examination of patient, obtaining history from patient or surrogate, ordering and performing treatments and interventions, ordering and review of laboratory studies, ordering and review of radiographic studies, pulse oximetry and re-evaluation of patient's condition.  [name] [name] was evaluated in Emergency Department on 03/05/2019 for the symptoms described in the history of present illness. She was evaluated in the context of the global  COVID-19 pandemic, which necessitated consideration that the patient might be at risk for infection with the SARS-CoV-2 virus that causes COVID-19. Institutional protocols and algorithms that pertain to the evaluation of patients at risk for COVID-19 are in a state of rapid change based on information released by regulatory bodies including the CDC and federal and state organizations. These policies and algorithms were followed during the patient's care in the ED.    Final Clinical Impressions(s) / ED Diagnoses   Final diagnoses:  Sepsis, due to unspecified organism, unspecified whether acute organ dysfunction present [facility])  Acute female pelvic cellulitis    ED  Discharge Orders    None       Blanchie Dessert, MD 03/05/19 2027    Blanchie Dessert, MD 03/05/19 2137

## 2019-03-05 NOTE — ED Notes (Signed)
ED TO INPATIENT HANDOFF REPORT  ED Nurse Name and Phone #: Caprice Kluver [phone]  S Name/Age/Gender [name] [name] 68 y.o. female Room/Bed: 023C/023C  Code Status   Code Status: Prior  Home/SNF/Other Home Patient oriented to: self, time, place, orientation Is this baseline? Yes   Triage Complete: Triage complete  Chief Complaint Temp 103.1  Triage Note Per [facility], Pt from independent living on [address]. Pt reports weakness x 2 days, pt slid from chair to floor today, denies head injury, no LOC. Pt has pressure ulcer to sacrum, is not taking antibiotics. Home health sees her regularly for this. Pt took tylenol at lunch, pt received 1000 mg of tylenol with EMS about 20 minutes ago. EMS found pt to have temp of 103 temporal. Pt denies cp, SHOB, . Pt appears to be short of breath but reports she feels her breathing is okay. EMS found pt's O2 sats to be 87-98%, RR 32. Pt placed on 2 L [place]. She denies sick contact or recent travel. CBG 230.   Allergies Allergies  Allergen Reactions  . Aleve [Naproxen] Hives, Shortness Of Breath and Rash    Pt Avoids All Nsaids  . Protonix [Pantoprazole Sodium] Other (See Comments)    Makes her feel wreidd  . Zocor [Simvastatin] Other (See Comments)    "weird feeling all over body"    Level of Care/Admitting Diagnosis ED Disposition    ED Disposition Condition [place]: [place] [100100]  Level of Care: Progressive [102]  Covid Evaluation: N/A  Diagnosis: Sepsis [facility]) [[id number]]  Admitting Physician: Gerlean Ren [facility] [[id number]]  Attending Physician: Gerlean Ren [facility] [[id number]]  Estimated length of stay: past midnight tomorrow  Certification:: I certify this patient will need inpatient services for at least 2 midnights  PT Class (Do Not Modify): Inpatient [101]  PT Acc Code (Do Not Modify): Private [1]       B Medical/Surgery History Past Medical History:  Diagnosis Date  . Acute kidney  failure (HCC)    hx of   . Arthritis    fingers,right shoulder  . Bradycardia   . 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 CHF (congestive heart failure) (HCC)    acute diastolic ( congestive ) heart failure)   . Chronic lumbar radiculopathy    L5- S1  right leg  . Constipation   . Depression   . Diverticulosis large intestine w/o perforation or abscess w/bleeding   . Dizziness   . Dry eye syndrome of bilateral lacrimal glands   . Frequency of urination   . GERD (gastroesophageal reflux disease)   . Hard of hearing   . History of kidney stones 05/12/15   surgery   . HTN (hypertension)   . Hyperlipidemia   . Hypokalemia   . Morbid obesity due to excess calories ([place])   . 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 Numbness and tingling in hands   . OSA (obstructive sleep apnea)    moderate OSA per study 11-22-2007--  refused CPAP but used oxygen for 6 months at night,  states due to wt loss stopped using oxygen  . Polyneuropathy, diabetic ([place])    WALKS W/ CANE FOR BALANCE  . 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 Pyonephrosis   . Pyonephrosis   . Radiculopathy of lumbar region   .  Respiratory failure (HCC)    hx of acute respiratory failure wtih hypoxia   . Right ureteral stone   . Severe sepsis with septic shock (CODE) ([place])   . Shortness of breath dyspnea   . Spinal stenosis, cervical region   . SUI (stress urinary incontinence, female)   . Tubulo-interstitial nephritis   . Type 2 diabetes mellitus (HCC)    Type II - Diet controlled  . Urinary incontinence   . Urinary tract infection   . Wears glasses    Past Surgical History:  Procedure Laterality Date  . ANTERIOR CERVICAL DECOMP/DISCECTOMY FUSION N/A 03/23/2016   Procedure: Cervical Four-Five, Cervical Five-Six, Cervical Six-Seven Anterior cervical decompression/diskectomy/fusion;  Surgeon: Leeroy Cha, MD;  Location: [facility];  Service: Neurosurgery;  Laterality: N/A;  C4-5 C5-6 C6-7 Anterior cervical decompression/diskectomy/fusion  . CARDIOVASCULAR STRESS TEST  09-30-2007     normal Adenosine study/  no ischemia /  normal LV function and wall motion , ef 82%  . COLONOSCOPY    . CYSTOSCOPY WITH RETROGRADE PYELOGRAM, URETEROSCOPY AND STENT PLACEMENT Right 05/12/2015   Procedure: CYSTOSCOPY WITH RETROGRADE PYELOGRAM, URETEROSCOPY,STONE EXTRACTION AND STENT PLACEMENT;  Surgeon: Franchot Gallo, MD;  Location: [facility];  Service: Urology;  Laterality: Right;  . CYSTOSCOPY WITH RETROGRADE PYELOGRAM, URETEROSCOPY AND STENT PLACEMENT Right 04/25/2018   Procedure: CYSTOSCOPY WITH RETROGRADE PYELOGRAM, URETEROSCOPY AND STENT EXCHANGE;  Surgeon: Alexis Frock, MD;  Location: [facility];  Service: Urology;  Laterality: Right;  . CYSTOSCOPY WITH STENT PLACEMENT Right 03/28/2018   Procedure: CYSTOSCOPY WITH STENT PLACEMENT retrograde pylegram;  Surgeon: Alexis Frock, MD;  Location: [facility];  Service: Urology;  Laterality: Right;  . EXTRACORPOREAL SHOCK WAVE LITHOTRIPSY Right 08-18-2012  . HOLMIUM LASER APPLICATION Right 9/76/7341   Procedure: HOLMIUM LASER APPLICATION;  Surgeon: Franchot Gallo, MD;  Location: [facility];  Service: Urology;  Laterality: Right;  . HOLMIUM LASER APPLICATION Right 9/37/9024   Procedure: HOLMIUM LASER APPLICATION;  Surgeon: Alexis Frock, MD;  Location: [facility];  Service: Urology;  Laterality: Right;  . ORIF HUMERUS FRACTURE Right 07/20/2016   Procedure: OPEN REDUCTION INTERNAL FIXATION (ORIF) PROXIMAL HUMERUS FRACTURE VS REVERSE TOTAL SHOULDER ARTHROPLASTY;  Surgeon: Netta Cedars, MD;  Location: [facility];  Service: Orthopedics;  Laterality: Right;  . SHOULDER ARTHROSCOPY Right 07/2016  . TRANSTHORACIC ECHOCARDIOGRAM  09-23-2007   pseudonormal LV filling pattern,  ef 65-70%/  trivial AR/  mild LAE  . TUBAL LIGATION  1985     A IV  Location/Drains/Wounds Patient Lines/Drains/Airways Status   Active Line/Drains/Airways    Name:   Placement date:   Placement time:   Site:   Days:   Peripheral IV 05/27/18 Left Hand   05/27/18    1053    Hand   282   Peripheral IV 03/05/19 Right Forearm   03/05/19    1805    Forearm   less than 1   Peripheral IV 03/05/19 Right;Anterior Forearm   03/05/19    1825    Forearm   less than 1   Ureteral Drain/Stent Right ureter 5 Fr.   04/25/18    1319    Right ureter   314   External Urinary Catheter   05/27/18    1256    -   282   Incision (Closed) 07/20/16 Shoulder Right   07/20/16    1532     958   Incision (Closed) 04/25/18 Perineum Other (Comment)   04/25/18    1319  314          Intake/Output Last 24 hours  Intake/Output Summary (Last 24 hours) at 03/05/2019 2107 Last data filed at 03/05/2019 1858 Gross per 24 hour  Intake 2100 ml  Output -  Net 2100 ml    Labs/Imaging Results for orders placed or performed during the hospital encounter of 03/05/19 (from the past 48 hour(s))  Lactic acid, plasma     Status: Abnormal   Collection Time: 03/05/19  6:10 PM  Result Value Ref Range   Lactic Acid, Venous 2.4 (HH) 0.5 - 1.9 mmol/L    Comment: CRITICAL RESULT CALLED TO, READ BACK BY AND VERIFIED WITHLoletha Grayer [facility] RN AT [place] ON [phone] BY K FORSYTH Performed at [facility], [address], [place], [place] [address]   Comprehensive metabolic panel     Status: Abnormal   Collection Time: 03/05/19  6:10 PM  Result Value Ref Range   Sodium 136 135 - 145 mmol/L   Potassium 3.7 3.5 - 5.1 mmol/L   Chloride 102 98 - 111 mmol/L   CO2 22 22 - 32 mmol/L   Glucose, Bld 200 (H) 70 - 99 mg/dL   BUN 16 8 - 23 mg/dL   Creatinine, Ser 0.76 0.44 - 1.00 mg/dL   Calcium 9.7 8.9 - 10.3 mg/dL   Total Protein 6.7 6.5 - 8.1 g/dL   Albumin 3.4 (L) 3.5 - 5.0 g/dL   AST 19 15 - 41 U/L   ALT 18 0 - 44 U/L   Alkaline Phosphatase 75 38 - 126 U/L   Total Bilirubin 0.6 0.3 - 1.2 mg/dL   GFR  calc non Af Amer >60 >60 mL/min   GFR calc Af Amer >60 >60 mL/min   Anion gap 12 5 - 15    Comment: Performed at [facility] [address] [place], [place] [address]  CBC WITH DIFFERENTIAL     Status: Abnormal   Collection Time: 03/05/19  6:10 PM  Result Value Ref Range   WBC 13.2 (H) 4.0 - 10.5 K/uL   RBC 3.86 (L) 3.87 - 5.11 MIL/uL   Hemoglobin 11.5 (L) 12.0 - 15.0 g/dL   HCT 35.7 (L) 36.0 - 46.0 %   MCV 92.5 80.0 - 100.0 fL   MCH 29.8 26.0 - 34.0 pg   MCHC 32.2 30.0 - 36.0 g/dL   RDW 12.7 11.5 - 15.5 %   Platelets 238 150 - 400 K/uL   nRBC 0.0 0.0 - 0.2 %   Neutrophils Relative % 86 %   Neutro Abs 11.3 (H) 1.7 - 7.7 K/uL   Lymphocytes Relative 8 %   Lymphs Abs 1.0 0.7 - 4.0 K/uL   Monocytes Relative 5 %   Monocytes Absolute 0.7 0.1 - 1.0 K/uL   Eosinophils Relative 0 %   Eosinophils Absolute 0.0 0.0 - 0.5 K/uL   Basophils Relative 0 %   Basophils Absolute 0.0 0.0 - 0.1 K/uL   Immature Granulocytes 1 %   Abs Immature Granulocytes 0.15 (H) 0.00 - 0.07 K/uL    Comment: Performed at [facility], [address], [place], [place] [address]  Urinalysis, Routine w reflex microscopic     Status: Abnormal   Collection Time: 03/05/19  8:20 PM  Result Value Ref Range   Color, Urine YELLOW YELLOW   APPearance CLEAR CLEAR   Specific Gravity, Urine 1.015 1.005 - 1.030   pH 6.0 5.0 - 8.0   Glucose, UA NEGATIVE NEGATIVE  mg/dL   Hgb urine dipstick LARGE (A) NEGATIVE   Bilirubin Urine NEGATIVE NEGATIVE   Ketones, ur NEGATIVE NEGATIVE mg/dL   Protein, ur 30 (A) NEGATIVE mg/dL   Nitrite NEGATIVE NEGATIVE   Leukocytes,Ua MODERATE (A) NEGATIVE   RBC / HPF 11-20 0 - 5 RBC/hpf   WBC, UA 6-10 0 - 5 WBC/hpf   Bacteria, UA RARE (A) NONE SEEN   Squamous Epithelial / LPF 0-5 0 - 5    Comment: Performed at [facility], [place] [address] [place], [place] [address]   Ct Pelvis W Contrast  Result Date: 03/05/2019 CLINICAL DATA:  68 year old female with a history of  decubitus ulcer EXAM: CT PELVIS WITH CONTRAST TECHNIQUE: Multidetector CT imaging of the pelvis was performed using the standard protocol following the bolus administration of intravenous contrast. CONTRAST:  166mL OMNIPAQUE IOHEXOL 300 MG/ML  SOLN COMPARISON:  03/28/2018 FINDINGS: Urinary Tract: Visualized aspects of the inferior kidneys demonstrate right-sided nonobstructive nephrolithiasis, and low-density cysts bilaterally. Urinary bladder partially distended. Unremarkable appearance of the ureters. Bowel: Visualized small bowel unremarkable. Normal appendix. Unremarkable colon with no inflammatory changes. Vascular/Lymphatic: Mild atherosclerosis of the abdominal aorta. Bilateral iliac arteries and proximal femoral arteries are patent. Inguinal lymph nodes are present, more numerous on the left, potentially reactive. Small lymph nodes of the left iliac nodal station. Reproductive:  Unremarkable uterus. Other: Inflammation/infiltration of the soft tissues of the proximal left thigh extending to the gluteal fold and into the left inguinal intertriginous fold. No focal fluid. No extension into the ischial rectal fossa or into the pelvis. Trace contralateral skin thickening of the right medial thigh/gluteal region. No abdominal hernia. Musculoskeletal: No displaced fracture. Degenerative changes of the lower lumbar spine including vacuum disc phenomenon spanning L3-S1. IMPRESSION: Inflammation in the posteromedial left thigh extending to the gluteal fold and to the left inguinal intertriginous fold, more compatible with cellulitis than decubitus ulcer. No evidence of abscess. Likely reactive left inguinal adenopathy. Ancillary findings as above. Electronically Signed   By: Corrie Mckusick D.O.   On: 03/05/2019 20:08   Dg Chest Port 1 View  Result Date: 03/05/2019 CLINICAL DATA:  Fever EXAM: PORTABLE CHEST 1 VIEW COMPARISON:  03/30/2018 FINDINGS: Heart and mediastinal contours are within normal limits. No focal  opacities or effusions. No acute bony abnormality. IMPRESSION: No active cardiopulmonary disease. Electronically Signed   By: Rolm Baptise M.D.   On: 03/05/2019 19:07    Pending Labs Unresulted Labs (From admission, onward)    Start     Ordered   03/05/19 1801  Urine culture  ONCE - STAT,   STAT     03/05/19 1802   03/05/19 1800  Lactic acid, plasma  Now then every 2 hours,   STAT     03/05/19 1802   03/05/19 1800  Blood Culture (routine x 2)  BLOOD CULTURE X 2,   STAT     03/05/19 1802          Vitals/Pain Today's Vitals   03/05/19 1750 03/05/19 1815 03/05/19 1915 03/05/19 2100  BP:  134/84 (!) 145/77   Pulse:  (!) 122 (!) 107   Resp:  20 20   Temp:    99.9 F (37.7 C)  TempSrc:    Oral  SpO2:  94% 100%   Weight: 86.2 kg     Height: 5' 1.5" (1.562 m)     PainSc:        Isolation Precautions No active isolations  Medications Medications  vancomycin (  VANCOCIN) 1,500 mg in sodium chloride 0.9 % 500 mL IVPB (1,500 mg Intravenous New Bag/Given 03/05/19 1913)  vancomycin (VANCOCIN) IVPB 1000 mg/200 mL premix (has no administration in time range)  sodium chloride 0.9 % bolus 1,000 mL (0 mLs Intravenous Stopped 03/05/19 1855)    And  sodium chloride 0.9 % bolus 1,000 mL (0 mLs Intravenous Stopped 03/05/19 1857)    And  sodium chloride 0.9 % bolus 1,000 mL (1,000 mLs Intravenous New Bag/Given 03/05/19 1915)  cefTRIAXone (ROCEPHIN) 2 g in sodium chloride 0.9 % 100 mL IVPB (0 g Intravenous Stopped 03/05/19 1858)  iohexol (OMNIPAQUE) 300 MG/ML solution 100 mL (100 mLs Intravenous Contrast Given 03/05/19 1937)    Mobility walks High fall risk   Focused Assessments NA   R Recommendations: See Admitting Provider Note  Report given to:   Additional Notes: Pt presented with weakness

## 2019-03-05 NOTE — ED Notes (Signed)
Pt's son, [name] updated on pt's condition per pt's request.

## 2019-03-05 NOTE — Progress Notes (Signed)
Pharmacy Antibiotic Note  [name] [name] is a 68 y.o. female admitted on 03/05/2019 with cellulitis.  Pharmacy has been consulted for vancomycin dosing.   Plan: Vancomycin 1500mg  IV x1, then 1000mg  IV every 24 hours (calc AUC 470, SCr 0.76) Monitor renal function, Cx and clinical progression to narrow Vancomycin levels at steady state  Height: 5' 1.5" (156.2 cm) Weight: 190 lb (86.2 kg) IBW/kg (Calculated) : 48.95  Temp (24hrs), Avg:102.3 F (39.1 C), Min:102.3 F (39.1 C), Max:102.3 F (39.1 C)  No results for input(s): WBC, CREATININE, LATICACIDVEN, VANCOTROUGH, VANCOPEAK, VANCORANDOM, GENTTROUGH, GENTPEAK, GENTRANDOM, TOBRATROUGH, TOBRAPEAK, TOBRARND, AMIKACINPEAK, AMIKACINTROU, AMIKACIN in the last 168 hours.  CrCl cannot be calculated (Patient's most recent lab result is older than the maximum 21 days allowed.).    Allergies  Allergen Reactions  . Aleve [Naproxen] Hives, Shortness Of Breath and Rash    Pt Avoids All Nsaids  . Protonix [Pantoprazole Sodium]     Makes her feel wreidd  . Zocor [Simvastatin] Other (See Comments)    "weird feeling all over body"    Antimicrobials this admission: Vanc 4/23>> Ceftriaxone x1  Dose adjustments this admission: n/a  Microbiology results: 4/23 BCx: sent 4/23 UCx: sent  Bertis Ruddy, PharmD Clinical Pharmacist Please check AMION for all [place] numbers 03/05/2019 6:06 PM

## 2019-03-05 NOTE — Progress Notes (Signed)
Patient is sepsis work-up, had Lactic acid pending, stated drawn at 2036 but now it did not resulted and order is gone.  I have paged MD for Lactic acid order to be put in.  I will keep monitor patient.

## 2019-03-05 NOTE — ED Notes (Signed)
Patient transported to CT 

## 2019-03-06 DIAGNOSIS — L8990 Pressure ulcer of unspecified site, unspecified stage: Secondary | ICD-10-CM

## 2019-03-06 LAB — CBC WITH DIFFERENTIAL/PLATELET
Abs Immature Granulocytes: 0.21 10*3/uL — ABNORMAL HIGH (ref 0.00–0.07)
Basophils Absolute: 0 10*3/uL (ref 0.0–0.1)
Basophils Relative: 0 %
Eosinophils Absolute: 0 10*3/uL (ref 0.0–0.5)
Eosinophils Relative: 0 %
HCT: 33.2 % — ABNORMAL LOW (ref 36.0–46.0)
Hemoglobin: 10.5 g/dL — ABNORMAL LOW (ref 12.0–15.0)
Immature Granulocytes: 2 %
Lymphocytes Relative: 7 %
Lymphs Abs: 0.8 10*3/uL (ref 0.7–4.0)
MCH: 28.9 pg (ref 26.0–34.0)
MCHC: 31.6 g/dL (ref 30.0–36.0)
MCV: 91.5 fL (ref 80.0–100.0)
Monocytes Absolute: 1 10*3/uL (ref 0.1–1.0)
Monocytes Relative: 8 %
Neutro Abs: 9.6 10*3/uL — ABNORMAL HIGH (ref 1.7–7.7)
Neutrophils Relative %: 83 %
Platelets: 213 10*3/uL (ref 150–400)
RBC: 3.63 MIL/uL — ABNORMAL LOW (ref 3.87–5.11)
RDW: 12.8 % (ref 11.5–15.5)
WBC: 11.6 10*3/uL — ABNORMAL HIGH (ref 4.0–10.5)
nRBC: 0 % (ref 0.0–0.2)

## 2019-03-06 LAB — URINE CULTURE

## 2019-03-06 LAB — HEMOGLOBIN A1C
Hgb A1c MFr Bld: 5.7 % — ABNORMAL HIGH (ref 4.8–5.6)
Mean Plasma Glucose: 116.89 mg/dL

## 2019-03-06 LAB — COMPREHENSIVE METABOLIC PANEL
ALT: 6 U/L (ref 0–44)
AST: 13 U/L — ABNORMAL LOW (ref 15–41)
Albumin: 2.7 g/dL — ABNORMAL LOW (ref 3.5–5.0)
Alkaline Phosphatase: 66 U/L (ref 38–126)
Anion gap: 9 (ref 5–15)
BUN: 11 mg/dL (ref 8–23)
CO2: 22 mmol/L (ref 22–32)
Calcium: 8.7 mg/dL — ABNORMAL LOW (ref 8.9–10.3)
Chloride: 111 mmol/L (ref 98–111)
Creatinine, Ser: 0.61 mg/dL (ref 0.44–1.00)
GFR calc Af Amer: >60 mL/min (ref >60)
GFR calc non Af Amer: >60 mL/min (ref >60)
Glucose, Bld: 127 mg/dL — ABNORMAL HIGH (ref 70–99)
Potassium: 3 mmol/L — ABNORMAL LOW (ref 3.5–5.1)
Sodium: 142 mmol/L (ref 135–145)
Total Bilirubin: 0.9 mg/dL (ref 0.3–1.2)
Total Protein: 6 g/dL — ABNORMAL LOW (ref 6.5–8.1)

## 2019-03-06 LAB — GLUCOSE, CAPILLARY
Glucose-Capillary: 119 mg/dL — ABNORMAL HIGH (ref 70–99)
Glucose-Capillary: 135 mg/dL — ABNORMAL HIGH (ref 70–99)
Glucose-Capillary: 120 mg/dL — ABNORMAL HIGH (ref 70–99)
Glucose-Capillary: 116 mg/dL — ABNORMAL HIGH (ref 70–99)

## 2019-03-06 LAB — LACTIC ACID, PLASMA: Lactic Acid, Venous: 1 mmol/L (ref 0.5–1.9)

## 2019-03-06 LAB — HIV ANTIBODY (ROUTINE TESTING W REFLEX): HIV Screen 4th Generation wRfx: NONREACTIVE

## 2019-03-06 LAB — MAGNESIUM: Magnesium: 1.4 mg/dL — ABNORMAL LOW (ref 1.7–2.4)

## 2019-03-06 MED ORDER — MAGNESIUM SULFATE 2 GM/50ML IV SOLN
2.0000 g | Freq: Once | INTRAVENOUS | Status: AC
  Administered 2019-03-06: 2 g via INTRAVENOUS
  Filled 2019-03-06: qty 50

## 2019-03-06 MED ORDER — SODIUM CHLORIDE 0.9 % IV SOLN
1.0000 g | INTRAVENOUS | Status: DC
  Administered 2019-03-06 – 2019-03-11 (×6): 1 g via INTRAVENOUS
  Filled 2019-03-06 (×8): qty 10

## 2019-03-06 MED ORDER — POTASSIUM CHLORIDE CRYS ER 20 MEQ PO TBCR
40.0000 meq | EXTENDED_RELEASE_TABLET | Freq: Once | ORAL | Status: AC
  Administered 2019-03-06: 40 meq via ORAL
  Filled 2019-03-06: qty 2

## 2019-03-06 MED ORDER — MUPIROCIN CALCIUM 2 % EX CREA
TOPICAL_CREAM | Freq: Two times a day (BID) | CUTANEOUS | Status: DC
  Administered 2019-03-06 – 2019-03-12 (×11): via TOPICAL
  Filled 2019-03-06: qty 15

## 2019-03-06 MED ORDER — LACTATED RINGERS IV SOLN
INTRAVENOUS | Status: AC
  Administered 2019-03-06 – 2019-03-07 (×2): via INTRAVENOUS

## 2019-03-06 NOTE — Consult Note (Signed)
[facility] Nurse wound consult note Reason for Consult:Full thickness infectious lesion to left buttock.  Present on admission with nonintact fluctuant center.  Some purulence noted.  Wound type:infectious Pressure Injury POA: NA Measurement:2 cmx 1 cm x 0.2 cm with surrounding erythema Wound bed: maroon discoloration with moist pink center.  Some purulence noted Drainage (amount, consistency, odor) minimal purulence noted.  Periwound:erythema and fluctuance noted.  Dressing procedure/placement/frequency:Cleanse left buttock wound with NS and pat dry.  Apply mupirocin ointment to wound bed. Cover with silicone foam dressing. Peel back and re apply every shift.  Change foam every three days and PRN soilage.  Will not follow at this time.  Please re-consult if needed.  Domenic Moras MSN, RN, FNP-BC CWON Wound, Ostomy, Continence Nurse Pager [phone]

## 2019-03-06 NOTE — Progress Notes (Signed)
PROGRESS NOTE    [name]  DGL:[id number] DOB: Jan 02, 1951 DOA: 03/05/2019 PCP: [name], MD   Brief Narrative:  [name] [name] is [name] [name] 68 y.o. female with medical history significant of depression, obstructive sleep apnea, diabetes mellitus type 2, essential hypertension, diastolic CHF, hyperlipidemia was brought to the hospital for evaluation of weakness for 2 days and sliding of her chair onto the floor.  She is also had some subjective fevers and chills with concerns for infection in her sacrum area of her pressure ulcer.  Patient states she has been feeling very weak and has remained pretty much bedbound over the last couple of days.  Typically she uses walker to get around.She gets home health who comes in daily.  In the ER patient was noted to have relative leukocytosis, febrile and tachycardia.  CT of the pelvis showed concerning for cellulitis extending into her left gluteus area with slight drainage.  She was started on IV Rocephin and vancomycin.  Medical team requested to admit the patient.  Patient denies any respiratory symptoms including shortness of breath, cough.  Denies any sick contacts including concerns for COVID.   Assessment & Plan:   Principal Problem:   Sepsis ([place]) Active Problems:   Diabetes type 2, uncontrolled (HCC)   Lumbar radiculopathy   OSA on CPAP   Cellulitis of buttock, left   Pressure injury of skin  Sepsis present prior to admission secondary to acute cellulitis of the left buttock area with mildly draining abscess.  - Continue IV vancomycin and ceftriaxone - CT without evidence of abscess.  "inflammation in the posteromedial L thigh extending to the gluteal fold and to the L inguinal intertriginous fold, more compatible with cellulitis than decubitus ulcer" - wound care consult  - Pain control, bowel regimen. - Antihypertensives on hold.  IV fluids.  Monitor urine output.  Generalized weakness -Secondary to underlying  infection - PT/OT eval  Diabetes mellitus type 2 - SSI - Follow A1c  History of Parkinson's disease -Continue Sinemet.  On Lexapro.  Essential hypertension -Olmesartan on hold  Obstructive sleep apnea -Supportive care.  DVT prophylaxis: heparin  Code Status: full  Family Communication: none at bedside, will call Disposition Plan: pending further improvement   Consultants:   none  Procedures:   none  Antimicrobials:  Anti-infectives (From admission, onward)   Start     Dose/Rate Route Frequency Ordered Stop   03/06/19 1900  vancomycin (VANCOCIN) IVPB 1000 mg/200 mL premix     1,000 mg 200 mL/hr over 60 Minutes Intravenous Every 24 hours 03/05/19 1918     03/06/19 1800  cefTRIAXone (ROCEPHIN) 1 g in sodium chloride 0.9 % 100 mL IVPB     1 g 200 mL/hr over 30 Minutes Intravenous Every 24 hours 03/06/19 1042     03/05/19 2145  vancomycin (VANCOCIN) IVPB 1000 mg/200 mL premix  Status:  Discontinued     1,000 mg 200 mL/hr over 60 Minutes Intravenous  Once 03/05/19 2144 03/05/19 2146   03/05/19 1815  vancomycin (VANCOCIN) IVPB 1000 mg/200 mL premix  Status:  Discontinued     1,000 mg 200 mL/hr over 60 Minutes Intravenous  Once 03/05/19 1802 03/05/19 1805   03/05/19 1815  cefTRIAXone (ROCEPHIN) 2 g in sodium chloride 0.9 % 100 mL IVPB     2 g 200 mL/hr over 30 Minutes Intravenous  Once 03/05/19 1802 03/05/19 1858   03/05/19 1815  vancomycin (VANCOCIN) 1,500 mg in sodium chloride 0.9 % 500 mL IVPB  1,500 mg 250 mL/hr over 120 Minutes Intravenous  Once 03/05/19 1805 03/05/19 2113      Subjective: Manus Weedman&Ox3 Pain is ok.  Objective: Vitals:   03/06/19 0404 03/06/19 0611 03/06/19 0806 03/06/19 1057  BP: (!) 115/50  95/88 (!) 111/59  Pulse: (!) 113  86 86  Resp: (!) 28  (!) 28 (!) 24  Temp: (!) 102.4 F (39.1 C) 99.4 F (37.4 C) 98.5 F (36.9 C) 97.8 F (36.6 C)  TempSrc: Oral Oral Oral Oral  SpO2: 93%  95% 94%  Weight: 89.8 kg     Height:         Intake/Output Summary (Last 24 hours) at 03/06/2019 1355 Last data filed at 03/06/2019 0930 Gross per 24 hour  Intake 3057 ml  Output 526 ml  Net 2531 ml   Filed Weights   03/05/19 1750 03/05/19 2145 03/06/19 0404  Weight: 86.2 kg 93.9 kg 89.8 kg    Examination:  General exam: Appears calm and comfortable  Respiratory system: Clear to auscultation. Respiratory effort normal. Cardiovascular system: S1 & S2 heard, RRR.  Gastrointestinal system: Abdomen is nondistended, soft and nontender Left decubitus ulcer with redness extending to groin Central nervous system: Alert and oriented. No focal neurological deficits. Extremities: moving all extremities Skin: No rashes, lesions or ulcers Psychiatry: Judgement and insight appear normal. Mood & affect appropriate.     Data Reviewed: I have personally reviewed following labs and imaging studies  CBC: Recent Labs  Lab 03/05/19 1810 03/06/19 0039  WBC 13.2* 11.6*  NEUTROABS 11.3* 9.6*  HGB 11.5* 10.5*  HCT 35.7* 33.2*  MCV 92.5 91.5  PLT 238 683   Basic Metabolic Panel: Recent Labs  Lab 03/05/19 1810 03/06/19 0039  NA 136 142  K 3.7 3.0*  CL 102 111  CO2 22 22  GLUCOSE 200* 127*  BUN 16 11  CREATININE 0.76 0.61  CALCIUM 9.7 8.7*  MG  --  1.4*   GFR: Estimated Creatinine Clearance: 70.3 mL/min (by C-G formula based on SCr of 0.61 mg/dL). Liver Function Tests: Recent Labs  Lab 03/05/19 1810 03/06/19 0039  AST 19 13*  ALT 18 6  ALKPHOS 75 66  BILITOT 0.6 0.9  PROT 6.7 6.0*  ALBUMIN 3.4* 2.7*   No results for input(s): LIPASE, AMYLASE in the last 168 hours. No results for input(s): AMMONIA in the last 168 hours. Coagulation Profile: No results for input(s): INR, PROTIME in the last 168 hours. Cardiac Enzymes: No results for input(s): CKTOTAL, CKMB, CKMBINDEX, TROPONINI in the last 168 hours. BNP (last 3 results) No results for input(s): PROBNP in the last 8760 hours. HbA1C: No results for input(s): HGBA1C  in the last 72 hours. CBG: Recent Labs  Lab 03/05/19 2300 03/06/19 0743 03/06/19 1054  GLUCAP 108* 119* 135*   Lipid Profile: No results for input(s): CHOL, HDL, LDLCALC, TRIG, CHOLHDL, LDLDIRECT in the last 72 hours. Thyroid Function Tests: No results for input(s): TSH, T4TOTAL, FREET4, T3FREE, THYROIDAB in the last 72 hours. Anemia Panel: No results for input(s): VITAMINB12, FOLATE, FERRITIN, TIBC, IRON, RETICCTPCT in the last 72 hours. Sepsis Labs: Recent Labs  Lab 03/05/19 1810 03/06/19 0039  LATICACIDVEN 2.4* 1.0    Recent Results (from the past 240 hour(s))  Blood Culture (routine x 2)     Status: None (Preliminary result)   Collection Time: 03/05/19  6:10 PM  Result Value Ref Range Status   Specimen Description BLOOD RIGHT FOREARM  Final   Special Requests   Final  BOTTLES DRAWN AEROBIC AND ANAEROBIC Blood Culture adequate volume   Culture   Final    NO GROWTH < 24 HOURS Performed at [facility], [place] [address], [place], [place] [address]    Report Status PENDING  Incomplete  Blood Culture (routine x 2)     Status: None (Preliminary result)   Collection Time: 03/05/19  6:39 PM  Result Value Ref Range Status   Specimen Description BLOOD LEFT HAND  Final   Special Requests   Final    BOTTLES DRAWN AEROBIC AND ANAEROBIC Blood Culture adequate volume   Culture   Final    NO GROWTH < 24 HOURS Performed at [facility], [place] [address], [place], [place] [address]    Report Status PENDING  Incomplete         Radiology Studies: Ct Pelvis W Contrast  Result Date: 03/05/2019 CLINICAL DATA:  68 year old female with Yaakov Saindon history of decubitus ulcer EXAM: CT PELVIS WITH CONTRAST TECHNIQUE: Multidetector CT imaging of the pelvis was performed using the standard protocol following the bolus administration of intravenous contrast. CONTRAST:  167mL OMNIPAQUE IOHEXOL 300 MG/ML  SOLN COMPARISON:  03/28/2018 FINDINGS: Urinary Tract: Visualized aspects of the  inferior kidneys demonstrate right-sided nonobstructive nephrolithiasis, and low-density cysts bilaterally. Urinary bladder partially distended. Unremarkable appearance of the ureters. Bowel: Visualized small bowel unremarkable. Normal appendix. Unremarkable colon with no inflammatory changes. Vascular/Lymphatic: Mild atherosclerosis of the abdominal aorta. Bilateral iliac arteries and proximal femoral arteries are patent. Inguinal lymph nodes are present, more numerous on the left, potentially reactive. Small lymph nodes of the left iliac nodal station. Reproductive:  Unremarkable uterus. Other: Inflammation/infiltration of the soft tissues of the proximal left thigh extending to the gluteal fold and into the left inguinal intertriginous fold. No focal fluid. No extension into the ischial rectal fossa or into the pelvis. Trace contralateral skin thickening of the right medial thigh/gluteal region. No abdominal hernia. Musculoskeletal: No displaced fracture. Degenerative changes of the lower lumbar spine including vacuum disc phenomenon spanning L3-S1. IMPRESSION: Inflammation in the posteromedial left thigh extending to the gluteal fold and to the left inguinal intertriginous fold, more compatible with cellulitis than decubitus ulcer. No evidence of abscess. Likely reactive left inguinal adenopathy. Ancillary findings as above. Electronically Signed   By: Corrie Mckusick D.O.   On: 03/05/2019 20:08   Dg Chest Port 1 View  Result Date: 03/05/2019 CLINICAL DATA:  Fever EXAM: PORTABLE CHEST 1 VIEW COMPARISON:  03/30/2018 FINDINGS: Heart and mediastinal contours are within normal limits. No focal opacities or effusions. No acute bony abnormality. IMPRESSION: No active cardiopulmonary disease. Electronically Signed   By: Rolm Baptise M.D.   On: 03/05/2019 19:07        Scheduled Meds: . aspirin  81 mg Oral Daily  . carbidopa-levodopa  2 tablet Oral TID  . cholecalciferol  5,000 Units Oral Daily  .  escitalopram  15 mg Oral Daily  . fenofibrate  160 mg Oral q morning - 10a  . heparin  5,000 Units Subcutaneous Q8H  . insulin aspart  0-15 Units Subcutaneous TID WC  . insulin aspart  0-5 Units Subcutaneous QHS  . mupirocin cream   Topical BID   Continuous Infusions: . sodium chloride 75 mL/hr at 03/06/19 0439  . cefTRIAXone (ROCEPHIN)  IV    . vancomycin       LOS: 1 day    Time spent: over 85 min    Fayrene Helper, MD [facility] Pager [email or web address]  If 7PM-7AM, please contact night-coverage [email or web address] Password [email or web address] 03/06/2019, 1:55 PM

## 2019-03-06 NOTE — Evaluation (Signed)
Clinical/Bedside Swallow Evaluation Patient Details  Name: [name] [name] MRN: [id number] Date of Birth: 03/16/1951  Today's Date: 03/06/2019 Time: SLP Start Time (ACUTE ONLY): 0900 SLP Stop Time (ACUTE ONLY): 0921 SLP Time Calculation (min) (ACUTE ONLY): 21 min  Past Medical History:  Past Medical History:  Diagnosis Date  . Acute kidney failure (HCC)    hx of   . Arthritis    fingers,right shoulder  . Bradycardia   . 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 CHF (congestive heart failure) (HCC)    acute diastolic ( congestive ) heart failure)   . Chronic lumbar radiculopathy    L5- S1  right leg  . Constipation   . Depression   . Diverticulosis large intestine w/o perforation or abscess w/bleeding   . Dizziness   . Dry eye syndrome of bilateral lacrimal glands   . Frequency of urination   . GERD (gastroesophageal reflux disease)   . Hard of hearing   . History of kidney stones 05/12/15   surgery   . HTN (hypertension)   . Hyperlipidemia   . Hypokalemia   . Morbid obesity due to excess calories ([place])   . 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 Numbness and tingling in hands   . OSA (obstructive sleep apnea)    moderate OSA per study 11-22-2007--  refused CPAP but used oxygen for 6 months at night,  states due to wt loss stopped using oxygen  . Polyneuropathy, diabetic ([place])    WALKS W/ CANE FOR BALANCE  . 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 Pyonephrosis   . Pyonephrosis   . Radiculopathy of lumbar region   . Respiratory failure (HCC)    hx of acute respiratory failure wtih hypoxia   . Right ureteral stone   . Severe sepsis with septic shock (CODE) ([place])   . Shortness of breath dyspnea   . Spinal stenosis, cervical region   . SUI (stress urinary incontinence, female)   . Tubulo-interstitial nephritis   . Type 2 diabetes mellitus (HCC)    Type  II - Diet controlled  . Urinary incontinence   . Urinary tract infection   . Wears glasses    Past Surgical History:  Past Surgical History:  Procedure Laterality Date  . ANTERIOR CERVICAL DECOMP/DISCECTOMY FUSION N/A 03/23/2016   Procedure: Cervical Four-Five, Cervical Five-Six, Cervical Six-Seven Anterior cervical decompression/diskectomy/fusion;  Surgeon: Leeroy Cha, MD;  Location: [facility];  Service: Neurosurgery;  Laterality: N/A;  C4-5 C5-6 C6-7 Anterior cervical decompression/diskectomy/fusion  . CARDIOVASCULAR STRESS TEST  09-30-2007     normal Adenosine study/  no ischemia /  normal LV function and wall motion , ef 82%  . COLONOSCOPY    . CYSTOSCOPY WITH RETROGRADE PYELOGRAM, URETEROSCOPY AND STENT PLACEMENT Right 05/12/2015   Procedure: CYSTOSCOPY WITH RETROGRADE PYELOGRAM, URETEROSCOPY,STONE EXTRACTION AND STENT PLACEMENT;  Surgeon: Franchot Gallo, MD;  Location[facility];  Service: Urology;  Laterality: Right;  . CYSTOSCOPY WITH RETROGRADE PYELOGRAM, URETEROSCOPY AND STENT PLACEMENT Right 04/25/2018   Procedure: CYSTOSCOPY WITH RETROGRADE PYELOGRAM, URETEROSCOPY AND STENT EXCHANGE;  Surgeon: Alexis Frock, MD;  Location: [facility];  Service: Urology;  Laterality: Right;  . CYSTOSCOPY WITH STENT PLACEMENT Right 03/28/2018   Procedure: CYSTOSCOPY WITH STENT PLACEMENT retrograde pylegram;  Surgeon: Alexis Frock, MD;  Location: [facility];  Service: Urology;  Laterality:  Right;  Marland Kitchen EXTRACORPOREAL SHOCK WAVE LITHOTRIPSY Right 08-18-2012  . HOLMIUM LASER APPLICATION Right 4/69/6295   Procedure: HOLMIUM LASER APPLICATION;  Surgeon: Franchot Gallo, MD;  Location[facility];  Service: Urology;  Laterality: Right;  . HOLMIUM LASER APPLICATION Right 2/84/1324   Procedure: HOLMIUM LASER APPLICATION;  Surgeon: Alexis Frock, MD;  Location: [facility];  Service: Urology;  Laterality: Right;  . ORIF HUMERUS FRACTURE Right 07/20/2016   Procedure: OPEN  REDUCTION INTERNAL FIXATION (ORIF) PROXIMAL HUMERUS FRACTURE VS REVERSE TOTAL SHOULDER ARTHROPLASTY;  Surgeon: Netta Cedars, MD;  Location: [facility];  Service: Orthopedics;  Laterality: Right;  . SHOULDER ARTHROSCOPY Right 07/2016  . TRANSTHORACIC ECHOCARDIOGRAM  09-23-2007   pseudonormal LV filling pattern,  ef 65-70%/  trivial AR/  mild LAE  . TUBAL LIGATION  1985   HPI:  Pt is a 68 yo female admitted with sepsis secondary to acute cellulitis of the L buttock area. CXR on admission without acute infection. Pt has had two previous MBS with most recent this year, with no aspiration observed on either study. She followed with OP SLP for utilization of swallow precautions and dysarthria. PMH includes: DMII, PSP, HTN, OSA, GERD, CHF   Assessment / Plan / Recommendation Clinical Impression  Pt with a h/o mild dysphagia has quite prolonged mastication of solid foods on her breakfast tray. She describes avoiding certain regular solids at home due to difficulty chewing, and likes for foods to be cut into more bite-sized pieces. She subjectively says that she has been getting "choked" more with PO intake, but her CXR is clear and there is no evidence of this during skilled observation this morning. Previously documented SLP visits describe coughing and throat clearing in the absence of airway compromise on instrumental testing. Recommend adjusting diet to Dys 3 solids, continuing thin liquids. SLP will f/u for tolerance particularly in light of subjective concerns. SLP Visit Diagnosis: Dysphagia, oropharyngeal phase (R13.12)    Aspiration Risk  Mild aspiration risk    Diet Recommendation Dysphagia 3 (Mech soft);Thin liquid   Liquid Administration via: Cup;Straw Medication Administration: Whole meds with liquid(one at a time, or whole in puree) Supervision: Patient able to self feed;Intermittent supervision to cue for compensatory strategies;Comment(set-up assist) Compensations: Slow rate;Small  sips/bites Postural Changes: Seated upright at 90 degrees;Remain upright for at least 30 minutes after po intake    Other  Recommendations Oral Care Recommendations: Oral care BID   Follow up Recommendations None      Frequency and Duration min 2x/week  1 week       Prognosis Prognosis for Safe Diet Advancement: (baseline diet)      Swallow Study   General HPI: Pt is a 68 yo female admitted with sepsis secondary to acute cellulitis of the L buttock area. CXR on admission without acute infection. Pt has had two previous MBS with most recent this year, with no aspiration observed on either study. She followed with OP SLP for utilization of swallow precautions and dysarthria. PMH includes: DMII, PSP, HTN, OSA, GERD, CHF Type of Study: Bedside Swallow Evaluation Previous Swallow Assessment: see HPI Diet Prior to this Study: Regular;Thin liquids Temperature Spikes Noted: Yes(103) Respiratory Status: Room air History of Recent Intubation: No Behavior/Cognition: Alert;Cooperative;Pleasant mood Oral Care Completed by SLP: No Vision: Functional for self-feeding Self-Feeding Abilities: Able to feed self;Needs set up Patient Positioning: Upright in bed Baseline Vocal Quality: Normal(but speech dysarthric)    Oral/Motor/Sensory Function     Ice Chips Ice chips: Not tested  Thin Liquid Thin Liquid: Not tested Presentation: Cup;Self Fed    Nectar Thick Nectar Thick Liquid: Not tested   Honey Thick Honey Thick Liquid: Not tested   Puree Puree: Not tested   Solid     Solid: Impaired Presentation: Self Fed;Spoon Oral Phase Functional Implications: Impaired mastication      Venita Sheffield Fabio Wah 03/06/2019,9:39 AM  [name], M.A. [place] Acute Environmental education officer [phone] Office [phone]

## 2019-03-07 LAB — CBC
HCT: 30.1 % — ABNORMAL LOW (ref 36.0–46.0)
Hemoglobin: 9.8 g/dL — ABNORMAL LOW (ref 12.0–15.0)
MCH: 29.8 pg (ref 26.0–34.0)
MCHC: 32.6 g/dL (ref 30.0–36.0)
MCV: 91.5 fL (ref 80.0–100.0)
Platelets: 218 10*3/uL (ref 150–400)
RBC: 3.29 MIL/uL — ABNORMAL LOW (ref 3.87–5.11)
RDW: 13.2 % (ref 11.5–15.5)
WBC: 15.5 10*3/uL — ABNORMAL HIGH (ref 4.0–10.5)
nRBC: 0 % (ref 0.0–0.2)

## 2019-03-07 LAB — BASIC METABOLIC PANEL
Anion gap: 11 (ref 5–15)
BUN: 18 mg/dL (ref 8–23)
CO2: 19 mmol/L — ABNORMAL LOW (ref 22–32)
Calcium: 8.9 mg/dL (ref 8.9–10.3)
Chloride: 110 mmol/L (ref 98–111)
Creatinine, Ser: 0.7 mg/dL (ref 0.44–1.00)
GFR calc Af Amer: >60 mL/min (ref >60)
GFR calc non Af Amer: >60 mL/min (ref >60)
Glucose, Bld: 115 mg/dL — ABNORMAL HIGH (ref 70–99)
Potassium: 3.6 mmol/L (ref 3.5–5.1)
Sodium: 140 mmol/L (ref 135–145)

## 2019-03-07 LAB — MAGNESIUM: Magnesium: 1.7 mg/dL (ref 1.7–2.4)

## 2019-03-07 LAB — GLUCOSE, CAPILLARY
Glucose-Capillary: 98 mg/dL (ref 70–99)
Glucose-Capillary: 119 mg/dL — ABNORMAL HIGH (ref 70–99)
Glucose-Capillary: 119 mg/dL — ABNORMAL HIGH (ref 70–99)
Glucose-Capillary: 123 mg/dL — ABNORMAL HIGH (ref 70–99)

## 2019-03-07 MED ORDER — SODIUM CHLORIDE 0.9% FLUSH: 10.0000 mL | INTRAVENOUS | Status: DC | PRN

## 2019-03-07 MED ORDER — NYSTATIN 100000 UNIT/GM EX POWD
Freq: Three times a day (TID) | CUTANEOUS | Status: DC
  Administered 2019-03-07 – 2019-03-12 (×13): via TOPICAL
  Filled 2019-03-07: qty 15

## 2019-03-07 NOTE — Progress Notes (Signed)
PROGRESS NOTE    [name] [name]  FIE:[id number] DOB: 1951-03-27 DOA: 03/05/2019 PCP: [name] Bien, MD   Brief Narrative:  Per HPI [name] [name] is [name] [name] 68 y.o. female with medical history significant of depression, obstructive sleep apnea, diabetes mellitus type 2, essential hypertension, diastolic CHF, hyperlipidemia was brought to the hospital for evaluation of weakness for 2 days and sliding of her chair onto the floor.  She is also had some subjective fevers and chills with concerns for infection in her sacrum area of her pressure ulcer.  Patient states she has been feeling very weak and has remained pretty much bedbound over the last couple of days.  Typically she uses walker to get around.She gets home health who comes in daily.  In the ER patient was noted to have relative leukocytosis, febrile and tachycardia.  CT of the pelvis showed concerning for cellulitis extending into her left gluteus area with slight drainage.  She was started on IV Rocephin and vancomycin.  Medical team requested to admit the patient.  Patient denies any respiratory symptoms including shortness of breath, cough.  Denies any sick contacts including concerns for COVID.  Admitted for cellulitis with decubitus ulcer.  Improving on antibiotics.  Transfer to telemetry.   Assessment & Plan:   Principal Problem:   Sepsis ([place]) Active Problems:   Diabetes type 2, uncontrolled (HCC)   Lumbar radiculopathy   OSA on CPAP   Cellulitis of buttock, left   Pressure injury of skin  Sepsis present prior to admission secondary to acute cellulitis of the left buttock area with mildly draining abscess.  - Continue IV vancomycin and ceftriaxone - Follow MRSA PCR - CT without evidence of abscess.  "inflammation in the posteromedial L thigh extending to the gluteal fold and to the L inguinal intertriginous fold, more compatible with cellulitis than decubitus ulcer" - wound care consult, appreciate recs - Pain  control, bowel regimen. - Appears to have candidal intertrigo as well, will start nystatin - Worsening leukocytosis, continue to monitor.  She's afebrile.  If not continuing to improve, consider reimaging or [place].    Generalized weakness -Secondary to underlying infection - PT/OT eval  Diabetes mellitus type 2 - SSI - Follow A1c (5.7)  History of Parkinson's disease -Continue Sinemet.  On Lexapro.  Essential hypertension -Olmesartan on hold  Obstructive sleep apnea -Supportive care.  DVT prophylaxis: heparin  Code Status: full  Family Communication: none at bedside, will call Disposition Plan: pending further improvement   Consultants:   none  Procedures:   none  Antimicrobials:  Anti-infectives (From admission, onward)   Start     Dose/Rate Route Frequency Ordered Stop   03/06/19 1900  vancomycin (VANCOCIN) IVPB 1000 mg/200 mL premix     1,000 mg 200 mL/hr over 60 Minutes Intravenous Every 24 hours 03/05/19 1918     03/06/19 1800  cefTRIAXone (ROCEPHIN) 1 g in sodium chloride 0.9 % 100 mL IVPB     1 g 200 mL/hr over 30 Minutes Intravenous Every 24 hours 03/06/19 1042     03/05/19 2145  vancomycin (VANCOCIN) IVPB 1000 mg/200 mL premix  Status:  Discontinued     1,000 mg 200 mL/hr over 60 Minutes Intravenous  Once 03/05/19 2144 03/05/19 2146   03/05/19 1815  vancomycin (VANCOCIN) IVPB 1000 mg/200 mL premix  Status:  Discontinued     1,000 mg 200 mL/hr over 60 Minutes Intravenous  Once 03/05/19 1802 03/05/19 1805   03/05/19 1815  cefTRIAXone (ROCEPHIN) 2  g in sodium chloride 0.9 % 100 mL IVPB     2 g 200 mL/hr over 30 Minutes Intravenous  Once 03/05/19 1802 03/05/19 1858   03/05/19 1815  vancomycin (VANCOCIN) 1,500 mg in sodium chloride 0.9 % 500 mL IVPB     1,500 mg 250 mL/hr over 120 Minutes Intravenous  Once 03/05/19 1805 03/05/19 2113      Subjective: Feels ok.  No complaints today.  Objective: Vitals:   03/07/19 0900 03/07/19 0949 03/07/19 1009  03/07/19 1303  BP:   (!) 104/47 (!) 113/53  Pulse: 90 86 84 83  Resp: 19 20 (!) 21 (!) 28  Temp:   97.8 F (36.6 C) (!) 97.4 F (36.3 C)  TempSrc:   Oral Oral  SpO2: 96% 98% 96% 99%  Weight:      Height:        Intake/Output Summary (Last 24 hours) at 03/07/2019 1616 Last data filed at 03/07/2019 1212 Gross per 24 hour  Intake 1070 ml  Output 200 ml  Net 870 ml   Filed Weights   03/05/19 2145 03/06/19 0404 03/07/19 0430  Weight: 93.9 kg 89.8 kg 92.3 kg    Examination:  General: No acute distress. Cardiovascular: Heart sounds show [name] [name] regular rate, and rhythm Lungs: Clear to auscultation bilaterally Abdomen: Soft, nontender, nondistended  Neurological: Alert and oriented 3. Moves all extremities 4 with equal strength. Cranial nerves II through XII grossly intact. Skin: L buttocks with stage III decubitus ulcer with surrounding erythema.  L groin with erythema and evidence of candida to intertriginous region. Psychiatric: Mood and affect are normal. Insight and judgment are appropriate.     Data Reviewed: I have personally reviewed following labs and imaging studies  CBC: Recent Labs  Lab 03/05/19 1810 03/06/19 0039 03/07/19 0422  WBC 13.2* 11.6* 15.5*  NEUTROABS 11.3* 9.6*  --   HGB 11.5* 10.5* 9.8*  HCT 35.7* 33.2* 30.1*  MCV 92.5 91.5 91.5  PLT 238 213 546   Basic Metabolic Panel: Recent Labs  Lab 03/05/19 1810 03/06/19 0039 03/07/19 0422  NA 136 142 140  K 3.7 3.0* 3.6  CL 102 111 110  CO2 22 22 19*  GLUCOSE 200* 127* 115*  BUN 16 11 18   CREATININE 0.76 0.61 0.70  CALCIUM 9.7 8.7* 8.9  MG  --  1.4* 1.7   GFR: Estimated Creatinine Clearance: 71.4 mL/min (by C-G formula based on SCr of 0.7 mg/dL). Liver Function Tests: Recent Labs  Lab 03/05/19 1810 03/06/19 0039  AST 19 13*  ALT 18 6  ALKPHOS 75 66  BILITOT 0.6 0.9  PROT 6.7 6.0*  ALBUMIN 3.4* 2.7*   No results for input(s): LIPASE, AMYLASE in the last 168 hours. No results for  input(s): AMMONIA in the last 168 hours. Coagulation Profile: No results for input(s): INR, PROTIME in the last 168 hours. Cardiac Enzymes: No results for input(s): CKTOTAL, CKMB, CKMBINDEX, TROPONINI in the last 168 hours. BNP (last 3 results) No results for input(s): PROBNP in the last 8760 hours. HbA1C: Recent Labs    03/06/19 0039  HGBA1C 5.7*   CBG: Recent Labs  Lab 03/06/19 1643 03/06/19 2037 03/07/19 0745 03/07/19 1228 03/07/19 1547  GLUCAP 120* 116* 98 119* 119*   Lipid Profile: No results for input(s): CHOL, HDL, LDLCALC, TRIG, CHOLHDL, LDLDIRECT in the last 72 hours. Thyroid Function Tests: No results for input(s): TSH, T4TOTAL, FREET4, T3FREE, THYROIDAB in the last 72 hours. Anemia Panel: No results for input(s): VITAMINB12, FOLATE, FERRITIN,  TIBC, IRON, RETICCTPCT in the last 72 hours. Sepsis Labs: Recent Labs  Lab 03/05/19 1810 03/06/19 0039  LATICACIDVEN 2.4* 1.0    Recent Results (from the past 240 hour(s))  Blood Culture (routine x 2)     Status: None (Preliminary result)   Collection Time: 03/05/19  6:10 PM  Result Value Ref Range Status   Specimen Description BLOOD RIGHT FOREARM  Final   Special Requests   Final    BOTTLES DRAWN AEROBIC AND ANAEROBIC Blood Culture adequate volume   Culture   Final    NO GROWTH 2 DAYS Performed at [facility], [address] [place], [place] [address]    Report Status PENDING  Incomplete  Blood Culture (routine x 2)     Status: None (Preliminary result)   Collection Time: 03/05/19  6:39 PM  Result Value Ref Range Status   Specimen Description BLOOD LEFT HAND  Final   Special Requests   Final    BOTTLES DRAWN AEROBIC AND ANAEROBIC Blood Culture adequate volume   Culture   Final    NO GROWTH 2 DAYS Performed at [facility], [place] [address] [place], [place] [address]    Report Status PENDING  Incomplete  Urine culture     Status: Abnormal   Collection Time: 03/05/19  8:30 PM  Result Value Ref  Range Status   Specimen Description URINE, CLEAN CATCH  Final   Special Requests   Final    NONE Performed at [facility], [place] [address], [place], [place] [address]    Culture MULTIPLE SPECIES PRESENT, SUGGEST RECOLLECTION (Zeev Deakins)  Final   Report Status 03/06/2019 FINAL  Final         Radiology Studies: Ct Pelvis W Contrast  Result Date: 03/05/2019 CLINICAL DATA:  68 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 CONTRAST:  159mL OMNIPAQUE IOHEXOL 300 MG/ML  SOLN COMPARISON:  03/28/2018 FINDINGS: Urinary Tract: Visualized aspects of the inferior kidneys demonstrate right-sided nonobstructive nephrolithiasis, and low-density cysts bilaterally. Urinary bladder partially distended. Unremarkable appearance of the ureters. Bowel: Visualized small bowel unremarkable. Normal appendix. Unremarkable colon with no inflammatory changes. Vascular/Lymphatic: Mild atherosclerosis of the abdominal aorta. Bilateral iliac arteries and proximal femoral arteries are patent. Inguinal lymph nodes are present, more numerous on the left, potentially reactive. Small lymph nodes of the left iliac nodal station. Reproductive:  Unremarkable uterus. Other: Inflammation/infiltration of the soft tissues of the proximal left thigh extending to the gluteal fold and into the left inguinal intertriginous fold. No focal fluid. No extension into the ischial rectal fossa or into the pelvis. Trace contralateral skin thickening of the right medial thigh/gluteal region. No abdominal hernia. Musculoskeletal: No displaced fracture. Degenerative changes of the lower lumbar spine including vacuum disc phenomenon spanning L3-S1. IMPRESSION: Inflammation in the posteromedial left thigh extending to the gluteal fold and to the left inguinal intertriginous  fold, more compatible with cellulitis than decubitus ulcer. No evidence of abscess. Likely reactive left inguinal adenopathy. Ancillary findings as above. Electronically Signed   By: Corrie Mckusick D.O.   On: 03/05/2019 20:08   Dg Chest Port 1 View  Result Date: 03/05/2019 CLINICAL DATA:  Fever EXAM: PORTABLE CHEST 1 VIEW COMPARISON:  03/30/2018 FINDINGS: Heart and mediastinal contours are within normal limits. No focal opacities or effusions. No acute bony abnormality. IMPRESSION: No active  cardiopulmonary disease. Electronically Signed   By: Rolm Baptise M.D.   On: 03/05/2019 19:07        Scheduled Meds:  aspirin  81 mg Oral Daily   carbidopa-levodopa  2 tablet Oral TID   cholecalciferol  5,000 Units Oral Daily   escitalopram  15 mg Oral Daily   fenofibrate  160 mg Oral q morning - 10a   heparin  5,000 Units Subcutaneous Q8H   insulin aspart  0-15 Units Subcutaneous TID WC   insulin aspart  0-5 Units Subcutaneous QHS   mupirocin cream   Topical BID   Continuous Infusions:  cefTRIAXone (ROCEPHIN)  IV 1 g (03/06/19 1734)   vancomycin Stopped (03/06/19 2130)     LOS: 2 days    Time spent: over 30 min    Fayrene Helper, MD [facility] Pager AMION  If 7PM-7AM, please contact night-coverage [email or web address] Password [facility] 03/07/2019, 4:16 PM

## 2019-03-07 NOTE — Evaluation (Addendum)
Physical Therapy Evaluation Patient Details Name: [name] [name] MRN: [id number] DOB: December 26, 1950 Today's Date: 03/07/2019   History of Present Illness  Pt is a 68 y.o. female admitted from [place] 03/05/19 with progressive decline and weakness over past several days. Worked up for sepsis secondary to acute cellulitis of L buttock wound. CXR without acute infection. PMH includes Parksinson's, progressive supranuclear palsy, DM2, HTN, OSA, CHF.     Clinical Impression  Pt presents with an overall decrease in functional mobility secondary to above. PTA, pt lives alone with 24/7 aide support; pt requries assist for transfers, w/c mobility and ADLs. Today, pt requiring maxA+2 for bed mobility, able to sit EOB with poor to fair balance. Pt would benefit from continued acute PT services to maximize functional mobility and independence prior to d/c with HHPT services.     Follow Up Recommendations Home health PT;Supervision for mobility/OOB    Equipment Recommendations  None recommended by PT    Recommendations for Other Services       Precautions / Restrictions Precautions Precautions: Fall Precaution Comments: Sacral and perineal wounds Restrictions Weight Bearing Restrictions: No      Mobility  Bed Mobility Overal bed mobility: Needs Assistance Bed Mobility: Supine to Sit;Sit to Supine Rolling: Max assist;+2 for physical assistance;+2 for safety/equipment   Supine to sit: Max assist;+2 for physical assistance;HOB elevated Sit to supine: Max assist;+2 for physical assistance   General bed mobility comments: maxA +2 for bed mobility scooting hips forward, elevating trunk and movement of BLEs  Transfers Overall transfer level: Needs assistance   Transfers: Lateral/Scoot Transfers          Lateral/Scoot Transfers: Max assist;+2 physical assistance General transfer comment: MaxA+2 to assist lateral scoot transfer towards HOB, pt assisting minimally with BUE/BLE  support  Ambulation/Gait                Stairs            Wheelchair Mobility    Modified Rankin (Stroke Patients Only)       Balance Overall balance assessment: Needs assistance   Sitting balance-Leahy Scale: Poor Sitting balance - Comments: Able to maintain sitting balance with intermittent min guard, easily fatigued requiring min-maxA                                     Pertinent Vitals/Pain Pain Assessment: Faces Faces Pain Scale: Hurts little more Pain Location: buttock Pain Descriptors / Indicators: Discomfort;Grimacing Pain Intervention(s): Limited activity within patient's tolerance;Repositioned    Home Living Family/patient expects to be discharged to:: Private residence(From [facility] ILF) Living Arrangements: Alone Available Help at Discharge: Personal care attendant;Friend(s);Available 24 hours/day Type of Home: Apartment Home Access: Level entry     Home Layout: One level Home Equipment: Shower seat;Grab bars - tub/shower;Wheelchair - power;Wheelchair - manual Additional Comments: Has aide support 24/7    Prior Function Level of Independence: Needs assistance   Gait / Transfers Assistance Needed: Reports sitting in manual w/c or lift chair "95% of the day, the other 5% is spent going to/from bathroom." Requires assist for pivot transfer to/from wheelchair  ADL's / Homemaking Assistance Needed: Aide assists with bathing/dressing        Hand Dominance   Dominant Hand: Left    Extremity/Trunk Assessment   Upper Extremity Assessment Upper Extremity Assessment: Generalized weakness;RUE deficits/detail;LUE deficits/detail RUE Deficits / Details: WFLs for AROM RUE Coordination: decreased fine motor;decreased gross  motor LUE Deficits / Details: WFLs for AROM LUE Coordination: decreased fine motor;decreased gross motor    Lower Extremity Assessment Lower Extremity Assessment: Generalized weakness    Cervical /  Trunk Assessment Cervical / Trunk Assessment: Kyphotic  Communication   Communication: No difficulties  Cognition Arousal/Alertness: Awake/alert Behavior During Therapy: WFL for tasks assessed/performed Overall Cognitive Status: Within Functional Limits for tasks assessed                                 General Comments: Good insight into progressive condition      General Comments General comments (skin integrity, edema, etc.): HR ~100, SpO2 >90% on RA    Exercises     Assessment/Plan    PT Assessment Patient needs continued PT services  PT Problem List Decreased strength;Decreased activity tolerance;Decreased balance;Decreased mobility;Decreased coordination;Decreased skin integrity       PT Treatment Interventions DME instruction;Functional mobility training;Therapeutic activities;Therapeutic exercise;Balance training;Patient/family education;Wheelchair mobility training    PT Goals (Current goals can be found in the Care Plan section)  Acute Rehab PT Goals Patient Stated Goal: to go home PT Goal Formulation: With patient Time For Goal Achievement: 03/21/19 Potential to Achieve Goals: Good    Frequency Min 3X/week   Barriers to discharge        Co-evaluation               AM-PAC PT "6 Clicks" Mobility  Outcome Measure Help needed turning from your back to your side while in a flat bed without using bedrails?: A Lot Help needed moving from lying on your back to sitting on the side of a flat bed without using bedrails?: A Lot Help needed moving to and from a bed to a chair (including a wheelchair)?: Total Help needed standing up from a chair using your arms (e.g., wheelchair or bedside chair)?: Total Help needed to walk in hospital room?: Total Help needed climbing 3-5 steps with a railing? : Total 6 Click Score: 8    End of Session   Activity Tolerance: Patient tolerated treatment well;Patient limited by fatigue Patient left: in bed;with  call bell/phone within reach;with bed alarm set Nurse Communication: Mobility status;Need for lift equipment PT Visit Diagnosis: Other abnormalities of gait and mobility (R26.89)    Time: 5400-8676 PT Time Calculation (min) (ACUTE ONLY): 26 min   Charges:   PT Evaluation $PT Eval Moderate Complexity: [place], PT, DPT Acute Rehabilitation Services  Pager [phone] Office [place] 03/07/2019, 3:50 PM

## 2019-03-07 NOTE — Progress Notes (Signed)
Occupational Therapy Treatment Patient Details Name: [name] [name] MRN: [id number] DOB: December 14, 1950 Today's Date: 03/07/2019    History of present illness Pt is a 68 yo female s/p progressive decline over last several days with cellulitis in L gluteal and abcess at perineal region, tachycardia and recent episode of sliding from chair to floor due to weakness. Pmhx: speep panea, HTN, dCHF, Parkinson's , DMT2.   OT comments  Pt PTA: living in ILF with 24/7 caregivers. Pt was totalA for transfers, slept in lift chair and has not ambulated >3 months. Pt uses manual w/c for mobility and bathroom is handicapped accessible. Pt currently limited by incoordination of movements in BUEs and BLEs. Pt with L sided facial droop and decreased vision in L eye. Pt maxA+2 for bed mobility with  Intermittent assist for sitting balance. Poor to fair sitting balance exhibited. Pt would benefit from BUE exercises and EOB training for ADL. Pt with progressive parkinson's and pt with good insight into her diagnosis. Pt would benefit from [place]. OT to follow acutely.     Follow Up Recommendations  Home health OT;Supervision/Assistance - 24 hour    Equipment Recommendations  None recommended by OT    Recommendations for Other Services      Precautions / Restrictions Precautions Precautions: Fall Restrictions Weight Bearing Restrictions: No       Mobility Bed Mobility Overal bed mobility: Needs Assistance Bed Mobility: Rolling;Supine to Sit Rolling: Max assist;+2 for physical assistance;+2 for safety/equipment   Supine to sit: Max assist;+2 for physical assistance;+2 for safety/equipment;HOB elevated     General bed mobility comments: maxA +2 for bed mobility scooting hips forward, elevating trunk and movement of BLEs  Transfers Overall transfer level: Needs assistance               General transfer comment: dependent at this time and at baseline CG assist from lift chair, w/c and  commode    Balance Overall balance assessment: Needs assistance   Sitting balance-Leahy Scale: Poor Sitting balance - Comments: overall could sit EOB intermittently without assist, but still requires assist to sit upright to perform tasks and tires easily requiring support.                                   ADL either performed or assessed with clinical judgement   ADL Overall ADL's : Needs assistance/impaired Eating/Feeding: Set up;Sitting   Grooming: Minimal assistance;Sitting   Upper Body Bathing: Moderate assistance;Sitting   Lower Body Bathing: Maximal assistance;Sitting/lateral leans;Sit to/from stand   Upper Body Dressing : Moderate assistance;Sitting   Lower Body Dressing: Maximal assistance;Sitting/lateral leans;Sit to/from stand   Toilet Transfer: Total assistance;+2 for physical assistance;+2 for safety/equipment   Toileting- Clothing Manipulation and Hygiene: Total assistance;Sitting/lateral lean       Functional mobility during ADLs: (bed mobility only. pt dependent for squat pivot) General ADL Comments: pt performing UB ADL from minA and Max to [place] for LB      Vision Baseline Vision/History: (L eye becoming blind) Vision Assessment?: No apparent visual deficits   Perception     Praxis      Cognition Arousal/Alertness: Awake/alert Behavior During Therapy: WFL for tasks assessed/performed Overall Cognitive Status: Within Functional Limits for tasks assessed  Exercises     Shoulder Instructions       General Comments HR ~100 BPM and O2 >90% on RA    Pertinent Vitals/ Pain       Pain Assessment: Faces Faces Pain Scale: Hurts little more Pain Location: buttock Pain Descriptors / Indicators: Discomfort Pain Intervention(s): Limited activity within patient's tolerance  Home Living Family/patient expects to be discharged to:: Private residence Living Arrangements:  Alone Available Help at Discharge: Personal care attendant;Friend(s);Available 24 hours/day Type of Home: Apartment Home Access: Level entry     Home Layout: One level     Bathroom Shower/Tub: Occupational psychologist: Handicapped height     Home Equipment: Shower seat;Grab bars - tub/shower;Wheelchair - power;Wheelchair - manual   Additional Comments: Has aide support 24/7      Prior Functioning/Environment Level of Independence: Needs assistance  Gait / Transfers Assistance Needed: Reports sitting in manual w/c or lift chair "95% of the day, the other 5% is spent going to/from bathroom." Requires assist for pivot transfer to/from wheelchair ADL's / Homemaking Assistance Needed: Aide assists with bathing/dressing       Frequency  Min 2X/week        Progress Toward Goals  OT Goals(current goals can now be found in the care plan section)     Acute Rehab OT Goals Patient Stated Goal: to go home OT Goal Formulation: With patient Time For Goal Achievement: 03/21/19 Potential to Achieve Goals: Fair ADL Goals Pt Will Perform Grooming: with modified independence;sitting Additional ADL Goal #1: Pt will establisg dynamic sitting balance x10 mins at EOB with fair balance. Additional ADL Goal #2: Pt will perform BUE exercises with fair coordination to increase awareness of coordination deficits.  Plan      Co-evaluation                 AM-PAC OT "6 Clicks" Daily Activity     Outcome Measure   Help from another person eating meals?: None Help from another person taking care of personal grooming?: A Little Help from another person toileting, which includes using toliet, bedpan, or urinal?: Total Help from another person bathing (including washing, rinsing, drying)?: Total Help from another person to put on and taking off regular upper body clothing?: A Lot Help from another person to put on and taking off regular lower body clothing?: Total 6 Click  Score: 12    End of Session    OT Visit Diagnosis: Unsteadiness on feet (R26.81);Muscle weakness (generalized) (M62.81)   Activity Tolerance Treatment limited secondary to medical complications (Comment);Patient tolerated treatment well   Patient Left in bed;with call bell/phone within reach;with bed alarm set   Nurse Communication Mobility status;Other (comment)(abcess leaking)        Time: 8921-1941 OT Time Calculation (min): 33 min  Charges: OT General Charges $OT Visit: 1 Visit OT Evaluation $OT Eval Moderate Complexity: 1 Mod  Darryl Nestle) Marsa Aris OTR/L Acute Rehabilitation Services Pager: [phone] Office: [phone]    Audie Pinto 03/07/2019, 3:29 PM

## 2019-03-08 ENCOUNTER — Encounter (HOSPITAL_COMMUNITY): Payer: Self-pay

## 2019-03-08 LAB — COMPREHENSIVE METABOLIC PANEL
ALT: 6 U/L (ref 0–44)
AST: 14 U/L — ABNORMAL LOW (ref 15–41)
Albumin: 2.2 g/dL — ABNORMAL LOW (ref 3.5–5.0)
Alkaline Phosphatase: 83 U/L (ref 38–126)
Anion gap: 8 (ref 5–15)
BUN: 12 mg/dL (ref 8–23)
CO2: 24 mmol/L (ref 22–32)
Calcium: 9 mg/dL (ref 8.9–10.3)
Chloride: 107 mmol/L (ref 98–111)
Creatinine, Ser: 0.57 mg/dL (ref 0.44–1.00)
GFR calc Af Amer: >60 mL/min (ref >60)
GFR calc non Af Amer: >60 mL/min (ref >60)
Glucose, Bld: 110 mg/dL — ABNORMAL HIGH (ref 70–99)
Potassium: 3.5 mmol/L (ref 3.5–5.1)
Sodium: 139 mmol/L (ref 135–145)
Total Bilirubin: 0.6 mg/dL (ref 0.3–1.2)
Total Protein: 5.8 g/dL — ABNORMAL LOW (ref 6.5–8.1)

## 2019-03-08 LAB — GLUCOSE, CAPILLARY
Glucose-Capillary: 107 mg/dL — ABNORMAL HIGH (ref 70–99)
Glucose-Capillary: 171 mg/dL — ABNORMAL HIGH (ref 70–99)
Glucose-Capillary: 88 mg/dL (ref 70–99)
Glucose-Capillary: 124 mg/dL — ABNORMAL HIGH (ref 70–99)

## 2019-03-08 LAB — CBC
HCT: 30.4 % — ABNORMAL LOW (ref 36.0–46.0)
Hemoglobin: 10 g/dL — ABNORMAL LOW (ref 12.0–15.0)
MCH: 29.6 pg (ref 26.0–34.0)
MCHC: 32.9 g/dL (ref 30.0–36.0)
MCV: 89.9 fL (ref 80.0–100.0)
Platelets: 260 10*3/uL (ref 150–400)
RBC: 3.38 MIL/uL — ABNORMAL LOW (ref 3.87–5.11)
RDW: 13.3 % (ref 11.5–15.5)
WBC: 16.5 10*3/uL — ABNORMAL HIGH (ref 4.0–10.5)
nRBC: 0 % (ref 0.0–0.2)

## 2019-03-08 LAB — MAGNESIUM: Magnesium: 1.6 mg/dL — ABNORMAL LOW (ref 1.7–2.4)

## 2019-03-08 MED ORDER — MAGNESIUM SULFATE 2 GM/50ML IV SOLN
2.0000 g | Freq: Once | INTRAVENOUS | Status: AC
  Administered 2019-03-08: 2 g via INTRAVENOUS
  Filled 2019-03-08: qty 50

## 2019-03-08 MED ORDER — HYPROMELLOSE (GONIOSCOPIC) 2.5 % OP SOLN: 1.0000 [drp] | Freq: Four times a day (QID) | OPHTHALMIC | Status: DC | PRN

## 2019-03-08 NOTE — Progress Notes (Addendum)
**Note De-Identified vi Obfusction** PROGRESS NOTE    [name] [name]  IOM:[id number] DOB: 1951-03-07 DOA: 03/05/2019 PCP: Fnny Bien, MD   Brief Nrrtive:  Per HPI [name] [name] is  68 y.o. femle with medicl history significnt of depression, obstructive sleep pne, dibetes mellitus type 2, essentil hypertension, distolic CHF, hyperlipidemi ws brought to the hospitl for evlution of wekness for 2 dys nd sliding of her chir onto the floor.  She is lso hd some subjective fevers nd chills with concerns for infection in her scrum re of her pressure ulcer.  Ptient sttes she hs been feeling very wek nd hs remined pretty much bedbound over the lst couple of dys.  Typiclly she uses wlker to get round.She gets home helth who comes in dily.  In the ER ptient ws noted to hve reltive leukocytosis, febrile nd tchycrdi.  CT of the pelvis showed concerning for cellulitis extending into her left gluteus re with slight dringe.  She ws strted on IV Rocephin nd vncomycin.  Medicl tem requested to dmit the ptient.  Ptient denies ny respirtory symptoms including shortness of breth, cough.  Denies ny sick contcts including concerns for COVID.  Admitted for cellulitis with decubitus ulcer.  Improving on ntibiotics.  Trnsfer to telemetry.  Likely d/c within 24-48 hrs if continuing to improve.   Assessment & Pln:   Principl Problem:   Sepsis ([place]) Active Problems:   Dibetes type 2, uncontrolled (HCC)   Lumbr rdiculopthy   OSA on CPAP   Cellulitis of buttock, left   Pressure injury of skin  Sepsis present prior to dmission secondry to cute cellulitis of the left buttock re with mildly drining bscess.  - Continue IV vncomycin nd ceftrixone (4/23 - present).  - Follow MRSA PCR - CT without evidence of bscess.  "inflmmtion in the posteromedil L thigh extending to the glutel fold nd to the L inguinl intertriginous fold, more comptible with cellulitis  thn decubitus ulcer" - wound cre consult, pprecite recs - Pin control, bowel regimen. - Appers to hve cndidl intertrigo s well, will strt nysttin - Worsening leukocytosis, continue to monitor.  She's febrile.  If not continuing to improve, consider reimging or Kore.    Generlized wekness -Secondry to underlying infection - PT/OT evl  Dibetes mellitus type 2 - SSI - Follow A1c (5.7)  History of Prkinson's disese -Continue Sinemet.  On Lexpro.  Essentil hypertension -Olmesrtn on hold  Obstructive sleep pne -Supportive cre.  DVT prophylxis: heprin  Code Sttus: full  Fmily Communiction: none t bedside, will cll - discussed with son 4/26 Disposition Pln: pending further improvement   Consultnts:   none  Procedures:   none  Antimicrobils:  Anti-infectives (From dmission, onwrd)   Strt     Dose/Rte Route Frequency Ordered Stop   03/06/19 1900  vncomycin (VANCOCIN) IVPB 1000 mg/200 mL premix     1,000 mg 200 mL/hr over 60 Minutes Intrvenous Every 24 hours 03/05/19 1918     03/06/19 1800  cefTRIAXone (ROCEPHIN) 1 g in sodium chloride 0.9 % 100 mL IVPB     1 g 200 mL/hr over 30 Minutes Intrvenous Every 24 hours 03/06/19 1042     03/05/19 2145  vncomycin (VANCOCIN) IVPB 1000 mg/200 mL premix  Sttus:  Discontinued     1,000 mg 200 mL/hr over 60 Minutes Intrvenous  Once 03/05/19 2144 03/05/19 2146   03/05/19 1815  vncomycin (VANCOCIN) IVPB 1000 mg/200 mL premix  Sttus:  Discontinued     1,000 mg 200 **Note De-Identified vi Obfusction** mL/hr over 60 Minutes Intrvenous  Once 03/05/19 1802 03/05/19 1805   03/05/19 1815  cefTRIAXone (ROCEPHIN) 2 g in sodium chloride 0.9 % 100 mL IVPB     2 g 200 mL/hr over 30 Minutes Intrvenous  Once 03/05/19 1802 03/05/19 1858   03/05/19 1815  vncomycin (VANCOCIN) 1,500 mg in sodium chloride 0.9 % 500 mL IVPB     1,500 mg 250 mL/hr over 120 Minutes Intrvenous  Once 03/05/19 1805 03/05/19 2113      Subjective: No  complints. Nurse t bedside, bout to get  bth.  Objective: Vitls:   03/07/19 2111 03/08/19 0039 03/08/19 0403 03/08/19 0813  BP: 138/64 128/69 (!) 118/51 129/69  Pulse: 91  81 74  Resp: (!) 30  (!) 25 (!) 22  Temp: 99.7 F (37.6 C)  98.2 F (36.8 C) 98 F (36.7 C)  TempSrc: Orl  Orl Orl  SpO2: 95%  96% 98%  Weight:   87.7 kg   Height:        Intke/Output Summry (Lst 24 hours) t 03/08/2019 1208 Lst dt filed t 03/08/2019 9390 Gross per 24 hour  Intke -  Output 400 ml  Net -400 ml   Filed Weights   03/06/19 0404 03/07/19 0430 03/08/19 0403  Weight: 89.8 kg 92.3 kg 87.7 kg    Exmintion:  Generl: No cute distress. Crdiovsculr: Hert sounds show  regulr rte, nd rhythm.  Lungs: Cler to usculttion bilterlly  Abdomen: Soft, nontender, nondistended Neurologicl: Alert nd oriented 3. Moves ll extremities 4. Crnil nerves II through XII grossly intct. Extremities: no lee Psychitric: Mood nd ffect re norml. Insight nd judgment re pproprite.   Dt Reviewed: I hve personlly reviewed following lbs nd imging studies  CBC: Recent Lbs  Lb 03/05/19 1810 03/06/19 0039 03/07/19 0422 03/08/19 0318  WBC 13.2* 11.6* 15.5* 16.5*  NEUTROABS 11.3* 9.6*  --   --   HGB 11.5* 10.5* 9.8* 10.0*  HCT 35.7* 33.2* 30.1* 30.4*  MCV 92.5 91.5 91.5 89.9  PLT 238 213 218 300   Bsic Metbolic Pnel: Recent Lbs  Lb 03/05/19 1810 03/06/19 0039 03/07/19 0422 03/08/19 0318  NA 136 142 140 139  K 3.7 3.0* 3.6 3.5  CL 102 111 110 107  CO2 22 22 19* 24  GLUCOSE 200* 127* 115* 110*  BUN 16 11 18 12   CREATININE 0.76 0.61 0.70 0.57  CALCIUM 9.7 8.7* 8.9 9.0  MG  --  1.4* 1.7 1.6*   GFR: Estimted Cretinine Clernce: 69.5 mL/min (by C-G formul bsed on SCr of 0.57 mg/dL). Liver Function Tests: Recent Lbs  Lb 03/05/19 1810 03/06/19 0039 03/08/19 0318  AST 19 13* 14*  ALT 18 6 6   ALKPHOS 75 66 83  BILITOT 0.6 0.9 0.6  PROT  6.7 6.0* 5.8*  ALBUMIN 3.4* 2.7* 2.2*   No results for input(s): LIPASE, AMYLASE in the lst 168 hours. No results for input(s): AMMONIA in the lst 168 hours. Cogultion Profile: No results for input(s): INR, PROTIME in the lst 168 hours. Crdic Enzymes: No results for input(s): CKTOTAL, CKMB, CKMBINDEX, TROPONINI in the lst 168 hours. BNP (lst 3 results) No results for input(s): PROBNP in the lst 8760 hours. HbA1C: Recent Lbs    03/06/19 0039  HGBA1C 5.7*   CBG: Recent Lbs  Lb 03/07/19 1228 03/07/19 1547 03/07/19 2117 03/08/19 0735 03/08/19 1053  GLUCAP 119* 119* 123* 107* 171*   Lipid Profile: No results for input(s): CHOL, HDL, LDLCALC, TRIG, CHOLHDL, LDLDIRECT in the last 72 hours. Thyroid Function Tests: No results for input(s): TSH, T4TOTL, FREET4, T3FREE, THYROIDB in the last 72 hours. nemia Panel: No results for input(s): VITMINB12, FOLTE, FERRITIN, TIBC, IRON, RETICCTPCT in the last 72 hours. Sepsis Labs: Recent Labs  Lab 03/05/19 1810 03/06/19 0039  LTICCIDVEN 2.4* 1.0    Recent Results (from the past 240 hour(s))  Blood Culture (routine x 2)     Status: None (Preliminary result)   Collection Time: 03/05/19  6:10 PM  Result Value Ref Range Status   Specimen Description BLOOD RIGHT FORERM  Final   Special Requests   Final    BOTTLES DRWN EROBIC ND NEROBIC Blood Culture adequate volume   Culture   Final    NO GROWTH 3 DYS Performed at [facility], [address], [place], [place] [address]    Report Status PENDING  Incomplete  Blood Culture (routine x 2)     Status: None (Preliminary result)   Collection Time: 03/05/19  6:39 PM  Result Value Ref Range Status   Specimen Description BLOOD LEFT HND  Final   Special Requests   Final    BOTTLES DRWN EROBIC ND NEROBIC Blood Culture adequate volume   Culture   Final    NO GROWTH 3 DYS Performed at [facility], [place] [address] [place], [place] [address]     Report Status PENDING  Incomplete  Urine culture     Status: bnormal   Collection Time: 03/05/19  8:30 PM  Result Value Ref Range Status   Specimen Description URINE, CLEN CTCH  Final   Special Requests   Final    NONE Performed at [facility], [place] [address], [place], [place] [address]    Culture MULTIPLE SPECIES PRESENT, SUGGEST RECOLLECTION ()  Final   Report Status 03/06/2019 FINL  Final         Radiology Studies: No results found.      Scheduled Meds: . aspirin  81 mg Oral Daily  . carbidopa-levodopa  2 tablet Oral TID  . cholecalciferol  5,000 Units Oral Daily  . escitalopram  15 mg Oral Daily  . fenofibrate  160 mg Oral q morning - 10a  . heparin  5,000 Units Subcutaneous Q8H  . insulin aspart  0-15 Units Subcutaneous TID WC  . insulin aspart  0-5 Units Subcutaneous QHS  . mupirocin cream   Topical BID  . nystatin   Topical TID   Continuous Infusions: . cefTRIXone (ROCEPHIN)  IV 1 g (03/07/19 1838)  . vancomycin 1,000 mg (03/07/19 2223)     LOS: 3 days    Time spent: over 68 min    Fayrene Helper, MD [facility] Pager MION  If 7PM-7M, please contact night-coverage [email or web address] Password [facility] 03/08/2019, 12:08 PM

## 2019-03-08 NOTE — Progress Notes (Signed)
Pharmacy Antibiotic Note  [name] [name] is a 68 y.o. female admitted on 03/05/2019 with cellulitis.  Pharmacy has been consulted for vancomycin dosing. Ceftriaxone per MD dosing.  Plan: Vancomycin 1000mg  IV every 24 hours (calc AUC 470, SCr 0.76) Monitor renal function, Cx and clinical progression to narrow Vancomycin levels in next day or two if vancomycin continues.  Height: 5' 1.5" (156.2 cm) Weight: 193 lb 5.5 oz (87.7 kg) IBW/kg (Calculated) : 48.95  Temp (24hrs), Avg:98.3 F (36.8 C), Min:97.4 F (36.3 C), Max:99.7 F (37.6 C)  Recent Labs  Lab 03/05/19 1810 03/06/19 0039 03/07/19 0422 03/08/19 0318  WBC 13.2* 11.6* 15.5* 16.5*  CREATININE 0.76 0.61 0.70 0.57  LATICACIDVEN 2.4* 1.0  --   --     Estimated Creatinine Clearance: 69.5 mL/min (by C-G formula based on SCr of 0.57 mg/dL).    Allergies  Allergen Reactions  . Aleve [Naproxen] Hives, Shortness Of Breath and Rash    Pt Avoids All Nsaids  . Protonix [Pantoprazole Sodium] Other (See Comments)    Makes her feel wreidd  . Zocor [Simvastatin] Other (See Comments)    "weird feeling all over body"    Antimicrobials this admission: Vanc 4/23>> Ceftriaxone 4/23 >   Dose adjustments this admission: n/a  Microbiology results: 4/23 BCx x 2: ngtd 4/23 UCx: multiple species  Marguerite Olea, [facility] Clinical Pharmacist Phone [phone]  03/08/2019 11:03 AM

## 2019-03-09 ENCOUNTER — Encounter: Payer: Self-pay | Admitting: Internal Medicine

## 2019-03-09 ENCOUNTER — Encounter (HOSPITAL_COMMUNITY): Payer: Self-pay | Admitting: General Surgery

## 2019-03-09 ENCOUNTER — Inpatient Hospital Stay (HOSPITAL_COMMUNITY): Payer: PPO

## 2019-03-09 DIAGNOSIS — Z515 Encounter for palliative care: Secondary | ICD-10-CM | POA: Insufficient documentation

## 2019-03-09 LAB — COMPREHENSIVE METABOLIC PANEL
ALT: 9 U/L (ref 0–44)
ALT: <5 U/L (ref 0–44)
AST: 18 U/L (ref 15–41)
AST: 15 U/L (ref 15–41)
Albumin: 2.5 g/dL — ABNORMAL LOW (ref 3.5–5.0)
Albumin: 2.4 g/dL — ABNORMAL LOW (ref 3.5–5.0)
Alkaline Phosphatase: 72 U/L (ref 38–126)
Alkaline Phosphatase: 67 U/L (ref 38–126)
Anion gap: 9 (ref 5–15)
Anion gap: 5 (ref 5–15)
BUN: 11 mg/dL (ref 8–23)
BUN: 10 mg/dL (ref 8–23)
CO2: 26 mmol/L (ref 22–32)
CO2: 32 mmol/L (ref 22–32)
Calcium: 8.9 mg/dL (ref 8.9–10.3)
Calcium: 9.1 mg/dL (ref 8.9–10.3)
Chloride: 105 mmol/L (ref 98–111)
Chloride: 101 mmol/L (ref 98–111)
Creatinine, Ser: 0.55 mg/dL (ref 0.44–1.00)
Creatinine, Ser: 0.58 mg/dL (ref 0.44–1.00)
GFR calc Af Amer: >60 mL/min (ref >60)
GFR calc Af Amer: >60 mL/min (ref >60)
GFR calc non Af Amer: >60 mL/min (ref >60)
GFR calc non Af Amer: >60 mL/min (ref >60)
Glucose, Bld: 127 mg/dL — ABNORMAL HIGH (ref 70–99)
Glucose, Bld: 135 mg/dL — ABNORMAL HIGH (ref 70–99)
Potassium: 3.8 mmol/L (ref 3.5–5.1)
Potassium: 3.5 mmol/L (ref 3.5–5.1)
Sodium: 140 mmol/L (ref 135–145)
Sodium: 138 mmol/L (ref 135–145)
Total Bilirubin: 0.7 mg/dL (ref 0.3–1.2)
Total Bilirubin: 0.6 mg/dL (ref 0.3–1.2)
Total Protein: 6.1 g/dL — ABNORMAL LOW (ref 6.5–8.1)
Total Protein: 5.8 g/dL — ABNORMAL LOW (ref 6.5–8.1)

## 2019-03-09 LAB — GLUCOSE, CAPILLARY
Glucose-Capillary: 120 mg/dL — ABNORMAL HIGH (ref 70–99)
Glucose-Capillary: 138 mg/dL — ABNORMAL HIGH (ref 70–99)
Glucose-Capillary: 107 mg/dL — ABNORMAL HIGH (ref 70–99)
Glucose-Capillary: 125 mg/dL — ABNORMAL HIGH (ref 70–99)

## 2019-03-09 LAB — CBC
HCT: 33.2 % — ABNORMAL LOW (ref 36.0–46.0)
Hemoglobin: 11 g/dL — ABNORMAL LOW (ref 12.0–15.0)
MCH: 29.6 pg (ref 26.0–34.0)
MCHC: 33.1 g/dL (ref 30.0–36.0)
MCV: 89.5 fL (ref 80.0–100.0)
Platelets: 341 10*3/uL (ref 150–400)
RBC: 3.71 MIL/uL — ABNORMAL LOW (ref 3.87–5.11)
RDW: 13.4 % (ref 11.5–15.5)
WBC: 9.4 10*3/uL (ref 4.0–10.5)
nRBC: 0 % (ref 0.0–0.2)

## 2019-03-09 LAB — MAGNESIUM: Magnesium: 1.8 mg/dL (ref 1.7–2.4)

## 2019-03-09 MED ORDER — GLUCERNA SHAKE PO LIQD
237.0000 mL | Freq: Three times a day (TID) | ORAL | Status: DC
  Administered 2019-03-10 – 2019-03-12 (×4): 237 mL via ORAL

## 2019-03-09 MED ORDER — BUPIVACAINE-EPINEPHRINE (PF) 0.5% -1:200000 IJ SOLN
INTRAMUSCULAR | Status: AC
  Filled 2019-03-09: qty 30

## 2019-03-09 NOTE — Progress Notes (Signed)
Physical Therapy Treatment Patient Details Name: [name] [name] MRN: [id number] DOB: 1951-11-10 Today's Date: 03/09/2019    History of Present Illness Pt is a 68 y.o. female admitted from [facility] 03/05/19 with progressive decline and weakness over past several days. Worked up for sepsis secondary to acute cellulitis of L buttock wound. CXR without acute infection. PMH includes progressive Parksinson's, progressive supranuclear palsy, DM2, HTN, OSA, CHF.     PT Comments    Pt agreeable to work with therapy today, however has c/o of perineal pain of 5/10. Pt requires maxAx2 to come to EoB. Once in seated pt requires 2-3 minutes to progress from maxA for seated balance to min guard. Pt able to sit for 12 minutes EoB and perform limited seated therapeutic exercise. Pt attempted to lift hips from EoB to assist in scooting up towards HoB however unable to achieve. With total A pt returned to supine in bed. Pt is scheduled for surgical intervention on abscess tomorrow. D/c plans remain appropriate at this time. PT will continue to follow acutely.    Follow Up Recommendations  Home health PT;Supervision for mobility/OOB     Equipment Recommendations  None recommended by PT       Precautions / Restrictions Precautions Precautions: Fall Precaution Comments: Sacral and perineal wounds Restrictions Weight Bearing Restrictions: No    Mobility  Bed Mobility Overal bed mobility: Needs Assistance Bed Mobility: Supine to Sit;Sidelying to Sit Rolling: Min assist;+2 for physical assistance Sidelying to sit: Max assist;+2 for physical assistance   Sit to supine: +2 for physical assistance;Total assist   General bed mobility comments: minA for rolling onto R side, maxAx2 for LE off bed and trunk to upright, with fatigue pt requires total A to return to bed, however once in bed pt able to bridge up to assist in scooting to [facility]  Transfers                 General transfer comment: unable to  attempt due to increased fatigue sitting EoB        Balance Overall balance assessment: Needs assistance   Sitting balance-Leahy Scale: Poor Sitting balance - Comments: Able to maintain sitting balance with intermittent min guard, easily fatigued requiring min-maxA                                    Cognition Arousal/Alertness: Awake/alert Behavior During Therapy: WFL for tasks assessed/performed Overall Cognitive Status: Within Functional Limits for tasks assessed                                 General Comments: Good insight into progressive condition      Exercises General Exercises - Lower Extremity Ankle Circles/Pumps: AROM;Both;10 reps;Supine Long Arc Quad: AROM;Both;10 reps;Seated Hip Flexion/Marching: AROM;Both;10 reps;Seated    General Comments General comments (skin integrity, edema, etc.): VSS      Pertinent Vitals/Pain Pain Assessment: 0-10 Pain Score: 5  Pain Location: buttock Pain Descriptors / Indicators: Discomfort;Grimacing           PT Goals (current goals can now be found in the care plan section) Acute Rehab PT Goals Patient Stated Goal: to go home PT Goal Formulation: With patient Time For Goal Achievement: 03/21/19 Potential to Achieve Goals: Good Progress towards PT goals: Progressing toward goals    Frequency    Min 3X/week  PT Plan Current plan remains appropriate       AM-PAC PT "6 Clicks" Mobility   Outcome Measure  Help needed turning from your back to your side while in a flat bed without using bedrails?: A Lot Help needed moving from lying on your back to sitting on the side of a flat bed without using bedrails?: A Lot Help needed moving to and from a bed to a chair (including a wheelchair)?: Total Help needed standing up from a chair using your arms (e.g., wheelchair or bedside chair)?: Total Help needed to walk in hospital room?: Total Help needed climbing 3-5 steps with a railing? :  Total 6 Click Score: 8    End of Session   Activity Tolerance: Patient tolerated treatment well;Patient limited by fatigue Patient left: in bed;with call bell/phone within reach;with bed alarm set Nurse Communication: Mobility status;Need for lift equipment PT Visit Diagnosis: Other abnormalities of gait and mobility (R26.89)     Time: 8676-1950 PT Time Calculation (min) (ACUTE ONLY): 20 min  Charges:  $Therapeutic Exercise: 8-22 mins                     Lucy B. Migdalia Dk PT, DPT Acute Rehabilitation Services Pager [phone] Office [phone]    [place] 03/09/2019, 4:09 PM

## 2019-03-09 NOTE — Progress Notes (Signed)
Initial Nutrition Assessment  DOCUMENTATION CODES:   Obesity unspecified  INTERVENTION:   Provide Glucerna Shake po TID, each supplement provides 220 kcal and 10 grams of protein  NUTRITION DIAGNOSIS:   Increased nutrient needs related to wound healing as evidenced by estimated needs.  GOAL:   Patient will meet greater than or equal to 90% of their needs  MONITOR:   PO intake, Supplement acceptance, Labs, Weight trends, I & O's, Skin  REASON FOR ASSESSMENT:   Low Braden    ASSESSMENT:   68 y.o. female with medical history significant of depression, obstructive sleep apnea, diabetes mellitus type 2, essential hypertension, diastolic CHF, hyperlipidemia was brought to the hospital for evaluation of weakness for 2 days and sliding of her chair onto the floor. Admitted for cellulitis with decubitus ulcer.   **RD working remotely**  Patient currently consuming 25-50% of meals. SLP evaluation from 4/24, recommended dysphagia 3 diet with thin liquids. Pt presented with mild dysphagia and history of getting "choked" on foods. Pt reports being bedbound for days PTA. Pt with new stage 2 buttocks ulcer. RD to order Glucerna shakes TID. Pt would likely benefit from Juven supplements to aid in wound healing but will consider once sepsis is resolved.  Per weight records, pt weighed 189 lb in June 2019. Overall weight increase but this admission has had a decrease. Suspect this may be related to fluid status plus poor PO.    Medications: Vitamin D tablet daily Labs reviewed: CBGs: 120-138  NUTRITION - FOCUSED PHYSICAL EXAM:  Unable to perform per department requirements to work remotely.  Diet Order:   Diet Order            DIET DYS 3 Room service appropriate? No; Fluid consistency: Thin  Diet effective now              EDUCATION NEEDS:   No education needs have been identified at this time  Skin:  Skin Assessment: Skin Integrity Issues: Skin Integrity Issues:: Stage  II Stage II: buttocks  Last BM:  4/27  Height:   Ht Readings from Last 1 Encounters:  03/05/19 5' 1.5" (1.562 m)    Weight:   Wt Readings from Last 1 Encounters:  03/09/19 88.9 kg    Ideal Body Weight:  50 kg  BMI:  Body mass index is 36.43 kg/m.  Estimated Nutritional Needs:   Kcal:  1550-1750  Protein:  60-70g  Fluid:  1.7L/day  Clayton Bibles, MS, RD, LDN [place] Dietitian Pager: [phone] After Hours Pager: [phone]

## 2019-03-09 NOTE — Plan of Care (Signed)
  Problem: Education: Goal: Knowledge of General Education information will improve Description Including pain rating scale, medication(s)/side effects and non-pharmacologic comfort measures Outcome: Progressing   Problem: Health Behavior/Discharge Planning: Goal: Ability to manage health-related needs will improve Outcome: Progressing   Problem: Clinical Measurements: Goal: Ability to maintain clinical measurements within normal limits will improve Outcome: Progressing Goal: Will remain free from infection Outcome: Progressing Goal: Diagnostic test results will improve Outcome: Progressing Goal: Cardiovascular complication will be avoided Outcome: Progressing   Problem: Clinical Measurements: Goal: Respiratory complications will improve Outcome: Completed/Met

## 2019-03-09 NOTE — Consult Note (Signed)
[name] [name] 10-26-1951  [id number].    Requesting MD: Dr. Alben Deeds Chief Complaint/Reason for Consult: perineal wound  HPI:  This is a 68 yo white female with multiple medical problems as outlined below who presented to the ED several days ago with 2 days of weakness and pain of her left buttock.  She was admitted and placed on abx therapy.  WOC was consulted due to a wound involving the left gluteus.  Wet to dry dressings were recommended at that time.  It has been followed and after evaluation today by the [place], RN a surgical recommendation was made due to worsening of the cellulitis extending into her perineum towards her labia.  She had had a CT scan as well as an [place] that were relatively unremarkable except for some soft tissue edema.  Her WBC is normal today.  She does have DM which is relatively well controlled currently.  We have been asked to evaluate the patient for further surgical recommendations.  The patient admits to pain in this area, but denies CP/SOB/abdominal pain, etc.  ROS: ROS : Please see HPI, otherwise all other systems are currently negative.  Family History  Problem Relation Age of Onset  . Heart Problems Father   . Stroke Mother   . Hypertension Other   . Stroke Other   . Breast cancer Sister     Past Medical History:  Diagnosis Date  . Acute kidney failure (HCC)    hx of   . Arthritis    fingers,right shoulder  . Bradycardia   . 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 CHF (congestive heart failure) (HCC)    acute diastolic ( congestive ) heart failure)   . Chronic lumbar radiculopathy    L5- S1  right leg  . Constipation   . Depression   . Diverticulosis large intestine w/o perforation or abscess w/bleeding   . Dizziness   . Dry eye syndrome of bilateral lacrimal glands   . Frequency of urination   . GERD (gastroesophageal reflux disease)   . Hard of hearing   . History of kidney stones  05/12/15   surgery   . HTN (hypertension)   . Hyperlipidemia   . Hypokalemia   . Morbid obesity due to excess calories ([place])   . 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 Numbness and tingling in hands   . OSA (obstructive sleep apnea)    moderate OSA per study 11-22-2007--  refused CPAP but used oxygen for 6 months at night,  states due to wt loss stopped using oxygen  . Polyneuropathy, diabetic ([place])    WALKS W/ CANE FOR BALANCE  . 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 Pyonephrosis   . Pyonephrosis   . Radiculopathy of lumbar region   . Respiratory failure (HCC)    hx of acute respiratory failure wtih hypoxia   . Right ureteral stone   . Severe sepsis with septic shock (CODE) ([place])   . Shortness of breath dyspnea   . Spinal stenosis, cervical region   . SUI (stress urinary incontinence, female)   . Tubulo-interstitial nephritis   . Type 2 diabetes mellitus (HCC)    Type II - Diet controlled  . Urinary incontinence   . Urinary tract infection   . Wears glasses  Past Surgical History:  Procedure Laterality Date  . ANTERIOR CERVICAL DECOMP/DISCECTOMY FUSION N/A 03/23/2016   Procedure: Cervical Four-Five, Cervical Five-Six, Cervical Six-Seven Anterior cervical decompression/diskectomy/fusion;  Surgeon: Leeroy Cha, MD;  Location: [facility];  Service: Neurosurgery;  Laterality: N/A;  C4-5 C5-6 C6-7 Anterior cervical decompression/diskectomy/fusion  . CARDIOVASCULAR STRESS TEST  09-30-2007     normal Adenosine study/  no ischemia /  normal LV function and wall motion , ef 82%  . COLONOSCOPY    . CYSTOSCOPY WITH RETROGRADE PYELOGRAM, URETEROSCOPY AND STENT PLACEMENT Right 05/12/2015   Procedure: CYSTOSCOPY WITH RETROGRADE PYELOGRAM, URETEROSCOPY,STONE EXTRACTION AND STENT PLACEMENT;  Surgeon: Franchot Gallo, MD;  Location[facility];  Service: Urology;  Laterality: Right;   . CYSTOSCOPY WITH RETROGRADE PYELOGRAM, URETEROSCOPY AND STENT PLACEMENT Right 04/25/2018   Procedure: CYSTOSCOPY WITH RETROGRADE PYELOGRAM, URETEROSCOPY AND STENT EXCHANGE;  Surgeon: Alexis Frock, MD;  Location: [facility];  Service: Urology;  Laterality: Right;  . CYSTOSCOPY WITH STENT PLACEMENT Right 03/28/2018   Procedure: CYSTOSCOPY WITH STENT PLACEMENT retrograde pylegram;  Surgeon: Alexis Frock, MD;  Location: [facility];  Service: Urology;  Laterality: Right;  . EXTRACORPOREAL SHOCK WAVE LITHOTRIPSY Right 08-18-2012  . HOLMIUM LASER APPLICATION Right 05/11/1600   Procedure: HOLMIUM LASER APPLICATION;  Surgeon: Franchot Gallo, MD;  Location: [facility];  Service: Urology;  Laterality: Right;  . HOLMIUM LASER APPLICATION Right 0/93/2355   Procedure: HOLMIUM LASER APPLICATION;  Surgeon: Alexis Frock, MD;  Location: [facility];  Service: Urology;  Laterality: Right;  . ORIF HUMERUS FRACTURE Right 07/20/2016   Procedure: OPEN REDUCTION INTERNAL FIXATION (ORIF) PROXIMAL HUMERUS FRACTURE VS REVERSE TOTAL SHOULDER ARTHROPLASTY;  Surgeon: Netta Cedars, MD;  Location: [facility];  Service: Orthopedics;  Laterality: Right;  . SHOULDER ARTHROSCOPY Right 07/2016  . TRANSTHORACIC ECHOCARDIOGRAM  09-23-2007   pseudonormal LV filling pattern,  ef 65-70%/  trivial AR/  mild LAE  . TUBAL LIGATION  1985    Social History:  reports that she has never smoked. She has never used smokeless tobacco. She reports current alcohol use. She reports that she does not use drugs.  Allergies:  Allergies  Allergen Reactions  . Aleve [Naproxen] Hives, Shortness Of Breath and Rash    Pt Avoids All Nsaids  . Protonix [Pantoprazole Sodium] Other (See Comments)    Makes her feel wreidd  . Zocor [Simvastatin] Other (See Comments)    "weird feeling all over body"    Medications Prior to Admission  Medication Sig Dispense Refill  . azelastine (ASTELIN) 0.1 % nasal spray Place 2 sprays into both nostrils 2 (two)  times daily.   11  . carbidopa-levodopa (SINEMET IR) 25-100 MG tablet 2 tablets TID (Patient taking differently: Take 2 tablets by mouth 3 (three) times daily. ) 540 tablet 1  . diclofenac sodium (VOLTAREN) 1 % GEL Apply 4 g topically 4 (four) times daily.    Marland Kitchen escitalopram (LEXAPRO) 10 MG tablet Take 15 mg by mouth daily.   2  . fenofibrate 160 MG tablet Take 160 mg by mouth every morning.     . montelukast (SINGULAIR) 10 MG tablet Take 10 mg by mouth every evening.     . nystatin cream (MYCOSTATIN) Apply 1 application topically 2 (two) times daily.     Marland Kitchen olmesartan (BENICAR) 40 MG tablet Take 40 mg by mouth daily.    . traMADol (ULTRAM) 50 MG tablet Take 1 tablet (50 mg total) by mouth every 6 (six) hours as needed for moderate pain  or severe pain. Post-operatively (Patient taking differently: Take 50 mg by mouth See admin instructions. Every 6-8 hours as needed for pain) 20 tablet 0  . acetaminophen (TYLENOL) 325 MG tablet Take 650 mg by mouth 2 (two) times daily.     Marland Kitchen aspirin 81 MG chewable tablet Chew 81 mg by mouth daily.    . Cholecalciferol (VITAMIN D PO) Take 5,000 Units by mouth daily.    . cyanocobalamin 500 MCG tablet Take 500 mcg by mouth daily.    . fluticasone (FLONASE) 50 MCG/ACT nasal spray Place 2 sprays into both nostrils daily.   11  . loratadine (CLARITIN) 10 MG tablet Take 10 mg by mouth. Taking every other day.    . Melatonin 5 MG CAPS Take 10 mg by mouth at bedtime as needed.     . Menthol, Topical Analgesic, (BIOFREEZE EX) Apply topically.       Physical Exam: Blood pressure (!) 109/55, pulse 71, temperature (!) 97.5 F (36.4 C), temperature source Oral, resp. rate (!) 21, height 5' 1.5" (1.562 m), weight 88.9 kg, SpO2 99 %. General: pleasant, WD, WN white female who is laying in bed in NAD HEENT: head is normocephalic, atraumatic.  Sclera are noninjected.  PERRL.  Ears and nose without any masses or lesions.  Mouth is pink and moist Heart: regular, rate, and  rhythm.  Normal s1,s2. No obvious murmurs, gallops, or rubs noted.  Palpable radial and pedal pulses bilaterally Lungs: expiratory wheeze noted, otherwise no rhonchi or rales noted.  Respiratory effort nonlabored Abd: soft, NT, ND, +BS, no masses, hernias, or organomegaly MS: all 4 extremities are symmetrical with no cyanosis, clubbing, or edema. Skin: warm and dry with no masses, lesions, or rashes.  She does have cellulitis and skin breakdown of the very inferior left gluteus extending into her left perineum.  Her left labia is still soft and nonfluctuant.  No obvious purulent drainage, but a couple areas of necrotic skin are noted in this region. Psych: A&Ox3 with an appropriate affect.   Results for orders placed or performed during the hospital encounter of 03/05/19 (from the past 48 hour(s))  Glucose, capillary     Status: Abnormal   Collection Time: 03/07/19  3:47 PM  Result Value Ref Range   Glucose-Capillary 119 (H) 70 - 99 mg/dL  Glucose, capillary     Status: Abnormal   Collection Time: 03/07/19  9:17 PM  Result Value Ref Range   Glucose-Capillary 123 (H) 70 - 99 mg/dL  CBC     Status: Abnormal   Collection Time: 03/08/19  3:18 AM  Result Value Ref Range   WBC 16.5 (H) 4.0 - 10.5 K/uL   RBC 3.38 (L) 3.87 - 5.11 MIL/uL   Hemoglobin 10.0 (L) 12.0 - 15.0 g/dL   HCT 30.4 (L) 36.0 - 46.0 %   MCV 89.9 80.0 - 100.0 fL   MCH 29.6 26.0 - 34.0 pg   MCHC 32.9 30.0 - 36.0 g/dL   RDW 13.3 11.5 - 15.5 %   Platelets 260 150 - 400 K/uL   nRBC 0.0 0.0 - 0.2 %    Comment: Performed at [facility], [place] [address] [place], [place] [address]  Comprehensive metabolic panel     Status: Abnormal   Collection Time: 03/08/19  3:18 AM  Result Value Ref Range   Sodium 139 135 - 145 mmol/L   Potassium 3.5 3.5 - 5.1 mmol/L   Chloride 107 98 - 111 mmol/L   CO2  24 22 - 32 mmol/L   Glucose, Bld 110 (H) 70 - 99 mg/dL   BUN 12 8 - 23 mg/dL   Creatinine, Ser 0.57 0.44 - 1.00 mg/dL   Calcium  9.0 8.9 - 10.3 mg/dL   Total Protein 5.8 (L) 6.5 - 8.1 g/dL   Albumin 2.2 (L) 3.5 - 5.0 g/dL   AST 14 (L) 15 - 41 U/L   ALT 6 0 - 44 U/L   Alkaline Phosphatase 83 38 - 126 U/L   Total Bilirubin 0.6 0.3 - 1.2 mg/dL   GFR calc non Af Amer >60 >60 mL/min   GFR calc Af Amer >60 >60 mL/min   Anion gap 8 5 - 15    Comment: Performed at [facility] [address], [place], [place] [address]  Magnesium     Status: Abnormal   Collection Time: 03/08/19  3:18 AM  Result Value Ref Range   Magnesium 1.6 (L) 1.7 - 2.4 mg/dL    Comment: Performed at [facility] [address] [place], [place] [address]  Glucose, capillary     Status: Abnormal   Collection Time: 03/08/19  7:35 AM  Result Value Ref Range   Glucose-Capillary 107 (H) 70 - 99 mg/dL  Glucose, capillary     Status: Abnormal   Collection Time: 03/08/19 10:53 AM  Result Value Ref Range   Glucose-Capillary 171 (H) 70 - 99 mg/dL  Glucose, capillary     Status: None   Collection Time: 03/08/19  4:05 PM  Result Value Ref Range   Glucose-Capillary 88 70 - 99 mg/dL  Glucose, capillary     Status: Abnormal   Collection Time: 03/08/19  9:13 PM  Result Value Ref Range   Glucose-Capillary 124 (H) 70 - 99 mg/dL  Glucose, capillary     Status: Abnormal   Collection Time: 03/09/19  7:53 AM  Result Value Ref Range   Glucose-Capillary 120 (H) 70 - 99 mg/dL  CBC     Status: Abnormal   Collection Time: 03/09/19  8:40 AM  Result Value Ref Range   WBC 9.4 4.0 - 10.5 K/uL   RBC 3.71 (L) 3.87 - 5.11 MIL/uL   Hemoglobin 11.0 (L) 12.0 - 15.0 g/dL   HCT 33.2 (L) 36.0 - 46.0 %   MCV 89.5 80.0 - 100.0 fL   MCH 29.6 26.0 - 34.0 pg   MCHC 33.1 30.0 - 36.0 g/dL   RDW 13.4 11.5 - 15.5 %   Platelets 341 150 - 400 K/uL   nRBC 0.0 0.0 - 0.2 %    Comment: Performed at [facility], [place]. [address] [place], [place] [address]  Comprehensive metabolic panel     Status: Abnormal   Collection Time: 03/09/19  8:40 AM  Result Value Ref  Range   Sodium 140 135 - 145 mmol/L   Potassium 3.8 3.5 - 5.1 mmol/L   Chloride 105 98 - 111 mmol/L   CO2 26 22 - 32 mmol/L   Glucose, Bld 127 (H) 70 - 99 mg/dL   BUN 11 8 - 23 mg/dL   Creatinine, Ser 0.55 0.44 - 1.00 mg/dL   Calcium 8.9 8.9 - 10.3 mg/dL   Total Protein 6.1 (L) 6.5 - 8.1 g/dL   Albumin 2.5 (L) 3.5 - 5.0 g/dL   AST 18 15 - 41 U/L   ALT 9 0 - 44 U/L   Alkaline Phosphatase 72 38 - 126 U/L   Total Bilirubin 0.7  0.3 - 1.2 mg/dL   GFR calc non Af Amer >60 >60 mL/min   GFR calc Af Amer >60 >60 mL/min   Anion gap 9 5 - 15    Comment: Performed at [facility] [address], [place], [place] [address]  Magnesium     Status: None   Collection Time: 03/09/19  8:40 AM  Result Value Ref Range   Magnesium 1.8 1.7 - 2.4 mg/dL    Comment: Performed at [facility] [address] [place], [place] [address]  Glucose, capillary     Status: Abnormal   Collection Time: 03/09/19 12:12 PM  Result Value Ref Range   Glucose-Capillary 138 (H) 70 - 99 mg/dL   [facility] Soft Tissue Non Vascular  Result Date: 03/09/2019 CLINICAL DATA:  Swelling and drainage over the left gluteal region 1 week. Diabetes and obesity with hypertension. EXAM: ULTRASOUND LEFT LOWER EXTREMITY LIMITED TECHNIQUE: Ultrasound examination of the lower extremity soft tissues was performed in the area of clinical concern inferior to the left gluteal region adjacent the midline. COMPARISON:  Pelvic CT 03/05/2019 FINDINGS: Joint Space: Not evaluated. Muscles: Not evaluated. Tendons: Not evaluated. Other Soft Tissue Structures: Ultrasound exam over the area in question inferior to the left gluteal region adjacent the midline demonstrates moderate subcutaneous edema without focal fluid collection to suggest subcutaneous abscess. Findings may be due to soft tissue infection. IMPRESSION: Moderate subcutaneous edema over the area in question adjacent the midline inferior to the left gluteal region. No  evidence of subcutaneous abscess. Findings may be due to cellulitis without focal abscess. Electronically Signed   By: Marin Olp M.D.   On: 03/09/2019 12:02      Assessment/Plan DM Generalized weakness H/o Parkinson's disease HTN OSA  Perineal cellulitis and infection The patient has an infection of her perineum that will require surgical debridement.  She has already eaten today.  Given her vital signs are stable and her WBC is normal (surprisingly) we will make her NPO p MN and plan for surgical debridement tomorrow in the OR.  I have discussed the procedure including risks and complications such as bleeding, infection, large wound, need for multiple procedures, and risk of anesthetic complications.  She understands and is agreeable to proceed.  FEN - carb mod, NPO p MN VTE - heparin ID - Rocephin/Vanc POC- son Celene Kras. [phone], spoke to regarding surgery tomorrow and overall situation  Henreitta Cea, [facility] Surgery 03/09/2019, 3:26 PM Pager: [phone]

## 2019-03-09 NOTE — Progress Notes (Addendum)
PROGRESS NOTE    [name] [name]  ZSW:[id number] DOB: [birth year] DOA: 03/05/2019 PCP: [name], MD   Brief Narrative:  Per HPI [name] [name] is a 68 y.o. female with medical history significant of depression, obstructive sleep apnea, diabetes mellitus type 2, essential hypertension, diastolic CHF, hyperlipidemia was brought to the hospital for evaluation of weakness for 2 days and sliding of her chair onto the floor.  She is also had some subjective fevers and chills with concerns for infection in her sacrum area of her pressure ulcer.  Patient states she has been feeling very weak and has remained pretty much bedbound over the last couple of days.  Typically she uses walker to get around.She gets home health who comes in daily.  In the ER patient was noted to have relative leukocytosis, febrile and tachycardia.  CT of the pelvis showed concerning for cellulitis extending into her left gluteus area with slight drainage.  She was started on IV Rocephin and vancomycin.  Medical team requested to admit the patient.  Patient denies any respiratory symptoms including shortness of breath, cough.  Denies any sick contacts including concerns for COVID.  Admitted for cellulitis with L buttock wound.  Initially thought to be improving, but with worsening purulence from wound on 4/27.  Surgery consulted and plan for surgery on 4/28.  Assessment & Plan:   Principal Problem:   Sepsis ([place]) Active Problems:   Diabetes type 2, uncontrolled (HCC)   Lumbar radiculopathy   OSA on CPAP   Cellulitis of buttock, left   Pressure injury of skin  Sepsis present prior to admission secondary to acute cellulitis of the left buttock area with mildly draining abscess.  - Continue IV vancomycin and ceftriaxone - Follow MRSA PCR (pending) - CT without evidence of abscess.  "inflammation in the posteromedial L thigh extending to the gluteal fold and to the L inguinal intertriginous fold, more  compatible with cellulitis than decubitus ulcer" - 4/27, wound looked worse in appearance, with increasing purulent drainaing - [place] ordered -> with moderate edema, no evidence of subcutaneous abscess - Surgery was consulted given lack of improvement and worsening noted on exam 4/27 -> planning for OR for debridement on 4/28  - wound care consult, appreciate recs -> recommending surgery c/s 4/27 - Pain control, bowel regimen. - Appears to have candidal intertrigo as well, will start nystatin - She's afebrile since 4/23.  Her leukocytosis has resolved.  Given worsening appearance of wound, have c/s surgery as above.      Generalized weakness -Secondary to underlying infection - PT/OT eval  Diabetes mellitus type 2 - SSI - Follow A1c (5.7)  History of Parkinson's disease -Continue Sinemet.  On Lexapro.  Essential hypertension -Olmesartan on hold  Obstructive sleep apnea -Supportive care.  DVT prophylaxis: heparin  Code Status: full  Family Communication: none at bedside, will call - discussed with son Disposition Plan: pending further improvement   Consultants:   none  Procedures:   none  Antimicrobials:  Anti-infectives (From admission, onward)   Start     Dose/Rate Route Frequency Ordered Stop   03/06/19 1900  vancomycin (VANCOCIN) IVPB 1000 mg/200 mL premix     1,000 mg 200 mL/hr over 60 Minutes Intravenous Every 24 hours 03/05/19 1918     03/06/19 1800  cefTRIAXone (ROCEPHIN) 1 g in sodium chloride 0.9 % 100 mL IVPB     1 g 200 mL/hr over 30 Minutes Intravenous Every 24 hours 03/06/19 1042  03/05/19 2145  vancomycin (VANCOCIN) IVPB 1000 mg/200 mL premix  Status:  Discontinued     1,000 mg 200 mL/hr over 60 Minutes Intravenous  Once 03/05/19 2144 03/05/19 2146   03/05/19 1815  vancomycin (VANCOCIN) IVPB 1000 mg/200 mL premix  Status:  Discontinued     1,000 mg 200 mL/hr over 60 Minutes Intravenous  Once 03/05/19 1802 03/05/19 1805   03/05/19 1815   cefTRIAXone (ROCEPHIN) 2 g in sodium chloride 0.9 % 100 mL IVPB     2 g 200 mL/hr over 30 Minutes Intravenous  Once 03/05/19 1802 03/05/19 1858   03/05/19 1815  vancomycin (VANCOCIN) 1,500 mg in sodium chloride 0.9 % 500 mL IVPB     1,500 mg 250 mL/hr over 120 Minutes Intravenous  Once 03/05/19 1805 03/05/19 2113      Subjective: Feels well.   Objective: Vitals:   03/08/19 1607 03/08/19 2009 03/09/19 0020 03/09/19 0419  BP: 124/66 125/69 (!) 99/49 (!) 109/55  Pulse: 69 78 79 71  Resp: 17 (!) 24 (!) 27 (!) 21  Temp: 98 F (36.7 C) 97.8 F (36.6 C) 98.6 F (37 C) (!) 97.5 F (36.4 C)  TempSrc: Oral Oral Oral Oral  SpO2: 96% 94% 95% 99%  Weight:    88.9 kg  Height:        Intake/Output Summary (Last 24 hours) at 03/09/2019 1723 Last data filed at 03/09/2019 0422 Gross per 24 hour  Intake -  Output 350 ml  Net -350 ml   Filed Weights   03/07/19 0430 03/08/19 0403 03/09/19 0419  Weight: 92.3 kg 87.7 kg 88.9 kg    Examination:  General: No acute distress. Cardiovascular: Heart sounds show a regular rate, and rhythm. No gallops or rubs. No murmurs. No JVD. Lungs: Clear to auscultation bilaterally  Abdomen: Soft, nontender, nondistended  Neurological: Alert and oriented 3. Moves all extremities 4. Cranial nerves II through XII grossly intact. Skin: L buttock with stage III decubitus ulcer with surrounding erythema.  Ulcer now with purulent drainage, worsened from previous exams.  Extremities: No clubbing or cyanosis. No edema.     Data Reviewed: I have personally reviewed following labs and imaging studies  CBC: Recent Labs  Lab 03/05/19 1810 03/06/19 0039 03/07/19 0422 03/08/19 0318 03/09/19 0840  WBC 13.2* 11.6* 15.5* 16.5* 9.4  NEUTROABS 11.3* 9.6*  --   --   --   HGB 11.5* 10.5* 9.8* 10.0* 11.0*  HCT 35.7* 33.2* 30.1* 30.4* 33.2*  MCV 92.5 91.5 91.5 89.9 89.5  PLT 238 213 218 260 417   Basic Metabolic Panel: Recent Labs  Lab 03/05/19 1810 03/06/19  0039 03/07/19 0422 03/08/19 0318 03/09/19 0840  NA 136 142 140 139 140  K 3.7 3.0* 3.6 3.5 3.8  CL 102 111 110 107 105  CO2 22 22 19* 24 26  GLUCOSE 200* 127* 115* 110* 127*  BUN 16 11 18 12 11   CREATININE 0.76 0.61 0.70 0.57 0.55  CALCIUM 9.7 8.7* 8.9 9.0 8.9  MG  --  1.4* 1.7 1.6* 1.8   GFR: Estimated Creatinine Clearance: 70 mL/min (by C-G formula based on SCr of 0.55 mg/dL). Liver Function Tests: Recent Labs  Lab 03/05/19 1810 03/06/19 0039 03/08/19 0318 03/09/19 0840  AST 19 13* 14* 18  ALT 18 6 6 9   ALKPHOS 75 66 83 72  BILITOT 0.6 0.9 0.6 0.7  PROT 6.7 6.0* 5.8* 6.1*  ALBUMIN 3.4* 2.7* 2.2* 2.5*   No results for input(s): LIPASE, AMYLASE  in the last 168 hours. No results for input(s): AMMONIA in the last 168 hours. Coagulation Profile: No results for input(s): INR, PROTIME in the last 168 hours. Cardiac Enzymes: No results for input(s): CKTOTAL, CKMB, CKMBINDEX, TROPONINI in the last 168 hours. BNP (last 3 results) No results for input(s): PROBNP in the last 8760 hours. HbA1C: No results for input(s): HGBA1C in the last 72 hours. CBG: Recent Labs  Lab 03/08/19 1053 03/08/19 1605 03/08/19 2113 03/09/19 0753 03/09/19 1212  GLUCAP 171* 88 124* 120* 138*   Lipid Profile: No results for input(s): CHOL, HDL, LDLCALC, TRIG, CHOLHDL, LDLDIRECT in the last 72 hours. Thyroid Function Tests: No results for input(s): TSH, T4TOTAL, FREET4, T3FREE, THYROIDAB in the last 72 hours. Anemia Panel: No results for input(s): VITAMINB12, FOLATE, FERRITIN, TIBC, IRON, RETICCTPCT in the last 72 hours. Sepsis Labs: Recent Labs  Lab 03/05/19 1810 03/06/19 0039  LATICACIDVEN 2.4* 1.0    Recent Results (from the past 240 hour(s))  Blood Culture (routine x 2)     Status: None (Preliminary result)   Collection Time: 03/05/19  6:10 PM  Result Value Ref Range Status   Specimen Description BLOOD RIGHT FOREARM  Final   Special Requests   Final    BOTTLES DRAWN AEROBIC AND  ANAEROBIC Blood Culture adequate volume   Culture   Final    NO GROWTH 4 DAYS Performed at [facility], [address] [place], [place] [address]    Report Status PENDING  Incomplete  Blood Culture (routine x 2)     Status: None (Preliminary result)   Collection Time: 03/05/19  6:39 PM  Result Value Ref Range Status   Specimen Description BLOOD LEFT HAND  Final   Special Requests   Final    BOTTLES DRAWN AEROBIC AND ANAEROBIC Blood Culture adequate volume   Culture   Final    NO GROWTH 4 DAYS Performed at [facility], [place] [address], [place], [place] [address]    Report Status PENDING  Incomplete  Urine culture     Status: Abnormal   Collection Time: 03/05/19  8:30 PM  Result Value Ref Range Status   Specimen Description URINE, CLEAN CATCH  Final   Special Requests   Final    NONE Performed at [facility], [place] [address], [place],  [address]    Culture MULTIPLE SPECIES PRESENT, SUGGEST RECOLLECTION (A)  Final   Report Status 03/06/2019 FINAL  Final         Radiology Studies: [place] Soft Tissue Non Vascular  Result Date: 03/09/2019 CLINICAL DATA:  Swelling and drainage over the left gluteal region 1 week. Diabetes and obesity with hypertension. EXAM: ULTRASOUND LEFT LOWER EXTREMITY LIMITED TECHNIQUE: Ultrasound examination of the lower extremity soft tissues was performed in the area of clinical concern inferior to the left gluteal region adjacent the midline. COMPARISON:  Pelvic CT 03/05/2019 FINDINGS: Joint Space: Not evaluated. Muscles: Not evaluated. Tendons: Not evaluated. Other Soft Tissue Structures: Ultrasound exam over the area in question inferior to the left gluteal region adjacent the midline demonstrates moderate subcutaneous edema without focal fluid collection to suggest subcutaneous abscess. Findings may be due to soft tissue infection. IMPRESSION: Moderate subcutaneous edema over the area in question adjacent the midline  inferior to the left gluteal region. No evidence of subcutaneous abscess. Findings may be due to cellulitis without focal abscess. Electronically Signed   By: Marin Olp M.D.   On: 03/09/2019 12:02  Scheduled Meds: . aspirin  81 mg Oral Daily  . carbidopa-levodopa  2 tablet Oral TID  . cholecalciferol  5,000 Units Oral Daily  . escitalopram  15 mg Oral Daily  . feeding supplement (GLUCERNA SHAKE)  237 mL Oral TID BM  . fenofibrate  160 mg Oral q morning - 10a  . heparin  5,000 Units Subcutaneous Q8H  . insulin aspart  0-15 Units Subcutaneous TID WC  . insulin aspart  0-5 Units Subcutaneous QHS  . mupirocin cream   Topical BID  . nystatin   Topical TID   Continuous Infusions: . cefTRIAXone (ROCEPHIN)  IV 1 g (03/08/19 1313)  . vancomycin 1,000 mg (03/08/19 2154)     LOS: 4 days    Time spent: over 30 min    Fayrene Helper, MD [facility] Pager AMION  If 7PM-7AM, please contact night-coverage [email or web address] Password [facility] 03/09/2019, 5:23 PM

## 2019-03-09 NOTE — Consult Note (Addendum)
[facility] Nurse wound consult note Reason for Consult: WOC consult was performed on 4/24; refer to previous consult notes.  Area has declined since that time; requested to reassess.  Ultrasound was just completed which did not show a drainable fluid collection.   Wound type: Left posterior buttock with 2X1cm area of loose yellow slough; fluctuant when probed with a swab; appearance and location are NOT consistent with a pressure injury but might be possible necrotizing fascitis.  Left anterior thigh and groin and left labia are also very tight, painful to touch, and have generalized edema and erythremia. There is another full thickness wound with slough to anterior thigh; 2X2cm Dressing procedure/placement/frequency: Called primary team; recommend surgical consult for possible debridement of nonviable tissue.  CT scan did not indicate significant findings on 4/23 "CT of the pelvis showed concerning for cellulitis extending into her left gluteus area with slight drainage." and may need to be repeated since appearance has declined. Please refer to surgical team for further recommendations.  Please re-consult if further assistance is needed.  Thank-you,  Julien Girt MSN, [place], [place], [place], [place]

## 2019-03-09 NOTE — Progress Notes (Signed)
MASD on both groin, Pt has an unstageable wound area on L buttocks with noted yellowish brown discharge. MD Cephus Slater saw wound area, Ultrasound ordered to assess area. WOC consult ordered per protocol.

## 2019-03-09 NOTE — Progress Notes (Signed)
[facility] Telephone: [phone] Fax: [phone]  PATIENT NAME: [name] [name] DOB: 15-Aug-1951 MRN: [id number]  PRIMARY CARE PROVIDER:   [name], MD  REFERRING PROVIDER:  [name], MD [address] [place], [place] [address]  RESPONSIBLE PARTY:   Self    RECOMMENDATIONS and PLAN:  1. Generalized weakness:  Chronic state without recent falls or hospital visits.   Continue activities with assistance.  Balanced meals and remain hydrated. Monitor for increased weakness.  2.  Dysphagia:  Continue speech therapy.  Aspiration precautions reviewed and encouraged. Monitor.                                                                                                                  3.  Palliative care encounter:  Current code status is DNR.  She desires to continue living at independent living facility with assistance of caregivers and friend. Her goal is to avoid re-hospitalization and maintain current [place] of life. Palliative care will F/U in 2 months aprox.   Due to the COVID-19 crisis and lack of video capability, this visit was performed via telephonic evaluation from my office and it was initiated and consented by this patient and or family.  I spent  20 minutes providing this consultation,  from 11:30am to 11:50am.   More than 50% of the time in this consultation was spent coordinating communication with patient   .  She denies any recent falls, hospitalizations or dysphagia. She still has full-time in home assistance for her ADLs, meals and custodial affairs.  She is chair or bed-bound and is mobile via wheelchiair due to PSP.  CODE STATUS: DNR  PPS: 40% HOSPICE ELIGIBILITY/DIAGNOSIS: TBD  PAST MEDICAL HISTORY:  Past Medical History:  Diagnosis Date  . Acute kidney failure (HCC)    hx of   . Arthritis    fingers,right shoulder  . Bradycardia   . 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 CHF  (congestive heart failure) (HCC)    acute diastolic ( congestive ) heart failure)   . Chronic lumbar radiculopathy    L5- S1  right leg  . Constipation   . Depression   . Diverticulosis large intestine w/o perforation or abscess w/bleeding   . Dizziness   . Dry eye syndrome of bilateral lacrimal glands   . Frequency of urination   . GERD (gastroesophageal reflux disease)   . Hard of hearing   . History of kidney stones 05/12/15   surgery   . HTN (hypertension)   . Hyperlipidemia   . Hypokalemia   . Morbid obesity due to excess calories ([place])   . 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 Numbness and tingling in hands   . OSA (obstructive sleep apnea)    moderate OSA per study 11-22-2007--  refused CPAP but used oxygen for 6 months at night,  states due to  wt loss stopped using oxygen  . Polyneuropathy, diabetic ([place])    WALKS W/ CANE FOR BALANCE  . 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 Pyonephrosis   . Pyonephrosis   . Radiculopathy of lumbar region   . Respiratory failure (HCC)    hx of acute respiratory failure wtih hypoxia   . Right ureteral stone   . Severe sepsis with septic shock (CODE) ([place])   . Shortness of breath dyspnea   . Spinal stenosis, cervical region   . SUI (stress urinary incontinence, female)   . Tubulo-interstitial nephritis   . Type 2 diabetes mellitus (HCC)    Type II - Diet controlled  . Urinary incontinence   . Urinary tract infection   . Wears glasses      PHYSICAL EXAM:   General: Unable to visualize. Resp: No obvious dyspnea noted with speech Ext:  No edema reported by patient.  Remains mobile via WC Neurological: A&O.  Forced speech noted.   Gonzella Lex, NP-C

## 2019-03-09 NOTE — Progress Notes (Signed)
SLP Cancellation Note  Patient Details Name: [name] [name] MRN: [id number] DOB: [birth year]   Cancelled treatment:       Reason Eval/Treat Not Completed: Other (comment) Pt on the bedpan upon SLP arrival, requesting more time. She denies any trouble with swallowing on current diet as lon as she can the foods cut. Will f/u as able.   [name] [name] 03/09/2019, 3:22 PM  [name], M.A. [place] Acute Environmental education officer [phone] Office [phone]

## 2019-03-10 ENCOUNTER — Inpatient Hospital Stay (HOSPITAL_COMMUNITY): Payer: PPO | Admitting: Anesthesiology

## 2019-03-10 ENCOUNTER — Encounter (HOSPITAL_COMMUNITY): Payer: Self-pay

## 2019-03-10 ENCOUNTER — Encounter (HOSPITAL_COMMUNITY)
Admission: EM | Disposition: A | Discharge status: Home Health | Payer: Self-pay | Source: Home / Self Care | Attending: Family Medicine

## 2019-03-10 HISTORY — PX: WOUND DEBRIDEMENT: SHX247

## 2019-03-10 LAB — CBC
HCT: 32.3 % — ABNORMAL LOW (ref 36.0–46.0)
Hemoglobin: 10.7 g/dL — ABNORMAL LOW (ref 12.0–15.0)
MCH: 29.8 pg (ref 26.0–34.0)
MCHC: 33.1 g/dL (ref 30.0–36.0)
MCV: 90 fL (ref 80.0–100.0)
Platelets: 363 10*3/uL (ref 150–400)
RBC: 3.59 MIL/uL — ABNORMAL LOW (ref 3.87–5.11)
RDW: 13.4 % (ref 11.5–15.5)
WBC: 7.5 10*3/uL (ref 4.0–10.5)
nRBC: 0 % (ref 0.0–0.2)

## 2019-03-10 LAB — CULTURE, BLOOD (ROUTINE X 2)
Culture: NO GROWTH
Culture: NO GROWTH
Special Requests: ADEQUATE
Special Requests: ADEQUATE

## 2019-03-10 LAB — GLUCOSE, CAPILLARY
Glucose-Capillary: 108 mg/dL — ABNORMAL HIGH (ref 70–99)
Glucose-Capillary: 116 mg/dL — ABNORMAL HIGH (ref 70–99)
Glucose-Capillary: 139 mg/dL — ABNORMAL HIGH (ref 70–99)
Glucose-Capillary: 208 mg/dL — ABNORMAL HIGH (ref 70–99)
Glucose-Capillary: 161 mg/dL — ABNORMAL HIGH (ref 70–99)

## 2019-03-10 LAB — MRSA PCR SCREENING: MRSA by PCR: POSITIVE — AB

## 2019-03-10 LAB — VANCOMYCIN, PEAK: Vancomycin Pk: 24 ug/mL — ABNORMAL LOW (ref 30–40)

## 2019-03-10 LAB — MAGNESIUM: Magnesium: 1.8 mg/dL (ref 1.7–2.4)

## 2019-03-10 SURGERY — DEBRIDEMENT, WOUND
Anesthesia: General | Site: Perineum | Laterality: Left

## 2019-03-10 MED ORDER — ONDANSETRON HCL 4 MG/2ML IJ SOLN
INTRAMUSCULAR | Status: DC | PRN
  Administered 2019-03-10: 4 mg via INTRAVENOUS

## 2019-03-10 MED ORDER — ARTIFICIAL TEARS OPHTHALMIC OINT
TOPICAL_OINTMENT | OPHTHALMIC | Status: AC
  Filled 2019-03-10: qty 3.5

## 2019-03-10 MED ORDER — ROCURONIUM BROMIDE 50 MG/5ML IV SOSY
PREFILLED_SYRINGE | INTRAVENOUS | Status: AC
  Filled 2019-03-10: qty 5

## 2019-03-10 MED ORDER — FENTANYL CITRATE (PF) 100 MCG/2ML IJ SOLN
INTRAMUSCULAR | Status: DC | PRN
  Administered 2019-03-10: 25 ug via INTRAVENOUS
  Administered 2019-03-10 (×2): 50 ug via INTRAVENOUS
  Administered 2019-03-10: 25 ug via INTRAVENOUS

## 2019-03-10 MED ORDER — MIDAZOLAM HCL 2 MG/2ML IJ SOLN
INTRAMUSCULAR | Status: AC
  Filled 2019-03-10: qty 2

## 2019-03-10 MED ORDER — OXYCODONE HCL 5 MG PO TABS: 5.0000 mg | ORAL_TABLET | Freq: Once | ORAL | Status: DC | PRN

## 2019-03-10 MED ORDER — ONDANSETRON HCL 4 MG/2ML IJ SOLN
INTRAMUSCULAR | Status: AC
  Filled 2019-03-10: qty 2

## 2019-03-10 MED ORDER — PHENYLEPHRINE 40 MCG/ML (10ML) SYRINGE FOR IV PUSH (FOR BLOOD PRESSURE SUPPORT)
PREFILLED_SYRINGE | INTRAVENOUS | Status: AC
  Filled 2019-03-10: qty 10

## 2019-03-10 MED ORDER — CHLORHEXIDINE GLUCONATE CLOTH 2 % EX PADS
6.0000 | MEDICATED_PAD | Freq: Every day | CUTANEOUS | Status: DC
  Administered 2019-03-11 – 2019-03-12 (×2): 6 via TOPICAL

## 2019-03-10 MED ORDER — LACTATED RINGERS IV SOLN
INTRAVENOUS | Status: DC
  Administered 2019-03-10: 09:00:00 via INTRAVENOUS

## 2019-03-10 MED ORDER — 0.9 % SODIUM CHLORIDE (POUR BTL) OPTIME
TOPICAL | Status: DC | PRN
  Administered 2019-03-10: 1000 mL

## 2019-03-10 MED ORDER — FENTANYL CITRATE (PF) 250 MCG/5ML IJ SOLN
INTRAMUSCULAR | Status: AC
  Filled 2019-03-10: qty 5

## 2019-03-10 MED ORDER — DEXAMETHASONE SODIUM PHOSPHATE 10 MG/ML IJ SOLN
INTRAMUSCULAR | Status: AC
  Filled 2019-03-10: qty 2

## 2019-03-10 MED ORDER — FENTANYL CITRATE (PF) 100 MCG/2ML IJ SOLN: 25.0000 ug | INTRAMUSCULAR | Status: DC | PRN

## 2019-03-10 MED ORDER — PROPOFOL 10 MG/ML IV BOLUS
INTRAVENOUS | Status: DC | PRN
  Administered 2019-03-10: 30 mg via INTRAVENOUS
  Administered 2019-03-10: 150 mg via INTRAVENOUS

## 2019-03-10 MED ORDER — PHENYLEPHRINE HCL (PRESSORS) 10 MG/ML IV SOLN
INTRAVENOUS | Status: DC | PRN
  Administered 2019-03-10: 80 ug via INTRAVENOUS

## 2019-03-10 MED ORDER — ONDANSETRON HCL 4 MG/2ML IJ SOLN: 4.0000 mg | Freq: Once | INTRAMUSCULAR | Status: DC | PRN

## 2019-03-10 MED ORDER — LACTATED RINGERS IV SOLN
INTRAVENOUS | Status: DC | PRN
  Administered 2019-03-10: 09:00:00 via INTRAVENOUS

## 2019-03-10 MED ORDER — KETOROLAC TROMETHAMINE 30 MG/ML IJ SOLN
INTRAMUSCULAR | Status: AC
  Filled 2019-03-10: qty 1

## 2019-03-10 MED ORDER — OXYCODONE HCL 5 MG/5ML PO SOLN: 5.0000 mg | Freq: Once | ORAL | Status: DC | PRN

## 2019-03-10 MED ORDER — ONDANSETRON HCL 4 MG/2ML IJ SOLN
INTRAMUSCULAR | Status: AC
  Filled 2019-03-10: qty 4

## 2019-03-10 MED ORDER — PROPOFOL 10 MG/ML IV BOLUS
INTRAVENOUS | Status: AC
  Filled 2019-03-10: qty 20

## 2019-03-10 MED ORDER — LIDOCAINE 2% (20 MG/ML) 5 ML SYRINGE
INTRAMUSCULAR | Status: DC | PRN
  Administered 2019-03-10: 40 mg via INTRAVENOUS

## 2019-03-10 MED ORDER — MUPIROCIN 2 % EX OINT
1.0000 "application " | TOPICAL_OINTMENT | Freq: Two times a day (BID) | CUTANEOUS | Status: DC
  Administered 2019-03-11 – 2019-03-12 (×4): 1 via NASAL
  Filled 2019-03-10: qty 22

## 2019-03-10 MED ORDER — DEXAMETHASONE SODIUM PHOSPHATE 10 MG/ML IJ SOLN
INTRAMUSCULAR | Status: AC
  Filled 2019-03-10: qty 1

## 2019-03-10 MED ORDER — LIDOCAINE 2% (20 MG/ML) 5 ML SYRINGE
INTRAMUSCULAR | Status: AC
  Filled 2019-03-10: qty 10

## 2019-03-10 MED ORDER — PNEUMOCOCCAL VAC POLYVALENT 25 MCG/0.5ML IJ INJ
0.5000 mL | INJECTION | INTRAMUSCULAR | Status: DC
  Filled 2019-03-10: qty 0.5

## 2019-03-10 MED ORDER — DEXAMETHASONE SODIUM PHOSPHATE 10 MG/ML IJ SOLN
INTRAMUSCULAR | Status: DC | PRN
  Administered 2019-03-10: 10 mg via INTRAVENOUS

## 2019-03-10 MED ORDER — SUCCINYLCHOLINE CHLORIDE 200 MG/10ML IV SOSY
PREFILLED_SYRINGE | INTRAVENOUS | Status: AC
  Filled 2019-03-10: qty 20

## 2019-03-10 MED ORDER — LOPERAMIDE HCL 2 MG PO CAPS: 2.0000 mg | ORAL_CAPSULE | ORAL | Status: DC | PRN

## 2019-03-10 SURGICAL SUPPLY — 32 items
BNDG GAUZE ELAST 4 BULKY (GAUZE/BANDAGES/DRESSINGS) ×3 IMPLANT
CANISTER SUCT 3000ML PPV (MISCELLANEOUS) ×3 IMPLANT
COVER SURGICAL LIGHT HANDLE (MISCELLANEOUS) ×3 IMPLANT
COVER WAND RF STERILE (DRAPES) ×3 IMPLANT
DRAPE UTILITY XL STRL (DRAPES) ×6 IMPLANT
DRSG PAD ABDOMINAL 8X10 ST (GAUZE/BANDAGES/DRESSINGS) ×3 IMPLANT
ELECT CAUTERY BLADE 6.4 (BLADE) IMPLANT
ELECT REM PT RETURN 9FT ADLT (ELECTROSURGICAL)
ELECTRODE REM PT RTRN 9FT ADLT (ELECTROSURGICAL) IMPLANT
GAUZE 4X4 16PLY RFD (DISPOSABLE) ×3 IMPLANT
GAUZE PACKING IODOFORM 1 (PACKING) IMPLANT
GAUZE SPONGE 4X4 12PLY STRL (GAUZE/BANDAGES/DRESSINGS) ×3 IMPLANT
GLOVE BIO SURGEON STRL SZ7.5 (GLOVE) ×3 IMPLANT
GOWN STRL REUS W/ TWL LRG LVL3 (GOWN DISPOSABLE) ×2 IMPLANT
GOWN STRL REUS W/TWL LRG LVL3 (GOWN DISPOSABLE) ×4
KIT BASIN OR (CUSTOM PROCEDURE TRAY) ×3 IMPLANT
KIT TURNOVER KIT B (KITS) ×3 IMPLANT
NS IRRIG 1000ML POUR BTL (IV SOLUTION) ×3 IMPLANT
PACK LITHOTOMY IV (CUSTOM PROCEDURE TRAY) ×3 IMPLANT
PAD ABD 8X10 STRL (GAUZE/BANDAGES/DRESSINGS) ×3 IMPLANT
PAD ARMBOARD 7.5X6 YLW CONV (MISCELLANEOUS) ×6 IMPLANT
PENCIL SMOKE EVACUATOR (MISCELLANEOUS) IMPLANT
SPONGE LAP 18X18 RF (DISPOSABLE) ×3 IMPLANT
SWAB COLLECTION DEVICE MRSA (MISCELLANEOUS) ×3 IMPLANT
SWAB CULTURE ESWAB REG 1ML (MISCELLANEOUS) ×3 IMPLANT
TAPE CLOTH SURG 4X10 WHT LF (GAUZE/BANDAGES/DRESSINGS) ×3 IMPLANT
TOWEL OR 17X24 6PK STRL BLUE (TOWEL DISPOSABLE) ×3 IMPLANT
TOWEL OR 17X26 10 PK STRL BLUE (TOWEL DISPOSABLE) ×3 IMPLANT
TUBE CONNECTING 12'X1/4 (SUCTIONS) ×1
TUBE CONNECTING 12X1/4 (SUCTIONS) ×2 IMPLANT
UNDERPAD 30X30 (UNDERPADS AND DIAPERS) ×3 IMPLANT
YANKAUER SUCT BULB TIP NO VENT (SUCTIONS) ×3 IMPLANT

## 2019-03-10 NOTE — Op Note (Signed)
03/10/2019  10:10 AM  PATIENT:  [name] [name]  68 y.o. female  PRE-OPERATIVE DIAGNOSIS:  LEFT PERINEAL ABSCESS  POST-OPERATIVE DIAGNOSIS:  LEFT PERINEAL ABSCESS  PROCEDURE:  Procedure(s): EXCISION AND DEBRIDEMENT LEFT PERINEAL WOUND (Left)  SURGEON:  Surgeon(s) and Role:    * Jovita Kussmaul, MD - Primary  PHYSICIAN ASSISTANT:   ASSISTANTS: none   ANESTHESIA:   general  EBL:  20 mL   BLOOD ADMINISTERED:none  DRAINS: none   LOCAL MEDICATIONS USED:  NONE  SPECIMEN:  No Specimen  DISPOSITION OF SPECIMEN:  N/A  COUNTS:  YES  TOURNIQUET:  * No tourniquets in log *  DICTATION: .Dragon Dictation   After informed consent was obtained the patient was brought to the operating room and placed in the supine position on the operating table.  After adequate induction of general anesthesia the patient was moved into lithotomy position and all pressure points were padded.  The perineal area was prepped with Betadine and draped in the usual sterile manner.  An appropriate timeout was performed.  In the left perineal space there was 2 areas of skin necrosis with drainage.  These areas were probed with a hemostat and in both areas were connected through the subcutaneous tissue.  The overlying skin was incised sharply with the electrocautery.  Some necrotic tissue was debrided sharply in the skin and subcutaneous tissue area with the electrocautery.  I small amount of purulent material was evacuated.  Cultures were obtained.  Hemostasis was achieved using the Bovie electrocautery.  Once this was accomplished the entire abscess cavity was opened and drained.  The tissue planes seem to be intact at this point.  The wound measured approximately 15 cm x 5 cm.  The wound was packed with moistened Kerlix gauze and sterile dressings were applied.  The patient tolerated the procedure well.  At the end of the case all needle sponge and instrument counts were correct.  The patient was then awakened and  taken to recovery in stable condition.  PLAN OF CARE: Admit to inpatient   PATIENT DISPOSITION:  PACU - hemodynamically stable.   Delay start of Pharmacological VTE agent (>24hrs) due to surgical blood loss or risk of bleeding: no

## 2019-03-10 NOTE — TOC Initial Note (Signed)
Transition of Care [facility]) - Initial/Assessment Note    Patient Details  Name: [name] [name] MRN: [id number] Date of Birth: 1951/05/31  Transition of Care [facility]) CM/SW Contact:    Bethena Roys, RN Phone Number: 03/10/2019, 3:46 PM  Clinical Narrative: Pt presented for weakness and cellulitis. PTA from IDL facility at [facility] in [facility]. Pt plans to return to [place] with [place]. She has used [place] in the past and wants to continue. Medicare .gov list provided. CM did call Tommi Rumps with [facility] for referral. SOC to begin within 24-48 hours post transition home. Patient states she currently has a friend that visits daily from 7 am- 7 pm to assist her in the home. PT will continue to consult. Patient post I&D to Left perineal abscess 03-10-19. CM will continue to monitor for additional transition of care needs.               Expected Discharge Plan: [place] Services(From IDL Facility at the [facility] in [facility]. ) Barriers to Discharge: Continued Medical Work up   Patient Goals and CMS Choice   CMS Medicare.gov Compare Post Acute Care list provided to:: Patient Choice offered to / list presented to : Patient  Expected Discharge Plan and Services Expected Discharge Plan: [place] Services(From IDL Facility at the [facility] in [facility]. ) In-house Referral: NA Discharge Planning Services: CM Consult Post Acute Care Choice: [place] arrangements for the past 2 months: [place]: PT [place]: Amber Date [place]: 03/10/19 Time [place]: [id number] Representative spoke with at [place]: Tommi Rumps  Prior Living Arrangements/Services Living arrangements for the past 2 months: [place] Lives with:: Self Patient language and need for interpreter reviewed:: Yes Do you feel safe going back to the place where you live?: Yes       Need for Family Participation in Patient Care: No (Comment) Care giver support system in place?: No (comment) Current home services: Other (comment)(Friend that comes in from 7am -7 pm that assists her. ) Criminal Activity/Legal Involvement Pertinent to Current Situation/Hospitalization: No - Comment as needed  Activities of Daily Living Home Assistive Devices/Equipment: Cane (specify quad or straight)(straight) ADL Screening (condition at time of admission) Patient's cognitive ability adequate to safely complete daily activities?: No Is the patient deaf or have difficulty hearing?: No Does the patient have difficulty seeing, even when wearing glasses/contacts?: No Does the patient have difficulty concentrating, remembering, or making decisions?: No Patient able to express need for assistance with ADLs?: Yes Does the patient have difficulty dressing or bathing?: Yes Independently performs ADLs?: No Communication: Independent Dressing (OT): Needs assistance Is this a change from baseline?: Pre-admission baseline Grooming: Needs assistance Is this a change from baseline?: Pre-admission baseline Feeding: Needs assistance Is this a change from baseline?: Pre-admission baseline Bathing: Needs assistance Is this a change from baseline?: Pre-admission baseline Toileting: Needs assistance Is this a change from baseline?: Pre-admission baseline In/Out Bed: Needs assistance Is this a change from baseline?: Pre-admission baseline Walks in Home: Needs assistance Is this a change from baseline?: Pre-admission baseline Does the patient have difficulty walking or climbing stairs?: Yes Weakness of Legs: Right Weakness of Arms/Hands: Right  Permission Sought/Granted Permission sought to share information with : Case Manager  Emotional Assessment Appearance:: Appears stated age Attitude/Demeanor/Rapport: Engaged Affect (typically observed): Accepting Orientation: :  Oriented to Self, Oriented to Situation, Oriented to Place, Oriented to  Time Alcohol / Substance Use: Not Applicable Psych Involvement: No (comment)  Admission diagnosis:  Acute female pelvic cellulitis [N73.0] Sepsis, due to unspecified organism, unspecified whether acute organ dysfunction present [facility]) [A41.9] Patient Active Problem List   Diagnosis Date Noted  . Palliative care encounter 03/09/2019  . Pressure injury of skin 03/06/2019  . Sepsis ([place]) 03/05/2019  . Cellulitis of buttock, left 03/05/2019  . Bradycardia 04/01/2018  . UTI (urinary tract infection) 04/01/2018  . ARF (acute renal failure) ([place]) 04/01/2018  . Right ureteral stone 04/01/2018  . Acute diastolic heart failure ([place]) 04/01/2018  . Acute respiratory failure with hypoxia ([place])   . Pyelonephritis   . Severe sepsis ([place])   . Pyohydronephrosis 03/29/2018  . Septic shock ([place]) 03/29/2018  . Hypokalemia 03/28/2018  . Severe sepsis with septic shock ([place]) 03/28/2018  . Elevated lactic acid level 03/28/2018  . Elevated troponin 03/28/2018  . PSP (progressive supranuclear palsy) ([place]) 02/18/2018  . Chronic cough 11/19/2017  . 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 Cervical stenosis of spinal canal 03/23/2016  . OSA on CPAP 12/14/2015  . Dizziness and giddiness 11/24/2013  . Diabetes type 2, uncontrolled ([place]) 11/24/2013  . Severe obesity (BMI >= 40) ([place]) 11/24/2013  . Lumbar radiculopathy 11/24/2013   PCP:  [name], MD Pharmacy:   [id number] [place], [address] [place] [address] Phone: [phone] Fax: [phone]     Social Determinants of Health (SDOH) Interventions    Readmission Risk Interventions No flowsheet data found.

## 2019-03-10 NOTE — Care Management Important Message (Signed)
Important Message  Patient Details  Name: [name] [name] MRN: [id number] Date of Birth: 1951/07/03   Medicare Important Message Given:  Yes    [name] [name] 03/10/2019, 3:36 PM

## 2019-03-10 NOTE — Anesthesia Procedure Notes (Signed)
Procedure Name: Intubation Date/Time: 03/10/2019 9:37 AM Performed by: Neldon [place], CRNA Pre-anesthesia Checklist: Timeout performed, Patient being monitored, Suction available, Emergency Drugs available and Patient identified Patient Re-evaluated:Patient Re-evaluated prior to induction Oxygen Delivery Method: Circle system utilized Preoxygenation: Pre-oxygenation with 100% oxygen Induction Type: IV induction LMA: LMA inserted LMA Size: 4.0 Placement Confirmation: breath sounds checked- equal and bilateral and positive ETCO2 Tube secured with: Tape Dental Injury: Teeth and Oropharynx as per pre-operative assessment

## 2019-03-10 NOTE — Transfer of Care (Signed)
Immediate Anesthesia Transfer of Care Note  Patient: [name] [name]  Procedure(s) Performed: EXCISION AND DEBRIDEMENT LEFT PERINEAL WOUND (Left Perineum)  Patient Location: PACU  Anesthesia Type:General  Level of Consciousness: awake, alert  and oriented  Airway & Oxygen Therapy: Patient Spontanous Breathing and Patient connected to face mask oxygen  Post-op Assessment: Report given to RN, Post -op Vital signs reviewed and stable and Patient moving all extremities X 4  Post vital signs: Reviewed and stable  Last Vitals:  Vitals Value Taken Time  BP 132/62 03/10/2019 10:18 AM  Temp    Pulse 64 03/10/2019 10:19 AM  Resp 17 03/10/2019 10:19 AM  SpO2 100 % 03/10/2019 10:19 AM  Vitals shown include unvalidated device data.  Last Pain:  Vitals:   03/10/19 0744  TempSrc: Oral  PainSc:       Patients Stated Pain Goal: 0 (55/25/89 4834)  Complications: No apparent anesthesia complications

## 2019-03-10 NOTE — Progress Notes (Signed)
Report received from [place], [place] 6E.

## 2019-03-10 NOTE — Progress Notes (Signed)
Occupational Therapy Treatment Patient Details Name: [name] [name] MRN: [id number] DOB: December 24, 1950 Today's Date: 03/10/2019    History of present illness Pt is a 68 y.o. female admitted from [facility] 03/05/19 with progressive decline and weakness over past several days. Worked up for sepsis secondary to acute cellulitis of L buttock wound. CXR without acute infection. PMH includes progressive Parksinson's, progressive supranuclear palsy, DM2, HTN, OSA, CHF.    OT comments  Pt pleasant and cooperative.  Requires mod assist +2 for bed mobility, mod assist +2 for sit to stand using stedy for basic transfers (simulated toilet transfers).  Maintained standing in stedy with min assist for safety, good tolerance in standing.  DC plan remains appropriate. Will follow.      Follow Up Recommendations  Home health OT;Supervision/Assistance - 24 hour    Equipment Recommendations  None recommended by OT    Recommendations for Other Services      Precautions / Restrictions Precautions Precautions: Fall Precaution Comments: Sacral and perineal wounds Restrictions Weight Bearing Restrictions: No       Mobility Bed Mobility Overal bed mobility: Needs Assistance Bed Mobility: Supine to Sit;Sidelying to Sit   Sidelying to sit: +2 for physical assistance;Mod assist       General bed mobility comments: modAx2 for LE off bed and trunk to upright. Pt able to initiate LE movement to EoB and reach to bed rail to come on side, vc for release of bed rail and modAx2 to come to sitting EoB  Transfers Overall transfer level: Needs assistance   Transfers: Sit to/from Stand Sit to Stand: Mod assist;+2 physical assistance         General transfer comment: modAx2 for power up from bed to standing in [place], pt remained in standing for moving to recliner due to increased perneal pain from surgery this am, modAx2 for lowering into recliner    Balance Overall balance assessment: Needs assistance    Sitting balance-Leahy Scale: Poor Sitting balance - Comments: Able to maintain sitting balance with intermittent min guard, easily fatigued requiring min-maxA   Standing balance support: Bilateral upper extremity supported;During functional activity Standing balance-Leahy Scale: Poor Standing balance comment: relaint on BUE support                           ADL either performed or assessed with clinical judgement   ADL Overall ADL's : Needs assistance/impaired                         Toilet Transfer: Moderate assistance;+2 for physical assistance;+2 for safety/equipment Toilet Transfer Details (indicate cue type and reason): using stedy, simulated to recliner                  Vision       Perception     Praxis      Cognition Arousal/Alertness: Awake/alert Behavior During Therapy: WFL for tasks assessed/performed Overall Cognitive Status: Within Functional Limits for tasks assessed                                 General Comments: Good insight into progressive condition        Exercises General Exercises - Lower Extremity Hip Flexion/Marching: AROM;Both;10 reps;Seated   Shoulder Instructions       General Comments VSS    Pertinent Vitals/ Pain       Pain Assessment:  0-10 Pain Score: 5  Pain Location: buttock Pain Descriptors / Indicators: Discomfort;Grimacing Pain Intervention(s): Limited activity within patient's tolerance;Repositioned;Monitored during session  Home Living                                          Prior Functioning/Environment              Frequency  Min 2X/week        Progress Toward Goals  OT Goals(current goals can now be found in the care plan section)  Progress towards OT goals: Progressing toward goals  Acute Rehab OT Goals Patient Stated Goal: to go home OT Goal Formulation: With patient  Plan Discharge plan remains appropriate;Frequency remains appropriate     Co-evaluation    PT/OT/SLP Co-Evaluation/Treatment: Yes Reason for Co-Treatment: To address functional/ADL transfers;For patient/therapist safety   OT goals addressed during session: ADL's and self-care      AM-PAC OT "6 Clicks" Daily Activity     Outcome Measure   Help from another person eating meals?: None Help from another person taking care of personal grooming?: A Little Help from another person toileting, which includes using toliet, bedpan, or urinal?: Total Help from another person bathing (including washing, rinsing, drying)?: A Lot Help from another person to put on and taking off regular upper body clothing?: A Little Help from another person to put on and taking off regular lower body clothing?: Total 6 Click Score: 14    End of Session    OT Visit Diagnosis: Unsteadiness on feet (R26.81);Muscle weakness (generalized) (M62.81)   Activity Tolerance Patient tolerated treatment well   Patient Left in chair;with call bell/phone within reach;with chair alarm set   Nurse Communication Mobility status        Time: 0981-1914 OT Time Calculation (min): 30 min  Charges: OT General Charges $OT Visit: 1 Visit OT Treatments $Self Care/Home Management : 8-22 mins  Delight Stare, [place] Pager [phone] Office [phone]    Delight Stare 03/10/2019, 4:53 PM

## 2019-03-10 NOTE — Progress Notes (Signed)
Physical Therapy Treatment Patient Details Name: [name] [name] MRN: [id number] DOB: [birth year] Today's Date: 03/10/2019    History of Present Illness Pt is a 68 y.o. female admitted from [facility] 03/05/19 with progressive decline and weakness over past several days. Worked up for sepsis secondary to acute cellulitis of L buttock wound. CXR without acute infection. PMH includes progressive Parksinson's, progressive supranuclear palsy, DM2, HTN, OSA, CHF.     PT Comments    Pt agreeable to therapy this afternoon, stating that the surgery went well. Pt requires modAx2 for bed mobility, and sit>stand transfer to [place]. Pt impressed with her performance stating she has stood up since December. D/c plans remain appropriate at this time to continue to progress pt mobility. PT will continue to follow acutely.   Follow Up Recommendations  Home health PT;Supervision for mobility/OOB     Equipment Recommendations  None recommended by PT       Precautions / Restrictions Precautions Precautions: Fall Precaution Comments: Sacral and perineal wounds Restrictions Weight Bearing Restrictions: No    Mobility  Bed Mobility Overal bed mobility: Needs Assistance Bed Mobility: Supine to Sit;Sidelying to Sit   Sidelying to sit: +2 for physical assistance;Mod assist       General bed mobility comments: modAx2 for LE off bed and trunk to upright. Pt able to initiate LE movement to EoB and reach to bed rail to come on side, vc for release of bed rail and modAx2 to come to sitting EoB  Transfers Overall transfer level: Needs assistance   Transfers: Sit to/from Stand Sit to Stand: Mod assist;+2 physical assistance         General transfer comment: modAx2 for power up from bed to standing in [place], pt remained in standing for moving to recliner due to increased perneal pain from surgery this am, modAx2 for lowering into recliner      Balance Overall balance assessment: Needs assistance    Sitting balance-Leahy Scale: Poor Sitting balance - Comments: Able to maintain sitting balance with intermittent min guard, easily fatigued requiring min-maxA                                    Cognition Arousal/Alertness: Awake/alert Behavior During Therapy: WFL for tasks assessed/performed Overall Cognitive Status: Within Functional Limits for tasks assessed                                 General Comments: Good insight into progressive condition      Exercises General Exercises - Lower Extremity Hip Flexion/Marching: AROM;Both;10 reps;Seated    General Comments General comments (skin integrity, edema, etc.): VSS      Pertinent Vitals/Pain Pain Assessment: 0-10 Pain Score: 5  Pain Location: buttock Pain Descriptors / Indicators: Discomfort;Grimacing Pain Intervention(s): Limited activity within patient's tolerance;Monitored during session;Repositioned           PT Goals (current goals can now be found in the care plan section) Acute Rehab PT Goals Patient Stated Goal: to go home PT Goal Formulation: With patient Time For Goal Achievement: 03/21/19 Potential to Achieve Goals: Good Progress towards PT goals: Progressing toward goals    Frequency    Min 3X/week      PT Plan Current plan remains appropriate       AM-PAC PT "6 Clicks" Mobility   Outcome Measure  Help needed turning from your  back to your side while in a flat bed without using bedrails?: A Lot Help needed moving from lying on your back to sitting on the side of a flat bed without using bedrails?: A Lot Help needed moving to and from a bed to a chair (including a wheelchair)?: Total Help needed standing up from a chair using your arms (e.g., wheelchair or bedside chair)?: Total Help needed to walk in hospital room?: Total Help needed climbing 3-5 steps with a railing? : Total 6 Click Score: 8    End of Session Equipment Utilized During Treatment: Gait  belt Activity Tolerance: Patient tolerated treatment well;Patient limited by fatigue Patient left: in bed;with call bell/phone within reach;with bed alarm set Nurse Communication: Mobility status;Need for lift equipment PT Visit Diagnosis: Other abnormalities of gait and mobility (R26.89)     Time: 6606-3016 PT Time Calculation (min) (ACUTE ONLY): 28 min  Charges:  $Therapeutic Activity: 8-22 mins                     Mettie B. Migdalia Dk PT, DPT Acute Rehabilitation Services Pager [phone] Office [phone]    [place] 03/10/2019, 4:36 PM

## 2019-03-10 NOTE — Progress Notes (Signed)
PT Cancellation Note  Patient Details Name: [name] [name] MRN: [id number] DOB: 04-05-1951   Cancelled Treatment:    Reason Eval/Treat Not Completed: (P) Patient at procedure or test/unavailable Pt is off floor for surgical I&D of abscess. PT will follow back as able.  Paytyn B. Migdalia Dk PT, DPT Acute Rehabilitation Services Pager [phone] Office [phone]   [place] 03/10/2019, 8:22 AM

## 2019-03-10 NOTE — Progress Notes (Signed)
PROGRESS NOTE    [name] [name]  AOZ:[id number] DOB: May 17, 1951 DOA: 03/05/2019 PCP: [name], MD   Brief Narrative: Per HPI:  68 y.o.femalewith medical history significant ofdepression, obstructive sleep apnea, diabetes mellitus type 2, essential hypertension, diastolic CHF, hyperlipidemia was brought to the hospital for evaluation of weakness for 2 days and sliding of her chair onto the floor. She is also had some subjective fevers and chills with concerns for infection in her sacrum area of her pressure ulcer. Patient states she has been feeling very weak and has remained pretty much bedbound over the last couple of days. Typically she uses walker to get around.She gets home health who comes in daily. In the ER patient was noted to have relative leukocytosis, febrile and tachycardia. CT of the pelvis showed concerning for cellulitis extending into her left gluteus area with slight drainage. She was started on IV Rocephin and vancomycin. Medical team requested to admit the patient. Patient denies any respiratory symptoms including shortness of breath, cough. Denies any sick contacts including concerns for COVID. Patient was admitted and  being managed for left buttock cellulitis and wound.  Patient did not improve well, seen by surgery 4/27 underwent debridement of the Left perineal abscess 4/28  Subjective: Status post debridement this morning. Afebrile overnight, WBC count resolved to 7.5 from 16.5 Patient is trying to a treadmill.  Somewhat slow to respond alert awake  Assessment & Plan:   Sepsis 2/2 left buttock cellulitis/perineal abscess POA: Status post debridement this morning after surgery were consulted on 4/27 due to lack of improvement.  Continue on ceftriaxone/vancomycin.  Leukocytosis resolved.  Patient is afebrile.  OR wound culture sent appreciated.  Surgery input.  Continue dressing per wound care. Cont bowel regimen, supportive care.  Candida  intertrigo continue nystatin.  Diabetes type 2, controlled  Current hba1c stable 5.7.  Continue SSI.  Weakness/deconditioning continue PT OT.  History of Parkinson disease on Sinemet, Lexapro  Essential hypertension: stable, ARB is on hold  Lumbar radiculopathy: stable  OSA on CPAP  DVT prophylaxis:Heparin Code Status: full Family Communication: son was udpated 4/27 by Dr Karleen Hampshire- I called and update son over the phone today.  Patient disposition Plan: remains inpatient pending clinical improvement. PT/OT suggesting HH/supervision for mobility.  Patient reports she ha someone at home and would like to return home upon discharge  Consultants:  General surgery  Procedures: CT:"inflammation in the posteromedial L thigh extending to the gluteal fold and to the L inguinal intertriginous fold, more compatible with cellulitis than decubitus ulcer"   Antimicrobials: Anti-infectives (From admission, onward)   Start     Dose/Rate Route Frequency Ordered Stop   03/06/19 1900  vancomycin (VANCOCIN) IVPB 1000 mg/200 mL premix     1,000 mg 200 mL/hr over 60 Minutes Intravenous Every 24 hours 03/05/19 1918     03/06/19 1800  cefTRIAXone (ROCEPHIN) 1 g in sodium chloride 0.9 % 100 mL IVPB     1 g 200 mL/hr over 30 Minutes Intravenous Every 24 hours 03/06/19 1042     03/05/19 2145  vancomycin (VANCOCIN) IVPB 1000 mg/200 mL premix  Status:  Discontinued     1,000 mg 200 mL/hr over 60 Minutes Intravenous  Once 03/05/19 2144 03/05/19 2146   03/05/19 1815  vancomycin (VANCOCIN) IVPB 1000 mg/200 mL premix  Status:  Discontinued     1,000 mg 200 mL/hr over 60 Minutes Intravenous  Once 03/05/19 1802 03/05/19 1805   03/05/19 1815  cefTRIAXone (ROCEPHIN) 2 g in  sodium chloride 0.9 % 100 mL IVPB     2 g 200 mL/hr over 30 Minutes Intravenous  Once 03/05/19 1802 03/05/19 1858   03/05/19 1815  vancomycin (VANCOCIN) 1,500 mg in sodium chloride 0.9 % 500 mL IVPB     1,500 mg 250 mL/hr over 120 Minutes  Intravenous  Once 03/05/19 1805 03/05/19 2113       Objective: Vitals:   03/10/19 1018 03/10/19 1033 03/10/19 1048 03/10/19 1227  BP: 132/62 134/64 (!) 149/70 (!) 151/68  Pulse: 67 63 67 70  Resp: 13 19 16  (!) 22  Temp: (!) 97.2 F (36.2 C)  (!) 97.3 F (36.3 C) 97.7 F (36.5 C)  TempSrc:    Oral  SpO2: 97% 100% 100% 94%  Weight:      Height:        Intake/Output Summary (Last 24 hours) at 03/10/2019 1234 Last data filed at 03/10/2019 1001 Gross per 24 hour  Intake 1448 ml  Output 1520 ml  Net -72 ml   Filed Weights   03/08/19 0403 03/09/19 0419 03/10/19 0502  Weight: 87.7 kg 88.9 kg 87.6 kg   Weight change: -1.3 kg  Body mass index is 35.9 kg/m.  Intake/Output from previous day: 04/27 0701 - 04/28 0700 In: 1640 [P.O.:1440; IV Piggyback:200] Out: 1500 [Urine:1500] Intake/Output this shift: Total I/O In: 768 [I.V.:768] Out: 20 [Blood:20]  Examination:  General exam: Appears calm and comfortable,Not in distress, older fore the age HEENT:PERRL,Oral mucosa moist, Ear/Nose normal on gross exam Respiratory system: Bilateral equal air entry, normal vesicular breath sounds, no wheezes or crackles  Cardiovascular system: S1 & S2 heard,No JVD, murmurs. Gastrointestinal system: Abdomen is  soft, non tender, non distended, BS +  Nervous System:Alert and oriented.  Able to move all her extremities.   Extremities: No edema, no clubbing, distal peripheral pulses palpable. Skin: No rashes, lesions, no icterus. Left buttock ULCER -debrided today MSK: Normal muscle bulk,tone ,power  Medications:  Scheduled Meds: . aspirin  81 mg Oral Daily  . carbidopa-levodopa  2 tablet Oral TID  . cholecalciferol  5,000 Units Oral Daily  . escitalopram  15 mg Oral Daily  . feeding supplement (GLUCERNA SHAKE)  237 mL Oral TID BM  . fenofibrate  160 mg Oral q morning - 10a  . heparin  5,000 Units Subcutaneous Q8H  . insulin aspart  0-15 Units Subcutaneous TID WC  . insulin aspart  0-5  Units Subcutaneous QHS  . mupirocin cream   Topical BID  . nystatin   Topical TID   Continuous Infusions: . cefTRIAXone (ROCEPHIN)  IV 1 g (03/09/19 1800)  . lactated ringers 10 mL/hr at 03/10/19 0830  . vancomycin 1,000 mg (03/09/19 2140)    Data Reviewed: I have personally reviewed following labs and imaging studies  CBC: Recent Labs  Lab 03/05/19 1810 03/06/19 0039 03/07/19 0422 03/08/19 0318 03/09/19 0840 03/10/19 0641  WBC 13.2* 11.6* 15.5* 16.5* 9.4 7.5  NEUTROABS 11.3* 9.6*  --   --   --   --   HGB 11.5* 10.5* 9.8* 10.0* 11.0* 10.7*  HCT 35.7* 33.2* 30.1* 30.4* 33.2* 32.3*  MCV 92.5 91.5 91.5 89.9 89.5 90.0  PLT 238 213 218 260 341 161   Basic Metabolic Panel: Recent Labs  Lab 03/06/19 0039 03/07/19 0422 03/08/19 0318 03/09/19 0840 03/09/19 2114 03/10/19 0333  NA 142 140 139 140 138  --   K 3.0* 3.6 3.5 3.8 3.5  --   CL 111 110 107 105 101  --  CO2 22 19* 24 26 32  --   GLUCOSE 127* 115* 110* 127* 135*  --   BUN 11 18 12 11 10   --   CREATININE 0.61 0.70 0.57 0.55 0.58  --   CALCIUM 8.7* 8.9 9.0 8.9 9.1  --   MG 1.4* 1.7 1.6* 1.8  --  1.8   GFR: Estimated Creatinine Clearance: 69.4 mL/min (by C-G formula based on SCr of 0.58 mg/dL). Liver Function Tests: Recent Labs  Lab 03/05/19 1810 03/06/19 0039 03/08/19 0318 03/09/19 0840 03/09/19 2114  AST 19 13* 14* 18 15  ALT 18 6 6 9  <5  ALKPHOS 75 66 83 72 67  BILITOT 0.6 0.9 0.6 0.7 0.6  PROT 6.7 6.0* 5.8* 6.1* 5.8*  ALBUMIN 3.4* 2.7* 2.2* 2.5* 2.4*   No results for input(s): LIPASE, AMYLASE in the last 168 hours. No results for input(s): AMMONIA in the last 168 hours. Coagulation Profile: No results for input(s): INR, PROTIME in the last 168 hours. Cardiac Enzymes: No results for input(s): CKTOTAL, CKMB, CKMBINDEX, TROPONINI in the last 168 hours. BNP (last 3 results) No results for input(s): PROBNP in the last 8760 hours. HbA1C: No results for input(s): HGBA1C in the last 72 hours. CBG:  Recent Labs  Lab 03/09/19 1212 03/09/19 1706 03/09/19 2117 03/10/19 0742 03/10/19 1022  GLUCAP 138* 107* 125* 108* 116*   Lipid Profile: No results for input(s): CHOL, HDL, LDLCALC, TRIG, CHOLHDL, LDLDIRECT in the last 72 hours. Thyroid Function Tests: No results for input(s): TSH, T4TOTAL, FREET4, T3FREE, THYROIDAB in the last 72 hours. Anemia Panel: No results for input(s): VITAMINB12, FOLATE, FERRITIN, TIBC, IRON, RETICCTPCT in the last 72 hours. Sepsis Labs: Recent Labs  Lab 03/05/19 1810 03/06/19 0039  LATICACIDVEN 2.4* 1.0    Recent Results (from the past 240 hour(s))  Blood Culture (routine x 2)     Status: None   Collection Time: 03/05/19  6:10 PM  Result Value Ref Range Status   Specimen Description BLOOD RIGHT FOREARM  Final   Special Requests   Final    BOTTLES DRAWN AEROBIC AND ANAEROBIC Blood Culture adequate volume   Culture   Final    NO GROWTH 5 DAYS Performed at [facility], [address], [place], [place] [address]    Report Status 03/10/2019 FINAL  Final  Blood Culture (routine x 2)     Status: None   Collection Time: 03/05/19  6:39 PM  Result Value Ref Range Status   Specimen Description BLOOD LEFT HAND  Final   Special Requests   Final    BOTTLES DRAWN AEROBIC AND ANAEROBIC Blood Culture adequate volume   Culture   Final    NO GROWTH 5 DAYS Performed at [facility], [place] [address], [place], [place] [address]    Report Status 03/10/2019 FINAL  Final  Urine culture     Status: Abnormal   Collection Time: 03/05/19  8:30 PM  Result Value Ref Range Status   Specimen Description URINE, CLEAN CATCH  Final   Special Requests   Final    NONE Performed at [facility], [place] [address], [place], [place] [address]    Culture MULTIPLE SPECIES PRESENT, SUGGEST RECOLLECTION (A)  Final   Report Status 03/06/2019 FINAL  Final  Anaerobic culture     Status: None (Preliminary result)   Collection Time: 03/10/19 10:13 AM  Result Value  Ref Range Status   Specimen Description ABSCESS PERINEAL  Final  Special Requests PATIENT ON FOLLOWING ROCEPHIN AND VANC  Final   Gram Stain   Final    RARE WBC PRESENT, PREDOMINANTLY PMN RARE GRAM POSITIVE COCCI IN PAIRS Performed at [facility], [place] [address], [place], [place] [address]    Culture PENDING  Incomplete   Report Status PENDING  Incomplete      Radiology Studies: [place] Soft Tissue Non Vascular  Result Date: 03/09/2019 CLINICAL DATA:  Swelling and drainage over the left gluteal region 1 week. Diabetes and obesity with hypertension. EXAM: ULTRASOUND LEFT LOWER EXTREMITY LIMITED TECHNIQUE: Ultrasound examination of the lower extremity soft tissues was performed in the area of clinical concern inferior to the left gluteal region adjacent the midline. COMPARISON:  Pelvic CT 03/05/2019 FINDINGS: Joint Space: Not evaluated. Muscles: Not evaluated. Tendons: Not evaluated. Other Soft Tissue Structures: Ultrasound exam over the area in question inferior to the left gluteal region adjacent the midline demonstrates moderate subcutaneous edema without focal fluid collection to suggest subcutaneous abscess. Findings may be due to soft tissue infection. IMPRESSION: Moderate subcutaneous edema over the area in question adjacent the midline inferior to the left gluteal region. No evidence of subcutaneous abscess. Findings may be due to cellulitis without focal abscess. Electronically Signed   By: Marin Olp M.D.   On: 03/09/2019 12:02      LOS: 5 days   Time spent: More than 50% of that time was spent in counseling and/or coordination of care.  Antonieta Pert, MD [facility]  03/10/2019, 12:34 PM

## 2019-03-11 ENCOUNTER — Encounter (HOSPITAL_COMMUNITY): Payer: Self-pay | Admitting: General Surgery

## 2019-03-11 ENCOUNTER — Ambulatory Visit: Payer: PPO | Admitting: Adult Health

## 2019-03-11 DIAGNOSIS — L89309 Pressure ulcer of unspecified buttock, unspecified stage: Secondary | ICD-10-CM

## 2019-03-11 DIAGNOSIS — L0291 Cutaneous abscess, unspecified: Secondary | ICD-10-CM

## 2019-03-11 LAB — CBC
HCT: 30.5 % — ABNORMAL LOW (ref 36.0–46.0)
Hemoglobin: 10 g/dL — ABNORMAL LOW (ref 12.0–15.0)
MCH: 29.7 pg (ref 26.0–34.0)
MCHC: 32.8 g/dL (ref 30.0–36.0)
MCV: 90.5 fL (ref 80.0–100.0)
Platelets: 430 10*3/uL — ABNORMAL HIGH (ref 150–400)
RBC: 3.37 MIL/uL — ABNORMAL LOW (ref 3.87–5.11)
RDW: 13.2 % (ref 11.5–15.5)
WBC: 10.4 10*3/uL (ref 4.0–10.5)
nRBC: 0 % (ref 0.0–0.2)

## 2019-03-11 LAB — GLUCOSE, CAPILLARY
Glucose-Capillary: 101 mg/dL — ABNORMAL HIGH (ref 70–99)
Glucose-Capillary: 121 mg/dL — ABNORMAL HIGH (ref 70–99)
Glucose-Capillary: 163 mg/dL — ABNORMAL HIGH (ref 70–99)
Glucose-Capillary: 148 mg/dL — ABNORMAL HIGH (ref 70–99)

## 2019-03-11 LAB — VANCOMYCIN, TROUGH: Vancomycin Tr: 7 ug/mL — ABNORMAL LOW (ref 15–20)

## 2019-03-11 MED ORDER — VANCOMYCIN HCL 10 G IV SOLR
1250.0000 mg | INTRAVENOUS | Status: DC
  Filled 2019-03-11: qty 1250

## 2019-03-11 MED ORDER — MORPHINE SULFATE (PF) 2 MG/ML IV SOLN
2.0000 mg | INTRAVENOUS | Status: DC | PRN
  Administered 2019-03-11: 10:00:00 4 mg via INTRAVENOUS
  Filled 2019-03-11: qty 2

## 2019-03-11 NOTE — Progress Notes (Signed)
Pharmacy Antibiotic Note  [name] [name] is a 68 y.o. female  with L perineal cellulitis/abcess s/p debridement on 4/28   Pharmacy has been consulted for vancomycin dosing. Ceftriaxone per MD dosing. -vancomycin peak= 24, trough= 7;  calculated AUC= 340   Plan: -Change vancomycin to 1250mg  IV q24h (calculated AUC= 425) -Will follow renal function, cultures and clinical progress   Height: 5' 1.5" (156.2 cm) Weight: 189 lb 13.1 oz (86.1 kg) IBW/kg (Calculated) : 48.95  Temp (24hrs), Avg:97.9 F (36.6 C), Min:97.6 F (36.4 C), Max:98.1 F (36.7 C)  Recent Labs  Lab 03/05/19 1810 03/06/19 0039 03/07/19 0422 03/08/19 0318 03/09/19 0840 03/09/19 2114 03/10/19 0641 03/10/19 2037 03/11/19 0306 03/11/19 1820  WBC 13.2* 11.6* 15.5* 16.5* 9.4  --  7.5  --  10.4  --   CREATININE 0.76 0.61 0.70 0.57 0.55 0.58  --   --   --   --   LATICACIDVEN 2.4* 1.0  --   --   --   --   --   --   --   --   VANCOTROUGH  --   --   --   --   --   --   --   --   --  7*  VANCOPEAK  --   --   --   --   --   --   --  24*  --   --     Estimated Creatinine Clearance: 68.7 mL/min (by C-G formula based on SCr of 0.58 mg/dL).    Allergies  Allergen Reactions  . Aleve [Naproxen] Hives, Shortness Of Breath and Rash    Pt Avoids All Nsaids  . Protonix [Pantoprazole Sodium] Other (See Comments)    Makes her feel wreidd  . Zocor [Simvastatin] Other (See Comments)    "weird feeling all over body"    Antimicrobials this admission: Vanc 4/23>> Ceftriaxone 4/23 >   Dose adjustments this admission: 4/29: AUC on 1000mg  IV q24h= 340; changed to 1250mg  IV q24h  Microbiology results: 4/28 abscess- GPC/pairs 4/23 BCx x 2: neg 4/23 UCx: multiple species  Hildred Laser, PharmD Clinical Pharmacist **Pharmacist phone directory can now be found on [email or web address] ([email or web address]).  Listed under [place].

## 2019-03-11 NOTE — Progress Notes (Signed)
Occupational Therapy Treatment Patient Details Name: [name] [name] MRN: [id number] DOB: [birth year] Today's Date: 03/11/2019    History of present illness Pt is a 68 y.o. female admitted from [place] 03/05/19 with progressive decline and weakness over past several days. Worked up for sepsis secondary to acute cellulitis of L buttock wound. CXR without acute infection. PMH includes progressive Parksinson's, progressive supranuclear palsy, DM2, HTN, OSA, CHF.    OT comments  Patient progressing well.  Limited by pain in buttocks, impaired balance and overall generalized weakness.  Able to complete functional transfers using RW today, min assist +2 initially then fades to mod assist +2 with fatigue.  Seated at sink with min assist to engage in grooming tasks, increased time and effort to manipulate toothpaste and toothbrush.  Will continue to follow, DC plan remains appropriate.    Follow Up Recommendations  Home health OT;Supervision/Assistance - 24 hour    Equipment Recommendations  None recommended by OT    Recommendations for Other Services      Precautions / Restrictions Precautions Precautions: Fall Precaution Comments: Sacral and perineal wounds Restrictions Weight Bearing Restrictions: No       Mobility Bed Mobility Overal bed mobility: Needs Assistance Bed Mobility: Supine to Sit;Sit to Supine;Sit to Sidelying     Supine to sit: Max assist;+2 for physical assistance;HOB elevated   Sit to sidelying: Max assist;+2 for physical assistance;+2 for safety/equipment General bed mobility comments: transfered to L side of bed with max assist +2 and returned to supine with max assist to 2, pt resisting mobility due to pain in buttocks   Transfers Overall transfer level: Needs assistance Equipment used: Rolling walker (2 wheeled) Transfers: Sit to/from Stand Sit to Stand: Min assist;Mod assist;+2 physical assistance;+2 safety/equipment         General transfer comment:  initial min assist +2 from EOB with increased assist to mod assist +2 when fatigued; requires cueing for hand placement, sequencing and coordination     Balance Overall balance assessment: Needs assistance Sitting-balance support: No upper extremity supported;Feet supported Sitting balance-Leahy Scale: Fair Sitting balance - Comments: min guard for safety, posterior lean at times    Standing balance support: Bilateral upper extremity supported;During functional activity Standing balance-Leahy Scale: Fair Standing balance comment: relaint on BUE support                           ADL either performed or assessed with clinical judgement   ADL Overall ADL's : Needs assistance/impaired     Grooming: Minimal assistance;Sitting;Wash/dry face;Oral care Grooming Details (indicate cue type and reason): min assist to manage items completing oral care, increased time required                 Toilet Transfer: Minimal assistance;Moderate assistance;+2 for safety/equipment;+2 for physical assistance;Ambulation;RW Toilet Transfer Details (indicate cue type and reason): simulated to arm chair, increased assist with fatigue                  Vision   Vision Assessment?: No apparent visual deficits   Perception     Praxis      Cognition Arousal/Alertness: Awake/alert Behavior During Therapy: WFL for tasks assessed/performed Overall Cognitive Status: Within Functional Limits for tasks assessed  Exercises     Shoulder Instructions       General Comments VSS    Pertinent Vitals/ Pain       Pain Assessment: Faces Faces Pain Scale: Hurts even more Pain Location: buttock Pain Descriptors / Indicators: Discomfort;Grimacing Pain Intervention(s): Monitored during session;Repositioned  Home Living                                          Prior Functioning/Environment               Frequency  Min 2X/week        Progress Toward Goals  OT Goals(current goals can now be found in the care plan section)  Progress towards OT goals: Progressing toward goals  Acute Rehab OT Goals Patient Stated Goal: to go home OT Goal Formulation: With patient Time For Goal Achievement: 03/21/19 Potential to Achieve Goals: [place] Discharge plan remains appropriate;Frequency remains appropriate    Co-evaluation    PT/OT/SLP Co-Evaluation/Treatment: Yes Reason for Co-Treatment: To address functional/ADL transfers   OT goals addressed during session: ADL's and self-care      AM-PAC OT "6 Clicks" Daily Activity     Outcome Measure   Help from another person eating meals?: None Help from another person taking care of personal grooming?: A Little Help from another person toileting, which includes using toliet, bedpan, or urinal?: Total Help from another person bathing (including washing, rinsing, drying)?: A Lot Help from another person to put on and taking off regular upper body clothing?: A Little Help from another person to put on and taking off regular lower body clothing?: Total 6 Click Score: 14    End of Session Equipment Utilized During Treatment: Gait belt;Rolling walker  OT Visit Diagnosis: Unsteadiness on feet (R26.81);Muscle weakness (generalized) (M62.81)   Activity Tolerance Patient tolerated treatment well   Patient Left in bed;with call bell/phone within reach   Nurse Communication Mobility status;Other (comment)(pain)        Time: 7209-4709 OT Time Calculation (min): 35 min  Charges: OT General Charges $OT Visit: 1 Visit OT Treatments $Self Care/Home Management : 8-22 mins  Delight Stare, [place] Pager [phone] Office [phone]     Delight Stare 03/11/2019, 4:52 PM

## 2019-03-11 NOTE — Progress Notes (Signed)
PROGRESS NOTE    [name] [name]  EXH:[id number] DOB: 1951-02-22 DOA: 03/05/2019 PCP: [name], MD   Brief Narrative:  Per HPI:  68 y.o.femalewith medical history significant ofdepression, obstructive sleep apnea, diabetes mellitus type 2, essential hypertension, diastolic CHF, hyperlipidemia was brought to the hospital for evaluation of weakness for 2 days and sliding of her chair onto the floor. She is also had some subjective fevers and chills with concerns for infection in her sacrum area of her pressure ulcer. Patient states she has been feeling very weak and has remained pretty much bedbound over the last couple of days. Typically she uses walker to get around.She gets home health who comes in daily. In the ER patient was noted to have relative leukocytosis, febrile and tachycardia. CT of the pelvis showed concerning for cellulitis extending into her left gluteus area with slight drainage. She was started on IV Rocephin and vancomycin. Medical team requested to admit the patient. Patient denies any respiratory symptoms including shortness of breath, cough. Denies any sick contacts including concerns for COVID. Patient was admitted and  being managed for left buttock cellulitis and wound. Patient did not improve well, seen by surgery 4/27 underwent debridement of the Left perineal abscess 4/28    Assessment & Plan:   Principal Problem:   Sepsis [facility]) Active Problems:   Diabetes type 2, uncontrolled ([place])   Lumbar radiculopathy   OSA on CPAP   Cellulitis of buttock, left   Pressure injury of skin   Sepsis 2/2 left buttock cellulitis/perineal abscess POA: Status post debridement 4/28.  Continue on ceftriaxone/vancomycin.  Leukocytosis resolved.  Patient is afebrile.  OR wound culture sent and is pending.   Continue postop care per surgery. Cont bowel regimen, supportive care.  Candida intertrigo continue nystatin.  Diabetes type 2, controlled  Current hba1c  stable 5.7.  Continue SSI.  Weakness/deconditioning continue PT OT.  Recommending home health PT post discharge  History of Parkinson disease on Sinemet, Lexapro  Essential hypertension: stable, ARB is on hold  Lumbar radiculopathy: stable  OSA on CPAP  DVT prophylaxis: Heparin SQ  Code Status: Full    Code Status Orders  (From admission, onward)         Start     Ordered   03/05/19 2147  Full code  Continuous     03/05/19 2153        Code Status History    Date Active Date Inactive Code Status Order ID Comments User Context   03/31/2018 0914 04/08/2018 2142 Partial Code [id number]  Chesley Mires, MD Inpatient   03/29/2018 0320 03/31/2018 0914 Full Code [id number]  Reyne Dumas, MD Inpatient   03/29/2018 0309 03/29/2018 0320 DNR [id number]  Reyne Dumas, MD Inpatient   03/29/2018 0138 03/29/2018 0309 DNR [id number]  Etta Quill, DO Inpatient   03/28/2018 2139 03/29/2018 0138 DNR [id number]  Deno Etienne, DO ED   12/10/2016 1002 02/15/2017 1106 DNR [id number]  Ludwig Clarks, DO Outpatient   07/20/2016 2204 07/23/2016 1836 Full Code [id number]  Netta Cedars, MD Inpatient   06/15/2016 1345 06/30/2016 1127 DNR [id number]  Annamaria Helling, [place] [facility]   03/23/2016 1422 03/25/2016 1852 Full Code [id number]  Leeroy Cha, MD Inpatient     Family Communication: Spoke with son [name] today Disposition Plan:   Remain inpatient for continued IV antibiotics secondary to abscess and postop care.  She required frequent nurse intervention postoperatively with plans for discharge once stable Consults called: None Admission status: Inpatient  Consultants:   General surgery  Procedures:  Ct Pelvis W Contrast  Result Date: 03/05/2019 CLINICAL DATA:  68 year old female with a history of decubitus ulcer EXAM: CT PELVIS WITH CONTRAST TECHNIQUE: Multidetector CT imaging of the pelvis was performed using the standard protocol following the bolus administration of intravenous contrast.  CONTRAST:  18mL OMNIPAQUE IOHEXOL 300 MG/ML  SOLN COMPARISON:  03/28/2018 FINDINGS: Urinary Tract: Visualized aspects of the inferior kidneys demonstrate right-sided nonobstructive nephrolithiasis, and low-density cysts bilaterally. Urinary bladder partially distended. Unremarkable appearance of the ureters. Bowel: Visualized small bowel unremarkable. Normal appendix. Unremarkable colon with no inflammatory changes. Vascular/Lymphatic: Mild atherosclerosis of the abdominal aorta. Bilateral iliac arteries and proximal femoral arteries are patent. Inguinal lymph nodes are present, more numerous on the left, potentially reactive. Small lymph nodes of the left iliac nodal station. Reproductive:  Unremarkable uterus. Other: Inflammation/infiltration of the soft tissues of the proximal left thigh extending to the gluteal fold and into the left inguinal intertriginous fold. No focal fluid. No extension into the ischial rectal fossa or into the pelvis. Trace contralateral skin thickening of the right medial thigh/gluteal region. No abdominal hernia. Musculoskeletal: No displaced fracture. Degenerative changes of the lower lumbar spine including vacuum disc phenomenon spanning L3-S1. IMPRESSION: Inflammation in the posteromedial left thigh extending to the gluteal fold and to the left inguinal intertriginous fold, more compatible with cellulitis than decubitus ulcer. No evidence of abscess. Likely reactive left inguinal adenopathy. Ancillary findings as above. Electronically Signed   By: Corrie Mckusick D.O.   On: 03/05/2019 20:08   Dg Chest Port 1 View  Result Date: 03/05/2019 CLINICAL DATA:  Fever EXAM: PORTABLE CHEST 1 VIEW COMPARISON:  03/30/2018 FINDINGS: Heart and mediastinal contours are within normal limits. No focal opacities or effusions. No acute bony abnormality. IMPRESSION: No active cardiopulmonary disease. Electronically Signed   By: Rolm Baptise M.D.   On: 03/05/2019 19:07   [place] Lt Lower Extrem Ltd Soft  Tissue Non Vascular  Result Date: 03/09/2019 CLINICAL DATA:  Swelling and drainage over the left gluteal region 1 week. Diabetes and obesity with hypertension. EXAM: ULTRASOUND LEFT LOWER EXTREMITY LIMITED TECHNIQUE: Ultrasound examination of the lower extremity soft tissues was performed in the area of clinical concern inferior to the left gluteal region adjacent the midline. COMPARISON:  Pelvic CT 03/05/2019 FINDINGS: Joint Space: Not evaluated. Muscles: Not evaluated. Tendons: Not evaluated. Other Soft Tissue Structures: Ultrasound exam over the area in question inferior to the left gluteal region adjacent the midline demonstrates moderate subcutaneous edema without focal fluid collection to suggest subcutaneous abscess. Findings may be due to soft tissue infection. IMPRESSION: Moderate subcutaneous edema over the area in question adjacent the midline inferior to the left gluteal region. No evidence of subcutaneous abscess. Findings may be due to cellulitis without focal abscess. Electronically Signed   By: Marin Olp M.D.   On: 03/09/2019 12:02     Antimicrobials:   Ceftriaxone and vancomycin day 6   Subjective: Patient reports doing well postoperatively mild pain but well controlled.  Objective: Vitals:   03/10/19 2000 03/11/19 0005 03/11/19 0037 03/11/19 0512  BP:  138/72  136/64  Pulse:  66  62  Resp: 19  (!) 24   Temp:  98 F (36.7 C)  98.1 F (36.7 C)  TempSrc:  Oral  Oral  SpO2:  98%  98%  Weight:    86.1 kg  Height:        Intake/Output Summary (Last 24 hours) at 03/11/2019 1549 Last data  filed at 03/11/2019 0848 Gross per 24 hour  Intake 935 ml  Output 1475 ml  Net -540 ml   Filed Weights   03/09/19 0419 03/10/19 0502 03/11/19 0512  Weight: 88.9 kg 87.6 kg 86.1 kg    Examination:  General exam: Appears calm and comfortable  Respiratory system: Clear to auscultation. Respiratory effort normal. Cardiovascular system: S1 & S2 heard, RRR. No JVD, murmurs, rubs,  gallops or clicks. No pedal edema. Gastrointestinal system: Abdomen is nondistended, soft and nontender. No organomegaly or masses felt. Normal bowel sounds heard. Central nervous system: Alert and oriented. No focal neurological deficits. Extremities: Symmetric 5 x 5 power. Skin: Deferred examination to general surgery postoperatively Psychiatry: Judgement and insight appear normal. Mood & affect appropriate.     Data Reviewed: I have personally reviewed following labs and imaging studies  CBC: Recent Labs  Lab 03/05/19 1810 03/06/19 0039 03/07/19 0422 03/08/19 0318 03/09/19 0840 03/10/19 0641 03/11/19 0306  WBC 13.2* 11.6* 15.5* 16.5* 9.4 7.5 10.4  NEUTROABS 11.3* 9.6*  --   --   --   --   --   HGB 11.5* 10.5* 9.8* 10.0* 11.0* 10.7* 10.0*  HCT 35.7* 33.2* 30.1* 30.4* 33.2* 32.3* 30.5*  MCV 92.5 91.5 91.5 89.9 89.5 90.0 90.5  PLT 238 213 218 260 341 363 026*   Basic Metabolic Panel: Recent Labs  Lab 03/06/19 0039 03/07/19 0422 03/08/19 0318 03/09/19 0840 03/09/19 2114 03/10/19 0333  NA 142 140 139 140 138  --   K 3.0* 3.6 3.5 3.8 3.5  --   CL 111 110 107 105 101  --   CO2 22 19* 24 26 32  --   GLUCOSE 127* 115* 110* 127* 135*  --   BUN 11 18 12 11 10   --   CREATININE 0.61 0.70 0.57 0.55 0.58  --   CALCIUM 8.7* 8.9 9.0 8.9 9.1  --   MG 1.4* 1.7 1.6* 1.8  --  1.8   GFR: Estimated Creatinine Clearance: 68.7 mL/min (by C-G formula based on SCr of 0.58 mg/dL). Liver Function Tests: Recent Labs  Lab 03/05/19 1810 03/06/19 0039 03/08/19 0318 03/09/19 0840 03/09/19 2114  AST 19 13* 14* 18 15  ALT 18 6 6 9  <5  ALKPHOS 75 66 83 72 67  BILITOT 0.6 0.9 0.6 0.7 0.6  PROT 6.7 6.0* 5.8* 6.1* 5.8*  ALBUMIN 3.4* 2.7* 2.2* 2.5* 2.4*   No results for input(s): LIPASE, AMYLASE in the last 168 hours. No results for input(s): AMMONIA in the last 168 hours. Coagulation Profile: No results for input(s): INR, PROTIME in the last 168 hours. Cardiac Enzymes: No results for  input(s): CKTOTAL, CKMB, CKMBINDEX, TROPONINI in the last 168 hours. BNP (last 3 results) No results for input(s): PROBNP in the last 8760 hours. HbA1C: No results for input(s): HGBA1C in the last 72 hours. CBG: Recent Labs  Lab 03/10/19 1226 03/10/19 1656 03/10/19 2113 03/11/19 0825 03/11/19 1154  GLUCAP 139* 208* 161* 101* 121*   Lipid Profile: No results for input(s): CHOL, HDL, LDLCALC, TRIG, CHOLHDL, LDLDIRECT in the last 72 hours. Thyroid Function Tests: No results for input(s): TSH, T4TOTAL, FREET4, T3FREE, THYROIDAB in the last 72 hours. Anemia Panel: No results for input(s): VITAMINB12, FOLATE, FERRITIN, TIBC, IRON, RETICCTPCT in the last 72 hours. Sepsis Labs: Recent Labs  Lab 03/05/19 1810 03/06/19 0039  LATICACIDVEN 2.4* 1.0    Recent Results (from the past 240 hour(s))  Blood Culture (routine x 2)  Status: None   Collection Time: 03/05/19  6:10 PM  Result Value Ref Range Status   Specimen Description BLOOD RIGHT FOREARM  Final   Special Requests   Final    BOTTLES DRAWN AEROBIC AND ANAEROBIC Blood Culture adequate volume   Culture   Final    NO GROWTH 5 DAYS Performed at [facility], [address] [place], [place] [address]    Report Status 03/10/2019 FINAL  Final  Blood Culture (routine x 2)     Status: None   Collection Time: 03/05/19  6:39 PM  Result Value Ref Range Status   Specimen Description BLOOD LEFT HAND  Final   Special Requests   Final    BOTTLES DRAWN AEROBIC AND ANAEROBIC Blood Culture adequate volume   Culture   Final    NO GROWTH 5 DAYS Performed at [facility], [place] [address], [place], [place] [address]    Report Status 03/10/2019 FINAL  Final  Urine culture     Status: Abnormal   Collection Time: 03/05/19  8:30 PM  Result Value Ref Range Status   Specimen Description URINE, CLEAN CATCH  Final   Special Requests   Final    NONE Performed at [facility], [place] [address] [place], [place] [address]     Culture MULTIPLE SPECIES PRESENT, SUGGEST RECOLLECTION (A)  Final   Report Status 03/06/2019 FINAL  Final  Anaerobic culture     Status: None (Preliminary result)   Collection Time: 03/10/19 10:13 AM  Result Value Ref Range Status   Specimen Description ABSCESS PERINEAL  Final   Special Requests PATIENT ON FOLLOWING ROCEPHIN AND VANC  Final   Gram Stain   Final    RARE WBC PRESENT, PREDOMINANTLY PMN RARE GRAM POSITIVE COCCI IN PAIRS Performed at [facility], [place] [address], [place], [place] [address]    Culture PENDING  Incomplete   Report Status PENDING  Incomplete  MRSA PCR Screening     Status: Abnormal   Collection Time: 03/10/19  9:12 PM  Result Value Ref Range Status   MRSA by PCR POSITIVE (A) NEGATIVE Final    Comment:        The GeneXpert MRSA Assay (FDA approved for NASAL specimens only), is one component of a comprehensive MRSA colonization surveillance program. It is not intended to diagnose MRSA infection nor to guide or monitor treatment for MRSA infections. RESULT CALLED TO, READ BACK BY AND VERIFIED WITHLianne Moris RN [id number] 03/10/19 A BROWNING Performed at [facility], [place] [address] [place], [place] [address]          Radiology Studies: No results found.      Scheduled Meds: . aspirin  81 mg Oral Daily  . carbidopa-levodopa  2 tablet Oral TID  . Chlorhexidine Gluconate Cloth  6 each Topical Q0600  . cholecalciferol  5,000 Units Oral Daily  . escitalopram  15 mg Oral Daily  . feeding supplement (GLUCERNA SHAKE)  237 mL Oral TID BM  . fenofibrate  160 mg Oral q morning - 10a  . heparin  5,000 Units Subcutaneous Q8H  . insulin aspart  0-15 Units Subcutaneous TID WC  . insulin aspart  0-5 Units Subcutaneous QHS  . mupirocin cream   Topical BID  . mupirocin ointment  1 application Nasal BID  . nystatin   Topical TID  . pneumococcal 23 valent vaccine  0.5 mL Intramuscular Tomorrow-1000   Continuous Infusions: . cefTRIAXone  (  ROCEPHIN)  IV 1 g (03/10/19 1726)  . lactated ringers 10 mL/hr at 03/10/19 0830  . vancomycin 1,000 mg (03/10/19 1842)     LOS: 6 days    Time spent: 36 min    Nicolette Bang, MD [facility]  If 7PM-7AM, please contact night-coverage  03/11/2019, 3:49 PM

## 2019-03-11 NOTE — Anesthesia Postprocedure Evaluation (Signed)
Anesthesia Post Note  Patient: [name] [name]  Procedure(s) Performed: EXCISION AND DEBRIDEMENT LEFT PERINEAL WOUND (Left Perineum)     Patient location during evaluation: PACU Anesthesia Type: General Level of consciousness: awake and alert Pain management: pain level controlled Vital Signs Assessment: post-procedure vital signs reviewed and stable Respiratory status: spontaneous breathing, nonlabored ventilation, respiratory function stable and patient connected to nasal cannula oxygen Cardiovascular status: blood pressure returned to baseline and stable Postop Assessment: no apparent nausea or vomiting Anesthetic complications: no    Last Vitals:  Vitals:   03/11/19 0037 03/11/19 0512  BP:  136/64  Pulse:  62  Resp: (!) 24   Temp:  36.7 C  SpO2:  98%    Last Pain:  Vitals:   03/11/19 0512  TempSrc: Oral  PainSc:                  Razia Screws COKER

## 2019-03-11 NOTE — Progress Notes (Signed)
Physical Therapy Treatment Patient Details Name: [name] [name] MRN: [id number] DOB: 20-Apr-1951 Today's Date: 03/11/2019    History of Present Illness Pt is a 68 y.o. female admitted from [place] 03/05/19 with progressive decline and weakness over past several days. Worked up for sepsis secondary to acute cellulitis of L buttock wound. CXR without acute infection. PMH includes progressive Parksinson's, progressive supranuclear palsy, DM2, HTN, OSA, CHF.     PT Comments    Pt is making good progress towards her goals today. Reporting she has not walked that far since December. Pt is limited in safe mobility by decreased strength, balance and endurance. Pt requires max A for bed mobility due to surgical pain, min-modA for transfers and modA for ambulation of 15 feet with RW. D/c plans remain appropriate at this time. PT will continue to follow acutely.    Follow Up Recommendations  Home health PT;Supervision for mobility/OOB     Equipment Recommendations  None recommended by PT       Precautions / Restrictions Precautions Precautions: Fall Precaution Comments: Sacral and perineal wounds Restrictions Weight Bearing Restrictions: No    Mobility  Bed Mobility Overal bed mobility: Needs Assistance Bed Mobility: Supine to Sit;Sit to Supine;Sit to Sidelying     Supine to sit: Max assist;+2 for physical assistance;HOB elevated   Sit to sidelying: Max assist;+2 for physical assistance;+2 for safety/equipment General bed mobility comments: transfered to L side of bed with max assist +2 and returned to supine with max assist to 2, pt resisting mobility due to pain in buttocks   Transfers Overall transfer level: Needs assistance Equipment used: Rolling walker (2 wheeled) Transfers: Sit to/from Stand Sit to Stand: Min assist;Mod assist;+2 physical assistance;+2 safety/equipment         General transfer comment: initial min assist +2 from EOB with increased assist to mod assist +2 when  fatigued; requires cueing for hand placement, sequencing and coordination   Ambulation/Gait Ambulation/Gait assistance: Mod assist Gait Distance (Feet): 15 Feet(1x15, 1x8) Assistive device: Rolling walker (2 wheeled) Gait Pattern/deviations: Step-through pattern;Decreased step length - right;Decreased step length - left;Shuffle;Trunk flexed;Ataxic Gait velocity: slowed Gait velocity interpretation: <1.31 ft/sec, indicative of household ambulator General Gait Details: modA for steadying with RW, ataxic gait, taking 2steps at a time with decreased initiation due to pain, pt ambulated to door and back to sink where she sat and brushed her teeth, pt with increased fatigue with ambulation back to bed, increased time and effort required          Balance Overall balance assessment: Needs assistance Sitting-balance support: No upper extremity supported;Feet supported Sitting balance-Leahy Scale: Fair Sitting balance - Comments: min guard for safety, posterior lean at times    Standing balance support: Bilateral upper extremity supported;During functional activity Standing balance-Leahy Scale: Fair Standing balance comment: relaint on BUE support                            Cognition Arousal/Alertness: Awake/alert Behavior During Therapy: WFL for tasks assessed/performed Overall Cognitive Status: Within Functional Limits for tasks assessed                                           General Comments General comments (skin integrity, edema, etc.): VSS      Pertinent Vitals/Pain Pain Assessment: Faces Faces Pain Scale: Hurts even more Pain Location: buttock  Pain Descriptors / Indicators: Discomfort;Grimacing Pain Intervention(s): Limited activity within patient's tolerance;Monitored during session;Repositioned           PT Goals (current goals can now be found in the care plan section) Acute Rehab PT Goals Patient Stated Goal: to go home PT Goal  Formulation: With patient Time For Goal Achievement: 03/21/19 Potential to Achieve Goals: Good Progress towards PT goals: Progressing toward goals    Frequency    Min 3X/week      PT Plan Current plan remains appropriate    Co-evaluation PT/OT/SLP Co-Evaluation/Treatment: Yes Reason for Co-Treatment: To address functional/ADL transfers;For patient/therapist safety PT goals addressed during session: Mobility/safety with mobility OT goals addressed during session: ADL's and self-care      AM-PAC PT "6 Clicks" Mobility   Outcome Measure  Help needed turning from your back to your side while in a flat bed without using bedrails?: A Lot Help needed moving from lying on your back to sitting on the side of a flat bed without using bedrails?: Total Help needed moving to and from a bed to a chair (including a wheelchair)?: A Lot Help needed standing up from a chair using your arms (e.g., wheelchair or bedside chair)?: A Lot Help needed to walk in hospital room?: A Lot Help needed climbing 3-5 steps with a railing? : Total 6 Click Score: 10    End of Session Equipment Utilized During Treatment: Gait belt Activity Tolerance: Patient tolerated treatment well;Patient limited by fatigue Patient left: in bed;with call bell/phone within reach;with bed alarm set Nurse Communication: Mobility status;Need for lift equipment PT Visit Diagnosis: Other abnormalities of gait and mobility (R26.89)     Time: 3338-3291 PT Time Calculation (min) (ACUTE ONLY): 35 min  Charges:  $Gait Training: 8-22 mins                     Julia B. Migdalia Dk PT, DPT Acute Rehabilitation Services Pager [phone] Office [phone]    [place] 03/11/2019, 5:16 PM

## 2019-03-11 NOTE — Anesthesia Preprocedure Evaluation (Signed)
Anesthesia Evaluation  Patient identified by MRN, date of birth, ID band Patient awake    Reviewed: Allergy & Precautions, NPO status , Patient's Chart, lab work & pertinent test results  Airway Mallampati: II  TM Distance: >3 FB     Dental  (+) Teeth Intact   Pulmonary    breath sounds clear to auscultation       Cardiovascular hypertension,  Rhythm:Regular Rate:Normal     Neuro/Psych    GI/Hepatic   Endo/Other  diabetes  Renal/GU      Musculoskeletal   Abdominal   Peds  Hematology   Anesthesia Other Findings   Reproductive/Obstetrics                             Anesthesia Physical Anesthesia Plan  ASA: III  Anesthesia Plan: General   Post-op Pain Management:    Induction: Intravenous  PONV Risk Score and Plan: Ondansetron and Dexamethasone  Airway Management Planned: LMA  Additional Equipment:   Intra-op Plan:   Post-operative Plan:   Informed Consent: I have reviewed the patients History and Physical, chart, labs and discussed the procedure including the risks, benefits and alternatives for the proposed anesthesia with the patient or authorized representative who has indicated his/her understanding and acceptance.       Plan Discussed with: Anesthesiologist and CRNA  Anesthesia Plan Comments:         Anesthesia Quick Evaluation

## 2019-03-11 NOTE — Progress Notes (Addendum)
[facility]/Trauma Progress Note  1 Day Post-Op   Assessment/Plan DM Generalized weakness H/o Parkinson's disease HTN OSA  L Perineal cellulitis and infection - S/P excision and debridement L Perineal wound, 04/28, Dr. Marlou Starks - wet to dry dressing changes q shift  FEN - carb mod VTE - heparin ID - Rocephin/Vanc Follow up: [facility] for wound check in 2-3 weeks POC- [name] [name] Celene Kras. [phone]  Spoke with [name] [name] about wound care. Pt lives at independent living with 24hr sitter care. [name] [name] is working on making sure pt will have someone who can do wound care but is requesting HH.    LOS: 6 days    Subjective: CC: L perineal wound pain  Pt states pain isn't too bad. Nurse at bedside. States no issues with wound overnight.   Objective: Vital signs in last 24 hours: Temp:  [97.3 F (36.3 C)-98.1 F (36.7 C)] 98.1 F (36.7 C) (04/29 0512) Pulse Rate:  [62-87] 62 (04/29 0512) Resp:  [16-24] 24 (04/29 0037) BP: (135-151)/(64-79) 136/64 (04/29 0512) SpO2:  [94 %-100 %] 98 % (04/29 0512) Weight:  [86.1 kg] 86.1 kg (04/29 0512) Last BM Date: 03/06/19  Intake/Output from previous day: 04/28 0701 - 04/29 0700 In: 1943 [P.O.:360; I.V.:983; IV Piggyback:600] Out: 645 [Urine:625; Blood:20] Intake/Output this shift: Total I/O In: -  Out: 850 [Urine:850]  PE: Gen:  Alert, NAD, pleasant, cooperative Pulm:  Rate and effort normal GU: L perineal wound is clean without purulent drainage, packing removed and repacked. No bleeding noted. Pt tolerated well Skin: warm and dry   Anti-infectives: Anti-infectives (From admission, onward)   Start     Dose/Rate Route Frequency Ordered Stop   03/06/19 1900  vancomycin (VANCOCIN) IVPB 1000 mg/200 mL premix     1,000 mg 200 mL/hr over 60 Minutes Intravenous Every 24 hours 03/05/19 1918     03/06/19 1800  cefTRIAXone (ROCEPHIN) 1 g in sodium chloride 0.9 % 100 mL IVPB     1 g 200 mL/hr over 30 Minutes Intravenous Every 24  hours 03/06/19 1042     03/05/19 2145  vancomycin (VANCOCIN) IVPB 1000 mg/200 mL premix  Status:  Discontinued     1,000 mg 200 mL/hr over 60 Minutes Intravenous  Once 03/05/19 2144 03/05/19 2146   03/05/19 1815  vancomycin (VANCOCIN) IVPB 1000 mg/200 mL premix  Status:  Discontinued     1,000 mg 200 mL/hr over 60 Minutes Intravenous  Once 03/05/19 1802 03/05/19 1805   03/05/19 1815  cefTRIAXone (ROCEPHIN) 2 g in sodium chloride 0.9 % 100 mL IVPB     2 g 200 mL/hr over 30 Minutes Intravenous  Once 03/05/19 1802 03/05/19 1858   03/05/19 1815  vancomycin (VANCOCIN) 1,500 mg in sodium chloride 0.9 % 500 mL IVPB     1,500 mg 250 mL/hr over 120 Minutes Intravenous  Once 03/05/19 1805 03/05/19 2113      Lab Results:  Recent Labs    03/10/19 0641 03/11/19 0306  WBC 7.5 10.4  HGB 10.7* 10.0*  HCT 32.3* 30.5*  PLT 363 430*   BMET Recent Labs    03/09/19 0840 03/09/19 2114  NA 140 138  K 3.8 3.5  CL 105 101  CO2 26 32  GLUCOSE 127* 135*  BUN 11 10  CREATININE 0.55 0.58  CALCIUM 8.9 9.1   PT/INR No results for input(s): LABPROT, INR in the last 72 hours. CMP     Component Value Date/Time   NA 138 03/09/2019 2114  K 3.5 03/09/2019 2114   CL 101 03/09/2019 2114   CO2 32 03/09/2019 2114   GLUCOSE 135 (H) 03/09/2019 2114   BUN 10 03/09/2019 2114   CREATININE 0.58 03/09/2019 2114   CALCIUM 9.1 03/09/2019 2114   PROT 5.8 (L) 03/09/2019 2114   ALBUMIN 2.4 (L) 03/09/2019 2114   AST 15 03/09/2019 2114   ALT <5 03/09/2019 2114   ALKPHOS 67 03/09/2019 2114   BILITOT 0.6 03/09/2019 2114   GFRNONAA >60 03/09/2019 2114   GFRAA >60 03/09/2019 2114   Lipase  No results found for: LIPASE  Studies/Results: [place] Lt Lower Extrem Ltd Soft Tissue Non Vascular  Result Date: 03/09/2019 CLINICAL DATA:  Swelling and drainage over the left gluteal region 1 week. Diabetes and obesity with hypertension. EXAM: ULTRASOUND LEFT LOWER EXTREMITY LIMITED TECHNIQUE: Ultrasound examination of the  lower extremity soft tissues was performed in the area of clinical concern inferior to the left gluteal region adjacent the midline. COMPARISON:  Pelvic CT 03/05/2019 FINDINGS: Joint Space: Not evaluated. Muscles: Not evaluated. Tendons: Not evaluated. Other Soft Tissue Structures: Ultrasound exam over the area in question inferior to the left gluteal region adjacent the midline demonstrates moderate subcutaneous edema without focal fluid collection to suggest subcutaneous abscess. Findings may be due to soft tissue infection. IMPRESSION: Moderate subcutaneous edema over the area in question adjacent the midline inferior to the left gluteal region. No evidence of subcutaneous abscess. Findings may be due to cellulitis without focal abscess. Electronically Signed   By: Marin Olp M.D.   On: 03/09/2019 12:02      Kalman Drape , [facility] Surgery 03/11/2019, 10:40 AM  Pager: [phone] Mon-Wed, Friday 7:00am-4:30pm Thurs 7am-11:30am  Consults: [phone]

## 2019-03-11 NOTE — Progress Notes (Signed)
  Speech Language Pathology Treatment: Dysphagia  Patient Details Name: [name] [name] MRN: [id number] DOB: 1951-03-06 Today's Date: 03/11/2019 Time: 9833-8250 SLP Time Calculation (min) (ACUTE ONLY): 30 min  Assessment / Plan / Recommendation Clinical Impression  Patient seen to address dysphagia goals with upgraded solids (SLP recommendation on initial BSE was Dys 3 solids, thin liquids). Patient did not exhibit any overt s/s of aspiration or penetration with straw sips of thin liquids or with bites of solids (graham cracker), but did exhibit delayed mastication and delayed oral transit with regular solids. Patient stated that although she is on a regular solids diet at ALF, she has assistance with cutting food into small pieces and she avoids foods that would be difficult to chew. As patient is able to make decisions for herself, SLP feels comfortable with her being on a regular solids diet, and with her ordering foods that she can tolerate. (with assistance from staff for cutting up/meal prep and set up). Planned EG esophagus tomorrow (4/30).  Patient may benefit from one more SLP session for diet toleration assessment and patient education for swallow safety. Patient will also likely require further SLP services down the road as her progressive supranuclear palsy progresses, which could be monitored at next venue of care.   HPI HPI: Pt is a 68 yo female admitted with sepsis secondary to acute cellulitis of the L buttock area. CXR on admission without acute infection. Pt has had two previous MBS with most recent this year, with no aspiration observed on either study. She followed with OP SLP for utilization of swallow precautions and dysarthria. PMH includes: DMII, PSP, HTN, OSA, GERD, CHF      Continue with current therapy plan.   SLP Plan          Recommendations  Diet recommendations: Regular;Thin liquid(regular with assistance with cutting food up) Liquids provided via:  Straw Medication Administration: Whole meds with liquid Supervision: Staff to assist with self feeding;Full supervision/cueing for compensatory strategies Compensations: Slow rate;Small sips/bites Postural Changes and/or Swallow Maneuvers: Seated upright 90 degrees                Oral Care Recommendations: Oral care BID Follow up Recommendations: None SLP Visit Diagnosis: Dysphagia, oropharyngeal phase (R13.12)       GO                [name] [name] 03/11/2019, 4:26 PM   [name] Baller, MA, CCC-SLP Speech Therapy [facility] Acute Rehab Pager: [phone]

## 2019-03-12 LAB — BASIC METABOLIC PANEL
Anion gap: 10 (ref 5–15)
BUN: 15 mg/dL (ref 8–23)
CO2: 28 mmol/L (ref 22–32)
Calcium: 9.4 mg/dL (ref 8.9–10.3)
Chloride: 102 mmol/L (ref 98–111)
Creatinine, Ser: 0.62 mg/dL (ref 0.44–1.00)
GFR calc Af Amer: >60 mL/min (ref >60)
GFR calc non Af Amer: >60 mL/min (ref >60)
Glucose, Bld: 116 mg/dL — ABNORMAL HIGH (ref 70–99)
Potassium: 3.8 mmol/L (ref 3.5–5.1)
Sodium: 140 mmol/L (ref 135–145)

## 2019-03-12 LAB — GLUCOSE, CAPILLARY: Glucose-Capillary: 114 mg/dL — ABNORMAL HIGH (ref 70–99)

## 2019-03-12 MED ORDER — CLINDAMYCIN HCL 150 MG PO CAPS: 450.0000 mg | ORAL_CAPSULE | Freq: Three times a day (TID) | ORAL | 0 refills | Status: AC

## 2019-03-12 MED ORDER — METRONIDAZOLE 500 MG PO TABS: 500.0000 mg | ORAL_TABLET | Freq: Three times a day (TID) | ORAL | 0 refills | Status: AC

## 2019-03-12 MED ORDER — MUPIROCIN CALCIUM 2 % EX CREA: TOPICAL_CREAM | Freq: Two times a day (BID) | CUTANEOUS | 0 refills | Status: DC

## 2019-03-12 NOTE — TOC Transition Note (Signed)
Transition of Care [facility]) - CM/SW Discharge Note   Patient Details  Name: [name] [name] MRN: [id number] Date of Birth: [birth year]  [facility] CM/SW Contact:  Bethena Roys, RN Phone Number: 03/12/2019, 1:10 PM   Clinical Narrative: Pt will transition home with the support of caregiver. Caregiver will pick the patient up around 1:30. Tommi Rumps with [facility] aware that patient will transition home today. No further needs from CM at this time.      Final next level of care: Home w Home Health Services Barriers to Discharge: Continued Medical Work up   Patient Goals and CMS Choice   CMS Medicare.gov Compare Post Acute Care list provided to:: Patient Choice offered to / list presented to : Patient   Discharge Plan and Services In-house Referral: NA Discharge Planning Services: CM Consult Post Acute Care Choice: Home Health        HH Arranged: PT [place]: [place] Date [facility] Agency Contacted: 03/10/19 Time [place]: 1517 Representative spoke with at [place]: [place]) Interventions     Readmission Risk Interventions No flowsheet data found.

## 2019-03-12 NOTE — Progress Notes (Signed)
[facility] Progress Note  2 Days Post-Op  Subjective: CC: L perineal wound pain  Pt states pain isn't too bad. Nurse at bedside. States no issues with wound overnight.   Objective: Vital signs in last 24 hours: Temp:  [97.6 F (36.4 C)-98.2 F (36.8 C)] 98.2 F (36.8 C) (04/30 0527) Pulse Rate:  [70-85] 79 (04/30 0527) Resp:  [25] 25 (04/29 2000) BP: (135-139)/(72-75) 139/75 (04/30 0527) SpO2:  [90 %-97 %] 97 % (04/30 0527) Weight:  [86.1 kg] 86.1 kg (04/30 0527) Last BM Date: 03/06/19  Intake/Output from previous day: 04/29 0701 - 04/30 0700 In: 885.2 [P.O.:480; I.V.:200; IV Piggyback:205.2] Out: 2250 [Urine:2250] Intake/Output this shift: No intake/output data recorded.  PE: Gen:  Alert, NAD, pleasant, cooperative Pulm:  Rate and effort normal GU: L perineal wound is clean without purulent drainage, packing removed. No bleeding noted. Pt tolerated well Skin: warm and dry  Lab Results:  Recent Labs    03/10/19 0641 03/11/19 0306  WBC 7.5 10.4  HGB 10.7* 10.0*  HCT 32.3* 30.5*  PLT 363 430*   BMET Recent Labs    03/09/19 2114 03/12/19 0330  NA 138 140  K 3.5 3.8  CL 101 102  CO2 32 28  GLUCOSE 135* 116*  BUN 10 15  CREATININE 0.58 0.62  CALCIUM 9.1 9.4   PT/INR No results for input(s): LABPROT, INR in the last 72 hours. CMP     Component Value Date/Time   NA 140 03/12/2019 0330   K 3.8 03/12/2019 0330   CL 102 03/12/2019 0330   CO2 28 03/12/2019 0330   GLUCOSE 116 (H) 03/12/2019 0330   BUN 15 03/12/2019 0330   CREATININE 0.62 03/12/2019 0330   CALCIUM 9.4 03/12/2019 0330   PROT 5.8 (L) 03/09/2019 2114   ALBUMIN 2.4 (L) 03/09/2019 2114   AST 15 03/09/2019 2114   ALT <5 03/09/2019 2114   ALKPHOS 67 03/09/2019 2114   BILITOT 0.6 03/09/2019 2114   GFRNONAA >60 03/12/2019 0330   GFRAA >60 03/12/2019 0330   Lipase  No results found for: LIPASE     Studies/Results: No results found.  Anti-infectives: Anti-infectives  (From admission, onward)   Start     Dose/Rate Route Frequency Ordered Stop   03/12/19 1900  vancomycin (VANCOCIN) 1,250 mg in sodium chloride 0.9 % 250 mL IVPB     1,250 mg 166.7 mL/hr over 90 Minutes Intravenous Every 24 hours 03/11/19 2008     03/06/19 1900  vancomycin (VANCOCIN) IVPB 1000 mg/200 mL premix     1,000 mg 200 mL/hr over 60 Minutes Intravenous Every 24 hours 03/05/19 1918 03/11/19 1942   03/06/19 1800  cefTRIAXone (ROCEPHIN) 1 g in sodium chloride 0.9 % 100 mL IVPB     1 g 200 mL/hr over 30 Minutes Intravenous Every 24 hours 03/06/19 1042     03/05/19 2145  vancomycin (VANCOCIN) IVPB 1000 mg/200 mL premix  Status:  Discontinued     1,000 mg 200 mL/hr over 60 Minutes Intravenous  Once 03/05/19 2144 03/05/19 2146   03/05/19 1815  vancomycin (VANCOCIN) IVPB 1000 mg/200 mL premix  Status:  Discontinued     1,000 mg 200 mL/hr over 60 Minutes Intravenous  Once 03/05/19 1802 03/05/19 1805   03/05/19 1815  cefTRIAXone (ROCEPHIN) 2 g in sodium chloride 0.9 % 100 mL IVPB     2 g 200 mL/hr over 30 Minutes Intravenous  Once 03/05/19 1802 03/05/19 1858   03/05/19 1815  vancomycin (VANCOCIN) 1,500  mg in sodium chloride 0.9 % 500 mL IVPB     1,500 mg 250 mL/hr over 120 Minutes Intravenous  Once 03/05/19 1805 03/05/19 2113       Assessment/Plan DM Generalized weakness H/o Parkinson's disease HTN OSA  L Perineal cellulitis and infection - S/P excision and debridement L Perineal wound, 04/28, Dr. Marlou Starks - wet to dry dressing changes q shift or prn if soiled - cxs reincubated - recommend 7-10 days PO abx with coverage for MRSA - ok to d/c when medically stable from a surgical standpoint  FEN -carb mod VTE -heparin ID -Rocephin/Vanc Follow up: wound care center POC- son Celene Kras. [phone]  Spoke with son about wound care. Pt lives at independent living with 24hr sitter care. Son is working on making sure pt will have someone who can do wound care but is  requesting HH.   LOS: 7 days    Brigid Re , [facility] Surgery 03/12/2019, 10:24 AM Pager: [phone] Consults: [phone]

## 2019-03-12 NOTE — Progress Notes (Signed)
Physical Therapy Treatment Patient Details Name: [name] [name] MRN: [id number] DOB: Sep 05, 1951 Today's Date: 03/12/2019    History of Present Illness Pt is a 68 y.o. female admitted from [place] 03/05/19 with progressive decline and weakness over past several days. Worked up for sepsis secondary to acute cellulitis of L buttock wound. CXR without acute infection. PMH includes progressive Parksinson's, progressive supranuclear palsy, DM2, HTN, OSA, CHF.     PT Comments    Pt with increased pain today as nurse had just changed her dressing. Despite pain she was still willing to work with therapy. Pt requires max Ax2 for bed mobility to protect wound from shearing with coming to EoB, mod Ax2 for transfers and modA for ambulation of 12 feet with RW. Pt with decreased initiation and difficulty sequencing today as well as not as conversant, probably due to increased pain. Pt expects to d/c home this afternoon with HHPT.    Follow Up Recommendations  Home health PT;Supervision for mobility/OOB     Equipment Recommendations  None recommended by PT       Precautions / Restrictions Precautions Precautions: Fall Precaution Comments: Sacral and perineal wounds Restrictions Weight Bearing Restrictions: No    Mobility  Bed Mobility Overal bed mobility: Needs Assistance Bed Mobility: Supine to Sit;Sit to Supine;Sit to Sidelying     Supine to sit: Max assist;+2 for physical assistance;HOB elevated   Sit to sidelying: Max assist;+2 for physical assistance;+2 for safety/equipment General bed mobility comments: transfered to L side of bed with max assist +2, pt resisting mobility due to pain in buttocks   Transfers Overall transfer level: Needs assistance Equipment used: Rolling walker (2 wheeled) Transfers: Sit to/from Stand Sit to Stand: Mod assist;+2 physical assistance;+2 safety/equipment         General transfer comment: modAx2 for power up to RW, increased vc for hand placement and  increased time needed to steady  Ambulation/Gait Ambulation/Gait assistance: Mod assist Gait Distance (Feet): 12 Feet Assistive device: Rolling walker (2 wheeled) Gait Pattern/deviations: Step-through pattern;Decreased step length - right;Decreased step length - left;Shuffle;Trunk flexed;Ataxic Gait velocity: slowed Gait velocity interpretation: <1.31 ft/sec, indicative of household ambulator General Gait Details: modA for steadying with RW, ataxic gait, continues to have decreased initiation possibly due to surgical pain, increased effort as gait progressed       Balance Overall balance assessment: Needs assistance Sitting-balance support: No upper extremity supported;Feet supported Sitting balance-Leahy Scale: Fair Sitting balance - Comments: min guard for safety, posterior lean at times    Standing balance support: Bilateral upper extremity supported;During functional activity Standing balance-Leahy Scale: Poor Standing balance comment: relaint on BUE support                            Cognition Arousal/Alertness: Awake/alert Behavior During Therapy: WFL for tasks assessed/performed Overall Cognitive Status: Within Functional Limits for tasks assessed                                           General Comments General comments (skin integrity, edema, etc.): VSS      Pertinent Vitals/Pain Pain Assessment: Faces Faces Pain Scale: Hurts whole lot Pain Location: buttock Pain Descriptors / Indicators: Discomfort;Grimacing Pain Intervention(s): Limited activity within patient's tolerance;Monitored during session;Repositioned           PT Goals (current goals can now be found in  the care plan section) Acute Rehab PT Goals Patient Stated Goal: to go home PT Goal Formulation: With patient Time For Goal Achievement: 03/21/19 Potential to Achieve Goals: Good Progress towards PT goals: Progressing toward goals    Frequency    Min  3X/week      PT Plan Current plan remains appropriate       AM-PAC PT "6 Clicks" Mobility   Outcome Measure  Help needed turning from your back to your side while in a flat bed without using bedrails?: A Lot Help needed moving from lying on your back to sitting on the side of a flat bed without using bedrails?: Total Help needed moving to and from a bed to a chair (including a wheelchair)?: A Lot Help needed standing up from a chair using your arms (e.g., wheelchair or bedside chair)?: A Lot Help needed to walk in hospital room?: A Lot Help needed climbing 3-5 steps with a railing? : Total 6 Click Score: 10    End of Session Equipment Utilized During Treatment: Gait belt Activity Tolerance: Patient tolerated treatment well;Patient limited by fatigue Patient left: in bed;with call bell/phone within reach;with bed alarm set Nurse Communication: Mobility status;Need for lift equipment PT Visit Diagnosis: Other abnormalities of gait and mobility (R26.89)     Time: 7517-0017 PT Time Calculation (min) (ACUTE ONLY): 20 min  Charges:  $Gait Training: 8-22 mins                     Apurva B. Migdalia Dk PT, DPT Acute Rehabilitation Services Pager [phone] Office [phone]    [place] 03/12/2019, 1:18 PM

## 2019-03-12 NOTE — Progress Notes (Signed)
  Speech Language Pathology Treatment: Dysphagia  Patient Details Name: [name] [name] MRN: [id number] DOB: 1951-04-19 Today's Date: 03/12/2019 Time: 3383-2919 SLP Time Calculation (min) (ACUTE ONLY): 20 min  Assessment / Plan / Recommendation Clinical Impression  SLP provided skilled observation and Min cues for use of a liquid wash as pt consumed her meds whole in puree, one at a time. A delayed throat clear was noted at the end of pill consumption, suspect related to the larger volume consumed and perhaps globus sensation, as pt drank several sips of thin liquids and then said she felt better. Pt verbalized her preference for the mechanical soft food compared to the regular solids she had been advanced to on previous date. SLP reiterated that pt can select whichever foods she would want, but she likes that they come already cut and softer in texture on the Dys 3 diet. Will change diet back per her request. No further acute SLP needs identified at this time - will sign off.   HPI HPI: Pt is a 68 yo female admitted with sepsis secondary to acute cellulitis of the L buttock area. CXR on admission without acute infection. Pt has had two previous MBS with most recent this year, with no aspiration observed on either study. She followed with OP SLP for utilization of swallow precautions and dysarthria. PMH includes: DMII, PSP, HTN, OSA, GERD, CHF      SLP Plan  All goals met       Recommendations  Diet recommendations: Dysphagia 3 (mechanical soft);Thin liquid Liquids provided via: Straw Medication Administration: Whole meds with puree Supervision: Staff to assist with self feeding;Full supervision/cueing for compensatory strategies Compensations: Slow rate;Small sips/bites Postural Changes and/or Swallow Maneuvers: Seated upright 90 degrees;Upright 30-60 min after meal                Oral Care Recommendations: Oral care BID Follow up Recommendations: None SLP Visit Diagnosis:  Dysphagia, oropharyngeal phase (R13.12) Plan: All goals met       GO                Venita Sheffield Basil Blakesley 03/12/2019, 11:08 AM  [name], M.A. [place] Acute Environmental education officer [phone] Office [phone]

## 2019-03-12 NOTE — Discharge Summary (Signed)
Physician Discharge Summary  LAKEA MITTELMAN ZYS:[id number] DOB: 05-May-1951 DOA: 03/05/2019  PCP: [name], MD  Admit date: 03/05/2019 Discharge date: 03/12/2019  Admitted From: Inpatient Disposition: home  Recommendations for Outpatient Follow-up:  1. Follow up with PCP in 1-2 weeks 2. Please obtain BMP/CBC in one week 3. Please follow up on the following pending results:  Home Health:Yes Equipment/Devices:none  Discharge Condition:Stable CODE STATUS:Full code Diet recommendation: Diabetic diet  Brief/Interim Summary: Per HPI: 68 y.o.femalewith medical history significant ofdepression, obstructive sleep apnea, diabetes mellitus type 2, essential hypertension, diastolic CHF, hyperlipidemia was brought to the hospital for evaluation of weakness for 2 days and sliding of her chair onto the floor. She is also had some subjective fevers and chills with concerns for infection in her sacrum area of her pressure ulcer. Patient states she has been feeling very weak and has remained pretty much bedbound over the last couple of days. Typically she uses walker to get around.She gets home health who comes in daily. In the ER patient was noted to have relative leukocytosis, febrile and tachycardia. CT of the pelvis showed concerning for cellulitis extending into her left gluteus area with slight drainage. She was started on IV Rocephin and vancomycin. Medical team requested to admit the patient. Patient denies any respiratory symptoms including shortness of breath, cough. Denies any sick contacts including concerns for COVID. Patientwasadmitted andbeing managed for left buttock cellulitis and wound. Patient did not improve well, seen by surgery 4/27underwent debridement of the Left perineal abscess 4/28  Hospital course: Sepsis2/2left buttock cellulitis/perineal abscessPOA:Status post debridement 4/28. Pt was continued on ceftriaxone/vancomycin while in the hospital  and will complete a course of abx outpt. Leukocytosis has resolved, pt is afebrile and hemodynamically stable. OR wound culture sentand is reincubating, but we will cover for MRSA.  Case was discussed with gen surgery this AM who felt pt was stable for discharge with wound care and follow-up per GS.  Candida intertrigo: pt received nystatin powder inpt and clinically is improved  Diabetes type 2, pt BG are reasonably well controlledCurrent hba1c stable 5.7.While in the hospital we continued SSI, pt to resume home medications on d/c and monitor blood glucosae closely to facilitate good wound healing  Weakness/deconditioningseen by PT/OT, appreciate their recommendations, order placed  History of Parkinson diseaseon Sinemet, Lexapro which will be continued on d/c  Essential hypertension:stable, restart home meds on d/c, po intake emphasized and pt to f/up with pcp for continued monitoring of bp and renal function  Lumbar radiculopathy: stable, c/w prn analgesics, tramadol per home rx    Discharge Diagnoses:  Principal Problem:   Sepsis ([place]) Active Problems:   Diabetes type 2, uncontrolled ([place])   Lumbar radiculopathy   OSA on CPAP   Cellulitis of buttock, left   Pressure injury of skin    Discharge Instructions  Discharge Instructions    Call MD for:   Complete by:  As directed    Any acute change in medical condition   Diet Carb Modified   Complete by:  As directed    Discharge instructions   Complete by:  As directed    Post-op wound care per general surgery   Increase activity slowly   Complete by:  As directed      Allergies as of 03/12/2019      Reactions   Aleve [naproxen] Hives, Shortness Of Breath, Rash   Pt Avoids All Nsaids   Protonix [pantoprazole Sodium] Other (See Comments)   Makes her feel wreidd  Zocor [simvastatin] Other (See Comments)   "weird feeling all over body"      Medication List    TAKE these medications    acetaminophen 325 MG tablet Commonly known as:  TYLENOL Take 650 mg by mouth 2 (two) times daily.   aspirin 81 MG chewable tablet Chew 81 mg by mouth daily.   azelastine 0.1 % nasal spray Commonly known as:  ASTELIN Place 2 sprays into both nostrils 2 (two) times daily.   BIOFREEZE EX Apply topically.   carbidopa-levodopa 25-100 MG tablet Commonly known as:  SINEMET IR 2 tablets TID What changed:    how much to take  how to take this  when to take this  additional instructions   clindamycin 150 MG capsule Commonly known as:  Cleocin Take 3 capsules (450 mg total) by mouth 3 (three) times daily for 10 days.   diclofenac sodium 1 % Gel Commonly known as:  VOLTAREN Apply 4 g topically 4 (four) times daily.   escitalopram 10 MG tablet Commonly known as:  LEXAPRO Take 15 mg by mouth daily.   fenofibrate 160 MG tablet Take 160 mg by mouth every morning.   fluticasone 50 MCG/ACT nasal spray Commonly known as:  FLONASE Place 2 sprays into both nostrils daily.   loratadine 10 MG tablet Commonly known as:  CLARITIN Take 10 mg by mouth. Taking every other day.   Melatonin 5 MG Caps Take 10 mg by mouth at bedtime as needed.   metroNIDAZOLE 500 MG tablet Commonly known as:  Flagyl Take 1 tablet (500 mg total) by mouth 3 (three) times daily for 10 days.   montelukast 10 MG tablet Commonly known as:  SINGULAIR Take 10 mg by mouth every evening.   mupirocin cream 2 % Commonly known as:  BACTROBAN Apply topically 2 (two) times daily.   nystatin cream Commonly known as:  MYCOSTATIN Apply 1 application topically 2 (two) times daily.   olmesartan 40 MG tablet Commonly known as:  BENICAR Take 40 mg by mouth daily.   traMADol 50 MG tablet Commonly known as:  Ultram Take 1 tablet (50 mg total) by mouth every 6 (six) hours as needed for moderate pain or severe pain. Post-operatively What changed:    when to take this  additional instructions   vitamin B-12  500 MCG tablet Commonly known as:  CYANOCOBALAMIN Take 500 mcg by mouth daily.   VITAMIN D PO Take 5,000 Units by mouth daily.      Follow-up Information    [facility], [facility] Follow up.   Specialty:  Home Health Services Why:  Registered Nurse for Wound Care and Physical Therapy  Contact information: [place] [address] [phone]          Allergies  Allergen Reactions  . Aleve [Naproxen] Hives, Shortness Of Breath and Rash    Pt Avoids All Nsaids  . Protonix [Pantoprazole Sodium] Other (See Comments)    Makes her feel wreidd  . Zocor [Simvastatin] Other (See Comments)    "weird feeling all over body"    Consultations:  gen surg   Procedures/Studies: Ct Pelvis W Contrast  Result Date: 03/05/2019 CLINICAL DATA:  68 year old female with a history of decubitus ulcer EXAM: CT PELVIS WITH CONTRAST TECHNIQUE: Multidetector CT imaging of the pelvis was performed using the standard protocol following the bolus administration of intravenous contrast. CONTRAST:  161mL OMNIPAQUE IOHEXOL 300 MG/ML  SOLN COMPARISON:  03/28/2018 FINDINGS: Urinary Tract: Visualized aspects of  the inferior kidneys demonstrate right-sided nonobstructive nephrolithiasis, and low-density cysts bilaterally. Urinary bladder partially distended. Unremarkable appearance of the ureters. Bowel: Visualized small bowel unremarkable. Normal appendix. Unremarkable colon with no inflammatory changes. Vascular/Lymphatic: Mild atherosclerosis of the abdominal aorta. Bilateral iliac arteries and proximal femoral arteries are patent. Inguinal lymph nodes are present, more numerous on the left, potentially reactive. Small lymph nodes of the left iliac nodal station. Reproductive:  Unremarkable uterus. Other: Inflammation/infiltration of the soft tissues of the proximal left thigh extending to the gluteal fold and into the left inguinal intertriginous fold. No focal fluid. No extension into  the ischial rectal fossa or into the pelvis. Trace contralateral skin thickening of the right medial thigh/gluteal region. No abdominal hernia. Musculoskeletal: No displaced fracture. Degenerative changes of the lower lumbar spine including vacuum disc phenomenon spanning L3-S1. IMPRESSION: Inflammation in the posteromedial left thigh extending to the gluteal fold and to the left inguinal intertriginous fold, more compatible with cellulitis than decubitus ulcer. No evidence of abscess. Likely reactive left inguinal adenopathy. Ancillary findings as above. Electronically Signed   By: Corrie Mckusick D.O.   On: 03/05/2019 20:08   Dg Chest Port 1 View  Result Date: 03/05/2019 CLINICAL DATA:  [place] EXAM: PORTABLE CHEST 1 VIEW COMPARISON:  03/30/2018 FINDINGS: Heart and mediastinal contours are within normal limits. No focal opacities or effusions. No acute bony abnormality. IMPRESSION: No active cardiopulmonary disease. Electronically Signed   By: Rolm Baptise M.D.   On: 03/05/2019 19:07   [place] Lt Lower Extrem Ltd Soft Tissue Non Vascular  Result Date: 03/09/2019 CLINICAL DATA:  Swelling and drainage over the left gluteal region 1 week. Diabetes and obesity with hypertension. EXAM: ULTRASOUND LEFT LOWER EXTREMITY LIMITED TECHNIQUE: Ultrasound examination of the lower extremity soft tissues was performed in the area of clinical concern inferior to the left gluteal region adjacent the midline. COMPARISON:  Pelvic CT 03/05/2019 FINDINGS: Joint Space: Not evaluated. Muscles: Not evaluated. Tendons: Not evaluated. Other Soft Tissue Structures: Ultrasound exam over the area in question inferior to the left gluteal region adjacent the midline demonstrates moderate subcutaneous edema without focal fluid collection to suggest subcutaneous abscess. Findings may be due to soft tissue infection. IMPRESSION: Moderate subcutaneous edema over the area in question adjacent the midline inferior to the left gluteal region. No  evidence of subcutaneous abscess. Findings may be due to cellulitis without focal abscess. Electronically Signed   By: Marin Olp M.D.   On: 03/09/2019 12:02       Subjective: Pt doing well, reports she feels ready to go home, gen surg and IM in agreement No acute events ovenright  Discharge Exam: Vitals:   03/11/19 2349 03/12/19 0527  BP: 135/72 139/75  Pulse: 70 79  Resp:    Temp: 97.9 F (36.6 C) 98.2 F (36.8 C)  SpO2: 94% 97%   Vitals:   03/11/19 1948 03/11/19 2000 03/11/19 2349 03/12/19 0527  BP: 136/72  135/72 139/75  Pulse: 85  70 79  Resp:  (!) 25    Temp: 97.6 F (36.4 C)  97.9 F (36.6 C) 98.2 F (36.8 C)  TempSrc: Oral  Oral Oral  SpO2: 90%  94% 97%  Weight:    86.1 kg  Height:        General: Pt is alert, awake, not in acute distress Cardiovascular: RRR, S1/S2 +, no rubs, no gallops Respiratory: CTA bilaterally, no wheezing, no rhonchi Abdominal: Soft, NT, ND, bowel sounds + Extremities: no edema, no cyanosis Skin: examined by  gen surgery this morning, discussed with patient she did not want another examination, wound reported to be clean without purulent drainage   The results of significant diagnostics from this hospitalization (including imaging, microbiology, ancillary and laboratory) are listed below for reference.     Microbiology: Recent Results (from the past 240 hour(s))  Blood Culture (routine x 2)     Status: None   Collection Time: 03/05/19  6:10 PM  Result Value Ref Range Status   Specimen Description BLOOD RIGHT FOREARM  Final   Special Requests   Final    BOTTLES DRAWN AEROBIC AND ANAEROBIC Blood Culture adequate volume   Culture   Final    NO GROWTH 5 DAYS Performed at [facility], [address] [place], [place] [address]    Report Status 03/10/2019 FINAL  Final  Blood Culture (routine x 2)     Status: None   Collection Time: 03/05/19  6:39 PM  Result Value Ref Range Status   Specimen Description BLOOD LEFT HAND   Final   Special Requests   Final    BOTTLES DRAWN AEROBIC AND ANAEROBIC Blood Culture adequate volume   Culture   Final    NO GROWTH 5 DAYS Performed at [facility], [place] [address] [place], [place] [address]    Report Status 03/10/2019 FINAL  Final  Urine culture     Status: Abnormal   Collection Time: 03/05/19  8:30 PM  Result Value Ref Range Status   Specimen Description URINE, CLEAN CATCH  Final   Special Requests   Final    NONE Performed at [facility], [place] [address], [place], [place] [address]    Culture MULTIPLE SPECIES PRESENT, SUGGEST RECOLLECTION (A)  Final   Report Status 03/06/2019 FINAL  Final  Anaerobic culture     Status: None (Preliminary result)   Collection Time: 03/10/19 10:13 AM  Result Value Ref Range Status   Specimen Description ABSCESS PERINEAL  Final   Special Requests PATIENT ON FOLLOWING ROCEPHIN AND VANC  Final   Gram Stain   Final    RARE WBC PRESENT, PREDOMINANTLY PMN RARE GRAM POSITIVE COCCI IN PAIRS Performed at [facility], [place] [address] [place], [place] [address]    Culture   Final    NO ANAEROBES ISOLATED; CULTURE IN PROGRESS FOR 5 DAYS   Report Status PENDING  Incomplete  MRSA PCR Screening     Status: Abnormal   Collection Time: 03/10/19  9:12 PM  Result Value Ref Range Status   MRSA by PCR POSITIVE (A) NEGATIVE Final    Comment:        The GeneXpert MRSA Assay (FDA approved for NASAL specimens only), is one component of a comprehensive MRSA colonization surveillance program. It is not intended to diagnose MRSA infection nor to guide or monitor treatment for MRSA infections. RESULT CALLED TO, READ BACK BY AND VERIFIED WITHLianne Moris RN [id number] 03/10/19 A BROWNING Performed at [facility], [place] [address], [place], [place] [address]      Labs: BNP (last 3 results) No results for input(s): BNP in the last 8760 hours. Basic Metabolic Panel: Recent Labs  Lab 03/06/19 0039 03/07/19 0422  03/08/19 0318 03/09/19 0840 03/09/19 2114 03/10/19 0333 03/12/19 0330  NA 142 140 139 140 138  --  140  K 3.0* 3.6 3.5 3.8 3.5  --  3.8  CL 111 110 107 105 101  --  102  CO2  22 19* 24 26 32  --  28  GLUCOSE 127* 115* 110* 127* 135*  --  116*  BUN 11 18 12 11 10   --  15  CREATININE 0.61 0.70 0.57 0.55 0.58  --  0.62  CALCIUM 8.7* 8.9 9.0 8.9 9.1  --  9.4  MG 1.4* 1.7 1.6* 1.8  --  1.8  --    Liver Function Tests: Recent Labs  Lab 03/05/19 1810 03/06/19 0039 03/08/19 0318 03/09/19 0840 03/09/19 2114  AST 19 13* 14* 18 15  ALT 18 6 6 9  <5  ALKPHOS 75 66 83 72 67  BILITOT 0.6 0.9 0.6 0.7 0.6  PROT 6.7 6.0* 5.8* 6.1* 5.8*  ALBUMIN 3.4* 2.7* 2.2* 2.5* 2.4*   No results for input(s): LIPASE, AMYLASE in the last 168 hours. No results for input(s): AMMONIA in the last 168 hours. CBC: Recent Labs  Lab 03/05/19 1810 03/06/19 0039 03/07/19 0422 03/08/19 0318 03/09/19 0840 03/10/19 0641 03/11/19 0306  WBC 13.2* 11.6* 15.5* 16.5* 9.4 7.5 10.4  NEUTROABS 11.3* 9.6*  --   --   --   --   --   HGB 11.5* 10.5* 9.8* 10.0* 11.0* 10.7* 10.0*  HCT 35.7* 33.2* 30.1* 30.4* 33.2* 32.3* 30.5*  MCV 92.5 91.5 91.5 89.9 89.5 90.0 90.5  PLT 238 213 218 260 341 363 430*   Cardiac Enzymes: No results for input(s): CKTOTAL, CKMB, CKMBINDEX, TROPONINI in the last 168 hours. BNP: Invalid input(s): POCBNP CBG: Recent Labs  Lab 03/11/19 0825 03/11/19 1154 03/11/19 1706 03/11/19 2237 03/12/19 0804  GLUCAP 101* 121* 163* 148* 114*   D-Dimer No results for input(s): DDIMER in the last 72 hours. Hgb A1c No results for input(s): HGBA1C in the last 72 hours. Lipid Profile No results for input(s): CHOL, HDL, LDLCALC, TRIG, CHOLHDL, LDLDIRECT in the last 72 hours. Thyroid function studies No results for input(s): TSH, T4TOTAL, T3FREE, THYROIDAB in the last 72 hours.  Invalid input(s): FREET3 Anemia work up No results for input(s): VITAMINB12, FOLATE, FERRITIN, TIBC, IRON, RETICCTPCT in  the last 72 hours. Urinalysis    Component Value Date/Time   COLORURINE YELLOW 03/05/2019 2020   APPEARANCEUR CLEAR 03/05/2019 2020   LABSPEC 1.015 03/05/2019 2020   PHURINE 6.0 03/05/2019 2020   GLUCOSEU NEGATIVE 03/05/2019 2020   HGBUR LARGE (A) 03/05/2019 2020   [place] NEGATIVE 03/05/2019 2020   [place] 03/05/2019 2020   PROTEINUR 30 (A) 03/05/2019 2020   NITRITE NEGATIVE 03/05/2019 2020   LEUKOCYTESUR MODERATE (A) 03/05/2019 2020   Sepsis Labs Invalid input(s): PROCALCITONIN,  WBC,  LACTICIDVEN Microbiology Recent Results (from the past 240 hour(s))  Blood Culture (routine x 2)     Status: None   Collection Time: 03/05/19  6:10 PM  Result Value Ref Range Status   Specimen Description BLOOD RIGHT FOREARM  Final   Special Requests   Final    BOTTLES DRAWN AEROBIC AND ANAEROBIC Blood Culture adequate volume   Culture   Final    NO GROWTH 5 DAYS Performed at [facility], [address], [place],  [address]    Report Status 03/10/2019 FINAL  Final  Blood Culture (routine x 2)     Status: None   Collection Time: 03/05/19  6:39 PM  Result Value Ref Range Status   Specimen Description BLOOD LEFT HAND  Final   Special Requests   Final    BOTTLES DRAWN AEROBIC AND ANAEROBIC Blood Culture adequate volume   Culture  Final    NO GROWTH 5 DAYS Performed at [facility], [place] [address] [place], [place] [address]    Report Status 03/10/2019 FINAL  Final  Urine culture     Status: Abnormal   Collection Time: 03/05/19  8:30 PM  Result Value Ref Range Status   Specimen Description URINE, CLEAN CATCH  Final   Special Requests   Final    NONE Performed at [facility], [place] [address] [place], [place] [address]    Culture MULTIPLE SPECIES PRESENT, SUGGEST RECOLLECTION (A)  Final   Report Status 03/06/2019 FINAL  Final  Anaerobic culture     Status: None (Preliminary result)   Collection Time: 03/10/19 10:13 AM  Result Value Ref Range  Status   Specimen Description ABSCESS PERINEAL  Final   Special Requests PATIENT ON FOLLOWING ROCEPHIN AND VANC  Final   Gram Stain   Final    RARE WBC PRESENT, PREDOMINANTLY PMN RARE GRAM POSITIVE COCCI IN PAIRS Performed at [facility], [place] [address], [place], [place] [address]    Culture   Final    NO ANAEROBES ISOLATED; CULTURE IN PROGRESS FOR 5 DAYS   Report Status PENDING  Incomplete  MRSA PCR Screening     Status: Abnormal   Collection Time: 03/10/19  9:12 PM  Result Value Ref Range Status   MRSA by PCR POSITIVE (A) NEGATIVE Final    Comment:        The GeneXpert MRSA Assay (FDA approved for NASAL specimens only), is one component of a comprehensive MRSA colonization surveillance program. It is not intended to diagnose MRSA infection nor to guide or monitor treatment for MRSA infections. RESULT CALLED TO, READ BACK BY AND VERIFIED WITHLianne Moris RN [id number] 03/10/19 A BROWNING Performed at [facility], [place] [address], [place], [place] [address]      Time coordinating discharge: 35  minutes  SIGNED:   Nicolette Bang, MD  [facility] 03/12/2019, 11:56 AM Pager   If 7PM-7AM, please contact night-coverage [email or web address] Password [email or web address]

## 2019-03-13 DIAGNOSIS — G20 Parkinson's disease: Secondary | ICD-10-CM | POA: Diagnosis not present

## 2019-03-13 DIAGNOSIS — K5730 Diverticulosis of large intestine without perforation or abscess without bleeding: Secondary | ICD-10-CM | POA: Diagnosis not present

## 2019-03-13 DIAGNOSIS — M19041 Primary osteoarthritis, right hand: Secondary | ICD-10-CM | POA: Diagnosis not present

## 2019-03-13 DIAGNOSIS — M19042 Primary osteoarthritis, left hand: Secondary | ICD-10-CM | POA: Diagnosis not present

## 2019-03-13 DIAGNOSIS — J9601 Acute respiratory failure with hypoxia: Secondary | ICD-10-CM | POA: Diagnosis not present

## 2019-03-13 DIAGNOSIS — G4733 Obstructive sleep apnea (adult) (pediatric): Secondary | ICD-10-CM | POA: Diagnosis not present

## 2019-03-13 DIAGNOSIS — M5416 Radiculopathy, lumbar region: Secondary | ICD-10-CM | POA: Diagnosis not present

## 2019-03-13 DIAGNOSIS — L03317 Cellulitis of buttock: Secondary | ICD-10-CM | POA: Diagnosis not present

## 2019-03-13 DIAGNOSIS — B9562 Methicillin resistant Staphylococcus aureus infection as the cause of diseases classified elsewhere: Secondary | ICD-10-CM | POA: Diagnosis not present

## 2019-03-13 DIAGNOSIS — G231 Progressive supranuclear ophthalmoplegia [Steele-Richardson-Olszewski]: Secondary | ICD-10-CM | POA: Diagnosis not present

## 2019-03-13 DIAGNOSIS — M50222 Other cervical disc displacement at C5-C6 level: Secondary | ICD-10-CM | POA: Diagnosis not present

## 2019-03-13 DIAGNOSIS — N179 Acute kidney failure, unspecified: Secondary | ICD-10-CM | POA: Diagnosis not present

## 2019-03-13 DIAGNOSIS — L02215 Cutaneous abscess of perineum: Secondary | ICD-10-CM | POA: Diagnosis not present

## 2019-03-13 DIAGNOSIS — M4802 Spinal stenosis, cervical region: Secondary | ICD-10-CM | POA: Diagnosis not present

## 2019-03-13 DIAGNOSIS — I5031 Acute diastolic (congestive) heart failure: Secondary | ICD-10-CM | POA: Diagnosis not present

## 2019-03-13 DIAGNOSIS — E1142 Type 2 diabetes mellitus with diabetic polyneuropathy: Secondary | ICD-10-CM | POA: Diagnosis not present

## 2019-03-13 DIAGNOSIS — I110 Hypertensive heart disease with heart failure: Secondary | ICD-10-CM | POA: Diagnosis not present

## 2019-03-13 DIAGNOSIS — F329 Major depressive disorder, single episode, unspecified: Secondary | ICD-10-CM | POA: Diagnosis not present

## 2019-03-13 DIAGNOSIS — A419 Sepsis, unspecified organism: Secondary | ICD-10-CM | POA: Diagnosis not present

## 2019-03-13 DIAGNOSIS — M50221 Other cervical disc displacement at C4-C5 level: Secondary | ICD-10-CM | POA: Diagnosis not present

## 2019-03-13 DIAGNOSIS — H04123 Dry eye syndrome of bilateral lacrimal glands: Secondary | ICD-10-CM | POA: Diagnosis not present

## 2019-03-13 DIAGNOSIS — L89322 Pressure ulcer of left buttock, stage 2: Secondary | ICD-10-CM | POA: Diagnosis not present

## 2019-03-13 DIAGNOSIS — M50223 Other cervical disc displacement at C6-C7 level: Secondary | ICD-10-CM | POA: Diagnosis not present

## 2019-03-13 DIAGNOSIS — H9190 Unspecified hearing loss, unspecified ear: Secondary | ICD-10-CM | POA: Diagnosis not present

## 2019-03-15 LAB — ANAEROBIC CULTURE

## 2019-03-18 DIAGNOSIS — L89320 Pressure ulcer of left buttock, unstageable: Secondary | ICD-10-CM | POA: Diagnosis not present

## 2019-03-20 ENCOUNTER — Ambulatory Visit: Payer: PPO | Admitting: Neurology

## 2019-03-20 DIAGNOSIS — E1140 Type 2 diabetes mellitus with diabetic neuropathy, unspecified: Secondary | ICD-10-CM | POA: Diagnosis not present

## 2019-03-20 DIAGNOSIS — L8990 Pressure ulcer of unspecified site, unspecified stage: Secondary | ICD-10-CM | POA: Diagnosis not present

## 2019-03-20 DIAGNOSIS — G1220 Motor neuron disease, unspecified: Secondary | ICD-10-CM | POA: Diagnosis not present

## 2019-03-23 DIAGNOSIS — L89320 Pressure ulcer of left buttock, unstageable: Secondary | ICD-10-CM | POA: Diagnosis not present

## 2019-03-24 DIAGNOSIS — L8990 Pressure ulcer of unspecified site, unspecified stage: Secondary | ICD-10-CM | POA: Diagnosis not present

## 2019-03-24 DIAGNOSIS — E1140 Type 2 diabetes mellitus with diabetic neuropathy, unspecified: Secondary | ICD-10-CM | POA: Diagnosis not present

## 2019-03-24 DIAGNOSIS — R197 Diarrhea, unspecified: Secondary | ICD-10-CM | POA: Diagnosis not present

## 2019-03-25 ENCOUNTER — Encounter: Payer: Self-pay | Admitting: *Deleted

## 2019-03-26 ENCOUNTER — Telehealth: Payer: Self-pay | Admitting: Family Medicine

## 2019-03-26 DIAGNOSIS — R197 Diarrhea, unspecified: Secondary | ICD-10-CM | POA: Diagnosis not present

## 2019-03-26 DIAGNOSIS — L8990 Pressure ulcer of unspecified site, unspecified stage: Secondary | ICD-10-CM | POA: Diagnosis not present

## 2019-03-26 DIAGNOSIS — Z719 Counseling, unspecified: Secondary | ICD-10-CM | POA: Diagnosis not present

## 2019-03-26 NOTE — Telephone Encounter (Signed)
Tried to call pt to sched 3 mo f/u w Debbora Presto, NP no answer and voice mail was full unable to lm for pt

## 2019-04-01 DIAGNOSIS — L89322 Pressure ulcer of left buttock, stage 2: Secondary | ICD-10-CM | POA: Diagnosis not present

## 2019-04-01 DIAGNOSIS — I110 Hypertensive heart disease with heart failure: Secondary | ICD-10-CM | POA: Diagnosis not present

## 2019-04-01 DIAGNOSIS — M50221 Other cervical disc displacement at C4-C5 level: Secondary | ICD-10-CM | POA: Diagnosis not present

## 2019-04-01 DIAGNOSIS — J9601 Acute respiratory failure with hypoxia: Secondary | ICD-10-CM | POA: Diagnosis not present

## 2019-04-01 DIAGNOSIS — M19042 Primary osteoarthritis, left hand: Secondary | ICD-10-CM | POA: Diagnosis not present

## 2019-04-01 DIAGNOSIS — A419 Sepsis, unspecified organism: Secondary | ICD-10-CM | POA: Diagnosis not present

## 2019-04-01 DIAGNOSIS — K5730 Diverticulosis of large intestine without perforation or abscess without bleeding: Secondary | ICD-10-CM | POA: Diagnosis not present

## 2019-04-01 DIAGNOSIS — I5031 Acute diastolic (congestive) heart failure: Secondary | ICD-10-CM | POA: Diagnosis not present

## 2019-04-01 DIAGNOSIS — M50222 Other cervical disc displacement at C5-C6 level: Secondary | ICD-10-CM | POA: Diagnosis not present

## 2019-04-01 DIAGNOSIS — L02215 Cutaneous abscess of perineum: Secondary | ICD-10-CM | POA: Diagnosis not present

## 2019-04-01 DIAGNOSIS — N179 Acute kidney failure, unspecified: Secondary | ICD-10-CM | POA: Diagnosis not present

## 2019-04-01 DIAGNOSIS — L03317 Cellulitis of buttock: Secondary | ICD-10-CM | POA: Diagnosis not present

## 2019-04-01 DIAGNOSIS — H9190 Unspecified hearing loss, unspecified ear: Secondary | ICD-10-CM | POA: Diagnosis not present

## 2019-04-01 DIAGNOSIS — M4802 Spinal stenosis, cervical region: Secondary | ICD-10-CM | POA: Diagnosis not present

## 2019-04-01 DIAGNOSIS — G231 Progressive supranuclear ophthalmoplegia [Steele-Richardson-Olszewski]: Secondary | ICD-10-CM | POA: Diagnosis not present

## 2019-04-01 DIAGNOSIS — G4733 Obstructive sleep apnea (adult) (pediatric): Secondary | ICD-10-CM | POA: Diagnosis not present

## 2019-04-01 DIAGNOSIS — B9562 Methicillin resistant Staphylococcus aureus infection as the cause of diseases classified elsewhere: Secondary | ICD-10-CM | POA: Diagnosis not present

## 2019-04-01 DIAGNOSIS — M5416 Radiculopathy, lumbar region: Secondary | ICD-10-CM | POA: Diagnosis not present

## 2019-04-01 DIAGNOSIS — F329 Major depressive disorder, single episode, unspecified: Secondary | ICD-10-CM | POA: Diagnosis not present

## 2019-04-01 DIAGNOSIS — G20 Parkinson's disease: Secondary | ICD-10-CM | POA: Diagnosis not present

## 2019-04-01 DIAGNOSIS — M50223 Other cervical disc displacement at C6-C7 level: Secondary | ICD-10-CM | POA: Diagnosis not present

## 2019-04-01 DIAGNOSIS — E1142 Type 2 diabetes mellitus with diabetic polyneuropathy: Secondary | ICD-10-CM | POA: Diagnosis not present

## 2019-04-01 DIAGNOSIS — H04123 Dry eye syndrome of bilateral lacrimal glands: Secondary | ICD-10-CM | POA: Diagnosis not present

## 2019-04-01 DIAGNOSIS — M19041 Primary osteoarthritis, right hand: Secondary | ICD-10-CM | POA: Diagnosis not present

## 2019-04-02 ENCOUNTER — Other Ambulatory Visit: Payer: Self-pay | Admitting: Neurology

## 2019-04-02 NOTE — Telephone Encounter (Signed)
Requested Prescriptions   Pending Prescriptions Disp Refills  . carbidopa-levodopa (SINEMET IR) 25-100 MG tablet [Pharmacy Med Name: CARBIDOPA-LEVODOPA 25-100 TAB] 540 tablet 1    Sig: TAKE 2 TABLETS BY MOUTH 3 TIMES A DAY   Rx last filled:07/08/18 #540 1 refill  Pt last seen:10/29/18  Follow up appt scheduled:08/10/19   approved

## 2019-04-15 DIAGNOSIS — G231 Progressive supranuclear ophthalmoplegia [Steele-Richardson-Olszewski]: Secondary | ICD-10-CM | POA: Diagnosis not present

## 2019-04-15 DIAGNOSIS — N179 Acute kidney failure, unspecified: Secondary | ICD-10-CM | POA: Diagnosis not present

## 2019-04-15 DIAGNOSIS — H04123 Dry eye syndrome of bilateral lacrimal glands: Secondary | ICD-10-CM | POA: Diagnosis not present

## 2019-04-15 DIAGNOSIS — M19042 Primary osteoarthritis, left hand: Secondary | ICD-10-CM | POA: Diagnosis not present

## 2019-04-15 DIAGNOSIS — L89322 Pressure ulcer of left buttock, stage 2: Secondary | ICD-10-CM | POA: Diagnosis not present

## 2019-04-15 DIAGNOSIS — M4802 Spinal stenosis, cervical region: Secondary | ICD-10-CM | POA: Diagnosis not present

## 2019-04-15 DIAGNOSIS — M50222 Other cervical disc displacement at C5-C6 level: Secondary | ICD-10-CM | POA: Diagnosis not present

## 2019-04-15 DIAGNOSIS — E1142 Type 2 diabetes mellitus with diabetic polyneuropathy: Secondary | ICD-10-CM | POA: Diagnosis not present

## 2019-04-15 DIAGNOSIS — L03317 Cellulitis of buttock: Secondary | ICD-10-CM | POA: Diagnosis not present

## 2019-04-15 DIAGNOSIS — M50223 Other cervical disc displacement at C6-C7 level: Secondary | ICD-10-CM | POA: Diagnosis not present

## 2019-04-15 DIAGNOSIS — G4733 Obstructive sleep apnea (adult) (pediatric): Secondary | ICD-10-CM | POA: Diagnosis not present

## 2019-04-15 DIAGNOSIS — H9190 Unspecified hearing loss, unspecified ear: Secondary | ICD-10-CM | POA: Diagnosis not present

## 2019-04-15 DIAGNOSIS — J9601 Acute respiratory failure with hypoxia: Secondary | ICD-10-CM | POA: Diagnosis not present

## 2019-04-15 DIAGNOSIS — B9562 Methicillin resistant Staphylococcus aureus infection as the cause of diseases classified elsewhere: Secondary | ICD-10-CM | POA: Diagnosis not present

## 2019-04-15 DIAGNOSIS — F329 Major depressive disorder, single episode, unspecified: Secondary | ICD-10-CM | POA: Diagnosis not present

## 2019-04-15 DIAGNOSIS — M50221 Other cervical disc displacement at C4-C5 level: Secondary | ICD-10-CM | POA: Diagnosis not present

## 2019-04-15 DIAGNOSIS — M19041 Primary osteoarthritis, right hand: Secondary | ICD-10-CM | POA: Diagnosis not present

## 2019-04-15 DIAGNOSIS — G20 Parkinson's disease: Secondary | ICD-10-CM | POA: Diagnosis not present

## 2019-04-15 DIAGNOSIS — I110 Hypertensive heart disease with heart failure: Secondary | ICD-10-CM | POA: Diagnosis not present

## 2019-04-15 DIAGNOSIS — L02215 Cutaneous abscess of perineum: Secondary | ICD-10-CM | POA: Diagnosis not present

## 2019-04-15 DIAGNOSIS — M5416 Radiculopathy, lumbar region: Secondary | ICD-10-CM | POA: Diagnosis not present

## 2019-04-15 DIAGNOSIS — I5031 Acute diastolic (congestive) heart failure: Secondary | ICD-10-CM | POA: Diagnosis not present

## 2019-04-15 DIAGNOSIS — K5730 Diverticulosis of large intestine without perforation or abscess without bleeding: Secondary | ICD-10-CM | POA: Diagnosis not present

## 2019-04-15 DIAGNOSIS — A419 Sepsis, unspecified organism: Secondary | ICD-10-CM | POA: Diagnosis not present

## 2019-04-16 DIAGNOSIS — I10 Essential (primary) hypertension: Secondary | ICD-10-CM | POA: Diagnosis not present

## 2019-04-16 DIAGNOSIS — E1140 Type 2 diabetes mellitus with diabetic neuropathy, unspecified: Secondary | ICD-10-CM | POA: Diagnosis not present

## 2019-04-16 DIAGNOSIS — R51 Headache: Secondary | ICD-10-CM | POA: Diagnosis not present

## 2019-04-16 DIAGNOSIS — F320 Major depressive disorder, single episode, mild: Secondary | ICD-10-CM | POA: Diagnosis not present

## 2019-04-16 DIAGNOSIS — L8990 Pressure ulcer of unspecified site, unspecified stage: Secondary | ICD-10-CM | POA: Diagnosis not present

## 2019-04-22 DIAGNOSIS — L89320 Pressure ulcer of left buttock, unstageable: Secondary | ICD-10-CM | POA: Diagnosis not present

## 2019-04-24 DIAGNOSIS — H00012 Hordeolum externum right lower eyelid: Secondary | ICD-10-CM | POA: Diagnosis not present

## 2019-04-24 DIAGNOSIS — G1220 Motor neuron disease, unspecified: Secondary | ICD-10-CM | POA: Diagnosis not present

## 2019-04-24 DIAGNOSIS — L8990 Pressure ulcer of unspecified site, unspecified stage: Secondary | ICD-10-CM | POA: Diagnosis not present

## 2019-04-29 DIAGNOSIS — M50222 Other cervical disc displacement at C5-C6 level: Secondary | ICD-10-CM | POA: Diagnosis not present

## 2019-04-29 DIAGNOSIS — L02215 Cutaneous abscess of perineum: Secondary | ICD-10-CM | POA: Diagnosis not present

## 2019-04-29 DIAGNOSIS — M19042 Primary osteoarthritis, left hand: Secondary | ICD-10-CM | POA: Diagnosis not present

## 2019-04-29 DIAGNOSIS — L89322 Pressure ulcer of left buttock, stage 2: Secondary | ICD-10-CM | POA: Diagnosis not present

## 2019-04-29 DIAGNOSIS — L03317 Cellulitis of buttock: Secondary | ICD-10-CM | POA: Diagnosis not present

## 2019-04-29 DIAGNOSIS — H9190 Unspecified hearing loss, unspecified ear: Secondary | ICD-10-CM | POA: Diagnosis not present

## 2019-04-29 DIAGNOSIS — H04123 Dry eye syndrome of bilateral lacrimal glands: Secondary | ICD-10-CM | POA: Diagnosis not present

## 2019-04-29 DIAGNOSIS — I5031 Acute diastolic (congestive) heart failure: Secondary | ICD-10-CM | POA: Diagnosis not present

## 2019-04-29 DIAGNOSIS — G4733 Obstructive sleep apnea (adult) (pediatric): Secondary | ICD-10-CM | POA: Diagnosis not present

## 2019-04-29 DIAGNOSIS — M5416 Radiculopathy, lumbar region: Secondary | ICD-10-CM | POA: Diagnosis not present

## 2019-04-29 DIAGNOSIS — K5730 Diverticulosis of large intestine without perforation or abscess without bleeding: Secondary | ICD-10-CM | POA: Diagnosis not present

## 2019-04-29 DIAGNOSIS — A419 Sepsis, unspecified organism: Secondary | ICD-10-CM | POA: Diagnosis not present

## 2019-04-29 DIAGNOSIS — B9562 Methicillin resistant Staphylococcus aureus infection as the cause of diseases classified elsewhere: Secondary | ICD-10-CM | POA: Diagnosis not present

## 2019-04-29 DIAGNOSIS — I110 Hypertensive heart disease with heart failure: Secondary | ICD-10-CM | POA: Diagnosis not present

## 2019-04-29 DIAGNOSIS — G20 Parkinson's disease: Secondary | ICD-10-CM | POA: Diagnosis not present

## 2019-04-29 DIAGNOSIS — M19041 Primary osteoarthritis, right hand: Secondary | ICD-10-CM | POA: Diagnosis not present

## 2019-04-29 DIAGNOSIS — G231 Progressive supranuclear ophthalmoplegia [Steele-Richardson-Olszewski]: Secondary | ICD-10-CM | POA: Diagnosis not present

## 2019-04-29 DIAGNOSIS — N179 Acute kidney failure, unspecified: Secondary | ICD-10-CM | POA: Diagnosis not present

## 2019-04-29 DIAGNOSIS — M50223 Other cervical disc displacement at C6-C7 level: Secondary | ICD-10-CM | POA: Diagnosis not present

## 2019-04-29 DIAGNOSIS — J9601 Acute respiratory failure with hypoxia: Secondary | ICD-10-CM | POA: Diagnosis not present

## 2019-04-29 DIAGNOSIS — M50221 Other cervical disc displacement at C4-C5 level: Secondary | ICD-10-CM | POA: Diagnosis not present

## 2019-04-29 DIAGNOSIS — F329 Major depressive disorder, single episode, unspecified: Secondary | ICD-10-CM | POA: Diagnosis not present

## 2019-04-29 DIAGNOSIS — M4802 Spinal stenosis, cervical region: Secondary | ICD-10-CM | POA: Diagnosis not present

## 2019-04-29 DIAGNOSIS — E1142 Type 2 diabetes mellitus with diabetic polyneuropathy: Secondary | ICD-10-CM | POA: Diagnosis not present

## 2019-05-08 ENCOUNTER — Other Ambulatory Visit: Payer: PPO | Admitting: Internal Medicine

## 2019-05-08 ENCOUNTER — Other Ambulatory Visit: Payer: Self-pay

## 2019-05-08 DIAGNOSIS — G231 Progressive supranuclear ophthalmoplegia [Steele-Richardson-Olszewski]: Secondary | ICD-10-CM

## 2019-05-08 DIAGNOSIS — Z515 Encounter for palliative care: Secondary | ICD-10-CM

## 2019-05-08 DIAGNOSIS — L89320 Pressure ulcer of left buttock, unstageable: Secondary | ICD-10-CM | POA: Diagnosis not present

## 2019-05-14 DIAGNOSIS — R51 Headache: Secondary | ICD-10-CM | POA: Diagnosis not present

## 2019-05-14 DIAGNOSIS — I10 Essential (primary) hypertension: Secondary | ICD-10-CM | POA: Diagnosis not present

## 2019-05-14 DIAGNOSIS — F320 Major depressive disorder, single episode, mild: Secondary | ICD-10-CM | POA: Diagnosis not present

## 2019-05-26 DIAGNOSIS — Z96619 Presence of unspecified artificial shoulder joint: Secondary | ICD-10-CM | POA: Diagnosis not present

## 2019-05-26 DIAGNOSIS — M4802 Spinal stenosis, cervical region: Secondary | ICD-10-CM | POA: Diagnosis not present

## 2019-05-26 DIAGNOSIS — E661 Drug-induced obesity: Secondary | ICD-10-CM | POA: Diagnosis not present

## 2019-05-26 DIAGNOSIS — M5416 Radiculopathy, lumbar region: Secondary | ICD-10-CM | POA: Diagnosis not present

## 2019-06-02 ENCOUNTER — Telehealth: Payer: Self-pay

## 2019-06-02 NOTE — Telephone Encounter (Signed)
Phone call placed to patient's son to check in. Son shared that patient had lost her cell phone and is now using her home number

## 2019-06-02 NOTE — Telephone Encounter (Signed)
Phone call placed to patient to check in and offer to schedule a visit with Palliative Care. Number no longer in service

## 2019-06-02 NOTE — Telephone Encounter (Signed)
Spoke with patient and caregiver. Visit with Enid Derry NP scheduled for 07/03/2019 @ 10:30am

## 2019-06-12 DIAGNOSIS — Z9181 History of falling: Secondary | ICD-10-CM | POA: Diagnosis not present

## 2019-06-12 DIAGNOSIS — Z0000 Encounter for general adult medical examination without abnormal findings: Secondary | ICD-10-CM | POA: Diagnosis not present

## 2019-06-12 DIAGNOSIS — G1220 Motor neuron disease, unspecified: Secondary | ICD-10-CM | POA: Diagnosis not present

## 2019-06-12 DIAGNOSIS — I10 Essential (primary) hypertension: Secondary | ICD-10-CM | POA: Diagnosis not present

## 2019-06-16 DIAGNOSIS — E1140 Type 2 diabetes mellitus with diabetic neuropathy, unspecified: Secondary | ICD-10-CM | POA: Diagnosis not present

## 2019-06-16 DIAGNOSIS — B372 Candidiasis of skin and nail: Secondary | ICD-10-CM | POA: Diagnosis not present

## 2019-06-16 DIAGNOSIS — M170 Bilateral primary osteoarthritis of knee: Secondary | ICD-10-CM | POA: Diagnosis not present

## 2019-06-16 DIAGNOSIS — E559 Vitamin D deficiency, unspecified: Secondary | ICD-10-CM | POA: Diagnosis not present

## 2019-06-16 DIAGNOSIS — H6591 Unspecified nonsuppurative otitis media, right ear: Secondary | ICD-10-CM | POA: Diagnosis not present

## 2019-06-16 DIAGNOSIS — M4802 Spinal stenosis, cervical region: Secondary | ICD-10-CM | POA: Diagnosis not present

## 2019-06-16 DIAGNOSIS — H538 Other visual disturbances: Secondary | ICD-10-CM | POA: Diagnosis not present

## 2019-06-16 DIAGNOSIS — L980 Pyogenic granuloma: Secondary | ICD-10-CM | POA: Diagnosis not present

## 2019-06-16 DIAGNOSIS — G20 Parkinson's disease: Secondary | ICD-10-CM | POA: Diagnosis not present

## 2019-06-16 DIAGNOSIS — L02215 Cutaneous abscess of perineum: Secondary | ICD-10-CM | POA: Diagnosis not present

## 2019-06-16 DIAGNOSIS — I509 Heart failure, unspecified: Secondary | ICD-10-CM | POA: Diagnosis not present

## 2019-06-16 DIAGNOSIS — N200 Calculus of kidney: Secondary | ICD-10-CM | POA: Diagnosis not present

## 2019-06-16 DIAGNOSIS — F320 Major depressive disorder, single episode, mild: Secondary | ICD-10-CM | POA: Diagnosis not present

## 2019-06-16 DIAGNOSIS — J329 Chronic sinusitis, unspecified: Secondary | ICD-10-CM | POA: Diagnosis not present

## 2019-06-16 DIAGNOSIS — E538 Deficiency of other specified B group vitamins: Secondary | ICD-10-CM | POA: Diagnosis not present

## 2019-06-16 DIAGNOSIS — M7541 Impingement syndrome of right shoulder: Secondary | ICD-10-CM | POA: Diagnosis not present

## 2019-06-16 DIAGNOSIS — M47812 Spondylosis without myelopathy or radiculopathy, cervical region: Secondary | ICD-10-CM | POA: Diagnosis not present

## 2019-06-16 DIAGNOSIS — J309 Allergic rhinitis, unspecified: Secondary | ICD-10-CM | POA: Diagnosis not present

## 2019-06-16 DIAGNOSIS — I951 Orthostatic hypotension: Secondary | ICD-10-CM | POA: Diagnosis not present

## 2019-06-16 DIAGNOSIS — N178 Other acute kidney failure: Secondary | ICD-10-CM | POA: Diagnosis not present

## 2019-06-16 DIAGNOSIS — M19011 Primary osteoarthritis, right shoulder: Secondary | ICD-10-CM | POA: Diagnosis not present

## 2019-06-16 DIAGNOSIS — E782 Mixed hyperlipidemia: Secondary | ICD-10-CM | POA: Diagnosis not present

## 2019-06-16 DIAGNOSIS — G609 Hereditary and idiopathic neuropathy, unspecified: Secondary | ICD-10-CM | POA: Diagnosis not present

## 2019-06-16 DIAGNOSIS — I110 Hypertensive heart disease with heart failure: Secondary | ICD-10-CM | POA: Diagnosis not present

## 2019-06-23 DIAGNOSIS — D485 Neoplasm of uncertain behavior of skin: Secondary | ICD-10-CM | POA: Diagnosis not present

## 2019-06-23 DIAGNOSIS — L728 Other follicular cysts of the skin and subcutaneous tissue: Secondary | ICD-10-CM | POA: Diagnosis not present

## 2019-06-23 DIAGNOSIS — Z1283 Encounter for screening for malignant neoplasm of skin: Secondary | ICD-10-CM | POA: Diagnosis not present

## 2019-06-23 DIAGNOSIS — D225 Melanocytic nevi of trunk: Secondary | ICD-10-CM | POA: Diagnosis not present

## 2019-06-29 DIAGNOSIS — G20 Parkinson's disease: Secondary | ICD-10-CM | POA: Diagnosis not present

## 2019-06-29 DIAGNOSIS — E782 Mixed hyperlipidemia: Secondary | ICD-10-CM | POA: Diagnosis not present

## 2019-06-29 DIAGNOSIS — H6591 Unspecified nonsuppurative otitis media, right ear: Secondary | ICD-10-CM | POA: Diagnosis not present

## 2019-06-29 DIAGNOSIS — I509 Heart failure, unspecified: Secondary | ICD-10-CM | POA: Diagnosis not present

## 2019-06-29 DIAGNOSIS — E1140 Type 2 diabetes mellitus with diabetic neuropathy, unspecified: Secondary | ICD-10-CM | POA: Diagnosis not present

## 2019-06-29 DIAGNOSIS — M7541 Impingement syndrome of right shoulder: Secondary | ICD-10-CM | POA: Diagnosis not present

## 2019-06-29 DIAGNOSIS — L02215 Cutaneous abscess of perineum: Secondary | ICD-10-CM | POA: Diagnosis not present

## 2019-06-29 DIAGNOSIS — L980 Pyogenic granuloma: Secondary | ICD-10-CM | POA: Diagnosis not present

## 2019-06-29 DIAGNOSIS — M47812 Spondylosis without myelopathy or radiculopathy, cervical region: Secondary | ICD-10-CM | POA: Diagnosis not present

## 2019-06-29 DIAGNOSIS — I951 Orthostatic hypotension: Secondary | ICD-10-CM | POA: Diagnosis not present

## 2019-06-29 DIAGNOSIS — B372 Candidiasis of skin and nail: Secondary | ICD-10-CM | POA: Diagnosis not present

## 2019-06-29 DIAGNOSIS — M4802 Spinal stenosis, cervical region: Secondary | ICD-10-CM | POA: Diagnosis not present

## 2019-06-29 DIAGNOSIS — J329 Chronic sinusitis, unspecified: Secondary | ICD-10-CM | POA: Diagnosis not present

## 2019-06-29 DIAGNOSIS — J309 Allergic rhinitis, unspecified: Secondary | ICD-10-CM | POA: Diagnosis not present

## 2019-06-29 DIAGNOSIS — M19011 Primary osteoarthritis, right shoulder: Secondary | ICD-10-CM | POA: Diagnosis not present

## 2019-06-29 DIAGNOSIS — H538 Other visual disturbances: Secondary | ICD-10-CM | POA: Diagnosis not present

## 2019-06-29 DIAGNOSIS — G609 Hereditary and idiopathic neuropathy, unspecified: Secondary | ICD-10-CM | POA: Diagnosis not present

## 2019-06-29 DIAGNOSIS — F320 Major depressive disorder, single episode, mild: Secondary | ICD-10-CM | POA: Diagnosis not present

## 2019-06-29 DIAGNOSIS — N178 Other acute kidney failure: Secondary | ICD-10-CM | POA: Diagnosis not present

## 2019-06-29 DIAGNOSIS — E538 Deficiency of other specified B group vitamins: Secondary | ICD-10-CM | POA: Diagnosis not present

## 2019-06-29 DIAGNOSIS — N200 Calculus of kidney: Secondary | ICD-10-CM | POA: Diagnosis not present

## 2019-06-29 DIAGNOSIS — I110 Hypertensive heart disease with heart failure: Secondary | ICD-10-CM | POA: Diagnosis not present

## 2019-06-29 DIAGNOSIS — E559 Vitamin D deficiency, unspecified: Secondary | ICD-10-CM | POA: Diagnosis not present

## 2019-06-29 DIAGNOSIS — M170 Bilateral primary osteoarthritis of knee: Secondary | ICD-10-CM | POA: Diagnosis not present

## 2019-07-03 ENCOUNTER — Other Ambulatory Visit: Payer: Self-pay

## 2019-07-03 ENCOUNTER — Other Ambulatory Visit: Payer: PPO | Admitting: Internal Medicine

## 2019-07-14 ENCOUNTER — Other Ambulatory Visit: Payer: Self-pay

## 2019-07-14 ENCOUNTER — Encounter: Payer: Self-pay | Admitting: Adult Health

## 2019-07-14 ENCOUNTER — Ambulatory Visit (INDEPENDENT_AMBULATORY_CARE_PROVIDER_SITE_OTHER): Payer: PPO | Admitting: Adult Health

## 2019-07-14 VITALS — BP 130/79 | HR 85 | Temp 98.4°F | Ht 61.5 in | Wt 184.6 lb

## 2019-07-14 DIAGNOSIS — G4733 Obstructive sleep apnea (adult) (pediatric): Secondary | ICD-10-CM

## 2019-07-14 DIAGNOSIS — Z9989 Dependence on other enabling machines and devices: Secondary | ICD-10-CM | POA: Diagnosis not present

## 2019-07-14 NOTE — Patient Instructions (Signed)
Your Plan:  Restart CPAP therapy If your symptoms worsen or you develop new symptoms please let us know.   Thank you for coming to see [place] at [facility]. I hope we have been able to provide you high quality care today.  You may receive a patient satisfaction survey over the next few weeks. We would appreciate your feedback and comments so that we may continue to improve ourselves and the health of our patients.

## 2019-07-14 NOTE — Progress Notes (Addendum)
PATIENT: [name] [name] DOB: Jun 14, 1951  REASON FOR VISIT: follow up HISTORY FROM: patient  HISTORY OF PRESENT ILLNESS: Today 07/14/19:  [name] [name] is a 68 year old female with a history of obstructive sleep apnea on CPAP.  She returns today for follow-up.  She states that she has not restarted using her CPAP.  She states that when they have checked her oxygen it was 98% so she did not think she needed the CPAP any longer.  I explained the purpose of the CPAP and the risk associated with untreated sleep apnea.  She voiced understanding and states that she plans to restart this.  HISTORY 02/26/19 per Amy Lomax's note: [name] [name] is a 68 y.o. female for follow up of OSA on CPAP.  During telephone conference patient is having difficulty hearing as well as suffers from dysarthria being treated by speech therapy.  Her friend, [name], assistance in obtaining information today.  Patient has consented.  She reports that over the last 2 months she has been unable to use her CPAP much at all due to some sinus trouble.  Recently she was seen by her PCP and was started on sinus medication.  She feels that this is been helping.  She plans to continue use of CPAP.  She has no other concerns today.  REVIEW OF SYSTEMS: Out of a complete 14 system review of symptoms, the patient complains only of the following symptoms, and all other reviewed systems are negative.  See HPI  ALLERGIES: Allergies  Allergen Reactions   Aleve [Naproxen] Hives, Shortness Of Breath and Rash    Pt Avoids All Nsaids   Protonix [Pantoprazole Sodium] Other (See Comments)    Makes her feel wreidd   Zocor [Simvastatin] Other (See Comments)    "weird feeling all over body"    HOME MEDICATIONS: Outpatient Medications Prior to Visit  Medication Sig Dispense Refill   acetaminophen (TYLENOL) 325 MG tablet Take 650 mg by mouth 2 (two) times daily.      aspirin 81 MG chewable tablet Chew 81 mg by mouth daily.      azelastine (ASTELIN) 0.1 % nasal spray Place 2 sprays into both nostrils 2 (two) times daily.   11   carbidopa-levodopa (SINEMET IR) 25-100 MG tablet TAKE 2 TABLETS BY MOUTH 3 TIMES A DAY 540 tablet 1   Cholecalciferol (VITAMIN D PO) Take 5,000 Units by mouth daily.     cyanocobalamin 500 MCG tablet Take 500 mcg by mouth daily.     diclofenac sodium (VOLTAREN) 1 % GEL Apply 4 g topically 2 (two) times daily.      escitalopram (LEXAPRO) 10 MG tablet Take 15 mg by mouth daily.   2   fenofibrate 160 MG tablet Take 160 mg by mouth every morning.      fluticasone (FLONASE) 50 MCG/ACT nasal spray Place 2 sprays into both nostrils daily.   11   loratadine (CLARITIN) 10 MG tablet Take 10 mg by mouth daily.      Menthol, Topical Analgesic, (BIOFREEZE EX) Apply topically.     montelukast (SINGULAIR) 10 MG tablet Take 10 mg by mouth every evening.      nystatin cream (MYCOSTATIN) Apply 1 application topically 2 (two) times daily.      olmesartan (BENICAR) 40 MG tablet Take 40 mg by mouth daily.     traMADol (ULTRAM) 50 MG tablet Take 50 mg by mouth every 6 (six) hours as needed.     Melatonin 5 MG CAPS Take  10 mg by mouth at bedtime as needed.      mupirocin cream (BACTROBAN) 2 % Apply topically 2 (two) times daily. (Patient not taking: Reported on 07/14/2019) 15 g 0   No facility-administered medications prior to visit.     PAST MEDICAL HISTORY: Past Medical History:  Diagnosis Date   Acute kidney failure (HCC)    hx of    Arthritis    fingers,right shoulder   Bradycardia    Bulge of cervical disc without myelopathy    C4 -- C7  and stenosis   CHF (congestive heart failure) (HCC)    acute diastolic ( congestive ) heart failure)    Chronic lumbar radiculopathy    L5- S1  right leg   Constipation    Depression    Diverticulosis large intestine w/o perforation or abscess w/bleeding    Dizziness    Dry eye syndrome of bilateral lacrimal glands    Frequency of  urination    GERD (gastroesophageal reflux disease)    Hard of hearing    History of kidney stones 05/12/15   surgery    HTN (hypertension)    Hyperlipidemia    Hypokalemia    Morbid obesity due to excess calories (HCC)    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HCC)    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HCC)    Numbness and tingling in hands    OSA (obstructive sleep apnea)    moderate OSA per study 11-22-2007--  refused CPAP but used oxygen for 6 months at night,  states due to wt loss stopped using oxygen   Polyneuropathy, diabetic (HCC)    WALKS W/ CANE FOR BALANCE   Progressive supranuclear ophthalmoplegia (HCC)    Progressive supranuclear palsy (HCC)    Pyonephrosis    Pyonephrosis    Radiculopathy of lumbar region    Respiratory failure (HCC)    hx of acute respiratory failure wtih hypoxia    Right ureteral stone    Severe sepsis with septic shock (CODE) (HCC)    Shortness of breath dyspnea    Spinal stenosis, cervical region    SUI (stress urinary incontinence, female)    Tubulo-interstitial nephritis    Type 2 diabetes mellitus (HCC)    Type II - Diet controlled   Urinary incontinence    Urinary tract infection    Wears glasses    Wound of gluteal cleft    Left    PAST SURGICAL HISTORY: Past Surgical History:  Procedure Laterality Date   ANTERIOR CERVICAL DECOMP/DISCECTOMY FUSION N/A 03/23/2016   Procedure: Cervical Four-Five, Cervical Five-Six, Cervical Six-Seven Anterior cervical decompression/diskectomy/fusion;  Surgeon: Leeroy Cha, MD;  Location: [facility];  Service: Neurosurgery;  Laterality: N/A;  C4-5 C5-6 C6-7 Anterior cervical decompression/diskectomy/fusion   CARDIOVASCULAR STRESS TEST  09-30-2007     normal Adenosine study/  no ischemia /  normal LV function and wall motion , ef 82%   COLONOSCOPY     CYSTOSCOPY WITH RETROGRADE PYELOGRAM, URETEROSCOPY AND STENT PLACEMENT Right 05/12/2015   Procedure: CYSTOSCOPY WITH RETROGRADE  PYELOGRAM, URETEROSCOPY,STONE EXTRACTION AND STENT PLACEMENT;  Surgeon: Franchot Gallo, MD;  Location: [facility];  Service: Urology;  Laterality: Right;   CYSTOSCOPY WITH RETROGRADE PYELOGRAM, URETEROSCOPY AND STENT PLACEMENT Right 04/25/2018   Procedure: CYSTOSCOPY WITH RETROGRADE PYELOGRAM, URETEROSCOPY AND STENT EXCHANGE;  Surgeon: Alexis Frock, MD;  Location: [facility];  Service: Urology;  Laterality: Right;   CYSTOSCOPY WITH STENT PLACEMENT Right 03/28/2018   Procedure: CYSTOSCOPY WITH STENT PLACEMENT retrograde pylegram;  Surgeon: Alexis Frock, MD;  Location: [facility];  Service: Urology;  Laterality: Right;   EXTRACORPOREAL SHOCK WAVE LITHOTRIPSY Right 08-18-2012   HOLMIUM LASER APPLICATION Right [id number]   Procedure: HOLMIUM LASER APPLICATION;  Surgeon: Franchot Gallo, MD;  Location[facility];  Service: Urology;  Laterality: Right;   HOLMIUM LASER APPLICATION Right [id number]   Procedure: HOLMIUM LASER APPLICATION;  Surgeon: Alexis Frock, MD;  Location: [facility];  Service: Urology;  Laterality: Right;   ORIF HUMERUS FRACTURE Right 07/20/2016   Procedure: OPEN REDUCTION INTERNAL FIXATION (ORIF) PROXIMAL HUMERUS FRACTURE VS REVERSE TOTAL SHOULDER ARTHROPLASTY;  Surgeon: Netta Cedars, MD;  Location: [place];  Service: Orthopedics;  Laterality: Right;   SHOULDER ARTHROSCOPY Right 07/2016   TRANSTHORACIC ECHOCARDIOGRAM  09-23-2007   pseudonormal LV filling pattern,  ef 65-70%/  trivial AR/  mild LAE   TUBAL LIGATION  1985   WOUND DEBRIDEMENT Left 03/10/2019   Procedure: EXCISION AND DEBRIDEMENT LEFT PERINEAL WOUND;  Surgeon: Jovita Kussmaul, MD;  Location: [facility];  Service: General;  Laterality: Left;    FAMILY HISTORY: Family History  Problem Relation Age of Onset   Heart Problems Father    Stroke Mother    Hypertension Other    Stroke Other    Breast cancer Sister     SOCIAL HISTORY: Social History   Socioeconomic History    Marital status: Widowed    Spouse name: Not on file   Number of children: 1   Years of education: College   Highest education level: Not on file  Occupational History   Occupation: Glass blower/designer    Employer: [employer] resource strain: Not on file   Food insecurity    Worry: Not on file    Inability: Not on file   Transportation needs    Medical: Not on file    Non-medical: Not on file  Tobacco Use   Smoking status: Never Smoker   Smokeless tobacco: Never Used  Substance and Sexual Activity   Alcohol use: Yes    Alcohol/week: 0.0 standard drinks    Comment: 3- 4 times a year   Drug use: No   Sexual activity: Not on file  Lifestyle   Physical activity    Days per week: Not on file    Minutes per session: Not on file   Stress: Not on file  Relationships   Social connections    Talks on phone: Not on file    Gets together: Not on file    Attends religious service: Not on file    Active member of club or organization: Not on file    Attends meetings of clubs or organizations: Not on file    Relationship status: Not on file   Intimate partner violence    Fear of current or ex partner: Not on file    Emotionally abused: Not on file    Physically abused: Not on file    Forced sexual activity: Not on file  Other Topics Concern   Not on file  Social History Narrative   Patient lives at home with her dog name "Hot Rod".   Caffeine Use: 2-3 cups of coffee a day       PHYSICAL EXAM  Vitals:   07/14/19 1004  BP: 130/79  Pulse: 85  Temp: 98.4 F (36.9 C)  TempSrc: Oral  Weight: 184 lb 9.6 oz (83.7 kg)  Height: 5' 1.5" (1.562 m)   Body mass index  is 34.32 kg/m.  Generalized: Well developed, in no acute distress   Neurological examination  Mentation: Alert oriented to time, place, history taking. Follows all commands speech garbled at times. Cranial nerves: Extraocular movements were full, visual field were full  on confrontational test.Head turning and shoulder shrug  were normal and symmetric. Motor: The motor testing reveals 5 over 5 strength of all 4 extremities. Good symmetric motor tone is noted throughout.  Gait and station: in a wheelchair  DIAGNOSTIC DATA (LABS, IMAGING, TESTING) - I reviewed patient records, labs, notes, testing and imaging myself where available.  Lab Results  Component Value Date   WBC 10.4 03/11/2019   HGB 10.0 (L) 03/11/2019   HCT 30.5 (L) 03/11/2019   MCV 90.5 03/11/2019   PLT 430 (H) 03/11/2019      Component Value Date/Time   NA 140 03/12/2019 0330   K 3.8 03/12/2019 0330   CL 102 03/12/2019 0330   CO2 28 03/12/2019 0330   GLUCOSE 116 (H) 03/12/2019 0330   BUN 15 03/12/2019 0330   CREATININE 0.62 03/12/2019 0330   CALCIUM 9.4 03/12/2019 0330   PROT 5.8 (L) 03/09/2019 2114   ALBUMIN 2.4 (L) 03/09/2019 2114   AST 15 03/09/2019 2114   ALT <5 03/09/2019 2114   ALKPHOS 67 03/09/2019 2114   BILITOT 0.6 03/09/2019 2114   GFRNONAA >60 03/12/2019 0330   GFRAA >60 03/12/2019 0330    Lab Results  Component Value Date   HGBA1C 5.7 (H) 03/06/2019   Lab Results  Component Value Date   VITAMINB12 313 12/29/2015   Lab Results  Component Value Date   TSH 0.989 03/28/2018      ASSESSMENT AND PLAN 68 y.o. 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, CHF (congestive heart failure) ([place]), Chronic lumbar radiculopathy, Constipation, Depression, Diverticulosis large intestine w/o perforation or abscess w/bleeding, Dizziness, Dry eye syndrome of bilateral lacrimal glands, Frequency of urination, GERD (gastroesophageal reflux disease), Hard of hearing, History of kidney stones (05/12/15), HTN (hypertension), Hyperlipidemia, Hypokalemia, Morbid obesity due to excess calories ([place]), 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[place]), 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[place]), Numbness and tingling in  hands, OSA (obstructive sleep apnea), Polyneuropathy, diabetic ([place]), Progressive supranuclear ophthalmoplegia ([place]), Progressive supranuclear palsy ([place]), Pyonephrosis, Pyonephrosis, Radiculopathy of lumbar region, Respiratory failure ([place]), Right ureteral stone, Severe sepsis with septic shock (CODE) ([place]), Shortness of breath dyspnea, Spinal stenosis, cervical region, SUI (stress urinary incontinence, female), Tubulo-interstitial nephritis, Type 2 diabetes mellitus ([place]), Urinary incontinence, Urinary tract infection, Wears glasses, and Wound of gluteal cleft. here with :  1.  Obstructive sleep apnea   I explained the purpose behind using the CPAP.  We discussed risk associated with untreated sleep apnea.  I reviewed her sleep study with her.  She voiced that she will restart the CPAP.  She will follow-up in 6 months for another download.  I spent 15 minutes with the patient. 50% of this time was spent discussing risk associated with untreated sleep apnea   Ward Givens, MSN, NP-C 07/14/2019, 10:47 AM [facility] [address], [place], [place] [address] [phone]  I reviewed the above note and documentation by the Nurse Practitioner and agree with the history, exam, assessment and plan as outlined above. I was immediately available for consultation. Star Age, MD, PhD [facility])

## 2019-07-17 DIAGNOSIS — M19011 Primary osteoarthritis, right shoulder: Secondary | ICD-10-CM | POA: Diagnosis not present

## 2019-07-17 DIAGNOSIS — L980 Pyogenic granuloma: Secondary | ICD-10-CM | POA: Diagnosis not present

## 2019-07-17 DIAGNOSIS — M170 Bilateral primary osteoarthritis of knee: Secondary | ICD-10-CM | POA: Diagnosis not present

## 2019-07-17 DIAGNOSIS — E782 Mixed hyperlipidemia: Secondary | ICD-10-CM | POA: Diagnosis not present

## 2019-07-17 DIAGNOSIS — I509 Heart failure, unspecified: Secondary | ICD-10-CM | POA: Diagnosis not present

## 2019-07-17 DIAGNOSIS — E1140 Type 2 diabetes mellitus with diabetic neuropathy, unspecified: Secondary | ICD-10-CM | POA: Diagnosis not present

## 2019-07-17 DIAGNOSIS — H6591 Unspecified nonsuppurative otitis media, right ear: Secondary | ICD-10-CM | POA: Diagnosis not present

## 2019-07-17 DIAGNOSIS — E538 Deficiency of other specified B group vitamins: Secondary | ICD-10-CM | POA: Diagnosis not present

## 2019-07-17 DIAGNOSIS — F320 Major depressive disorder, single episode, mild: Secondary | ICD-10-CM | POA: Diagnosis not present

## 2019-07-17 DIAGNOSIS — E559 Vitamin D deficiency, unspecified: Secondary | ICD-10-CM | POA: Diagnosis not present

## 2019-07-17 DIAGNOSIS — M4802 Spinal stenosis, cervical region: Secondary | ICD-10-CM | POA: Diagnosis not present

## 2019-07-17 DIAGNOSIS — G609 Hereditary and idiopathic neuropathy, unspecified: Secondary | ICD-10-CM | POA: Diagnosis not present

## 2019-07-17 DIAGNOSIS — N200 Calculus of kidney: Secondary | ICD-10-CM | POA: Diagnosis not present

## 2019-07-17 DIAGNOSIS — M7541 Impingement syndrome of right shoulder: Secondary | ICD-10-CM | POA: Diagnosis not present

## 2019-07-17 DIAGNOSIS — N178 Other acute kidney failure: Secondary | ICD-10-CM | POA: Diagnosis not present

## 2019-07-17 DIAGNOSIS — L02215 Cutaneous abscess of perineum: Secondary | ICD-10-CM | POA: Diagnosis not present

## 2019-07-17 DIAGNOSIS — M47812 Spondylosis without myelopathy or radiculopathy, cervical region: Secondary | ICD-10-CM | POA: Diagnosis not present

## 2019-07-17 DIAGNOSIS — I110 Hypertensive heart disease with heart failure: Secondary | ICD-10-CM | POA: Diagnosis not present

## 2019-07-17 DIAGNOSIS — H538 Other visual disturbances: Secondary | ICD-10-CM | POA: Diagnosis not present

## 2019-07-17 DIAGNOSIS — B372 Candidiasis of skin and nail: Secondary | ICD-10-CM | POA: Diagnosis not present

## 2019-07-17 DIAGNOSIS — I951 Orthostatic hypotension: Secondary | ICD-10-CM | POA: Diagnosis not present

## 2019-07-17 DIAGNOSIS — G20 Parkinson's disease: Secondary | ICD-10-CM | POA: Diagnosis not present

## 2019-07-17 DIAGNOSIS — J329 Chronic sinusitis, unspecified: Secondary | ICD-10-CM | POA: Diagnosis not present

## 2019-07-17 DIAGNOSIS — J309 Allergic rhinitis, unspecified: Secondary | ICD-10-CM | POA: Diagnosis not present

## 2019-07-28 DIAGNOSIS — G4733 Obstructive sleep apnea (adult) (pediatric): Secondary | ICD-10-CM | POA: Diagnosis not present

## 2019-07-28 DIAGNOSIS — D2271 Melanocytic nevi of right lower limb, including hip: Secondary | ICD-10-CM | POA: Diagnosis not present

## 2019-07-28 DIAGNOSIS — D485 Neoplasm of uncertain behavior of skin: Secondary | ICD-10-CM | POA: Diagnosis not present

## 2019-07-29 DIAGNOSIS — G20 Parkinson's disease: Secondary | ICD-10-CM | POA: Diagnosis not present

## 2019-07-29 DIAGNOSIS — J309 Allergic rhinitis, unspecified: Secondary | ICD-10-CM | POA: Diagnosis not present

## 2019-07-29 DIAGNOSIS — J329 Chronic sinusitis, unspecified: Secondary | ICD-10-CM | POA: Diagnosis not present

## 2019-07-29 DIAGNOSIS — I110 Hypertensive heart disease with heart failure: Secondary | ICD-10-CM | POA: Diagnosis not present

## 2019-07-29 DIAGNOSIS — M170 Bilateral primary osteoarthritis of knee: Secondary | ICD-10-CM | POA: Diagnosis not present

## 2019-07-29 DIAGNOSIS — M19011 Primary osteoarthritis, right shoulder: Secondary | ICD-10-CM | POA: Diagnosis not present

## 2019-07-29 DIAGNOSIS — N178 Other acute kidney failure: Secondary | ICD-10-CM | POA: Diagnosis not present

## 2019-07-29 DIAGNOSIS — B372 Candidiasis of skin and nail: Secondary | ICD-10-CM | POA: Diagnosis not present

## 2019-07-29 DIAGNOSIS — M47812 Spondylosis without myelopathy or radiculopathy, cervical region: Secondary | ICD-10-CM | POA: Diagnosis not present

## 2019-07-29 DIAGNOSIS — L02215 Cutaneous abscess of perineum: Secondary | ICD-10-CM | POA: Diagnosis not present

## 2019-07-29 DIAGNOSIS — E538 Deficiency of other specified B group vitamins: Secondary | ICD-10-CM | POA: Diagnosis not present

## 2019-07-29 DIAGNOSIS — I509 Heart failure, unspecified: Secondary | ICD-10-CM | POA: Diagnosis not present

## 2019-07-29 DIAGNOSIS — G609 Hereditary and idiopathic neuropathy, unspecified: Secondary | ICD-10-CM | POA: Diagnosis not present

## 2019-07-29 DIAGNOSIS — F320 Major depressive disorder, single episode, mild: Secondary | ICD-10-CM | POA: Diagnosis not present

## 2019-07-29 DIAGNOSIS — H538 Other visual disturbances: Secondary | ICD-10-CM | POA: Diagnosis not present

## 2019-07-29 DIAGNOSIS — M7541 Impingement syndrome of right shoulder: Secondary | ICD-10-CM | POA: Diagnosis not present

## 2019-07-29 DIAGNOSIS — I951 Orthostatic hypotension: Secondary | ICD-10-CM | POA: Diagnosis not present

## 2019-07-29 DIAGNOSIS — M4802 Spinal stenosis, cervical region: Secondary | ICD-10-CM | POA: Diagnosis not present

## 2019-07-29 DIAGNOSIS — E782 Mixed hyperlipidemia: Secondary | ICD-10-CM | POA: Diagnosis not present

## 2019-07-29 DIAGNOSIS — N200 Calculus of kidney: Secondary | ICD-10-CM | POA: Diagnosis not present

## 2019-07-29 DIAGNOSIS — E1140 Type 2 diabetes mellitus with diabetic neuropathy, unspecified: Secondary | ICD-10-CM | POA: Diagnosis not present

## 2019-07-29 DIAGNOSIS — E559 Vitamin D deficiency, unspecified: Secondary | ICD-10-CM | POA: Diagnosis not present

## 2019-07-29 DIAGNOSIS — L980 Pyogenic granuloma: Secondary | ICD-10-CM | POA: Diagnosis not present

## 2019-07-29 DIAGNOSIS — H6591 Unspecified nonsuppurative otitis media, right ear: Secondary | ICD-10-CM | POA: Diagnosis not present

## 2019-08-05 ENCOUNTER — Telehealth: Payer: Self-pay | Admitting: Adult Health

## 2019-08-05 NOTE — Telephone Encounter (Signed)
Pt's friend Lenox Ponds called wanting for RN to call back to discuss the pt's cpap. They are having trouble with it. Please advise.

## 2019-08-05 NOTE — Telephone Encounter (Signed)
I called and spoke to [name] [name], her friend.  They were able to get new hose and are asking about pressures, what to set.  Her machine is 68 yrs old ( this may be done remotely by [employer]).  I encouraged him to call DME: [employer] to get assistance.  He had # for them.  They will call back as needed.

## 2019-08-06 DIAGNOSIS — G4733 Obstructive sleep apnea (adult) (pediatric): Secondary | ICD-10-CM | POA: Diagnosis not present

## 2019-08-06 NOTE — Progress Notes (Signed)
[name] [name] was seen today in the movement disorders clinic for neurologic consultation at the request of [name], MD.    The patient presents for a second opinion regarding falls and balance difficulties.  She has been seeing Dr. [name] since January, 2015 and most recently saw him on 11/25/2015 and saw his nurse practitioner on 12/14/2015.  I have taken a detailed review of the records from [facility] neurology.  Her initial complaint in January, 2015 was of dizziness and balance change.  By November, 2015, the patients balance change and falls had become such a problem that Dr. [name] told the patient that he thought it was unsafe for her to live alone.  He noted that the patient was very short stepped in her gait and she was off balance and antalgic.  Over the course of time, she continued to have repetitive falls.  As of August, 2016 the patient began to complain of drooling.  In November, 2016, the patient began to complain of tremor when holding a cup, which she states has increased.  01/26/16 update:  The patient presents today for follow-up.  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  She has continued to have falls and fell on February 23 ended up in the emergency room.  They did not do a CT of the brain.  Fortunately, she did not lose consciousness with that fall.  She did fall 3 times the day after that but none since.  She has had some dizziness with the medication.  She did have labwork at our last visit.  Her zinc was normal.  Vitamin D was normal.  Celiac panel was normal.  RPR was negative.  Vitamin B12 was slightly low at 313 and I asked her to start a B12 supplement.  Folate was normal.  The patient wanted to follow-up here a little bit earlier.  States that the diplopia is getting worse.  She had an MRI of the cervical spine on 01/16/16 that I reviewed.  There is degenerative changes and significant NFS and  C5 on the L, bilaterally at C6 and 7.  03/13/16 update:  The patient presents today for follow-up.  She is accompanied by her in-home caregiver who supplements the history.  Her caregiver now comes a few hours to days a week.  I have reviewed records since last visit.  The patient saw Dr. [name] on 02/06/2016 and his notes stated that there was no doubt that the patient had cervical stenosis but he was not sure how much the surgery would help.  He told the patient to follow back up with me and my recommendations.  I subsequently got a call from the patients primary care physician and she asked me the same question and I stated that while the surgery may help her pain, it was not going to help her falls, shuffling, or diplopia and the anesthesia could actually set the [place] back.  Last visit, I did increase her carbidopa/levodopa 25/100-2 tablets 3 times per day.  I offered to change her to the extended release to see if that would help the dizziness but she did not want to do that.  She eventually called me and stated that she was dizzy and stated that her primary care physician said that she may need discontinue the medication.  I asked her if she was sure that it was from the medication because dizziness was one of her  presenting complaints at onset in 2015.  I did tell her that she could hold the medication and see if the dizziness changed.  The patient states that she d/c the medication.  She states that she has fallen 4 times, 2 prior to the d/c of levodopa and 2 after.  She really noticed no change in the dizziness and in fact states that she is more dizzy now.  The patient did see Dr. [name] on April 20 in follow-up for her sleep apnea.  She states that she does not have to see Dr. [name] for another year and it was just recommended that she use her CPAP more and try melatonin.  06/15/16 update:  The patient presents today for follow-up.  This patient is accompanied in the office by her caregiver who supplements  the history.  She has a caregiver 2 days a week.   I have reviewed multiple records made available to me.  The patient had anterior cervical discectomy at the C4-C5, C5-C6, and C6-C7 levels on May 12.  She was discharged from the hospital on May 14.  She had no surgical complications.  She is on carbidopa/levodopa 25/100, 2 tablets in the morning, 2 in the afternoon and one in the evening.  She has had falls since surgery, the last of which was 7/26.  She was bent over to get dish washing detergent and fell on her knees.  She also fell on 7/5 and she tried to get up from the chair and her foot got caught.  She fell on 6/15 and she bruised her arm.  With the falls, the walker was present but she wasn't necessarily leaning on it at the time.  She is in PT right now 2-3 days a week.    10/16/16 update:  Pt presents for follow up.  She is accompanied by her caregiver and sister in law who supplement the history.She has MSA-C.  She is on carbidopa/levodopa 25/100, 2/2/1.  Asked me about driving last visit and I was leery because of diplopia so told her that she needed an ophthalmology evaluation.  She did go on 09/11/16 and they sent me handwritten notes but driving doesn't appear to have been addressed. She is supposed to get new glasses today.  She has not been driving much lately because of recent surgery, but admits that she did recently get released by her surgeon to drive and took a "test drive."  The records that were made available to me were reviewed.  Fell in August and sustained a humerus fx.  At that time, I recommend she stop walking all together and remain in a wheelchair at all times.  This was told to patient and her son (on the phone).  She underwent ORIF of the fx and a reverse total shoulder on 07/20/2016.  She had 24 hour/day care until this week and it was cut back on this week.  She admits that she still has been walking.  Getting choked on green tea/liquids.  "I'm having more laughter than I  should."  12/10/16 update:  Patient presents today for mobility evaluation.  She is accompanied by her caregiver.  A representative from advanced home care is also present.  The patient remains on carbidopa/levodopa 25/100, 2/2/1.  She did have a swallow study on 10/26/2016 that demonstrated mild oropharyngeal dysphagia, but no change the dietary recommendations were made at this time.  Regular diet with thin liquids was recommended.  Last visit and several times since, I talked  to her about the fact that I do not want her living alone and that I want her using a wheelchair at all times.  She has not been doing that and actually walked into the office today.  She states that she fell on jan 20 while trying to clean up something on the floor.  Had EMS eval her that day.  No LOC and no change in MS since then, which her caregiver agrees with.  She is moving this weekend to [facility] in [facility].  Her meds will be distributed.  She will get bathing assist.   05/23/17 update:  Patient presents for follow-up, accompanied by her sister-in-law and brother who supplements the history.  She is on carbidopa/levodopa 25/100, 2 tablets in the morning, 2 in the afternoon and one in the evening.  We did a mobility evaluation last visit and talked about the importance of staying seated at all times.  Unfortunately, she has not done that.  She presented to the hospital after a fall on 02/15/2017.  She did not hit her head.  I did get a note from Dr. Valetta Close dated 01/28/2017.  She saw him for worsening diplopia.  He recommended prisms.  They didn't help and she is supposed to see neuroopthalmology.   She moved again from [place] and is no longer in assisted living but is in retired living.  She has already had rib fx in her new place.  She has sitters at night and in the AM.  She has assistance with bathing and dressing and morning meals.    09/24/17 update: Patient seen today in follow-up for her MSA.  She is accompanied by  her sister in law who supplements the history.  I increased her carbidopa/levodopa 25/100 since her last visit so that she is now taking 2 tablets 3 times per day.  She has called a few times since last visit because of falls.  She was told that she should not be walking at all.  She is now in a motorized wheelchair, but was having difficulty with transfers and falling during the transfers.  She was told that she should not be alone for transfers.  Fortunately, she has not had any serious injuries.  She just had a recent fall.  She still has no one to help her with daytime toileting.  She is having trouble with swallowing and more coughing with food.    02/18/18 update: Patient is seen today in follow-up.  She is accompanied by her sister-in-law and brother who supplements the history.  She is on carbidopa/levodopa 25/100, 2 tablets 3 times per day.  She and her family have attended the new atypical support group.  Records have been reviewed since our last visit.  Modified barium swallow was completed on October 09, 2017 and demonstrated mild pharyngeal dysphasia.  Regular diet with thin liquids was recommended.  A GI evaluation was recommended for esophageal assessment.  She saw Dr. Halford Chessman on October 09, 2017.  Chest x-ray was ordered and that was negative.  She had pulmonary function testing in January, 2019 that was unremarkable.  She is on Flonase and Astelin and as needed Claritin.  She followed up with [facility] neurology on October 01, 2018 in regards to her sleep apnea.  CPAP download demonstrated suboptimal compliance.  She has had multiple falls since her last visit.  Reiterated many times the importance of not walking.  She has discussed with our social worker resources for higher level of care.  Having neck pain and was to have an MRI tomorrow and was considering surgery.  Her family asks about this today.  Having significant diplopia as well.  07/08/18 update: Patient is seen today in follow-up for  MSA.  She is accompanied by her friend [name] who supplements the history.  Patient is on carbidopa/levodopa 25/100, 2 tablets 3 times per day.  Records are reviewed since last visit.  Patient was in the hospital from May 17 to Apr 08, 2018.  She was found by family with mental status change.  Patient was septic due to urosepsis/pyelonephritis/urinary tract infection.  Patient required mechanical ventilation.  She recovered and went to subacute nursing facility for rehab.  She came back to the hospital on June 14 for cystoscopy and lithotripsy.  Son called since last visit about concerns about care in the subacute nursing facility, but also many questions about her disease and lifespan.  While he did talk with my Administrator, sports, it was encouraged for him to come to the appointment as we have discussed these issues with her extensively at many appointments.  She is back in the emergency room on July 16 after a fall requiring suture placement to the right forehead.  She is currently not at the subacute nursing facility.  She is in independent living.  She reports that her friend, [name], is with her most of the time during the day and a CNA is with her at night (7pm-7am).   Reports that most of the time she isn't walking "much" but is some.  Aides bathe her 3 times per week.  Swallowing is "ok" and friend [name] hasn't noted choking but she does cough a lot.  It is worse with eating.  Neck pain is worse and asks me about botox.   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  She is accompanied by her friend, [name], who supplements the history.  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  She did see [name] since our last visit regarding dysphagia.  He did not think that this sounded like esophageal  dysphagia, despite the findings on the modified barium swallow which suggested such.  He suggested repeating the modified barium swallow.  This was done on October 01, 2018.  Her swallow study remained unchanged from prior.  Outpatient speech-language pathology was recommended and GI evaluation was again recommended.  Felt that she was a moderate aspiration risk.  Diagnosis was oropharyngeal phase dysphagia.  [name] also felt that we had not discussed whether or not she would want a feeding tube (we have discussed this, and at this point she had declined, but had not brought me copies of [place]).  We had also ordered a palliative care consult, which was done on October 9, but the consult remains  the uncompleted in the chart.  Had 2 falls when sliding out of the lift chair when trying to get up.  No other falls.  Paid caregivers are there from 7pm to 7am and then friend [name] is there 7am to 7pm.  States that bladder is still working.  Caregivers transfer her to the toilet.    08/10/19 update: Patient is seen today in follow-up for PSP.  Have not seen her since last December.  She is on carbidopa/levodopa 25/100, 2 tablets  3 times per day.  She was in the hospital at the very end of April for sepsis due to left gluteal cellulitis/perineal abscess, which required debridement.  She was just recently seen by the nurse practitioner at [facility] neurology for obstructive sleep apnea.  The patient had been off of her CPAP.  States that she is no longer walking.  States that she has caregivers 24 hours per day.  Swallowing is getting worse and "I choke on everything I eat."  She thinks that she has palliative care involved with her care.  They come to the house q 2 months.  ALLERGIES:   Allergies  Allergen Reactions   Aleve [Naproxen] Hives, Shortness Of Breath and Rash    Pt Avoids All Nsaids   Protonix [Pantoprazole Sodium] Other (See Comments)    Makes her feel wreidd   Zocor [Simvastatin] Other (See  Comments)    "weird feeling all over body"    CURRENT MEDICATIONS:  Outpatient Encounter Medications as of 08/10/2019  Medication Sig   acetaminophen (TYLENOL) 325 MG tablet Take 650 mg by mouth 2 (two) times daily.    aspirin 81 MG chewable tablet Chew 81 mg by mouth daily.   azelastine (ASTELIN) 0.1 % nasal spray Place 2 sprays into both nostrils 2 (two) times daily.    carbidopa-levodopa (SINEMET IR) 25-100 MG tablet TAKE 2 TABLETS BY MOUTH 3 TIMES A DAY   Cholecalciferol (VITAMIN D PO) Take 5,000 Units by mouth daily.   cyanocobalamin 500 MCG tablet Take 500 mcg by mouth daily.   diclofenac sodium (VOLTAREN) 1 % GEL Apply 4 g topically 2 (two) times daily.    escitalopram (LEXAPRO) 10 MG tablet Take 15 mg by mouth daily.    fenofibrate 160 MG tablet Take 160 mg by mouth every morning.    fluticasone (FLONASE) 50 MCG/ACT nasal spray Place 2 sprays into both nostrils daily.    loratadine (CLARITIN) 10 MG tablet Take 10 mg by mouth daily.    Melatonin 5 MG CAPS Take 10 mg by mouth at bedtime as needed.    Menthol, Topical Analgesic, (BIOFREEZE EX) Apply topically.   montelukast (SINGULAIR) 10 MG tablet Take 10 mg by mouth every evening.    mupirocin cream (BACTROBAN) 2 % Apply topically 2 (two) times daily.   nystatin cream (MYCOSTATIN) Apply 1 application topically 2 (two) times daily.    olmesartan (BENICAR) 40 MG tablet Take 40 mg by mouth daily.   traMADol (ULTRAM) 50 MG tablet Take 50 mg by mouth every 6 (six) hours as needed.   No facility-administered encounter medications on file as of 08/10/2019.     PAST MEDICAL HISTORY:   Past Medical History:  Diagnosis Date   Acute kidney failure (HCC)    hx of    Arthritis    fingers,right shoulder   Bradycardia    Bulge of cervical disc without myelopathy    C4 -- C7  and stenosis   CHF (congestive heart failure) (HCC)    acute diastolic ( congestive ) heart failure)    Chronic lumbar radiculopathy     L5- S1  right leg   Constipation    Depression    Diverticulosis large intestine w/o perforation or abscess w/bleeding    Dizziness    Dry eye syndrome of bilateral lacrimal glands    Frequency of urination    GERD (gastroesophageal reflux disease)    Hard of hearing    History of kidney stones 05/12/15   surgery  HTN (hypertension)    Hyperlipidemia    Hypokalemia    Morbid obesity due to excess calories ([place])    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[place])    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HCC)    Numbness and tingling in hands    OSA (obstructive sleep apnea)    moderate OSA per study 11-22-2007--  refused CPAP but used oxygen for 6 months at night,  states due to wt loss stopped using oxygen   Polyneuropathy, diabetic (HCC)    WALKS W/ CANE FOR BALANCE   Progressive supranuclear ophthalmoplegia (HCC)    Progressive supranuclear palsy (HCC)    Pyonephrosis    Pyonephrosis    Radiculopathy of lumbar region    Respiratory failure (HCC)    hx of acute respiratory failure wtih hypoxia    Right ureteral stone    Severe sepsis with septic shock (CODE) (HCC)    Shortness of breath dyspnea    Spinal stenosis, cervical region    SUI (stress urinary incontinence, female)    Tubulo-interstitial nephritis    Type 2 diabetes mellitus (HCC)    Type II - Diet controlled   Urinary incontinence    Urinary tract infection    Wears glasses    Wound of gluteal cleft    Left    PAST SURGICAL HISTORY:   Past Surgical History:  Procedure Laterality Date   ANTERIOR CERVICAL DECOMP/DISCECTOMY FUSION N/A 03/23/2016   Procedure: Cervical Four-Five, Cervical Five-Six, Cervical Six-Seven Anterior cervical decompression/diskectomy/fusion;  Surgeon: Leeroy Cha, MD;  Location: [facility];  Service: Neurosurgery;  Laterality: N/A;  C4-5 C5-6 C6-7 Anterior cervical decompression/diskectomy/fusion   CARDIOVASCULAR STRESS TEST  09-30-2007     normal Adenosine study/  no  ischemia /  normal LV function and wall motion , ef 82%   COLONOSCOPY     CYSTOSCOPY WITH RETROGRADE PYELOGRAM, URETEROSCOPY AND STENT PLACEMENT Right 05/12/2015   Procedure: CYSTOSCOPY WITH RETROGRADE PYELOGRAM, URETEROSCOPY,STONE EXTRACTION AND STENT PLACEMENT;  Surgeon: Franchot Gallo, MD;  Location: [facility];  Service: Urology;  Laterality: Right;   CYSTOSCOPY WITH RETROGRADE PYELOGRAM, URETEROSCOPY AND STENT PLACEMENT Right 04/25/2018   Procedure: CYSTOSCOPY WITH RETROGRADE PYELOGRAM, URETEROSCOPY AND STENT EXCHANGE;  Surgeon: Alexis Frock, MD;  Location: [facility];  Service: Urology;  Laterality: Right;   CYSTOSCOPY WITH STENT PLACEMENT Right 03/28/2018   Procedure: CYSTOSCOPY WITH STENT PLACEMENT retrograde pylegram;  Surgeon: Alexis Frock, MD;  Location: [facility];  Service: Urology;  Laterality: Right;   EXTRACORPOREAL SHOCK WAVE LITHOTRIPSY Right 08-18-2012   HOLMIUM LASER APPLICATION Right [id number]   Procedure: HOLMIUM LASER APPLICATION;  Surgeon: Franchot Gallo, MD;  Location: [facility];  Service: Urology;  Laterality: Right;   HOLMIUM LASER APPLICATION Right [id number]   Procedure: HOLMIUM LASER APPLICATION;  Surgeon: Alexis Frock, MD;  Location: [facility];  Service: Urology;  Laterality: Right;   ORIF HUMERUS FRACTURE Right 07/20/2016   Procedure: OPEN REDUCTION INTERNAL FIXATION (ORIF) PROXIMAL HUMERUS FRACTURE VS REVERSE TOTAL SHOULDER ARTHROPLASTY;  Surgeon: Netta Cedars, MD;  Location: [place];  Service: Orthopedics;  Laterality: Right;   SHOULDER ARTHROSCOPY Right 07/2016   TRANSTHORACIC ECHOCARDIOGRAM  09-23-2007   pseudonormal LV filling pattern,  ef 65-70%/  trivial AR/  mild LAE   TUBAL LIGATION  1985   WOUND DEBRIDEMENT Left 03/10/2019   Procedure: EXCISION AND DEBRIDEMENT LEFT PERINEAL WOUND;  Surgeon: Jovita Kussmaul, MD;  Location: [place];  Service: General;  Laterality: Left;    SOCIAL HISTORY:   Social  History    Socioeconomic History   Marital status: Widowed    Spouse name: Not on file   Number of children: 1   Years of education: College   Highest education level: Some college, no degree  Occupational History   Occupation: Therapist, sports: [place] resource strain: Not on file   Food insecurity    Worry: Not on file    Inability: Not on file   Transportation needs    Medical: Not on file    Non-medical: Not on file  Tobacco Use   Smoking status: Never Smoker   Smokeless tobacco: Never Used  Substance and Sexual Activity   Alcohol use: Yes    Alcohol/week: 0.0 standard drinks    Comment: 3- 4 times a year   Drug use: No   Sexual activity: Not on file  Lifestyle   Physical activity    Days per week: Not on file    Minutes per session: Not on file   Stress: Not on file  Relationships   Social connections    Talks on phone: Not on file    Gets together: Not on file    Attends religious service: Not on file    Active member of club or organization: Not on file    Attends meetings of clubs or organizations: Not on file    Relationship status: Not on file   Intimate partner violence    Fear of current or ex partner: Not on file    Emotionally abused: Not on file    Physically abused: Not on file    Forced sexual activity: Not on file  Other Topics Concern   Not on file  Social History Narrative   Patient lives at home with her dog name "Hot Rod".   Caffeine Use: 2-3 cups of coffee a day     FAMILY HISTORY:   Family Status  Relation Name Status   Father  Deceased at age 8       heart disease   Mother  Deceased at age 40       stroke   Sister  Alive       asthma   Sister  Deceased       breast cancer   Sister  Deceased       breast cancer   Other  (Not Specified)   Sister  (Not Specified)   Son  Alive    ROS:  Review of Systems  Constitutional: Positive for malaise/fatigue.  Eyes:  Positive for blurred vision.  Respiratory: Positive for cough.   Cardiovascular: Negative.   Gastrointestinal: Positive for heartburn (better with diet modification).  Skin: Negative.   Endo/Heme/Allergies: Negative.     PHYSICAL EXAMINATION:    VITALS:   Vitals:   08/10/19 1348  BP: 129/86  Pulse: (!) 101  SpO2: 99%  Weight: 178 lb 6.4 oz (80.9 kg)  Height: 5\' 1"  (1.549 m)     GEN:  The patient appears stated age and is in NAD. HEENT:  Normocephalic, atraumatic.   The mucous membranes are very dry. The superficial temporal arteries are without ropiness or tenderness.  No fasciculations in tongue. CV:  Tachy.  regular Lungs:  CTAB but has upper airway insp stridor Neck/HEME:  There are no carotid bruits bilaterally.  Patient with mild cervical dystonia.  Head left, side bent right  Neurological examination:  Orientation: The patient is alert and oriented x3.  Cranial nerves: There is good facial symmetry.  She has complete upgaze and downgaze paresis (same as last visit).  She has difficulty with smooth pursuit.  She has square wave jerks.   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 The visual fields are full to confrontational testing. The speech is fluent and significantly dysarthric.  Has pseudobulbar speech. Some trouble with the gutteral sounds.  Soft palate rises symmetrically and there is no tongue deviation. Hearing is intact to conversational tone. Sensation: Sensation is intact to light touch throughout Motor: Strength is 5/5 in the bilateral upper and lower extremities.   Shoulder shrug is equal and symmetric.  There is no pronator drift.    Movement examination: Tone: There is normal tone in the bilateral upper extremities.  The tone in the lower extremities is normal.  Abnormal movements: There is no tremor or dyskinesia today.  There is abnormal posture of the head with  right head tilt and the right ear approximates the right shoulder Coordination:  There is decremation with RAM's, seen with virtually all forms of RAMs on the right, especially toe taps.   Gait and Station: not tested.  WC bound  ASSESSMENT/PLAN:  1.  Progressive Supranuclear Palsy  -Unfortunately, her son has never come to her visits.  -Her daytime caregiver, [name], really does not seem to be that involved and stated today to our social worker that "she makes her own decisions."  -Congratulated her that she does have 24 hour/day care now.  -Continue carbidopa/levodopa 25/100, 2 tablets 3 times per day.   -tells me today that she has rescinded her DNR.  When I asked her what her goals of care were, she stated to "get back to walking."  I explained that this was not a realistic goal with PSP and that she really was in the end stages of this disease.  We discussed that this is a terminal disease.  She has palliative care coming to the home, but she does not know the group that is coming out to the home, nor does her friend [name].  -She has some eyelid opening apraxia.  I told her that Botox could help with this if it is particularly bothersome.  It is mostly the right eye. 2.  Cervical dystonia  -Had her first Botox on September 05, 2018 and states that "it only worked for a few days."  We decided to d/c that. 3.  Dysphagia and cough  -Modified barium swallow was completed on October 01, 2018.  There was evidence of oropharyngeal dysphagia.  Speech therapy was recommended and ordered by [name].  GI consult was recommended, and she had that done, but they did not feel that her dysphagia was an esophageal issue.  Suspect that her swallow has probably gotten much worse since then.  She does state that she has modified her diet.  -refer to Dr. Leory Plowman Icard to see if there is anything more that we can do.  She is a large aspiration risk. 4.  Mild B12 deficiency  -is on B12 supplement 1057mcg daily 5.   Constipation  -given rancho recipe at previous visits and is managing this well for now 6.  Pseudobulbar laughter  -discussed neudexta and she wishes to hold on this for now. 7.  Obstructive sleep apnea syndrome  -Following with [facility] neurology.  has been off for months  8.  Follow up is anticipated in the next 4-6 months, sooner should new neurologic issues arise.  Much greater than 50% of this visit was spent in counseling and coordinating care.  Total face to face time:  25 min, not including time pt spent with my social worker today

## 2019-08-10 ENCOUNTER — Ambulatory Visit (INDEPENDENT_AMBULATORY_CARE_PROVIDER_SITE_OTHER): Payer: PPO | Admitting: Neurology

## 2019-08-10 ENCOUNTER — Encounter: Payer: Self-pay | Admitting: Neurology

## 2019-08-10 ENCOUNTER — Ambulatory Visit (INDEPENDENT_AMBULATORY_CARE_PROVIDER_SITE_OTHER): Payer: PPO | Admitting: Clinical

## 2019-08-10 ENCOUNTER — Other Ambulatory Visit: Payer: Self-pay

## 2019-08-10 VITALS — BP 129/86 | HR 101 | Ht 61.0 in | Wt 178.4 lb

## 2019-08-10 DIAGNOSIS — Z7189 Other specified counseling: Secondary | ICD-10-CM | POA: Diagnosis not present

## 2019-08-10 DIAGNOSIS — G231 Progressive supranuclear ophthalmoplegia [Steele-Richardson-Olszewski]: Secondary | ICD-10-CM | POA: Diagnosis not present

## 2019-08-10 DIAGNOSIS — R482 Apraxia: Secondary | ICD-10-CM | POA: Diagnosis not present

## 2019-08-10 DIAGNOSIS — R05 Cough: Secondary | ICD-10-CM | POA: Diagnosis not present

## 2019-08-10 DIAGNOSIS — R059 Cough, unspecified: Secondary | ICD-10-CM

## 2019-08-10 NOTE — Patient Instructions (Addendum)
These are the things we talked about today: 1.  If the eyelid continues to close, we could consider botox.  Let us know if you would like to do that 2.  We will refer you to Dr. Valeta Harms, pulmonary 3.  I encourage you to meet with the support group for people with your disease

## 2019-08-13 DIAGNOSIS — E119 Type 2 diabetes mellitus without complications: Secondary | ICD-10-CM | POA: Diagnosis not present

## 2019-08-13 DIAGNOSIS — I10 Essential (primary) hypertension: Secondary | ICD-10-CM | POA: Diagnosis not present

## 2019-08-13 DIAGNOSIS — Z0000 Encounter for general adult medical examination without abnormal findings: Secondary | ICD-10-CM | POA: Diagnosis not present

## 2019-08-13 DIAGNOSIS — E559 Vitamin D deficiency, unspecified: Secondary | ICD-10-CM | POA: Diagnosis not present

## 2019-08-13 NOTE — BH Specialist Note (Signed)
Pt presents with advanced PSP. Pt exhibits marked difficulty with verbal communication given PSP sx.  LCSW presents for discussion regarding pt's recent withdrawal of DNR. Pt caregiver [name] brought into exam room to assist with communication. [name] is with pt throughout the daytime hours and a "professional caregiver through an agency" stay at night and so pt has 24/7 care, [name] unable to provide name of agency. [name] reports that nobody assists pt in making decisions or organizing for end-of-life care. Today, pt reports that she would like to think further re DNR order and other forms of advanced healthcare directives. LCSW invited pt & caregiver to schedule an appt with LCSW as desired.

## 2019-08-17 DIAGNOSIS — I10 Essential (primary) hypertension: Secondary | ICD-10-CM | POA: Diagnosis not present

## 2019-08-17 DIAGNOSIS — E1140 Type 2 diabetes mellitus with diabetic neuropathy, unspecified: Secondary | ICD-10-CM | POA: Diagnosis not present

## 2019-08-17 DIAGNOSIS — E559 Vitamin D deficiency, unspecified: Secondary | ICD-10-CM | POA: Diagnosis not present

## 2019-08-18 ENCOUNTER — Telehealth: Payer: Self-pay

## 2019-08-18 NOTE — Telephone Encounter (Signed)
-----   Message from Garner Nash, DO sent at 08/18/2019  8:24 AM EDT ----- Juluis Rainier, see below There is a referral in the system if you want to tell patrice Thanks Brad ----- Message ----- From: Ludwig Clarks, DO Sent: 08/10/2019   4:36 PM EDT To: Garner Nash, DO  Don't hate me for this!  This is an end stage PSP patient just waiting for aspiration.  She was DNR but has rescinded that and I hate that (for her).  I'm sending her to you b/c of cough (shock) and to see if there is anything else we can do.  Feel free to continue to bring up DNR/end of life talk with her.  She will end up in your ICU on vent if not, unfortunately.   Thanks, friend! rebecca

## 2019-08-18 NOTE — Telephone Encounter (Signed)
Call made to patient, spoke with friend Lenox Ponds (dpr). Consult appt made. Nothing further is needed at this time.

## 2019-08-27 ENCOUNTER — Encounter: Payer: Self-pay | Admitting: Pulmonary Disease

## 2019-08-27 ENCOUNTER — Ambulatory Visit (INDEPENDENT_AMBULATORY_CARE_PROVIDER_SITE_OTHER): Payer: PPO | Admitting: Pulmonary Disease

## 2019-08-27 ENCOUNTER — Other Ambulatory Visit: Payer: Self-pay

## 2019-08-27 VITALS — BP 122/86 | HR 100 | Ht 61.5 in | Wt 178.0 lb

## 2019-08-27 DIAGNOSIS — Z66 Do not resuscitate: Secondary | ICD-10-CM | POA: Diagnosis not present

## 2019-08-27 DIAGNOSIS — Z9189 Other specified personal risk factors, not elsewhere classified: Secondary | ICD-10-CM

## 2019-08-27 DIAGNOSIS — Z1159 Encounter for screening for other viral diseases: Secondary | ICD-10-CM | POA: Diagnosis not present

## 2019-08-27 DIAGNOSIS — G231 Progressive supranuclear ophthalmoplegia [Steele-Richardson-Olszewski]: Secondary | ICD-10-CM | POA: Diagnosis not present

## 2019-08-27 DIAGNOSIS — R05 Cough: Secondary | ICD-10-CM

## 2019-08-27 DIAGNOSIS — G243 Spasmodic torticollis: Secondary | ICD-10-CM

## 2019-08-27 DIAGNOSIS — R053 Chronic cough: Secondary | ICD-10-CM

## 2019-08-27 MED ORDER — FLUTTER DEVI: 0 refills | Status: AC

## 2019-08-27 NOTE — Patient Instructions (Addendum)
Thank you for visiting Dr. Valeta Harms at [facility]. Today we recommend the following:  Orders Placed This Encounter  Procedures  . Ambulatory Referral for DME   Meds ordered this encounter  Medications  . Respiratory Therapy Supplies (FLUTTER) DEVI    Sig: Use as directed    Dispense:  1 each    Refill:  0   DDNR form and [place] MOST FORM was completed today in the office   Return in about 6 months (around 02/25/2020).    Please do your part to reduce the spread of COVID-19.

## 2019-08-27 NOTE — Progress Notes (Signed)
Synopsis: Referred in October 2020 for cough by [name], MD  Subjective:   PATIENT ID: [name] [name] GENDER: female DOB: [birth year], MRN: [id number]  Chief Complaint  Patient presents with  . Follow-up    Last seen by Dr. Halford Chessman in 12/2017.    This is a 68 year old female that was seen by neurology in September 2020.  She was seen in the movement disorder clinic.  And has been followed there since 2017.  She was diagnosed with progressive supranuclear palsy.  She has significant cervical dystonia and recurrent dysphasia and cough.  Patient was referred to me to discuss her cough and significant aspiration risk related to her progressive supranuclear palsy.  Unfortunately the patient recently rescinded her DNR.  And there seems to be some confusion regarding prognosis of this disease.  OV 08/27/2019: Patient seen in the office today regarding her ongoing cough and choking abnormalities.  This is been a problem for the past several months.  Per the caregiver feels like she is stabilized to some.  She does have 24-hour care at home.  She is having very good support there.  We discussed today the likelihood of benefiting from mechanical support in the event she developed respiratory failure secondary to aspiration.  All of these questions were answered.  She understands that this is a terminal disease and will likely take her life.  However the neurologic disorder is not going to be the likely cause but a aspiration and respiratory failure event is the most common however she does want to stay positive and try to do the best she can for airway clearance.     Past Medical History:  Diagnosis Date  . Acute kidney failure (HCC)    hx of   . Arthritis    fingers,right shoulder  . Bradycardia   . 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 CHF (congestive heart failure) (HCC)    acute diastolic ( congestive ) heart failure)   . Chronic lumbar radiculopathy     L5- S1  right leg  . Constipation   . Depression   . Diverticulosis large intestine w/o perforation or abscess w/bleeding   . Dizziness   . Dry eye syndrome of bilateral lacrimal glands   . Frequency of urination   . GERD (gastroesophageal reflux disease)   . Hard of hearing   . History of kidney stones 05/12/15   surgery   . HTN (hypertension)   . Hyperlipidemia   . Hypokalemia   . Morbid obesity due to excess calories ([place])   . 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 Numbness and tingling in hands   . OSA (obstructive sleep apnea)    moderate OSA per study 11-22-2007--  refused CPAP but used oxygen for 6 months at night,  states due to wt loss stopped using oxygen  . Polyneuropathy, diabetic ([place])    WALKS W/ CANE FOR BALANCE  . 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 Progressive supranuclear palsy ([place])   . Pyonephrosis   . Pyonephrosis   . Radiculopathy of lumbar region   . Respiratory failure (HCC)    hx of acute respiratory failure wtih hypoxia   . Right ureteral stone   . Severe sepsis with septic shock (CODE) ([place])   . Shortness of breath dyspnea   . Spinal stenosis, cervical region   . SUI (stress urinary  incontinence, female)   . Tubulo-interstitial nephritis   . Type 2 diabetes mellitus (HCC)    Type II - Diet controlled  . Urinary incontinence   . Urinary tract infection   . Wears glasses   . Wound of gluteal cleft    Left     Family History  Problem Relation Age of Onset  . Heart Problems Father   . Stroke Mother   . Hypertension Other   . Stroke Other   . Breast cancer Sister   . Healthy Son      Past Surgical History:  Procedure Laterality Date  . ANTERIOR CERVICAL DECOMP/DISCECTOMY FUSION N/A 03/23/2016   Procedure: Cervical Four-Five, Cervical Five-Six, Cervical Six-Seven Anterior cervical decompression/diskectomy/fusion;  Surgeon: Leeroy Cha, MD;  Location: [facility];  Service: Neurosurgery;   Laterality: N/A;  C4-5 C5-6 C6-7 Anterior cervical decompression/diskectomy/fusion  . CARDIOVASCULAR STRESS TEST  09-30-2007     normal Adenosine study/  no ischemia /  normal LV function and wall motion , ef 82%  . COLONOSCOPY    . CYSTOSCOPY WITH RETROGRADE PYELOGRAM, URETEROSCOPY AND STENT PLACEMENT Right 05/12/2015   Procedure: CYSTOSCOPY WITH RETROGRADE PYELOGRAM, URETEROSCOPY,STONE EXTRACTION AND STENT PLACEMENT;  Surgeon: Franchot Gallo, MD;  Location: [facility];  Service: Urology;  Laterality: Right;  . CYSTOSCOPY WITH RETROGRADE PYELOGRAM, URETEROSCOPY AND STENT PLACEMENT Right 04/25/2018   Procedure: CYSTOSCOPY WITH RETROGRADE PYELOGRAM, URETEROSCOPY AND STENT EXCHANGE;  Surgeon: Alexis Frock, MD;  Location: [facility];  Service: Urology;  Laterality: Right;  . CYSTOSCOPY WITH STENT PLACEMENT Right 03/28/2018   Procedure: CYSTOSCOPY WITH STENT PLACEMENT retrograde pylegram;  Surgeon: Alexis Frock, MD;  Location: [facility];  Service: Urology;  Laterality: Right;  . EXTRACORPOREAL SHOCK WAVE LITHOTRIPSY Right 08-18-2012  . HOLMIUM LASER APPLICATION Right [id number]   Procedure: HOLMIUM LASER APPLICATION;  Surgeon: Franchot Gallo, MD;  Location: [facility];  Service: Urology;  Laterality: Right;  . HOLMIUM LASER APPLICATION Right [id number]   Procedure: HOLMIUM LASER APPLICATION;  Surgeon: Alexis Frock, MD;  Location: [facility];  Service: Urology;  Laterality: Right;  . ORIF HUMERUS FRACTURE Right 07/20/2016   Procedure: OPEN REDUCTION INTERNAL FIXATION (ORIF) PROXIMAL HUMERUS FRACTURE VS REVERSE TOTAL SHOULDER ARTHROPLASTY;  Surgeon: Netta Cedars, MD;  Location: [facility];  Service: Orthopedics;  Laterality: Right;  . SHOULDER ARTHROSCOPY Right 07/2016  . TRANSTHORACIC ECHOCARDIOGRAM  09-23-2007   pseudonormal LV filling pattern,  ef 65-70%/  trivial AR/  mild LAE  . TUBAL LIGATION  1985  . WOUND DEBRIDEMENT Left 03/10/2019   Procedure: EXCISION AND  DEBRIDEMENT LEFT PERINEAL WOUND;  Surgeon: Jovita Kussmaul, MD;  Location: [facility];  Service: General;  Laterality: Left;    Social History   Socioeconomic History  . Marital status: Widowed    Spouse name: Not on file  . Number of children: 1  . Years of education: College  . Highest education level: Some college, no degree  Occupational History  . Occupation: Therapist, sports: [employer]  . Financial resource strain: Not on file  . Food insecurity    Worry: Not on file    Inability: Not on file  . Transportation needs    Medical: Not on file    Non-medical: Not on file  Tobacco Use  . Smoking status: Never Smoker  . Smokeless tobacco: Never Used  Substance and Sexual Activity  . Alcohol use: Yes    Alcohol/week: 0.0 standard drinks  Comment: 3- 4 times a year  . Drug use: No  . Sexual activity: Not on file  Lifestyle  . Physical activity    Days per week: Not on file    Minutes per session: Not on file  . Stress: Not on file  Relationships  . Social Herbalist on phone: Not on file    Gets together: Not on file    Attends religious service: Not on file    Active member of club or organization: Not on file    Attends meetings of clubs or organizations: Not on file    Relationship status: Not on file  . Intimate partner violence    Fear of current or ex partner: Not on file    Emotionally abused: Not on file    Physically abused: Not on file    Forced sexual activity: Not on file  Other Topics Concern  . Not on file  Social History Narrative   Patient lives at home with her dog name "Hot Rod".   Caffeine Use: 2-3 cups of coffee a day      Allergies  Allergen Reactions  . Aleve [Naproxen] Hives, Shortness Of Breath and Rash    Pt Avoids All Nsaids  . Protonix [Pantoprazole Sodium] Other (See Comments)    Makes her feel wreidd  . Zocor [Simvastatin] Other (See Comments)    "weird feeling all over body"      Outpatient Medications Prior to Visit  Medication Sig Dispense Refill  . acetaminophen (TYLENOL) 325 MG tablet Take 650 mg by mouth 2 (two) times daily.     Marland Kitchen aspirin 81 MG chewable tablet Chew 81 mg by mouth daily.    Marland Kitchen azelastine (ASTELIN) 0.1 % nasal spray Place 2 sprays into both nostrils 2 (two) times daily.   11  . carbidopa-levodopa (SINEMET IR) 25-100 MG tablet TAKE 2 TABLETS BY MOUTH 3 TIMES A DAY 540 tablet 1  . Cholecalciferol (VITAMIN D PO) Take 5,000 Units by mouth daily.    . cyanocobalamin 500 MCG tablet Take 500 mcg by mouth daily.    . diclofenac sodium (VOLTAREN) 1 % GEL Apply 4 g topically 2 (two) times daily.     Marland Kitchen escitalopram (LEXAPRO) 10 MG tablet Take 15 mg by mouth daily.   2  . fenofibrate 160 MG tablet Take 160 mg by mouth every morning.     . fluticasone (FLONASE) 50 MCG/ACT nasal spray Place 2 sprays into both nostrils daily.   11  . loratadine (CLARITIN) 10 MG tablet Take 10 mg by mouth daily.     . Melatonin 5 MG CAPS Take 10 mg by mouth at bedtime as needed.     . Menthol, Topical Analgesic, (BIOFREEZE EX) Apply topically.    . montelukast (SINGULAIR) 10 MG tablet Take 10 mg by mouth every evening.     . mupirocin cream (BACTROBAN) 2 % Apply topically 2 (two) times daily. 15 g 0  . nystatin cream (MYCOSTATIN) Apply 1 application topically 2 (two) times daily.     Marland Kitchen olmesartan (BENICAR) 40 MG tablet Take 40 mg by mouth daily.    . traMADol (ULTRAM) 50 MG tablet Take 50 mg by mouth every 6 (six) hours as needed.     No facility-administered medications prior to visit.     Review of Systems  Constitutional: Positive for malaise/fatigue. Negative for chills, fever and weight loss.  HENT: Negative for hearing loss, sore throat and tinnitus.  Eyes: Negative for blurred vision and double vision.  Respiratory: Positive for cough and sputum production. Negative for hemoptysis, shortness of breath, wheezing and stridor.   Cardiovascular: Negative for chest pain,  palpitations, orthopnea, leg swelling and PND.  Gastrointestinal: Negative for abdominal pain, constipation, diarrhea, heartburn, nausea and vomiting.  Genitourinary: Negative for dysuria, hematuria and urgency.  Musculoskeletal: Negative for joint pain and myalgias.  Skin: Negative for itching and rash.  Neurological: Negative for dizziness, tingling, weakness and headaches.  Endo/Heme/Allergies: Negative for environmental allergies. Does not bruise/bleed easily.  Psychiatric/Behavioral: Negative for depression. The patient is not nervous/anxious and does not have insomnia.   All other systems reviewed and are negative.   Objective:  Physical Exam Vitals signs reviewed.  Constitutional:      General: She is not in acute distress.    Appearance: She is well-developed.  HENT:     Head: Normocephalic and atraumatic.  Eyes:     General: No scleral icterus.    Conjunctiva/sclera: Conjunctivae normal.     Pupils: Pupils are equal, round, and reactive to light.  Neck:     Musculoskeletal: Neck supple.     Vascular: No JVD.     Trachea: No tracheal deviation.  Cardiovascular:     Rate and Rhythm: Normal rate and regular rhythm.     Heart sounds: Normal heart sounds. No murmur.  Pulmonary:     Effort: Pulmonary effort is normal. No tachypnea, accessory muscle usage or respiratory distress.     Breath sounds: Normal breath sounds. No stridor. No wheezing, rhonchi or rales.  Abdominal:     General: Bowel sounds are normal. There is no distension.     Palpations: Abdomen is soft.     Tenderness: There is no abdominal tenderness.  Musculoskeletal:        General: No tenderness.  Lymphadenopathy:     Cervical: No cervical adenopathy.  Skin:    General: Skin is warm and dry.     Capillary Refill: Capillary refill takes less than 2 seconds.     Findings: No rash.  Neurological:     Mental Status: She is alert and oriented to person, place, and time.     Comments: Upper neck and ext,  movements, spontaneous   Psychiatric:        Behavior: Behavior normal.      Vitals:   08/27/19 1346  BP: 122/86  Pulse: 100  SpO2: 97%  Weight: 178 lb (80.7 kg)  Height: 5' 1.5" (1.562 m)   97% on RA BMI Readings from Last 3 Encounters:  08/27/19 33.09 kg/m  08/10/19 33.71 kg/m  07/14/19 34.32 kg/m   Wt Readings from Last 3 Encounters:  08/27/19 178 lb (80.7 kg)  08/10/19 178 lb 6.4 oz (80.9 kg)  07/14/19 184 lb 9.6 oz (83.7 kg)     CBC    Component Value Date/Time   WBC 10.4 03/11/2019 0306   RBC 3.37 (L) 03/11/2019 0306   HGB 10.0 (L) 03/11/2019 0306   HCT 30.5 (L) 03/11/2019 0306   PLT 430 (H) 03/11/2019 0306   MCV 90.5 03/11/2019 0306   MCH 29.7 03/11/2019 0306   MCHC 32.8 03/11/2019 0306   RDW 13.2 03/11/2019 0306   LYMPHSABS 0.8 03/06/2019 0039   MONOABS 1.0 03/06/2019 0039   EOSABS 0.0 03/06/2019 0039   BASOSABS 0.0 03/06/2019 0039    Chest Imaging: Chest x-ray 03/05/2019: No active cardiopulmonary disease. The patient's images have been independently reviewed by me.  Pulmonary Functions Testing Results: PFT Results Latest Ref Rng & Units 11/15/2017  FVC-Pre L 2.79  FVC-Predicted Pre % 101  FVC-Post L 2.81  FVC-Predicted Post % 101  Pre FEV1/FVC % % 82  Post FEV1/FCV % % 84  FEV1-Pre L 2.28  FEV1-Predicted Pre % 108  FEV1-Post L 2.38  DLCO UNC% % 122  DLCO COR %Predicted % 127    FeNO: None   Pathology: None   Echocardiogram:  Study Conclusions  - Left ventricle: The cavity size was normal. There was mild   concentric hypertrophy. Systolic function was normal. The   estimated ejection fraction was in the range of 55% to 60%. Wall   motion was normal; there were no regional wall motion   abnormalities. Features are consistent with a pseudonormal left   ventricular filling pattern, with concomitant abnormal relaxation   and increased filling pressure (grade 2 diastolic dysfunction).   Doppler parameters are consistent with  elevated ventricular   end-diastolic filling pressure. - Aortic valve: There was mild regurgitation. - Mitral valve: There was mild regurgitation. - Left atrium: The atrium was severely dilated. - Right ventricle: The cavity size was moderately dilated. Wall   thickness was normal. Systolic function was mildly reduced. - Right atrium: The atrium was moderately dilated. - Tricuspid valve: There was moderate regurgitation. - Pulmonary arteries: Systolic pressure was mildly to moderately   increased. PA peak pressure: 40 mm Hg (S). - Pericardium, extracardiac: There was a left pleural effusion.   Heart Catheterization: None     Assessment & Plan:     ICD-10-CM   1. Chronic cough  R05 Ambulatory Referral for DME    Ambulatory Referral for DME  2. PSP (progressive supranuclear palsy) ([place])  G23.1 Ambulatory Referral for DME    Ambulatory Referral for DME  3. Cervical dystonia  G24.3 Ambulatory Referral for DME  4. At high risk for aspiration  Z91.89 Ambulatory Referral for DME    Ambulatory Referral for DME  5. DNR (do not resuscitate)  Z66     Discussion:  68 year old female presents to the office with chronic cough.  This is secondary to recurrent aspiration into the airway.  She has progressive supranuclear palsy with cervical dystonia.  This has been well covered and currently under management by neurology.  Today in the office we discussed her high risk of aspiration and the likelihood of death related to respiratory failure from her progressive neurologic disease.  Due to this we also discussed various airway clearance techniques to help keep her airway as clear as possible.  I have placed DME orders for a CoughAssist device to help with airway clearance.  Also we have given her a flutter valve today in the office and trained her in the caregiver on how to use this.  In the office today we also spent greater than 18 minutes of time discussing advanced care planning.  I  explained that as a intensivist and ICU doctor I did not feel that it would be appropriate for her to ever be placed on mechanical life support in the setting of respiratory failure.  She states that they have discussed this before in the neurology office.  I believe that she has a better understanding of what this entails at this time.  We discussed the importance of having a durable DNR form signed and active as well as filling out of the [place] most form.  Today in the office we signed a durable DNR form and  a [place] MOST form.  The patient has a hospice evaluation from outpatient palliative care plan for tomorrow.  I think this is definitely appropriate and needs to keep this appointment with palliative.  Greater than 50% of this patient's 45-minute visit was spent face-to-face discussing above recommendations.  This time is separate of the advance care planning time that was spent.  We went over all of the risk benefits and alternatives of proceeding with advanced mechanical life support in the setting of respiratory failure and what that would entail in a patient with progressive supranuclear palsy and the understanding that she currently has a irreversible terminal illness that she is likely going to suffer from respiratory failure in the future.    Current Outpatient Medications:  .  acetaminophen (TYLENOL) 325 MG tablet, Take 650 mg by mouth 2 (two) times daily. , Disp: , Rfl:  .  aspirin 81 MG chewable tablet, Chew 81 mg by mouth daily., Disp: , Rfl:  .  azelastine (ASTELIN) 0.1 % nasal spray, Place 2 sprays into both nostrils 2 (two) times daily. , Disp: , Rfl: 11 .  carbidopa-levodopa (SINEMET IR) 25-100 MG tablet, TAKE 2 TABLETS BY MOUTH 3 TIMES A DAY, Disp: 540 tablet, Rfl: 1 .  Cholecalciferol (VITAMIN D PO), Take 5,000 Units by mouth daily., Disp: , Rfl:  .  cyanocobalamin 500 MCG tablet, Take 500 mcg by mouth daily., Disp: , Rfl:  .  diclofenac sodium (VOLTAREN) 1 % GEL, Apply  4 g topically 2 (two) times daily. , Disp: , Rfl:  .  escitalopram (LEXAPRO) 10 MG tablet, Take 15 mg by mouth daily. , Disp: , Rfl: 2 .  fenofibrate 160 MG tablet, Take 160 mg by mouth every morning. , Disp: , Rfl:  .  fluticasone (FLONASE) 50 MCG/ACT nasal spray, Place 2 sprays into both nostrils daily. , Disp: , Rfl: 11 .  loratadine (CLARITIN) 10 MG tablet, Take 10 mg by mouth daily. , Disp: , Rfl:  .  Melatonin 5 MG CAPS, Take 10 mg by mouth at bedtime as needed. , Disp: , Rfl:  .  Menthol, Topical Analgesic, (BIOFREEZE EX), Apply topically., Disp: , Rfl:  .  montelukast (SINGULAIR) 10 MG tablet, Take 10 mg by mouth every evening. , Disp: , Rfl:  .  mupirocin cream (BACTROBAN) 2 %, Apply topically 2 (two) times daily., Disp: 15 g, Rfl: 0 .  nystatin cream (MYCOSTATIN), Apply 1 application topically 2 (two) times daily. , Disp: , Rfl:  .  olmesartan (BENICAR) 40 MG tablet, Take 40 mg by mouth daily., Disp: , Rfl:  .  traMADol (ULTRAM) 50 MG tablet, Take 50 mg by mouth every 6 (six) hours as needed., Disp: , Rfl:  .  Respiratory Therapy Supplies (FLUTTER) DEVI, Use as directed, Disp: 1 each, Rfl: 0   Garner Nash, DO [facility] 08/27/2019 2:29 PM

## 2019-08-28 ENCOUNTER — Other Ambulatory Visit: Payer: PPO | Admitting: Internal Medicine

## 2019-08-31 ENCOUNTER — Other Ambulatory Visit: Payer: Self-pay

## 2019-09-01 DIAGNOSIS — M170 Bilateral primary osteoarthritis of knee: Secondary | ICD-10-CM | POA: Diagnosis not present

## 2019-09-03 DIAGNOSIS — S2020XA Contusion of thorax, unspecified, initial encounter: Secondary | ICD-10-CM | POA: Diagnosis not present

## 2019-09-03 DIAGNOSIS — Z9181 History of falling: Secondary | ICD-10-CM | POA: Diagnosis not present

## 2019-09-08 DIAGNOSIS — Z9181 History of falling: Secondary | ICD-10-CM | POA: Diagnosis not present

## 2019-09-08 DIAGNOSIS — M170 Bilateral primary osteoarthritis of knee: Secondary | ICD-10-CM | POA: Diagnosis not present

## 2019-09-08 DIAGNOSIS — S20212D Contusion of left front wall of thorax, subsequent encounter: Secondary | ICD-10-CM | POA: Diagnosis not present

## 2019-09-09 ENCOUNTER — Ambulatory Visit
Admission: RE | Admit: 2019-09-09 | Discharge: 2019-09-09 | Disposition: A | Discharge status: Home / Self Care | Payer: PPO | Source: Ambulatory Visit | Attending: Family Medicine | Admitting: Family Medicine

## 2019-09-09 ENCOUNTER — Other Ambulatory Visit: Payer: Self-pay | Admitting: Family Medicine

## 2019-09-09 ENCOUNTER — Other Ambulatory Visit: Payer: Self-pay

## 2019-09-09 DIAGNOSIS — R079 Chest pain, unspecified: Secondary | ICD-10-CM

## 2019-09-09 DIAGNOSIS — S46919S Strain of unspecified muscle, fascia and tendon at shoulder and upper arm level, unspecified arm, sequela: Secondary | ICD-10-CM | POA: Diagnosis not present

## 2019-09-09 DIAGNOSIS — R0781 Pleurodynia: Secondary | ICD-10-CM | POA: Diagnosis not present

## 2019-09-09 DIAGNOSIS — G1220 Motor neuron disease, unspecified: Secondary | ICD-10-CM | POA: Diagnosis not present

## 2019-09-09 DIAGNOSIS — Z9181 History of falling: Secondary | ICD-10-CM | POA: Diagnosis not present

## 2019-09-10 ENCOUNTER — Other Ambulatory Visit: Payer: Self-pay

## 2019-09-10 DIAGNOSIS — R053 Chronic cough: Secondary | ICD-10-CM

## 2019-09-10 DIAGNOSIS — T17908A Unspecified foreign body in respiratory tract, part unspecified causing other injury, initial encounter: Secondary | ICD-10-CM

## 2019-09-10 DIAGNOSIS — R05 Cough: Secondary | ICD-10-CM

## 2019-09-16 DIAGNOSIS — Z9181 History of falling: Secondary | ICD-10-CM | POA: Diagnosis not present

## 2019-09-16 DIAGNOSIS — M170 Bilateral primary osteoarthritis of knee: Secondary | ICD-10-CM | POA: Diagnosis not present

## 2019-09-16 DIAGNOSIS — I10 Essential (primary) hypertension: Secondary | ICD-10-CM | POA: Diagnosis not present

## 2019-09-16 DIAGNOSIS — G1220 Motor neuron disease, unspecified: Secondary | ICD-10-CM | POA: Diagnosis not present

## 2019-09-21 DIAGNOSIS — G231 Progressive supranuclear ophthalmoplegia [Steele-Richardson-Olszewski]: Secondary | ICD-10-CM | POA: Diagnosis not present

## 2019-10-19 ENCOUNTER — Other Ambulatory Visit: Payer: Self-pay | Admitting: Neurology

## 2019-10-21 DIAGNOSIS — G4700 Insomnia, unspecified: Secondary | ICD-10-CM | POA: Diagnosis not present

## 2019-10-21 DIAGNOSIS — G231 Progressive supranuclear ophthalmoplegia [Steele-Richardson-Olszewski]: Secondary | ICD-10-CM | POA: Diagnosis not present

## 2019-10-21 DIAGNOSIS — Z9181 History of falling: Secondary | ICD-10-CM | POA: Diagnosis not present

## 2019-10-21 DIAGNOSIS — G1220 Motor neuron disease, unspecified: Secondary | ICD-10-CM | POA: Diagnosis not present

## 2019-11-11 DIAGNOSIS — G4700 Insomnia, unspecified: Secondary | ICD-10-CM | POA: Diagnosis not present

## 2019-11-11 DIAGNOSIS — M170 Bilateral primary osteoarthritis of knee: Secondary | ICD-10-CM | POA: Diagnosis not present

## 2019-11-11 DIAGNOSIS — G1220 Motor neuron disease, unspecified: Secondary | ICD-10-CM | POA: Diagnosis not present

## 2019-11-21 DIAGNOSIS — G231 Progressive supranuclear ophthalmoplegia [Steele-Richardson-Olszewski]: Secondary | ICD-10-CM | POA: Diagnosis not present

## 2019-12-17 DIAGNOSIS — G4700 Insomnia, unspecified: Secondary | ICD-10-CM | POA: Diagnosis not present

## 2019-12-17 DIAGNOSIS — M170 Bilateral primary osteoarthritis of knee: Secondary | ICD-10-CM | POA: Diagnosis not present

## 2019-12-17 DIAGNOSIS — F320 Major depressive disorder, single episode, mild: Secondary | ICD-10-CM | POA: Diagnosis not present

## 2019-12-17 DIAGNOSIS — I10 Essential (primary) hypertension: Secondary | ICD-10-CM | POA: Diagnosis not present

## 2019-12-22 DIAGNOSIS — G231 Progressive supranuclear ophthalmoplegia [Steele-Richardson-Olszewski]: Secondary | ICD-10-CM | POA: Diagnosis not present

## 2019-12-30 DIAGNOSIS — Z1231 Encounter for screening mammogram for malignant neoplasm of breast: Secondary | ICD-10-CM | POA: Diagnosis not present

## 2019-12-30 DIAGNOSIS — Z124 Encounter for screening for malignant neoplasm of cervix: Secondary | ICD-10-CM | POA: Diagnosis not present

## 2020-01-11 ENCOUNTER — Encounter: Payer: Self-pay | Admitting: Adult Health

## 2020-01-12 ENCOUNTER — Telehealth: Payer: Self-pay

## 2020-01-12 ENCOUNTER — Encounter: Payer: Self-pay | Admitting: Adult Health

## 2020-01-12 ENCOUNTER — Ambulatory Visit: Payer: PPO | Admitting: Adult Health

## 2020-01-12 ENCOUNTER — Other Ambulatory Visit: Payer: Self-pay

## 2020-01-12 VITALS — BP 143/67 | HR 85 | Temp 97.7°F | Ht 61.5 in

## 2020-01-12 DIAGNOSIS — Z9989 Dependence on other enabling machines and devices: Secondary | ICD-10-CM

## 2020-01-12 DIAGNOSIS — G4733 Obstructive sleep apnea (adult) (pediatric): Secondary | ICD-10-CM | POA: Diagnosis not present

## 2020-01-12 NOTE — Telephone Encounter (Signed)
[facility] confirmation via community message.

## 2020-01-12 NOTE — Telephone Encounter (Signed)
Sent a order to [facility] per NP Megans request.  Awaiting response

## 2020-01-12 NOTE — Progress Notes (Signed)
PATIENT: [name] [name] DOB: 1951-05-09  REASON FOR VISIT: follow up HISTORY FROM: patient  HISTORY OF PRESENT ILLNESS: Today 01/12/20:  [name] [name] is a 69 year old female with a history of obstructive sleep apnea on CPAP.  She returns today for follow-up.  Her download indicates that she uses her machine 30 out of 30 days for compliance of 100%.  She did use her machine greater than 4 hours a night.  On average she uses her machine 1 hour and 26 minutes.  Her residual AHI is 20.6 on 4 cm of water with EPR 3.  With leak at 33.5 L/min.  HISTORY 07/14/19:  [name] [name] is a 69 year old female with a history of obstructive sleep apnea on CPAP.  She returns today for follow-up.  She states that she has not restarted using her CPAP.  She states that when they have checked her oxygen it was 98% so she did not think she needed the CPAP any longer.  I explained the purpose of the CPAP and the risk associated with untreated sleep apnea.  She voiced understanding and states that she plans to restart this.   REVIEW OF SYSTEMS: Out of a complete 14 system review of symptoms, the patient complains only of the following symptoms, and all other reviewed systems are negative.  ALLERGIES: Allergies  Allergen Reactions  . Aleve [Naproxen] Hives, Shortness Of Breath and Rash    Pt Avoids All Nsaids  . Protonix [Pantoprazole Sodium] Other (See Comments)    Makes her feel wreidd  . Zocor [Simvastatin] Other (See Comments)    "weird feeling all over body"    HOME MEDICATIONS: Outpatient Medications Prior to Visit  Medication Sig Dispense Refill  . acetaminophen (TYLENOL) 325 MG tablet Take 650 mg by mouth 2 (two) times daily.     Marland Kitchen aspirin 81 MG chewable tablet Chew 81 mg by mouth daily.    Marland Kitchen azelastine (ASTELIN) 0.1 % nasal spray Place 2 sprays into both nostrils 2 (two) times daily.   11  . carbidopa-levodopa (SINEMET IR) 25-100 MG tablet TAKE 2 TABLETS BY MOUTH 3 TIMES A DAY 540 tablet 1  .  Cholecalciferol (VITAMIN D PO) Take 5,000 Units by mouth daily.    . cyanocobalamin 500 MCG tablet Take 500 mcg by mouth daily.    . diclofenac sodium (VOLTAREN) 1 % GEL Apply 4 g topically 2 (two) times daily.     Marland Kitchen escitalopram (LEXAPRO) 10 MG tablet Take 15 mg by mouth daily.   2  . fenofibrate 160 MG tablet Take 160 mg by mouth every morning.     . fluticasone (FLONASE) 50 MCG/ACT nasal spray Place 2 sprays into both nostrils daily.   11  . gabapentin (NEURONTIN) 100 MG capsule Take 300 mg by mouth at bedtime.    Marland Kitchen loratadine (CLARITIN) 10 MG tablet Take 10 mg by mouth daily.     Marland Kitchen losartan-hydrochlorothiazide (HYZAAR) 100-12.5 MG tablet Take 1 tablet by mouth daily.    . Melatonin 5 MG CAPS Take 10 mg by mouth at bedtime as needed.     . Menthol, Topical Analgesic, (BIOFREEZE EX) Apply topically.    . montelukast (SINGULAIR) 10 MG tablet Take 10 mg by mouth every evening.     . mupirocin cream (BACTROBAN) 2 % Apply topically 2 (two) times daily. 15 g 0  . nystatin cream (MYCOSTATIN) Apply 1 application topically 2 (two) times daily.     Marland Kitchen Respiratory Therapy Supplies (FLUTTER) DEVI Use as  directed 1 each 0  . traMADol (ULTRAM) 50 MG tablet Take 50 mg by mouth every 6 (six) hours as needed.    Marland Kitchen olmesartan (BENICAR) 40 MG tablet Take 40 mg by mouth daily.     No facility-administered medications prior to visit.    PAST MEDICAL HISTORY: Past Medical History:  Diagnosis Date  . Acute kidney failure (HCC)    hx of   . Arthritis    fingers,right shoulder  . Bradycardia   . 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 CHF (congestive heart failure) (HCC)    acute diastolic ( congestive ) heart failure)   . Chronic lumbar radiculopathy    L5- S1  right leg  . Constipation   . Depression   . Diverticulosis large intestine w/o perforation or abscess w/bleeding   . Dizziness   . Dry eye syndrome of bilateral lacrimal glands   . Frequency of urination   .  GERD (gastroesophageal reflux disease)   . Hard of hearing   . History of kidney stones 05/12/15   surgery   . HTN (hypertension)   . Hyperlipidemia   . Hypokalemia   . Morbid obesity due to excess calories ([place])   . 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 Numbness and tingling in hands   . OSA (obstructive sleep apnea)    moderate OSA per study 11-22-2007--  refused CPAP but used oxygen for 6 months at night,  states due to wt loss stopped using oxygen  . Polyneuropathy, diabetic ([place])    WALKS W/ CANE FOR BALANCE  . 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 Pyonephrosis   . Pyonephrosis   . Radiculopathy of lumbar region   . Respiratory failure (HCC)    hx of acute respiratory failure wtih hypoxia   . Right ureteral stone   . Severe sepsis with septic shock (CODE) ([place])   . Shortness of breath dyspnea   . Spinal stenosis, cervical region   . SUI (stress urinary incontinence, female)   . Tubulo-interstitial nephritis   . Type 2 diabetes mellitus (HCC)    Type II - Diet controlled  . Urinary incontinence   . Urinary tract infection   . Wears glasses   . Wound of gluteal cleft    Left    PAST SURGICAL HISTORY: Past Surgical History:  Procedure Laterality Date  . ANTERIOR CERVICAL DECOMP/DISCECTOMY FUSION N/A 03/23/2016   Procedure: Cervical Four-Five, Cervical Five-Six, Cervical Six-Seven Anterior cervical decompression/diskectomy/fusion;  Surgeon: Leeroy Cha, MD;  Location: [facility];  Service: Neurosurgery;  Laterality: N/A;  C4-5 C5-6 C6-7 Anterior cervical decompression/diskectomy/fusion  . CARDIOVASCULAR STRESS TEST  09-30-2007     normal Adenosine study/  no ischemia /  normal LV function and wall motion , ef 82%  . COLONOSCOPY    . CYSTOSCOPY WITH RETROGRADE PYELOGRAM, URETEROSCOPY AND STENT PLACEMENT Right 05/12/2015   Procedure: CYSTOSCOPY WITH RETROGRADE PYELOGRAM,  URETEROSCOPY,STONE EXTRACTION AND STENT PLACEMENT;  Surgeon: Franchot Gallo, MD;  Location: [facility];  Service: Urology;  Laterality: Right;  . CYSTOSCOPY WITH RETROGRADE PYELOGRAM, URETEROSCOPY AND STENT PLACEMENT Right 04/25/2018   Procedure: CYSTOSCOPY WITH RETROGRADE PYELOGRAM, URETEROSCOPY AND STENT EXCHANGE;  Surgeon: Alexis Frock, MD;  Location: [facility];  Service: Urology;  Laterality: Right;  . CYSTOSCOPY WITH STENT PLACEMENT Right 03/28/2018   Procedure: CYSTOSCOPY WITH  STENT PLACEMENT retrograde pylegram;  Surgeon: Alexis Frock, MD;  Location: [facility];  Service: Urology;  Laterality: Right;  . EXTRACORPOREAL SHOCK WAVE LITHOTRIPSY Right 08-18-2012  . HOLMIUM LASER APPLICATION Right [id number]   Procedure: HOLMIUM LASER APPLICATION;  Surgeon: Franchot Gallo, MD;  Location: [facility];  Service: Urology;  Laterality: Right;  . HOLMIUM LASER APPLICATION Right [id number]   Procedure: HOLMIUM LASER APPLICATION;  Surgeon: Alexis Frock, MD;  Location: [facility];  Service: Urology;  Laterality: Right;  . ORIF HUMERUS FRACTURE Right 07/20/2016   Procedure: OPEN REDUCTION INTERNAL FIXATION (ORIF) PROXIMAL HUMERUS FRACTURE VS REVERSE TOTAL SHOULDER ARTHROPLASTY;  Surgeon: Netta Cedars, MD;  Location: [facility];  Service: Orthopedics;  Laterality: Right;  . SHOULDER ARTHROSCOPY Right 07/2016  . TRANSTHORACIC ECHOCARDIOGRAM  09-23-2007   pseudonormal LV filling pattern,  ef 65-70%/  trivial AR/  mild LAE  . TUBAL LIGATION  1985  . WOUND DEBRIDEMENT Left 03/10/2019   Procedure: EXCISION AND DEBRIDEMENT LEFT PERINEAL WOUND;  Surgeon: Jovita Kussmaul, MD;  Location: [facility];  Service: General;  Laterality: Left;    FAMILY HISTORY: Family History  Problem Relation Age of Onset  . Heart Problems Father   . Stroke Mother   . Hypertension Other   . Stroke Other   . Breast cancer Sister   . Healthy Son     SOCIAL HISTORY: Social History   Socioeconomic History  .  Marital status: Widowed    Spouse name: Not on file  . Number of children: 1  . Years of education: College  . Highest education level: Some college, no degree  Occupational History  . Occupation: Glass blower/designer    Employer: [employer]  Tobacco Use  . Smoking status: Never Smoker  . Smokeless tobacco: Never Used  Substance and Sexual Activity  . Alcohol use: Yes    Alcohol/week: 0.0 standard drinks    Comment: 3- 4 times a year  . Drug use: No  . Sexual activity: Not on file  Other Topics Concern  . Not on file  Social History Narrative   Patient lives at home with her dog name "Hot Rod".   Caffeine Use: 2-3 cups of coffee a day    Social Determinants of Health   Financial Resource Strain:   . Difficulty of Paying Living Expenses: Not on file  Food Insecurity:   . Worried About Charity fundraiser in the Last Year: Not on file  . Ran Out of Food in the Last Year: Not on file  Transportation Needs:   . Lack of Transportation (Medical): Not on file  . Lack of Transportation (Non-Medical): Not on file  Physical Activity:   . Days of Exercise per Week: Not on file  . Minutes of Exercise per Session: Not on file  Stress:   . Feeling of Stress : Not on file  Social Connections:   . Frequency of Communication with Friends and Family: Not on file  . Frequency of Social Gatherings with Friends and Family: Not on file  . Attends Religious Services: Not on file  . Active Member of Clubs or Organizations: Not on file  . Attends Archivist Meetings: Not on file  . Marital Status: Not on file  Intimate Partner Violence:   . Fear of Current or Ex-Partner: Not on file  . Emotionally Abused: Not on file  . Physically Abused: Not on file  . Sexually Abused: Not on file      PHYSICAL  EXAM  Vitals:   01/12/20 1028  BP: (!) 143/67  Pulse: 85  Temp: 97.7 F (36.5 C)  TempSrc: Oral  Height: 5' 1.5" (1.562 m)   Body mass index is 33.09 kg/m.   Generalized:  Well developed, in no acute distress  Chest: Lungs clear to auscultation bilaterally  Neurological examination  Mentation: Alert oriented to time, place, history taking. Follows all commands speech and language fluent Cranial nerve II-XII: Extraocular movements were full, visual field were full on confrontational test Head turning and shoulder shrug  were normal and symmetric. Motor: The motor testing reveals 5 over 5 strength of all 4 extremities. Good symmetric motor tone is noted throughout.  Sensory: Sensory testing is intact to soft touch on all 4 extremities. No evidence of extinction is noted.  Gait and station: Gait is normal.      DIAGNOSTIC DATA (LABS, IMAGING, TESTING) - I reviewed patient records, labs, notes, testing and imaging myself where available.  Lab Results  Component Value Date   WBC 10.4 03/11/2019   HGB 10.0 (L) 03/11/2019   HCT 30.5 (L) 03/11/2019   MCV 90.5 03/11/2019   PLT 430 (H) 03/11/2019      Component Value Date/Time   NA 140 03/12/2019 0330   K 3.8 03/12/2019 0330   CL 102 03/12/2019 0330   CO2 28 03/12/2019 0330   GLUCOSE 116 (H) 03/12/2019 0330   BUN 15 03/12/2019 0330   CREATININE 0.62 03/12/2019 0330   CALCIUM 9.4 03/12/2019 0330   PROT 5.8 (L) 03/09/2019 2114   ALBUMIN 2.4 (L) 03/09/2019 2114   AST 15 03/09/2019 2114   ALT <5 03/09/2019 2114   ALKPHOS 67 03/09/2019 2114   BILITOT 0.6 03/09/2019 2114   GFRNONAA >60 03/12/2019 0330   GFRAA >60 03/12/2019 0330   No results found for: CHOL, HDL, LDLCALC, LDLDIRECT, TRIG, CHOLHDL Lab Results  Component Value Date   HGBA1C 5.7 (H) 03/06/2019   Lab Results  Component Value Date   VITAMINB12 313 12/29/2015   Lab Results  Component Value Date   TSH 0.989 03/28/2018      ASSESSMENT AND PLAN 69 y.o. 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[place]), 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[place]), Numbness and tingling in hands, OSA (obstructive sleep apnea), Polyneuropathy, diabetic ([place]), 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 here with:  1.  Obstructive sleep apnea on CPAP  -Suboptimal compliance with high AHI -Encouraged the patient to use machine nightly and greater than 4 hours each night -Increase pressure to 5 cm of water -Discussed with the caregiver some tips to help her use the CPAP at night.  If compliance has not improved at the next visit we will discuss at this should be continued. -Follow-up in 3 months or sooner if  needed   I spent 15 minutes with the patient. 50% of this time was spent reviewing CPAP download   Ward Givens, MSN, NP-C 01/12/2020, 10:33 AM [facility] [address], [place], [place] [address] [phone]

## 2020-01-12 NOTE — Patient Instructions (Signed)
Your Plan:  Continue CPAP  Pressure increase to 5 cmH20 If your symptoms worsen or you develop new symptoms please let us know.       Thank you for coming to see [place] at [facility]. I hope we have been able to provide you high quality care today.  You may receive a patient satisfaction survey over the next few weeks. We would appreciate your feedback and comments so that we may continue to improve ourselves and the health of our patients.

## 2020-01-19 ENCOUNTER — Ambulatory Visit: Payer: PPO | Admitting: Adult Health

## 2020-01-19 DIAGNOSIS — G231 Progressive supranuclear ophthalmoplegia [Steele-Richardson-Olszewski]: Secondary | ICD-10-CM | POA: Diagnosis not present

## 2020-02-04 DIAGNOSIS — G4733 Obstructive sleep apnea (adult) (pediatric): Secondary | ICD-10-CM | POA: Diagnosis not present

## 2020-02-04 NOTE — Progress Notes (Signed)
Assessment/Plan:   1.  PSP  -Decrease carbidopa/levodopa 25/100 from 2 tablets 3 times per day to 1 tablet 3 times per day because of fairly significant dyskinesia and sliding out of the chair.  She does not ambulate any longer.  They are to call me within the next 2 weeks and let me know if this affected her ability to do transfers.  -Patient with 24 hour/day care  -Patient has DNR order in place  2.  Dysphagia and cough, associated with PSP  -Following with Dr. Valeta Harms.  CoughAssist device was ordered for the patient.  Pt not using it  3.  Sleep apnea   -Follows with [facility] neurology.  Has not been compliant recently with CPAP.  Understands morbidity and mortality associated with that.  4.  Follow-up 6 months.  They do not want to do a video visit as they do not have that ability.  They would like to come into the office.  Subjective:   [name] [name] was seen today in follow up for PSP.  Her friend and caregiver, [name], accompanies her and supplements the history.  My previous records were reviewed prior to todays visit as well as outside records available to me.  She saw Dr. Valeta Harms since our last visit.  He spent a significant amount of time with her doing DNR and [facility] form.  He did place an order for a CoughAssist device to help with her airway clearance and gave her a flutter valve and trained them how to use it.  She isn't using that.   Pt isn't walking but she is sliding some out of the chair.  Has 24 hour/day care.  [name] is with her in the day and she has a nighttime caregiver from 7 PM to 7 AM who helps to bathe her.  Current prescribed movement disorder medications: Carbidopa/levodopa 25/100, 2 tablets 3 times per day     ALLERGIES:   Allergies  Allergen Reactions  . Aleve [Naproxen] Hives, Shortness Of Breath and Rash    Pt Avoids All Nsaids  . Protonix [Pantoprazole Sodium] Other (See Comments)    Makes her feel wreidd  . Zocor [Simvastatin] Other  (See Comments)    "weird feeling all over body"    CURRENT MEDICATIONS:  Outpatient Encounter Medications as of 02/08/2020  Medication Sig  . acetaminophen (TYLENOL) 325 MG tablet Take 650 mg by mouth 2 (two) times daily.   Marland Kitchen aspirin 81 MG chewable tablet Chew 81 mg by mouth daily.  Marland Kitchen azelastine (ASTELIN) 0.1 % nasal spray Place 2 sprays into both nostrils 2 (two) times daily.   . carbidopa-levodopa (SINEMET IR) 25-100 MG tablet TAKE 2 TABLETS BY MOUTH 3 TIMES A DAY  . Cholecalciferol (VITAMIN D PO) Take 5,000 Units by mouth daily.  . cyanocobalamin 500 MCG tablet Take 500 mcg by mouth daily.  . diclofenac sodium (VOLTAREN) 1 % GEL Apply 4 g topically 2 (two) times daily.   Marland Kitchen escitalopram (LEXAPRO) 10 MG tablet Take 15 mg by mouth daily.   . fenofibrate 160 MG tablet Take 160 mg by mouth every morning.   . fluticasone (FLONASE) 50 MCG/ACT nasal spray Place 2 sprays into both nostrils daily.   Marland Kitchen gabapentin (NEURONTIN) 100 MG capsule Take 300 mg by mouth at bedtime.  Marland Kitchen loratadine (CLARITIN) 10 MG tablet Take 10 mg by mouth daily.   Marland Kitchen losartan-hydrochlorothiazide (HYZAAR) 100-12.5 MG tablet Take 1 tablet by mouth daily.  . Melatonin 5 MG  CAPS Take 10 mg by mouth at bedtime as needed.   . Menthol, Topical Analgesic, (BIOFREEZE EX) Apply topically.  . montelukast (SINGULAIR) 10 MG tablet Take 10 mg by mouth every evening.   . nystatin cream (MYCOSTATIN) Apply 1 application topically 2 (two) times daily.   Marland Kitchen Respiratory Therapy Supplies (FLUTTER) DEVI Use as directed  . traMADol (ULTRAM) 50 MG tablet Take 50 mg by mouth every 6 (six) hours as needed.  . [DISCONTINUED] mupirocin cream (BACTROBAN) 2 % Apply topically 2 (two) times daily. (Patient not taking: Reported on 02/08/2020)   No facility-administered encounter medications on file as of 02/08/2020.    Objective:   PHYSICAL EXAMINATION:    VITALS:   Vitals:   02/08/20 1101  BP: 135/77  Pulse: 89  SpO2: 96%  Weight: 188 lb (85.3  kg)  Height: 5\' 1"  (1.549 m)    GEN:  The patient appears stated age and is in NAD. HEENT:  Normocephalic, atraumatic.  The mucous membranes are moist. The superficial temporal arteries are without ropiness or tenderness. CV:  RRR Lungs:  CTAB Neck/HEME:  There are no carotid bruits bilaterally.  Neurological examination:  Orientation: The patient is alert and oriented x3. Cranial nerves: There is good facial symmetry with significant facial hypomimia. The speech is pseudobulbar in quality.. Soft palate rises symmetrically and there is no tongue deviation. Hearing is intact to conversational tone. Sensation: Sensation is intact to light touch throughout Motor: Strength is at least antigravity x4.  Movement examination: Tone: There is normal tone in the upper and lower extremities. Abnormal movements: She has moderate amount of dyskinesia.  She is sliding out of the wheelchair. Coordination:  There is  decremation with RAM's, but it is more slow than decremation. Gait and Station: Not tested   Total time spent on today's visit was 30 minutes, including both face-to-face time and nonface-to-face time.  Time included that spent on review of records (prior notes available to me/labs/imaging if pertinent), discussing treatment and goals, answering patient's questions and coordinating care.  Cc:  [name], MD

## 2020-02-08 ENCOUNTER — Encounter: Payer: Self-pay | Admitting: Neurology

## 2020-02-08 ENCOUNTER — Ambulatory Visit (INDEPENDENT_AMBULATORY_CARE_PROVIDER_SITE_OTHER): Payer: PPO | Admitting: Neurology

## 2020-02-08 ENCOUNTER — Other Ambulatory Visit: Payer: Self-pay

## 2020-02-08 VITALS — BP 135/77 | HR 89 | Ht 61.0 in | Wt 188.0 lb

## 2020-02-08 DIAGNOSIS — G231 Progressive supranuclear ophthalmoplegia [Steele-Richardson-Olszewski]: Secondary | ICD-10-CM | POA: Diagnosis not present

## 2020-02-08 DIAGNOSIS — G20 Parkinson's disease: Secondary | ICD-10-CM

## 2020-02-08 DIAGNOSIS — G249 Dystonia, unspecified: Secondary | ICD-10-CM

## 2020-02-08 MED ORDER — CARBIDOPA-LEVODOPA 25-100 MG PO TABS: 1.0000 | ORAL_TABLET | Freq: Three times a day (TID) | ORAL | 1 refills | Status: DC

## 2020-02-08 NOTE — Patient Instructions (Signed)
Within 2 weeks, call me and let me know how wiggly you are with decreasing your carbidopa/levodopa 25/100.    The physicians and staff at [facility] are committed to providing excellent care. You may receive a survey requesting feedback about your experience at our office. We strive to receive "very good" responses to the survey questions. If you feel that your experience would prevent you from giving the office a "very good " response, please contact our office to try to remedy the situation. We may be reached at [phone]. Thank you for taking the time out of your busy day to complete the survey.

## 2020-02-19 DIAGNOSIS — G231 Progressive supranuclear ophthalmoplegia [Steele-Richardson-Olszewski]: Secondary | ICD-10-CM | POA: Diagnosis not present

## 2020-02-22 ENCOUNTER — Telehealth: Payer: Self-pay | Admitting: Neurology

## 2020-02-22 NOTE — Telephone Encounter (Signed)
Find out what "not doing good" means.  She was so wiggly before from the medication that she was falling out of the chair.  She doesn't walk any longer (which was mostly the purpose of the med) so we decreased it.  What is worse with decreasing the medication?

## 2020-02-22 NOTE — Telephone Encounter (Signed)
I would like them to instead try a middle dose because of the extreme dyskinesia that she was previously having, including sliding out of the wheelchair.  Have her take 2 in the AM, 1 in the afternoon, 1 in the evening.

## 2020-02-22 NOTE — Telephone Encounter (Signed)
Pt is not doing good on the dosage change they feel like that they need to go back to two pill and not one of the Carbidopa levodopa

## 2020-02-22 NOTE — Telephone Encounter (Signed)
Pt was having trouble with transferring with taken 1 pill TID of the Carbidopa, so they increased medication back to 2 pills TID as of yesterday they just wanted you to know they increased it back because the 1 pill TID wasn't doing her any good,

## 2020-02-22 NOTE — Telephone Encounter (Signed)
Per Dr Tat called dale back to have pt take 2 in the AM, 1 in the afternoon, 1 in the evening of the carbidopa

## 2020-03-14 DIAGNOSIS — E119 Type 2 diabetes mellitus without complications: Secondary | ICD-10-CM | POA: Diagnosis not present

## 2020-03-14 DIAGNOSIS — Z0000 Encounter for general adult medical examination without abnormal findings: Secondary | ICD-10-CM | POA: Diagnosis not present

## 2020-03-14 DIAGNOSIS — E785 Hyperlipidemia, unspecified: Secondary | ICD-10-CM | POA: Diagnosis not present

## 2020-03-14 DIAGNOSIS — E559 Vitamin D deficiency, unspecified: Secondary | ICD-10-CM | POA: Diagnosis not present

## 2020-03-14 DIAGNOSIS — E782 Mixed hyperlipidemia: Secondary | ICD-10-CM | POA: Diagnosis not present

## 2020-03-20 DIAGNOSIS — G231 Progressive supranuclear ophthalmoplegia [Steele-Richardson-Olszewski]: Secondary | ICD-10-CM | POA: Diagnosis not present

## 2020-03-23 DIAGNOSIS — E559 Vitamin D deficiency, unspecified: Secondary | ICD-10-CM | POA: Diagnosis not present

## 2020-03-23 DIAGNOSIS — I10 Essential (primary) hypertension: Secondary | ICD-10-CM | POA: Diagnosis not present

## 2020-03-23 DIAGNOSIS — E1140 Type 2 diabetes mellitus with diabetic neuropathy, unspecified: Secondary | ICD-10-CM | POA: Diagnosis not present

## 2020-03-23 DIAGNOSIS — E782 Mixed hyperlipidemia: Secondary | ICD-10-CM | POA: Diagnosis not present

## 2020-04-13 ENCOUNTER — Ambulatory Visit: Payer: PPO | Admitting: Adult Health

## 2020-04-13 ENCOUNTER — Other Ambulatory Visit: Payer: Self-pay

## 2020-04-13 VITALS — BP 154/80 | HR 87

## 2020-04-13 DIAGNOSIS — G4733 Obstructive sleep apnea (adult) (pediatric): Secondary | ICD-10-CM

## 2020-04-13 DIAGNOSIS — Z9989 Dependence on other enabling machines and devices: Secondary | ICD-10-CM

## 2020-04-13 NOTE — Patient Instructions (Signed)
Continue using CPAP nightly and greater than 4 hours each night Pressure change to auto 5 to 10 cm of water If your symptoms worsen or you develop new symptoms please let us know.

## 2020-04-13 NOTE — Progress Notes (Signed)
Order for cpap supplies sent to [facility] via community msg. Confirmation received that the order transmitted was successful.  

## 2020-04-13 NOTE — Progress Notes (Signed)
PATIENT: [name] [name] DOB: 12/01/1950  REASON FOR VISIT: follow up HISTORY FROM: patient  HISTORY OF PRESENT ILLNESS: Today 04/13/20:  [name] [name] is a 69 year old female with a history of obstructive sleep apnea on CPAP.  Her download indicates that she use her machine nightly for compliance of 100%.  She use her machine greater than 4 hours 27 days for compliance of 90%.  On average she uses her machine 6 hours and 14 minutes.  Her residual AHI is 14.8 on 5 cm of water with EPR 3.  Leak in the 95th percentile is 49.4 L/min.  Reports that they do change out supplies regularly.  Returns today for follow-up  HISTORY 01/12/20:  [name] [name] is a 69 year old female with a history of obstructive sleep apnea on CPAP.  She returns today for follow-up.  Her download indicates that she uses her machine 30 out of 30 days for compliance of 100%.  She did use her machine greater than 4 hours a night.  On average she uses her machine 1 hour and 26 minutes.  Her residual AHI is 20.6 on 4 cm of water with EPR 3.  With leak at 33.5 L/min.   REVIEW OF SYSTEMS: Out of a complete 14 system review of symptoms, the patient complains only of the following symptoms, and all other reviewed systems are negative.  FSS ESS  ALLERGIES: Allergies  Allergen Reactions  . Aleve [Naproxen] Hives, Shortness Of Breath and Rash    Pt Avoids All Nsaids  . Protonix [Pantoprazole Sodium] Other (See Comments)    Makes her feel wreidd  . Zocor [Simvastatin] Other (See Comments)    "weird feeling all over body"    HOME MEDICATIONS: Outpatient Medications Prior to Visit  Medication Sig Dispense Refill  . acetaminophen (TYLENOL) 325 MG tablet Take 650 mg by mouth 2 (two) times daily.     Marland Kitchen aspirin 81 MG chewable tablet Chew 81 mg by mouth daily.    Marland Kitchen azelastine (ASTELIN) 0.1 % nasal spray Place 2 sprays into both nostrils 2 (two) times daily.   11  . carbidopa-levodopa (SINEMET IR) 25-100 MG tablet Take 1 tablet  by mouth 3 (three) times daily. 270 tablet 1  . Cholecalciferol (VITAMIN D PO) Take 5,000 Units by mouth daily.    . cyanocobalamin 500 MCG tablet Take 500 mcg by mouth daily.    . diclofenac sodium (VOLTAREN) 1 % GEL Apply 4 g topically 2 (two) times daily.     Marland Kitchen escitalopram (LEXAPRO) 10 MG tablet Take 15 mg by mouth daily.   2  . fenofibrate 160 MG tablet Take 160 mg by mouth every morning.     . fluticasone (FLONASE) 50 MCG/ACT nasal spray Place 2 sprays into both nostrils daily.   11  . gabapentin (NEURONTIN) 100 MG capsule Take 300 mg by mouth at bedtime.    Marland Kitchen loratadine (CLARITIN) 10 MG tablet Take 10 mg by mouth daily.     Marland Kitchen losartan-hydrochlorothiazide (HYZAAR) 100-12.5 MG tablet Take 1 tablet by mouth daily.    . Melatonin 5 MG CAPS Take 10 mg by mouth at bedtime as needed.     . Menthol, Topical Analgesic, (BIOFREEZE EX) Apply topically.    . montelukast (SINGULAIR) 10 MG tablet Take 10 mg by mouth every evening.     . nystatin cream (MYCOSTATIN) Apply 1 application topically 2 (two) times daily.     Marland Kitchen Respiratory Therapy Supplies (FLUTTER) DEVI Use as directed 1 each 0  .  traMADol (ULTRAM) 50 MG tablet Take 50 mg by mouth every 6 (six) hours as needed.     No facility-administered medications prior to visit.    PAST MEDICAL HISTORY: Past Medical History:  Diagnosis Date  . Acute kidney failure (HCC)    hx of   . Arthritis    fingers,right shoulder  . Bradycardia   . 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 CHF (congestive heart failure) (HCC)    acute diastolic ( congestive ) heart failure)   . Chronic lumbar radiculopathy    L5- S1  right leg  . Constipation   . Depression   . Diverticulosis large intestine w/o perforation or abscess w/bleeding   . Dizziness   . Dry eye syndrome of bilateral lacrimal glands   . Frequency of urination   . GERD (gastroesophageal reflux disease)   . Hard of hearing   . History of kidney stones 05/12/15     surgery   . HTN (hypertension)   . Hyperlipidemia   . Hypokalemia   . Morbid obesity due to excess calories ([place])   . 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 Numbness and tingling in hands   . OSA (obstructive sleep apnea)    moderate OSA per study 11-22-2007--  refused CPAP but used oxygen for 6 months at night,  states due to wt loss stopped using oxygen  . Polyneuropathy, diabetic ([place])    WALKS W/ CANE FOR BALANCE  . 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 Pyonephrosis   . Pyonephrosis   . Radiculopathy of lumbar region   . Respiratory failure (HCC)    hx of acute respiratory failure wtih hypoxia   . Right ureteral stone   . Severe sepsis with septic shock (CODE) ([place])   . Shortness of breath dyspnea   . Spinal stenosis, cervical region   . SUI (stress urinary incontinence, female)   . Tubulo-interstitial nephritis   . Type 2 diabetes mellitus (HCC)    Type II - Diet controlled  . Urinary incontinence   . Urinary tract infection   . Wears glasses   . Wound of gluteal cleft    Left    PAST SURGICAL HISTORY: Past Surgical History:  Procedure Laterality Date  . ANTERIOR CERVICAL DECOMP/DISCECTOMY FUSION N/A 03/23/2016   Procedure: Cervical Four-Five, Cervical Five-Six, Cervical Six-Seven Anterior cervical decompression/diskectomy/fusion;  Surgeon: Leeroy Cha, MD;  Location: [facility];  Service: Neurosurgery;  Laterality: N/A;  C4-5 C5-6 C6-7 Anterior cervical decompression/diskectomy/fusion  . CARDIOVASCULAR STRESS TEST  09-30-2007     normal Adenosine study/  no ischemia /  normal LV function and wall motion , ef 82%  . COLONOSCOPY    . CYSTOSCOPY WITH RETROGRADE PYELOGRAM, URETEROSCOPY AND STENT PLACEMENT Right 05/12/2015   Procedure: CYSTOSCOPY WITH RETROGRADE PYELOGRAM, URETEROSCOPY,STONE EXTRACTION AND STENT PLACEMENT;  Surgeon: Franchot Gallo, MD;  [facility];  Service: Urology;  Laterality: Right;  . CYSTOSCOPY WITH RETROGRADE PYELOGRAM, URETEROSCOPY AND STENT PLACEMENT Right 04/25/2018   Procedure: CYSTOSCOPY WITH RETROGRADE PYELOGRAM, URETEROSCOPY AND STENT EXCHANGE;  Surgeon: Alexis Frock, MD;  Location: [facility];  Service: Urology;  Laterality: Right;  . CYSTOSCOPY WITH STENT PLACEMENT Right 03/28/2018   Procedure: CYSTOSCOPY WITH STENT PLACEMENT retrograde pylegram;  Surgeon: Alexis Frock, MD;  Location: [facility];  Service: Urology;  Laterality: Right;  .  EXTRACORPOREAL SHOCK WAVE LITHOTRIPSY Right 08-18-2012  . HOLMIUM LASER APPLICATION Right [id number]   Procedure: HOLMIUM LASER APPLICATION;  Surgeon: Franchot Gallo, MD;  [facility];  Service: Urology;  Laterality: Right;  . HOLMIUM LASER APPLICATION Right [id number]   Procedure: HOLMIUM LASER APPLICATION;  Surgeon: Alexis Frock, MD;  Location: [facility];  Service: Urology;  Laterality: Right;  . ORIF HUMERUS FRACTURE Right 07/20/2016   Procedure: OPEN REDUCTION INTERNAL FIXATION (ORIF) PROXIMAL HUMERUS FRACTURE VS REVERSE TOTAL SHOULDER ARTHROPLASTY;  Surgeon: Netta Cedars, MD;  Location: [facility];  Service: Orthopedics;  Laterality: Right;  . SHOULDER ARTHROSCOPY Right 07/2016  . TRANSTHORACIC ECHOCARDIOGRAM  09-23-2007   pseudonormal LV filling pattern,  ef 65-70%/  trivial AR/  mild LAE  . TUBAL LIGATION  1985  . WOUND DEBRIDEMENT Left 03/10/2019   Procedure: EXCISION AND DEBRIDEMENT LEFT PERINEAL WOUND;  Surgeon: Jovita Kussmaul, MD;  Location: [facility];  Service: General;  Laterality: Left;    FAMILY HISTORY: Family History  Problem Relation Age of Onset  . Heart Problems Father   . Stroke Mother   . Hypertension Other   . Stroke Other   . Breast cancer Sister   . Healthy Son     SOCIAL HISTORY: Social History   Socioeconomic History  . Marital status: Widowed    Spouse name: Not on file  . Number of children: 1  . Years of education:  College  . Highest education level: Some college, no degree  Occupational History  . Occupation: Glass blower/designer    Employer: [employer]  Tobacco Use  . Smoking status: Never Smoker  . Smokeless tobacco: Never Used  Substance and Sexual Activity  . Alcohol use: Yes    Alcohol/week: 0.0 standard drinks    Comment: 3- 4 times a year  . Drug use: No  . Sexual activity: Not on file  Other Topics Concern  . Not on file  Social History Narrative   Patient lives at home with her dog name "Hot Rod".   Caffeine Use: 2-3 cups of coffee a day    Social Determinants of Health   Financial Resource Strain:   . Difficulty of Paying Living Expenses:   Food Insecurity:   . Worried About Charity fundraiser in the Last Year:   . Arboriculturist in the Last Year:   Transportation Needs:   . Film/video editor (Medical):   Marland Kitchen Lack of Transportation (Non-Medical):   Physical Activity:   . Days of Exercise per Week:   . Minutes of Exercise per Session:   Stress:   . Feeling of Stress :   Social Connections:   . Frequency of Communication with Friends and Family:   . Frequency of Social Gatherings with Friends and Family:   . Attends Religious Services:   . Active Member of Clubs or Organizations:   . Attends Archivist Meetings:   Marland Kitchen Marital Status:   Intimate Partner Violence:   . Fear of Current or Ex-Partner:   . Emotionally Abused:   Marland Kitchen Physically Abused:   . Sexually Abused:       PHYSICAL EXAM  Vitals:   04/13/20 1046  BP: (!) 154/80  Pulse: 87   There is no height or weight on file to calculate BMI.  Generalized: Well developed, in no acute distress  Chest: Lungs clear to auscultation bilaterally  Neurological examination  Mentation: Alert oriented to time, place, history taking. Follows all commands  speech and language fluent Cranial nerve II-XII:  Head turning and shoulder shrug  were normal and symmetric. Motor: The motor testing reveals 5 over 5  strength of all 4 extremities. Good symmetric motor tone is noted throughout.  Sensory: Sensory testing is intact to soft touch on all 4 extremities. No evidence of extinction is noted.  Gait and station: Gait is normal.    DIAGNOSTIC DATA (LABS, IMAGING, TESTING) - I reviewed patient records, labs, notes, testing and imaging myself where available.  Lab Results  Component Value Date   WBC 10.4 03/11/2019   HGB 10.0 (L) 03/11/2019   HCT 30.5 (L) 03/11/2019   MCV 90.5 03/11/2019   PLT 430 (H) 03/11/2019      Component Value Date/Time   NA 140 03/12/2019 0330   K 3.8 03/12/2019 0330   CL 102 03/12/2019 0330   CO2 28 03/12/2019 0330   GLUCOSE 116 (H) 03/12/2019 0330   BUN 15 03/12/2019 0330   CREATININE 0.62 03/12/2019 0330   CALCIUM 9.4 03/12/2019 0330   PROT 5.8 (L) 03/09/2019 2114   ALBUMIN 2.4 (L) 03/09/2019 2114   AST 15 03/09/2019 2114   ALT <5 03/09/2019 2114   ALKPHOS 67 03/09/2019 2114   BILITOT 0.6 03/09/2019 2114   GFRNONAA >60 03/12/2019 0330   GFRAA >60 03/12/2019 0330    Lab Results  Component Value Date   HGBA1C 5.7 (H) 03/06/2019   Lab Results  Component Value Date   VITAMINB12 313 12/29/2015   Lab Results  Component Value Date   TSH 0.989 03/28/2018      ASSESSMENT AND PLAN 69 y.o. 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[place]), 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[place]), Numbness and tingling in hands, OSA  (obstructive sleep apnea), Polyneuropathy, diabetic ([place]), 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 here with:  1. OSA on CPAP  - CPAP compliance excellent -Residual AHI increased will change to auto 5 to 10 cm of water - Encourage patient to use CPAP nightly and > 4 hours each night - F/U in 6 months  or sooner if needed   I spent 20 minutes of face-to-face and non-face-to-face time with patient.  This included previsit chart review, lab review, study review, order entry, electronic health record documentation, patient education.  Ward Givens, MSN, NP-C 04/13/2020, 10:55 AM [facility] [address], [place], [place] [address] [phone]

## 2020-04-19 ENCOUNTER — Other Ambulatory Visit: Payer: Self-pay | Admitting: Neurology

## 2020-04-20 DIAGNOSIS — G231 Progressive supranuclear ophthalmoplegia [Steele-Richardson-Olszewski]: Secondary | ICD-10-CM | POA: Diagnosis not present

## 2020-05-02 NOTE — Progress Notes (Signed)
[facility] Telephone: [phone] Fax: [phone]  PATIENT NAME: [name] [name] DOB: 08/20/1951 MRN: [id number]  PRIMARY CARE PROVIDER:   [name], MD  REFERRING PROVIDER:  [name], MD [address] [place], [place] [address]  RESPONSIBLE PARTY:   Self    RECOMMENDATIONS and PLAN:  Palliative care encounter Z51.5   1.  Advance care planning:  Advanced directives:  DNAR, limited additional interventions, antibiotics and IV therapy if indicated.   Goals: avoid re-hospitalization and maintain current quailty of life. She plans on continuing residence at [facility] facility with assistance of hired caregivers. Palliative care will continue to follow-up with patient.   2. Generalized weakness:  Chronic state without recent falls.  Continue activities with assistance.  Balanced meals and remain hydrated. Monitor for increased weakness related to neurological decline.  3..  Dysphagia:  Chronic.  Aspiration precautions reviewed and encouraged. Monitor.     4.  Perineal/buttock wound:  Improved.  Monitor                                                                                                               .  I spent  30 minutes providing this consultation,  from 11:00am to 11:30am.   More than 50% of the time in this consultation was spent coordinating communication with patient and her caregiver   .  Follow-up with [address]  She denies any recent falls  or dysphagia.  She was hospitalized in April due to sepsis from a perineal/buttock wound.  Home health had performed wound care. No complaints related to this site.  She still has full-time in home assistance for her ADLs, meals and custodial affairs.  She is chair/ bed-bound and is mobile via wheelchiair due to PSP.  Palliative care was asked to assist with goals of care.   CODE STATUS: DNR  PPS: 40% HOSPICE ELIGIBILITY/DIAGNOSIS: TBD  PAST MEDICAL HISTORY:  Past Medical History:   Diagnosis Date  . Acute kidney failure (HCC)    hx of   . Arthritis    fingers,right shoulder  . Bradycardia   . 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 CHF (congestive heart failure) (HCC)    acute diastolic ( congestive ) heart failure)   . Chronic lumbar radiculopathy    L5- S1  right leg  . Constipation   . Depression   . Diverticulosis large intestine w/o perforation or abscess w/bleeding   . Dizziness   . Dry eye syndrome of bilateral lacrimal glands   . Frequency of urination   . GERD (gastroesophageal reflux disease)   . Hard of hearing   . History of kidney stones 05/12/15   surgery   . HTN (hypertension)   . Hyperlipidemia   . Hypokalemia   . Morbid obesity due to excess calories ([place])   . 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 Numbness and tingling  in hands   . OSA (obstructive sleep apnea)    moderate OSA per study 11-22-2007--  refused CPAP but used oxygen for 6 months at night,  states due to wt loss stopped using oxygen  . Polyneuropathy, diabetic ([place])    WALKS W/ CANE FOR BALANCE  . 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 Pyonephrosis   . Pyonephrosis   . Radiculopathy of lumbar region   . Respiratory failure (HCC)    hx of acute respiratory failure wtih hypoxia   . Right ureteral stone   . Severe sepsis with septic shock (CODE) ([place])   . Shortness of breath dyspnea   . Spinal stenosis, cervical region   . SUI (stress urinary incontinence, female)   . Tubulo-interstitial nephritis   . Type 2 diabetes mellitus (HCC)    Type II - Diet controlled  . Urinary incontinence   . Urinary tract infection   . Wears glasses      PHYSICAL EXAM:   General: Well nourished elderly female in recliner Resp: No increased respiratory effort Abd:  Soft and nontender.  Active BS. Ext:  No edema reported by patient.   Neurological: A&O.  Forced  speech noted. Generalized weakness  Gonzella Lex, NP-C

## 2020-05-19 DIAGNOSIS — M170 Bilateral primary osteoarthritis of knee: Secondary | ICD-10-CM | POA: Diagnosis not present

## 2020-05-20 DIAGNOSIS — G231 Progressive supranuclear ophthalmoplegia [Steele-Richardson-Olszewski]: Secondary | ICD-10-CM | POA: Diagnosis not present

## 2020-05-26 DIAGNOSIS — M170 Bilateral primary osteoarthritis of knee: Secondary | ICD-10-CM | POA: Diagnosis not present

## 2020-05-26 DIAGNOSIS — M1712 Unilateral primary osteoarthritis, left knee: Secondary | ICD-10-CM | POA: Diagnosis not present

## 2020-05-26 DIAGNOSIS — M1711 Unilateral primary osteoarthritis, right knee: Secondary | ICD-10-CM | POA: Diagnosis not present

## 2020-06-02 DIAGNOSIS — M170 Bilateral primary osteoarthritis of knee: Secondary | ICD-10-CM | POA: Diagnosis not present

## 2020-06-13 DIAGNOSIS — Z1339 Encounter for screening examination for other mental health and behavioral disorders: Secondary | ICD-10-CM | POA: Diagnosis not present

## 2020-06-13 DIAGNOSIS — Z1331 Encounter for screening for depression: Secondary | ICD-10-CM | POA: Diagnosis not present

## 2020-06-13 DIAGNOSIS — Z0000 Encounter for general adult medical examination without abnormal findings: Secondary | ICD-10-CM | POA: Diagnosis not present

## 2020-06-16 DIAGNOSIS — G20 Parkinson's disease: Secondary | ICD-10-CM | POA: Diagnosis not present

## 2020-06-16 DIAGNOSIS — Z7982 Long term (current) use of aspirin: Secondary | ICD-10-CM | POA: Diagnosis not present

## 2020-06-16 DIAGNOSIS — Z79899 Other long term (current) drug therapy: Secondary | ICD-10-CM | POA: Diagnosis not present

## 2020-06-16 DIAGNOSIS — Z79891 Long term (current) use of opiate analgesic: Secondary | ICD-10-CM | POA: Diagnosis not present

## 2020-06-16 DIAGNOSIS — G231 Progressive supranuclear ophthalmoplegia [Steele-Richardson-Olszewski]: Secondary | ICD-10-CM | POA: Diagnosis not present

## 2020-06-16 DIAGNOSIS — Z9181 History of falling: Secondary | ICD-10-CM | POA: Diagnosis not present

## 2020-06-16 DIAGNOSIS — R4781 Slurred speech: Secondary | ICD-10-CM | POA: Diagnosis not present

## 2020-06-20 DIAGNOSIS — G231 Progressive supranuclear ophthalmoplegia [Steele-Richardson-Olszewski]: Secondary | ICD-10-CM | POA: Diagnosis not present

## 2020-06-28 DIAGNOSIS — G231 Progressive supranuclear ophthalmoplegia [Steele-Richardson-Olszewski]: Secondary | ICD-10-CM | POA: Diagnosis not present

## 2020-06-28 DIAGNOSIS — Z9181 History of falling: Secondary | ICD-10-CM | POA: Diagnosis not present

## 2020-06-28 DIAGNOSIS — R4781 Slurred speech: Secondary | ICD-10-CM | POA: Diagnosis not present

## 2020-06-28 DIAGNOSIS — Z79899 Other long term (current) drug therapy: Secondary | ICD-10-CM | POA: Diagnosis not present

## 2020-06-28 DIAGNOSIS — G20 Parkinson's disease: Secondary | ICD-10-CM | POA: Diagnosis not present

## 2020-06-28 DIAGNOSIS — Z7982 Long term (current) use of aspirin: Secondary | ICD-10-CM | POA: Diagnosis not present

## 2020-06-28 DIAGNOSIS — Z79891 Long term (current) use of opiate analgesic: Secondary | ICD-10-CM | POA: Diagnosis not present

## 2020-07-01 ENCOUNTER — Emergency Department (HOSPITAL_BASED_OUTPATIENT_CLINIC_OR_DEPARTMENT_OTHER)
Admission: EM | Admit: 2020-07-01 | Discharge: 2020-07-02 | Disposition: A | Discharge status: Home / Self Care | Payer: PPO | Attending: Emergency Medicine | Admitting: Emergency Medicine

## 2020-07-01 ENCOUNTER — Emergency Department (HOSPITAL_BASED_OUTPATIENT_CLINIC_OR_DEPARTMENT_OTHER): Payer: PPO

## 2020-07-01 ENCOUNTER — Encounter (HOSPITAL_BASED_OUTPATIENT_CLINIC_OR_DEPARTMENT_OTHER): Payer: Self-pay

## 2020-07-01 ENCOUNTER — Other Ambulatory Visit: Payer: Self-pay

## 2020-07-01 DIAGNOSIS — Y999 Unspecified external cause status: Secondary | ICD-10-CM | POA: Insufficient documentation

## 2020-07-01 DIAGNOSIS — Y9389 Activity, other specified: Secondary | ICD-10-CM | POA: Diagnosis not present

## 2020-07-01 DIAGNOSIS — Z79899 Other long term (current) drug therapy: Secondary | ICD-10-CM | POA: Diagnosis not present

## 2020-07-01 DIAGNOSIS — I6389 Other cerebral infarction: Secondary | ICD-10-CM | POA: Diagnosis not present

## 2020-07-01 DIAGNOSIS — I509 Heart failure, unspecified: Secondary | ICD-10-CM | POA: Insufficient documentation

## 2020-07-01 DIAGNOSIS — I110 Hypertensive heart disease with heart failure: Secondary | ICD-10-CM | POA: Diagnosis not present

## 2020-07-01 DIAGNOSIS — G9389 Other specified disorders of brain: Secondary | ICD-10-CM | POA: Diagnosis not present

## 2020-07-01 DIAGNOSIS — Y9289 Other specified places as the place of occurrence of the external cause: Secondary | ICD-10-CM | POA: Insufficient documentation

## 2020-07-01 DIAGNOSIS — I10 Essential (primary) hypertension: Secondary | ICD-10-CM | POA: Diagnosis not present

## 2020-07-01 DIAGNOSIS — S0990XA Unspecified injury of head, initial encounter: Secondary | ICD-10-CM | POA: Diagnosis not present

## 2020-07-01 DIAGNOSIS — E119 Type 2 diabetes mellitus without complications: Secondary | ICD-10-CM | POA: Insufficient documentation

## 2020-07-01 DIAGNOSIS — W19XXXA Unspecified fall, initial encounter: Secondary | ICD-10-CM

## 2020-07-01 DIAGNOSIS — S42102A Fracture of unspecified part of scapula, left shoulder, initial encounter for closed fracture: Secondary | ICD-10-CM | POA: Diagnosis not present

## 2020-07-01 DIAGNOSIS — R52 Pain, unspecified: Secondary | ICD-10-CM | POA: Diagnosis not present

## 2020-07-01 DIAGNOSIS — W050XXA Fall from non-moving wheelchair, initial encounter: Secondary | ICD-10-CM | POA: Insufficient documentation

## 2020-07-01 MED ORDER — HYDROCODONE-ACETAMINOPHEN 5-325 MG PO TABS: 2.0000 | ORAL_TABLET | ORAL | 0 refills | Status: AC | PRN

## 2020-07-01 MED ORDER — OXYCODONE-ACETAMINOPHEN 5-325 MG PO TABS
1.0000 | ORAL_TABLET | ORAL | Status: DC | PRN
  Administered 2020-07-01: 1 via ORAL
  Filled 2020-07-01: qty 1

## 2020-07-01 NOTE — Discharge Instructions (Signed)
Your x-ray today showed a small fracture in the scapula.  The sling immobilizer for support and stabilization.  Do not wear it 24/7.  At home, gently do range of motion exercises to prevent frozen shoulder syndrome.  Follow-up with referred orthopedic doctor for further evaluation and management.  You can take Tylenol or Ibuprofen as directed for pain. You can alternate Tylenol and Ibuprofen every 4 hours. If you take Tylenol at 1pm, then you can take Ibuprofen at 5pm. Then you can take Tylenol again at 9pm.   Take pain medications as directed for break through pain. Do not drive or operate machinery while taking this medication.   Return to emergency department for any worsening pain, numbness/weakness or any other worsening concerning symptoms.

## 2020-07-01 NOTE — ED Provider Notes (Signed)
[name] [name] Provider Note   CSN: [id number] Arrival date & time: 07/01/20  2056     History Chief Complaint  Patient presents with  . Fall    [name] [name] is a 69 y.o. female with PMH/o Progressive supranuclear palsy who presents for evaluation of left shoulder pain after a fall.  Caregiver is at bedside who helps provide history.  He reports that this evening about 730-8 PM, she was in her wheelchair and was doing exercises and fell. He thinks this might have been witnessed by the wife. He did not witness it personally.  He thinks that she landed on her forehead and left shoulder.  No LOC.  She is on aspirin but no other blood thinners.  She was initially evaluated and did not feel that she needed to come to the emergency [name].  She had persistent left shoulder pain that was not getting any better so she was sent to the ER for further evaluation.  Per caregiver, patient is at her normal mental baseline.  EM level 5 caveat due to patient's history of PSP and difficulty with talking.  The history is provided by the patient.       Past Medical History:  Diagnosis Date  . Acute kidney failure (HCC)    hx of   . Arthritis    fingers,right shoulder  . Bradycardia   . 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 CHF (congestive heart failure) (HCC)    acute diastolic ( congestive ) heart failure)   . Chronic lumbar radiculopathy    L5- S1  right leg  . Constipation   . Depression   . Diverticulosis large intestine w/o perforation or abscess w/bleeding   . Dizziness   . Dry eye syndrome of bilateral lacrimal glands   . Frequency of urination   . GERD (gastroesophageal reflux disease)   . Hard of hearing   . History of kidney stones 05/12/15   surgery   . HTN (hypertension)   . Hyperlipidemia   . Hypokalemia   . Morbid obesity due to excess calories ([place])   . 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 Numbness and tingling in hands   . OSA (obstructive sleep apnea)    moderate OSA per study 11-22-2007--  refused CPAP but used oxygen for 6 months at night,  states due to wt loss stopped using oxygen  . Polyneuropathy, diabetic ([place])    WALKS W/ CANE FOR BALANCE  . 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 Progressive supranuclear palsy ([place])   . Pyonephrosis   . Pyonephrosis   . Radiculopathy of lumbar region   . Respiratory failure (HCC)    hx of acute respiratory failure wtih hypoxia   . Right ureteral stone   . Severe sepsis with septic shock (CODE) ([place])   . Shortness of breath dyspnea   . Spinal stenosis, cervical region   . SUI (stress urinary incontinence, female)   . Tubulo-interstitial nephritis   . Type 2 diabetes mellitus (HCC)    Type II - Diet controlled  . Urinary incontinence   . Urinary tract infection   . Wears glasses   . Wound of gluteal cleft    Left    Patient Active Problem List   Diagnosis Date Noted  . Palliative care encounter 03/09/2019  . Pressure injury of skin 03/06/2019  .  Sepsis ([place]) 03/05/2019  . Cellulitis of buttock, left 03/05/2019  . Bradycardia 04/01/2018  . UTI (urinary tract infection) 04/01/2018  . ARF (acute renal failure) ([place]) 04/01/2018  . Right ureteral stone 04/01/2018  . Acute diastolic heart failure ([place]) 04/01/2018  . Acute respiratory failure with hypoxia ([place])   . Pyelonephritis   . Severe sepsis ([place])   . Pyohydronephrosis 03/29/2018  . Septic shock ([place]) 03/29/2018  . Hypokalemia 03/28/2018  . Severe sepsis with septic shock ([place]) 03/28/2018  . Elevated lactic acid level 03/28/2018  . Elevated troponin 03/28/2018  . PSP (progressive supranuclear palsy) ([place]) 02/18/2018  . Acute serous otitis media of right ear 01/13/2018  . 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/13/2018  . Chronic cough 11/19/2017  . 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 Cervical stenosis of spinal canal  03/23/2016  . OSA on CPAP 12/14/2015  . Dizziness and giddiness 11/24/2013  . Diabetes type 2, uncontrolled ([place]) 11/24/2013  . Severe obesity (BMI >= 40) ([place]) 11/24/2013  . Lumbar radiculopathy 11/24/2013  . Osteoporosis 10/14/2012  . Allergic rhinitis 09/08/2010  . Type II or unspecified type diabetes mellitus without mention of complication, not stated as uncontrolled 08/23/2010  . 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[place]) 07/28/2010  . Vitamin D deficiency 07/28/2010  . Benign essential hypertension 07/26/2010  . Depressive disorder, not elsewhere classified 09/23/2009  . Disorder of bone and cartilage 09/23/2009  . Contact dermatitis and other eczema, due to unspecified cause 06/03/2009  . Diastolic dysfunction 81/19/1478    Past Surgical History:  Procedure Laterality Date  . ANTERIOR CERVICAL DECOMP/DISCECTOMY FUSION N/A 03/23/2016   Procedure: Cervical Four-Five, Cervical Five-Six, Cervical Six-Seven Anterior cervical decompression/diskectomy/fusion;  Surgeon: Leeroy Cha, MD;  Location: [facility];  Service: Neurosurgery;  Laterality: N/A;  C4-5 C5-6 C6-7 Anterior cervical decompression/diskectomy/fusion  . CARDIOVASCULAR STRESS TEST  09-30-2007     normal Adenosine study/  no ischemia /  normal LV function and wall motion , ef 82%  . COLONOSCOPY    . CYSTOSCOPY WITH RETROGRADE PYELOGRAM, URETEROSCOPY AND STENT PLACEMENT Right 05/12/2015   Procedure: CYSTOSCOPY WITH RETROGRADE PYELOGRAM, URETEROSCOPY,STONE EXTRACTION AND STENT PLACEMENT;  Surgeon: Franchot Gallo, MD;  Location: [facility];  Service: Urology;  Laterality: Right;  . CYSTOSCOPY WITH RETROGRADE PYELOGRAM, URETEROSCOPY AND STENT PLACEMENT Right 04/25/2018   Procedure: CYSTOSCOPY WITH RETROGRADE PYELOGRAM, URETEROSCOPY AND STENT EXCHANGE;  Surgeon: Alexis Frock, MD;  Location: [facility];  Service: Urology;  Laterality: Right;  . CYSTOSCOPY WITH STENT PLACEMENT Right  03/28/2018   Procedure: CYSTOSCOPY WITH STENT PLACEMENT retrograde pylegram;  Surgeon: Alexis Frock, MD;  Location: [facility];  Service: Urology;  Laterality: Right;  . EXTRACORPOREAL SHOCK WAVE LITHOTRIPSY Right 08-18-2012  . HOLMIUM LASER APPLICATION Right 2/95/6213   Procedure: HOLMIUM LASER APPLICATION;  Surgeon: Franchot Gallo, MD;  Location: [facility];  Service: Urology;  Laterality: Right;  . HOLMIUM LASER APPLICATION Right 0/86/5784   Procedure: HOLMIUM LASER APPLICATION;  Surgeon: Alexis Frock, MD;  Location: [facility];  Service: Urology;  Laterality: Right;  . ORIF HUMERUS FRACTURE Right 07/20/2016   Procedure: OPEN REDUCTION INTERNAL FIXATION (ORIF) PROXIMAL HUMERUS FRACTURE VS REVERSE TOTAL SHOULDER ARTHROPLASTY;  Surgeon: Netta Cedars, MD;  Location: [facility];  Service: Orthopedics;  Laterality: Right;  . SHOULDER ARTHROSCOPY Right 07/2016  . TRANSTHORACIC ECHOCARDIOGRAM  09-23-2007   pseudonormal LV filling pattern,  ef 65-70%/  trivial AR/  mild LAE  . TUBAL LIGATION  1985  . WOUND  DEBRIDEMENT Left 03/10/2019   Procedure: EXCISION AND DEBRIDEMENT LEFT PERINEAL WOUND;  Surgeon: Jovita Kussmaul, MD;  Location: [facility];  Service: General;  Laterality: Left;     OB History   No obstetric history on file.     Family History  Problem Relation Age of Onset  . Heart Problems Father   . Stroke Mother   . Hypertension Other   . Stroke Other   . Breast cancer Sister   . Healthy Son     Social History   Tobacco Use  . Smoking status: Never Smoker  . Smokeless tobacco: Never Used  Vaping Use  . Vaping Use: Never used  Substance Use Topics  . Alcohol use: Yes    Alcohol/week: 0.0 standard drinks    Comment: 3- 4 times a year  . Drug use: No    Home Medications Prior to Admission medications   Medication Sig Start Date End Date Taking? Authorizing Provider  acetaminophen (TYLENOL) 325 MG tablet Take 650 mg by mouth 2 (two) times daily.     [provider]  aspirin 81 MG chewable tablet Chew 81 mg by mouth daily.    [provider]  azelastine (ASTELIN) 0.1 % nasal spray Place 2 sprays into both nostrils 2 (two) times daily.     [provider]  carbidopa-levodopa (SINEMET IR) 25-100 MG tablet TAKE 2 TABLETS BY MOUTH 3 TIMES A DAY 04/19/20   Tat, Eustace Quail, DO  Cholecalciferol (VITAMIN D PO) Take 5,000 Units by mouth daily.    [provider]  cyanocobalamin 500 MCG tablet Take 500 mcg by mouth daily.    [provider]  diclofenac sodium (VOLTAREN) 1 % GEL Apply 4 g topically 2 (two) times daily.  12/16/18   [provider]  escitalopram (LEXAPRO) 10 MG tablet Take 15 mg by mouth daily.     [provider]  fenofibrate 160 MG tablet Take 160 mg by mouth every morning.     [provider]  fluticasone (FLONASE) 50 MCG/ACT nasal spray Place 2 sprays into both nostrils daily.  12/23/14   [provider]  gabapentin (NEURONTIN) 100 MG capsule Take 300 mg by mouth at bedtime. 12/21/19   [provider]  HYDROcodone-acetaminophen (NORCO/VICODIN) 5-325 MG tablet Take 2 tablets by mouth every 4 (four) hours as needed. 07/01/20   Volanda Napoleon, PA-C  loratadine (CLARITIN) 10 MG tablet Take 10 mg by mouth daily.     [provider]  losartan-hydrochlorothiazide (HYZAAR) 100-12.5 MG tablet Take 1 tablet by mouth daily. 12/17/19   [provider]  Melatonin 5 MG CAPS Take 10 mg by mouth at bedtime as needed.     [provider]  Menthol, Topical Analgesic, (BIOFREEZE EX) Apply topically.    [provider]  montelukast (SINGULAIR) 10 MG tablet Take 10 mg by mouth every evening.     [provider]  nystatin cream (MYCOSTATIN) Apply 1 application topically 2 (two) times daily.     [provider]  Respiratory Therapy Supplies (FLUTTER) DEVI Use as directed 08/27/19   Icard, Octavio Graves, DO  traMADol (ULTRAM) 50 MG tablet  Take 50 mg by mouth every 6 (six) hours as needed. 05/28/19   [provider]    Allergies    Aleve [naproxen], Protonix [pantoprazole sodium], and Zocor [simvastatin]  Review of Systems   Review of Systems  Unable to perform ROS: Patient nonverbal    Physical Exam Updated Vital  Signs BP 139/74 (BP Location: Right Arm)   Pulse 83   Temp 98 F (36.7 C) (Oral)   Resp 20   Wt 86.2 kg   SpO2 100%   BMI 35.90 kg/m   Physical Exam Vitals and nursing note reviewed.  Constitutional:      Appearance: Normal appearance. She is well-developed.  HENT:     Head: Normocephalic and atraumatic.     Comments: No tenderness to palpation of skull. No deformities or crepitus noted. No open wounds, abrasions or lacerations.  Eyes:     General: Lids are normal.     Conjunctiva/sclera: Conjunctivae normal.     Pupils: Pupils are equal, round, and reactive to light.  Neck:     Comments: No deformity or crepitus noted.  Full range of motion with any difficulty. Cardiovascular:     Rate and Rhythm: Normal rate and regular rhythm.     Pulses: Normal pulses.          Radial pulses are 2+ on the right side and 2+ on the left side.     Heart sounds: Normal heart sounds. No murmur heard.  No friction rub. No gallop.   Pulmonary:     Effort: Pulmonary effort is normal.     Breath sounds: Normal breath sounds.  Chest:     Comments: No anterior chest wall tenderness.  No deformity or crepitus noted.  No evidence of flail chest. Abdominal:     Palpations: Abdomen is soft. Abdomen is not rigid.     Tenderness: There is no abdominal tenderness. There is no guarding.  Musculoskeletal:        General: Normal range of motion.     Cervical back: Full passive range of motion without pain.     Comments: There is palpation noted to the left shoulder.  I am able to passively range of motion without any signs of deformity or crepitus noted.  She does have some pain when I do this.  No bony tenderness  noted to the left elbow, left forearm, left wrist.  No bony tenderness noted to the right arm.  Full passive range of motion without any difficulty.  No pelvic instability.  No bony tenderness of the bilateral lower extremities.  No midline T or L-spine tenderness.  Skin:    General: Skin is warm and dry.     Capillary Refill: Capillary refill takes less than 2 seconds.     Comments: Good distal cap refill. LUE is not dusky in appearance or cool to touch.  Neurological:     Mental Status: She is alert and oriented to person, place, and time.     Comments: Patient with slurred speech which is baseline per caregiver. She is able to answer some questions but difficulty understanding her.  Alert and oriented x3. Follows commands, Moves all extremities  5/5 strength to BUE and BLE  Sensation intact throughout all major nerve distributions.   Psychiatric:        Speech: Speech normal.     ED Results / Procedures / Treatments   Labs (all labs ordered are listed, but only abnormal results are displayed) Labs Reviewed - No data to display  EKG None  Radiology CT Head Wo Contrast  Result Date: 07/01/2020 CLINICAL DATA:  Facial trauma, fall EXAM: CT HEAD WITHOUT CONTRAST TECHNIQUE: Contiguous axial images were obtained from the base of the skull through the vertex without intravenous contrast. COMPARISON:  11/22/2017 FINDINGS: Brain: Normal anatomic configuration. Mild parenchymal  volume loss is again noted, slightly progressive since prior examination. Remote lacunar infarct is noted within the left basal ganglia. No abnormal intra or extra-axial mass lesion or fluid collection. No abnormal mass effect or midline shift. No evidence of acute intracranial hemorrhage or infarct. Ventricular size is normal. Cerebellum unremarkable. Vascular: Unremarkable Skull: Intact Sinuses/Orbits: Paranasal sinuses are clear. Orbits are unremarkable. Other: Mastoid air cells and middle ear cavities are clear.  IMPRESSION: No acute intracranial abnormality. Electronically Signed   By: Fidela Salisbury MD   On: 07/01/2020 22:42   DG Shoulder Left  Result Date: 07/01/2020 CLINICAL DATA:  Status post fall. EXAM: LEFT SHOULDER - 2+ VIEW COMPARISON:  None. FINDINGS: A fracture deformity of the inferolateral aspect of the left scapula is seen. There is no evidence of dislocation. There is no evidence of arthropathy or other focal bone abnormality. A 4.3 mm soft tissue calcification is seen adjacent to the greater tubercle of the left humeral head. IMPRESSION: 1. Fracture of the left scapula. 2. Calcific tendinopathy of the left rotator cuff. Electronically Signed   By: Virgina Norfolk M.D.   On: 07/01/2020 22:10    Procedures Procedures (including critical care time)  Medications Ordered in ED Medications  oxyCODONE-acetaminophen (PERCOCET/ROXICET) 5-325 MG per tablet 1 tablet (1 tablet Oral Given 07/01/20 2152)    ED Course  I have reviewed the triage vital signs and the nursing notes.  Pertinent labs & imaging results that were available during my care of the patient were reviewed by me and considered in my medical decision making (see chart for details).    MDM Rules/Calculators/A&P                          69 year old female with possible history of PSP who presents for evaluation of left shoulder pain after mechanical fall.  Caregiver is at bedside who helps with history.  Patient with history of PSP and difficulty communication.  He states that patient is at her baseline.  He states that she is on aspirin.  No LOC.  She has been complaining of left shoulder pain since the fall.  Initially arrival, she is afebrile, nontoxic-appearing.  Vital signs are stable.  She is neurovascular intact.  I am able to passively range of motion her left shoulder she does report with pain with doing so.  No signs of head injury.  We will plan for imaging of head and left shoulder.  Head CT negative for any acute  abnormality.  Shoulder x-ray shows fracture of the left scapula.  No other concerning findings.  Updated patient on plan.  Her caregiver has left.  We will give short course of pain medication for acute breakthrough pain.  Patient placed in sling immobilizer.  Called patient son Eydie Wormley) and updated him on findings here in the ED.  I discussed with him that she will need to follow-up with orthopedics and we will give outpatient orthopedic referral.  He is in agreement and understands. At this time, patient exhibits no emergent life-threatening condition that require further evaluation in ED or admission. Patient had ample opportunity for questions and discussion. All patient's questions were answered with full understanding. Strict return precautions discussed. Patient expresses understanding and agreement to plan.    Portions of this note were generated with Lobbyist. Dictation errors may occur despite best attempts at proofreading.   Final Clinical Impression(s) / ED Diagnoses Final diagnoses:  Fall, initial encounter  Closed  fracture of left scapula, unspecified part of scapula, initial encounter    Rx / DC Orders ED Discharge Orders         Ordered    HYDROcodone-acetaminophen (NORCO/VICODIN) 5-325 MG tablet  Every 4 hours PRN        07/01/20 2304           Volanda Napoleon, PA-C 07/01/20 2340    Lucrezia Starch, MD 07/04/20 1513

## 2020-07-01 NOTE — ED Triage Notes (Signed)
Pt was doing exercises in a wheel chair and fell out of it. Pt c/o pain to L shoulder and is exhibiting guarding. Pt has difficulty communicating at baseline, but is A/Ox4 during triage.

## 2020-07-01 NOTE — ED Notes (Signed)
This Midwife for transportation home.

## 2020-07-02 DIAGNOSIS — Z7401 Bed confinement status: Secondary | ICD-10-CM | POA: Diagnosis not present

## 2020-07-02 DIAGNOSIS — R001 Bradycardia, unspecified: Secondary | ICD-10-CM | POA: Diagnosis not present

## 2020-07-02 DIAGNOSIS — M2550 Pain in unspecified joint: Secondary | ICD-10-CM | POA: Diagnosis not present

## 2020-07-04 DIAGNOSIS — W182XXA Fall in (into) shower or empty bathtub, initial encounter: Secondary | ICD-10-CM | POA: Diagnosis not present

## 2020-07-04 DIAGNOSIS — S4290XA Fracture of unspecified shoulder girdle, part unspecified, initial encounter for closed fracture: Secondary | ICD-10-CM | POA: Diagnosis not present

## 2020-07-07 DIAGNOSIS — E1149 Type 2 diabetes mellitus with other diabetic neurological complication: Secondary | ICD-10-CM | POA: Diagnosis not present

## 2020-07-07 DIAGNOSIS — S4290XA Fracture of unspecified shoulder girdle, part unspecified, initial encounter for closed fracture: Secondary | ICD-10-CM | POA: Diagnosis not present

## 2020-07-07 DIAGNOSIS — G1220 Motor neuron disease, unspecified: Secondary | ICD-10-CM | POA: Diagnosis not present

## 2020-07-13 ENCOUNTER — Other Ambulatory Visit: Payer: Self-pay | Admitting: Neurology

## 2020-07-13 DIAGNOSIS — Z7982 Long term (current) use of aspirin: Secondary | ICD-10-CM | POA: Diagnosis not present

## 2020-07-13 DIAGNOSIS — Z9181 History of falling: Secondary | ICD-10-CM | POA: Diagnosis not present

## 2020-07-13 DIAGNOSIS — G231 Progressive supranuclear ophthalmoplegia [Steele-Richardson-Olszewski]: Secondary | ICD-10-CM | POA: Diagnosis not present

## 2020-07-13 DIAGNOSIS — R4781 Slurred speech: Secondary | ICD-10-CM | POA: Diagnosis not present

## 2020-07-13 DIAGNOSIS — Z79891 Long term (current) use of opiate analgesic: Secondary | ICD-10-CM | POA: Diagnosis not present

## 2020-07-13 DIAGNOSIS — Z79899 Other long term (current) drug therapy: Secondary | ICD-10-CM | POA: Diagnosis not present

## 2020-07-13 DIAGNOSIS — G20 Parkinson's disease: Secondary | ICD-10-CM | POA: Diagnosis not present

## 2020-07-21 DIAGNOSIS — G231 Progressive supranuclear ophthalmoplegia [Steele-Richardson-Olszewski]: Secondary | ICD-10-CM | POA: Diagnosis not present

## 2020-07-30 DIAGNOSIS — G4733 Obstructive sleep apnea (adult) (pediatric): Secondary | ICD-10-CM | POA: Diagnosis not present

## 2020-08-10 NOTE — Progress Notes (Signed)
Assessment/Plan:   1.  PSP  -pt has dramatically declined.  Long discussion with patient and caregiver [name] about hospice.  Pt and caregiver both agreed that this would be valuable and will put in consult today.  Level of care discussed with them.  Understand that this is comfort care with hospice.  Understand goals.    -Patient understands differences between PSP and Parkinson's disease.  Understands that there is an increased risk of falls, aspiration as subsequent morbidity and mortality from this disease compared to Parkinson's disease.  -Discussed again with patient that she should not be walking at all, including the transfer, unless someone is directly with her.  -they want to continue carbidopa/levodopa 25/100, 2/1/1 despite knowing that this is causing dyskinesia  -pt needs hospital bed.  Hospice should be able to assist  2.  Dysphagia and cough, associated with PSP  -Patient has seen Dr. Valeta Harms.  CoughAssist device was ordered for the patient, but patient does not use it.  3.  Sleep apnea  -Follows with [facility] neurology.  Wearing cpap now  4.  Pt wants to continue to f/u here.  Told her we may need to transition next visit to [facility] given deteriorating state.  She agrees   Subjective:   [name] [name] was seen today in follow up for PSP.  My previous records were reviewed prior to todays visit as well as outside records available to me.  Last visit, I dropped the patient's levodopa from 2 tablets 3 times per day to 1 tablet 3 times per day, because she was having such extreme dyskinesia that she was falling out of the wheelchair.  She no longer walked, so I did not think it was going to make a huge difference.  I did ask them to call me back and let me know how she was doing in a week.  They called me back a few weeks later and stated that they went up again to 2 tablets 3 times per day.  I asked them not to do that given the issues she had prior with the higher dosage and instead  I asked them to take 2 tablets in the morning, 1 in the afternoon and 1 in the evening.  I have not heard back since.  Today they state that this is what she is doing.  Patient was in the emergency room on August 20 after a fall.  X-rays were concerning for fracture of the left scapula.  Pt was in the bathroom and she cannot remember if she was trying to transfer but she just knows that she was trying not to fall.  Current prescribed movement disorder medications: Carbidopa/levodopa 25/100, 2/1/1(decreased from 2 tablets 3 times per day last visit)    ALLERGIES:   Allergies  Allergen Reactions  . Aleve [Naproxen] Hives, Shortness Of Breath and Rash    Pt Avoids All Nsaids  . Protonix [Pantoprazole Sodium] Other (See Comments)    Makes her feel wreidd  . Zocor [Simvastatin] Other (See Comments)    "weird feeling all over body"    CURRENT MEDICATIONS:  Outpatient Encounter Medications as of 08/15/2020  Medication Sig  . acetaminophen (TYLENOL) 325 MG tablet Take 650 mg by mouth 2 (two) times daily.   Marland Kitchen aspirin 81 MG chewable tablet Chew 81 mg by mouth daily.  Marland Kitchen azelastine (ASTELIN) 0.1 % nasal spray Place 2 sprays into both nostrils 2 (two) times daily.   . carbidopa-levodopa (SINEMET IR) 25-100 MG tablet  Take 2 tabs in the morning, 1 in the afternoon and 1 in the evening  . Cholecalciferol (VITAMIN D PO) Take 5,000 Units by mouth daily.  . cyanocobalamin 500 MCG tablet Take 500 mcg by mouth daily.  . diclofenac sodium (VOLTAREN) 1 % GEL Apply 4 g topically 2 (two) times daily.   Marland Kitchen escitalopram (LEXAPRO) 10 MG tablet Take 15 mg by mouth daily.   . fenofibrate 160 MG tablet Take 160 mg by mouth every morning.   . fluticasone (FLONASE) 50 MCG/ACT nasal spray Place 2 sprays into both nostrils daily.   Marland Kitchen gabapentin (NEURONTIN) 100 MG capsule Take 300 mg by mouth at bedtime.  Marland Kitchen HYDROcodone-acetaminophen (NORCO/VICODIN) 5-325 MG tablet Take 2 tablets by mouth every 4 (four) hours as needed.   . loratadine (CLARITIN) 10 MG tablet Take 10 mg by mouth daily.   Marland Kitchen losartan-hydrochlorothiazide (HYZAAR) 100-12.5 MG tablet Take 1 tablet by mouth daily.  . Melatonin 5 MG CAPS Take 10 mg by mouth at bedtime as needed.   . Menthol, Topical Analgesic, (BIOFREEZE EX) Apply topically.  . montelukast (SINGULAIR) 10 MG tablet Take 10 mg by mouth every evening.   . nystatin cream (MYCOSTATIN) Apply 1 application topically 2 (two) times daily.   Marland Kitchen Respiratory Therapy Supplies (FLUTTER) DEVI Use as directed  . traMADol (ULTRAM) 50 MG tablet Take 50 mg by mouth every 6 (six) hours as needed.   No facility-administered encounter medications on file as of 08/15/2020.    Objective:   PHYSICAL EXAMINATION:    VITALS:   Vitals:   08/15/20 1104  BP: (!) 142/81  Pulse: 88  SpO2: 96%  Height: 5\' 1"  (1.549 m)    GEN:  The patient appears stated age and is in NAD. HEENT:  Normocephalic, atraumatic.  The mucous membranes are moist. The superficial temporal arteries are without ropiness or tenderness. CV:  RRR Lungs:  CTAB Neck/HEME:  There are no carotid bruits bilaterally.  Neurological examination:  Orientation: The patient is alert and oriented x3. Cranial nerves: There is intermittent closure of R and L eye, independent of one another.  She is moaning in visit.  She is dysarthric, pseudobulbar in speech but speech is fluent when spoken. Sensation: Sensation is intact to light touch throughout Motor: Strength is at least antigravity x4.  Movement examination: Tone: There is normal tone in the ue/le Abnormal movements: pt is dyskinetic and tied into the transport chair via gait belt but it nearly falls forward several times due to her movements.  Her caregiver eventually stands behind it to hold it down  I have reviewed and interpreted the following labs independently    Chemistry      Component Value Date/Time   NA 140 03/12/2019 0330   K 3.8 03/12/2019 0330   CL 102 03/12/2019 0330    CO2 28 03/12/2019 0330   BUN 15 03/12/2019 0330   CREATININE 0.62 03/12/2019 0330      Component Value Date/Time   CALCIUM 9.4 03/12/2019 0330   ALKPHOS 67 03/09/2019 2114   AST 15 03/09/2019 2114   ALT <5 03/09/2019 2114   BILITOT 0.6 03/09/2019 2114       Lab Results  Component Value Date   WBC 10.4 03/11/2019   HGB 10.0 (L) 03/11/2019   HCT 30.5 (L) 03/11/2019   MCV 90.5 03/11/2019   PLT 430 (H) 03/11/2019    Lab Results  Component Value Date   TSH 0.989 03/28/2018     Total time  spent on today's visit was 40 minutes, including both face-to-face time and nonface-to-face time.  Time included that spent on review of records (prior notes available to me/labs/imaging if pertinent), discussing treatment and goals, answering patient's questions and coordinating care.  Cc:  [name], MD

## 2020-08-15 ENCOUNTER — Other Ambulatory Visit: Payer: Self-pay

## 2020-08-15 ENCOUNTER — Encounter: Payer: Self-pay | Admitting: Neurology

## 2020-08-15 ENCOUNTER — Ambulatory Visit (INDEPENDENT_AMBULATORY_CARE_PROVIDER_SITE_OTHER): Payer: PPO | Admitting: Neurology

## 2020-08-15 VITALS — BP 142/81 | HR 88 | Ht 61.0 in

## 2020-08-15 DIAGNOSIS — G231 Progressive supranuclear ophthalmoplegia [Steele-Richardson-Olszewski]: Secondary | ICD-10-CM | POA: Diagnosis not present

## 2020-08-15 DIAGNOSIS — Z515 Encounter for palliative care: Secondary | ICD-10-CM

## 2020-08-20 DIAGNOSIS — G231 Progressive supranuclear ophthalmoplegia [Steele-Richardson-Olszewski]: Secondary | ICD-10-CM | POA: Diagnosis not present

## 2020-09-22 DIAGNOSIS — M25612 Stiffness of left shoulder, not elsewhere classified: Secondary | ICD-10-CM | POA: Diagnosis not present

## 2020-09-22 DIAGNOSIS — M25511 Pain in right shoulder: Secondary | ICD-10-CM | POA: Diagnosis not present

## 2020-10-11 DIAGNOSIS — H2513 Age-related nuclear cataract, bilateral: Secondary | ICD-10-CM | POA: Diagnosis not present

## 2020-10-25 ENCOUNTER — Telehealth (INDEPENDENT_AMBULATORY_CARE_PROVIDER_SITE_OTHER): Admitting: Adult Health

## 2020-10-25 DIAGNOSIS — Z9989 Dependence on other enabling machines and devices: Secondary | ICD-10-CM | POA: Diagnosis not present

## 2020-10-25 DIAGNOSIS — G4733 Obstructive sleep apnea (adult) (pediatric): Secondary | ICD-10-CM | POA: Diagnosis not present

## 2020-10-25 NOTE — Progress Notes (Addendum)
  [facility] [address] [place]. [place] [address] [phone]     Virtual Visit via Telephone Note  I connected with [name] [name] on 10/25/20 at  1:45 PM EST by telephone located remotely at [facility] and verified that I am speaking with the correct person using two identifiers who reports being located at [place] home.    Visit scheduled by [facility] staff. She discussed the limitations, risks, security and privacy concerns of performing an evaluation and management service by telephone and the availability of in person appointments. I also discussed with the patient that there may be a patient responsible charge related to this service. The patient expressed understanding and agreed to proceed. See telephone note for consent and additional scheduling information.    History of Present Illness:  [name] [name] is a 69 y.o. female who has been followed in this office for obstructive sleep apnea on CPAP.  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  Hospice care was recently consulted.  Her caregiver [name] confirms that she is under hospice care currently.  He states that she does have a hard time using the CPAP at night.  Her download indicates that she use her machine nightly for compliance of 100%.  She is machine greater than 4 hours only 9 days for compliance of 30%.  On average she uses the machine 3 hours and 20 minutes.  Her residual AHI is 13.6 on 5 to 10 cm of water with EPR 3.  Leak in the 95th percentile is 69.6 L/min.  The caregiver states that she is very fidgety at night therefore she does not keep the machine on long.   Observations/Objective:  Generalized: Well developed, in no acute distress   Neurological examination  Mentation: Alert oriented to time, place, history taking. Follows all commands speech and language fluent  Assessment and Plan:  1: Obstructive sleep apnea on CPAP  Discussed with caregiver- Dale-it is at the  patient's discretion whether she wants to continue CPAP therapy.  She is now on hospice care.  If the patient is more comfortable not using CPAP that will be her discretion.  Caregiver states that he will discuss this with the patient and her family and let us know their final decision.   Follow Up Instructions:   F/U as needed.      I discussed the assessment and treatment plan with the patient.  The patient was provided an opportunity to ask questions and all were answered to their satisfaction. The patient agreed with the plan and verbalized an understanding of the instructions.   I spent 36minutes of face-to-face and non-face-to-face time with patient.  This included previsit chart review, lab review, study review, order entry, electronic health record documentation, patient education.      Ward Givens NP-C  [facility] [address] [place], [place] [address]  Phone [phone] Fax [phone]    I reviewed the above note and documentation by the Nurse Practitioner and agree with the history, exam, assessment and plan as outlined above. I was available for consultation. Star Age, MD, PhD [facility])

## 2020-10-31 ENCOUNTER — Telehealth: Payer: Self-pay | Admitting: Adult Health

## 2020-10-31 DIAGNOSIS — G4733 Obstructive sleep apnea (adult) (pediatric): Secondary | ICD-10-CM

## 2020-10-31 NOTE — Telephone Encounter (Signed)
Pt's caregiver, Lenox Ponds (on DPR) called, she is in Hospice care, will not continue with the CPAP.

## 2020-11-01 NOTE — Progress Notes (Signed)
Pt no longer using cpap, orders sent to DME

## 2020-11-01 NOTE — Telephone Encounter (Signed)
Community message sent to DME

## 2020-12-06 DIAGNOSIS — M170 Bilateral primary osteoarthritis of knee: Secondary | ICD-10-CM | POA: Diagnosis not present

## 2021-02-14 ENCOUNTER — Ambulatory Visit: Payer: PPO | Admitting: Neurology

## 2021-02-14 DIAGNOSIS — Z515 Encounter for palliative care: Secondary | ICD-10-CM | POA: Diagnosis not present

## 2021-02-14 DIAGNOSIS — M170 Bilateral primary osteoarthritis of knee: Secondary | ICD-10-CM | POA: Diagnosis not present

## 2021-02-14 DIAGNOSIS — G1220 Motor neuron disease, unspecified: Secondary | ICD-10-CM | POA: Diagnosis not present

## 2021-02-14 DIAGNOSIS — M1712 Unilateral primary osteoarthritis, left knee: Secondary | ICD-10-CM | POA: Diagnosis not present

## 2021-02-14 DIAGNOSIS — M1711 Unilateral primary osteoarthritis, right knee: Secondary | ICD-10-CM | POA: Diagnosis not present

## 2021-02-21 DIAGNOSIS — M170 Bilateral primary osteoarthritis of knee: Secondary | ICD-10-CM | POA: Diagnosis not present

## 2021-02-28 DIAGNOSIS — M170 Bilateral primary osteoarthritis of knee: Secondary | ICD-10-CM | POA: Diagnosis not present

## 2021-04-20 DIAGNOSIS — H2513 Age-related nuclear cataract, bilateral: Secondary | ICD-10-CM | POA: Diagnosis not present

## 2021-05-03 DIAGNOSIS — B351 Tinea unguium: Secondary | ICD-10-CM | POA: Diagnosis not present

## 2021-05-03 DIAGNOSIS — E119 Type 2 diabetes mellitus without complications: Secondary | ICD-10-CM | POA: Diagnosis not present

## 2021-05-03 DIAGNOSIS — M79671 Pain in right foot: Secondary | ICD-10-CM | POA: Diagnosis not present

## 2021-05-03 DIAGNOSIS — M79672 Pain in left foot: Secondary | ICD-10-CM | POA: Diagnosis not present

## 2021-05-15 ENCOUNTER — Emergency Department (HOSPITAL_COMMUNITY)
Admission: EM | Admit: 2021-05-15 | Discharge: 2021-05-15 | Disposition: A | Discharge status: Home / Self Care | Attending: Emergency Medicine | Admitting: Emergency Medicine

## 2021-05-15 ENCOUNTER — Other Ambulatory Visit: Payer: Self-pay

## 2021-05-15 ENCOUNTER — Emergency Department (HOSPITAL_COMMUNITY)

## 2021-05-15 DIAGNOSIS — W1839XA Other fall on same level, initial encounter: Secondary | ICD-10-CM | POA: Diagnosis not present

## 2021-05-15 DIAGNOSIS — Y92002 Bathroom of unspecified non-institutional (private) residence single-family (private) house as the place of occurrence of the external cause: Secondary | ICD-10-CM | POA: Insufficient documentation

## 2021-05-15 DIAGNOSIS — S0181XA Laceration without foreign body of other part of head, initial encounter: Secondary | ICD-10-CM | POA: Diagnosis not present

## 2021-05-15 DIAGNOSIS — Z23 Encounter for immunization: Secondary | ICD-10-CM | POA: Diagnosis not present

## 2021-05-15 DIAGNOSIS — E1142 Type 2 diabetes mellitus with diabetic polyneuropathy: Secondary | ICD-10-CM | POA: Diagnosis not present

## 2021-05-15 DIAGNOSIS — S50312A Abrasion of left elbow, initial encounter: Secondary | ICD-10-CM | POA: Diagnosis not present

## 2021-05-15 DIAGNOSIS — S022XXA Fracture of nasal bones, initial encounter for closed fracture: Secondary | ICD-10-CM | POA: Diagnosis not present

## 2021-05-15 DIAGNOSIS — Z7982 Long term (current) use of aspirin: Secondary | ICD-10-CM | POA: Insufficient documentation

## 2021-05-15 DIAGNOSIS — I10 Essential (primary) hypertension: Secondary | ICD-10-CM | POA: Diagnosis not present

## 2021-05-15 DIAGNOSIS — S0992XA Unspecified injury of nose, initial encounter: Secondary | ICD-10-CM | POA: Diagnosis present

## 2021-05-15 DIAGNOSIS — I5031 Acute diastolic (congestive) heart failure: Secondary | ICD-10-CM | POA: Insufficient documentation

## 2021-05-15 DIAGNOSIS — I110 Hypertensive heart disease with heart failure: Secondary | ICD-10-CM | POA: Insufficient documentation

## 2021-05-15 DIAGNOSIS — Z79899 Other long term (current) drug therapy: Secondary | ICD-10-CM | POA: Insufficient documentation

## 2021-05-15 DIAGNOSIS — W19XXXA Unspecified fall, initial encounter: Secondary | ICD-10-CM | POA: Diagnosis not present

## 2021-05-15 DIAGNOSIS — R69 Illness, unspecified: Secondary | ICD-10-CM | POA: Diagnosis not present

## 2021-05-15 DIAGNOSIS — S0101XA Laceration without foreign body of scalp, initial encounter: Secondary | ICD-10-CM | POA: Diagnosis not present

## 2021-05-15 MED ORDER — TETANUS-DIPHTH-ACELL PERTUSSIS 5-2.5-18.5 LF-MCG/0.5 IM SUSY
0.5000 mL | PREFILLED_SYRINGE | Freq: Once | INTRAMUSCULAR | Status: AC
  Administered 2021-05-15: 08:00:00 0.5 mL via INTRAMUSCULAR
  Filled 2021-05-15: qty 0.5

## 2021-05-15 MED ORDER — ACETAMINOPHEN 500 MG PO TABS
1000.0000 mg | ORAL_TABLET | Freq: Once | ORAL | Status: AC
  Administered 2021-05-15: 1000 mg via ORAL
  Filled 2021-05-15: qty 2

## 2021-05-15 MED ORDER — LIDOCAINE-EPINEPHRINE (PF) 2 %-1:200000 IJ SOLN
10.0000 mL | Freq: Once | INTRAMUSCULAR | Status: AC
  Administered 2021-05-15: 10 mL
  Filled 2021-05-15: qty 20

## 2021-05-15 MED ORDER — TRAMADOL HCL 50 MG PO TABS
50.0000 mg | ORAL_TABLET | Freq: Once | ORAL | Status: AC
  Administered 2021-05-15: 50 mg via ORAL
  Filled 2021-05-15: qty 1

## 2021-05-15 NOTE — ED Triage Notes (Signed)
Pt from [facility], found face down by FD in the bathroom floor this morning. Caregiver heard patient fall and immediately went to her. No LOC. Pt denies dizziness. Lac to top of forehead, pain in nose and mouth, and skin tear to L elbow. Pt is at her baseline-has some speech difficulties at baseline. C collar placed by EMS. No blood thinners.

## 2021-05-15 NOTE — ED Provider Notes (Signed)
[facility] EMERGENCY DEPARTMENT Provider Note   CSN: [id number] Arrival date & time: 05/15/21  0715     History Chief Complaint  Patient presents with   [name] [name] is a 70 y.o. female presenting from home with a fall and head injury.  She has 24 hour caregiver at independent living.  She had a mechanical fall, reportedly heard a fall in the other room by caregiver, and was found to have a laceration to the forehead, and abrasion to left elbow.  Pt at baseline mental status.  She has some dysarthria at baseline.  Not on A/C.  HPI     Past Medical History:  Diagnosis Date   Acute kidney failure (HCC)    hx of    Arthritis    fingers,right shoulder   Bradycardia    Bulge of cervical disc without myelopathy    C4 -- C7  and stenosis   CHF (congestive heart failure) (HCC)    acute diastolic ( congestive ) heart failure)    Chronic lumbar radiculopathy    L5- S1  right leg   Constipation    Depression    Diverticulosis large intestine w/o perforation or abscess w/bleeding    Dizziness    Dry eye syndrome of bilateral lacrimal glands    Frequency of urination    GERD (gastroesophageal reflux disease)    Hard of hearing    History of kidney stones 05/12/15   surgery    HTN (hypertension)    Hyperlipidemia    Hypokalemia    Morbid obesity due to excess calories (HCC)    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[place])    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HCC)    Numbness and tingling in hands    OSA (obstructive sleep apnea)    moderate OSA per study 11-22-2007--  refused CPAP but used oxygen for 6 months at night,  states due to wt loss stopped using oxygen   Polyneuropathy, diabetic (HCC)    WALKS W/ CANE FOR BALANCE   Progressive supranuclear ophthalmoplegia (HCC)    Progressive supranuclear palsy (HCC)    Pyonephrosis    Pyonephrosis    Radiculopathy of lumbar region    Respiratory failure (HCC)    hx of acute respiratory  failure wtih hypoxia    Right ureteral stone    Severe sepsis with septic shock (CODE) (HCC)    Shortness of breath dyspnea    Spinal stenosis, cervical region    SUI (stress urinary incontinence, female)    Tubulo-interstitial nephritis    Type 2 diabetes mellitus (HCC)    Type II - Diet controlled   Urinary incontinence    Urinary tract infection    Wears glasses    Wound of gluteal cleft    Left    Patient Active Problem List   Diagnosis Date Noted   Palliative care encounter 03/09/2019   Pressure injury of skin 03/06/2019   Sepsis ([place]) 03/05/2019   Cellulitis of buttock, left 03/05/2019   Bradycardia 04/01/2018   UTI (urinary tract infection) 04/01/2018   ARF (acute renal failure) ([place]) 04/01/2018   Right ureteral stone 61/60/7371   Acute diastolic heart failure ([place]) 04/01/2018   Acute respiratory failure with hypoxia ([place])    Pyelonephritis    Severe sepsis ([place])    Pyohydronephrosis 03/29/2018   Septic shock ([place]) 03/29/2018   Hypokalemia 03/28/2018   Severe sepsis with septic shock ([place]) 03/28/2018   Elevated lactic acid level  03/28/2018   Elevated troponin 03/28/2018   PSP (progressive supranuclear palsy) ([place]) 02/18/2018   Acute serous otitis media of right ear 01/13/2018   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/13/2018   Chronic cough 11/19/2017   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   Cervical stenosis of spinal canal 03/23/2016   OSA on CPAP 12/14/2015   Dizziness and giddiness 11/24/2013   Diabetes type 2, uncontrolled ([place]) 11/24/2013   Severe obesity (BMI >= 40) ([place]) 11/24/2013   Lumbar radiculopathy 11/24/2013   Osteoporosis 10/14/2012   Allergic rhinitis 09/08/2010   Type II or unspecified type diabetes mellitus without mention of complication, not stated as uncontrolled 08/23/2010   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[place]) 07/28/2010   Vitamin D deficiency 07/28/2010   Benign essential hypertension 07/26/2010    Depressive disorder, not elsewhere classified 09/23/2009   Disorder of bone and cartilage 09/23/2009   Contact dermatitis and other eczema, due to unspecified cause 74/06/1447   Diastolic dysfunction 18/56/3149    Past Surgical History:  Procedure Laterality Date   ANTERIOR CERVICAL DECOMP/DISCECTOMY FUSION N/A 03/23/2016   Procedure: Cervical Four-Five, Cervical Five-Six, Cervical Six-Seven Anterior cervical decompression/diskectomy/fusion;  Surgeon: Leeroy Cha, MD;  Location: [facility];  Service: Neurosurgery;  Laterality: N/A;  C4-5 C5-6 C6-7 Anterior cervical decompression/diskectomy/fusion   CARDIOVASCULAR STRESS TEST  09-30-2007     normal Adenosine study/  no ischemia /  normal LV function and wall motion , ef 82%   COLONOSCOPY     CYSTOSCOPY WITH RETROGRADE PYELOGRAM, URETEROSCOPY AND STENT PLACEMENT Right 05/12/2015   Procedure: CYSTOSCOPY WITH RETROGRADE PYELOGRAM, URETEROSCOPY,STONE EXTRACTION AND STENT PLACEMENT;  Surgeon: Franchot Gallo, MD;  Location: [facility];  Service: Urology;  Laterality: Right;   CYSTOSCOPY WITH RETROGRADE PYELOGRAM, URETEROSCOPY AND STENT PLACEMENT Right 04/25/2018   Procedure: CYSTOSCOPY WITH RETROGRADE PYELOGRAM, URETEROSCOPY AND STENT EXCHANGE;  Surgeon: Alexis Frock, MD;  Location: [facility];  Service: Urology;  Laterality: Right;   CYSTOSCOPY WITH STENT PLACEMENT Right 03/28/2018   Procedure: CYSTOSCOPY WITH STENT PLACEMENT retrograde pylegram;  Surgeon: Alexis Frock, MD;  Location: [facility];  Service: Urology;  Laterality: Right;   EXTRACORPOREAL SHOCK WAVE LITHOTRIPSY Right 08-18-2012   HOLMIUM LASER APPLICATION Right 05/13/6377   Procedure: HOLMIUM LASER APPLICATION;  Surgeon: Franchot Gallo, MD;  Location: [facility];  Service: Urology;  Laterality: Right;   HOLMIUM LASER APPLICATION Right 5/88/5027   Procedure: HOLMIUM LASER APPLICATION;  Surgeon: Alexis Frock, MD;  Location: [facility];  Service: Urology;   Laterality: Right;   ORIF HUMERUS FRACTURE Right 07/20/2016   Procedure: OPEN REDUCTION INTERNAL FIXATION (ORIF) PROXIMAL HUMERUS FRACTURE VS REVERSE TOTAL SHOULDER ARTHROPLASTY;  Surgeon: Netta Cedars, MD;  Location: [facility];  Service: Orthopedics;  Laterality: Right;   SHOULDER ARTHROSCOPY Right 07/2016   TRANSTHORACIC ECHOCARDIOGRAM  09-23-2007   pseudonormal LV filling pattern,  ef 65-70%/  trivial AR/  mild LAE   TUBAL LIGATION  1985   WOUND DEBRIDEMENT Left 03/10/2019   Procedure: EXCISION AND DEBRIDEMENT LEFT PERINEAL WOUND;  Surgeon: Jovita Kussmaul, MD;  Location: [facility];  Service: General;  Laterality: Left;     OB History   No obstetric history on file.     Family History  Problem Relation Age of Onset   Heart Problems Father    Stroke Mother    Hypertension Other    Stroke Other    Breast cancer Sister    Healthy Son     Social History  Tobacco Use   Smoking status: Never   Smokeless tobacco: Never  Vaping Use   Vaping Use: Never used  Substance Use Topics   Alcohol use: Yes    Alcohol/week: 0.0 standard drinks    Comment: 3- 4 times a year   Drug use: No    Home Medications Prior to Admission medications   Medication Sig Start Date End Date Taking? Authorizing Provider  acetaminophen (TYLENOL) 325 MG tablet Take 650 mg by mouth 2 (two) times daily.     [provider]  aspirin 81 MG chewable tablet Chew 81 mg by mouth daily.    [provider]  azelastine (ASTELIN) 0.1 % nasal spray Place 2 sprays into both nostrils 2 (two) times daily.     [provider]  carbidopa-levodopa (SINEMET IR) 25-100 MG tablet Take 2 tabs in the morning, 1 in the afternoon and 1 in the evening 07/14/20   Tat, Wells Guiles S, DO  Cholecalciferol (VITAMIN D PO) Take 5,000 Units by mouth daily.    [provider]  cyanocobalamin 500 MCG tablet Take 500 mcg by mouth daily.    [provider]  diclofenac sodium (VOLTAREN) 1 % GEL Apply 4 g topically  2 (two) times daily.  12/16/18   [provider]  escitalopram (LEXAPRO) 10 MG tablet Take 15 mg by mouth daily.     [provider]  fenofibrate 160 MG tablet Take 160 mg by mouth every morning.     [provider]  fluticasone (FLONASE) 50 MCG/ACT nasal spray Place 2 sprays into both nostrils daily.  12/23/14   [provider]  gabapentin (NEURONTIN) 100 MG capsule Take 300 mg by mouth at bedtime. 12/21/19   [provider]  HYDROcodone-acetaminophen (NORCO/VICODIN) 5-325 MG tablet Take 2 tablets by mouth every 4 (four) hours as needed. 07/01/20   Volanda Napoleon, PA-C  loratadine (CLARITIN) 10 MG tablet Take 10 mg by mouth daily.     [provider]  losartan-hydrochlorothiazide (HYZAAR) 100-12.5 MG tablet Take 1 tablet by mouth daily. 12/17/19   [provider]  Melatonin 5 MG CAPS Take 10 mg by mouth at bedtime as needed.     [provider]  Menthol, Topical Analgesic, (BIOFREEZE EX) Apply topically.    [provider]  montelukast (SINGULAIR) 10 MG tablet Take 10 mg by mouth every evening.     [provider]  nystatin cream (MYCOSTATIN) Apply 1 application topically 2 (two) times daily.     [provider]  Respiratory Therapy Supplies (FLUTTER) DEVI Use as directed 08/27/19   Icard, Octavio Graves, DO  traMADol (ULTRAM) 50 MG tablet Take 50 mg by mouth every 6 (six) hours as needed. 05/28/19   [provider]    Allergies    Aleve [naproxen], Protonix [pantoprazole sodium], and Zocor [simvastatin]  Review of Systems   Review of Systems  Constitutional:  Negative for chills and fever.  Respiratory:  Negative for cough and shortness of breath.   Cardiovascular:  Negative for chest pain and palpitations.  Gastrointestinal:  Negative for abdominal pain and vomiting.  Musculoskeletal:  Positive for arthralgias. Negative for back pain, neck pain and neck stiffness.  Skin:  Positive for rash  and wound.  Neurological:  Negative for syncope and headaches.  All other systems reviewed and are negative.  Physical Exam Updated Vital Signs BP 138/90 (BP Location: Right Arm)   Pulse 88   Temp 98.5 F (36.9 C) (Oral)  Resp (!) 21   SpO2 95%   Physical Exam Constitutional:      General: She is not in acute distress. HENT:     Head: Normocephalic.     Comments: Stellate laceration to mid forehead,no active bleeding Swelling and tenderness overlying bridge of nose No septal hematoma noted Eyes:     Conjunctiva/sclera: Conjunctivae normal.     Pupils: Pupils are equal, round, and reactive to light.  Cardiovascular:     Rate and Rhythm: Normal rate and regular rhythm.  Pulmonary:     Effort: Pulmonary effort is normal. No respiratory distress.  Abdominal:     General: There is no distension.     Tenderness: There is no abdominal tenderness.  Musculoskeletal:     Comments: Full ROM of bilateral hips and LE, no tenderness, no pelvic instability No other injuries noted  Skin:    General: Skin is warm and dry.     Comments: Superficial abrasion to left elbow, no active bleed  Neurological:     General: No focal deficit present.     Mental Status: She is alert and oriented to person, place, and time. Mental status is at baseline.    ED Results / Procedures / Treatments   Labs (all labs ordered are listed, but only abnormal results are displayed) Labs Reviewed - No data to display  EKG EKG Interpretation  Date/Time:  Monday May 15 2021 07:21:52 EDT Ventricular Rate:  78 PR Interval:  218 QRS Duration: 100 QT Interval:  379 QTC Calculation: 432 R Axis:   -71 Text Interpretation: Sinus rhythm Borderline prolonged PR interval Abnormal R-wave progression, late transition Inferior infarct, old Confirmed by Octaviano Glow [phone]) on 05/15/2021 7:47:59 AM  Radiology CT Head Wo Contrast  Result Date: 05/15/2021 CLINICAL DATA:  Status post fall with forehead laceration.  EXAM: CT HEAD WITHOUT CONTRAST TECHNIQUE: Contiguous axial images were obtained from the base of the skull through the vertex without intravenous contrast. COMPARISON:  07/01/2020 FINDINGS: Brain: No evidence of acute infarction, hemorrhage, hydrocephalus, extra-axial collection or mass lesion/mass effect. Dilated perivascular space versus lacunar infarct noted in the left basal ganglia, image 16/3. Vascular: No hyperdense vessel or unexpected calcification. Skull: Normal. Negative for fracture or focal lesion. Sinuses/Orbits: Right mastoid air cell opacification. Other: Age indeterminate bilateral nasal bone fractures, image 59/5. IMPRESSION: 1. No acute intracranial abnormalities. 2. Age indeterminate bilateral nasal bone fractures. 3. Right mastoid air cell effusion. Electronically Signed   By: Kerby Moors M.D.   On: 05/15/2021 08:29    Procedures .Marland KitchenLaceration Repair  Date/Time: 05/15/2021 4:36 PM Performed by: Wyvonnia Dusky, MD Authorized by: Wyvonnia Dusky, MD   Consent:    Consent obtained:  Verbal   Consent given by:  Guardian   Risks, benefits, and alternatives were discussed: yes     Risks discussed:  Poor cosmetic result and infection Universal protocol:    Procedure explained and questions answered to patient or proxy's satisfaction: yes     Site/side marked: yes     Immediately prior to procedure, a time out was called: yes     Patient identity confirmed:  Arm band Anesthesia:    Anesthesia method:  Local infiltration   Local anesthetic:  Lidocaine 1% WITH epi Laceration details:    Location:  Scalp   Scalp location:  Frontal   Length (cm):  4   Depth (mm):  3 Pre-procedure details:    Preparation:  Patient was prepped and draped in usual sterile fashion  Exploration:    Limited defect created (wound extended): no     Hemostasis achieved with:  Direct pressure   Imaging outcome: foreign body not noted     Wound exploration: wound explored through full range of  motion     Contaminated: no   Treatment:    Area cleansed with:  Saline   Amount of cleaning:  Standard   Irrigation method:  Pressure wash   Debridement:  None Skin repair:    Repair method:  Staples and sutures   Suture size:  6-0 and 3-0   Suture material:  Prolene   Suture technique:  Simple interrupted   Number of sutures:  7 Approximation:    Approximation:  Close Repair type:    Repair type:  Intermediate Post-procedure details:    Dressing:  Non-adherent dressing   Procedure completion:  Tolerated well, no immediate complications   Medications Ordered in ED Medications  Tdap (BOOSTRIX) injection 0.5 mL (0.5 mLs Intramuscular Given 05/15/21 0758)  lidocaine-EPINEPHrine (XYLOCAINE W/EPI) 2 %-1:200000 (PF) injection 10 mL (10 mLs Infiltration Given by Other 05/15/21 0759)  acetaminophen (TYLENOL) tablet 1,000 mg (1,000 mg Oral Given 05/15/21 0924)  traMADol (ULTRAM) tablet 50 mg (50 mg Oral Given 05/15/21 9485)    ED Course  I have reviewed the triage vital signs and the nursing notes.  Pertinent labs & imaging results that were available during my care of the patient were reviewed by me and considered in my medical decision making (see chart for details).  [address] ordered and reviewed - nasal bone fractures noted Tdap Will need sutures to close forehead wound  Doubt elbow fx Doubt pelvic or LE fx Doubt spinal fx per exam  Pt otherwise appears to be at baseline per report  Laceration cleaned and repaired as best as possible - difficult stellate laceration.  I explained this will very likely scar, and they are aware of this.  However we were able to close the wound and achieve hemostasis here.  Nasal bone fractures discussed - they can follow up with trauma facial surgeon [facility] in 1-2 weeks for this.  There is likely nothing operative to be done here.  Pain medications given in ED.  Clinical Course as of 05/15/21 1639  Mon May 15, 2021  4627 IMPRESSION: 1.  No acute intracranial abnormalities. 2. Age indeterminate bilateral nasal bone fractures. 3. Right mastoid air cell effusion. [MT]  [phone] No septal hematoma, no gross deformity of the nose. [MT]    Clinical Course User Index [MT] Karilyn Wind, Carola Rhine, MD    Final Clinical Impression(s) / ED Diagnoses Final diagnoses:  Fall, initial encounter  Closed fracture of nasal bone, initial encounter  Laceration of forehead, initial encounter    Rx / DC Orders ED Discharge Orders     None        Jaeliana Lococo, Carola Rhine, MD 05/15/21 [phone]

## 2021-05-15 NOTE — ED Notes (Signed)
Patient transported to CT 

## 2021-05-15 NOTE — Discharge Instructions (Addendum)
Please keep the stitches dry for the next 2 days.  You can keep a bandage for that time.  After that, he can take down the bandages.  The wound should stop bleeding.  You can shower, but do not soak the area in water.  Do not put ointment or bacitracin or anything else on it.  The stitches need to be removed in 5 to 10 days.  This can be done at her doctor's office, an urgent care, or the ER.  The scrape on the left elbow should heal on its own.  There are broken bones in her nose.  These should gradually heal over the next 4 to 6 weeks.  She can use ice packs as needed, 10 minutes at a time, if it helps.  Otherwise continue giving her her Tylenol and tramadol as needed for pain at home.  If she continues having pain out of the nose, or difficulty breathing after 2 weeks, she can follow-up with a plastic surgeon/ facial specialist in 2 weeks.

## 2021-06-13 DIAGNOSIS — H2513 Age-related nuclear cataract, bilateral: Secondary | ICD-10-CM | POA: Diagnosis not present

## 2021-06-13 DIAGNOSIS — H25813 Combined forms of age-related cataract, bilateral: Secondary | ICD-10-CM | POA: Diagnosis not present

## 2021-06-20 ENCOUNTER — Other Ambulatory Visit: Payer: Self-pay | Admitting: Family Medicine

## 2021-06-20 DIAGNOSIS — E2839 Other primary ovarian failure: Secondary | ICD-10-CM

## 2021-06-20 DIAGNOSIS — E559 Vitamin D deficiency, unspecified: Secondary | ICD-10-CM

## 2021-07-18 DIAGNOSIS — H60332 Swimmer's ear, left ear: Secondary | ICD-10-CM | POA: Diagnosis not present

## 2021-07-18 DIAGNOSIS — H6123 Impacted cerumen, bilateral: Secondary | ICD-10-CM | POA: Diagnosis not present

## 2021-08-09 DIAGNOSIS — M79672 Pain in left foot: Secondary | ICD-10-CM | POA: Diagnosis not present

## 2021-08-09 DIAGNOSIS — B351 Tinea unguium: Secondary | ICD-10-CM | POA: Diagnosis not present

## 2021-08-09 DIAGNOSIS — E119 Type 2 diabetes mellitus without complications: Secondary | ICD-10-CM | POA: Diagnosis not present

## 2021-08-09 DIAGNOSIS — M79671 Pain in right foot: Secondary | ICD-10-CM | POA: Diagnosis not present

## 2021-08-11 ENCOUNTER — Emergency Department (HOSPITAL_BASED_OUTPATIENT_CLINIC_OR_DEPARTMENT_OTHER)
Admission: EM | Admit: 2021-08-11 | Discharge: 2021-08-12 | Disposition: A | Discharge status: Home / Self Care | Attending: Emergency Medicine | Admitting: Emergency Medicine

## 2021-08-11 ENCOUNTER — Emergency Department (HOSPITAL_BASED_OUTPATIENT_CLINIC_OR_DEPARTMENT_OTHER)

## 2021-08-11 ENCOUNTER — Encounter (HOSPITAL_BASED_OUTPATIENT_CLINIC_OR_DEPARTMENT_OTHER): Payer: Self-pay | Admitting: *Deleted

## 2021-08-11 ENCOUNTER — Other Ambulatory Visit: Payer: Self-pay

## 2021-08-11 DIAGNOSIS — E1142 Type 2 diabetes mellitus with diabetic polyneuropathy: Secondary | ICD-10-CM | POA: Diagnosis not present

## 2021-08-11 DIAGNOSIS — I5031 Acute diastolic (congestive) heart failure: Secondary | ICD-10-CM | POA: Insufficient documentation

## 2021-08-11 DIAGNOSIS — Y9389 Activity, other specified: Secondary | ICD-10-CM | POA: Insufficient documentation

## 2021-08-11 DIAGNOSIS — R0902 Hypoxemia: Secondary | ICD-10-CM | POA: Diagnosis not present

## 2021-08-11 DIAGNOSIS — W19XXXA Unspecified fall, initial encounter: Secondary | ICD-10-CM | POA: Diagnosis not present

## 2021-08-11 DIAGNOSIS — I110 Hypertensive heart disease with heart failure: Secondary | ICD-10-CM | POA: Diagnosis not present

## 2021-08-11 DIAGNOSIS — Z79899 Other long term (current) drug therapy: Secondary | ICD-10-CM | POA: Insufficient documentation

## 2021-08-11 DIAGNOSIS — R404 Transient alteration of awareness: Secondary | ICD-10-CM | POA: Diagnosis not present

## 2021-08-11 DIAGNOSIS — X58XXXA Exposure to other specified factors, initial encounter: Secondary | ICD-10-CM | POA: Insufficient documentation

## 2021-08-11 DIAGNOSIS — S4991XA Unspecified injury of right shoulder and upper arm, initial encounter: Secondary | ICD-10-CM

## 2021-08-11 DIAGNOSIS — M25519 Pain in unspecified shoulder: Secondary | ICD-10-CM | POA: Diagnosis not present

## 2021-08-11 DIAGNOSIS — Z7982 Long term (current) use of aspirin: Secondary | ICD-10-CM | POA: Diagnosis not present

## 2021-08-11 DIAGNOSIS — M25511 Pain in right shoulder: Secondary | ICD-10-CM | POA: Diagnosis not present

## 2021-08-11 NOTE — Discharge Instructions (Addendum)
X-ray of the right shoulder without any bony abnormalities or dislocation.  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  No evidence of any head injury.  Stable for discharge back to nursing facility.

## 2021-08-11 NOTE — ED Notes (Signed)
Patient transported to X-ray 

## 2021-08-11 NOTE — ED Triage Notes (Addendum)
Pt arrived ems w c/o rt shoulder pain after turning wheelchair backwards  denies hitting head denies loc  per ems pt at baseline   pt unable to sigh mse  pt from [place]

## 2021-08-11 NOTE — ED Provider Notes (Signed)
[facility] EMERGENCY DEPARTMENT Provider Note   CSN: [id number] Arrival date & time: 08/11/21  1622     History Chief Complaint  Patient presents with   Shoulder Injury    [name] [name] is a 70 y.o. female.  Patient brought in by EMS from [facility].  Patient supposedly had right shoulder pain after turning wheelchair backwards but did not hit her head per EMS she is at baseline.  Patient known to have significant Parkinson disorder and moves her body constantly.  Also of note EMS noted no abrasions or bruising's or dislocation to the right arm.  No evidence of any bruising to the head.      Past Medical History:  Diagnosis Date   Acute kidney failure (HCC)    hx of    Arthritis    fingers,right shoulder   Bradycardia    Bulge of cervical disc without myelopathy    C4 -- C7  and stenosis   CHF (congestive heart failure) (HCC)    acute diastolic ( congestive ) heart failure)    Chronic lumbar radiculopathy    L5- S1  right leg   Constipation    Depression    Diverticulosis large intestine w/o perforation or abscess w/bleeding    Dizziness    Dry eye syndrome of bilateral lacrimal glands    Frequency of urination    GERD (gastroesophageal reflux disease)    Hard of hearing    History of kidney stones 05/12/15   surgery    HTN (hypertension)    Hyperlipidemia    Hypokalemia    Morbid obesity due to excess calories (HCC)    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HCC)    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HCC)    Numbness and tingling in hands    OSA (obstructive sleep apnea)    moderate OSA per study 11-22-2007--  refused CPAP but used oxygen for 6 months at night,  states due to wt loss stopped using oxygen   Polyneuropathy, diabetic (HCC)    WALKS W/ CANE FOR BALANCE   Progressive supranuclear ophthalmoplegia (HCC)    Progressive supranuclear palsy (HCC)    Pyonephrosis    Pyonephrosis    Radiculopathy of lumbar region    Respiratory  failure (HCC)    hx of acute respiratory failure wtih hypoxia    Right ureteral stone    Severe sepsis with septic shock (CODE) (HCC)    Shortness of breath dyspnea    Spinal stenosis, cervical region    SUI (stress urinary incontinence, female)    Tubulo-interstitial nephritis    Type 2 diabetes mellitus (HCC)    Type II - Diet controlled   Urinary incontinence    Urinary tract infection    Wears glasses    Wound of gluteal cleft    Left    Patient Active Problem List   Diagnosis Date Noted   Palliative care encounter 03/09/2019   Pressure injury of skin 03/06/2019   Sepsis ([place]) 03/05/2019   Cellulitis of buttock, left 03/05/2019   Bradycardia 04/01/2018   UTI (urinary tract infection) 04/01/2018   ARF (acute renal failure) ([place]) 04/01/2018   Right ureteral stone 25/03/3975   Acute diastolic heart failure ([place]) 04/01/2018   Acute respiratory failure with hypoxia ([place])    Pyelonephritis    Severe sepsis ([place])    Pyohydronephrosis 03/29/2018   Septic shock ([place]) 03/29/2018   Hypokalemia 03/28/2018   Severe sepsis with septic shock ([place]) 03/28/2018  Elevated lactic acid level 03/28/2018   Elevated troponin 03/28/2018   PSP (progressive supranuclear palsy) ([place]) 02/18/2018   Acute serous otitis media of right ear 01/13/2018   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/13/2018   Chronic cough 11/19/2017   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   Cervical stenosis of spinal canal 03/23/2016   OSA on CPAP 12/14/2015   Dizziness and giddiness 11/24/2013   Diabetes type 2, uncontrolled ([place]) 11/24/2013   Severe obesity (BMI >= 40) ([place]) 11/24/2013   Lumbar radiculopathy 11/24/2013   Osteoporosis 10/14/2012   Allergic rhinitis 09/08/2010   Type II or unspecified type diabetes mellitus without mention of complication, not stated as uncontrolled 08/23/2010   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[place]) 07/28/2010   Vitamin D deficiency 07/28/2010    Benign essential hypertension 07/26/2010   Depressive disorder, not elsewhere classified 09/23/2009   Disorder of bone and cartilage 09/23/2009   Contact dermatitis and other eczema, due to unspecified cause 26/20/3559   Diastolic dysfunction 74/16/3845    Past Surgical History:  Procedure Laterality Date   ANTERIOR CERVICAL DECOMP/DISCECTOMY FUSION N/A 03/23/2016   Procedure: Cervical Four-Five, Cervical Five-Six, Cervical Six-Seven Anterior cervical decompression/diskectomy/fusion;  Surgeon: Leeroy Cha, MD;  Location: [facility];  Service: Neurosurgery;  Laterality: N/A;  C4-5 C5-6 C6-7 Anterior cervical decompression/diskectomy/fusion   CARDIOVASCULAR STRESS TEST  09-30-2007     normal Adenosine study/  no ischemia /  normal LV function and wall motion , ef 82%   COLONOSCOPY     CYSTOSCOPY WITH RETROGRADE PYELOGRAM, URETEROSCOPY AND STENT PLACEMENT Right 05/12/2015   Procedure: CYSTOSCOPY WITH RETROGRADE PYELOGRAM, URETEROSCOPY,STONE EXTRACTION AND STENT PLACEMENT;  Surgeon: Franchot Gallo, MD;  Location: [facility];  Service: Urology;  Laterality: Right;   CYSTOSCOPY WITH RETROGRADE PYELOGRAM, URETEROSCOPY AND STENT PLACEMENT Right 04/25/2018   Procedure: CYSTOSCOPY WITH RETROGRADE PYELOGRAM, URETEROSCOPY AND STENT EXCHANGE;  Surgeon: Alexis Frock, MD;  Location: [facility];  Service: Urology;  Laterality: Right;   CYSTOSCOPY WITH STENT PLACEMENT Right 03/28/2018   Procedure: CYSTOSCOPY WITH STENT PLACEMENT retrograde pylegram;  Surgeon: Alexis Frock, MD;  Location: [facility];  Service: Urology;  Laterality: Right;   EXTRACORPOREAL SHOCK WAVE LITHOTRIPSY Right 08-18-2012   HOLMIUM LASER APPLICATION Right 3/64/6803   Procedure: HOLMIUM LASER APPLICATION;  Surgeon: Franchot Gallo, MD;  Location: [facility];  Service: Urology;  Laterality: Right;   HOLMIUM LASER APPLICATION Right 12/24/2480   Procedure: HOLMIUM LASER APPLICATION;  Surgeon: Alexis Frock, MD;  Location: [facility];  Service: Urology;  Laterality: Right;   ORIF HUMERUS FRACTURE Right 07/20/2016   Procedure: OPEN REDUCTION INTERNAL FIXATION (ORIF) PROXIMAL HUMERUS FRACTURE VS REVERSE TOTAL SHOULDER ARTHROPLASTY;  Surgeon: Netta Cedars, MD;  Location: [facility];  Service: Orthopedics;  Laterality: Right;   SHOULDER ARTHROSCOPY Right 07/2016   TRANSTHORACIC ECHOCARDIOGRAM  09-23-2007   pseudonormal LV filling pattern,  ef 65-70%/  trivial AR/  mild LAE   TUBAL LIGATION  1985   WOUND DEBRIDEMENT Left 03/10/2019   Procedure: EXCISION AND DEBRIDEMENT LEFT PERINEAL WOUND;  Surgeon: Jovita Kussmaul, MD;  Location: [facility];  Service: General;  Laterality: Left;     OB History   No obstetric history on file.     Family History  Problem Relation Age of Onset   Heart Problems Father    Stroke Mother    Hypertension Other    Stroke Other    Breast cancer Sister    Healthy Son  Social History   Tobacco Use   Smoking status: Never   Smokeless tobacco: Never  Vaping Use   Vaping Use: Never used  Substance Use Topics   Alcohol use: Yes    Alcohol/week: 0.0 standard drinks    Comment: 3- 4 times a year   Drug use: No    Home Medications Prior to Admission medications   Medication Sig Start Date End Date Taking? Authorizing Provider  acetaminophen (TYLENOL) 325 MG tablet Take 650 mg by mouth 2 (two) times daily.     [provider]  aspirin 81 MG chewable tablet Chew 81 mg by mouth daily.    [provider]  azelastine (ASTELIN) 0.1 % nasal spray Place 2 sprays into both nostrils 2 (two) times daily.     [provider]  carbidopa-levodopa (SINEMET IR) 25-100 MG tablet Take 2 tabs in the morning, 1 in the afternoon and 1 in the evening 07/14/20   Tat, Wells Guiles S, DO  Cholecalciferol (VITAMIN D PO) Take 5,000 Units by mouth daily.    [provider]  cyanocobalamin 500 MCG tablet Take 500 mcg by mouth daily.    [provider]   diclofenac sodium (VOLTAREN) 1 % GEL Apply 4 g topically 2 (two) times daily.  12/16/18   [provider]  escitalopram (LEXAPRO) 10 MG tablet Take 15 mg by mouth daily.     [provider]  fenofibrate 160 MG tablet Take 160 mg by mouth every morning.     [provider]  fluticasone (FLONASE) 50 MCG/ACT nasal spray Place 2 sprays into both nostrils daily.  12/23/14   [provider]  gabapentin (NEURONTIN) 100 MG capsule Take 300 mg by mouth at bedtime. 12/21/19   [provider]  HYDROcodone-acetaminophen (NORCO/VICODIN) 5-325 MG tablet Take 2 tablets by mouth every 4 (four) hours as needed. 07/01/20   Volanda Napoleon, PA-C  loratadine (CLARITIN) 10 MG tablet Take 10 mg by mouth daily.     [provider]  losartan-hydrochlorothiazide (HYZAAR) 100-12.5 MG tablet Take 1 tablet by mouth daily. 12/17/19   [provider]  Melatonin 5 MG CAPS Take 10 mg by mouth at bedtime as needed.     [provider]  Menthol, Topical Analgesic, (BIOFREEZE EX) Apply topically.    [provider]  montelukast (SINGULAIR) 10 MG tablet Take 10 mg by mouth every evening.     [provider]  nystatin cream (MYCOSTATIN) Apply 1 application topically 2 (two) times daily.     [provider]  Respiratory Therapy Supplies (FLUTTER) DEVI Use as directed 08/27/19   Icard, Octavio Graves, DO  traMADol (ULTRAM) 50 MG tablet Take 50 mg by mouth every 6 (six) hours as needed. 05/28/19   [provider]    Allergies    Aleve [naproxen], Protonix [pantoprazole sodium], and Zocor [simvastatin]  Review of Systems   Review of Systems  Unable to perform ROS: Dementia   Physical Exam Updated Vital Signs BP 137/81 (BP Location: Right Arm)   Pulse 94   Temp 98.1 F (36.7 C) (Oral)   Resp 20   Wt 69.9 kg   SpO2 96%   BMI 29.10 kg/m   Physical Exam Vitals and nursing note reviewed.  Constitutional:      General: She is  not in acute distress.    Appearance: Normal appearance. She is well-developed.  HENT:     Head: Normocephalic and atraumatic.     Comments: No evidence  of any head injury no abrasions no swelling no redness Eyes:     Extraocular Movements: Extraocular movements intact.     Conjunctiva/sclera: Conjunctivae normal.     Pupils: Pupils are equal, round, and reactive to light.  Cardiovascular:     Rate and Rhythm: Normal rate and regular rhythm.     Heart sounds: No murmur heard. Pulmonary:     Effort: Pulmonary effort is normal. No respiratory distress.     Breath sounds: Normal breath sounds.  Abdominal:     Palpations: Abdomen is soft.     Tenderness: There is no abdominal tenderness.  Musculoskeletal:        General: No tenderness or deformity. Normal range of motion.     Comments: The right shoulder area without deformity.  No bruising no abrasions.  Radial pulse distally 2+.  Patient with constant movement of the arm.  Cap refill intact the right shoulder does have a well-healed anterior scar.  Skin:    General: Skin is warm and dry.     Capillary Refill: Capillary refill takes less than 2 seconds.  Neurological:     Mental Status: She is alert. Mental status is at baseline.     Comments: Patient with constant movement of her upper extremities lower extremities    ED Results / Procedures / Treatments   Labs (all labs ordered are listed, but only abnormal results are displayed) Labs Reviewed - No data to display  EKG None  Radiology DG Shoulder Right  Result Date: 08/11/2021 CLINICAL DATA:  Fall.  Right shoulder pain.  Initial encounter. EXAM: RIGHT SHOULDER - 2+ VIEW COMPARISON:  None. FINDINGS: Right shoulder prosthesis is seen. No evidence of acute fracture or dislocation. Heterotopic soft tissue ossification is noted. Mild degenerative changes also seen involving the acromioclavicular joint. IMPRESSION: No acute findings. Right shoulder prosthesis. Electronically Signed    By: Marlaine Hind M.D.   On: 08/11/2021 17:36    Procedures Procedures   Medications Ordered in ED Medications - No data to display  ED Course  I have reviewed the triage vital signs and the nursing notes.  Pertinent labs & imaging results that were available during my care of the patient were reviewed by me and considered in my medical decision making (see chart for details).    MDM Rules/Calculators/A&P                           X-ray without any bony injuries.  Patient has a right shoulder prosthesis.  No acute findings associated with that. Final Clinical Impression(s) / ED Diagnoses Final diagnoses:  Injury of right shoulder, initial encounter    Rx / DC Orders ED Discharge Orders     None        Fredia Sorrow, MD 08/11/21 [phone]

## 2021-08-11 NOTE — ED Notes (Signed)
Pt. [facility].  Pt. Injured her R shoulder.  Pt. Follows orders to the best of her ability due to the parkinsons disorder and diagnosis she has.  Pt. Moves her body constantly.  Pt. Has no abrasions or bruises noted on her R arm.

## 2021-08-28 DIAGNOSIS — G20 Parkinson's disease: Secondary | ICD-10-CM | POA: Diagnosis not present

## 2021-09-18 DIAGNOSIS — I5030 Unspecified diastolic (congestive) heart failure: Secondary | ICD-10-CM | POA: Diagnosis not present

## 2021-09-18 DIAGNOSIS — Z79899 Other long term (current) drug therapy: Secondary | ICD-10-CM | POA: Diagnosis not present

## 2021-09-18 DIAGNOSIS — E785 Hyperlipidemia, unspecified: Secondary | ICD-10-CM | POA: Diagnosis not present

## 2021-09-18 DIAGNOSIS — G4733 Obstructive sleep apnea (adult) (pediatric): Secondary | ICD-10-CM | POA: Diagnosis not present

## 2021-09-18 DIAGNOSIS — I10 Essential (primary) hypertension: Secondary | ICD-10-CM | POA: Diagnosis not present

## 2021-09-18 DIAGNOSIS — H25812 Combined forms of age-related cataract, left eye: Secondary | ICD-10-CM | POA: Diagnosis not present

## 2021-09-18 DIAGNOSIS — H25813 Combined forms of age-related cataract, bilateral: Secondary | ICD-10-CM | POA: Diagnosis not present

## 2021-09-18 DIAGNOSIS — G20 Parkinson's disease: Secondary | ICD-10-CM | POA: Diagnosis not present

## 2021-09-18 DIAGNOSIS — E119 Type 2 diabetes mellitus without complications: Secondary | ICD-10-CM | POA: Diagnosis not present

## 2021-10-03 DIAGNOSIS — Z961 Presence of intraocular lens: Secondary | ICD-10-CM | POA: Diagnosis not present

## 2021-10-03 DIAGNOSIS — H25811 Combined forms of age-related cataract, right eye: Secondary | ICD-10-CM | POA: Diagnosis not present

## 2021-10-03 DIAGNOSIS — Z48810 Encounter for surgical aftercare following surgery on the sense organs: Secondary | ICD-10-CM | POA: Diagnosis not present

## 2022-03-15 DIAGNOSIS — T7849XD Other allergy, subsequent encounter: Secondary | ICD-10-CM | POA: Diagnosis not present

## 2022-03-15 DIAGNOSIS — M6281 Muscle weakness (generalized): Secondary | ICD-10-CM | POA: Diagnosis not present

## 2022-03-15 DIAGNOSIS — I10 Essential (primary) hypertension: Secondary | ICD-10-CM | POA: Diagnosis not present

## 2022-03-15 DIAGNOSIS — R252 Cramp and spasm: Secondary | ICD-10-CM | POA: Diagnosis not present

## 2022-03-15 DIAGNOSIS — G20 Parkinson's disease: Secondary | ICD-10-CM | POA: Diagnosis not present

## 2022-03-15 DIAGNOSIS — G894 Chronic pain syndrome: Secondary | ICD-10-CM | POA: Diagnosis not present

## 2022-03-17 ENCOUNTER — Emergency Department (HOSPITAL_BASED_OUTPATIENT_CLINIC_OR_DEPARTMENT_OTHER): Payer: PPO

## 2022-03-17 ENCOUNTER — Encounter (HOSPITAL_BASED_OUTPATIENT_CLINIC_OR_DEPARTMENT_OTHER): Payer: Self-pay | Admitting: Emergency Medicine

## 2022-03-17 ENCOUNTER — Emergency Department (HOSPITAL_BASED_OUTPATIENT_CLINIC_OR_DEPARTMENT_OTHER)
Admission: EM | Admit: 2022-03-17 | Discharge: 2022-03-18 | Disposition: A | Discharge status: Skilled Nursing Facility | Payer: PPO | Attending: Emergency Medicine | Admitting: Emergency Medicine

## 2022-03-17 ENCOUNTER — Other Ambulatory Visit: Payer: Self-pay

## 2022-03-17 DIAGNOSIS — Z79899 Other long term (current) drug therapy: Secondary | ICD-10-CM | POA: Diagnosis not present

## 2022-03-17 DIAGNOSIS — R6884 Jaw pain: Secondary | ICD-10-CM

## 2022-03-17 DIAGNOSIS — R404 Transient alteration of awareness: Secondary | ICD-10-CM | POA: Diagnosis not present

## 2022-03-17 DIAGNOSIS — S022XXA Fracture of nasal bones, initial encounter for closed fracture: Secondary | ICD-10-CM | POA: Diagnosis not present

## 2022-03-17 DIAGNOSIS — H6690 Otitis media, unspecified, unspecified ear: Secondary | ICD-10-CM | POA: Diagnosis not present

## 2022-03-17 DIAGNOSIS — Z7982 Long term (current) use of aspirin: Secondary | ICD-10-CM | POA: Insufficient documentation

## 2022-03-17 DIAGNOSIS — R519 Headache, unspecified: Secondary | ICD-10-CM | POA: Diagnosis not present

## 2022-03-17 DIAGNOSIS — M542 Cervicalgia: Secondary | ICD-10-CM | POA: Diagnosis not present

## 2022-03-17 HISTORY — DX: Parkinson's disease without dyskinesia, without mention of fluctuations: G20.A1

## 2022-03-17 HISTORY — DX: Parkinson's disease: G20

## 2022-03-17 LAB — TROPONIN I (HIGH SENSITIVITY)
Troponin I (High Sensitivity): 3 ng/L (ref <18)
Troponin I (High Sensitivity): 3 ng/L (ref <18)

## 2022-03-17 LAB — BASIC METABOLIC PANEL
Anion gap: 7 (ref 5–15)
BUN: 22 mg/dL (ref 8–23)
CO2: 25 mmol/L (ref 22–32)
Calcium: 9.8 mg/dL (ref 8.9–10.3)
Chloride: 108 mmol/L (ref 98–111)
Creatinine, Ser: 0.48 mg/dL (ref 0.44–1.00)
GFR, Estimated: >60 mL/min (ref >60)
Glucose, Bld: 95 mg/dL (ref 70–99)
Potassium: 4.1 mmol/L (ref 3.5–5.1)
Sodium: 140 mmol/L (ref 135–145)

## 2022-03-17 LAB — CBC WITH DIFFERENTIAL/PLATELET
Abs Immature Granulocytes: 0.01 10*3/uL (ref 0.00–0.07)
Basophils Absolute: 0 10*3/uL (ref 0.0–0.1)
Basophils Relative: 1 %
Eosinophils Absolute: 0.1 10*3/uL (ref 0.0–0.5)
Eosinophils Relative: 2 %
HCT: 39.2 % (ref 36.0–46.0)
Hemoglobin: 12.8 g/dL (ref 12.0–15.0)
Immature Granulocytes: 0 %
Lymphocytes Relative: 27 %
Lymphs Abs: 1.5 10*3/uL (ref 0.7–4.0)
MCH: 30.2 pg (ref 26.0–34.0)
MCHC: 32.7 g/dL (ref 30.0–36.0)
MCV: 92.5 fL (ref 80.0–100.0)
Monocytes Absolute: 0.3 10*3/uL (ref 0.1–1.0)
Monocytes Relative: 6 %
Neutro Abs: 3.5 10*3/uL (ref 1.7–7.7)
Neutrophils Relative %: 64 %
Platelets: 220 10*3/uL (ref 150–400)
RBC: 4.24 MIL/uL (ref 3.87–5.11)
RDW: 12.4 % (ref 11.5–15.5)
WBC: 5.4 10*3/uL (ref 4.0–10.5)
nRBC: 0 % (ref 0.0–0.2)

## 2022-03-17 MED ORDER — LORAZEPAM 2 MG/ML IJ SOLN
1.0000 mg | Freq: Once | INTRAMUSCULAR | Status: AC
  Administered 2022-03-17: 1 mg via INTRAVENOUS
  Filled 2022-03-17: qty 1

## 2022-03-17 MED ORDER — AMOXICILLIN 500 MG PO CAPS: 500.0000 mg | ORAL_CAPSULE | Freq: Three times a day (TID) | ORAL | 0 refills | Status: DC

## 2022-03-17 NOTE — Discharge Instructions (Addendum)
CT scan is negative for fracture or abscess.  Follow-up with your dentist regarding your dental pain. there does appear to be some fluid behind the right ear and you are treated for an ear infection.  Follow-up with your primary doctor as well as the ear nose and throat doctor.  Return to the ED with new or worsening symptoms ?

## 2022-03-17 NOTE — ED Notes (Signed)
Pt repositioned in bed.

## 2022-03-17 NOTE — ED Notes (Signed)
Patient transported to CT 

## 2022-03-17 NOTE — ED Notes (Signed)
CT tech Janine made aware pt has received ativan for CT ? ?

## 2022-03-17 NOTE — ED Provider Notes (Signed)
?[place] EMERGENCY DEPARTMENT ?Provider Note ? ? ?CSN: [id number] ?Arrival date & time: 03/17/22  1647 ? ?  ? ?History ? ?Chief Complaint  ?Patient presents with  ? Jaw Pain  ? ? ?[name] [name] is a 71 y.o. female. ? ?Level 5 caveat, patient nonverbal.  She was brought from her facility by EMS with report of jaw pain for the past several days.  She is pointing to her left jaw that is unable to verbalize or give any history.  EMS reports she has a history of dental problems.  No known trauma.  No fever.  No chest pain or shortness of breath.  No difficulty breathing or difficulty swallowing. ?Patient has a history of Parkinson is him as well as supranuclear palsy and cannot give any history. ? ?The history is provided by the patient. The history is limited by the condition of the patient.  ? ?  ? ?Home Medications ?Prior to Admission medications   ?Medication Sig Start Date End Date Taking? Authorizing Provider  ?acetaminophen (TYLENOL) 325 MG tablet Take 650 mg by mouth 2 (two) times daily.     [provider]  ?aspirin 81 MG chewable tablet Chew 81 mg by mouth daily.    [provider]  ?azelastine (ASTELIN) 0.1 % nasal spray Place 2 sprays into both nostrils 2 (two) times daily.     [provider]  ?carbidopa-levodopa (SINEMET IR) 25-100 MG tablet Take 2 tabs in the morning, 1 in the afternoon and 1 in the evening 07/14/20   Tat, Eustace Quail, DO  ?Cholecalciferol (VITAMIN D PO) Take 5,000 Units by mouth daily.    [provider]  ?cyanocobalamin 500 MCG tablet Take 500 mcg by mouth daily.    [provider]  ?diclofenac sodium (VOLTAREN) 1 % GEL Apply 4 g topically 2 (two) times daily.  12/16/18   [provider]  ?escitalopram (LEXAPRO) 10 MG tablet Take 15 mg by mouth daily.     [provider]  ?fenofibrate 160 MG tablet Take 160 mg by mouth every morning.     [provider]  ?fluticasone (FLONASE) 50 MCG/ACT nasal spray Place 2  sprays into both nostrils daily.  12/23/14   [provider]  ?gabapentin (NEURONTIN) 100 MG capsule Take 300 mg by mouth at bedtime. 12/21/19   [provider]  ?HYDROcodone-acetaminophen (NORCO/VICODIN) 5-325 MG tablet Take 2 tablets by mouth every 4 (four) hours as needed. 07/01/20   Volanda Napoleon, PA-C  ?loratadine (CLARITIN) 10 MG tablet Take 10 mg by mouth daily.     [provider]  ?losartan-hydrochlorothiazide (HYZAAR) 100-12.5 MG tablet Take 1 tablet by mouth daily. 12/17/19   [provider]  ?Melatonin 5 MG CAPS Take 10 mg by mouth at bedtime as needed.     [provider]  ?Menthol, Topical Analgesic, (BIOFREEZE EX) Apply topically.    [provider]  ?montelukast (SINGULAIR) 10 MG tablet Take 10 mg by mouth every evening.     [provider]  ?nystatin cream (MYCOSTATIN) Apply 1 application topically 2 (two) times daily.     [provider]  ?Respiratory Therapy Supplies (FLUTTER) DEVI Use as directed 08/27/19   Icard, Octavio Graves, DO  ?traMADol (ULTRAM) 50 MG tablet Take 50 mg by mouth every 6 (six) hours as needed. 05/28/19   [provider]  ?   ? ?Allergies    ?Aleve [naproxen], Protonix [pantoprazole sodium], and Zocor [simvastatin]   ? ?Review  of Systems   ?Review of Systems  ?Unable to perform ROS: Patient nonverbal  ? ?Physical Exam ?Updated Vital Signs ?BP (!) 142/91   Pulse 97   Temp (!) 97.5 ?F (36.4 ?C)   Resp 20   Ht '5\' 1"'$  (1.549 m)   Wt 68 kg   SpO2 94%   BMI 28.34 kg/m?  ?Physical Exam ?Vitals and nursing note reviewed.  ?Constitutional:   ?   General: She is not in acute distress. ?   Appearance: She is well-developed.  ?   Comments: Chronically ill-appearing, nonverbal  ?HENT:  ?   Head: Normocephalic and atraumatic.  ?   Ears:  ?   Comments: No tragus or mastoid pain.  ?Difficult to visualize TMs, no gross purulence. ?   Mouth/Throat:  ?   Pharynx: No oropharyngeal exudate.  ?   Comments: Holding jaw  mostly clenched.  She is able to open her mouth with encouragement.  There is a single molar on the left lower gingiva with no surrounding erythema or abscess.  Floor mouth is soft. ?Eyes:  ?   Conjunctiva/sclera: Conjunctivae normal.  ?   Pupils: Pupils are equal, round, and reactive to light.  ?Neck:  ?   Comments: No meningismus. ?Cardiovascular:  ?   Rate and Rhythm: Normal rate and regular rhythm.  ?   Heart sounds: Normal heart sounds. No murmur heard. ?Pulmonary:  ?   Effort: Pulmonary effort is normal. No respiratory distress.  ?   Breath sounds: Normal breath sounds.  ?Chest:  ?   Chest wall: No tenderness.  ?Abdominal:  ?   Palpations: Abdomen is soft.  ?   Tenderness: There is no abdominal tenderness. There is no guarding or rebound.  ?Musculoskeletal:     ?   General: No tenderness. Normal range of motion.  ?   Cervical back: Normal range of motion and neck supple.  ?Skin: ?   General: Skin is warm.  ?   Findings: No erythema or rash.  ?Neurological:  ?   Mental Status: She is alert.  ?   Cranial Nerves: No cranial nerve deficit.  ?   Motor: No abnormal muscle tone.  ?   Coordination: Coordination normal.  ?   Comments: Moves all extremities spontaneously, does not follow commands.  ?Psychiatric:     ?   Behavior: Behavior normal.  ? ? ?ED Results / Procedures / Treatments   ?Labs ?(all labs ordered are listed, but only abnormal results are displayed) ?Labs Reviewed  ?CBC WITH DIFFERENTIAL/PLATELET  ?BASIC METABOLIC PANEL  ?TROPONIN I (HIGH SENSITIVITY)  ?TROPONIN I (HIGH SENSITIVITY)  ?TROPONIN I (HIGH SENSITIVITY)  ?TROPONIN I (HIGH SENSITIVITY)  ? ? ?EKG ?EKG Interpretation ? ?Date/Time:  Saturday Mar 17 2022 17:06:18 EDT ?Ventricular Rate:  91 ?PR Interval:  192 ?QRS Duration: 82 ?QT Interval:  342 ?QTC Calculation: 420 ?R Axis:   -79 ?Text Interpretation: Normal sinus rhythm Left axis deviation Inferior infarct , age undetermined Anterolateral infarct , age undetermined Abnormal ECG When compared  with ECG of 15-May-2021 07:21, PREVIOUS ECG IS PRESENT No significant change was found Confirmed by Ezequiel Essex [phone]) on 03/17/2022 5:34:29 PM ? ?Radiology ?CT Head Wo Contrast ? ?Result Date: 03/17/2022 ?CLINICAL DATA:  70 year old female with head, jaw and neck pain. EXAM: CT HEAD WITHOUT CONTRAST CT MAXILLOFACIAL WITHOUT CONTRAST CT CERVICAL SPINE WITHOUT CONTRAST TECHNIQUE: Multidetector CT imaging of the head, cervical spine, and maxillofacial structures were performed using the standard protocol without intravenous contrast.  Multiplanar CT image reconstructions of the cervical spine and maxillofacial structures were also generated. RADIATION DOSE REDUCTION: This exam was performed according to the departmental dose-optimization program which includes automated exposure control, adjustment of the mA and/or kV according to patient size and/or use of iterative reconstruction technique. COMPARISON:  05/27/2018 CTs and prior studies FINDINGS: CT HEAD FINDINGS Brain: No evidence of acute infarction, hemorrhage, hydrocephalus, extra-axial collection or mass lesion/mass effect. Atrophy and chronic small-vessel white matter ischemic changes again noted. Vascular: Carotid atherosclerotic calcifications are noted. Skull: No acute abnormality Other: Fluid within RIGHT mastoid air cells, RIGHT middle and inner ear noted. CT MAXILLOFACIAL FINDINGS Osseous: Bilateral nasal bone fractures appear remote but correlate clinically. No other fractures are identified. Orbits: Negative. No traumatic or inflammatory finding. Sinuses: Large RIGHT mastoid effusion with fluid in the RIGHT middle and INNER ear noted. No other acute abnormalities noted. Soft tissues: No acute abnormality CT CERVICAL SPINE FINDINGS Alignment: Straightening of the normal cervical lordosis is noted. No subluxation identified. Skull base and vertebrae: No acute fracture. No primary bone lesion or focal pathologic process. Soft tissues and spinal canal: No  prevertebral fluid or swelling. No visible canal hematoma. Disc levels: Anterior/interbody fusion changes from C4-C7 are again identified. Degenerative disc disease/facet arthropathy at several levels again not

## 2022-03-17 NOTE — ED Triage Notes (Signed)
Pt BIB  [facility] from [facility]. with c/o jaw pain. Pt non verbal, however answering questions. Denies CP ?

## 2022-03-18 NOTE — ED Notes (Signed)
Attempts to call the [facility] were unsuccessful. ?

## 2022-04-03 DIAGNOSIS — G20 Parkinson's disease: Secondary | ICD-10-CM | POA: Diagnosis not present

## 2022-04-03 DIAGNOSIS — H6521 Chronic serous otitis media, right ear: Secondary | ICD-10-CM | POA: Diagnosis not present

## 2022-04-03 DIAGNOSIS — H6123 Impacted cerumen, bilateral: Secondary | ICD-10-CM | POA: Diagnosis not present

## 2022-04-14 ENCOUNTER — Encounter (HOSPITAL_BASED_OUTPATIENT_CLINIC_OR_DEPARTMENT_OTHER): Payer: Self-pay | Admitting: *Deleted

## 2022-04-14 ENCOUNTER — Emergency Department (HOSPITAL_BASED_OUTPATIENT_CLINIC_OR_DEPARTMENT_OTHER)
Admission: EM | Admit: 2022-04-14 | Discharge: 2022-04-14 | Disposition: A | Discharge status: Home / Self Care | Payer: PPO | Attending: Emergency Medicine | Admitting: Emergency Medicine

## 2022-04-14 DIAGNOSIS — Z7982 Long term (current) use of aspirin: Secondary | ICD-10-CM | POA: Diagnosis not present

## 2022-04-14 DIAGNOSIS — Z79899 Other long term (current) drug therapy: Secondary | ICD-10-CM | POA: Insufficient documentation

## 2022-04-14 DIAGNOSIS — R6884 Jaw pain: Secondary | ICD-10-CM | POA: Insufficient documentation

## 2022-04-14 DIAGNOSIS — H66002 Acute suppurative otitis media without spontaneous rupture of ear drum, left ear: Secondary | ICD-10-CM | POA: Diagnosis not present

## 2022-04-14 DIAGNOSIS — H9201 Otalgia, right ear: Secondary | ICD-10-CM | POA: Diagnosis present

## 2022-04-14 MED ORDER — AMOXICILLIN-POT CLAVULANATE 875-125 MG PO TABS: 1.0000 | ORAL_TABLET | Freq: Two times a day (BID) | ORAL | 0 refills | Status: AC

## 2022-04-14 NOTE — ED Notes (Signed)
HIGH FALL RISK interventions remain in place.

## 2022-04-14 NOTE — ED Notes (Signed)
PTAR at bedside 

## 2022-04-14 NOTE — ED Triage Notes (Signed)
Arrived via [facility], was told by caregiver of client that pt is complaining of jaw pain, did not specify which side. Was given '1000mg'$  of Tylenol prior to EMS arrival. Pt has had otitis media recently and does  not seem to be improving from the abx prescribed.

## 2022-04-14 NOTE — ED Provider Notes (Addendum)
[name] Provider Note   CSN: [id number] Arrival date & time: 04/14/22  0747     History  Chief Complaint  Patient presents with   Jaw Pain    [name] is a 71 y.o. female.  Patient is a 71 year old who presents with possible jaw pain.  She is brought in by EMS.  There is no family or caretakers at bedside.  Per EMS, they were told by the patient's caretaker that she has been complaining of jaw pain.  She was given 1 g of Tylenol prior to EMS arrival.  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  No other history is obtained.  Chart was reviewed.  Patient was seen here in the emergency department about a month ago for jaw pain.  She had an extensive work-up including CTs of her head, maxillofacial and cervical spine.  She was noted to have a large right ear effusion.  This was felt to be possible the etiology of the pain.  She was started on amoxicillin.  She recently has followed up with ENT.  She was noted to have a persistent serous effusion of the right ear.  TM tubes were discussed but the family opted not to do it at that point.      Home Medications Prior to Admission medications   Medication Sig Start Date End Date Taking? Authorizing Provider  acetaminophen (TYLENOL) 325 MG tablet Take 650 mg by mouth 2 (two) times daily.     [provider]  amoxicillin-clavulanate (AUGMENTIN) 875-125 MG tablet Take 1 tablet by mouth every 12 (twelve) hours. 04/14/22  Yes Malvin Johns, MD  aspirin 81 MG chewable tablet Chew 81 mg by mouth daily.    [provider]  azelastine (ASTELIN) 0.1 % nasal spray Place 2 sprays into both nostrils 2 (two) times daily.     [provider]  carbidopa-levodopa (SINEMET IR) 25-100 MG tablet Take 2 tabs in the morning, 1 in the afternoon and 1 in the evening 07/14/20   Tat, Wells Guiles S, DO  Cholecalciferol (VITAMIN D PO) Take 5,000 Units  by mouth daily.    [provider]  cyanocobalamin 500 MCG tablet Take 500 mcg by mouth daily.    [provider]  diclofenac sodium (VOLTAREN) 1 % GEL Apply 4 g topically 2 (two) times daily.  12/16/18   [provider]  escitalopram (LEXAPRO) 10 MG tablet Take 15 mg by mouth daily.     [provider]  fenofibrate 160 MG tablet Take 160 mg by mouth every morning.     [provider]  fluticasone (FLONASE) 50 MCG/ACT nasal spray Place 2 sprays into both nostrils daily.  12/23/14   [provider]  gabapentin (NEURONTIN) 100 MG capsule Take 300 mg by mouth at bedtime. 12/21/19   [provider]  HYDROcodone-acetaminophen (NORCO/VICODIN) 5-325 MG tablet Take 2 tablets by mouth every 4 (four) hours as needed. 07/01/20   Volanda Napoleon, PA-C  loratadine (CLARITIN) 10 MG tablet Take 10 mg by mouth daily.     [provider]  losartan-hydrochlorothiazide (HYZAAR) 100-12.5 MG tablet Take 1 tablet by mouth daily. 12/17/19   [provider]  Melatonin 5 MG CAPS Take 10 mg by mouth at bedtime as needed.     [provider]  Menthol, Topical Analgesic, (BIOFREEZE EX) Apply topically.    [provider]  montelukast (SINGULAIR) 10 MG tablet Take  10 mg by mouth every evening.     [provider]  nystatin cream (MYCOSTATIN) Apply 1 application topically 2 (two) times daily.     [provider]  Respiratory Therapy Supplies (FLUTTER) DEVI Use as directed 08/27/19   Icard, Octavio Graves, DO  traMADol (ULTRAM) 50 MG tablet Take 50 mg by mouth every 6 (six) hours as needed. 05/28/19   [provider]      Allergies    Aleve [naproxen], Protonix [pantoprazole sodium], and Zocor [simvastatin]    Review of Systems   Review of Systems  Unable to perform ROS: Patient nonverbal   Physical Exam Updated Vital Signs BP (!) 139/59 (BP Location: Right Arm)   Pulse 88   Temp 98 F (36.7 C) (Oral)    Resp 18   Ht '5\' 1"'$  (1.549 m)   Wt 61 kg   SpO2 96%   BMI 25.41 kg/m  Physical Exam Constitutional:      Comments: Chronically ill-appearing  HENT:     Head: Normocephalic and atraumatic.     Comments: No facial swelling noted.  There is possibly some tenderness in the bilateral jaw areas.  It is hard to ascertain due to her nonverbal state.  The right TM appears clear with some clear fluid behind the TM.  The left TM appears to have some cloudy fluid behind the TM.  There is no pain over the mastoid.  No swelling of the ear.  No swelling of the canal.  Her eyes appear normal with no other facial swelling.  There is some possible tenderness to the left upper teeth but it is hard to ascertain whether this is coming from her ear or her teeth.  She does have some dental decay.  No swelling around the teeth or abscess is noted.    Nose: Nose normal.     Mouth/Throat:     Mouth: Mucous membranes are moist.     Pharynx: No oropharyngeal exudate or posterior oropharyngeal erythema.  Eyes:     Conjunctiva/sclera: Conjunctivae normal.     Pupils: Pupils are equal, round, and reactive to light.  Cardiovascular:     Rate and Rhythm: Normal rate.     Pulses: Normal pulses.     Heart sounds: No murmur heard. Pulmonary:     Effort: Pulmonary effort is normal.     Breath sounds: Normal breath sounds.  Abdominal:     General: Abdomen is flat. There is no distension.     Tenderness: There is no abdominal tenderness.  Musculoskeletal:        General: Normal range of motion.  Skin:    General: Skin is warm and dry.  Neurological:     Mental Status: Mental status is at baseline.     Comments: Nonverbal, she is awake and alert, she seems to move all extremities symmetrically, seems to be at her baseline mental status per prior notes.    ED Results / Procedures / Treatments   Labs (all labs ordered are listed, but only abnormal results are displayed) Labs Reviewed - No data to  display  EKG None  Radiology No results found.  Procedures Procedures    Medications Ordered in ED Medications - No data to display  ED Course/ Medical Decision Making/ A&P                           Medical Decision Making Risk Prescription drug management.   Patient  is a 71 year old who presents with reported jaw pain.  She is been seen here recently for similar symptoms.  It was felt that this was coming from a right ear infection.  She seems to be pointing to both sides of her jaw.  On exam, it appears that she has a left ear infection.  Her right ear looks okay other than some clear fluid behind the TM.  She does have some tooth decay which could be contributing to her left jaw pain although I do not see any suggestions of infection/abscess.  She had CT scans on her last visit so I do not feel that these need to be repeated.  She does not have a fever.  She does not appear systemically ill.  I feel that it would be prudent to start her on Augmentin given that she recently had amoxicillin.  We will give instructions to have close follow-up with her ear nose and throat doctor.  I also will suggest that the patient follows up with a dentist to have her teeth examined as a possible etiology for her jaw pain.  There is no family or caretakers at bedside.  I attempted to call the son whose contact number is listed in epic.  He did not answer.  I left a message but he has not called back.  There is no other contact numbers.  I called what appeared to be her home number and no one answered this line.  She will be discharged and taken back home [facility].  Her prescription was sent into the listed pharmacy.  Instructions were given on her discharge papers.  10:00: Patient son did call back prior to patient leaving the department.  I explained the findings and recommendations.  He is appreciative and understanding.  Final Clinical Impression(s) / ED Diagnoses Final diagnoses:  Non-recurrent acute  suppurative otitis media of left ear without spontaneous rupture of tympanic membrane    Rx / DC Orders ED Discharge Orders          Ordered    amoxicillin-clavulanate (AUGMENTIN) 875-125 MG tablet  Every 12 hours        04/14/22 0919              Malvin Johns, MD 04/14/22 1660    Malvin Johns, MD 04/14/22 1000

## 2022-04-14 NOTE — Discharge Instructions (Addendum)
It appears that there is an infection of your left ear.  We will start antibiotics for this.  You need to follow-up with your ear nose and throat doctor.  I would also suggest following up with a dentist as there is some tooth decay and it would be prudent to make sure that this is not a potential cause for the jaw pain as well.

## 2022-04-14 NOTE — ED Notes (Signed)
[facility] notified for request of transport from [facility] to her [facility]

## 2022-04-14 NOTE — ED Notes (Signed)
HIGH FALL RISK CLIENT, yellow bracelet applied, sr x 2 up, bed in lowest position, pt placed in exam room 5 with door left open for constant observation and monitoring of client.

## 2022-04-24 DIAGNOSIS — M6281 Muscle weakness (generalized): Secondary | ICD-10-CM | POA: Diagnosis not present

## 2022-04-24 DIAGNOSIS — K5909 Other constipation: Secondary | ICD-10-CM | POA: Diagnosis not present

## 2022-04-24 DIAGNOSIS — G20 Parkinson's disease: Secondary | ICD-10-CM | POA: Diagnosis not present

## 2022-04-24 DIAGNOSIS — I10 Essential (primary) hypertension: Secondary | ICD-10-CM | POA: Diagnosis not present

## 2022-04-24 DIAGNOSIS — Z993 Dependence on wheelchair: Secondary | ICD-10-CM | POA: Diagnosis not present

## 2022-05-03 ENCOUNTER — Emergency Department (HOSPITAL_BASED_OUTPATIENT_CLINIC_OR_DEPARTMENT_OTHER): Payer: PPO

## 2022-05-03 ENCOUNTER — Emergency Department (HOSPITAL_BASED_OUTPATIENT_CLINIC_OR_DEPARTMENT_OTHER)
Admission: EM | Admit: 2022-05-03 | Discharge: 2022-05-03 | Disposition: A | Discharge status: Home / Self Care | Payer: PPO | Attending: Emergency Medicine | Admitting: Emergency Medicine

## 2022-05-03 ENCOUNTER — Encounter (HOSPITAL_BASED_OUTPATIENT_CLINIC_OR_DEPARTMENT_OTHER): Payer: Self-pay | Admitting: Emergency Medicine

## 2022-05-03 DIAGNOSIS — E119 Type 2 diabetes mellitus without complications: Secondary | ICD-10-CM | POA: Diagnosis not present

## 2022-05-03 DIAGNOSIS — Z7982 Long term (current) use of aspirin: Secondary | ICD-10-CM | POA: Diagnosis not present

## 2022-05-03 DIAGNOSIS — Z79899 Other long term (current) drug therapy: Secondary | ICD-10-CM | POA: Diagnosis not present

## 2022-05-03 DIAGNOSIS — R059 Cough, unspecified: Secondary | ICD-10-CM | POA: Diagnosis not present

## 2022-05-03 DIAGNOSIS — R051 Acute cough: Secondary | ICD-10-CM

## 2022-05-03 DIAGNOSIS — I10 Essential (primary) hypertension: Secondary | ICD-10-CM | POA: Diagnosis not present

## 2022-05-03 DIAGNOSIS — G20 Parkinson's disease: Secondary | ICD-10-CM | POA: Insufficient documentation

## 2022-05-03 DIAGNOSIS — R402431 Glasgow coma scale score 3-8, in the field [EMT or ambulance]: Secondary | ICD-10-CM | POA: Diagnosis not present

## 2022-05-03 NOTE — ED Notes (Signed)
Pt assisted into car from str, husband to take her home

## 2022-05-03 NOTE — ED Provider Notes (Signed)
[facility] DEPARTMENT Provider Note   CSN: [id number] Arrival date & time: 05/03/22  1327     History  Chief Complaint  Patient presents with   Cough    [name] FILSA[facility] is a 71 y.o. female with a past medical history of Parkinson's disease, diabetes, hypertension, hyperlipidemia presenting to the ED with concern for choking episode.  History provided by husband at the bedside.  Patient was eating breakfast this morning when he was concerned that she aspirated due to coughing.  This episode lasted for a lot longer than it typically does when she eats.  She was sent to the ER for a chest x-ray.  He denies any other complaints, states that she is otherwise acting at her baseline.  Denies any vomiting or fever.  No recent injury or trauma   Cough Associated symptoms: no fever        Home Medications Prior to Admission medications   Medication Sig Start Date End Date Taking? Authorizing Provider  acetaminophen (TYLENOL) 325 MG tablet Take 650 mg by mouth 2 (two) times daily.     [provider]  amoxicillin-clavulanate (AUGMENTIN) 875-125 MG tablet Take 1 tablet by mouth every 12 (twelve) hours. 04/14/22   Malvin Johns, MD  aspirin 81 MG chewable tablet Chew 81 mg by mouth daily.    [provider]  azelastine (ASTELIN) 0.1 % nasal spray Place 2 sprays into both nostrils 2 (two) times daily.     [provider]  carbidopa-levodopa (SINEMET IR) 25-100 MG tablet Take 2 tabs in the morning, 1 in the afternoon and 1 in the evening 07/14/20   Tat, Wells Guiles S, DO  Cholecalciferol (VITAMIN D PO) Take 5,000 Units by mouth daily.    [provider]  cyanocobalamin 500 MCG tablet Take 500 mcg by mouth daily.    [provider]  diclofenac sodium (VOLTAREN) 1 % GEL Apply 4 g topically 2 (two) times daily.  12/16/18   [provider]  escitalopram (LEXAPRO) 10 MG tablet Take 15 mg by mouth daily.     [provider]   fenofibrate 160 MG tablet Take 160 mg by mouth every morning.     [provider]  fluticasone (FLONASE) 50 MCG/ACT nasal spray Place 2 sprays into both nostrils daily.  12/23/14   [provider]  gabapentin (NEURONTIN) 100 MG capsule Take 300 mg by mouth at bedtime. 12/21/19   [provider]  HYDROcodone-acetaminophen (NORCO/VICODIN) 5-325 MG tablet Take 2 tablets by mouth every 4 (four) hours as needed. 07/01/20   Volanda Napoleon, PA-C  loratadine (CLARITIN) 10 MG tablet Take 10 mg by mouth daily.     [provider]  losartan-hydrochlorothiazide (HYZAAR) 100-12.5 MG tablet Take 1 tablet by mouth daily. 12/17/19   [provider]  Melatonin 5 MG CAPS Take 10 mg by mouth at bedtime as needed.     [provider]  Menthol, Topical Analgesic, (BIOFREEZE EX) Apply topically.    [provider]  montelukast (SINGULAIR) 10 MG tablet Take 10 mg by mouth every evening.     [provider]  nystatin cream (MYCOSTATIN) Apply 1 application topically 2 (two) times daily.     [provider]  Respiratory Therapy Supplies (FLUTTER) DEVI Use as directed 08/27/19   Icard, Octavio Graves, DO  traMADol (ULTRAM) 50 MG tablet Take 50 mg by mouth every 6 (six) hours as needed. 05/28/19   [provider]  Allergies    Aleve [naproxen], Protonix [pantoprazole sodium], and Zocor [simvastatin]    Review of Systems   Review of Systems  Constitutional:  Negative for fever.  Respiratory:  Positive for cough.   Gastrointestinal:  Negative for vomiting.    Physical Exam Updated Vital Signs BP 133/74   Pulse 94   Temp 97.7 F (36.5 C) (Oral)   Resp 20   SpO2 97%  Physical Exam Vitals and nursing note reviewed.  Constitutional:      General: She is not in acute distress.    Appearance: She is well-developed. She is not diaphoretic.  HENT:     Head: Normocephalic and atraumatic.     Nose: Nose normal.  Eyes:      General: No scleral icterus.       Right eye: No discharge.        Left eye: No discharge.     Conjunctiva/sclera: Conjunctivae normal.  Cardiovascular:     Rate and Rhythm: Normal rate and regular rhythm.     Heart sounds: Normal heart sounds. No murmur heard.    No friction rub. No gallop.  Pulmonary:     Effort: Pulmonary effort is normal. No respiratory distress.     Breath sounds: Normal breath sounds.  Abdominal:     General: There is no distension.     Palpations: Abdomen is soft.     Tenderness: There is no abdominal tenderness.  Musculoskeletal:        General: Normal range of motion.     Cervical back: Normal range of motion and neck supple.  Skin:    General: Skin is warm and dry.  Neurological:     Mental Status: She is alert.     Motor: No abnormal muscle tone.     ED Results / Procedures / Treatments   Labs (all labs ordered are listed, but only abnormal results are displayed) Labs Reviewed - No data to display  EKG None  Radiology DG Chest Portable 1 View  Result Date: 05/03/2022 CLINICAL DATA:  Cough EXAM: PORTABLE CHEST 1 VIEW COMPARISON:  Chest x-ray 09/09/2019 FINDINGS: Heart size and mediastinal contours are within normal limits. No focal pulmonary opacities identified. No pleural effusion or pneumothorax visualized. No acute osseous abnormality appreciated. IMPRESSION: No acute intrathoracic process identified. Electronically Signed   By: Ofilia Neas M.D.   On: 05/03/2022 13:56    Procedures Procedures    Medications Ordered in ED Medications - No data to display  ED Course/ Medical Decision Making/ A&P                           Medical Decision Making Amount and/or Complexity of Data Reviewed Radiology: ordered.   71 year old female with a past medical history of Parkinson's disease, hypertension, hyperlipidemia presenting to the ED for cough and possible choking episode.  Patient is from [facility].  Husband states that while she was eating  breakfast she had an episode of choking and was concerned that she aspirated.  This is happened to her before however this current episode of coughing last longer than usual.  States that he denies any other complaints, she is otherwise acting at her baseline.  Denies any vomiting, recent injuries.  On exam lungs are clear bilaterally.  Abdomen appears soft.  She is not tachycardic, tachypneic or hypoxic.  Chest x-ray done today shows no acute findings.  Informed husband of these results.  Offered additional work-up for  any other symptoms but he states that she is otherwise at her baseline.  Will discharge with continued home medications and follow-up with PCP.  Return precautions given.    Patient is hemodynamically stable, in NAD, and able to ambulate in the ED. Evaluation does not show pathology that would require ongoing emergent intervention or inpatient treatment. I explained the diagnosis to the patient. Pain has been managed and has no complaints prior to discharge. Patient is comfortable with above plan and is stable for discharge at this time. All questions were answered prior to disposition. Strict return precautions for returning to the ED were discussed. Encouraged follow up with PCP.   An After Visit Summary was printed and given to the patient.   Portions of this note were generated with [employer]. Dictation errors may occur despite best attempts at proofreading.         Final Clinical Impression(s) / ED Diagnoses Final diagnoses:  Acute cough    Rx / DC Orders ED Discharge Orders     None         Delia Heady, PA-C 05/03/22 1458    Blanchie Dessert, MD 05/10/22 0020

## 2022-05-03 NOTE — ED Triage Notes (Addendum)
Pt BIB ambulance  from [facility] , pt is [facility] ,after husband called states that around 930 this am she was being fed and had a choking episode  which is not unusual for pt as she is  [facility], husband called ems due pt coughing more than usual  after the episode and he wants a chest xray

## 2022-05-03 NOTE — Discharge Instructions (Addendum)
Continue your home medications as previously prescribed. Follow-up with your primary care provider. Return to the ER if you start to experience worsening symptoms, increased cough, chest pain, shortness of breath or vomiting

## 2022-05-04 ENCOUNTER — Telehealth: Payer: Self-pay | Admitting: Internal Medicine

## 2022-05-07 NOTE — Progress Notes (Signed)
Assessment/Plan:   1.  PSP  -pt has been discharged from hospice care due to exceeding life expectancy.  However, when I looked at her today, she looked very frail and I expect that her life expectancy is much less than 6 months.  Husband and caregiver state that she has dramatically declined over the last few weeks.  She is having trouble swallowing.  She is having trouble feeding.  I am going to send new referral to hospice.  -they want to continue carbidopa/levodopa 25/100, 2/1/1.  However, they admit that she is having significant difficulty with swallowing.  Told them that they can dissolve this in ginger ale.  -They are not interested in PEG tube.  She is DNR.  -Discussed with them issues with feeding.  I am not sure that it is really in her best interest to continue with any type of food.  I am really not sure that it is in her best interest to continue liquids at this time, but will let hospice addressed with patient and family.   2.  Dysphagia and cough, associated with PSP  -Patient has seen Dr. Valeta Harms years ago.  CoughAssist device was ordered for the patient, but patient does not use it.  3.  Sleep apnea  -No longer wearing CPAP.  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  -not sure that botox is valuable and discussed this with them  -increase baclofen to 10 mg bid, although I am not really sure that it would help much.  Given baclofen solution.  -Discussed with them that they should not be putting metal forks and spoons in her mouth, but rather using utensils that we would use for young children.  She could break her teeth.  -PDMP is reviewed.  She is on tramadol, although they state they are not giving it very much.  PDMP reflects that they are giving it, although they do not think she is in pain.  They state that they are backing off on getting it.  -they ask about new PWR WCR.   Discussed with them that insurance would not pay for a new power wheelchair because she would not be  able to utilize it herself.  They felt that they could help her better if they had a power wheelchair to move her around in.   Subjective:   [name] [name] was seen today in follow up for PSP.  My previous records were reviewed prior to todays visit as well as outside records available to me.  Pt with friend [name] and paid caregiver, [name],  who supplements the history.  I have not seen the patient since October, 2021.  At that point in time, she was looking at hospice care.  She has apparently been discharged from hospice care.  She was in the emergency room June 3 with what is described as jaw pain.  Emergency physician stated that "history is limited as patient has Parkinson's and supranuclear palsy and is nonverbal at baseline."  Stated that patient had large right ear effusion and started on Augmentin (was previously on amoxicillin for the same).  She was instructed to follow-up with ENT.  She is back in the emergency room June 22 with cough/choking episode/possible aspiration.  Chest x-ray was completed and was negative.  She is having trouble feeding esp in the last week.  She cannot even drink through a straw in the last week and they are "pouring liquids in to the mouth" over the last week.  No MBE for few years.  She coughs and seems to choke some.  She has trouble swallowing pills and sometimes she will hold them in the mouth and they will just run out.   She does not speak with the exception of occ "yes or no."  She has control over bladder/bowel.  She doesn't ambulate.  They state that it was a steady decline until recently and now much worse over the last few weeks.  She is DNR.  Son lives in [place] but not very involved in care.  She doesn't seem to be in pain.  She doesn't have hosp bed and didn't even have one when enrolled with hospice but her queen bed has elevation of head/foot.  It has no bed rails and that have to be careful.  They are having an issue when they put something in her  mouth she will bite down quickly.  Sometimes, she cannot get the mouth open to put things in her mouth.  They went to dentist but wasn't able to eval b/c couldn't open the mouth to evaluate it properly and told them that she would need full sedation.  The issue is that she is having clenching when opening the mouth and biting on the fork and can't let go.    Current prescribed movement disorder medications: Carbidopa/levodopa 25/100, 2/1/1  Baclofen 5 mg tid (RX by hospice prn)    ALLERGIES:   Allergies  Allergen Reactions   Aleve [Naproxen] Hives, Shortness Of Breath and Rash    Pt Avoids All Nsaids   Protonix [Pantoprazole Sodium] Other (See Comments)    Makes her feel wreidd   Zocor [Simvastatin] Other (See Comments)    "weird feeling all over body"    CURRENT MEDICATIONS:  Outpatient Encounter Medications as of 05/10/2022  Medication Sig   acetaminophen (TYLENOL) 325 MG tablet Take 650 mg by mouth 2 (two) times daily.    carbidopa-levodopa (SINEMET IR) 25-100 MG tablet Take 2 tabs in the morning, 1 in the afternoon and 1 in the evening (Patient taking differently: Take 1 tablet by mouth once. Take one tablet at bedtime.)   diclofenac sodium (VOLTAREN) 1 % GEL Apply 4 g topically 2 (two) times daily.    escitalopram (LEXAPRO) 5 MG tablet Take 5 mg by mouth daily.   gabapentin (NEURONTIN) 100 MG capsule Take 100 mg by mouth at bedtime. Take two capsules at bedtime and 1 capsule at 4 am   amoxicillin-clavulanate (AUGMENTIN) 875-125 MG tablet Take 1 tablet by mouth every 12 (twelve) hours. (Patient not taking: Reported on 05/10/2022)   aspirin 81 MG chewable tablet Chew 81 mg by mouth daily.   azelastine (ASTELIN) 0.1 % nasal spray Place 2 sprays into both nostrils 2 (two) times daily.    Baclofen 5 MG TABS Take 1 tablet by mouth 3 (three) times daily as needed.   Cholecalciferol (VITAMIN D PO) Take 5,000 Units by mouth daily.   cyanocobalamin 500 MCG tablet Take 500 mcg by mouth daily.    fenofibrate 160 MG tablet Take 160 mg by mouth every morning.    fluticasone (FLONASE) 50 MCG/ACT nasal spray Place 2 sprays into both nostrils daily.    hydrochlorothiazide (HYDRODIURIL) 12.5 MG tablet Take 12.5 mg by mouth daily.   HYDROcodone-acetaminophen (NORCO/VICODIN) 5-325 MG tablet Take 2 tablets by mouth every 4 (four) hours as needed.   loratadine (CLARITIN) 10 MG tablet Take 10 mg by mouth daily.    losartan-hydrochlorothiazide (HYZAAR) 100-12.5 MG tablet Take 1  tablet by mouth daily.   Melatonin 5 MG CAPS Take 10 mg by mouth at bedtime as needed.    Menthol, Topical Analgesic, (BIOFREEZE EX) Apply topically.   montelukast (SINGULAIR) 10 MG tablet Take 10 mg by mouth every evening.    nystatin cream (MYCOSTATIN) Apply 1 application topically 2 (two) times daily.    Respiratory Therapy Supplies (FLUTTER) DEVI Use as directed   traMADol (ULTRAM) 50 MG tablet Take 50 mg by mouth every 6 (six) hours as needed.   No facility-administered encounter medications on file as of 05/10/2022.    Objective:   PHYSICAL EXAMINATION:    VITALS:   Vitals:   05/10/22 0838  BP: 106/68  Pulse: 87  SpO2: 91%  Height: '5\' 1"'$  (1.549 m)     GEN:  The patient appears stated age and looks a bit uncomfortable HEENT:  Normocephalic, atraumatic.  The mucous membranes are dry.  Does not follow commands for extraocular muscle testing.  The superficial temporal arteries are without ropiness or tenderness. CV:  RRR Lungs:  CTAB Neck/HEME:  There are no carotid bruits bilaterally.  Neurological examination:  Orientation: The patient is alert.  She sometimes follows simple commands.  She is slipping down in a wheelchair, held up nearly by a gait belt.   Cranial nerves: She occasionally will moan.  The only thing I heard her say intelligibility is moaning the word "okay."  She otherwise does not speak. Sensation: Sensation is intact to light touch throughout Motor: Sometimes, the left leg is held  out straight in front of her.  The left arm is behind her back and stuck between her back and the wheelchair.  Movement examination: Tone: Tone is normal in the legs, but she has difficulty with relaxing them. Abnormal movements: No dyskinesia noted today (which likely says she has not gotten medication)  I have reviewed and interpreted the following labs independently    Chemistry      Component Value Date/Time   NA 140 03/17/2022 1815   K 4.1 03/17/2022 1815   CL 108 03/17/2022 1815   CO2 25 03/17/2022 1815   BUN 22 03/17/2022 1815   CREATININE 0.48 03/17/2022 1815      Component Value Date/Time   CALCIUM 9.8 03/17/2022 1815   ALKPHOS 67 03/09/2019 2114   AST 15 03/09/2019 2114   ALT <5 03/09/2019 2114   BILITOT 0.6 03/09/2019 2114       Lab Results  Component Value Date   WBC 5.4 03/17/2022   HGB 12.8 03/17/2022   HCT 39.2 03/17/2022   MCV 92.5 03/17/2022   PLT 220 03/17/2022    Lab Results  Component Value Date   TSH 0.989 03/28/2018     Total time spent on today's visit was 50 minutes, including both face-to-face time and nonface-to-face time.  Time included that spent on review of records (prior notes available to me/labs/imaging if pertinent), discussing treatment and goals, answering patient's questions and coordinating care.  Cc:  [name], MD

## 2022-05-09 DIAGNOSIS — B351 Tinea unguium: Secondary | ICD-10-CM | POA: Diagnosis not present

## 2022-05-09 DIAGNOSIS — E119 Type 2 diabetes mellitus without complications: Secondary | ICD-10-CM | POA: Diagnosis not present

## 2022-05-09 DIAGNOSIS — M79671 Pain in right foot: Secondary | ICD-10-CM | POA: Diagnosis not present

## 2022-05-09 DIAGNOSIS — E11621 Type 2 diabetes mellitus with foot ulcer: Secondary | ICD-10-CM | POA: Diagnosis not present

## 2022-05-09 DIAGNOSIS — M79672 Pain in left foot: Secondary | ICD-10-CM | POA: Diagnosis not present

## 2022-05-09 DIAGNOSIS — L89893 Pressure ulcer of other site, stage 3: Secondary | ICD-10-CM | POA: Diagnosis not present

## 2022-05-10 ENCOUNTER — Telehealth: Payer: Self-pay | Admitting: Neurology

## 2022-05-10 ENCOUNTER — Ambulatory Visit: Payer: PPO | Admitting: Neurology

## 2022-05-10 ENCOUNTER — Encounter: Payer: Self-pay | Admitting: Neurology

## 2022-05-10 ENCOUNTER — Other Ambulatory Visit: Payer: Self-pay | Admitting: Neurology

## 2022-05-10 VITALS — BP 106/68 | HR 87 | Ht 61.0 in

## 2022-05-10 DIAGNOSIS — G231 Progressive supranuclear ophthalmoplegia [Steele-Richardson-Olszewski]: Secondary | ICD-10-CM

## 2022-05-10 DIAGNOSIS — Z515 Encounter for palliative care: Secondary | ICD-10-CM

## 2022-05-10 MED ORDER — BACLOFEN 10 MG/5ML IT SOLN: INTRATHECAL | 6 refills | Status: AC

## 2022-05-10 NOTE — Telephone Encounter (Signed)
Erin from [facility] called in and stated they got a referral for this patient, but they didn't get everything. She stated their fax number is [phone]. They need an order, demographics, family contact information, notes, a list of medications and would like to know if the pt has a life expectancy of 6 months or less.

## 2022-05-10 NOTE — Telephone Encounter (Signed)
I called [name] [name] back to question why we all of a sudden need to fax a packet for an internal referral.I was not aware that procedures had changed and part of our relationship with [facility] are that they are an internal comepany and are able to see allo f our notes , insurance and orders. My biggest concern is this aptient will now get delayed care because of ta change in procedure.Packet has been faxed

## 2022-05-11 NOTE — Telephone Encounter (Signed)
The following message was left with [facility] on 05/11/22 at 12:40 PM.  Caller states she is from [place] and is needing prescription sent to a different location. They do not carry injectable form of the medicine.

## 2022-05-14 MED ORDER — BACLOFEN 10 MG PO TABS: 10.0000 mg | ORAL_TABLET | Freq: Two times a day (BID) | ORAL | 1 refills | Status: AC

## 2022-05-14 NOTE — Telephone Encounter (Signed)
Called and spoke to Duncan and asked if there was another pharmacy we could send patients Baclofen too? He stated we can send it to [facility] on [address] in [place].  [name] wanted to see if Dr. Carles Collet could switch the prescription to tablet or liquid form. He was notified by the pharmacy staff that the injection would cost $15,000. So the injection is out the question per [name].   Informed [name] I would get this message to Dr. Carles Collet and call him back.

## 2022-05-14 NOTE — Telephone Encounter (Signed)
Called [name] [name] and informed him that we have sent the tablet form of baclofen due to pharamcy not carrying liquid form. [name] [name] verbalized understanding and had no further questions or concerns.  [name] [name] has informed me that patient was enrolled in Hospice care on Friday 05/11/22. Informed Dr. Carles Collet of this.

## 2022-05-14 NOTE — Telephone Encounter (Signed)
Called pharmacy. They are closed for lunch until 2pm. Will call back.

## 2022-05-16 ENCOUNTER — Encounter: Payer: PPO | Admitting: Internal Medicine

## 2022-05-16 NOTE — Progress Notes (Deleted)
[facility] Consult Note Telephone: [phone]  Fax: [phone]   Date of encounter: 05/16/22 7:27 AM PATIENT NAME: [name] [name] [address] [place] [address]   [phone] (home)  DOB: 06-11-1951 MRN: [id number] PRIMARY CARE PROVIDER:    [name], MD,  [address] [place] [address] [phone]  REFERRING PROVIDER:   [name], MD [address] [place],  [place] [address] [phone]  RESPONSIBLE PARTY:    Contact Information     Name Relation Home Work Mobile   Nassau Jr,Ricky Son [phone]     Meda Klinefelter   [phone]        I met face to face with patient and family in *** home/facility. Palliative Care was asked to follow this patient by consultation request of  [name], MD to address advance care planning and complex medical decision making. This is the initial visit.                                     ASSESSMENT AND PLAN / RECOMMENDATIONS:   Advance Care Planning/Goals of Care: Goals include to maximize quality of life and symptom management. Patient/health care surrogate gave his/her permission to discuss.Our advance care planning conversation included a discussion about:    The value and importance of advance care planning  Experiences with loved ones who have been seriously ill or have died  Exploration of personal, cultural or spiritual beliefs that might influence medical decisions  Exploration of goals of care in the event of a sudden injury or illness  Identification  of a healthcare agent  Review and updating or creation of an  advance directive document . Decision not to resuscitate or to de-escalate disease focused treatments due to poor prognosis. CODE STATUS:  Symptom Management/Plan:    Follow up Palliative Care Visit: Palliative care will continue to follow for complex medical decision making, advance care planning,  and clarification of goals. Return *** weeks or prn.  I spent *** minutes providing this consultation. More than 50% of the time in this consultation was spent in counseling and care coordination.  This visit was coded based on medical decision making (MDM).***  PPS: ***0%  HOSPICE ELIGIBILITY/DIAGNOSIS: TBD  Chief Complaint: ***  HISTORY OF PRESENT ILLNESS:  [name] [name] is a 71 y.o. year old female  with *** .   History obtained from review of EMR, discussion with primary team, and interview with family, facility staff/caregiver and/or Ms. Pankratz.  I reviewed available labs, medications, imaging, studies and related documents from the EMR.  Records reviewed and summarized above.   ROS  *** General: NAD EYES: denies vision changes ENMT: denies dysphagia Cardiovascular: denies chest pain, denies DOE Pulmonary: denies cough, denies increased SOB Abdomen: endorses good appetite, denies constipation, endorses continence of bowel GU: denies dysuria, endorses continence of urine MSK:  denies increased weakness,  no falls reported Skin: denies rashes or wounds Neurological: denies pain, denies insomnia Psych: Endorses positive mood Heme/lymph/immuno: denies bruises, abnormal bleeding  Physical Exam: Current and past weights: Constitutional: NAD General: frail appearing, thin/WNWD/obese  EYES: anicteric sclera, lids intact, no discharge  ENMT: intact hearing, oral mucous membranes moist, dentition intact CV: S1S2, RRR, no LE edema Pulmonary: LCTA, no increased work of breathing, no cough, room air Abdomen: intake 100%, normo-active BS + 4 quadrants, soft and  non tender, no ascites GU: deferred MSK: no sarcopenia, moves all extremities, ambulatory Skin: warm and dry, no rashes or wounds on visible skin Neuro:  no generalized weakness,  no cognitive impairment Psych: non-anxious affect, A and O x 3 Hem/lymph/immuno: no widespread bruising CURRENT PROBLEM LIST:  Patient  Active Problem List   Diagnosis Date Noted   Palliative care encounter 03/09/2019   Pressure injury of skin 03/06/2019   Sepsis ([place]) 03/05/2019   Cellulitis of buttock, left 03/05/2019   Bradycardia 04/01/2018   UTI (urinary tract infection) 04/01/2018   ARF (acute renal failure) ([place]) 04/01/2018   Right ureteral stone 43/32/9518   Acute diastolic heart failure ([place]) 04/01/2018   Acute respiratory failure with hypoxia (HCC)    Pyelonephritis    Severe sepsis ([place])    Pyohydronephrosis 03/29/2018   Septic shock ([place]) 03/29/2018   Hypokalemia 03/28/2018   Severe sepsis with septic shock (HCC) 03/28/2018   Elevated lactic acid level 03/28/2018   Elevated troponin 03/28/2018   PSP (progressive supranuclear palsy) ([place]) 02/18/2018   Acute serous otitis media of right ear 01/13/2018   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/13/2018   Chronic cough 11/19/2017   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   Cervical stenosis of spinal canal 03/23/2016   OSA on CPAP 12/14/2015   Dizziness and giddiness 11/24/2013   Diabetes type 2, uncontrolled 11/24/2013   Severe obesity (BMI >= 40) ([place]) 11/24/2013   Lumbar radiculopathy 11/24/2013   Osteoporosis 10/14/2012   Allergic rhinitis 09/08/2010   Type II or unspecified type diabetes mellitus without mention of complication, not stated as uncontrolled 08/23/2010   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[place]) 07/28/2010   Vitamin D deficiency 07/28/2010   Benign essential hypertension 07/26/2010   Depressive disorder, not elsewhere classified 09/23/2009   Disorder of bone and cartilage 09/23/2009   Contact dermatitis and other eczema, due to unspecified cause 84/16/6063   Diastolic dysfunction 01/60/1093   PAST MEDICAL HISTORY:  Active Ambulatory Problems    Diagnosis Date Noted   Dizziness and giddiness 11/24/2013   Diabetes type 2, uncontrolled 11/24/2013   Severe obesity (BMI >= 40) ([place]) 11/24/2013   Lumbar  radiculopathy 11/24/2013   OSA on CPAP 12/14/2015   Cervical stenosis of spinal canal 03/23/2016   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   Chronic cough 11/19/2017   PSP (progressive supranuclear palsy) ([place]) 02/18/2018   Hypokalemia 03/28/2018   Severe sepsis with septic shock ([place]) 03/28/2018   Elevated lactic acid level 03/28/2018   Elevated troponin 03/28/2018   Pyohydronephrosis 03/29/2018   Septic shock ([place]) 03/29/2018   Bradycardia 04/01/2018   UTI (urinary tract infection) 04/01/2018   ARF (acute renal failure) ([place]) 04/01/2018   Right ureteral stone 04/01/2018   Acute respiratory failure with hypoxia ([place])    Pyelonephritis    Severe sepsis ([place])    Acute diastolic heart failure ([place]) 04/01/2018   Sepsis ([place]) 03/05/2019   Cellulitis of buttock, left 03/05/2019   Pressure injury of skin 03/06/2019   Palliative care encounter 03/09/2019   Acute serous otitis media of right ear 01/13/2018   Allergic rhinitis 09/08/2010   Benign essential hypertension 07/26/2010   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[place]) 07/28/2010   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/13/2018   Contact dermatitis and other eczema, due to unspecified cause 06/03/2009   Depressive disorder, not elsewhere classified 23/55/7322   Diastolic dysfunction 02/54/2706   Disorder of bone and cartilage 09/23/2009   Osteoporosis  10/14/2012   Type II or unspecified type diabetes mellitus without mention of complication, not stated as uncontrolled 08/23/2010   Vitamin D deficiency 07/28/2010   Resolved Ambulatory Problems    Diagnosis Date Noted   No Resolved Ambulatory Problems   Past Medical History:  Diagnosis Date   Acute kidney failure (HCC)    Arthritis    Bulge of cervical disc without myelopathy    CHF (congestive heart failure) (HCC)    Chronic lumbar radiculopathy    Constipation    Depression    Diverticulosis large intestine w/o perforation or abscess w/bleeding     Dizziness    Dry eye syndrome of bilateral lacrimal glands    Frequency of urination    GERD (gastroesophageal reflux disease)    Hard of hearing    History of kidney stones 05/12/2015   HTN (hypertension)    Hyperlipidemia    Morbid obesity due to excess calories (HCC)    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HCC)    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HCC)    Numbness and tingling in hands    OSA (obstructive sleep apnea)    Parkinson disease (HCC)    Polyneuropathy, diabetic (HCC)    Progressive supranuclear ophthalmoplegia (HCC)    Progressive supranuclear palsy (HCC)    Pyonephrosis    Pyonephrosis    Radiculopathy of lumbar region    Respiratory failure (HCC)    Severe sepsis with septic shock (CODE) (HCC)    Shortness of breath dyspnea    Spinal stenosis, cervical region    SUI (stress urinary incontinence, female)    Tubulo-interstitial nephritis    Type 2 diabetes mellitus (HCC)    Urinary incontinence    Urinary tract infection    Wears glasses    Wound of gluteal cleft    SOCIAL HX:  Social History   Tobacco Use   Smoking status: Never   Smokeless tobacco: Never  Substance Use Topics   Alcohol use: Yes    Alcohol/week: 0.0 standard drinks of alcohol    Comment: 3- 4 times a year   FAMILY HX:  Family History  Problem Relation Age of Onset   Heart Problems Father    Stroke Mother    Hypertension Other    Stroke Other    Breast cancer Sister    Healthy Son       ALLERGIES:  Allergies  Allergen Reactions   Aleve [Naproxen] Hives, Shortness Of Breath and Rash    Pt Avoids All Nsaids   Protonix [Pantoprazole Sodium] Other (See Comments)    Makes her feel wreidd   Zocor [Simvastatin] Other (See Comments)    "weird feeling all over body"     PERTINENT MEDICATIONS:  Outpatient Encounter Medications as of 05/16/2022  Medication Sig   ACETAMINOPHEN PO Take 650 mg by mouth 2 (two) times daily.    amoxicillin-clavulanate (AUGMENTIN) 875-125 MG tablet Take 1 tablet by  mouth every 12 (twelve) hours. (Patient not taking: Reported on 05/10/2022)   aspirin 81 MG chewable tablet Chew 81 mg by mouth daily. (Patient not taking: Reported on 05/10/2022)   azelastine (ASTELIN) 0.1 % nasal spray Place 2 sprays into both nostrils 2 (two) times daily.  (Patient not taking: Reported on 05/10/2022)   baclofen (LIORESAL) 10 MG tablet Take 1 tablet (10 mg total) by mouth 2 (two) times daily.   baclofen (LIORESAL) 10 MG/5ML SOLN injection 5 ml bid   carbidopa-levodopa (SINEMET IR) 25-100 MG tablet Take 2 tabs in the  morning, 1 in the afternoon and 1 in the evening (Patient taking differently: Take 1 tablet by mouth once. Take one tablet at bedtime.)   Cholecalciferol (VITAMIN D PO) Take 5,000 Units by mouth daily. (Patient not taking: Reported on 05/10/2022)   cyanocobalamin 500 MCG tablet Take 500 mcg by mouth daily. (Patient not taking: Reported on 05/10/2022)   diclofenac sodium (VOLTAREN) 1 % GEL Apply 4 g topically 2 (two) times daily.    escitalopram (LEXAPRO) 5 MG tablet Take 5 mg by mouth daily.   fenofibrate 160 MG tablet Take 160 mg by mouth every morning.  (Patient not taking: Reported on 05/10/2022)   fluticasone (FLONASE) 50 MCG/ACT nasal spray Place 2 sprays into both nostrils daily.  (Patient not taking: Reported on 05/10/2022)   gabapentin (NEURONTIN) 100 MG capsule Take 100 mg by mouth at bedtime. Take two capsules at bedtime and 1 capsule at 4 am   hydrochlorothiazide (HYDRODIURIL) 12.5 MG tablet Take 12.5 mg by mouth daily. (Patient not taking: Reported on 05/10/2022)   HYDROcodone-acetaminophen (NORCO/VICODIN) 5-325 MG tablet Take 2 tablets by mouth every 4 (four) hours as needed. (Patient not taking: Reported on 05/10/2022)   loratadine (CLARITIN) 10 MG tablet Take 10 mg by mouth daily.  (Patient not taking: Reported on 05/10/2022)   losartan-hydrochlorothiazide (HYZAAR) 100-12.5 MG tablet Take 1 tablet by mouth daily. (Patient not taking: Reported on 05/10/2022)    Melatonin 10 MG TABS Take 10 mg by mouth at bedtime as needed.  (Patient not taking: Reported on 05/10/2022)   Menthol, Topical Analgesic, (BIOFREEZE EX) Apply topically. (Patient not taking: Reported on 05/10/2022)   montelukast (SINGULAIR) 10 MG tablet Take 10 mg by mouth every evening.  (Patient not taking: Reported on 05/10/2022)   nystatin cream (MYCOSTATIN) Apply 1 application topically 2 (two) times daily.  (Patient not taking: Reported on 05/10/2022)   olmesartan (BENICAR) 40 MG tablet Take 40 mg by mouth daily.   Respiratory Therapy Supplies (FLUTTER) DEVI Use as directed (Patient not taking: Reported on 05/10/2022)   traMADol (ULTRAM) 50 MG tablet Take 50 mg by mouth every 6 (six) hours as needed.   No facility-administered encounter medications on file as of 05/16/2022.   Thank you for the opportunity to participate in the care of Ms. Hoerner.  The palliative care team will continue to follow. Please call our office at [phone] if we can be of additional assistance.   Tamera Pingley, DO ,   COVID-19 PATIENT SCREENING TOOL Asked and negative response unless otherwise noted:  Have you had symptoms of covid, tested positive or been in contact with someone with symptoms/positive test in the past 5-10 days?

## 2022-05-21 NOTE — Progress Notes (Signed)
This encounter was created in error - please disregard.

## 2022-06-12 DEATH — deceased

## 2022-11-16 ENCOUNTER — Telehealth: Payer: Self-pay | Admitting: Neurology

## 2022-11-16 NOTE — Progress Notes (Deleted)
Assessment/Plan:   1.  PSP  -pt has been discharged from hospice care due to exceeding life expectancy.  However, when I looked at her today, she looked very frail and I expect that her life expectancy is much less than 6 months.  Husband and caregiver state that she has dramatically declined over the last few weeks.  She is having trouble swallowing.  She is having trouble feeding.  I am going to send new referral to hospice.  -they want to continue carbidopa/levodopa 25/100, 2/1/1.  However, they admit that she is having significant difficulty with swallowing.  Told them that they can dissolve this in ginger ale.  -They are not interested in PEG tube.  She is DNR.  -Discussed with them issues with feeding.  I am not sure that it is really in her best interest to continue with any type of food.  I am really not sure that it is in her best interest to continue liquids at this time, but will let hospice addressed with patient and family.   2.  Dysphagia and cough, associated with PSP  -Patient has seen Dr. Valeta Harms years ago.  CoughAssist device was ordered for the patient, but patient does not use it.  3.  Sleep apnea  -No longer wearing CPAP.  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  -not sure that botox is valuable and discussed this with them  -increase baclofen to 10 mg bid, although I am not really sure that it would help much.  Given baclofen solution.  -Discussed with them that they should not be putting metal forks and spoons in her mouth, but rather using utensils that we would use for young children.  She could break her teeth.  -PDMP is reviewed.  She is on tramadol, although they state they are not giving it very much.  PDMP reflects that they are giving it, although they do not think she is in pain.  They state that they are backing off on getting it.  -they ask about new PWR WCR.   Discussed with them that insurance would not pay for a new power wheelchair because she would not be  able to utilize it herself.  They felt that they could help her better if they had a power wheelchair to move her around in.   Subjective:   [name] [name] was seen today in follow up for PSP.  My previous records were reviewed prior to todays visit as well as outside records available to me.  Pt with friend [name] and paid caregiver, [name],  who supplements the history.  Last visit, I was very concerned about the patient and felt that she was a hospice candidate.  I referred her to hospice, but did not hear back from them ever again.  Last visit, the patient was having trouble swallowing pills but the patient/family wanted to continue the levodopa.  I told them to dissolve it in ginger ale.  She was also having trouble with food, and I was not sure it was in her best interest to continue eating solids, but wanted to have them address it with hospice.  She was also biting down hard on metal spoons and forks, so we talked about using utensils that we would use for young children so she could not break her teeth.  Current prescribed movement disorder medications: Carbidopa/levodopa 25/100, 2/1/1  Baclofen 10 mg twice per day (increased)    ALLERGIES:   Allergies  Allergen Reactions  Aleve [Naproxen] Hives, Shortness Of Breath and Rash    Pt Avoids All Nsaids   Protonix [Pantoprazole Sodium] Other (See Comments)    Makes her feel wreidd   Zocor [Simvastatin] Other (See Comments)    "weird feeling all over body"    CURRENT MEDICATIONS:  Outpatient Encounter Medications as of 11/20/2022  Medication Sig   ACETAMINOPHEN PO Take 650 mg by mouth 2 (two) times daily.    amoxicillin-clavulanate (AUGMENTIN) 875-125 MG tablet Take 1 tablet by mouth every 12 (twelve) hours. (Patient not taking: Reported on 05/10/2022)   aspirin 81 MG chewable tablet Chew 81 mg by mouth daily. (Patient not taking: Reported on 05/10/2022)   azelastine (ASTELIN) 0.1 % nasal spray Place 2 sprays into both nostrils 2 (two)  times daily.  (Patient not taking: Reported on 05/10/2022)   baclofen (LIORESAL) 10 MG tablet Take 1 tablet (10 mg total) by mouth 2 (two) times daily.   baclofen (LIORESAL) 10 MG/5ML SOLN injection 5 ml bid   carbidopa-levodopa (SINEMET IR) 25-100 MG tablet Take 2 tabs in the morning, 1 in the afternoon and 1 in the evening (Patient taking differently: Take 1 tablet by mouth once. Take one tablet at bedtime.)   Cholecalciferol (VITAMIN D PO) Take 5,000 Units by mouth daily. (Patient not taking: Reported on 05/10/2022)   cyanocobalamin 500 MCG tablet Take 500 mcg by mouth daily. (Patient not taking: Reported on 05/10/2022)   diclofenac sodium (VOLTAREN) 1 % GEL Apply 4 g topically 2 (two) times daily.    escitalopram (LEXAPRO) 5 MG tablet Take 5 mg by mouth daily.   fenofibrate 160 MG tablet Take 160 mg by mouth every morning.  (Patient not taking: Reported on 05/10/2022)   fluticasone (FLONASE) 50 MCG/ACT nasal spray Place 2 sprays into both nostrils daily.  (Patient not taking: Reported on 05/10/2022)   gabapentin (NEURONTIN) 100 MG capsule Take 100 mg by mouth at bedtime. Take two capsules at bedtime and 1 capsule at 4 am   hydrochlorothiazide (HYDRODIURIL) 12.5 MG tablet Take 12.5 mg by mouth daily. (Patient not taking: Reported on 05/10/2022)   HYDROcodone-acetaminophen (NORCO/VICODIN) 5-325 MG tablet Take 2 tablets by mouth every 4 (four) hours as needed. (Patient not taking: Reported on 05/10/2022)   loratadine (CLARITIN) 10 MG tablet Take 10 mg by mouth daily.  (Patient not taking: Reported on 05/10/2022)   losartan-hydrochlorothiazide (HYZAAR) 100-12.5 MG tablet Take 1 tablet by mouth daily. (Patient not taking: Reported on 05/10/2022)   Melatonin 10 MG TABS Take 10 mg by mouth at bedtime as needed.  (Patient not taking: Reported on 05/10/2022)   Menthol, Topical Analgesic, (BIOFREEZE EX) Apply topically. (Patient not taking: Reported on 05/10/2022)   montelukast (SINGULAIR) 10 MG tablet Take 10 mg by  mouth every evening.  (Patient not taking: Reported on 05/10/2022)   nystatin cream (MYCOSTATIN) Apply 1 application topically 2 (two) times daily.  (Patient not taking: Reported on 05/10/2022)   olmesartan (BENICAR) 40 MG tablet Take 40 mg by mouth daily.   Respiratory Therapy Supplies (FLUTTER) DEVI Use as directed (Patient not taking: Reported on 05/10/2022)   traMADol (ULTRAM) 50 MG tablet Take 50 mg by mouth every 6 (six) hours as needed.   No facility-administered encounter medications on file as of 11/20/2022.    Objective:   PHYSICAL EXAMINATION:    VITALS:   There were no vitals filed for this visit.    GEN:  The patient appears stated age and looks a bit uncomfortable HEENT:  Normocephalic,  atraumatic.  The mucous membranes are dry.  Does not follow commands for extraocular muscle testing.  The superficial temporal arteries are without ropiness or tenderness. CV:  RRR Lungs:  CTAB Neck/HEME:  There are no carotid bruits bilaterally.  Neurological examination:  Orientation: The patient is alert.  She sometimes follows simple commands.  She is slipping down in a wheelchair, held up nearly by a gait belt.   Cranial nerves: She occasionally will moan.  The only thing I heard her say intelligibility is moaning the word "okay."  She otherwise does not speak. Sensation: Sensation is intact to light touch throughout Motor: Sometimes, the left leg is held out straight in front of her.  The left arm is behind her back and stuck between her back and the wheelchair.  Movement examination: Tone: Tone is normal in the legs, but she has difficulty with relaxing them. Abnormal movements: No dyskinesia noted today (which likely says she has not gotten medication)  I have reviewed and interpreted the following labs independently    Chemistry      Component Value Date/Time   NA 140 03/17/2022 1815   K 4.1 03/17/2022 1815   CL 108 03/17/2022 1815   CO2 25 03/17/2022 1815   BUN 22  03/17/2022 1815   CREATININE 0.48 03/17/2022 1815      Component Value Date/Time   CALCIUM 9.8 03/17/2022 1815   ALKPHOS 67 03/09/2019 2114   AST 15 03/09/2019 2114   ALT <5 03/09/2019 2114   BILITOT 0.6 03/09/2019 2114       Lab Results  Component Value Date   WBC 5.4 03/17/2022   HGB 12.8 03/17/2022   HCT 39.2 03/17/2022   MCV 92.5 03/17/2022   PLT 220 03/17/2022    Lab Results  Component Value Date   TSH 0.989 03/28/2018     Total time spent on today's visit was *** minutes, including both face-to-face time and nonface-to-face time.  Time included that spent on review of records (prior notes available to me/labs/imaging if pertinent), discussing treatment and goals, answering patient's questions and coordinating care.  Cc:  [name], MD

## 2022-11-16 NOTE — Telephone Encounter (Signed)
Chelsea, we sent referral to [facility] last visit for this patient and I see no notes from them since.  Did they not evaluate her?

## 2022-11-20 ENCOUNTER — Ambulatory Visit: Payer: PPO | Admitting: Neurology
